# Patient Record
Sex: Female | Born: 1945 | Race: White | Hispanic: No | Smoking: Current every day smoker
Health system: Southern US, Community
[De-identification: ages and names within clinical notes are randomized; demographics above are authoritative.]

## PROBLEM LIST (undated history)

## (undated) DIAGNOSIS — G2581 Restless legs syndrome: Secondary | ICD-10-CM

## (undated) DIAGNOSIS — I2510 Atherosclerotic heart disease of native coronary artery without angina pectoris: Secondary | ICD-10-CM

## (undated) DIAGNOSIS — D689 Coagulation defect, unspecified: Secondary | ICD-10-CM

## (undated) DIAGNOSIS — I10 Essential (primary) hypertension: Secondary | ICD-10-CM

## (undated) DIAGNOSIS — Z800 Family history of malignant neoplasm of digestive organs: Secondary | ICD-10-CM

## (undated) DIAGNOSIS — R011 Cardiac murmur, unspecified: Secondary | ICD-10-CM

## (undated) DIAGNOSIS — A419 Sepsis, unspecified organism: Secondary | ICD-10-CM

## (undated) DIAGNOSIS — J449 Chronic obstructive pulmonary disease, unspecified: Secondary | ICD-10-CM

## (undated) DIAGNOSIS — G4733 Obstructive sleep apnea (adult) (pediatric): Secondary | ICD-10-CM

## (undated) DIAGNOSIS — J189 Pneumonia, unspecified organism: Secondary | ICD-10-CM

## (undated) DIAGNOSIS — I209 Angina pectoris, unspecified: Secondary | ICD-10-CM

## (undated) DIAGNOSIS — J9601 Acute respiratory failure with hypoxia: Secondary | ICD-10-CM

## (undated) DIAGNOSIS — G9340 Encephalopathy, unspecified: Secondary | ICD-10-CM

## (undated) DIAGNOSIS — M810 Age-related osteoporosis without current pathological fracture: Secondary | ICD-10-CM

## (undated) DIAGNOSIS — K2100 Gastro-esophageal reflux disease with esophagitis, without bleeding: Secondary | ICD-10-CM

## (undated) DIAGNOSIS — F419 Anxiety disorder, unspecified: Secondary | ICD-10-CM

## (undated) DIAGNOSIS — F32A Depression, unspecified: Secondary | ICD-10-CM

## (undated) DIAGNOSIS — I499 Cardiac arrhythmia, unspecified: Secondary | ICD-10-CM

## (undated) DIAGNOSIS — R0600 Dyspnea, unspecified: Secondary | ICD-10-CM

## (undated) DIAGNOSIS — A4181 Sepsis due to Enterococcus: Secondary | ICD-10-CM

## (undated) DIAGNOSIS — K2980 Duodenitis without bleeding: Secondary | ICD-10-CM

## (undated) DIAGNOSIS — K219 Gastro-esophageal reflux disease without esophagitis: Secondary | ICD-10-CM

## (undated) DIAGNOSIS — F329 Major depressive disorder, single episode, unspecified: Secondary | ICD-10-CM

## (undated) DIAGNOSIS — K5790 Diverticulosis of intestine, part unspecified, without perforation or abscess without bleeding: Secondary | ICD-10-CM

## (undated) DIAGNOSIS — R6520 Severe sepsis without septic shock: Secondary | ICD-10-CM

## (undated) DIAGNOSIS — G9341 Metabolic encephalopathy: Secondary | ICD-10-CM

## (undated) DIAGNOSIS — E785 Hyperlipidemia, unspecified: Secondary | ICD-10-CM

## (undated) DIAGNOSIS — C50919 Malignant neoplasm of unspecified site of unspecified female breast: Secondary | ICD-10-CM

## (undated) DIAGNOSIS — K648 Other hemorrhoids: Secondary | ICD-10-CM

## (undated) DIAGNOSIS — N390 Urinary tract infection, site not specified: Secondary | ICD-10-CM

## (undated) DIAGNOSIS — I219 Acute myocardial infarction, unspecified: Secondary | ICD-10-CM

## (undated) DIAGNOSIS — M1990 Unspecified osteoarthritis, unspecified site: Secondary | ICD-10-CM

## (undated) DIAGNOSIS — M8580 Other specified disorders of bone density and structure, unspecified site: Secondary | ICD-10-CM

## (undated) DIAGNOSIS — R410 Disorientation, unspecified: Secondary | ICD-10-CM

## (undated) DIAGNOSIS — Z87442 Personal history of urinary calculi: Secondary | ICD-10-CM

## (undated) DIAGNOSIS — H269 Unspecified cataract: Secondary | ICD-10-CM

## (undated) DIAGNOSIS — Z789 Other specified health status: Secondary | ICD-10-CM

## (undated) DIAGNOSIS — Z5189 Encounter for other specified aftercare: Secondary | ICD-10-CM

## (undated) DIAGNOSIS — Z923 Personal history of irradiation: Secondary | ICD-10-CM

## (undated) DIAGNOSIS — D126 Benign neoplasm of colon, unspecified: Secondary | ICD-10-CM

## (undated) DIAGNOSIS — E669 Obesity, unspecified: Secondary | ICD-10-CM

## (undated) HISTORY — DX: Hyperlipidemia, unspecified: E78.5

## (undated) HISTORY — DX: Diverticulosis of intestine, part unspecified, without perforation or abscess without bleeding: K57.90

## (undated) HISTORY — PX: CHOLECYSTECTOMY: SHX55

## (undated) HISTORY — PX: SHOULDER ARTHROSCOPY W/ ROTATOR CUFF REPAIR: SHX2400

## (undated) HISTORY — DX: Metabolic encephalopathy: G93.41

## (undated) HISTORY — PX: CARDIAC CATHETERIZATION: SHX172

## (undated) HISTORY — PX: SKIN CANCER EXCISION: SHX779

## (undated) HISTORY — PX: FOOT SURGERY: SHX648

## (undated) HISTORY — DX: Unspecified osteoarthritis, unspecified site: M19.90

## (undated) HISTORY — DX: Other hemorrhoids: K64.8

## (undated) HISTORY — DX: Essential (primary) hypertension: I10

## (undated) HISTORY — DX: Urinary tract infection, site not specified: N39.0

## (undated) HISTORY — DX: Disorientation, unspecified: R41.0

## (undated) HISTORY — DX: Family history of malignant neoplasm of digestive organs: Z80.0

## (undated) HISTORY — DX: Atherosclerotic heart disease of native coronary artery without angina pectoris: I25.10

## (undated) HISTORY — DX: Benign neoplasm of colon, unspecified: D12.6

## (undated) HISTORY — DX: Sepsis, unspecified organism: G93.40

## (undated) HISTORY — DX: Malignant neoplasm of unspecified site of unspecified female breast: C50.919

## (undated) HISTORY — DX: Severe sepsis without septic shock: R65.20

## (undated) HISTORY — PX: CORONARY ANGIOPLASTY WITH STENT PLACEMENT: SHX49

## (undated) HISTORY — DX: Gastro-esophageal reflux disease without esophagitis: K21.9

## (undated) HISTORY — PX: CARPAL TUNNEL RELEASE: SHX101

## (undated) HISTORY — DX: Obesity, unspecified: E66.9

## (undated) HISTORY — PX: FRACTURE SURGERY: SHX138

## (undated) HISTORY — DX: Gastro-esophageal reflux disease with esophagitis, without bleeding: K21.00

## (undated) HISTORY — PX: BONE GRAFT HIP ILIAC CREST: SUR159

## (undated) HISTORY — DX: Duodenitis without bleeding: K29.80

## (undated) HISTORY — DX: Depression, unspecified: F32.A

## (undated) HISTORY — DX: Coagulation defect, unspecified: D68.9

## (undated) HISTORY — DX: Gastro-esophageal reflux disease with esophagitis: K21.0

## (undated) HISTORY — DX: Other specified disorders of bone density and structure, unspecified site: M85.80

## (undated) HISTORY — DX: Unspecified cataract: H26.9

## (undated) HISTORY — DX: Sepsis, unspecified organism: A41.9

## (undated) HISTORY — DX: Age-related osteoporosis without current pathological fracture: M81.0

## (undated) HISTORY — DX: Encounter for other specified aftercare: Z51.89

## (undated) HISTORY — DX: Major depressive disorder, single episode, unspecified: F32.9

---
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
## 1970-11-18 HISTORY — PX: FEMUR FRACTURE SURGERY: SHX633

## 1980-11-18 HISTORY — PX: TUBAL LIGATION: SHX77

## 1995-11-19 DIAGNOSIS — I219 Acute myocardial infarction, unspecified: Secondary | ICD-10-CM

## 1995-11-19 HISTORY — PX: CORONARY ANGIOPLASTY: SHX604

## 1995-11-19 HISTORY — DX: Acute myocardial infarction, unspecified: I21.9

## 1998-07-25 ENCOUNTER — Ambulatory Visit (HOSPITAL_COMMUNITY): Admission: RE | Admit: 1998-07-25 | Discharge: 1998-07-25 | Payer: Self-pay | Admitting: Orthopedic Surgery

## 1998-09-27 ENCOUNTER — Encounter: Admission: RE | Admit: 1998-09-27 | Discharge: 1998-12-18 | Payer: Self-pay | Admitting: Anesthesiology

## 1998-10-02 ENCOUNTER — Ambulatory Visit (HOSPITAL_COMMUNITY): Admission: RE | Admit: 1998-10-02 | Discharge: 1998-10-02 | Payer: Self-pay | Admitting: Obstetrics and Gynecology

## 1998-12-27 ENCOUNTER — Other Ambulatory Visit: Admission: RE | Admit: 1998-12-27 | Discharge: 1998-12-27 | Payer: Self-pay | Admitting: Obstetrics and Gynecology

## 1999-01-25 ENCOUNTER — Emergency Department (HOSPITAL_COMMUNITY): Admission: EM | Admit: 1999-01-25 | Discharge: 1999-01-25 | Payer: Self-pay | Admitting: Emergency Medicine

## 1999-01-25 ENCOUNTER — Encounter: Payer: Self-pay | Admitting: Cardiovascular Disease

## 1999-09-21 ENCOUNTER — Ambulatory Visit (HOSPITAL_COMMUNITY): Admission: RE | Admit: 1999-09-21 | Discharge: 1999-09-22 | Payer: Self-pay | Admitting: Cardiovascular Disease

## 1999-12-17 ENCOUNTER — Encounter: Admission: RE | Admit: 1999-12-17 | Discharge: 1999-12-17 | Payer: Self-pay | Admitting: Orthopedic Surgery

## 1999-12-17 ENCOUNTER — Encounter: Payer: Self-pay | Admitting: Orthopedic Surgery

## 2000-03-07 ENCOUNTER — Other Ambulatory Visit: Admission: RE | Admit: 2000-03-07 | Discharge: 2000-03-07 | Payer: Self-pay | Admitting: Obstetrics and Gynecology

## 2000-04-11 ENCOUNTER — Encounter (INDEPENDENT_AMBULATORY_CARE_PROVIDER_SITE_OTHER): Payer: Self-pay | Admitting: Specialist

## 2000-04-11 ENCOUNTER — Other Ambulatory Visit: Admission: RE | Admit: 2000-04-11 | Discharge: 2000-04-11 | Payer: Self-pay | Admitting: Obstetrics and Gynecology

## 2000-07-11 ENCOUNTER — Encounter (INDEPENDENT_AMBULATORY_CARE_PROVIDER_SITE_OTHER): Payer: Self-pay

## 2000-07-11 ENCOUNTER — Other Ambulatory Visit: Admission: RE | Admit: 2000-07-11 | Discharge: 2000-07-11 | Payer: Self-pay | Admitting: Obstetrics and Gynecology

## 2001-03-13 ENCOUNTER — Encounter: Admission: RE | Admit: 2001-03-13 | Discharge: 2001-03-13 | Payer: Self-pay | Admitting: Rheumatology

## 2001-03-13 ENCOUNTER — Encounter: Payer: Self-pay | Admitting: Rheumatology

## 2001-07-07 ENCOUNTER — Encounter: Payer: Self-pay | Admitting: Cardiovascular Disease

## 2001-07-07 ENCOUNTER — Encounter: Admission: RE | Admit: 2001-07-07 | Discharge: 2001-07-07 | Payer: Self-pay | Admitting: Cardiovascular Disease

## 2001-08-05 ENCOUNTER — Encounter: Admission: RE | Admit: 2001-08-05 | Discharge: 2001-08-05 | Payer: Self-pay | Admitting: Cardiovascular Disease

## 2001-08-14 ENCOUNTER — Encounter: Payer: Self-pay | Admitting: Cardiovascular Disease

## 2001-08-14 ENCOUNTER — Encounter: Admission: RE | Admit: 2001-08-14 | Discharge: 2001-08-14 | Payer: Self-pay | Admitting: Cardiovascular Disease

## 2001-08-21 ENCOUNTER — Encounter: Payer: Self-pay | Admitting: Cardiovascular Disease

## 2001-08-21 ENCOUNTER — Encounter: Admission: RE | Admit: 2001-08-21 | Discharge: 2001-08-21 | Payer: Self-pay | Admitting: Cardiovascular Disease

## 2001-09-11 ENCOUNTER — Encounter: Payer: Self-pay | Admitting: Cardiovascular Disease

## 2001-09-11 ENCOUNTER — Encounter: Admission: RE | Admit: 2001-09-11 | Discharge: 2001-09-11 | Payer: Self-pay | Admitting: Cardiovascular Disease

## 2001-09-22 ENCOUNTER — Encounter: Admission: RE | Admit: 2001-09-22 | Discharge: 2001-09-22 | Payer: Self-pay | Admitting: Neurosurgery

## 2001-09-22 ENCOUNTER — Encounter: Payer: Self-pay | Admitting: Neurosurgery

## 2002-05-03 ENCOUNTER — Encounter: Payer: Self-pay | Admitting: Obstetrics and Gynecology

## 2002-05-03 ENCOUNTER — Encounter: Admission: RE | Admit: 2002-05-03 | Discharge: 2002-05-03 | Payer: Self-pay | Admitting: Obstetrics and Gynecology

## 2002-06-01 ENCOUNTER — Encounter: Payer: Self-pay | Admitting: Internal Medicine

## 2002-06-01 ENCOUNTER — Ambulatory Visit (HOSPITAL_COMMUNITY): Admission: RE | Admit: 2002-06-01 | Discharge: 2002-06-01 | Payer: Self-pay | Admitting: Internal Medicine

## 2002-06-01 DIAGNOSIS — K2100 Gastro-esophageal reflux disease with esophagitis, without bleeding: Secondary | ICD-10-CM | POA: Insufficient documentation

## 2002-06-01 DIAGNOSIS — K222 Esophageal obstruction: Secondary | ICD-10-CM | POA: Insufficient documentation

## 2002-06-01 DIAGNOSIS — K2980 Duodenitis without bleeding: Secondary | ICD-10-CM | POA: Insufficient documentation

## 2002-06-07 ENCOUNTER — Encounter: Payer: Self-pay | Admitting: Internal Medicine

## 2002-07-01 ENCOUNTER — Encounter: Payer: Self-pay | Admitting: Cardiovascular Disease

## 2002-07-01 ENCOUNTER — Observation Stay (HOSPITAL_COMMUNITY): Admission: AD | Admit: 2002-07-01 | Discharge: 2002-07-03 | Payer: Self-pay | Admitting: Cardiovascular Disease

## 2002-07-02 ENCOUNTER — Encounter (INDEPENDENT_AMBULATORY_CARE_PROVIDER_SITE_OTHER): Payer: Self-pay | Admitting: Cardiovascular Disease

## 2002-07-02 ENCOUNTER — Encounter: Payer: Self-pay | Admitting: Cardiovascular Disease

## 2002-09-15 ENCOUNTER — Encounter: Payer: Self-pay | Admitting: Cardiovascular Disease

## 2002-09-15 ENCOUNTER — Encounter: Admission: RE | Admit: 2002-09-15 | Discharge: 2002-09-15 | Payer: Self-pay | Admitting: Cardiovascular Disease

## 2002-11-18 HISTORY — PX: APPENDECTOMY: SHX54

## 2003-02-18 ENCOUNTER — Encounter: Payer: Self-pay | Admitting: Emergency Medicine

## 2003-02-19 ENCOUNTER — Encounter: Payer: Self-pay | Admitting: Emergency Medicine

## 2003-02-19 ENCOUNTER — Encounter (INDEPENDENT_AMBULATORY_CARE_PROVIDER_SITE_OTHER): Payer: Self-pay | Admitting: Specialist

## 2003-02-19 ENCOUNTER — Inpatient Hospital Stay (HOSPITAL_COMMUNITY): Admission: EM | Admit: 2003-02-19 | Discharge: 2003-02-19 | Payer: Self-pay

## 2003-02-19 ENCOUNTER — Emergency Department (HOSPITAL_COMMUNITY): Admission: EM | Admit: 2003-02-19 | Discharge: 2003-02-19 | Payer: Self-pay | Admitting: Emergency Medicine

## 2003-03-22 ENCOUNTER — Inpatient Hospital Stay (HOSPITAL_COMMUNITY): Admission: RE | Admit: 2003-03-22 | Discharge: 2003-03-23 | Payer: Self-pay | Admitting: Neurosurgery

## 2003-03-22 ENCOUNTER — Encounter: Payer: Self-pay | Admitting: Neurosurgery

## 2003-07-29 ENCOUNTER — Ambulatory Visit: Admission: RE | Admit: 2003-07-29 | Discharge: 2003-07-29 | Payer: Self-pay | Admitting: Neurosurgery

## 2003-07-29 ENCOUNTER — Encounter: Payer: Self-pay | Admitting: Neurosurgery

## 2003-08-23 ENCOUNTER — Ambulatory Visit (HOSPITAL_COMMUNITY): Admission: RE | Admit: 2003-08-23 | Discharge: 2003-08-23 | Payer: Self-pay | Admitting: Orthopaedic Surgery

## 2003-08-23 ENCOUNTER — Observation Stay (HOSPITAL_COMMUNITY): Admission: AD | Admit: 2003-08-23 | Discharge: 2003-08-24 | Payer: Self-pay | Admitting: Orthopaedic Surgery

## 2003-08-23 ENCOUNTER — Ambulatory Visit (HOSPITAL_BASED_OUTPATIENT_CLINIC_OR_DEPARTMENT_OTHER): Admission: RE | Admit: 2003-08-23 | Discharge: 2003-08-23 | Payer: Self-pay | Admitting: Orthopaedic Surgery

## 2003-08-30 ENCOUNTER — Encounter: Payer: Self-pay | Admitting: Radiology

## 2003-08-30 ENCOUNTER — Encounter: Admission: RE | Admit: 2003-08-30 | Discharge: 2003-08-30 | Payer: Self-pay | Admitting: Neurosurgery

## 2003-08-30 ENCOUNTER — Encounter: Payer: Self-pay | Admitting: Neurosurgery

## 2003-09-15 ENCOUNTER — Encounter: Admission: RE | Admit: 2003-09-15 | Discharge: 2003-09-15 | Payer: Self-pay | Admitting: Neurosurgery

## 2003-09-27 ENCOUNTER — Encounter: Admission: RE | Admit: 2003-09-27 | Discharge: 2003-09-27 | Payer: Self-pay | Admitting: Obstetrics and Gynecology

## 2003-11-19 HISTORY — PX: LUMBAR LAMINECTOMY: SHX95

## 2003-12-01 ENCOUNTER — Ambulatory Visit (HOSPITAL_COMMUNITY): Admission: RE | Admit: 2003-12-01 | Discharge: 2003-12-01 | Payer: Self-pay | Admitting: Neurosurgery

## 2003-12-19 ENCOUNTER — Other Ambulatory Visit: Admission: RE | Admit: 2003-12-19 | Discharge: 2003-12-19 | Payer: Self-pay | Admitting: Obstetrics and Gynecology

## 2004-01-24 ENCOUNTER — Inpatient Hospital Stay (HOSPITAL_COMMUNITY): Admission: RE | Admit: 2004-01-24 | Discharge: 2004-01-31 | Payer: Self-pay | Admitting: Neurosurgery

## 2004-02-25 ENCOUNTER — Inpatient Hospital Stay (HOSPITAL_COMMUNITY): Admission: EM | Admit: 2004-02-25 | Discharge: 2004-03-02 | Payer: Self-pay | Admitting: Emergency Medicine

## 2004-03-12 ENCOUNTER — Emergency Department (HOSPITAL_COMMUNITY): Admission: EM | Admit: 2004-03-12 | Discharge: 2004-03-12 | Payer: Self-pay | Admitting: Emergency Medicine

## 2004-06-12 ENCOUNTER — Ambulatory Visit (HOSPITAL_BASED_OUTPATIENT_CLINIC_OR_DEPARTMENT_OTHER): Admission: RE | Admit: 2004-06-12 | Discharge: 2004-06-12 | Payer: Self-pay | Admitting: Orthopaedic Surgery

## 2004-06-12 ENCOUNTER — Ambulatory Visit (HOSPITAL_COMMUNITY): Admission: RE | Admit: 2004-06-12 | Discharge: 2004-06-12 | Payer: Self-pay | Admitting: Orthopaedic Surgery

## 2004-10-22 ENCOUNTER — Encounter: Admission: RE | Admit: 2004-10-22 | Discharge: 2004-10-22 | Payer: Self-pay | Admitting: Obstetrics and Gynecology

## 2004-11-18 HISTORY — PX: CERVICAL FUSION: SHX112

## 2004-12-10 ENCOUNTER — Encounter: Admission: RE | Admit: 2004-12-10 | Discharge: 2004-12-10 | Payer: Self-pay | Admitting: Cardiovascular Disease

## 2004-12-24 ENCOUNTER — Other Ambulatory Visit: Admission: RE | Admit: 2004-12-24 | Discharge: 2004-12-24 | Payer: Self-pay | Admitting: Obstetrics and Gynecology

## 2004-12-26 ENCOUNTER — Encounter: Admission: RE | Admit: 2004-12-26 | Discharge: 2004-12-26 | Payer: Self-pay | Admitting: Obstetrics and Gynecology

## 2005-04-18 ENCOUNTER — Ambulatory Visit (HOSPITAL_COMMUNITY): Admission: RE | Admit: 2005-04-18 | Discharge: 2005-04-18 | Payer: Self-pay | Admitting: Cardiovascular Disease

## 2005-04-19 ENCOUNTER — Emergency Department (HOSPITAL_COMMUNITY): Admission: EM | Admit: 2005-04-19 | Discharge: 2005-04-19 | Payer: Self-pay | Admitting: Emergency Medicine

## 2005-08-23 ENCOUNTER — Ambulatory Visit: Payer: Self-pay | Admitting: Internal Medicine

## 2005-09-05 ENCOUNTER — Ambulatory Visit: Payer: Self-pay | Admitting: Internal Medicine

## 2005-09-05 DIAGNOSIS — K5730 Diverticulosis of large intestine without perforation or abscess without bleeding: Secondary | ICD-10-CM | POA: Insufficient documentation

## 2005-12-25 ENCOUNTER — Other Ambulatory Visit: Admission: RE | Admit: 2005-12-25 | Discharge: 2005-12-25 | Payer: Self-pay | Admitting: Obstetrics and Gynecology

## 2006-07-18 ENCOUNTER — Encounter: Admission: RE | Admit: 2006-07-18 | Discharge: 2006-07-18 | Payer: Self-pay | Admitting: Obstetrics and Gynecology

## 2006-11-18 DIAGNOSIS — Z923 Personal history of irradiation: Secondary | ICD-10-CM

## 2006-11-18 DIAGNOSIS — C50919 Malignant neoplasm of unspecified site of unspecified female breast: Secondary | ICD-10-CM

## 2006-11-18 HISTORY — DX: Personal history of irradiation: Z92.3

## 2006-11-18 HISTORY — PX: BREAST BIOPSY: SHX20

## 2006-11-18 HISTORY — DX: Malignant neoplasm of unspecified site of unspecified female breast: C50.919

## 2006-11-18 HISTORY — PX: BREAST LUMPECTOMY: SHX2

## 2007-04-07 ENCOUNTER — Other Ambulatory Visit: Admission: RE | Admit: 2007-04-07 | Discharge: 2007-04-07 | Payer: Self-pay | Admitting: Obstetrics and Gynecology

## 2007-04-24 ENCOUNTER — Encounter (INDEPENDENT_AMBULATORY_CARE_PROVIDER_SITE_OTHER): Payer: Self-pay | Admitting: Dermatology

## 2007-04-30 ENCOUNTER — Encounter: Admission: RE | Admit: 2007-04-30 | Discharge: 2007-04-30 | Payer: Self-pay | Admitting: Obstetrics and Gynecology

## 2007-04-30 ENCOUNTER — Encounter (INDEPENDENT_AMBULATORY_CARE_PROVIDER_SITE_OTHER): Payer: Self-pay | Admitting: Radiology

## 2007-05-03 ENCOUNTER — Encounter: Admission: RE | Admit: 2007-05-03 | Discharge: 2007-05-03 | Payer: Self-pay | Admitting: Obstetrics and Gynecology

## 2007-05-08 ENCOUNTER — Ambulatory Visit (HOSPITAL_COMMUNITY): Admission: RE | Admit: 2007-05-08 | Discharge: 2007-05-08 | Payer: Self-pay | Admitting: Cardiovascular Disease

## 2007-05-11 ENCOUNTER — Encounter: Admission: RE | Admit: 2007-05-11 | Discharge: 2007-05-11 | Payer: Self-pay | Admitting: General Surgery

## 2007-05-21 ENCOUNTER — Ambulatory Visit (HOSPITAL_COMMUNITY): Admission: RE | Admit: 2007-05-21 | Discharge: 2007-05-22 | Payer: Self-pay | Admitting: Surgery

## 2007-05-21 ENCOUNTER — Encounter (INDEPENDENT_AMBULATORY_CARE_PROVIDER_SITE_OTHER): Payer: Self-pay | Admitting: Surgery

## 2007-05-25 ENCOUNTER — Ambulatory Visit: Payer: Self-pay | Admitting: Oncology

## 2007-06-02 ENCOUNTER — Ambulatory Visit: Admission: RE | Admit: 2007-06-02 | Discharge: 2007-08-24 | Payer: Self-pay | Admitting: Radiation Oncology

## 2007-06-03 LAB — LACTATE DEHYDROGENASE: LDH: 138 U/L (ref 94–250)

## 2007-06-03 LAB — CBC WITH DIFFERENTIAL/PLATELET
EOS%: 4.5 % (ref 0.0–7.0)
HCT: 39.6 % (ref 34.8–46.6)
MCH: 31.2 pg (ref 26.0–34.0)
MCHC: 35 g/dL (ref 32.0–36.0)
NEUT%: 67.9 % (ref 39.6–76.8)
RBC: 4.44 10*6/uL (ref 3.70–5.32)
RDW: 12.1 % (ref 11.3–14.5)
lymph#: 2.2 10*3/uL (ref 0.9–3.3)

## 2007-06-03 LAB — COMPREHENSIVE METABOLIC PANEL
Albumin: 3.6 g/dL (ref 3.5–5.2)
Alkaline Phosphatase: 98 U/L (ref 39–117)
BUN: 16 mg/dL (ref 6–23)
CO2: 23 mEq/L (ref 19–32)
Glucose, Bld: 98 mg/dL (ref 70–99)
Potassium: 4.2 mEq/L (ref 3.5–5.3)
Total Bilirubin: 0.4 mg/dL (ref 0.3–1.2)

## 2007-06-03 LAB — CANCER ANTIGEN 27.29: CA 27.29: 12 U/mL (ref 0–39)

## 2007-06-05 ENCOUNTER — Ambulatory Visit (HOSPITAL_COMMUNITY): Admission: RE | Admit: 2007-06-05 | Discharge: 2007-06-05 | Payer: Self-pay | Admitting: Oncology

## 2007-06-10 ENCOUNTER — Ambulatory Visit (HOSPITAL_COMMUNITY): Admission: RE | Admit: 2007-06-10 | Discharge: 2007-06-10 | Payer: Self-pay | Admitting: Oncology

## 2007-08-24 ENCOUNTER — Ambulatory Visit: Admission: RE | Admit: 2007-08-24 | Discharge: 2007-10-27 | Payer: Self-pay | Admitting: Radiation Oncology

## 2007-09-10 ENCOUNTER — Ambulatory Visit: Payer: Self-pay | Admitting: Oncology

## 2007-09-10 LAB — CBC WITH DIFFERENTIAL/PLATELET
BASO%: 1.2 % (ref 0.0–2.0)
HCT: 40.1 % (ref 34.8–46.6)
MCHC: 34.8 g/dL (ref 32.0–36.0)
MONO#: 0.5 10*3/uL (ref 0.1–0.9)
NEUT%: 65.7 % (ref 39.6–76.8)
RDW: 14.4 % (ref 11.3–14.5)
WBC: 9.8 10*3/uL (ref 3.9–10.0)
lymph#: 1.9 10*3/uL (ref 0.9–3.3)

## 2007-09-10 LAB — COMPREHENSIVE METABOLIC PANEL
ALT: 12 U/L (ref 0–35)
AST: 15 U/L (ref 0–37)
CO2: 24 mEq/L (ref 19–32)
Creatinine, Ser: 0.68 mg/dL (ref 0.40–1.20)
Sodium: 140 mEq/L (ref 135–145)
Total Bilirubin: 0.2 mg/dL — ABNORMAL LOW (ref 0.3–1.2)
Total Protein: 5.9 g/dL — ABNORMAL LOW (ref 6.0–8.3)

## 2007-10-13 LAB — LACTATE DEHYDROGENASE: LDH: 143 U/L (ref 94–250)

## 2007-10-13 LAB — COMPREHENSIVE METABOLIC PANEL
Albumin: 3.5 g/dL (ref 3.5–5.2)
BUN: 15 mg/dL (ref 6–23)
CO2: 19 mEq/L (ref 19–32)
Calcium: 8.8 mg/dL (ref 8.4–10.5)
Chloride: 108 mEq/L (ref 96–112)
Creatinine, Ser: 0.7 mg/dL (ref 0.40–1.20)
Potassium: 4.1 mEq/L (ref 3.5–5.3)

## 2007-10-13 LAB — CBC WITH DIFFERENTIAL/PLATELET
Basophils Absolute: 0.1 10*3/uL (ref 0.0–0.1)
Eosinophils Absolute: 0.8 10*3/uL — ABNORMAL HIGH (ref 0.0–0.5)
HCT: 40.2 % (ref 34.8–46.6)
HGB: 14 g/dL (ref 11.6–15.9)
MCH: 31.4 pg (ref 26.0–34.0)
MONO#: 0.4 10*3/uL (ref 0.1–0.9)
NEUT#: 6.7 10*3/uL — ABNORMAL HIGH (ref 1.5–6.5)
NEUT%: 67.1 % (ref 39.6–76.8)
RDW: 14.2 % (ref 11.3–14.5)
WBC: 10 10*3/uL (ref 3.9–10.0)
lymph#: 2 10*3/uL (ref 0.9–3.3)

## 2007-10-18 LAB — VITAMIN D PNL(25-HYDRXY+1,25-DIHY)-BLD: Vit D, 25-Hydroxy: 26 ng/mL — ABNORMAL LOW (ref 30–89)

## 2007-10-28 ENCOUNTER — Ambulatory Visit: Payer: Self-pay | Admitting: Internal Medicine

## 2007-11-17 DIAGNOSIS — K319 Disease of stomach and duodenum, unspecified: Secondary | ICD-10-CM | POA: Insufficient documentation

## 2007-11-17 DIAGNOSIS — E669 Obesity, unspecified: Secondary | ICD-10-CM | POA: Insufficient documentation

## 2007-11-17 DIAGNOSIS — E785 Hyperlipidemia, unspecified: Secondary | ICD-10-CM | POA: Insufficient documentation

## 2007-11-17 DIAGNOSIS — F329 Major depressive disorder, single episode, unspecified: Secondary | ICD-10-CM

## 2007-11-17 DIAGNOSIS — G4730 Sleep apnea, unspecified: Secondary | ICD-10-CM

## 2007-11-17 DIAGNOSIS — D126 Benign neoplasm of colon, unspecified: Secondary | ICD-10-CM | POA: Insufficient documentation

## 2007-11-17 DIAGNOSIS — Z853 Personal history of malignant neoplasm of breast: Secondary | ICD-10-CM | POA: Insufficient documentation

## 2007-11-17 DIAGNOSIS — I2510 Atherosclerotic heart disease of native coronary artery without angina pectoris: Secondary | ICD-10-CM | POA: Insufficient documentation

## 2007-11-17 DIAGNOSIS — K648 Other hemorrhoids: Secondary | ICD-10-CM | POA: Insufficient documentation

## 2007-11-17 DIAGNOSIS — K219 Gastro-esophageal reflux disease without esophagitis: Secondary | ICD-10-CM | POA: Insufficient documentation

## 2007-11-17 DIAGNOSIS — D0590 Unspecified type of carcinoma in situ of unspecified breast: Secondary | ICD-10-CM

## 2007-11-17 DIAGNOSIS — I10 Essential (primary) hypertension: Secondary | ICD-10-CM | POA: Insufficient documentation

## 2007-11-17 DIAGNOSIS — G4733 Obstructive sleep apnea (adult) (pediatric): Secondary | ICD-10-CM | POA: Insufficient documentation

## 2007-11-17 DIAGNOSIS — F32A Depression, unspecified: Secondary | ICD-10-CM | POA: Insufficient documentation

## 2007-11-17 HISTORY — DX: Personal history of malignant neoplasm of breast: Z85.3

## 2008-01-05 ENCOUNTER — Ambulatory Visit: Payer: Self-pay | Admitting: Internal Medicine

## 2008-01-10 ENCOUNTER — Emergency Department (HOSPITAL_COMMUNITY): Admission: EM | Admit: 2008-01-10 | Discharge: 2008-01-10 | Payer: Self-pay | Admitting: Emergency Medicine

## 2008-01-10 ENCOUNTER — Encounter (INDEPENDENT_AMBULATORY_CARE_PROVIDER_SITE_OTHER): Payer: Self-pay | Admitting: *Deleted

## 2008-01-11 ENCOUNTER — Ambulatory Visit: Payer: Self-pay | Admitting: Oncology

## 2008-01-13 LAB — CBC WITH DIFFERENTIAL/PLATELET
Basophils Absolute: 0.1 10*3/uL (ref 0.0–0.1)
Eosinophils Absolute: 0.4 10*3/uL (ref 0.0–0.5)
HGB: 13.6 g/dL (ref 11.6–15.9)
MCV: 89.4 fL (ref 81.0–101.0)
MONO#: 0.6 10*3/uL (ref 0.1–0.9)
MONO%: 4 % (ref 0.0–13.0)
NEUT#: 11 10*3/uL — ABNORMAL HIGH (ref 1.5–6.5)
RDW: 13.9 % (ref 11.3–14.5)
WBC: 14 10*3/uL — ABNORMAL HIGH (ref 3.9–10.0)

## 2008-01-14 LAB — VITAMIN D 25 HYDROXY (VIT D DEFICIENCY, FRACTURES): Vit D, 25-Hydroxy: 35 ng/mL (ref 30–89)

## 2008-01-14 LAB — COMPREHENSIVE METABOLIC PANEL
Albumin: 3.8 g/dL (ref 3.5–5.2)
BUN: 14 mg/dL (ref 6–23)
CO2: 23 mEq/L (ref 19–32)
Calcium: 9.1 mg/dL (ref 8.4–10.5)
Chloride: 106 mEq/L (ref 96–112)
Glucose, Bld: 104 mg/dL — ABNORMAL HIGH (ref 70–99)
Potassium: 4.3 mEq/L (ref 3.5–5.3)
Total Protein: 6.2 g/dL (ref 6.0–8.3)

## 2008-01-14 LAB — CANCER ANTIGEN 27.29: CA 27.29: 12 U/mL (ref 0–39)

## 2008-01-14 LAB — LACTATE DEHYDROGENASE: LDH: 140 U/L (ref 94–250)

## 2008-01-19 ENCOUNTER — Ambulatory Visit: Payer: Self-pay | Admitting: Internal Medicine

## 2008-01-19 LAB — CONVERTED CEMR LAB
Albumin: 3.1 g/dL — ABNORMAL LOW (ref 3.5–5.2)
Alkaline Phosphatase: 99 units/L (ref 39–117)

## 2008-01-25 ENCOUNTER — Ambulatory Visit: Payer: Self-pay | Admitting: Internal Medicine

## 2008-02-22 ENCOUNTER — Encounter (INDEPENDENT_AMBULATORY_CARE_PROVIDER_SITE_OTHER): Payer: Self-pay | Admitting: Surgery

## 2008-02-22 ENCOUNTER — Ambulatory Visit (HOSPITAL_COMMUNITY): Admission: RE | Admit: 2008-02-22 | Discharge: 2008-02-22 | Payer: Self-pay | Admitting: Surgery

## 2008-02-22 ENCOUNTER — Encounter (INDEPENDENT_AMBULATORY_CARE_PROVIDER_SITE_OTHER): Payer: Self-pay | Admitting: *Deleted

## 2008-04-12 ENCOUNTER — Other Ambulatory Visit: Admission: RE | Admit: 2008-04-12 | Discharge: 2008-04-12 | Payer: Self-pay | Admitting: Obstetrics and Gynecology

## 2008-04-14 ENCOUNTER — Emergency Department (HOSPITAL_COMMUNITY): Admission: EM | Admit: 2008-04-14 | Discharge: 2008-04-14 | Payer: Self-pay | Admitting: Emergency Medicine

## 2008-05-02 ENCOUNTER — Encounter: Admission: RE | Admit: 2008-05-02 | Discharge: 2008-05-02 | Payer: Self-pay | Admitting: Oncology

## 2008-05-23 ENCOUNTER — Telehealth: Payer: Self-pay | Admitting: Internal Medicine

## 2008-05-24 ENCOUNTER — Telehealth (INDEPENDENT_AMBULATORY_CARE_PROVIDER_SITE_OTHER): Payer: Self-pay | Admitting: *Deleted

## 2008-06-12 ENCOUNTER — Ambulatory Visit: Payer: Self-pay | Admitting: Oncology

## 2008-06-17 LAB — CBC WITH DIFFERENTIAL/PLATELET
Basophils Absolute: 0 10*3/uL (ref 0.0–0.1)
Eosinophils Absolute: 0.2 10*3/uL (ref 0.0–0.5)
HGB: 13 g/dL (ref 11.6–15.9)
NEUT#: 7.1 10*3/uL — ABNORMAL HIGH (ref 1.5–6.5)
RDW: 14.1 % (ref 11.3–14.5)
lymph#: 1.6 10*3/uL (ref 0.9–3.3)

## 2008-06-20 LAB — COMPREHENSIVE METABOLIC PANEL
AST: 354 U/L — ABNORMAL HIGH (ref 0–37)
Albumin: 3.7 g/dL (ref 3.5–5.2)
BUN: 12 mg/dL (ref 6–23)
Calcium: 8.6 mg/dL (ref 8.4–10.5)
Chloride: 106 mEq/L (ref 96–112)
Glucose, Bld: 119 mg/dL — ABNORMAL HIGH (ref 70–99)
Potassium: 4.3 mEq/L (ref 3.5–5.3)

## 2008-06-20 LAB — VITAMIN D 25 HYDROXY (VIT D DEFICIENCY, FRACTURES): Vit D, 25-Hydroxy: 34 ng/mL (ref 30–89)

## 2008-06-24 ENCOUNTER — Encounter: Payer: Self-pay | Admitting: Internal Medicine

## 2008-06-24 ENCOUNTER — Encounter (INDEPENDENT_AMBULATORY_CARE_PROVIDER_SITE_OTHER): Payer: Self-pay | Admitting: *Deleted

## 2008-06-24 ENCOUNTER — Ambulatory Visit (HOSPITAL_COMMUNITY): Admission: RE | Admit: 2008-06-24 | Discharge: 2008-06-24 | Payer: Self-pay | Admitting: Oncology

## 2008-07-15 ENCOUNTER — Encounter: Payer: Self-pay | Admitting: Internal Medicine

## 2008-07-19 ENCOUNTER — Ambulatory Visit: Payer: Self-pay | Admitting: Internal Medicine

## 2008-07-19 DIAGNOSIS — R740 Nonspecific elevation of levels of transaminase and lactic acid dehydrogenase [LDH]: Secondary | ICD-10-CM

## 2008-07-19 DIAGNOSIS — Z860100 Personal history of colon polyps, unspecified: Secondary | ICD-10-CM

## 2008-07-19 DIAGNOSIS — R7401 Elevation of levels of liver transaminase levels: Secondary | ICD-10-CM | POA: Insufficient documentation

## 2008-07-19 DIAGNOSIS — K259 Gastric ulcer, unspecified as acute or chronic, without hemorrhage or perforation: Secondary | ICD-10-CM | POA: Insufficient documentation

## 2008-07-19 HISTORY — DX: Personal history of colon polyps, unspecified: Z86.0100

## 2008-07-19 HISTORY — DX: Personal history of colonic polyps: Z86.010

## 2008-07-19 LAB — CONVERTED CEMR LAB
A-1 Antitrypsin, Ser: 188 mg/dL (ref 83–200)
ALT: 12 units/L (ref 0–35)
Albumin: 3.1 g/dL — ABNORMAL LOW (ref 3.5–5.2)
Alkaline Phosphatase: 101 units/L (ref 39–117)
Anti Nuclear Antibody(ANA): NEGATIVE
BUN: 17 mg/dL (ref 6–23)
CO2: 29 meq/L (ref 19–32)
Calcium: 8.7 mg/dL (ref 8.4–10.5)
Eosinophils Relative: 5.9 % — ABNORMAL HIGH (ref 0.0–5.0)
Ferritin: 9.9 ng/mL — ABNORMAL LOW (ref 10.0–291.0)
GFR calc Af Amer: 108 mL/min
Glucose, Bld: 95 mg/dL (ref 70–99)
HCT: 39.2 % (ref 36.0–46.0)
Hemoglobin: 13.4 g/dL (ref 12.0–15.0)
INR: 1 (ref 0.8–1.0)
Monocytes Absolute: 0.6 10*3/uL (ref 0.1–1.0)
Monocytes Relative: 5.3 % (ref 3.0–12.0)
Neutro Abs: 8.3 10*3/uL — ABNORMAL HIGH (ref 1.4–7.7)
RBC: 4.46 M/uL (ref 3.87–5.11)
TSH: 1 microintl units/mL (ref 0.35–5.50)
Total Protein: 6.2 g/dL (ref 6.0–8.3)
WBC: 12.2 10*3/uL — ABNORMAL HIGH (ref 4.5–10.5)

## 2008-07-22 ENCOUNTER — Ambulatory Visit: Payer: Self-pay | Admitting: Internal Medicine

## 2008-09-05 ENCOUNTER — Ambulatory Visit: Payer: Self-pay | Admitting: Oncology

## 2008-09-07 ENCOUNTER — Encounter: Payer: Self-pay | Admitting: Internal Medicine

## 2008-09-07 LAB — CBC WITH DIFFERENTIAL/PLATELET
Eosinophils Absolute: 0.4 10*3/uL (ref 0.0–0.5)
MCV: 85.1 fL (ref 81.0–101.0)
MONO%: 5.1 % (ref 0.0–13.0)
NEUT#: 8.2 10*3/uL — ABNORMAL HIGH (ref 1.5–6.5)
RBC: 4.12 10*6/uL (ref 3.70–5.32)
RDW: 15.2 % — ABNORMAL HIGH (ref 11.3–14.5)

## 2008-09-08 LAB — CMP AND LIVER
Albumin: 3.8 g/dL (ref 3.5–5.2)
BUN: 16 mg/dL (ref 6–23)
Bilirubin, Direct: <0.1 mg/dL (ref 0.0–0.3)
CO2: 21 mEq/L (ref 19–32)
Calcium: 9.2 mg/dL (ref 8.4–10.5)
Chloride: 109 mEq/L (ref 96–112)
Creatinine, Ser: 0.65 mg/dL (ref 0.40–1.20)
Glucose, Bld: 120 mg/dL — ABNORMAL HIGH (ref 70–99)

## 2008-09-08 LAB — VITAMIN D 25 HYDROXY (VIT D DEFICIENCY, FRACTURES): Vit D, 25-Hydroxy: 43 ng/mL (ref 30–89)

## 2008-09-08 LAB — GAMMA GT: GGT: 168 U/L — ABNORMAL HIGH (ref 7–51)

## 2008-09-08 LAB — CANCER ANTIGEN 27.29: CA 27.29: 11 U/mL (ref 0–39)

## 2008-09-08 LAB — LACTATE DEHYDROGENASE: LDH: 116 U/L (ref 94–250)

## 2008-10-21 ENCOUNTER — Ambulatory Visit: Payer: Self-pay | Admitting: Oncology

## 2008-10-21 LAB — HEPATIC FUNCTION PANEL
Albumin: 3.7 g/dL (ref 3.5–5.2)
Alkaline Phosphatase: 106 U/L (ref 39–117)
Total Protein: 6.1 g/dL (ref 6.0–8.3)

## 2008-11-17 ENCOUNTER — Ambulatory Visit: Payer: Self-pay | Admitting: Internal Medicine

## 2008-11-17 DIAGNOSIS — K589 Irritable bowel syndrome without diarrhea: Secondary | ICD-10-CM | POA: Insufficient documentation

## 2009-01-03 ENCOUNTER — Encounter (INDEPENDENT_AMBULATORY_CARE_PROVIDER_SITE_OTHER): Payer: Self-pay | Admitting: *Deleted

## 2009-01-23 ENCOUNTER — Ambulatory Visit: Payer: Self-pay | Admitting: Internal Medicine

## 2009-02-10 ENCOUNTER — Ambulatory Visit: Payer: Self-pay | Admitting: Oncology

## 2009-02-14 LAB — CBC WITH DIFFERENTIAL/PLATELET
BASO%: 0.4 % (ref 0.0–2.0)
Eosinophils Absolute: 0.3 10*3/uL (ref 0.0–0.5)
HCT: 33.2 % — ABNORMAL LOW (ref 34.8–46.6)
LYMPH%: 32.8 % (ref 14.0–49.7)
MCHC: 32.4 g/dL (ref 31.5–36.0)
MCV: 80.9 fL (ref 79.5–101.0)
MONO%: 6.1 % (ref 0.0–14.0)
NEUT%: 57.3 % (ref 38.4–76.8)
Platelets: 403 10*3/uL — ABNORMAL HIGH (ref 145–400)
RBC: 4.1 10*6/uL (ref 3.70–5.45)

## 2009-02-15 LAB — CANCER ANTIGEN 27.29: CA 27.29: 16 U/mL (ref 0–39)

## 2009-02-15 LAB — COMPREHENSIVE METABOLIC PANEL
ALT: <8 U/L (ref 0–35)
BUN: 14 mg/dL (ref 6–23)
CO2: 21 mEq/L (ref 19–32)
Calcium: 8.7 mg/dL (ref 8.4–10.5)
Chloride: 111 mEq/L (ref 96–112)
Creatinine, Ser: 0.74 mg/dL (ref 0.40–1.20)
Total Bilirubin: 0.2 mg/dL — ABNORMAL LOW (ref 0.3–1.2)

## 2009-02-15 LAB — VITAMIN D 25 HYDROXY (VIT D DEFICIENCY, FRACTURES): Vit D, 25-Hydroxy: 46 ng/mL (ref 30–89)

## 2009-02-15 LAB — LACTATE DEHYDROGENASE: LDH: 118 U/L (ref 94–250)

## 2009-03-09 ENCOUNTER — Encounter: Payer: Self-pay | Admitting: Internal Medicine

## 2009-03-14 LAB — CBC & DIFF AND RETIC
Basophils Absolute: 0 10*3/uL (ref 0.0–0.1)
Eosinophils Absolute: 0.8 10*3/uL — ABNORMAL HIGH (ref 0.0–0.5)
HGB: 10.9 g/dL — ABNORMAL LOW (ref 11.6–15.9)
IRF: 0.43 — ABNORMAL HIGH (ref 0.130–0.330)
MONO#: 0.5 10*3/uL (ref 0.1–0.9)
NEUT#: 6.2 10*3/uL (ref 1.5–6.5)
RDW: 17.1 % — ABNORMAL HIGH (ref 11.2–14.5)
RETIC #: 75.5 10*3/uL (ref 19.7–115.1)
Retic %: 1.8 % (ref 0.4–2.3)
WBC: 9.7 10*3/uL (ref 3.9–10.3)
lymph#: 2.2 10*3/uL (ref 0.9–3.3)

## 2009-03-14 LAB — IRON AND TIBC
Iron: 15 ug/dL — ABNORMAL LOW (ref 42–145)
UIBC: 307 ug/dL

## 2009-03-14 LAB — MORPHOLOGY: PLT EST: ADEQUATE

## 2009-03-14 LAB — CHCC SMEAR

## 2009-05-03 ENCOUNTER — Encounter: Admission: RE | Admit: 2009-05-03 | Discharge: 2009-05-03 | Payer: Self-pay | Admitting: Oncology

## 2009-05-04 ENCOUNTER — Ambulatory Visit (HOSPITAL_COMMUNITY): Admission: RE | Admit: 2009-05-04 | Discharge: 2009-05-04 | Payer: Self-pay | Admitting: Cardiovascular Disease

## 2009-07-19 ENCOUNTER — Telehealth: Payer: Self-pay | Admitting: Internal Medicine

## 2009-07-19 ENCOUNTER — Ambulatory Visit: Payer: Self-pay | Admitting: Gastroenterology

## 2009-07-19 DIAGNOSIS — F411 Generalized anxiety disorder: Secondary | ICD-10-CM

## 2009-07-19 DIAGNOSIS — M1990 Unspecified osteoarthritis, unspecified site: Secondary | ICD-10-CM | POA: Insufficient documentation

## 2009-07-19 DIAGNOSIS — R1011 Right upper quadrant pain: Secondary | ICD-10-CM | POA: Insufficient documentation

## 2009-07-19 DIAGNOSIS — F419 Anxiety disorder, unspecified: Secondary | ICD-10-CM | POA: Insufficient documentation

## 2009-07-19 DIAGNOSIS — R110 Nausea: Secondary | ICD-10-CM

## 2009-07-19 HISTORY — DX: Nausea: R11.0

## 2009-07-20 LAB — CONVERTED CEMR LAB
ALT: 11 units/L (ref 0–35)
AST: 17 units/L (ref 0–37)
Albumin: 3.3 g/dL — ABNORMAL LOW (ref 3.5–5.2)
Alkaline Phosphatase: 75 units/L (ref 39–117)
Basophils Absolute: 0.1 10*3/uL (ref 0.0–0.1)
Eosinophils Absolute: 0.4 10*3/uL (ref 0.0–0.7)
Glucose, Bld: 83 mg/dL (ref 70–99)
HCT: 36.7 % (ref 36.0–46.0)
Lipase: 18 units/L (ref 11.0–59.0)
Lymphs Abs: 2.5 10*3/uL (ref 0.7–4.0)
MCHC: 33.5 g/dL (ref 30.0–36.0)
MCV: 90.1 fL (ref 78.0–100.0)
Monocytes Absolute: 0.6 10*3/uL (ref 0.1–1.0)
Neutrophils Relative %: 58.3 % (ref 43.0–77.0)
Platelets: 260 10*3/uL (ref 150.0–400.0)
Potassium: 3.9 meq/L (ref 3.5–5.1)
RDW: 14.3 % (ref 11.5–14.6)
Sodium: 143 meq/L (ref 135–145)
Total Bilirubin: 0.3 mg/dL (ref 0.3–1.2)
Total Protein: 5.7 g/dL — ABNORMAL LOW (ref 6.0–8.3)
WBC: 8.5 10*3/uL (ref 4.5–10.5)

## 2009-07-26 ENCOUNTER — Ambulatory Visit (HOSPITAL_COMMUNITY): Admission: RE | Admit: 2009-07-26 | Discharge: 2009-07-26 | Payer: Self-pay | Admitting: Gastroenterology

## 2009-10-16 ENCOUNTER — Ambulatory Visit: Payer: Self-pay | Admitting: Oncology

## 2009-11-16 ENCOUNTER — Ambulatory Visit: Payer: Self-pay | Admitting: Oncology

## 2009-11-21 LAB — CBC WITH DIFFERENTIAL/PLATELET
Basophils Absolute: 0.1 10*3/uL (ref 0.0–0.1)
Eosinophils Absolute: 0.4 10*3/uL (ref 0.0–0.5)
HCT: 41 % (ref 34.8–46.6)
HGB: 13.3 g/dL (ref 11.6–15.9)
MONO#: 0.5 10*3/uL (ref 0.1–0.9)
NEUT%: 58.3 % (ref 38.4–76.8)
WBC: 7.6 10*3/uL (ref 3.9–10.3)
lymph#: 2.2 10*3/uL (ref 0.9–3.3)

## 2009-11-22 LAB — COMPREHENSIVE METABOLIC PANEL
AST: 14 U/L (ref 0–37)
Albumin: 3.8 g/dL (ref 3.5–5.2)
BUN: 16 mg/dL (ref 6–23)
Calcium: 8.9 mg/dL (ref 8.4–10.5)
Chloride: 107 mEq/L (ref 96–112)
Glucose, Bld: 97 mg/dL (ref 70–99)
Potassium: 4.3 mEq/L (ref 3.5–5.3)

## 2009-11-22 LAB — CANCER ANTIGEN 27.29: CA 27.29: 11 U/mL (ref 0–39)

## 2009-11-22 LAB — VITAMIN D 25 HYDROXY (VIT D DEFICIENCY, FRACTURES): Vit D, 25-Hydroxy: 45 ng/mL (ref 30–89)

## 2009-11-28 ENCOUNTER — Ambulatory Visit: Payer: Self-pay | Admitting: Oncology

## 2009-11-28 ENCOUNTER — Encounter: Payer: Self-pay | Admitting: Internal Medicine

## 2009-12-05 ENCOUNTER — Encounter: Admission: RE | Admit: 2009-12-05 | Discharge: 2009-12-05 | Payer: Self-pay | Admitting: Oncology

## 2009-12-15 ENCOUNTER — Emergency Department (HOSPITAL_COMMUNITY): Admission: EM | Admit: 2009-12-15 | Discharge: 2009-12-15 | Payer: Self-pay | Admitting: Emergency Medicine

## 2010-04-14 ENCOUNTER — Emergency Department (HOSPITAL_COMMUNITY): Admission: EM | Admit: 2010-04-14 | Discharge: 2010-04-14 | Payer: Self-pay | Admitting: Family Medicine

## 2010-04-17 ENCOUNTER — Other Ambulatory Visit: Admission: RE | Admit: 2010-04-17 | Discharge: 2010-04-17 | Payer: Self-pay | Admitting: Obstetrics and Gynecology

## 2010-05-09 ENCOUNTER — Encounter: Admission: RE | Admit: 2010-05-09 | Discharge: 2010-05-09 | Payer: Self-pay | Admitting: Cardiovascular Disease

## 2010-05-17 ENCOUNTER — Ambulatory Visit: Payer: Self-pay | Admitting: Oncology

## 2010-05-22 LAB — CBC WITH DIFFERENTIAL/PLATELET
Eosinophils Absolute: 0.2 10*3/uL (ref 0.0–0.5)
MCV: 91.6 fL (ref 79.5–101.0)
MONO#: 0.5 10*3/uL (ref 0.1–0.9)
MONO%: 6.2 % (ref 0.0–14.0)
NEUT#: 3.9 10*3/uL (ref 1.5–6.5)
RBC: 4.41 10*6/uL (ref 3.70–5.45)
RDW: 14.2 % (ref 11.2–14.5)
WBC: 7.3 10*3/uL (ref 3.9–10.3)
lymph#: 2.5 10*3/uL (ref 0.9–3.3)

## 2010-05-22 LAB — COMPREHENSIVE METABOLIC PANEL
AST: 21 U/L (ref 0–37)
Albumin: 4.1 g/dL (ref 3.5–5.2)
Alkaline Phosphatase: 63 U/L (ref 39–117)
Glucose, Bld: 101 mg/dL — ABNORMAL HIGH (ref 70–99)
Potassium: 4.4 mEq/L (ref 3.5–5.3)
Sodium: 140 mEq/L (ref 135–145)
Total Protein: 6.1 g/dL (ref 6.0–8.3)

## 2010-05-22 LAB — CANCER ANTIGEN 27.29: CA 27.29: 12 U/mL (ref 0–39)

## 2010-05-29 ENCOUNTER — Encounter: Payer: Self-pay | Admitting: Internal Medicine

## 2010-06-14 ENCOUNTER — Telehealth: Payer: Self-pay | Admitting: Internal Medicine

## 2010-07-03 ENCOUNTER — Ambulatory Visit: Payer: Self-pay | Admitting: Oncology

## 2010-09-08 ENCOUNTER — Encounter: Admission: RE | Admit: 2010-09-08 | Discharge: 2010-09-08 | Payer: Self-pay | Admitting: Orthopaedic Surgery

## 2010-12-09 ENCOUNTER — Encounter: Payer: Self-pay | Admitting: Obstetrics and Gynecology

## 2010-12-20 NOTE — Letter (Signed)
[facility]   Imported By: Sherian Rein 12/21/2009 07:23:14  _____________________________________________________________________  External Attachment:    Type:   Image     Comment:   External Document

## 2010-12-20 NOTE — Letter (Signed)
[facility]   Imported By: Lennie Odor 07/04/2010 14:27:26  _____________________________________________________________________  External Attachment:    Type:   Image     Comment:   External Document

## 2010-12-20 NOTE — Progress Notes (Signed)
Summary: med concern   Phone Note Call from Patient Call back at [phone]   Caller: Patient Call For: Dr. Marina Goodell Reason for Call: Talk to Nurse Summary of Call: concerned about reactions to "Phosamax" Initial call taken by: Vallarie Mare,  June 14, 2010 4:35 PM  Follow-up for Phone Call        Pt.was put on Fossamax by Dr.Rubin and when pt. read info it says it can cause esophageal stricture and with a hx of stricture she is concerned.She says Dr.Rubin's PA said stop it but Dr.Rubin told her to consult Dr.Rage Beever for advice on this matter. Follow-up by: Teryl Lucy RN,  June 14, 2010 4:43 PM  Additional Follow-up for Phone Call Additional follow up Details #1::        With know esophageal stricture, Fossamax not the best choice. The prescribing Doctor could consider one on the IV formulations (I'm not sure of specifics...out of my area of expetise) Additional Follow-up by: Hilarie Fredrickson MD,  June 15, 2010 8:54 AM    Additional Follow-up for Phone Call Additional follow up Details #2::     Pt. ntfd. of Dr.Maika Mcelveen's rec. re: Lacinda Axon so she will call Dr.Rubin back. Follow-up by: Teryl Lucy RN,  June 15, 2010 10:51 AM

## 2011-01-02 ENCOUNTER — Other Ambulatory Visit: Payer: Self-pay | Admitting: Oncology

## 2011-01-02 ENCOUNTER — Encounter: Payer: Medicare HMO | Admitting: Oncology

## 2011-01-02 LAB — BASIC METABOLIC PANEL
BUN: 11 mg/dL (ref 6–23)
Calcium: 8.8 mg/dL (ref 8.4–10.5)
Creatinine, Ser: 0.67 mg/dL (ref 0.40–1.20)

## 2011-01-03 ENCOUNTER — Encounter (HOSPITAL_BASED_OUTPATIENT_CLINIC_OR_DEPARTMENT_OTHER): Payer: Medicare HMO | Admitting: Oncology

## 2011-01-03 DIAGNOSIS — M899 Disorder of bone, unspecified: Secondary | ICD-10-CM

## 2011-01-11 ENCOUNTER — Other Ambulatory Visit: Payer: Self-pay | Admitting: Dermatology

## 2011-02-13 ENCOUNTER — Telehealth: Payer: Self-pay | Admitting: Internal Medicine

## 2011-02-13 ENCOUNTER — Other Ambulatory Visit: Payer: Self-pay | Admitting: Internal Medicine

## 2011-02-13 NOTE — Telephone Encounter (Signed)
Called patient.  Rx. Already had been sent on 02/05/11 to [facility]

## 2011-02-14 ENCOUNTER — Other Ambulatory Visit: Payer: Self-pay | Admitting: Internal Medicine

## 2011-02-15 MED ORDER — OMEPRAZOLE 40 MG PO CPDR: 40.0000 mg | DELAYED_RELEASE_CAPSULE | Freq: Every day | ORAL | Status: DC

## 2011-02-15 NOTE — Telephone Encounter (Signed)
Rx. resent to pharmacy in Epic.

## 2011-02-15 NOTE — Telephone Encounter (Signed)
Rx. Sent to pharmacy and pt. Notified.

## 2011-04-02 NOTE — Assessment & Plan Note (Signed)
[facility]                         GASTROENTEROLOGY OFFICE NOTE   Martha Mora                         MRN:          [id number]  DATE:10/28/2007                            DOB:          Martha Mora    HISTORY:  This is a pleasant, 65 year old, white female with a history  of coronary artery disease, hypertension, hyperlipidemia,  osteoarthritis, obesity, depression, adenomatous colon polyps,  gastroesophageal reflux disease complicated by peptic stricture  requiring esophageal dilation, and recently diagnosed breast cancer for  which she underwent lumpectomy with axillary node dissection and  subsequent radiation therapy. She was last evaluated in the office  July 07, 2002. At that time, she was on Prevacid for reflux symptoms  and doing well. Unfortunately, do to loss of insurance, she came off of  her proton pump inhibitor. She has been off of the medication for some  time. She has had problems with indigestion and heartburn. As well  pretty significant epigastric pain. She has also recurrent solid food  dysphagia on an intermittent basis. Her last surveillance colonoscopy  was performed September 05, 2005. Though the examination was negative  except for diverticulosis, her prep was fair with a compromised exam.  Followup in 3 years recommended. The patient denies weight loss, melena,  hematochezia, or lower abdominal pain. She is allergic to PENICILLIN.   CURRENT MEDICATIONS:  1. Diclofenac 75 mg b.i.d.  2. Propoxyphene p.r.n.  3. Citalopram 40 mg daily.  4. Pravastatin 40 mg daily.  5. Amlodipine 5 mg daily.  6. Multivitamin.  7. Advil p.r.n.  8. Calcium with D.  9. Aspirin.  10.Arimidex 1 mg daily.   FAMILY HISTORY:  Grandparent colon cancer.   PAST MEDICAL HISTORY:  As above. In addition, sleep apnea.   PAST SURGICAL HISTORY:  1. Tubal ligation.  2. Lumpectomy with axillary node dissection.  3. Appendectomy.   SOCIAL HISTORY:   The patient is married with 2 children. She lives with  her husband. She is retired. She smokes. She does not use alcohol.   REVIEW OF SYSTEMS:  Per diagnostic evaluation form.   PHYSICAL EXAMINATION:  Well-appearing female in no acute distress.  Blood pressure is 152/60, heart rate 62, weight 206.8 pounds. She is 5  feet 2.5 inches in height.  HEENT:  Sclera anicteric. Conjunctiva are pink. Oral mucosa is intact.  No adenopathy.  LUNGS:  Clear.  HEART:  Regular.  ABDOMEN:  Obese and soft without tenderness, mass or hernia. Good bowel  sounds heard.  EXTREMITIES:  Without edema.   IMPRESSION:  1. Gastroesophageal reflux disease. The patient is now experiencing      recurrent dysphagia likely due to recurrent esophageal stricture.  2. Problems with epigastric pain. Most likely due to untreated reflux.      However, she is on multiple nonsteroidal agents and aspirin. She      could be suffering from nonsteroidal antiinflammatory drug      intolerance or possibly an ulcer.  3. History of adenomatous colon polyps. Due for surveillance next      year.   RECOMMENDATIONS:  1.  Initiate proton pump inhibitor therapy in the form of omeprazole 20      mg daily.  2. Schedule upper endoscopy to evaluate pain and esophageal dilation      for relief of dysphagia. The nature of the procedure as well as the      risks, benefits, and alternatives were reviewed in detail. She      understood and agreed to proceed.  3. Keep Plans for surveillance colonoscopy next year.  4. Ongoing general medical care with Dr. Algie Coffer.     Martha Mora. Marina Goodell, MD  Electronically Signed    JNP/MedQ  DD: 10/28/2007  DT: 10/29/2007  Job #: [id number]   cc:   [name], M.D.  Pierce Crane, MD

## 2011-04-02 NOTE — Assessment & Plan Note (Signed)
[facility]                         GASTROENTEROLOGY OFFICE NOTE   Martha Mora, Martha Mora                         MRN:          [id number]  DATE:01/25/2008                            DOB:          June 28, 1946    HISTORY:  Martha Mora presents today for followup.  She is accompanied by her  husband.  She is a 65 year old with multiple medical problems as  previously outlined.  She was seen in the office October 28, 2007 for  reflux disease, dysphagia and epigastric pain.  She subsequently went to  the hospital with severe acute epigastric pain, and was found to have  markedly elevated liver function tests as well as cholelithiasis.  Her  symptoms abated.  She does have surgery appointment with Dr. Jamey Ripa next  week.  She also underwent upper endoscopy January 19, 2008.  She was found  to have a distal esophageal stricture secondary to reflux, as well as a  hiatal hernia.  Also, a small antral ulcer due to nonsteroidal anti-  inflammatory drugs, which she takes regularly for back pain.  She was  prescribed Prilosec.  Helicobacter pylori testing was negative.  Her  stricture was dilated with an 18 mm balloon.  She presents now for  followup.  We also checked repeat liver function tests, which were  entirely normal.  Since her procedure the patient reports resolution of  dysphagia.  No reflux symptoms.  No epigastric pain.  She continues to  take nonsteroidal anti-inflammatory drugs.  She is only taking Prilosec  20 mg daily versus b.i.d. as recommended.   She is allergic to PENICILLIN.   CURRENT MEDICATIONS:  Diclofenac,  Propoxyphene APAP, citalopram,  pravastatin, amlodipine, multivitamin, Advil, aspirin, Arimidex,  omeprazole, and Aleve.   PHYSICAL EXAMINATION:  The patient is alert and oriented and in no acute  distress.  Blood pressure is 132/80, heart rate is 64.  Weight is 205  pounds.  ABDOMEN:  Soft without tenderness, mass or hernia.  Good bowel sounds  heard.   IMPRESSION:  1. Gastroesophageal reflux disease complicated by peptic stricture.      Currently asymptomatic post dilation on Prilosec.  2. Nonsteroidal anti-inflammatory drug induced gastric ulcer.  3. Symptomatic cholelithiasis with transient choledocholithiasis.   RECOMMENDATIONS:  1. Increase Prilosec to 20 mg b.i.d.  2. Reflux precautions with attention to weight loss.  3. Warned retarding the use of multiple nonsteroidal anti-Inflammatory      agents and aspirin.  4. Keep surgical appointment as planned for laproscopic      cholecystectomy with IOC.  5. Surveillance colonoscopy the latter part of this year, as planned,      for a personal history of      colon polyps and family history of colon cancer.  6. Resume general medical care with Dr. Algie Coffer.     Wilhemina Bonito. Marina Goodell, MD  Electronically Signed    JNP/MedQ  DD: 01/25/2008  DT: 01/25/2008  Job #: [id number]   cc:   [name], M.D.  Pierce Crane, MD  Currie Paris, M.D.

## 2011-04-02 NOTE — Op Note (Signed)
NAME:  Martha Mora, NAZARIO NO.:  [id number]   MEDICAL RECORD NO.:  [id number]          PATIENT TYPE:  AMB   LOCATION:  DAY                          FACILITY:  [facility]   PHYSICIAN:  Currie Paris, M.D.DATE OF BIRTH:  1946/04/07   DATE OF PROCEDURE:  02/22/2008  DATE OF DISCHARGE:                               OPERATIVE REPORT   CCS#:  [id number].   PREOPERATIVE DIAGNOSES:  Gallstones.   POSTOPERATIVE DIAGNOSES:  Gallstones.   OPERATION:  Laparoscopic cholecystectomy with operative cholangiogram.   SURGEON:  Dr. Jamey Ripa.   ASSISTANT:  Dr. Lurene Shadow   ANESTHESIA:  General.   CLINICAL HISTORY:  This is a 65 year old lady whose been having  intermittent problems with GI symptoms.  She was known to have both  gallstones and an esophageal stricture.  The stricture had been managed  by Dr. Marina Goodell.  She continued to have some biliary type symptoms and we  elected to proceed to cholecystectomy.   DESCRIPTION OF PROCEDURE:  The patient was seen in the holding area and  she had no further questions.  We confirmed that removal of the  gallbladder was the planned procedure.   The patient was taken to the operating room and after satisfactory  general anesthesia had been obtained, the abdomen was prepped and  draped.  The time-out was performed.   0.25% plain Marcaine was used for each incision.  An umbilical incision  was made, the fascia entered and the peritoneal cavity entered under  direct vision.  A few omental adhesions to the umbilicus were broken up  and the [facility] introduced and the abdomen insufflated to 15.   No obvious abnormalities were seen other than a number of adhesions to  the gallbladder.   Three additional trocars were placed in the usual positions using a  10/11 in the epigastrium and two 5 mm laterally all under direct vision.   The gallbladder was retracted over the liver.  The omental adhesions  were taken down with cautery.  When I got down to  the neck of the  gallbladder, I was able to retract it out laterally and opened the  peritoneum and developed a nice window between the cystic duct and  cystic artery and gallbladder opening and exposing the triangle of  Calot.   I put a clip on the cystic artery near its origin and one on the cystic  duct near the gallbladder.  The cystic duct was opened and operative  angiography done using a Cook catheter placed percutaneously.  This  showed a fairly long cystic duct with possibly an intramural component,  filling of the hepatic radicals and duct and duodenum with no obvious  filling defects.   The catheter was removed and three clips placed on the stay side of  cystic duct that was divided.  Three additional clips were placed on the  cystic artery and it was divided leaving three clips on the stay side.  Another branch was clipped and divided and the gallbladder removed from  below to above.  There was some bile spillage. It was  a very thin,  somewhat intrahepatic gallbladder.  Once it was disconnected, I put it  in a bag.  We brought it out the umbilical site. We reinsufflated and  spent several minutes making sure the bed of the gallbladder was dry.  I  left a little Surgicel where there had been some oozing but everything  was dry at the end of the case.   Lateral ports removed under direct vision.  The umbilical site was  closed with the pursestring placed at the beginning of the case. The  abdomen was deflated through the epigastric port.  The skin was closed  with 4-0 Monocryl subcuticular plus Dermabond.   The patient tolerated the procedure well.  There were no operative  complications.  All counts were correct.      Currie Paris, M.D.  Electronically Signed     CJS/MEDQ  D:  02/22/2008  T:  02/22/2008  Job:  [id number]   cc:   [name], M.D.  Fax: [phone]   Artist Pais, M.D.  Fax: [phone]   Wilhemina Bonito. Marina Goodell, MD  [address]   [place]  [place] [address]

## 2011-04-02 NOTE — Op Note (Signed)
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   OPERATION:  Right partial mastectomy with axillary dissection.   SURGEON:  Currie Paris, M.D.   ASSISTANT:  Ollen Gross. Vernell Morgans, M.D.   ANESTHESIA:  General.   CLINICAL HISTORY:  Ms. Savell is a 61-year lady who apparently has had an  accessory nipple right at the inframammary fold.  This developed some  abnormality of the skin and the biopsy shows invasive carcinoma, breast  in origin, with lesion of about 1.5 cm by ultrasound and about 2.5 by  MRI.  She also had an axillary nodes that had been biopsied and was  positive.  After a lengthy discussion with the patient, she was  scheduled for a right partial mastectomy with axillary dissection.  Because of the location, she knew there would be some cosmetic issues  that might need further work because of difficulty excising this and  keeping a normal looking breast.   DESCRIPTION OF PROCEDURE:  The patient was seen in the holding area and  she had no further questions.  I identified and marked the right breast  as the operative side.   The patient was taken to the operating room. After satisfactory general  endotracheal anesthesia had been obtained, the right breast and axillary  area were prepped and draped as a sterile field.  The time out occurred.  As noted, the mass and skin abnormality were located in the inframammary  fold about the 5 o'clock position.  I looked at both a diamond shaped  somewhat radial incision going across the inframammary fold as well as  an elliptical one going transversely and taking out the inframammary  fold.  I thought the latter would pull the breast down too much and  produce more deformity.   I, therefore, made an elliptical incision going well around the mass.  I  divided the subcu tissues down to the chest wall and then completely  excised this doing an, in essence, partial mastectomy to include skin,  subcu, breast, and fascia.  In order to close this, I raised skin flaps  of the superior or breast portion of the incision as well as some skin  subcutaneous flaps below the breast and lower flaps.  I then extended  the incision along the inframammary fold from the corners of the diamond  shaped initial incision line going both medially and laterally.  This  then allowed me to close the skin from the superior corner of the  incision down towards the new inframammary fold.  I was able to, by  mobilizing more skin, then close  the new inframammary fold in the usual  direction and there was a little bit of closure of the inferior corner  of the incision.  I used a combination of staples as well as sutures.  There was going to be one corner where the four edges came together that  was likely to have some vascular ischemic changes, but I felt we had  good closure and everything appeared to be healthy.  Prior to closing, I  placed a 19 Blake drain and secured it with 3-0 nylon.   The axillary incision was made and the subcu tissues divided.  The  axillary contents were divided down to the chest wall with clips used on  some vessels.  The axilla was opened off of the clavipectoral fascia and  stripped from superior to inferior and medial to lateral.  There was a  very superficial nerve going out transversely which I avoided.  I do not  know if this was a very high intercostal brachial nerve or some aberrant  portion of the brachial plexus, but this  was cautiously avoided.  There  was what appeared to be at least one positive node just at the axillary  vein.  This was carefully dissected off.  Another large but soft node  was found at the superior end of the latissimus dorsi, so that was  somewhat lateral.  However, I  removed all the intervening tissue.  I  identified and preserved the long thoracic and thoracodorsal nerves and  we checked those by pinching at the end of case to make sure they  worked.  I primarily used the cautery and clips for hemostasis.  Once I  had the axillary contents removed and we trimmed out a little bit of  fatty tissue that was left behind to make sure we had good clearance, I  carefully palpated and made sure we felt no other adenopathy.  Everything else appeared to be fine.   The incision was irrigated and appeared dry.  I placed a 19 Blake drain  and then closed the incision with staples.  The patient tolerated the  procedure well.  There no operative complications.  All counts were  correct.      Currie Paris, M.D.  Electronically Signed     CJS/MEDQ  D:  05/21/2007  T:  05/22/2007  Job:  [id number]   cc:   [name], M.D.  Fax: [phone]   Artist Pais, M.D.  Fax: [phone]

## 2011-04-05 NOTE — Op Note (Signed)
NAME:  Martha Mora, Martha Mora NO.:  [id number]   MEDICAL RECORD NO.:  [id number]                   PATIENT TYPE:  INP   LOCATION:  5006                                 FACILITY:  [facility]   PHYSICIAN:  Lubertha Basque. Jerl Santos, M.D.             DATE OF BIRTH:  12-Jul-1946   DATE OF PROCEDURE:  08/23/2003  DATE OF DISCHARGE:                                 OPERATIVE REPORT   PREOPERATIVE DIAGNOSES:  1. Left shoulder impingement.  2. Left shoulder partial rotator cuff tear.   POSTOPERATIVE DIAGNOSIS:  1. Left shoulder impingement.  2. Left shoulder partial rotator cuff tear.   PROCEDURE:  1. Left shoulder arthroscopic acromioplasty.  2. Left shoulder arthroscopic debridement.   ANESTHESIA:  General.   ATTENDING SURGEON:  Lubertha Basque. Jerl Santos, M.D.   ASSISTANT:  Lindwood Qua, P.A.   INDICATIONS FOR PROCEDURE:  The patient is a 65 year old woman with a long  history of left shoulder pain. She has achieved transient relief with  injections many times in the past. She has an unfavorable shape to her  acromion with pain at rest and pain with activity. She was offered  arthroscopy. Informed operative consent was obtained after a discussion of  the possible  complications of reaction to the anesthesia and infection.   DESCRIPTION OF PROCEDURE:  The patient was taken to the operating suite  where general anesthetic was applied without difficulty. She was positioned  in the beachchair position and prepped and draped in the normal sterile  fashion. After injection of preoperative IV antibiotics and arthroscopy of  the left shoulder was undertaken through 2 portals.   The glenohumeral joint showed no degenerative change and the biceps and  rotator cuff appeared  benign from below. In the subacromial space she had a  great deal of bursitis, addressed with a thorough debridement. She did have  a partial thickeners rotator cuff tear which she also debrided  and was  directly under the tip of the acromion.   An acromioplasty was done with the bur in the lateral position followed  by  transfer of the bur to the posterior position. This transformed her acromial  morphology from a type 3 to a type 1 flat shape.   The shoulder was thoroughly irrigated at the end of the case followed  by  placement  of Marcaine with epinephrine and morphine. Simple sutures of  nylon were used to loosely reapproximate  the portals followed  by Adaptic  and a dry  gauze dressing  with decreased appetite.   Estimated blood loss was and interoperative fluids may be obtained from  anesthesia records. The patient was extubated in the operating room and  taken to the recovery room in stable condition. Plans were for her to be  admitted to the hospital for overnight observation due to poorly controlled  sleep apnea. We will discharge  her home in  the morning if all is well.                                               Lubertha Basque Jerl Santos, M.D.    PGD/MEDQ  D:  08/23/2003  T:  08/23/2003  Job:  [id number]

## 2011-04-05 NOTE — Op Note (Signed)
NAME:  Martha Mora, Martha Mora                            ACCOUNT NO.:  [id number]   MEDICAL RECORD NO.:  [id number]                   PATIENT TYPE:  INP   LOCATION:  3012                                 FACILITY:  [facility]   PHYSICIAN:  Hilda Lias, M.D.                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   POSTOPERATIVE DIAGNOSIS:  Degenerative disc disease with lumbar stenosis, L3-  L4, L4-L5, and L5-S1, foraminal stenosis L5-S1, spondylolisthesis.   PROCEDURE:  Bilateral L3, L4, L5 laminectomy, foraminotomy, posterolateral  fusion from L3 to S1 with allograft and autograft, Cell Saver.   SURGEON:  Hilda Lias, M.D.   ASSISTANT:  Danae Orleans. Venetia Maxon, M.D.   CLINICAL HISTORY:  Martha Mora is a lady who underwent anterior cervical  discectomy several months ago.  This lady, in the past, has had hip surgery.  Lately, she has been complaining of back pain radiating down both legs, left  worse than the right one.  X-rays show severe degenerative disc disease at  the level of 2-3, 3-4, and 4-5, with stenosis, hypertrophy of the facet, and  spondylolisthesis between L5 and S1 which did not move between flexion or  extension.  The patient has failed with conservative treatment.  Surgery was  advised.  The idea was to go ahead and because she has some weakness in  dorsiflexion in both feet, to proceed with laminectomy and interbody fusion  at the level of 4-5 but she knew that we might be involved in 3-4 and the  surgical procedure would change according to the findings during surgery.  The risks were explained during the history and physical.   PROCEDURE:  The patient was taken to the OR and a midline incision from L2  to L5 was made.  The muscles and fascia were  retracted laterally until we  were able to find the transverse process of L3, L4, and L5.  Indeed, the  patient has quite a bit of hypertrophy of the facet with scoliosis with  convexity to the left side.  As we planned, we went ahead and removed the  spinous process and lamina of L3, L4, and L5.  A thick, yellow ligament was  also removed.  Using the Kerrison punch as well as the drill, we drilled the  medial facet bilaterally.  The yellow ligament also was removed.  Using the  2, 3, and 4 Kerrison punch, we did a decompression of the L3, L4, L5, and S1  nerve roots.  We pulled the sacrum and, indeed, there was no room whatsoever  between L5-S1.  The area of more interest was 4-5 and, indeed, after we did  foraminotomy, there was  plenty of space.  There was some disc in 3-4, 4-5,  and 5-1, with a step off at the level of 5-1 which was stable.  Because of  that, after I consulted with Dr. Venetia Maxon, we decided not to do any type of  interbody position.  I think that we, during the surgery, were able to  accomplish decompression of the dural sac as well as the foramen.  Then, we  removed the periosteum of 3, 4, 5, and the ala of the sacrum.  Using a mix  of bone graft, autograft and allograft, we filled up the space with this  mixture.  X-rays showed, indeed,  the tip of the probe was at the level of L3 and the level of L5-S1.  We  probed L3 and it was wide open.  The area was irrigated.  BioGlue was left  in the area where the dura matter was really thin.  Then, the muscle was  closed with Vicryl and the skin with Steri-Strips.  The patient did well.                                               Hilda Lias, M.D.    EB/MEDQ  D:  01/24/2004  T:  01/24/2004  Job:  [id number]

## 2011-04-05 NOTE — Discharge Summary (Signed)
NAME:  Martha Mora, Martha Mora NO.:  [id number]   MEDICAL RECORD NO.:  [id number]                   PATIENT TYPE:  INP   LOCATION:  3041                                 FACILITY:  [facility]   PHYSICIAN:  Hilda Lias, M.D.                DATE OF BIRTH:  [birth year]   DATE OF ADMISSION:  02/25/2004  DATE OF DISCHARGE:  03/02/2004                                 DISCHARGE SUMMARY   ADMISSION DIAGNOSIS:  Rule out epidural infection after lumbar fusion.   FINAL DIAGNOSIS:  Lumbar infection.   CLINICAL HISTORY:  The patient was admitted to the hospital on February 25, 2004  by Dr. Delma Officer because of headache and fever.  The patient had a spinal  tap by Dr. Lovell Sheehan which showed increased white cells with decreased  glucose.  Because of that, the patient was admitted to 3000 to rule out the  possibility of infection in the lumbar area and/or meningitis.   LABORATORY DATA:  At the moment on discharge, all the cultures are negative.  The patient had increase of protein and low glucose in the CSF.  White cells  were within normal limits.   The patient was seen by infectious disease and she was started on  antibiotics.  Nevertheless, all the cultures were essentially negative and  at the end she was discharged on p.o. doxycycline twice a day for four  weeks.   CONDITION ON DISCHARGE:  Improving.   MEDICATIONS:  1. Doxycycline.  2. Percocet.  3. Flexeril.   DIET:  Regular.   ACTIVITY:  She is not to drive.  She is not to do any lifting.   FOLLOW UP:  She was to see me in my office in four weeks.                                                Hilda Lias, M.D.    EB/MEDQ  D:  03/30/2004  T:  03/31/2004  Job:  [id number]

## 2011-04-05 NOTE — Op Note (Signed)
NAME:  Martha Mora, Martha Mora NO.:  [id number]   MEDICAL RECORD NO.:  [id number]                   PATIENT TYPE:  INP   LOCATION:  3041                                 FACILITY:  [facility]   PHYSICIAN:  Hilda Lias, M.D.                DATE OF BIRTH:  1945-12-13   DATE OF PROCEDURE:  02/29/2004  DATE OF DISCHARGE:                                 OPERATIVE REPORT   PREOPERATIVE DIAGNOSIS:  Rule out infection in lumbar area.  Fluid  collection in the epidural space.   POSTOPERATIVE DIAGNOSIS:  Rule out infection in lumbar area.  Fluid  collection in the epidural space.   OPERATION PERFORMED:  Drainage with a needle, #18 gauge lumbar needle.  Specimen to the laboratory for culture.   SURGEON:  Hilda Lias, M.D.   ANESTHESIA:  Local.   INDICATIONS FOR PROCEDURE:  [name] was admitted because of fever.  The  patient has a test on the fluid which nothing grew out.  I have been talking  to infectious disease, Rockey Situ. Roxan Hockey, M.D.,  who has been helping in  the care of [name].  At the present time the patient has no fever, no  stiffness of the neck and so far the first culture showed no evidence of any  infection.  MRI done yesterday showed some fluid in the epidural space.  We  were talking about drainage with a needle in the OR.  Dr. Roxan Hockey agreed  with removal of fluid, sent to laboratory and if there is evidence of  infection, to go ahead and do open drainage.   DESCRIPTION OF PROCEDURE:  The patient was taken to PACU and the wound was  cleaned with Betadine.  Infiltration of the skin with Xylocaine was made.  Then an 18 gauge needle was inserted.  About approximately 25 mL of  yellowish type of fluid was aspirated.  Specimen was sent to laboratory for  protein, cell count, Gram culture aerobic and anaerobic.  After the  procedure, a Band-Aid was placed on the area.  The patient will go back  again to her room.                     Hilda Lias, M.D.    EB/MEDQ  D:  02/29/2004  T:  03/01/2004  Job:  [id number]

## 2011-04-05 NOTE — Cardiovascular Report (Signed)
NAME:  Martha Mora, Martha Mora NO.:  [id number]   MEDICAL RECORD NO.:  [id number]          PATIENT TYPE:  OIB   LOCATION:  2899                         FACILITY:  [facility]   PHYSICIAN:  [name], M.D.  DATE OF BIRTH:  June 28, 1946   DATE OF PROCEDURE:  04/18/2005  DATE OF DISCHARGE:                              CARDIAC CATHETERIZATION   PROCEDURE:  Left heart catheterization, selective coronary angiography, left  ventricular function study.   INDICATIONS:  This 65 year old white female with coronary artery disease and  a history of smoking, hypertension, and hyperlipidemia had chest pain with  abnormal EKG.   APPROACH:  Right femoral artery using 5 French sheath and catheters.  For  right coronary artery visualization, left Amplatz catheter was required due  to anterior takeoff.   COMPLICATIONS:  None.   HEMODYNAMIC DATA:  The left ventricular pressure was 157/15, and aortic  pressure 150/68.   CORONARY ANATOMY:  The left main coronary artery was unremarkable.   Left anterior descending coronary artery.  The left anterior descending  coronary artery had a proximal 40%, followed by 30-40% lesion in the  postseptal perforator region.  The midvessel stent was patent, and there was  mild diffuse narrowing of the distal vessel.   Diagonal 1 vessel had ostial 20-30% narrowing, followed by a small  aneurysmal dilatation and mild diffuse distal disease.  Diagonal 2 was  unremarkable.  Diagonal 3, 4, and 5 were very small vessels.   Left circumflex coronary artery.  The left circumflex coronary artery had  luminal irregularities.  The obtuse marginal branch was unremarkable.   Right coronary artery.  The right coronary artery had anterior takeoff, was  dominant, and had proximal 20-30% lesions x 3.  Midvessel had 30-40% lesion,  and then luminal irregularities.  It had a large posterolateral branch with  a mild lesion in the proximal half of the vessel.  Also had a  large marginal  branch, and a small posterior descending coronary artery was apparently  unremarkable.   Left ventriculogram.  The left ventriculogram showed mild anterior and  lateral wall hypokinesia, with ejection fraction of 55%.   IMPRESSION:  1.  Mild to moderate three-vessel coronary artery disease.  2.  Patent left anterior descending coronary artery stent site.  3.  Mild left ventricular systolic dysfunction.   RECOMMENDATIONS:  This patient will be treated medically, with emphasis on  smoking cessation, including counseling, and restarting Lipitor or Pravachol  and antihypertensive medications.       ASK/MEDQ  D:  04/18/2005  T:  04/18/2005  Job:  [id number]

## 2011-04-05 NOTE — Discharge Summary (Signed)
NAME:  Martha Mora, Martha Mora NO.:  [id number]   MEDICAL RECORD NO.:  [id number]                   PATIENT TYPE:  INP   LOCATION:  3012                                 FACILITY:  [facility]   PHYSICIAN:  Hilda Lias, M.D.                DATE OF BIRTH:  1946/03/27   DATE OF ADMISSION:  01/24/2004  DATE OF DISCHARGE:  01/31/2004                                 DISCHARGE SUMMARY   ADMISSION DIAGNOSIS:  Degenerative disc disease L3-4, L4-L5, L5-S1 with L5-  S1 spondylolisthesis.   DISCHARGE DIAGNOSIS:  Degenerative disc disease L3-4, L4-L5, L5-S1 with L5-  S1 spondylolisthesis.   CLINICAL HISTORY:  The patient was admitted because of back pain with  radiation down to both legs.  Several months ago, I did an anterior cervical  discectomy.  The patient has been complaining of pain to both legs.  The  patient has a severe case of degenerative disc disease at multiple levels  and she wants to proceed with surgery.   LABORATORY DATA:  Within normal limits.   HOSPITAL COURSE:  The patient was taken to surgery and a bilateral 3, 4, 5  laminectomy and foraminotomy was done.  Although  we were trying to go ahead  and set pedicle screw at the level of 4-5, we realized that she was really  stable.  We decided not to.  Nevertheless, using allograft and autograft we  did a posterolateral fusion from L3 to S1.  The patient did really well.  She was having some incisional pain.  About four days she started draining  some arterial fluid associated with headache.  Clinically, the patient had a  CSF leak.  She was positioned supine with the head down.  Nevertheless, she  was able to get out of the bed on her own despite our advise not to.   After surgery as I mentioned above, she did well ____________ sort of flat  in bed. She today is going great.  The wound is absolutely dry.  She has no  headache.  She has no fever and she wants to go home.  She is being  discharged today to  be followed by me next Thursday, February 09, 2004.   MEDICATIONS:  1. Keflex.  2. Neurontin.  3. Demerol.  4. Diazepam.   DIET:  Regular.   ACTIVITY:  The patient was encouraged not to smoke and she was encouraged  not to do any heavy lifting.   FOLLOW UP:  She will be seen by me on February 09, 2004.  If she develops any  fever, headache, or fluid from the wound, she is to call me at my office.  Hilda Lias, M.D.   EB/MEDQ  D:  01/31/2004  T:  02/02/2004  Job:  [id number]

## 2011-04-05 NOTE — H&P (Signed)
NAME:  KLEE, KOLEK NO.:  [id number]   MEDICAL RECORD NO.:  [id number]                   PATIENT TYPE:  INP   LOCATION:  3012                                 FACILITY:  [facility]   PHYSICIAN:  Hilda Lias, M.D.                DATE OF BIRTH:  Jan 29, 1946   DATE OF ADMISSION:  01/24/2004  DATE OF DISCHARGE:                                HISTORY & PHYSICAL   HISTORY:  The patient is a lady who in the past underwent a decompressive  laminectomy in the neck and later on a lumbar procedure.  I have been seeing  this lady for many years in my office, and she has been complaining of back  pain with radiation down to both legs, the left one worse than the right  one.  The patient had conservative treatment.  She had been seen by an  orthopedic surgeon for the right shoulder, and also for the carpal tunnel  syndrome.  Never-the-less, she is not any better.  She has failed  conservative treatment, including epidural injection.  The pain has been  down to both legs, although the left one is worse than the right one.  The  patient had x-rays, including a myelogram, and because of the findings, she  wants to proceed with surgery.   PAST MEDICAL/SURGICAL HISTORY:  1. A C-section.  2. Foot surgery.  3. Left leg surgery.  4. Prior cervical diskectomy and lumbar decompression.   FAMILY HISTORY:  Unremarkable.   SOCIAL HISTORY:  The patient does not drink.  She smokes.   PHYSICAL EXAMINATION:  HEENT:  Normal.  NECK:  There is a scar on the neck from previous surgery.  She is able to  flex with some difficulty with lateralization.  CARDIOVASCULAR:  Normal.  LUNGS:  There are some mild rhonchi bilaterally.  HEART:  Sounds normal.  ABDOMEN:  Normal.  NEUROLOGIC:  Mental status normal.  Cranial nerves normal.  Strength is 5/5  with some weakness of dorsiflexion of the feet.  The straight leg raising is  positive bilaterally at about 45 degrees.  The lumbar spine  x-rays show severe kyphoscoliosis with convexity of the L3-  4 and L4-5 with degenerative changes that go all the way down from L2-3, L4-  5, with a step-off of 5.0 mm between L5 and S1, which does not move with  flexion and extension.   CLINICAL IMPRESSION:  1. Degenerative disk disease with multiple levels compromised, being the     worst at the level of L4-5.  2. She has some degenerative spondylolisthesis between L5-S1, which does not     move with flexion or extension.   RECOMMENDATIONS:  The patient is being admitted for surgery.  The procedure  will be a bilateral L2, L3, L-4 and L5 laminectomy and will proceed with a  total diskectomy at the level of L4-5, and  with an interbody fusion and  pedicle screws.  A decision involving L3-4 and/or L5-S1 will be made during  surgery.  The patient  and her husband were in my office, and the surgical  situation was fully explained to them.  They know the risks of the surgery,  such as infection, failure of the bone graft because of the history of  smoking, failure of the screws, damage  to the nerves and spinal cord, damage to the vessels of the abdomen, injury  to the nerve root, and no improvement whatsoever of the pain, further need  for surgery, and especially worsening of the pain.  The patient declined  another opinion.                                                Hilda Lias, M.D.    EB/MEDQ  D:  01/24/2004  T:  01/24/2004  Job:  [id number]

## 2011-04-05 NOTE — H&P (Signed)
NAME:  Martha Mora, Martha Mora NO.:  [id number]   MEDICAL RECORD NO.:  [id number]                   PATIENT TYPE:  INP   LOCATION:  3041                                 FACILITY:  [facility]   PHYSICIAN:  Cristi Loron, M.D.            DATE OF BIRTH:  05-10-1946   DATE OF ADMISSION:  02/25/2004  DATE OF DISCHARGE:                                HISTORY & PHYSICAL   CHIEF COMPLAINT:  Headache, fever, chills, nausea, vomiting, and has not  been able to eat or drink very much, and feels dehydrated.   HISTORY OF PRESENT ILLNESS:  The patient is a 65 year old white female, a  patient of Dr. Hilda Lias. Dr. Jeral Fruit performed a multilevel lumbar  laminectomy for treatment of stenosis and scoliosis on the patient  approximately one month ago, i.e., on January 24, 2004. The patient's  postoperative course was complicated by a lot of discharge from her wound  onto the bed. The patient said at one point she had soaked her bed. The  patient was treated with further closure of her wound with staples. She was  started on empiric antibiotics and according to patient she spent about  eight days in the hospital and was discharged home on p.o. Keflex. She took  the Keflex, did not have any problems initially, and then about four days  ago developed greatly increasing back pain with headaches which worsened  tremendously with sitting or standing. She has not had any discharge from  her wound. She has felt very hot on multiple occasions, but did not have a  thermometer. She never did take her temperature. She has had chills and  rigors. She also admits to some neck discomfort. She has had no mental  status changes, etc. I spoke to the patient and her husband, and asked him  to bring her to the emergency department .   Presently, the patient complains of chills and rigors, she says I cannot  get warm. She complains of a postural headache and she does admit to some  meningismus  type discomfort.   PAST MEDICAL HISTORY:  Positive for hypertension. She had a myocardial  infarction in 1997 and Dr. Algie Coffer is her cardiologist. She underwent  angioplasty and stenting. She  has hypercholesterolemia. Remote history of  orthopedic injuries.   PAST SURGICAL HISTORY:  Repair of broken femur, ankle, and foot, and  Cesarean section.   MEDICATIONS:  Prior to admission are Voltaren, Pravachol, Ambien, Plavix,  Celexa, and aspirin. She does not recall the dosages.   DRUG ALLERGIES:  PENICILLIN caused a mild rash on her abdomen years ago. She  has not had any recently. She was able to take the Keflex without any  problems.   FAMILY MEDICAL HISTORY:  Noncontributory.   SOCIAL HISTORY:  The patient is married. She has two children. She is  employed. She lives in [place]. She denies ethanol  or drug use. She  smokes one and a half pack a day of cigarettes.  I advised her to quit.   REVIEW OF SYSTEMS:  Negative except as above. She denies chest pain,  shortness of breath, etc.   PHYSICAL EXAMINATION:  GENERAL: A pleasant 65 year old white female who  complains of headache and chills.  HEENT: Normocephalic and atraumatic. Pupils equal, round, and reactive to  light. Extraocular muscles intact. Oropharynx benign.  NECK: Supple. There are no masses, deformities, tracheal deviation, or  jugular venous distention. 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  THORAX:  Symmetric.  LUNGS: Clear to auscultation.  HEART: Regular rate and rhythm.  ABDOMEN: Soft and nontender.  EXTREMITIES: Obvious deformities.  BACK: The patient's lumbar wound is well healed except for a small area of  eschar. There is absolutely no discharge from her wound. I do not see any  evidence of superficial infection, purulence, etc.  NEUROLOGIC: The patient is alert and oriented times three. Cranial nerves II-  XII grossly intact. Her motor strength is grossly normal in all four  extremities. Sensory exam is  normal to light touch.   Admission labs reveal a white blood cell count is 11.5, INR normal.   I also reviewed the lumbar CT performed without contrast at a [facility] today.  It demonstrates the patient has a posterior epidural large  fluid collection about her laminectomy site. She has degenerative disk  disease and scoliosis. She has had laminectomies.   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 I have  discussed this decision with the patient and her husband. I have told I  think she has a continued spinal fluid leak and likely is affected at this  point giving her chills and fever. I have recommended that she undergo a  lumbar puncture to drain some of this fluid out of her back to send off for  cultures and will get blood cultures. After we have our cultures I recommend  we start her on empiric antibiotics given her __________.  I spoke to Dr.  Darlina Sicilian of infectious disease and we have decided to put her on empiric  vancomycin as well as cefepime.   Depending on the cultures, etc., I think she will likely need to have her  back explored and pseudomeningocele/CSF leak repaired.                                                Cristi Loron, M.D.    JDJ/MEDQ  D:  02/25/2004  T:  02/26/2004  Job:  [id number]

## 2011-04-05 NOTE — Op Note (Signed)
NAME:  Martha Mora, REMMERS NO.:  [id number]   MEDICAL RECORD NO.:  [id number]                   PATIENT TYPE:  EMS   LOCATION:  MAJO                                 FACILITY:  [facility]   PHYSICIAN:  Jimmye Norman III, M.D.               DATE OF BIRTH:  [birth year]   DATE OF PROCEDURE:  02/19/2003  DATE OF DISCHARGE:  02/19/2003                                 OPERATIVE REPORT   PREOPERATIVE DIAGNOSIS:  Acute appendicitis.   POSTOPERATIVE DIAGNOSIS:  Acute gangrenous appendicitis without perforation.   PROCEDURE:  Laparoscopic appendectomy.   SURGEON:  Jimmye Norman, M.D.   ANESTHESIA:  General endotracheal.   ESTIMATED BLOOD LOSS:  Less than 20 mL.   COMPLICATIONS:  None.   CONDITION:  Stable.   FINDINGS:  Acute gangrenous appendix which was oriented more toward the  midline.  No evidence of perforation.   INDICATION FOR PROCEDURE:  The patient is a 65 year old female with  abdominal pain localized to the right paramedian area and almost to the left  lower quadrant, who on CT was found to have acute appendicitis.   DESCRIPTION OF PROCEDURE:  The patient was taken to the operating room and  placed on the table in supine position.  After an adequate endotracheal  anesthetic was administered, she was prepped and draped in the usual sterile  manner, exposing the midline and the right lower quadrant and left lower  quadrant.   A supraumbilical curvilinear incision was made using a #11 blade.  This was  taken down to the midline fascia, which was subsequently pierced with the  Veress needle while tenting up on the anterior abdominal wall.  We confirmed  position of the Veress needle using the saline test.  Once this was done,  carbon dioxide insufflation was instilled into the peritoneal cavity up to a  maximal intra-abdominal pressure of 15 mmHg.  Once this was done, an 11/12  mm cannula and trocar were passed through the supraumbilical fascia into the  peritoneal cavity and confirmed to be in adequate position using the  laparoscope with attached camera and light source.  Subsequently a right  costal margin 5 mm cannula and a suprapubic 11/12 mm cannula were passed  into the peritoneal cavity under direct vision.  With all cannulas in place  and proper adapters, the patient was placed in Trendelenburg with the left  side tilted down.   We could see the appendix coursing from the taenia of the cecum toward the  midline, sort of covered by adhesions and mesentery of the small bowel.  These were bluntly taken down, and we separated and isolated the appendix  and the mesoappendix from the other bowel.  We separated the base of the  mesoappendix from the base of the appendix using a [place] and  then subsequently passed an Endo-GIA with 3.5 mm closure staples across the  base of the cecum.  This transected the appendix from the base of the cecum.  We used a 2.5 mm closure Endo-GIA to come across the vascular pedicle of the  mesoappendix.  This completely separated the appendix, which was  subsequently removed from the suprapubic trocar site using an EndoCatch bag  without contamination.   We subsequently inspected the right lower quadrant for bleeding, and there  was minimal bleeding.  We irrigated with about a liter of saline solution  and subsequently removed all cannulas.   The supraumbilical fascia was closed using a figure-of-eight stitch of 0  Vicryl pass on a UR6 needle.  We infiltrated all skin sites with 0.25%  Marcaine with epinephrine.  We closed the skin using a running subcuticular  suture of 4-0 Vicryl and sterile Steri-Strips were applied to the wound.  A  sterile dressing was applied along with antibiotic ointment.                                               Kathrin Ruddy, M.D.    JW/MEDQ  D:  02/19/2003  T:  02/21/2003  Job:  [id number]

## 2011-04-05 NOTE — H&P (Signed)
NAME:  Martha Mora, Martha Mora NO.:  [id number]   MEDICAL RECORD NO.:  [id number]                   PATIENT TYPE:  INP   LOCATION:  2899                                 FACILITY:  [facility]   PHYSICIAN:  Hilda Lias, M.D.                DATE OF BIRTH:  [birth year]   DATE OF ADMISSION:  03/22/2003  DATE OF DISCHARGE:                                HISTORY & PHYSICAL   HISTORY OF PRESENT ILLNESS:  The patient is a lady who was seen by me  initially about 2 years ago complaining of neck pain radiating to the upper  extremities. She came to the ER with pain going to the shoulders in both  arms, worse on the right side than on the left one. In the past she has had  some injection of the shoulder because of bursitis. She says that the pain  in the neck and the stiffness was getting worse as well as the arm pain. An  MRI was obtained. The patient has considered the treatment,  and she feels  that she is not getting any better and she wants to proceed with surgery.   PAST MEDICAL HISTORY:  1. Cesarean section.  2. Foot surgery.  3. Left leg surgery.   ALLERGIES:  PENICILLIN.   SOCIAL HISTORY:  She smokes. She does not drink.   FAMILY HISTORY:  History of COPD in her family.   PHYSICAL EXAMINATION:  HEENT:  Normal.  NECK: She is able to flex but extension laterally produces some tenderness.  She has quite a bit of tenderness in both trapezius muscles.  LUNGS:  There is some mild rhonchi bilaterally.  HEART:  Heart sounds normal, normal S1, S2 with a normal pulse.  ABDOMEN:  Normal.  NEUROLOGIC:  Cranial nerves normal.  Strength, I found that she had weakness  in both biceps of her extensor with borderline weakness in the deltoids.  Reflexes are symmetrical with the weakness in the biceps. Sensation is  normal, although she complains of some tingling sensation  in  both hands.  She has some mild weakness on dorsiflexion of both feet.   LABORATORY DATA:  Spinal  x-ray shows a severe case of degenerative disk  disease at the level of 4-5, 5-6. The MRI showed that she has foraminal  stenosis with degenerative disk disease mostly involving 4-5 and 5-6.   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    RECOMMENDATIONS:  The patient wanted to proceed with surgery. The procedure  will be a two level cervical diskectomy using allograft plate. She knows  about the risks such as infection, CSF leak, worsening pain, paralysis,  weakness of the arms, especially with the deltoid and failure of the graft  and the plate, damage to the placing of the neck, damage to the esophagus  and trachea  and possibility of stroke. She understood and gave informed  consent.                                               Hilda Lias, M.D.    EB/MEDQ  D:  03/22/2003  T:  03/22/2003  Job:  [id number]

## 2011-04-05 NOTE — Op Note (Signed)
NAME:  Martha Mora, WESSELLS NO.:  [id number]   MEDICAL RECORD NO.:  [id number]                   PATIENT TYPE:  INP   LOCATION:  3172                                 FACILITY:  [facility]   PHYSICIAN:  Hilda Lias, M.D.                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   PROCEDURES:  Anterior C4-5, C5-6 diskectomy, decompression of the spinal  cord and the nerve roots, interbody fusion with allograft, plate from C4 to  C6.  Microscope.   SURGEON:  Hilda Lias, M.D.   CLINICAL HISTORY:  The patient is a lady who was seen in my office on  several occasions since 2002 because of neck pain with radiation to both  upper extremities.  She is getting worse.  MRI showed that she has  spondylosis between 4-5, 5-6.  The patient wanted to proceed with surgery.  The surgical risks were explained and she declined a second opinion.   DESCRIPTION OF PROCEDURE:  The patient was taken to the OR and after  intubation, traction was applied.  The patient had _________ neck.  After  the traction, the neck was prepped with Betadine.  A transverse incision was  made through the skin and subcutaneous tissue, platysma, down to the  cervical spine.  The patient had quite a bit of anterior osteophyte.  They  were removed and a lateral C-spine showed that indeed we were at the level  of 4-5.  From then on we opened the calcified ligament at the level of 4-5  and 5-6.  With the microscope and the drill we started drilling the disk,  which was mostly calcified.  This was also helped with the help of the  curette.  Then the posterior ligament was opened, which was also calcified,  and using the 2 and 3 mm  Kerrison punch, decompressing the spinal cord as  well as bilateral foraminotomy was accomplished.  This procedure was done at  both levels with plenty of room for the C5 and C6 nerve roots.  Then the end  plates were drilled and two pieces of allograft of 7 mm iliac crest were  inserted.  This was followed by a plate from C4 to C6.  It was difficult to  see the lower plate, but indeed the upper plate showed that the screws were  in good position.  Nevertheless, under visualization the lower plate was  right at the level of C6.  Then the area was irrigated.  Investigation of  the area was made.  Hemostasis was done with bipolar.  From then on the area  was dressed with Vicryl and steri-stripped.                                               Hilda Lias, M.D.    EB/MEDQ  D:  03/22/2003  T:  03/22/2003  Job:  [id number]   cc:   [name], M.D.  [address]  [place] [address]
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
## 2011-04-05 NOTE — Op Note (Signed)
NAME:  Martha Mora, Martha Mora NO.:  [id number]   MEDICAL RECORD NO.:  [id number]                   PATIENT TYPE:  INP   LOCATION:  3041                                 FACILITY:  [facility]   PHYSICIAN:  Cristi Loron, M.D.            DATE OF BIRTH:  [birth year]   DATE OF PROCEDURE:  02/25/2004  DATE OF DISCHARGE:                                 OPERATIVE REPORT   BRIEF HISTORY:  The patient is a 65 year old white female who is about a  month status post a multilevel laminectomy performed by Dr. Jeral Fruit.  This  was complicated by a CSF leak.  She initially did well, but had the recent  onset of headaches, fever, etc.  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  I  therefore recommended that she have an aspiration of this epidural  fluid/lumbar puncture to rule out meningitis.  I described the procedure and  the risks which include worsening of her pre-existing headache, a small  chance of nerve injury and the possibility to introduce infection via the  small tap although I think she is infected already.  The patient and her  husband have weighed the risks, benefits and alternatives to the procedure  and decided to proceed with a lumbar puncture.   SURGEON:  Cristi Loron, M.D.   DESCRIPTION OF PROCEDURE:  The patient was placed in the sitting position. I  prepared the patient's lumbar region with Betadine solution and sterile  drapes were applied.  I then anesthetized the L4-5 region with 1% lidocaine  solution.  I then introduced a 22 gauge spinal needle into the epidural  space and there was cloudy appearing spinal fluid that came out freely.  I  collected this and sent it off to the laboratory for the appropriate  studies. I removed the needle and placed a Band-Aid on the puncture site.  The patient tolerated the procedure well.                                               Cristi Loron, M.D.    JDJ/MEDQ  D:  02/25/2004  T:  02/26/2004  Job:  [id number]

## 2011-04-05 NOTE — Op Note (Signed)
NAME:  Martha Mora, Martha Mora NO.:  [id number]   MEDICAL RECORD NO.:  [id number]                   PATIENT TYPE:  AMB   LOCATION:  DSC                                  FACILITY:  [facility]   PHYSICIAN:  Lubertha Basque. Jerl Santos, M.D.             DATE OF BIRTH:  03/21/1946   DATE OF PROCEDURE:  06/12/2004  DATE OF DISCHARGE:                                 OPERATIVE REPORT   PREOPERATIVE DIAGNOSES:  Left carpal tunnel syndrome.   POSTOPERATIVE DIAGNOSES:  Left carpal tunnel syndrome.   OPERATION PERFORMED:  Left carpal tunnel release.   SURGEON:  Lubertha Basque. Jerl Santos, M.D.   ASSISTANT:  Prince Rome, P.A.   ANESTHESIA:  Bier block MAC.   INDICATIONS FOR PROCEDURE:  The patient is a 65 year old woman with a long  history of bilateral hand disability.  She has been diagnosed with carpal  tunnel syndrome on both sides.  This has persisted despite bracing and oral  anti-inflammatories and rest.  With continued symptoms, she was offered a  release on the opposite side which went very well and now she is for the  same procedure on the left.  Informed operative consent was obtained after  discussion of possible complications of reaction to anesthesia, infection,  neurovascular injury.   DESCRIPTION OF PROCEDURE:  The patient was taken to the operating suite  where Bier block and MAC were applied without difficulty.  The patient was  positioned supine and prepped and draped in the normal sterile fashion.  After administration of preop intravenous antibiotics, a small palmar  incision was made ulnar to the thenar flexion crease.  Dissection was  carried down through the palmar fascia to expose the transverse carpal  ligament.  This was released under direct visualization. The dissection was  taken distally towards the transverse arch of vessels and proximally through  the distal fascia of the forearm.  She did have some moderate compression  observed directly.  The  wound was irrigated followed by reapproximation of  the skin with vertical mattresses of nylon.  Adaptic was placed on the wound  followed by dry gauze and a loose Ace wrap.  A volar splint of plaster was  applied with the wrist in slight extension. The tourniquet was deflated and  her fingers became pink and warm immediately.  Intraoperative fluids and  estimated blood loss as well as tourniquet time can be obtain from  anesthesia records.   DISPOSITION:  The patient was taken to the recovery room in stable  condition.  Plans were for the patient to go home the same day and to follow  up in the office in less than a week.  I will contact her by phone tonight.  Lubertha Basque Jerl Santos, M.D.    PGD/MEDQ  D:  06/12/2004  T:  06/12/2004  Job:  [id number]

## 2011-04-22 ENCOUNTER — Other Ambulatory Visit: Payer: Self-pay | Admitting: Obstetrics and Gynecology

## 2011-04-22 DIAGNOSIS — Z853 Personal history of malignant neoplasm of breast: Secondary | ICD-10-CM

## 2011-04-22 DIAGNOSIS — N644 Mastodynia: Secondary | ICD-10-CM

## 2011-04-29 ENCOUNTER — Other Ambulatory Visit: Payer: Self-pay | Admitting: Obstetrics and Gynecology

## 2011-04-29 ENCOUNTER — Ambulatory Visit
Admission: RE | Admit: 2011-04-29 | Discharge: 2011-04-29 | Disposition: A | Discharge status: Home / Self Care | Payer: Medicare HMO | Source: Ambulatory Visit | Attending: Obstetrics and Gynecology | Admitting: Obstetrics and Gynecology

## 2011-04-29 DIAGNOSIS — N644 Mastodynia: Secondary | ICD-10-CM

## 2011-04-29 DIAGNOSIS — Z853 Personal history of malignant neoplasm of breast: Secondary | ICD-10-CM

## 2011-05-10 ENCOUNTER — Other Ambulatory Visit: Payer: Self-pay | Admitting: Oncology

## 2011-05-10 ENCOUNTER — Encounter (HOSPITAL_BASED_OUTPATIENT_CLINIC_OR_DEPARTMENT_OTHER): Payer: Medicare HMO | Admitting: Oncology

## 2011-05-10 DIAGNOSIS — Z170 Estrogen receptor positive status [ER+]: Secondary | ICD-10-CM

## 2011-05-10 DIAGNOSIS — C50519 Malignant neoplasm of lower-outer quadrant of unspecified female breast: Secondary | ICD-10-CM

## 2011-05-10 LAB — CBC WITH DIFFERENTIAL/PLATELET
Basophils Absolute: 0 10*3/uL (ref 0.0–0.1)
Eosinophils Absolute: 0.4 10*3/uL (ref 0.0–0.5)
HGB: 13.9 g/dL (ref 11.6–15.9)
NEUT#: 6 10*3/uL (ref 1.5–6.5)
RDW: 13.8 % (ref 11.2–14.5)
lymph#: 2.7 10*3/uL (ref 0.9–3.3)

## 2011-05-11 LAB — COMPREHENSIVE METABOLIC PANEL
ALT: 12 U/L (ref 0–35)
AST: 18 U/L (ref 0–37)
Alkaline Phosphatase: 53 U/L (ref 39–117)
BUN: 15 mg/dL (ref 6–23)
Chloride: 108 mEq/L (ref 96–112)
Creatinine, Ser: 0.84 mg/dL (ref 0.50–1.10)
Total Bilirubin: 0.2 mg/dL — ABNORMAL LOW (ref 0.3–1.2)

## 2011-05-11 LAB — VITAMIN D 25 HYDROXY (VIT D DEFICIENCY, FRACTURES): Vit D, 25-Hydroxy: 62 ng/mL (ref 30–89)

## 2011-06-15 ENCOUNTER — Other Ambulatory Visit: Payer: Self-pay | Admitting: Internal Medicine

## 2011-06-17 NOTE — Telephone Encounter (Signed)
Refilled for #30.  Pt. Has not been seen since 07/2009 and saw Mike Gip, PA at that time.  Advised she will need to see Dr. Marina Goodell before anymore refills are given.

## 2011-06-20 ENCOUNTER — Other Ambulatory Visit: Payer: Self-pay | Admitting: Internal Medicine

## 2011-06-26 ENCOUNTER — Encounter (HOSPITAL_BASED_OUTPATIENT_CLINIC_OR_DEPARTMENT_OTHER): Payer: Medicare HMO | Admitting: Oncology

## 2011-06-26 ENCOUNTER — Other Ambulatory Visit: Payer: Self-pay | Admitting: Oncology

## 2011-06-26 DIAGNOSIS — Z170 Estrogen receptor positive status [ER+]: Secondary | ICD-10-CM

## 2011-06-26 DIAGNOSIS — M949 Disorder of cartilage, unspecified: Secondary | ICD-10-CM

## 2011-06-26 DIAGNOSIS — M899 Disorder of bone, unspecified: Secondary | ICD-10-CM

## 2011-06-26 DIAGNOSIS — C50519 Malignant neoplasm of lower-outer quadrant of unspecified female breast: Secondary | ICD-10-CM

## 2011-06-26 LAB — BASIC METABOLIC PANEL
BUN: 11 mg/dL (ref 6–23)
Calcium: 9.4 mg/dL (ref 8.4–10.5)
Glucose, Bld: 98 mg/dL (ref 70–99)

## 2011-07-04 ENCOUNTER — Other Ambulatory Visit: Payer: Self-pay | Admitting: Oncology

## 2011-07-04 ENCOUNTER — Encounter (HOSPITAL_BASED_OUTPATIENT_CLINIC_OR_DEPARTMENT_OTHER): Payer: Medicare HMO | Admitting: Oncology

## 2011-07-04 DIAGNOSIS — M899 Disorder of bone, unspecified: Secondary | ICD-10-CM

## 2011-07-04 DIAGNOSIS — C50519 Malignant neoplasm of lower-outer quadrant of unspecified female breast: Secondary | ICD-10-CM

## 2011-07-04 LAB — BASIC METABOLIC PANEL
Calcium: 9 mg/dL (ref 8.4–10.5)
Sodium: 139 mEq/L (ref 135–145)

## 2011-07-15 ENCOUNTER — Other Ambulatory Visit: Payer: Self-pay | Admitting: Internal Medicine

## 2011-08-09 LAB — URINALYSIS, ROUTINE W REFLEX MICROSCOPIC
Hgb urine dipstick: NEGATIVE
Ketones, ur: NEGATIVE
Specific Gravity, Urine: 1.019
pH: 7

## 2011-08-09 LAB — URINE MICROSCOPIC-ADD ON

## 2011-08-09 LAB — POCT I-STAT CREATININE: Operator id: 265201

## 2011-08-09 LAB — I-STAT 8, (EC8 V) (CONVERTED LAB)
Bicarbonate: 23.6
Glucose, Bld: 99
TCO2: 25
pCO2, Ven: 34.7 — ABNORMAL LOW
pH, Ven: 7.442 — ABNORMAL HIGH

## 2011-08-09 LAB — HEPATIC FUNCTION PANEL
ALT: 262 — ABNORMAL HIGH
Alkaline Phosphatase: 162 — ABNORMAL HIGH
Bilirubin, Direct: 0.4 — ABNORMAL HIGH
Indirect Bilirubin: 0.4
Total Bilirubin: 0.8
Total Protein: 5.8 — ABNORMAL LOW

## 2011-08-12 ENCOUNTER — Other Ambulatory Visit: Payer: Self-pay | Admitting: Internal Medicine

## 2011-08-13 LAB — CBC
Hemoglobin: 13.8
MCHC: 34.3
RDW: 14.3

## 2011-08-13 LAB — DIFFERENTIAL
Eosinophils Absolute: 0.3
Lymphs Abs: 2.1
Monocytes Relative: 7
Neutro Abs: 7.8 — ABNORMAL HIGH
Neutrophils Relative %: 70

## 2011-08-13 LAB — URINALYSIS, ROUTINE W REFLEX MICROSCOPIC
Glucose, UA: NEGATIVE
Hgb urine dipstick: NEGATIVE
Specific Gravity, Urine: 1.019
pH: 7

## 2011-08-13 LAB — COMPREHENSIVE METABOLIC PANEL
ALT: 12
Calcium: 9.2
Glucose, Bld: 81
Sodium: 140
Total Protein: 6

## 2011-08-13 NOTE — Telephone Encounter (Signed)
Refilled for #30 with one refill.  Patient must schedule appointment

## 2011-08-14 LAB — PROTIME-INR
INR: 0.9
Prothrombin Time: 12.5

## 2011-08-14 LAB — HEMOGLOBIN AND HEMATOCRIT, BLOOD
HCT: 39.2
Hemoglobin: 13.2

## 2011-08-14 LAB — APTT: aPTT: 32

## 2011-09-03 LAB — COMPREHENSIVE METABOLIC PANEL
Alkaline Phosphatase: 96
BUN: 9
Chloride: 106
Creatinine, Ser: 0.69
Glucose, Bld: 89
Potassium: 4.1
Total Bilirubin: 0.5
Total Protein: 5.8 — ABNORMAL LOW

## 2011-09-03 LAB — URINE MICROSCOPIC-ADD ON

## 2011-09-03 LAB — URINALYSIS, ROUTINE W REFLEX MICROSCOPIC
Ketones, ur: NEGATIVE
Nitrite: NEGATIVE
Protein, ur: NEGATIVE
pH: 6

## 2011-09-03 LAB — CBC
HCT: 42.2
Hemoglobin: 14.4
MCV: 88.9
RDW: 14.5 — ABNORMAL HIGH

## 2011-09-03 LAB — DIFFERENTIAL
Basophils Absolute: 0.1
Basophils Relative: 1
Neutro Abs: 6.5
Neutrophils Relative %: 60

## 2011-10-14 ENCOUNTER — Other Ambulatory Visit: Payer: Self-pay | Admitting: Internal Medicine

## 2011-10-18 ENCOUNTER — Other Ambulatory Visit: Payer: Self-pay | Admitting: Internal Medicine

## 2011-10-22 ENCOUNTER — Telehealth: Payer: Self-pay | Admitting: Internal Medicine

## 2011-10-22 MED ORDER — OMEPRAZOLE 40 MG PO CPDR: 40.0000 mg | DELAYED_RELEASE_CAPSULE | Freq: Every day | ORAL | Status: DC

## 2011-10-24 ENCOUNTER — Telehealth: Payer: Self-pay

## 2011-10-24 NOTE — Telephone Encounter (Signed)
Left message with patient letting her know I had filled her rx

## 2011-10-26 ENCOUNTER — Telehealth: Payer: Self-pay | Admitting: Oncology

## 2011-10-26 NOTE — Telephone Encounter (Signed)
per pof 08/08 called pts home no answer, will mail her feb-aug2013 appts to her hom eon 10/26/2011

## 2011-11-04 ENCOUNTER — Ambulatory Visit (INDEPENDENT_AMBULATORY_CARE_PROVIDER_SITE_OTHER): Payer: Medicare HMO | Admitting: Internal Medicine

## 2011-11-04 ENCOUNTER — Encounter: Payer: Self-pay | Admitting: Internal Medicine

## 2011-11-04 VITALS — BP 114/62 | HR 60 | Ht 62.5 in | Wt 209.0 lb

## 2011-11-04 DIAGNOSIS — K222 Esophageal obstruction: Secondary | ICD-10-CM

## 2011-11-04 DIAGNOSIS — Z86010 Personal history of colonic polyps: Secondary | ICD-10-CM

## 2011-11-04 DIAGNOSIS — K219 Gastro-esophageal reflux disease without esophagitis: Secondary | ICD-10-CM

## 2011-11-04 MED ORDER — OMEPRAZOLE 40 MG PO CPDR: 40.0000 mg | DELAYED_RELEASE_CAPSULE | Freq: Every day | ORAL | Status: DC

## 2011-11-04 NOTE — Patient Instructions (Addendum)
You have been given a separate informational sheet regarding your tobacco use, the importance of quitting and local resources to help you quit.  We have sent the following medications to your pharmacy for you to pick up at your convenience:  Omeprazole  Please follow up in 1 -2  Years, sooner if needed

## 2011-11-04 NOTE — Progress Notes (Signed)
HISTORY OF PRESENT ILLNESS:  Martha Mora is a 65 y.o. female with multiple significant medical problems as listed below. She is followed in this office for GERD complicated by peptic stricture which has required esophageal dilation, as well as a history of adenomatous colon polyps. She presents today for followup regarding management of her GERD. As well, she requests medication refill. Her last upper endoscopy was performed in March of 2009. At that time, a ringlike esophageal stricture was dilated with an 18 mm balloon. She was also noted to have a small antral ulcer. Colonoscopy in September 2009 was remarkable only for significant left-sided diverticulosis with sigmoid stenosis. For her GERD, she is maintained on omeprazole 40 mg daily. On medication no problems with indigestion or heartburn. She only has dysphagia to large pills. Significant reflux symptoms when she misses a dose or 2 of medication. No lower GI complaints except for occasional loose stools (which is not new).  REVIEW OF SYSTEMS:  All non-GI ROS negative except for cough, depression, urinary leakage, insomnia  Past Medical History  Diagnosis Date  . Reflux esophagitis   . Duodenitis without hemorrhage   . Internal hemorrhoids   . Diverticulosis   . Sleep apnea   . Breast cancer   . GERD (gastroesophageal reflux disease)   . Adenomatous colon polyp   . Depression   . Obesity   . Hyperlipemia   . Hypertension   . CAD (coronary artery disease)   . Osteoarthritis   . Family history of malignant neoplasm of gastrointestinal tract     Past Surgical History  Procedure Date  . Appendectomy   . Cholecystectomy   . Breast lumpectomy   . Tubal ligation   . Cervical fusion   . Lumbar laminectomy   . Carpal tunnel release     bilateral  . Cesarean section   . Shoulder surgery   . Foot surgery     Social History Martha Mora  reports that she has been smoking Cigarettes.  She has been smoking about 1.5 packs per  day. She does not have any smokeless tobacco history on file. She reports that she does not drink alcohol or use illicit drugs.  family history includes Colon cancer in her paternal grandmother and Heart disease in her maternal grandfather and maternal grandmother.  Allergies  Allergen Reactions  . Penicillins        PHYSICAL EXAMINATION: Vital signs: BP 114/62  Pulse 60  Ht 5' 2.5" (1.588 m)  Wt 209 lb (94.802 kg)  BMI 37.62 kg/m2 General: Obese, Well-developed, well-nourished, no acute distress HEENT: Sclerae are anicteric, conjunctiva pink. Oral mucosa intact Lungs: Clear Heart: Regular Abdomen: soft, obese, nontender, nondistended, no obvious ascites, no peritoneal signs, normal bowel sounds. No organomegaly. Her surgical incisions well-healed Extremities: No edema Psychiatric: alert and oriented x3. Cooperative    ASSESSMENT:  #1. GERD complicated by peptic stricture. Asymptomatic post dilation on PPI #2. History of adenomatous colon polyps. Last colonoscopy September 2009 without neoplasia #3. Multiple medical problems   PLAN:  #1. Reflux precautions with attention to weight loss #2. Refill omeprazole 40 mg daily. #3. Surveillance colonoscopy around September 2014 #4. Routine office followup in 1-2 years. Sooner if clinically necessary #5. Resume general medical care with primary provider

## 2011-12-27 ENCOUNTER — Other Ambulatory Visit: Payer: Self-pay | Admitting: Physician Assistant

## 2011-12-27 ENCOUNTER — Ambulatory Visit (HOSPITAL_BASED_OUTPATIENT_CLINIC_OR_DEPARTMENT_OTHER): Payer: Medicare HMO

## 2011-12-27 ENCOUNTER — Other Ambulatory Visit: Payer: Self-pay

## 2011-12-27 ENCOUNTER — Encounter: Payer: Self-pay | Admitting: Physician Assistant

## 2011-12-27 ENCOUNTER — Other Ambulatory Visit (HOSPITAL_BASED_OUTPATIENT_CLINIC_OR_DEPARTMENT_OTHER): Payer: Medicare HMO | Admitting: Lab

## 2011-12-27 DIAGNOSIS — D0590 Unspecified type of carcinoma in situ of unspecified breast: Secondary | ICD-10-CM

## 2011-12-27 DIAGNOSIS — M899 Disorder of bone, unspecified: Secondary | ICD-10-CM

## 2011-12-27 DIAGNOSIS — M8580 Other specified disorders of bone density and structure, unspecified site: Secondary | ICD-10-CM

## 2011-12-27 DIAGNOSIS — C50919 Malignant neoplasm of unspecified site of unspecified female breast: Secondary | ICD-10-CM

## 2011-12-27 DIAGNOSIS — M949 Disorder of cartilage, unspecified: Secondary | ICD-10-CM

## 2011-12-27 DIAGNOSIS — Z170 Estrogen receptor positive status [ER+]: Secondary | ICD-10-CM

## 2011-12-27 HISTORY — DX: Other specified disorders of bone density and structure, unspecified site: M85.80

## 2011-12-27 LAB — COMPREHENSIVE METABOLIC PANEL
AST: 21 U/L (ref 0–37)
Albumin: 3.6 g/dL (ref 3.5–5.2)
Alkaline Phosphatase: 68 U/L (ref 39–117)
BUN: 13 mg/dL (ref 6–23)
Potassium: 4.2 mEq/L (ref 3.5–5.3)
Sodium: 139 mEq/L (ref 135–145)
Total Bilirubin: 0.3 mg/dL (ref 0.3–1.2)
Total Protein: 5.7 g/dL — ABNORMAL LOW (ref 6.0–8.3)

## 2011-12-27 LAB — CBC WITH DIFFERENTIAL/PLATELET
EOS%: 3.7 % (ref 0.0–7.0)
MCH: 30.5 pg (ref 25.1–34.0)
MCV: 91.1 fL (ref 79.5–101.0)
MONO%: 4.8 % (ref 0.0–14.0)
RBC: 4.51 10*6/uL (ref 3.70–5.45)
RDW: 14.5 % (ref 11.2–14.5)

## 2011-12-27 MED ORDER — SODIUM CHLORIDE 0.9 % IV SOLN
Freq: Once | INTRAVENOUS | Status: AC
  Administered 2011-12-27: 11:00:00 via INTRAVENOUS

## 2011-12-27 MED ORDER — ZOLEDRONIC ACID 4 MG/100ML IV SOLN
4.0000 mg | Freq: Once | INTRAVENOUS | Status: AC
  Administered 2011-12-27: 4 mg via INTRAVENOUS
  Filled 2011-12-27: qty 100

## 2011-12-27 NOTE — Patient Instructions (Signed)
[facility] Discharge Instructions for Patients Receiving Chemotherapy  Today you received the following chemotherapy agents Zometa   BELOW ARE SYMPTOMS THAT SHOULD BE REPORTED IMMEDIATELY:  *FEVER GREATER THAN 100.5 F  *CHILLS WITH OR WITHOUT FEVER  NAUSEA AND VOMITING THAT IS NOT CONTROLLED WITH YOUR NAUSEA MEDICATION  *UNUSUAL SHORTNESS OF BREATH  *UNUSUAL BRUISING OR BLEEDING  TENDERNESS IN MOUTH AND THROAT WITH OR WITHOUT PRESENCE OF ULCERS  *URINARY PROBLEMS  *BOWEL PROBLEMS  UNUSUAL RASH Items with * indicate a potential emergency and should be followed up as soon as possible.   Feel free to call the clinic you have any questions or concerns. The clinic phone number is [phone].   I have been informed and understand all the instructions given to me. I know to contact the clinic, my physician, or go to the Emergency Department if any problems should occur. I do not have any questions at this time, but understand that I may call the clinic during office hours   should I have any questions or need assistance in obtaining follow up care.    __________________________________________  _____________  __________ Signature of Patient or Authorized Representative            Date                   Time    __________________________________________ Nurse's Signature

## 2012-01-09 NOTE — Telephone Encounter (Signed)
completed

## 2012-01-17 ENCOUNTER — Encounter: Payer: Self-pay | Admitting: Internal Medicine

## 2012-02-12 ENCOUNTER — Ambulatory Visit: Payer: Medicare HMO | Admitting: Internal Medicine

## 2012-02-18 NOTE — Telephone Encounter (Signed)
See previous note

## 2012-03-23 ENCOUNTER — Other Ambulatory Visit: Payer: Self-pay | Admitting: Obstetrics and Gynecology

## 2012-03-23 DIAGNOSIS — Z853 Personal history of malignant neoplasm of breast: Secondary | ICD-10-CM

## 2012-04-29 ENCOUNTER — Ambulatory Visit
Admission: RE | Admit: 2012-04-29 | Discharge: 2012-04-29 | Disposition: A | Discharge status: Home / Self Care | Payer: Medicare HMO | Source: Ambulatory Visit | Attending: Obstetrics and Gynecology | Admitting: Obstetrics and Gynecology

## 2012-04-29 DIAGNOSIS — Z853 Personal history of malignant neoplasm of breast: Secondary | ICD-10-CM

## 2012-05-18 HISTORY — PX: FACIAL COSMETIC SURGERY: SHX629

## 2012-05-27 ENCOUNTER — Other Ambulatory Visit (HOSPITAL_COMMUNITY)
Admission: RE | Admit: 2012-05-27 | Discharge: 2012-05-27 | Disposition: A | Discharge status: Home / Self Care | Payer: Medicare HMO | Source: Ambulatory Visit | Attending: Obstetrics and Gynecology | Admitting: Obstetrics and Gynecology

## 2012-05-27 ENCOUNTER — Other Ambulatory Visit: Payer: Self-pay | Admitting: Obstetrics and Gynecology

## 2012-05-27 DIAGNOSIS — Z01419 Encounter for gynecological examination (general) (routine) without abnormal findings: Secondary | ICD-10-CM | POA: Insufficient documentation

## 2012-06-25 ENCOUNTER — Telehealth: Payer: Self-pay | Admitting: Oncology

## 2012-06-25 ENCOUNTER — Other Ambulatory Visit: Payer: Medicare HMO | Admitting: Lab

## 2012-06-25 ENCOUNTER — Ambulatory Visit (HOSPITAL_BASED_OUTPATIENT_CLINIC_OR_DEPARTMENT_OTHER): Payer: Medicare HMO | Admitting: Lab

## 2012-06-25 ENCOUNTER — Ambulatory Visit (HOSPITAL_BASED_OUTPATIENT_CLINIC_OR_DEPARTMENT_OTHER): Payer: Medicare HMO | Admitting: Oncology

## 2012-06-25 VITALS — BP 118/71 | HR 63 | Temp 98.6°F | Resp 20 | Ht 62.5 in | Wt 170.5 lb

## 2012-06-25 DIAGNOSIS — Z170 Estrogen receptor positive status [ER+]: Secondary | ICD-10-CM

## 2012-06-25 DIAGNOSIS — M899 Disorder of bone, unspecified: Secondary | ICD-10-CM

## 2012-06-25 DIAGNOSIS — C50519 Malignant neoplasm of lower-outer quadrant of unspecified female breast: Secondary | ICD-10-CM

## 2012-06-25 DIAGNOSIS — D0590 Unspecified type of carcinoma in situ of unspecified breast: Secondary | ICD-10-CM

## 2012-06-25 DIAGNOSIS — C773 Secondary and unspecified malignant neoplasm of axilla and upper limb lymph nodes: Secondary | ICD-10-CM

## 2012-06-25 DIAGNOSIS — C50919 Malignant neoplasm of unspecified site of unspecified female breast: Secondary | ICD-10-CM

## 2012-06-25 LAB — CBC WITH DIFFERENTIAL/PLATELET
Basophils Absolute: 0.1 10*3/uL (ref 0.0–0.1)
Eosinophils Absolute: 0.3 10*3/uL (ref 0.0–0.5)
HGB: 12.2 g/dL (ref 11.6–15.9)
MCV: 92.1 fL (ref 79.5–101.0)
MONO#: 0.6 10*3/uL (ref 0.1–0.9)
MONO%: 5 % (ref 0.0–14.0)
NEUT#: 8 10*3/uL — ABNORMAL HIGH (ref 1.5–6.5)
RBC: 3.97 10*6/uL (ref 3.70–5.45)
RDW: 15.2 % — ABNORMAL HIGH (ref 11.2–14.5)
WBC: 11.3 10*3/uL — ABNORMAL HIGH (ref 3.9–10.3)
lymph#: 2.2 10*3/uL (ref 0.9–3.3)

## 2012-06-25 NOTE — Progress Notes (Signed)
Hematology and Oncology Follow Up Visit  Martha Mora [id number] [birth year] 66 y.o. 06/25/2012 7:09 PM   DIAGNOSIS:   Encounter Diagnosis  Name Primary?  . 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 daily.  The patient initially treated with Arimidex, but due to intolerance of AI have switched to tamoxifen 20 mg p.o. daily.   Interim History:  Since being seen last the Martha Mora underwent plastic surgery about 3 weeks ago and had a facial lift and similar cosmetic work done on her face. She still has drains in. She otherwise feels pretty well. She is been fairly busy. She is continues taking tamoxifen. She has no active complaints. Her last mammogram was in June 2013. No active disease was noted to  Medications: I have reviewed the patient's current medications.  Allergies:  Allergies  Allergen Reactions  . Penicillins     Past Medical History, Surgical history, Social history, and Family History were reviewed and updated.  Review of Systems: Constitutional:  Negative for fever, chills, night sweats, anorexia, weight loss, pain. Cardiovascular: no chest pain or dyspnea on exertion Respiratory: negative Neurological: negative Dermatological: negative ENT: negative Skin Gastrointestinal: negative Genito-Urinary: negative Hematological and Lymphatic: negative Breast: negative Musculoskeletal: negative Remaining ROS negative.  Physical Exam:  Blood pressure 118/71, pulse 63, temperature 98.6 F (37 C), temperature source Oral, resp. rate 20, height 5' 2.5" (1.588 m), weight 170 lb 8 oz (77.338 kg).  ECOG: 0  HEENT:  Sclerae anicteric, conjunctivae  pink.  Oropharynx clear. She has fair scars on her face. She has drains coming out of the back of her head bilaterally. No mucositis or candidiasis.  Nodes:  No cervical, supraclavicular, or axillary lymphadenopathy palpated.  Breast Exam:  Right breast is benign.  No masses, discharge, skin change, or nipple inversion.  Left breast is benign.  No masses, discharge, skin change, or nipple inversion..  Lungs:  Clear to auscultation bilaterally.  No crackles, rhonchi, or wheezes.  Heart:  Regular rate and rhythm.  Abdomen:  Soft, nontender.  Positive bowel sounds.  No organomegaly or masses palpated.  Musculoskeletal:  No focal spinal tenderness to palpation.  Extremities:  Benign.  No peripheral edema or cyanosis.  Skin:  Benign.  Neuro:  Nonfocal.     Lab Results: Lab Results  Component Value Date   WBC 11.3* 06/25/2012   HGB 12.2 06/25/2012   HCT 36.5 06/25/2012   MCV 92.1 06/25/2012   PLT 339 06/25/2012     Chemistry      Component Value Date/Time   NA 140 06/25/2012 1328   K 4.5 06/25/2012 1328   CL 106 06/25/2012 1328   CO2 25 06/25/2012 1328   BUN 16 06/25/2012 1328   CREATININE 0.69 06/25/2012 1328      Component Value Date/Time   CALCIUM 9.0 06/25/2012 1328   ALKPHOS 58 06/25/2012 1328   AST 19 06/25/2012 1328   ALT 11 06/25/2012 1328   BILITOT 0.3 06/25/2012 1328       Radiological Studies:  No results found.   IMPRESSIONS AND PLAN: A 66 y.o. female with   T2 N1 breast cancer on followup on tamoxifen she does appear to be tolerating. I will see  her in 6 months time with appropriate imaging studies. She appears doing well.  Spent more than half the time coordinating care, as well as discussion of BMI and its implications.      Martha Mora 8/8/20137:09 PM Cell [id number]

## 2012-06-25 NOTE — Telephone Encounter (Signed)
gve the pt her aug 2014 appt calendar along with the June mammo appt

## 2012-06-26 LAB — COMPREHENSIVE METABOLIC PANEL
Albumin: 3.6 g/dL (ref 3.5–5.2)
Alkaline Phosphatase: 58 U/L (ref 39–117)
BUN: 16 mg/dL (ref 6–23)
Calcium: 9 mg/dL (ref 8.4–10.5)
Chloride: 106 mEq/L (ref 96–112)
Glucose, Bld: 89 mg/dL (ref 70–99)
Potassium: 4.5 mEq/L (ref 3.5–5.3)
Sodium: 140 mEq/L (ref 135–145)
Total Protein: 5.8 g/dL — ABNORMAL LOW (ref 6.0–8.3)

## 2012-06-26 LAB — VITAMIN D 25 HYDROXY (VIT D DEFICIENCY, FRACTURES): Vit D, 25-Hydroxy: 48 ng/mL (ref 30–89)

## 2012-07-30 ENCOUNTER — Other Ambulatory Visit: Payer: Self-pay | Admitting: Oncology

## 2012-07-30 DIAGNOSIS — D0590 Unspecified type of carcinoma in situ of unspecified breast: Secondary | ICD-10-CM

## 2012-09-29 ENCOUNTER — Encounter (HOSPITAL_COMMUNITY): Payer: Self-pay

## 2012-09-30 ENCOUNTER — Ambulatory Visit (HOSPITAL_COMMUNITY)
Admission: RE | Admit: 2012-09-30 | Discharge: 2012-10-01 | Disposition: A | Discharge status: Home / Self Care | Payer: Medicare HMO | Source: Ambulatory Visit | Attending: Cardiovascular Disease | Admitting: Cardiovascular Disease

## 2012-09-30 ENCOUNTER — Encounter (HOSPITAL_COMMUNITY): Payer: Self-pay | Admitting: General Practice

## 2012-09-30 ENCOUNTER — Encounter (HOSPITAL_COMMUNITY)
Admission: RE | Disposition: A | Discharge status: Home / Self Care | Payer: Self-pay | Source: Ambulatory Visit | Attending: Cardiovascular Disease

## 2012-09-30 DIAGNOSIS — D0590 Unspecified type of carcinoma in situ of unspecified breast: Secondary | ICD-10-CM

## 2012-09-30 DIAGNOSIS — Z79899 Other long term (current) drug therapy: Secondary | ICD-10-CM | POA: Insufficient documentation

## 2012-09-30 DIAGNOSIS — I10 Essential (primary) hypertension: Secondary | ICD-10-CM | POA: Insufficient documentation

## 2012-09-30 DIAGNOSIS — I059 Rheumatic mitral valve disease, unspecified: Secondary | ICD-10-CM | POA: Insufficient documentation

## 2012-09-30 DIAGNOSIS — C50919 Malignant neoplasm of unspecified site of unspecified female breast: Secondary | ICD-10-CM | POA: Insufficient documentation

## 2012-09-30 DIAGNOSIS — Z955 Presence of coronary angioplasty implant and graft: Secondary | ICD-10-CM

## 2012-09-30 DIAGNOSIS — Z23 Encounter for immunization: Secondary | ICD-10-CM | POA: Insufficient documentation

## 2012-09-30 DIAGNOSIS — F17200 Nicotine dependence, unspecified, uncomplicated: Secondary | ICD-10-CM | POA: Insufficient documentation

## 2012-09-30 DIAGNOSIS — E785 Hyperlipidemia, unspecified: Secondary | ICD-10-CM

## 2012-09-30 DIAGNOSIS — I200 Unstable angina: Secondary | ICD-10-CM | POA: Insufficient documentation

## 2012-09-30 DIAGNOSIS — E669 Obesity, unspecified: Secondary | ICD-10-CM | POA: Insufficient documentation

## 2012-09-30 DIAGNOSIS — I2510 Atherosclerotic heart disease of native coronary artery without angina pectoris: Secondary | ICD-10-CM | POA: Insufficient documentation

## 2012-09-30 DIAGNOSIS — K219 Gastro-esophageal reflux disease without esophagitis: Secondary | ICD-10-CM | POA: Insufficient documentation

## 2012-09-30 DIAGNOSIS — Z7902 Long term (current) use of antithrombotics/antiplatelets: Secondary | ICD-10-CM | POA: Insufficient documentation

## 2012-09-30 HISTORY — DX: Angina pectoris, unspecified: I20.9

## 2012-09-30 HISTORY — DX: Cardiac murmur, unspecified: R01.1

## 2012-09-30 HISTORY — PX: PERCUTANEOUS CORONARY STENT INTERVENTION (PCI-S): SHX5485

## 2012-09-30 HISTORY — DX: Anxiety disorder, unspecified: F41.9

## 2012-09-30 HISTORY — DX: Chronic obstructive pulmonary disease, unspecified: J44.9

## 2012-09-30 HISTORY — PX: LEFT HEART CATHETERIZATION WITH CORONARY ANGIOGRAM: SHX5451

## 2012-09-30 HISTORY — DX: Obstructive sleep apnea (adult) (pediatric): G47.33

## 2012-09-30 HISTORY — DX: Acute myocardial infarction, unspecified: I21.9

## 2012-09-30 LAB — BASIC METABOLIC PANEL
CO2: 25 mEq/L (ref 19–32)
Calcium: 8.7 mg/dL (ref 8.4–10.5)
Creatinine, Ser: 0.61 mg/dL (ref 0.50–1.10)
GFR calc Af Amer: >90 mL/min (ref >90)
Sodium: 142 mEq/L (ref 135–145)

## 2012-09-30 LAB — CBC
Hemoglobin: 14 g/dL (ref 12.0–15.0)
MCH: 31.3 pg (ref 26.0–34.0)
MCHC: 34.1 g/dL (ref 30.0–36.0)
Platelets: 280 10*3/uL (ref 150–400)
RDW: 13.8 % (ref 11.5–15.5)

## 2012-09-30 LAB — PROTIME-INR
INR: 0.95 (ref 0.00–1.49)
Prothrombin Time: 12.6 seconds (ref 11.6–15.2)

## 2012-09-30 LAB — POCT ACTIVATED CLOTTING TIME: Activated Clotting Time: 309 seconds

## 2012-09-30 SURGERY — LEFT HEART CATHETERIZATION WITH CORONARY ANGIOGRAM
Anesthesia: LOCAL

## 2012-09-30 MED ORDER — ASPIRIN 81 MG PO CHEW
CHEWABLE_TABLET | ORAL | Status: AC
  Filled 2012-09-30: qty 4

## 2012-09-30 MED ORDER — MIDAZOLAM HCL 2 MG/2ML IJ SOLN
INTRAMUSCULAR | Status: AC
  Filled 2012-09-30: qty 2

## 2012-09-30 MED ORDER — DIAZEPAM 5 MG PO TABS
ORAL_TABLET | ORAL | Status: AC
  Filled 2012-09-30: qty 1

## 2012-09-30 MED ORDER — FENTANYL CITRATE 0.05 MG/ML IJ SOLN
INTRAMUSCULAR | Status: AC
  Filled 2012-09-30: qty 2

## 2012-09-30 MED ORDER — SODIUM CHLORIDE 0.9 % IJ SOLN: 3.0000 mL | Freq: Two times a day (BID) | INTRAMUSCULAR | Status: DC

## 2012-09-30 MED ORDER — FERROUS SULFATE 325 (65 FE) MG PO TABS
325.0000 mg | ORAL_TABLET | Freq: Every day | ORAL | Status: DC
Start: 2012-10-01 — End: 2012-10-01
  Administered 2012-10-01: 09:00:00 325 mg via ORAL
  Filled 2012-09-30 (×2): qty 1

## 2012-09-30 MED ORDER — BIVALIRUDIN 250 MG IV SOLR
INTRAVENOUS | Status: AC
  Filled 2012-09-30: qty 250

## 2012-09-30 MED ORDER — PNEUMOCOCCAL VAC POLYVALENT 25 MCG/0.5ML IJ INJ
0.5000 mL | INJECTION | INTRAMUSCULAR | Status: AC
  Administered 2012-10-01: 0.5 mL via INTRAMUSCULAR
  Filled 2012-09-30: qty 0.5

## 2012-09-30 MED ORDER — TICAGRELOR 90 MG PO TABS
ORAL_TABLET | ORAL | Status: AC
  Filled 2012-09-30: qty 2

## 2012-09-30 MED ORDER — ONDANSETRON HCL 4 MG/2ML IJ SOLN: 4.0000 mg | Freq: Four times a day (QID) | INTRAMUSCULAR | Status: DC | PRN

## 2012-09-30 MED ORDER — ASPIRIN 81 MG PO CHEW
81.0000 mg | CHEWABLE_TABLET | Freq: Every day | ORAL | Status: DC
  Administered 2012-10-01: 09:00:00 81 mg via ORAL
  Filled 2012-09-30: qty 1

## 2012-09-30 MED ORDER — TEMAZEPAM 15 MG PO CAPS
30.0000 mg | ORAL_CAPSULE | Freq: Every evening | ORAL | Status: DC | PRN
  Administered 2012-09-30: 21:00:00 30 mg via ORAL
  Filled 2012-09-30 (×2): qty 1

## 2012-09-30 MED ORDER — WHITE PETROLATUM GEL
Status: AC
  Filled 2012-09-30: qty 5

## 2012-09-30 MED ORDER — ASPIRIN 81 MG PO CHEW
324.0000 mg | CHEWABLE_TABLET | ORAL | Status: AC
  Administered 2012-09-30: 324 mg via ORAL

## 2012-09-30 MED ORDER — ALPRAZOLAM 0.25 MG PO TABS
0.2500 mg | ORAL_TABLET | Freq: Every evening | ORAL | Status: DC | PRN
  Administered 2012-09-30: 16:00:00 0.25 mg via ORAL
  Filled 2012-09-30: qty 1

## 2012-09-30 MED ORDER — TICAGRELOR 90 MG PO TABS
90.0000 mg | ORAL_TABLET | Freq: Two times a day (BID) | ORAL | Status: DC
  Administered 2012-09-30 – 2012-10-01 (×2): 90 mg via ORAL
  Filled 2012-09-30 (×3): qty 1

## 2012-09-30 MED ORDER — SODIUM CHLORIDE 0.9 % IV SOLN
0.2500 mg/kg/h | INTRAVENOUS | Status: AC
  Administered 2012-09-30: 0.25 mg/kg/h via INTRAVENOUS
  Filled 2012-09-30: qty 250

## 2012-09-30 MED ORDER — NITROGLYCERIN 0.2 MG/ML ON CALL CATH LAB
INTRAVENOUS | Status: AC
  Filled 2012-09-30: qty 1

## 2012-09-30 MED ORDER — ATORVASTATIN CALCIUM 80 MG PO TABS
80.0000 mg | ORAL_TABLET | ORAL | Status: DC
  Filled 2012-09-30: qty 1

## 2012-09-30 MED ORDER — ACETAMINOPHEN 325 MG PO TABS: 650.0000 mg | ORAL_TABLET | ORAL | Status: DC | PRN

## 2012-09-30 MED ORDER — NITROGLYCERIN IN D5W 200-5 MCG/ML-% IV SOLN
5.0000 ug/min | INTRAVENOUS | Status: DC
  Administered 2012-09-30: 5 ug/min via INTRAVENOUS

## 2012-09-30 MED ORDER — OXYCODONE-ACETAMINOPHEN 5-325 MG PO TABS
1.0000 | ORAL_TABLET | ORAL | Status: DC | PRN
  Administered 2012-09-30: 1 via ORAL
  Filled 2012-09-30: qty 1

## 2012-09-30 MED ORDER — NITROGLYCERIN IN D5W 200-5 MCG/ML-% IV SOLN
INTRAVENOUS | Status: AC
  Filled 2012-09-30: qty 250

## 2012-09-30 MED ORDER — PANTOPRAZOLE SODIUM 40 MG PO TBEC
40.0000 mg | DELAYED_RELEASE_TABLET | Freq: Every day | ORAL | Status: DC
  Administered 2012-09-30 – 2012-10-01 (×2): 40 mg via ORAL
  Filled 2012-09-30 (×2): qty 1

## 2012-09-30 MED ORDER — CITALOPRAM HYDROBROMIDE 20 MG PO TABS
20.0000 mg | ORAL_TABLET | Freq: Two times a day (BID) | ORAL | Status: DC
  Administered 2012-09-30 – 2012-10-01 (×2): 20 mg via ORAL
  Filled 2012-09-30 (×3): qty 1

## 2012-09-30 MED ORDER — SODIUM CHLORIDE 0.9 % IV SOLN
INTRAVENOUS | Status: DC
  Administered 2012-09-30: 07:00:00 via INTRAVENOUS

## 2012-09-30 MED ORDER — LIDOCAINE HCL (PF) 1 % IJ SOLN
INTRAMUSCULAR | Status: AC
  Filled 2012-09-30: qty 30

## 2012-09-30 MED ORDER — DIAZEPAM 5 MG PO TABS
5.0000 mg | ORAL_TABLET | ORAL | Status: AC
  Administered 2012-09-30: 5 mg via ORAL

## 2012-09-30 MED ORDER — ATORVASTATIN CALCIUM 80 MG PO TABS: 80.0000 mg | ORAL_TABLET | Freq: Every day | ORAL | Status: DC

## 2012-09-30 MED ORDER — SODIUM CHLORIDE 0.9 % IV SOLN
INTRAVENOUS | Status: AC
  Administered 2012-09-30: 15:00:00 via INTRAVENOUS

## 2012-09-30 MED ORDER — CLOPIDOGREL BISULFATE 75 MG PO TABS
ORAL_TABLET | ORAL | Status: AC
  Filled 2012-09-30: qty 1

## 2012-09-30 MED ORDER — SODIUM CHLORIDE 0.9 % IJ SOLN: 3.0000 mL | INTRAMUSCULAR | Status: DC | PRN

## 2012-09-30 MED ORDER — SODIUM CHLORIDE 0.9 % IV SOLN: 250.0000 mL | INTRAVENOUS | Status: DC | PRN

## 2012-09-30 MED ORDER — CLOPIDOGREL BISULFATE 75 MG PO TABS
75.0000 mg | ORAL_TABLET | ORAL | Status: AC
Start: 2012-10-01 — End: 2012-09-30
  Administered 2012-09-30: 75 mg via ORAL

## 2012-09-30 MED ORDER — SIMVASTATIN 40 MG PO TABS
40.0000 mg | ORAL_TABLET | Freq: Every day | ORAL | Status: DC
  Administered 2012-09-30: 18:00:00 40 mg via ORAL
  Filled 2012-09-30 (×3): qty 1

## 2012-09-30 MED ORDER — HEPARIN (PORCINE) IN NACL 2-0.9 UNIT/ML-% IJ SOLN
INTRAMUSCULAR | Status: AC
  Filled 2012-09-30: qty 1000

## 2012-09-30 NOTE — CV Procedure (Signed)
PTCA stents report dictated on 09/30/2012 dictation number is [id number]

## 2012-09-30 NOTE — Progress Notes (Signed)
Site area: right groin  Site Prior to Removal:  Level 0  Pressure Applied For 20 MINUTES    Minutes Beginning at 1600  Manual:   yes  Patient Status During Pull:  stable  Post Pull Groin Site:  Level 0  Post Pull Instructions Given:  yes  Post Pull Pulses Present:  yes  Dressing Applied:  yes  Comments:   

## 2012-09-30 NOTE — Progress Notes (Signed)
Utilization Review Completed.   Sheriece Jefcoat, RN, BSN Nurse Case Manager  [phone]  

## 2012-09-30 NOTE — CV Procedure (Signed)
PROCEDURE:  Left heart catheterization with selective coronary angiography, left ventriculogram.  CLINICAL HISTORY:  This is a 66 years old female with known CAD and LAD stent had atypical chest pain and abnormal stress test showing anterior ischemia..  The risks, benefits, and details of the procedure were explained to the patient.  The patient verbalized understanding and wanted to proceed.  Informed written consent was obtained.  PROCEDURE TECHNIQUE:  The patient was approached from the right femoral artery using a 5 French short sheath.  Left coronary angiography was done using a Judkins L4 guide catheter.  Right coronary angiography was done using a AL1 guide catheter.  Left ventriculography was done using a pigtail catheter.    CONTRAST:  Total of 90 cc.  COMPLICATIONS:  None.  At the end of the procedure a manual device was used for hemostasis.    HEMODYNAMICS:  Aortic pressure was 150/80; LV pressure was 150/15; LVEDP 18.  There was no gradient between the left ventricle and aorta.    ANGIOGRAM/CORONARY ARTERIOGRAM:   The left main coronary artery is long and without significant disease.  The left anterior descending artery is severely diseased proximally involving diagonal 1 origin with 80-90 % stenosis, followed by 50 % stenosis and 10-20 % in-stent disease. Moderate narrowing of distal half of the vessel. Diagonal vessel has ostial 80 % stenosis then mild ectasia, then diffuse narrowing.  The left circumflex artery and OM branch are without significant disease.  The right coronary artery is dominant and has proximal long 80-90 % stenosis, mild mid vessel disease and 50 % distal narrowing. Postero-lateral and Posterior descending arteries showed mild diffuse disease.  LEFT VENTRICULOGRAM:  Left ventricular angiogram was done in the 30 RAO projection and revealed normal left ventricular wall motion and systolic function with an estimated ejection fraction of 70%.  LVEDP was 18  mmHg.  IMPRESSION OF HEART CATHETERIZATION:   1. Normal left main coronary artery. 2. Severe left anterior descending artery and its branches disease. 3. Minimal left circumflex artery and its branches disease. 4. Severe right coronary artery disease. 5. Normal left ventricular systolic function.  LVEDP 18 mmHg.  Ejection fraction 70 %.  RECOMMENDATION:   Patient underwent LAD stent and diagonal balloon angioplasty by Dr. Sharyn Lull. She will have RCA stent in near future.

## 2012-09-30 NOTE — H&P (Signed)
Martha Mora is an 66 y.o. female.   Chief Complaint: Back pain/Chest pain HPI: 66 years old white female with back pain similar to her MI in 1997. Her TMST was positive for EKG changes of ischemia. She has risk factors of HTN, Tobacco use disorder, known CAD and S/P breast cancer surgery.  Past Medical History  Diagnosis Date  . Reflux esophagitis   . Duodenitis without hemorrhage   . Internal hemorrhoids   . Diverticulosis   . Sleep apnea   . Breast cancer   . GERD (gastroesophageal reflux disease)   . Adenomatous colon polyp   . Depression   . Obesity   . Hyperlipemia   . Hypertension   . CAD (coronary artery disease)   . Osteoarthritis   . Family history of malignant neoplasm of gastrointestinal tract   . Osteopenia 12/27/2011      Past Surgical History  Procedure Date  . Appendectomy   . Cholecystectomy   . Breast lumpectomy   . Tubal ligation   . Cervical fusion   . Lumbar laminectomy   . Carpal tunnel release     bilateral  . Cesarean section   . Shoulder surgery   . Foot surgery     Family History  Problem Relation Age of Onset  . Colon cancer Paternal Grandmother   . Heart disease Maternal Grandfather   . Heart disease Maternal Grandmother    Social History:  reports that she has been smoking Cigarettes.  She has been smoking about 1.5 packs per day. She does not have any smokeless tobacco history on file. She reports that she does not drink alcohol or use illicit drugs.  Allergies:  Allergies  Allergen Reactions  . Penicillins Other (See Comments)    Red bumps all over stomach    No prescriptions prior to admission    No results found for this or any previous visit (from the past 48 hour(s)). No results found.  ROS: + weight loss, + wears glasses, upper dentures, + COPD, + chest pain, No GI bleed, No hepatitis, No kidney stone, No stroke, No seizures, No psych admission.  Physical Exam: P-70, R-14, BP-125/78, Ht-62", Wt-175-lb. T-97,  BMI-35. HEENT:- [place]/AT Brown eyes, Wears reading glasses and upper dentures, Conj-pink, Sclera-white. Neck:- No JVD, no bruit. Lungs:- Clear, bil. Heart:- S1, S2 normal. III/VI systolic murmur. Abdomen: Soft, non-tender.Pelvic area scar of C-section. CNS:- Cranial nerves intact. Bil. Equal grips, Left handed. Skin:- Warm and dry.  Assessment/Plan CAD HTN COPD MR S/P Breast surgery  C. Cath today. Patient understood procedure, risk, benefits.  Kellsey Sansone S 09/30/2012, 6:14 AM

## 2012-10-01 LAB — CBC
MCHC: 32.9 g/dL (ref 30.0–36.0)
Platelets: 199 10*3/uL (ref 150–400)
RDW: 13.8 % (ref 11.5–15.5)
WBC: 9.2 10*3/uL (ref 4.0–10.5)

## 2012-10-01 LAB — BASIC METABOLIC PANEL
BUN: 9 mg/dL (ref 6–23)
GFR calc Af Amer: >90 mL/min (ref >90)
GFR calc non Af Amer: 89 mL/min — ABNORMAL LOW (ref >90)
Potassium: 3.6 mEq/L (ref 3.5–5.1)
Sodium: 137 mEq/L (ref 135–145)

## 2012-10-01 MED ORDER — ASPIRIN 81 MG PO TABS: 325.0000 mg | ORAL_TABLET | Freq: Every day | ORAL | Status: DC

## 2012-10-01 MED ORDER — TICAGRELOR 90 MG PO TABS: 90.0000 mg | ORAL_TABLET | Freq: Two times a day (BID) | ORAL | Status: DC

## 2012-10-01 MED FILL — Dextrose Inj 5%: INTRAVENOUS | Qty: 50 | Status: AC

## 2012-10-01 NOTE — Progress Notes (Signed)
CARDIAC REHAB PHASE I   PRE:  Rate/Rhythm: 69SR  BP:  Supine:   Sitting: 132/52  Standing:    SaO2:   MODE:  Ambulation: 700 ft   POST:  Rate/Rhythem: 83SR  BP:  Supine:   Sitting: 140/47  Standing:    SaO2:  0750-0849 Pt walked 700 ft with steady gait. Tolerated well. Denied chest pain. Education completed except exercise as pt to have staged intervention. Asked pt to ask cardiologist for activity restrictions. Pt gave permission for referral to [facility] Phase 2 knowing that she cannot begin until staged intervention. Encouraged smoking cessation. Gave fake cigarette. Has handouts and knows about [id number]. Pt uses herbal supplements. Encouraged her to discuss with cardiologist.  Duanne Limerick

## 2012-10-01 NOTE — Cardiovascular Report (Signed)
NAMEMarland Kitchen  SHIZA, THELEN NO.:  [id number]  MEDICAL RECORD NO.:  [id number]  LOCATION:  6525                         FACILITY:  [facility]  PHYSICIAN:  Eduardo Osier. Sharyn Lull, M.D. DATE OF BIRTH:  [birth year]  DATE OF PROCEDURE:  09/30/2012 DATE OF DISCHARGE:                           CARDIAC CATHETERIZATION   PROCEDURES: 1. Successful PTCA to proximal and ostial diagonal using 2.5 x 15-mm     long Trek balloon and then 3.0 x 8-mm long Trek balloon followed by     3.0 x 6-mm Flextome cutting balloon. 2. Successful PTCA to proximal LAD using 2.5 x 15-mm long Trek balloon     and then 3.0 x 15-mm long [place] Trek balloon for predilatation. 3. Successful deployment of 2.75 x 12-mm long Xience Xpedition drug-     eluting stent in ostial and proximal diagonal 1 using Minipress     technique. 4. Successful deployment of 3.0 x 23-mm long Xience Xpedition drug-     eluting stent in proximal LAD. 5. Successful postdilatation of this stent using 3.25 x 8-mm long [place]lin     Quantum balloon.  INDICATION FOR THE PROCEDURE:  Ms. Deuser is a 66 year old female with past medical history significant for coronary artery disease, history of myocardial infarction approximately 16 years ago, had PTCA and stenting to mid-LAD, hypertension, hypercholesteremia, tobacco abuse, degenerative joint disease, depression, history of cancer of breast, and GERD, was admitted by Dr. Algie Coffer because of her severe back pain associated with feeling weak.  Subsequently, the patient states she had similar symptoms when she had MI, subsequently had stress test yesterday, which showed anterior wall ischemia.  The patient was admitted as outpatient for elective cath, possible PCI.  The patient subsequently underwent left cardiac cath this morning by Dr. Algie Coffer, which showed critical complex bifurcation proximal LAD and ostial diagonal 1 stenosis.  Mid stent had 20-25% in-stent restenosis.  Left circumflex was patent.  OM 1  was moderate size, which was patent.  RCA has 75-80% proximal stenosis and 15-20% mid stenosis.  PLV branch has 60- 70% distal stenosis.  PLV branch has 60-70% stenosis.  PDA was small.  I was called for to do PCI to LAD/diagonal 1.  A 5-French arterial sheath was exchanged to 7-French arterial sheath.  Next, 7-French XB 3.5 LAD guiding catheter was advanced over the wire under fluoroscopic guidance up to the ascending aorta.  Wire was pulled out.  The catheter was aspirated and connected to the Manifold.  Catheter was further advanced and engaged into the left coronary ostium.  A single view of left coronary artery was obtained.  INTERVENTIONAL PROCEDURE:  Successful PTCA to ostial diagonal 1 was done using multiple balloons, i.e. 2.5 x 15-mm long Trek balloon and then 3.0 x 8-mm long Trek balloon.  Multiple inflations were done without much improvement in the lumen due to elastic recoil and then 3.0 x 6-mm long Flextome cutting balloon was used.  Multiple inflations were done using Flextome 3.0 x 6-mm long cutting balloon going up to 3-4 atmospheric pressure with 40-50% residual stenosis due to elastic recoil.  Next, 2.75 x 12-mm long Xience Xpedition drug-eluting stent was deployed in  proximal and ostial diagonal 1 using Minipress technique.  This stent was re-expanded using same balloon going up to 11 atmospheric pressure using kissing balloon technique.  Next, PTCA to proximal LAD was done initially using 2.5 x 15-mm long Trek balloon and then 3.0 x 15-mm long [place] Trek balloon going up to 12 atmospheric pressure and then, 3.0 x 15- mm long Xience Xpedition drug-eluting stent was deployed in proximal LAD going up to 11 atmospheric pressure.  The stent was postdilated using 3.25 x 8-mm long Quantum apex balloon going up to 18 atmospheric pressure.  Lesion was dilated from 80-85% in ostial diagonal 1 to less than 10% residual and LAD lesion dilated from 80-85% to 0% residual  with excellent TIMI grade 3 distal flow without any evidence of dissection or distal embolization in both the vessels.  The patient received weight based Angiomax, 180 mg of Brilinta prior to the procedure.  The patient tolerated the procedure well.  There were no complications.  The patient was transferred to the recovery room in stable condition.     Eduardo Osier. Sharyn Lull, M.D.     [facility]/MED[facility]  D:  09/30/2012  T:  10/01/2012  Job:  [id number]  cc:   [name], M.D.

## 2012-10-01 NOTE — Discharge Summary (Signed)
Physician Discharge Summary  Patient ID: Martha Mora MRN: [id number] DOB/AGE: November 05, 1946 66 y.o.  Admit date: 09/30/2012 Discharge date: 10/01/2012  Admission Diagnoses: CAD  HTN  COPD  MR  S/P Breast surgery  Discharge Diagnoses:  Active Problems:  *Acute coronary syndrome* Principle diagnosis  CAD, native vessel  Hypertension COPD  Mitral valve regurgitation Tobacco use disorder  S/P Breast surgery  Discharged Condition: good  Hospital Course: 66 years old female with known CAD and stent in LAD underwent coronary angiography. New LAD, diagonal and RCA 80 % stenoses were found. She had 2 vessel angioplasty. Her RCA angioplasty will be done in near future. She had no post procedure complication or groin hematoma. She was discharged home in stable condition with follow up in 1 week.   Consults: None  Significant Diagnostic Studies: labs: Near normal CBC and BMET and cardiac graphics: Cardiac cath showing severe LAD, diagonal and RCA disease.  Treatments: procedures: Left heart catheterization with LAD and diagonal vessel angioplasty.  Discharge Exam: Blood pressure 140/47, pulse 69, temperature 98.6 F (37 C), temperature source Oral, resp. rate 22, height 5' 2.5" (1.588 m), weight 77.2 kg (170 lb 3.1 oz), SpO2 95.00%.  HEENT:- [place]/AT Brown eyes, Wears reading glasses and upper dentures, Conj-pink, Sclera-white.  Neck:- No JVD, no bruit.  Lungs:- Clear, bil.  Heart:- S1, S2 normal. III/VI systolic murmur.  Abdomen: Soft, non-tender.Pelvic area scar of C-section.  CNS:- Cranial nerves intact. Bil. Equal grips, Left handed.  Ext:- Small ecchymosis right groin. Skin:- Warm and dry.  Disposition:   Discharge Orders    Future Appointments: Provider: Department: Dept Phone: Center:   05/03/2013 10:00 AM [facility] Room [facility] [place]  IMAGING [phone] GI-BREAST CE   06/29/2013 11:00 AM [facility] MEDICAL  ONCOLOGY [phone] None   06/29/2013 11:30 AM Pierce Crane, MD [facility] MEDICAL ONCOLOGY [phone] None     Future Orders Please Complete By Expires   Amb Referral to Cardiac Rehabilitation          Medication List     As of 10/01/2012  9:29 AM    STOP taking these medications         diclofenac 50 MG EC tablet   Commonly known as: VOLTAREN      TAKE these medications         ALPRAZolam 0.25 MG tablet   Commonly known as: XANAX   Take 0.25 mg by mouth at bedtime as needed. For sleep      amLODipine 5 MG tablet   Commonly known as: NORVASC   Take 5 mg by mouth daily.      aspirin 81 MG tablet   Take 4 tablets (325 mg total) by mouth daily. Tuesday, Thursday and saturday      B-complex with vitamin C tablet   Take 1 tablet by mouth daily.      Calcium Carbonate-Vitamin D 600-400 MG-UNIT per tablet   Take 1 tablet by mouth 2 (two) times daily.      citalopram 40 MG tablet   Commonly known as: CELEXA   Take 20 mg by mouth 2 (two) times daily.      diphenhydramine-acetaminophen 25-500 MG Tabs   Commonly known as: TYLENOL PM   Take 2 tablets by mouth at bedtime as needed. For sleep      ferrous sulfate 325 (65 FE) MG tablet   Take 325 mg by mouth daily with breakfast.  Fish Oil 1000 MG Caps   Take 1 capsule by mouth 3 (three) times daily.      Glucosamine-Chondroitin-MSM 375-300-250 MG Tabs   Take 1 tablet by mouth 2 (two) times daily.      GREEN TEA PO   Take 8 oz by mouth 2 (two) times daily.      Magnesium Oxide 500 MG Caps   Take 1 capsule by mouth daily.      multivitamin tablet   Take 1 tablet by mouth 3 (three) times daily. Herbal life multivitamin      omeprazole 40 MG capsule   Commonly known as: PRILOSEC   Take 1 capsule (40 mg total) by mouth daily.      OVER THE COUNTER MEDICATION   Take 1 tablet by mouth 3 (three) times daily. "Florafiber" with acidophilus      OVER THE COUNTER MEDICATION   Take 1 tablet by mouth  daily. Ocular defense      OVER THE COUNTER MEDICATION   Take 1 tablet by mouth 3 (three) times daily. Snack defense      OVER THE COUNTER MEDICATION   Take 0.5 tablets by mouth 3 (three) times daily. Total control      OVER THE COUNTER MEDICATION   Take 1 tablet by mouth daily. Triple berry      OVER THE COUNTER MEDICATION   Take 1 tablet by mouth 2 (two) times daily. Cell activator      OVER THE COUNTER MEDICATION   Take 1 tablet by mouth 3 (three) times daily. Cell-u-loss      OVER THE COUNTER MEDICATION   Take 1 tablet by mouth 3 (three) times daily. Aminogen      OVER THE COUNTER MEDICATION   Take 20 oz by mouth 2 (two) times daily. Protein powder shake called "Meal"      pravastatin 40 MG tablet   Commonly known as: PRAVACHOL   Take 40 mg by mouth daily.      tamoxifen 20 MG tablet   Commonly known as: NOLVADEX   TAKE 1 TABLET BY MOUTH EVERY DAY      temazepam 15 MG capsule   Commonly known as: RESTORIL   Take 30 mg by mouth at bedtime as needed. For sleep      Ticagrelor 90 MG Tabs tablet   Commonly known as: BRILINTA   Take 1 tablet (90 mg total) by mouth 2 (two) times daily.      traMADol 50 MG tablet   Commonly known as: ULTRAM   Take 100 mg by mouth at bedtime. Maximum dose= 8 tablets per day      vitamin C 500 MG tablet   Commonly known as: ASCORBIC ACID   Take 500 mg by mouth every evening.           Follow-up Information    Follow up with [facility] S, MD. Schedule an appointment as soon as possible for a visit in 1 week.   Contact information:   [address] [place] [address] [phone]          Signed: [name] 10/01/2012, 9:29 AM

## 2012-10-02 ENCOUNTER — Emergency Department (HOSPITAL_COMMUNITY): Payer: Medicare HMO

## 2012-10-02 ENCOUNTER — Other Ambulatory Visit: Payer: Self-pay

## 2012-10-02 ENCOUNTER — Emergency Department (HOSPITAL_COMMUNITY)
Admission: EM | Admit: 2012-10-02 | Discharge: 2012-10-02 | Disposition: A | Discharge status: Home / Self Care | Payer: Medicare HMO | Attending: Emergency Medicine | Admitting: Emergency Medicine

## 2012-10-02 ENCOUNTER — Encounter (HOSPITAL_COMMUNITY): Payer: Self-pay | Admitting: *Deleted

## 2012-10-02 DIAGNOSIS — R011 Cardiac murmur, unspecified: Secondary | ICD-10-CM | POA: Insufficient documentation

## 2012-10-02 DIAGNOSIS — I2510 Atherosclerotic heart disease of native coronary artery without angina pectoris: Secondary | ICD-10-CM | POA: Insufficient documentation

## 2012-10-02 DIAGNOSIS — Z955 Presence of coronary angioplasty implant and graft: Secondary | ICD-10-CM

## 2012-10-02 DIAGNOSIS — F411 Generalized anxiety disorder: Secondary | ICD-10-CM | POA: Insufficient documentation

## 2012-10-02 DIAGNOSIS — M949 Disorder of cartilage, unspecified: Secondary | ICD-10-CM | POA: Insufficient documentation

## 2012-10-02 DIAGNOSIS — E669 Obesity, unspecified: Secondary | ICD-10-CM | POA: Insufficient documentation

## 2012-10-02 DIAGNOSIS — G4730 Sleep apnea, unspecified: Secondary | ICD-10-CM | POA: Insufficient documentation

## 2012-10-02 DIAGNOSIS — I252 Old myocardial infarction: Secondary | ICD-10-CM | POA: Insufficient documentation

## 2012-10-02 DIAGNOSIS — M899 Disorder of bone, unspecified: Secondary | ICD-10-CM | POA: Insufficient documentation

## 2012-10-02 DIAGNOSIS — Z853 Personal history of malignant neoplasm of breast: Secondary | ICD-10-CM | POA: Insufficient documentation

## 2012-10-02 DIAGNOSIS — I10 Essential (primary) hypertension: Secondary | ICD-10-CM | POA: Insufficient documentation

## 2012-10-02 DIAGNOSIS — R072 Precordial pain: Secondary | ICD-10-CM | POA: Insufficient documentation

## 2012-10-02 DIAGNOSIS — K219 Gastro-esophageal reflux disease without esophagitis: Secondary | ICD-10-CM | POA: Insufficient documentation

## 2012-10-02 DIAGNOSIS — K648 Other hemorrhoids: Secondary | ICD-10-CM | POA: Insufficient documentation

## 2012-10-02 DIAGNOSIS — F3289 Other specified depressive episodes: Secondary | ICD-10-CM | POA: Insufficient documentation

## 2012-10-02 DIAGNOSIS — Z7982 Long term (current) use of aspirin: Secondary | ICD-10-CM | POA: Insufficient documentation

## 2012-10-02 DIAGNOSIS — Z8719 Personal history of other diseases of the digestive system: Secondary | ICD-10-CM | POA: Insufficient documentation

## 2012-10-02 DIAGNOSIS — K5732 Diverticulitis of large intestine without perforation or abscess without bleeding: Secondary | ICD-10-CM | POA: Insufficient documentation

## 2012-10-02 DIAGNOSIS — Z79899 Other long term (current) drug therapy: Secondary | ICD-10-CM | POA: Insufficient documentation

## 2012-10-02 DIAGNOSIS — R0789 Other chest pain: Secondary | ICD-10-CM | POA: Insufficient documentation

## 2012-10-02 DIAGNOSIS — E785 Hyperlipidemia, unspecified: Secondary | ICD-10-CM | POA: Insufficient documentation

## 2012-10-02 DIAGNOSIS — Z9861 Coronary angioplasty status: Secondary | ICD-10-CM | POA: Insufficient documentation

## 2012-10-02 DIAGNOSIS — F329 Major depressive disorder, single episode, unspecified: Secondary | ICD-10-CM | POA: Insufficient documentation

## 2012-10-02 LAB — CBC WITH DIFFERENTIAL/PLATELET
Basophils Relative: 1 % (ref 0–1)
Eosinophils Absolute: 0.3 10*3/uL (ref 0.0–0.7)
Lymphs Abs: 1.9 10*3/uL (ref 0.7–4.0)
MCH: 30.8 pg (ref 26.0–34.0)
Neutro Abs: 5.7 10*3/uL (ref 1.7–7.7)
Neutrophils Relative %: 68 % (ref 43–77)
Platelets: 259 10*3/uL (ref 150–400)
RBC: 3.93 MIL/uL (ref 3.87–5.11)
WBC: 8.4 10*3/uL (ref 4.0–10.5)

## 2012-10-02 LAB — COMPREHENSIVE METABOLIC PANEL
ALT: 6 U/L (ref 0–35)
Albumin: 3 g/dL — ABNORMAL LOW (ref 3.5–5.2)
Alkaline Phosphatase: 50 U/L (ref 39–117)
Glucose, Bld: 96 mg/dL (ref 70–99)
Potassium: 3.8 mEq/L (ref 3.5–5.1)
Sodium: 139 mEq/L (ref 135–145)
Total Protein: 5.8 g/dL — ABNORMAL LOW (ref 6.0–8.3)

## 2012-10-02 LAB — URINALYSIS, ROUTINE W REFLEX MICROSCOPIC
Glucose, UA: NEGATIVE mg/dL
Leukocytes, UA: NEGATIVE
pH: 5.5 (ref 5.0–8.0)

## 2012-10-02 LAB — URINE MICROSCOPIC-ADD ON

## 2012-10-02 MED ORDER — VANCOMYCIN HCL IN DEXTROSE 1-5 GM/200ML-% IV SOLN
1000.0000 mg | Freq: Once | INTRAVENOUS | Status: AC
  Administered 2012-10-02: 1000 mg via INTRAVENOUS
  Filled 2012-10-02: qty 200

## 2012-10-02 MED ORDER — ASPIRIN 81 MG PO CHEW: 324.0000 mg | CHEWABLE_TABLET | Freq: Once | ORAL | Status: AC

## 2012-10-02 MED ORDER — MOXIFLOXACIN HCL 400 MG PO TABS: 400.0000 mg | ORAL_TABLET | Freq: Every day | ORAL | Status: DC

## 2012-10-02 NOTE — ED Provider Notes (Signed)
5:43 PM Patient moved to CDU holding for repeat troponin at 6:15pm, then phone consultation with Dr Algie Coffer to decide disposition.  Sign out received from Dr Manus Gunning.  Pt had cardiac cath 2 days ago with stent placement, presented today with anxious feeling and chest pressure.  Pt has been given IV vancomycin, first troponin negative.  Will call Dr Algie Coffer following second troponin.  Pt reports she is feeling well and has no chest pain or pressure at present.  Pt is A&O, NAD, RRR, no m/r/g, CTAB.   7:50 PM I spoke with Dr Sharyn Lull about the patient.  He recommends discharge home, continuing home medications, add Avelox 400mg  PO daily x 7 days, will see Dr Algie Coffer on Monday, to return to the ER immediately with any chest pain.  I have discussed this plan with patient who agrees and verbalizes understanding.  States she already has an appointment with Dr Algie Coffer on Monday.    Results for orders placed during the hospital encounter of 10/02/12  CBC WITH DIFFERENTIAL      Component Value Range   WBC 8.4  4.0 - 10.5 K/uL   RBC 3.93  3.87 - 5.11 MIL/uL   Hemoglobin 12.1  12.0 - 15.0 g/dL   HCT 16.1 (*) 09.6 - 04.5 %   MCV 90.3  78.0 - 100.0 fL   MCH 30.8  26.0 - 34.0 pg   MCHC 34.1  30.0 - 36.0 g/dL   RDW 40.9  81.1 - 91.4 %   Platelets 259  150 - 400 K/uL   Neutrophils Relative 68  43 - 77 %   Neutro Abs 5.7  1.7 - 7.7 K/uL   Lymphocytes Relative 22  12 - 46 %   Lymphs Abs 1.9  0.7 - 4.0 K/uL   Monocytes Relative 6  3 - 12 %   Monocytes Absolute 0.5  0.1 - 1.0 K/uL   Eosinophils Relative 4  0 - 5 %   Eosinophils Absolute 0.3  0.0 - 0.7 K/uL   Basophils Relative 1  0 - 1 %   Basophils Absolute 0.1  0.0 - 0.1 K/uL  COMPREHENSIVE METABOLIC PANEL      Component Value Range   Sodium 139  135 - 145 mEq/L   Potassium 3.8  3.5 - 5.1 mEq/L   Chloride 107  96 - 112 mEq/L   CO2 23  19 - 32 mEq/L   Glucose, Bld 96  70 - 99 mg/dL   BUN 11  6 - 23 mg/dL   Creatinine, Ser 7.82  0.50 - 1.10 mg/dL   Calcium 9.1  8.4 - 95.6 mg/dL   Total Protein 5.8 (*) 6.0 - 8.3 g/dL   Albumin 3.0 (*) 3.5 - 5.2 g/dL   AST 12  0 - 37 U/L   ALT 6  0 - 35 U/L   Alkaline Phosphatase 50  39 - 117 U/L   Total Bilirubin 0.2 (*) 0.3 - 1.2 mg/dL   GFR calc non Af Amer >90  >90 mL/min   GFR calc Af Amer >90  >90 mL/min  TROPONIN I      Component Value Range   Troponin I <0.30  <0.30 ng/mL  PROTIME-INR      Component Value Range   Prothrombin Time 13.4  11.6 - 15.2 seconds   INR 1.03  0.00 - 1.49  URINALYSIS, ROUTINE W REFLEX MICROSCOPIC      Component Value Range   Color, Urine YELLOW  YELLOW   APPearance  CLEAR  CLEAR   Specific Gravity, Urine 1.011  1.005 - 1.030   pH 5.5  5.0 - 8.0   Glucose, UA NEGATIVE  NEGATIVE mg/dL   Hgb urine dipstick SMALL (*) NEGATIVE   Bilirubin Urine NEGATIVE  NEGATIVE   Ketones, ur NEGATIVE  NEGATIVE mg/dL   Protein, ur NEGATIVE  NEGATIVE mg/dL   Urobilinogen, UA 0.2  0.0 - 1.0 mg/dL   Nitrite NEGATIVE  NEGATIVE   Leukocytes, UA NEGATIVE  NEGATIVE  URINE MICROSCOPIC-ADD ON      Component Value Range   Squamous Epithelial / LPF FEW (*) RARE   WBC, UA 0-2  <3 WBC/hpf   RBC / HPF 0-2  <3 RBC/hpf   Bacteria, UA RARE  RARE  TROPONIN I      Component Value Range   Troponin I <0.30  <0.30 ng/mL   Dg Chest 2 View  10/02/2012  *RADIOLOGY REPORT*  Clinical Data: Hypertension, COPD, chest pain, coronary disease post stenting  CHEST - 2 VIEW  Comparison: 05/20/2007  Findings: Upper-normal size of cardiac silhouette. Mediastinal contours and pulmonary vascularity normal. Lungs clear. No pleural effusion or pneumothorax. Surgical clips right axilla. Prior cervical spine fusion. Biconvex thoracolumbar scoliosis.  IMPRESSION: No acute abnormalities.   Original Report Authenticated By: Ulyses Southward, M.D.       [place], [place] 10/02/12 2310

## 2012-10-02 NOTE — ED Notes (Signed)
Per EMS- pt began having chest pressure this morning. Pt had a stent placed on Wednesday. Pt is to have cath in 2 weeks for 2 more blockages. Denies pressure or pain at this time. Pt states that she drank green tea and took "total control herbal supplement" this morning as well. Pt received 324mg  of asprin. No nitro given. BP 140/88. HR 60's NSR with EMS.
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
## 2012-10-02 NOTE — ED Provider Notes (Signed)
Medical screening examination/treatment/procedure(s) were conducted as a shared visit with non-physician practitioner(s) and myself.  I personally evaluated the patient during the encounter   Martha Octave, MD 10/02/12 [phone]
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
## 2012-10-06 ENCOUNTER — Other Ambulatory Visit: Payer: Self-pay | Admitting: Internal Medicine

## 2012-10-07 NOTE — Telephone Encounter (Signed)
Refilled Omeprazole 

## 2012-11-03 ENCOUNTER — Other Ambulatory Visit: Payer: Self-pay | Admitting: Internal Medicine

## 2012-11-04 ENCOUNTER — Other Ambulatory Visit: Payer: Self-pay | Admitting: Internal Medicine

## 2013-01-05 ENCOUNTER — Encounter (HOSPITAL_COMMUNITY): Payer: Self-pay | Admitting: Pharmacy Technician

## 2013-01-06 ENCOUNTER — Observation Stay (HOSPITAL_COMMUNITY)
Admission: RE | Admit: 2013-01-06 | Discharge: 2013-01-07 | Disposition: A | Discharge status: Home / Self Care | Payer: Medicare HMO | Source: Ambulatory Visit | Attending: Cardiovascular Disease | Admitting: Cardiovascular Disease

## 2013-01-06 ENCOUNTER — Encounter (HOSPITAL_COMMUNITY)
Admission: RE | Disposition: A | Discharge status: Home / Self Care | Payer: Self-pay | Source: Ambulatory Visit | Attending: Cardiovascular Disease

## 2013-01-06 ENCOUNTER — Ambulatory Visit (HOSPITAL_COMMUNITY): Admit: 2013-01-06 | Payer: Self-pay | Admitting: Cardiovascular Disease

## 2013-01-06 DIAGNOSIS — Z955 Presence of coronary angioplasty implant and graft: Secondary | ICD-10-CM

## 2013-01-06 DIAGNOSIS — I2510 Atherosclerotic heart disease of native coronary artery without angina pectoris: Principal | ICD-10-CM | POA: Insufficient documentation

## 2013-01-06 DIAGNOSIS — I059 Rheumatic mitral valve disease, unspecified: Secondary | ICD-10-CM | POA: Insufficient documentation

## 2013-01-06 DIAGNOSIS — J4489 Other specified chronic obstructive pulmonary disease: Secondary | ICD-10-CM | POA: Insufficient documentation

## 2013-01-06 DIAGNOSIS — J449 Chronic obstructive pulmonary disease, unspecified: Secondary | ICD-10-CM | POA: Insufficient documentation

## 2013-01-06 DIAGNOSIS — I10 Essential (primary) hypertension: Secondary | ICD-10-CM | POA: Insufficient documentation

## 2013-01-06 DIAGNOSIS — Z9861 Coronary angioplasty status: Secondary | ICD-10-CM | POA: Insufficient documentation

## 2013-01-06 DIAGNOSIS — F17200 Nicotine dependence, unspecified, uncomplicated: Secondary | ICD-10-CM | POA: Insufficient documentation

## 2013-01-06 DIAGNOSIS — Z853 Personal history of malignant neoplasm of breast: Secondary | ICD-10-CM | POA: Insufficient documentation

## 2013-01-06 DIAGNOSIS — E785 Hyperlipidemia, unspecified: Secondary | ICD-10-CM | POA: Insufficient documentation

## 2013-01-06 HISTORY — PX: LEFT HEART CATHETERIZATION WITH CORONARY ANGIOGRAM: SHX5451

## 2013-01-06 LAB — CBC
MCH: 31 pg (ref 26.0–34.0)
MCV: 91.2 fL (ref 78.0–100.0)
Platelets: 298 10*3/uL (ref 150–400)
RBC: 4.42 MIL/uL (ref 3.87–5.11)
RDW: 14.8 % (ref 11.5–15.5)

## 2013-01-06 LAB — BASIC METABOLIC PANEL
BUN: 18 mg/dL (ref 6–23)
Calcium: 9.3 mg/dL (ref 8.4–10.5)
Creatinine, Ser: 0.64 mg/dL (ref 0.50–1.10)
GFR calc Af Amer: >90 mL/min (ref >90)
GFR calc non Af Amer: 89 mL/min — ABNORMAL LOW (ref >90)
Glucose, Bld: 96 mg/dL (ref 70–99)
Potassium: 4.6 mEq/L (ref 3.5–5.1)

## 2013-01-06 LAB — POCT ACTIVATED CLOTTING TIME: Activated Clotting Time: 377 seconds

## 2013-01-06 LAB — PROTIME-INR: Prothrombin Time: 12.3 seconds (ref 11.6–15.2)

## 2013-01-06 SURGERY — LEFT HEART CATHETERIZATION WITH CORONARY ANGIOGRAM
Anesthesia: Moderate Sedation

## 2013-01-06 SURGERY — LEFT HEART CATHETERIZATION WITH CORONARY ANGIOGRAM
Anesthesia: LOCAL

## 2013-01-06 MED ORDER — CITALOPRAM HYDROBROMIDE 20 MG PO TABS
20.0000 mg | ORAL_TABLET | Freq: Two times a day (BID) | ORAL | Status: DC
  Administered 2013-01-06 – 2013-01-07 (×3): 20 mg via ORAL
  Filled 2013-01-06 (×4): qty 1

## 2013-01-06 MED ORDER — DIPHENHYDRAMINE HCL 50 MG/ML IJ SOLN
25.0000 mg | Freq: Once | INTRAMUSCULAR | Status: AC
  Administered 2013-01-06: 25 mg via INTRAVENOUS

## 2013-01-06 MED ORDER — DIAZEPAM 5 MG PO TABS
ORAL_TABLET | ORAL | Status: AC
  Filled 2013-01-06: qty 1

## 2013-01-06 MED ORDER — MIDAZOLAM HCL 2 MG/2ML IJ SOLN
INTRAMUSCULAR | Status: AC
  Filled 2013-01-06: qty 2

## 2013-01-06 MED ORDER — SODIUM CHLORIDE 0.9 % IJ SOLN: 3.0000 mL | INTRAMUSCULAR | Status: DC | PRN

## 2013-01-06 MED ORDER — DIPHENHYDRAMINE HCL 50 MG/ML IJ SOLN
INTRAMUSCULAR | Status: AC
  Filled 2013-01-06: qty 1

## 2013-01-06 MED ORDER — SODIUM CHLORIDE 0.9 % IJ SOLN: 3.0000 mL | Freq: Two times a day (BID) | INTRAMUSCULAR | Status: DC

## 2013-01-06 MED ORDER — ASPIRIN 81 MG PO CHEW
CHEWABLE_TABLET | ORAL | Status: AC
  Filled 2013-01-06: qty 4

## 2013-01-06 MED ORDER — SODIUM CHLORIDE 0.9 % IV SOLN
INTRAVENOUS | Status: DC
  Administered 2013-01-06: 1000 mL via INTRAVENOUS

## 2013-01-06 MED ORDER — TICAGRELOR 90 MG PO TABS
90.0000 mg | ORAL_TABLET | Freq: Two times a day (BID) | ORAL | Status: DC
  Administered 2013-01-06 – 2013-01-07 (×2): 90 mg via ORAL
  Filled 2013-01-06 (×3): qty 1

## 2013-01-06 MED ORDER — PANTOPRAZOLE SODIUM 40 MG PO TBEC
40.0000 mg | DELAYED_RELEASE_TABLET | Freq: Every day | ORAL | Status: DC
  Administered 2013-01-06: 15:00:00 40 mg via ORAL
  Filled 2013-01-06: qty 1

## 2013-01-06 MED ORDER — DIAZEPAM 5 MG PO TABS
5.0000 mg | ORAL_TABLET | ORAL | Status: AC
  Administered 2013-01-06: 5 mg via ORAL

## 2013-01-06 MED ORDER — TICAGRELOR 90 MG PO TABS
ORAL_TABLET | ORAL | Status: AC
  Filled 2013-01-06: qty 1

## 2013-01-06 MED ORDER — ACETAMINOPHEN 325 MG PO TABS
650.0000 mg | ORAL_TABLET | ORAL | Status: DC | PRN
  Administered 2013-01-07: 650 mg via ORAL
  Filled 2013-01-06: qty 2

## 2013-01-06 MED ORDER — FENTANYL CITRATE 0.05 MG/ML IJ SOLN
INTRAMUSCULAR | Status: AC
  Filled 2013-01-06: qty 2

## 2013-01-06 MED ORDER — ASPIRIN 81 MG PO CHEW
81.0000 mg | CHEWABLE_TABLET | Freq: Every day | ORAL | Status: DC
  Administered 2013-01-07: 10:00:00 81 mg via ORAL
  Filled 2013-01-06: qty 1

## 2013-01-06 MED ORDER — BIVALIRUDIN 250 MG IV SOLR
1.7500 mg/kg/h | INTRAVENOUS | Status: DC
  Administered 2013-01-06: 1.75 mg/kg/h via INTRAVENOUS
  Filled 2013-01-06: qty 250

## 2013-01-06 MED ORDER — SODIUM CHLORIDE 0.9 % IV SOLN
INTRAVENOUS | Status: AC
  Administered 2013-01-06: 11:00:00 via INTRAVENOUS

## 2013-01-06 MED ORDER — SODIUM CHLORIDE 0.9 % IV BOLUS (SEPSIS)
250.0000 mL | Freq: Once | INTRAVENOUS | Status: AC
  Administered 2013-01-06: 12:00:00 via INTRAVENOUS

## 2013-01-06 MED ORDER — SODIUM CHLORIDE 0.9 % IV SOLN: 250.0000 mL | INTRAVENOUS | Status: DC | PRN

## 2013-01-06 MED ORDER — METHYLPREDNISOLONE SODIUM SUCC 125 MG IJ SOLR
INTRAMUSCULAR | Status: AC
  Filled 2013-01-06: qty 2

## 2013-01-06 MED ORDER — FAMOTIDINE IN NACL 20-0.9 MG/50ML-% IV SOLN
20.0000 mg | Freq: Two times a day (BID) | INTRAVENOUS | Status: DC
  Administered 2013-01-06 (×2): 20 mg via INTRAVENOUS
  Filled 2013-01-06 (×3): qty 50

## 2013-01-06 MED ORDER — ONDANSETRON HCL 4 MG/2ML IJ SOLN: 4.0000 mg | Freq: Four times a day (QID) | INTRAMUSCULAR | Status: DC | PRN

## 2013-01-06 MED ORDER — METHYLPREDNISOLONE SODIUM SUCC 125 MG IJ SOLR
125.0000 mg | Freq: Once | INTRAMUSCULAR | Status: AC
  Administered 2013-01-06: 125 mg via INTRAVENOUS

## 2013-01-06 MED ORDER — ATORVASTATIN CALCIUM 40 MG PO TABS
40.0000 mg | ORAL_TABLET | Freq: Every day | ORAL | Status: DC
  Filled 2013-01-06 (×2): qty 1

## 2013-01-06 MED ORDER — ASPIRIN 81 MG PO CHEW
324.0000 mg | CHEWABLE_TABLET | ORAL | Status: AC
  Administered 2013-01-06: 324 mg via ORAL

## 2013-01-06 MED ORDER — NITROGLYCERIN IN D5W 200-5 MCG/ML-% IV SOLN: 5.0000 ug/min | INTRAVENOUS | Status: DC

## 2013-01-06 MED ORDER — NITROGLYCERIN 1 MG/10 ML FOR IR/CATH LAB
INTRA_ARTERIAL | Status: AC
  Filled 2013-01-06: qty 10

## 2013-01-06 MED ORDER — METOPROLOL TARTRATE 12.5 MG HALF TABLET
12.5000 mg | ORAL_TABLET | Freq: Two times a day (BID) | ORAL | Status: DC
  Administered 2013-01-06: 22:00:00 12.5 mg via ORAL
  Filled 2013-01-06 (×4): qty 1

## 2013-01-06 MED ORDER — BIVALIRUDIN 250 MG IV SOLR
INTRAVENOUS | Status: AC
  Filled 2013-01-06: qty 250

## 2013-01-06 MED ORDER — FAMOTIDINE IN NACL 20-0.9 MG/50ML-% IV SOLN
INTRAVENOUS | Status: AC
  Filled 2013-01-06: qty 50

## 2013-01-06 NOTE — Progress Notes (Signed)
Site area: right groin  Site Prior to Removal:  Level 0  Pressure Applied For 20 MINUTES    Minutes Beginning at 1340  Manual:   yes  Patient Status During Pull:  stable  Post Pull Groin Site:  Level 0  Post Pull Instructions Given:  yes  Post Pull Pulses Present:  yes  Dressing Applied:  yes  Comments:   

## 2013-01-06 NOTE — CV Procedure (Signed)
PTCA stenting report dictated on 219 14 dictation number is [id number]

## 2013-01-06 NOTE — Op Note (Signed)
NAME:  Martha Mora, DINA NO.:  [id number]  MEDICAL RECORD NO.:  [id number]  LOCATION:  6527                         FACILITY:  [facility]  PHYSICIAN:  Eduardo Osier. Sharyn Lull, M.D. DATE OF BIRTH:  Apr 24, 1946  DATE OF PROCEDURE:  01/06/2013 DATE OF DISCHARGE:                              OPERATIVE REPORT   PROCEDURE: 1. Successful percutaneous transluminal coronary angioplasty to     proximal and proximal and mid-junction of right coronary artery     using 2.5 x 12-mm long Emerge balloon. 2. Successful percutaneous transluminal coronary angioplasty to     proximal and proximal and mid-junction of the right coronary artery     using 3.25 x 15 mm long [place] Quantum apex balloon. 3. Successful deployment of 3.0 x 33 mm long Xience Xpedition drug-     eluting stent in proximal and proximal and mid-junction of right     coronary artery. 4. Successful postdilatation of this stent using 3.5 x 20 mm long [place]     Quantum apex balloon going up to 15 atmospheric pressure.  INDICATION FOR THE PROCEDURE:  [name] is a 67 year old female with past medical history significant for coronary artery disease, history of MI in the past, had PTCA and stenting to mid LAD in the past approximately 16 years ago, and then had PTCA and stenting to proximal LAD in November 2013.  She had 3.0 x 23 mm long Xience Xpedition drug- eluting stent in proximal LAD and 2.75 x 12-mm long Xience Xpedition drug-eluting stent in ostial diagonal 1 in November 2013, hypertension, history of hypercholesteremia, history of tobacco abuse, degenerative joint disease, depression, COPD, history of CA of breast, GERD was admitted by Dr. Algie Coffer today because of recurrent chest pain, and subsequently had cardiac catheterization earlier today by Dr. Algie Coffer which showed good LV systolic function, left main was patent,  LAD was patent at prior PTCA stented site, ostial diagonal 1 has 20-30% restenosis with good TIMI grade 3  distal flow.  Diagonal 2 was small which was patent.  LAD distally was diffusely diseased as before.  Left circumflex was patent.  The obtuse marginal 1 was large which was patent.  RCA has 85-90% proximal and proximal and mid-junction stenosis with 30-40% mid and distal focal stenosis.  PLV branch has 30-40% mid stenosis which is a large vessel.  PDA is small which was patent.  I was called for PCI to RCA.  INTERVENTIONAL PROCEDURE:  After obtaining the informed consent, a 5- [place] arterial sheath was switched to 6-French sheath.  Next, 6-French AL 2 guiding catheter was advanced over the wire under fluoroscopic guidance up to the ascending aorta.  Wire was pulled out.  The catheter was aspirated and connected to the Manifold.  Catheter was further advanced and engaged into right coronary ostium.  A single view of right coronary artery was obtained.  Findings were same as above.  Successful PTCA to proximal and proximal and mid-junction of RCA was done using initially 2.5 x 12 mm long Emerge balloon going up to 6-8 atmospheric pressure, and then 3.25 x 15-mm long [place] Quantum apex balloon was used for predilatation going up  to 8 atmospheric pressure.  Lesion dilated from 85-90% to less than 30% residual and then 3.0 x 33 mm long Xience Xpedition drug-eluting stent was deployed in proximal and proximal and mid-junction of RCA at 11 atmospheric pressure.  The stent was post dilated using 3.5 x 20 mm long [place] Quantum apex balloon going up to 15 atmospheric pressure.  Lesion dilated from 85-90% to 0% residual with excellent TIMI grade 3 distal flow without evidence of dissection or distal embolization. The patient received weight based Angiomax and 180 mg of Brilinta during the procedure.  The patient tolerated the procedure well.  There were no complications.  The patient was transferred to recovery room in stable condition.     Eduardo Osier. Sharyn Lull, M.D.     [facility]  D:  01/06/2013   T:  01/06/2013  Job:  [id number]
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
## 2013-01-06 NOTE — CV Procedure (Signed)
PROCEDURE:  Left heart catheterization with selective coronary angiography, left ventriculogram.  CLINICAL HISTORY:  This is a 67 years old female with known CAD and LAD/Diag stent has recurrent chest pain.  The risks, benefits, and details of the procedure were explained to the patient.  The patient verbalized understanding and wanted to proceed.  Informed written consent was obtained.  PROCEDURE TECHNIQUE:  The patient was approached from the right femoral artery using a 5 French short sheath.  Left coronary angiography was done using a Judkins L4 guide catheter.  Right coronary angiography was done using a Judkins R4 guide catheter.  Left ventriculography was done using a pigtail catheter.    CONTRAST:  Total of 70 cc.  COMPLICATIONS:  None.  At the end of the procedure a manual device was used for hemostasis.    HEMODYNAMICS:  Aortic pressure was 115/50; LV pressure was 120/3; LVEDP 10.  There was no gradient between the left ventricle and aorta.    ANGIOGRAM/CORONARY ARTERIOGRAM:   The left main coronary artery is unremarkable.  The left anterior descending artery has luminal irregularities with patent proximal stent. Diagonal 1 has mild osteal disease otherwise patent stent and unremarkable.  The left circumflex artery has proximal mild concentric narrowing otherwise unremarkable.  The right coronary artery is dominant and has proximal 90 % stenosis.  LEFT VENTRICULOGRAM:  Left ventricular angiogram was done in the 30 RAO projection and revealed normal left ventricular wall motion and systolic function with an estimated ejection fraction of 70%.  LVEDP was 10 mmHg.  IMPRESSION OF HEART CATHETERIZATION:   1. Normal left main coronary artery. 2. Minimal left anterior descending artery and its branches disease with patent LAD and diagonal stent. 3. Mild left circumflex artery diseasecand its branches. 4. Severe right coronary artery disease. 5. Normal left ventricular systolic  function.  LVEDP 10 mmHg.  Ejection fraction 70%.  RECOMMENDATION:   PTCA RCA/Dr. Sharyn Lull.[name]

## 2013-01-07 ENCOUNTER — Encounter (HOSPITAL_COMMUNITY): Payer: Self-pay | Admitting: *Deleted

## 2013-01-07 LAB — BASIC METABOLIC PANEL
BUN: 14 mg/dL (ref 6–23)
Calcium: 8.4 mg/dL (ref 8.4–10.5)
GFR calc Af Amer: >90 mL/min (ref >90)
GFR calc non Af Amer: >90 mL/min (ref >90)
Potassium: 3.9 mEq/L (ref 3.5–5.1)
Sodium: 141 mEq/L (ref 135–145)

## 2013-01-07 LAB — CBC
MCHC: 33.1 g/dL (ref 30.0–36.0)
RDW: 14.9 % (ref 11.5–15.5)

## 2013-01-07 MED ORDER — FAMOTIDINE 20 MG PO TABS
20.0000 mg | ORAL_TABLET | Freq: Two times a day (BID) | ORAL | Status: DC
  Filled 2013-01-07 (×2): qty 1

## 2013-01-07 MED FILL — Dextrose Inj 5%: INTRAVENOUS | Qty: 50 | Status: AC

## 2013-01-07 NOTE — Discharge Summary (Signed)
Physician Discharge Summary  Patient ID: Martha Mora MRN: [id number] DOB/AGE: Jul 29, 1946 67 y.o.  Admit date: 01/06/2013 Discharge date: 01/07/2013  Admission Diagnoses: CAD  HTN  COPD  MR  S/P Breast surgery  Discharge Diagnoses:  Principle Problem: * Unstable angina * CAD  S/P RCA angioplasty S/P LAD angioplasty Hypertension, essential COPD  Mitral regurgitation S/P Breast cancer S/P Breast surgery   Discharged Condition: good  Hospital Course: 67 years old female with recurrent chest pain has multivessel CAD. She had angioplasty on her LAD/Diag. vessel 3-4 months ago. She has risk factor of hypertension and smoking. Her cardiac cath showed severe proximal RCA stenosis. This was treated with 3.0 x 33 mm long Xience Xpedition drug eluting stent with TIMI grade III flow. She had no post procedure complication and was discharged home in stable condition.  Consults: cardiology  Significant Diagnostic Studies: labs: Near normal CBC and BMET.  Coronary Angiography: 1. Normal left main coronary artery. 2. Minimal left anterior descending artery and its branches disease with patent LAD and diagonal stent. 3. Mild left circumflex artery diseasecand its branches. 4. Severe right coronary artery disease. 5. Normal left ventricular systolic function. LVEDP 10 mmHg. Ejection fraction 70%.   RCA angioplasty: 1. Successful percutaneous transluminal coronary angioplasty to proximal and proximal and mid-junction of right coronary artery using 2.5 x 12-mm long Emerge balloon.  2. Successful percutaneous transluminal coronary angioplasty to proximal and proximal and mid-junction of the right coronary artery using 3.25 x 15 mm long [place] Quantum apex balloon.  3. Successful deployment of 3.0 x 33 mm long Xience Xpedition drug-eluting stent in proximal and proximal and mid-junction of right coronary artery.  4. Successful postdilatation of this stent using 3.5 x 20 mm long [place] Quantum apex  balloon going up to 15 atmospheric pressure.  Treatments: procedures: RCA angioplasty as above.  Discharge Exam: Blood pressure 120/47, pulse 57, temperature 98.1 F (36.7 C), temperature source Oral, resp. rate 20, height 5\' 2"  (1.575 m), weight 76.2 kg (167 lb 15.9 oz), SpO2 95.00%.  HEENT:- [place]/AT Brown eyes, Wears reading glasses and upper dentures, Conj-pink, Sclera-white.  Neck:- No JVD, no bruit.  Lungs:- Clear, bil.  Heart:- S1, S2 normal. III/VI systolic murmur.  Abdomen: Soft, non-tender.Pelvic area scar of C-section.  Ext: No hematoma. CNS:- Cranial nerves intact. Bil. equal grips, Left handed.  Skin:- Warm and dry.  Disposition: 01-Home or Self Care   Future Appointments Provider Department Dept Phone   05/03/2013 10:00 AM [facility] [phone]   Patient should wear two piece clothing and wear no powder or deodorant. Patient should arrive 15 minutes early.   06/29/2013 11:00 AM [facility] MEDICAL ONCOLOGY [phone]   06/29/2013 11:30 AM Pierce Crane, MD [facility] MEDICAL ONCOLOGY [phone]       Medication List    TAKE these medications       amLODipine 5 MG tablet  Commonly known as:  NORVASC  Take 5 mg by mouth at bedtime.     aspirin 81 MG tablet  Take 325 mg by mouth daily.     B-complex with vitamin C tablet  Take 1 tablet by mouth daily.     Calcium Carbonate-Vitamin D 600-400 MG-UNIT per tablet  Take 1 tablet by mouth 2 (two) times daily.     citalopram 40 MG tablet  Commonly known as:  CELEXA  Take 20 mg by mouth 2 (  two) times daily.     diphenhydramine-acetaminophen 25-500 MG Tabs  Commonly known as:  TYLENOL PM  Take 2 tablets by mouth at bedtime as needed. For sleep     ferrous sulfate 325 (65 FE) MG tablet  Take 325 mg by mouth 3 (three) times a week.     Fish Oil 1000 MG Caps  Take 1 capsule by mouth 3 (three) times daily.      Glucosamine-Chondroitin-MSM 375-300-250 MG Tabs  Take 1 tablet by mouth 2 (two) times daily.     GREEN TEA PO  Take 8 oz by mouth 2 (two) times daily.     Magnesium Oxide 500 MG Caps  Take 1 capsule by mouth daily.     multivitamin tablet  Take 1 tablet by mouth 3 (three) times daily. Herbal life multivitamin     omeprazole 40 MG capsule  Commonly known as:  PRILOSEC  TAKE 1 CAPSULE (40 MG TOTAL) BY MOUTH DAILY.     OVER THE COUNTER MEDICATION  Take 1 tablet by mouth 3 (three) times daily. "Florafiber" with acidophilus     OVER THE COUNTER MEDICATION  Take 1 tablet by mouth daily. Ocular defense     pravastatin 40 MG tablet  Commonly known as:  PRAVACHOL  Take 40 mg by mouth daily.     tamoxifen 20 MG tablet  Commonly known as:  NOLVADEX  TAKE 1 TABLET BY MOUTH EVERY DAY     temazepam 15 MG capsule  Commonly known as:  RESTORIL  Take 30 mg by mouth at bedtime as needed. For sleep     Ticagrelor 90 MG Tabs tablet  Commonly known as:  BRILINTA  Take 1 tablet (90 mg total) by mouth 2 (two) times daily.     traMADol 50 MG tablet  Commonly known as:  ULTRAM  Take 100 mg by mouth at bedtime. Maximum dose= 8 tablets per day     vitamin C 500 MG tablet  Commonly known as:  ASCORBIC ACID  Take 500 mg by mouth every evening.     zolendronic acid 4 MG/5ML injection  Commonly known as:  ZOMETA  Inject 4 mg into the vein every 6 (six) months.         SignedOrpah Cobb S 01/07/2013, 9:16 AM

## 2013-01-07 NOTE — Progress Notes (Signed)
Cardiac Rehab [phone] Pt states that she has walked in hall denies any problems. Reviewed discharge education with pt and husband. She voices understanding  Encouraged smoking cessation with pt and gave her tips for quitting and coaching contacts numbers. She voices desire to quit, but has not been successful. She agress to [employer]. CRP in [place], will send referral.

## 2013-02-06 ENCOUNTER — Telehealth: Payer: Self-pay | Admitting: Oncology

## 2013-02-06 NOTE — Telephone Encounter (Signed)
S/W PT IN RE TO PROVIDER CHANGE AND APPT TO 10:30 LAB, 11 JACKIE HUNTER.  LETTER/MAILED.

## 2013-02-08 ENCOUNTER — Encounter: Payer: Self-pay | Admitting: Oncology

## 2013-04-26 ENCOUNTER — Telehealth: Payer: Self-pay | Admitting: Oncology

## 2013-05-03 ENCOUNTER — Ambulatory Visit
Admission: RE | Admit: 2013-05-03 | Discharge: 2013-05-03 | Disposition: A | Discharge status: Home / Self Care | Payer: Medicare HMO | Source: Ambulatory Visit | Attending: Oncology | Admitting: Oncology

## 2013-05-03 DIAGNOSIS — D0590 Unspecified type of carcinoma in situ of unspecified breast: Secondary | ICD-10-CM

## 2013-06-26 ENCOUNTER — Other Ambulatory Visit: Payer: Self-pay | Admitting: Internal Medicine

## 2013-06-29 ENCOUNTER — Telehealth: Payer: Self-pay | Admitting: *Deleted

## 2013-06-29 ENCOUNTER — Ambulatory Visit: Payer: Medicare HMO | Admitting: Oncology

## 2013-06-29 ENCOUNTER — Other Ambulatory Visit: Payer: Medicare HMO | Admitting: Lab

## 2013-06-29 NOTE — Telephone Encounter (Signed)
Called pt to inform her that Dr. Welton Flakes is not in the office on 8/21 and we need to reschedule her for the following week.  She is fine with that and I confirmed 07/12/13 appt w/ Lillia Abed and Dr. Welton Flakes w/ pt.  Mailed pt new calendar.

## 2013-06-30 ENCOUNTER — Other Ambulatory Visit: Payer: Medicare HMO | Admitting: Lab

## 2013-06-30 ENCOUNTER — Ambulatory Visit: Payer: Medicare HMO | Admitting: Oncology

## 2013-07-08 ENCOUNTER — Encounter (HOSPITAL_COMMUNITY): Payer: Self-pay | Admitting: Pharmacy Technician

## 2013-07-08 ENCOUNTER — Other Ambulatory Visit: Payer: Medicare HMO | Admitting: Lab

## 2013-07-08 ENCOUNTER — Encounter (HOSPITAL_COMMUNITY)
Admission: RE | Admit: 2013-07-08 | Discharge: 2013-07-08 | Disposition: A | Discharge status: Home / Self Care | Payer: Medicare HMO | Source: Ambulatory Visit | Attending: Obstetrics and Gynecology | Admitting: Obstetrics and Gynecology

## 2013-07-08 ENCOUNTER — Ambulatory Visit: Payer: Medicare HMO | Admitting: Adult Health

## 2013-07-08 ENCOUNTER — Encounter (HOSPITAL_COMMUNITY): Payer: Self-pay

## 2013-07-08 DIAGNOSIS — Z01818 Encounter for other preprocedural examination: Secondary | ICD-10-CM | POA: Insufficient documentation

## 2013-07-08 DIAGNOSIS — Z01812 Encounter for preprocedural laboratory examination: Secondary | ICD-10-CM | POA: Insufficient documentation

## 2013-07-08 LAB — BASIC METABOLIC PANEL
GFR calc non Af Amer: 89 mL/min — ABNORMAL LOW (ref >90)
Glucose, Bld: 93 mg/dL (ref 70–99)
Potassium: 4.2 mEq/L (ref 3.5–5.1)
Sodium: 137 mEq/L (ref 135–145)

## 2013-07-08 LAB — CBC
Hemoglobin: 13.1 g/dL (ref 12.0–15.0)
MCHC: 33.6 g/dL (ref 30.0–36.0)
Platelets: 270 10*3/uL (ref 150–400)
RBC: 4.35 MIL/uL (ref 3.87–5.11)

## 2013-07-08 NOTE — Patient Instructions (Addendum)
20 Martha Mora  07/08/2013   Your procedure is scheduled on:  07/14/13  Enter through the Main Entrance of [facility] at [facility]   Pick up the phone at the desk and dial [phone].   Call this number if you have problems the morning of surgery: [phone]   Remember:   Do not eat food:After Midnight.  Do not drink clear liquids: 4 Hours before arrival.  Take these medicines the morning of surgery with A SIP OF WATER: Prilosec, Tamoxifen, Celexa   Do not wear jewelry, make-up or nail polish.  Do not wear lotions, powders, or perfumes. You may wear deodorant.  Do not shave 48 hours prior to surgery.  Do not bring valuables to the hospital.  [facility] is not responsible                  for any belongings or valuables brought to the hospital.  Contacts, dentures or bridgework may not be worn into surgery.  Leave suitcase in the car. After surgery it may be brought to your room.  For patients admitted to the hospital, checkout time is 11:00 AM the day of                discharge.   Patients discharged the day of surgery will not be allowed to drive                   home.  Name and phone number of your driver: Husband   Martha Mora  Special Instructions: Shower using CHG 2 nights before surgery and the night before surgery.  If you shower the day of surgery use CHG.  Use special wash - you have one bottle of CHG for all showers.  You should use approximately 1/3 of the bottle for each shower.   Please read over the following fact sheets that you were given: Surgical Site Infection Prevention

## 2013-07-08 NOTE — Pre-Procedure Instructions (Signed)
Cardiac history and recent Echo of 07/07/13 reviewed by Amador Cunas. No orders given.

## 2013-07-12 ENCOUNTER — Ambulatory Visit: Payer: Medicare HMO | Admitting: Adult Health

## 2013-07-13 ENCOUNTER — Telehealth: Payer: Self-pay | Admitting: Oncology

## 2013-07-13 NOTE — Telephone Encounter (Signed)
, °

## 2013-07-14 ENCOUNTER — Ambulatory Visit (HOSPITAL_COMMUNITY): Payer: Medicare HMO | Admitting: Anesthesiology

## 2013-07-14 ENCOUNTER — Encounter (HOSPITAL_COMMUNITY)
Admission: RE | Disposition: A | Discharge status: Home / Self Care | Payer: Self-pay | Source: Ambulatory Visit | Attending: Obstetrics and Gynecology

## 2013-07-14 ENCOUNTER — Encounter (HOSPITAL_COMMUNITY): Payer: Self-pay | Admitting: Anesthesiology

## 2013-07-14 ENCOUNTER — Ambulatory Visit (HOSPITAL_COMMUNITY)
Admission: RE | Admit: 2013-07-14 | Discharge: 2013-07-14 | Disposition: A | Discharge status: Home / Self Care | Payer: Medicare HMO | Source: Ambulatory Visit | Attending: Obstetrics and Gynecology | Admitting: Obstetrics and Gynecology

## 2013-07-14 ENCOUNTER — Other Ambulatory Visit: Payer: Self-pay | Admitting: Obstetrics and Gynecology

## 2013-07-14 DIAGNOSIS — N950 Postmenopausal bleeding: Secondary | ICD-10-CM | POA: Diagnosis present

## 2013-07-14 DIAGNOSIS — R9389 Abnormal findings on diagnostic imaging of other specified body structures: Secondary | ICD-10-CM | POA: Insufficient documentation

## 2013-07-14 DIAGNOSIS — Z853 Personal history of malignant neoplasm of breast: Secondary | ICD-10-CM | POA: Insufficient documentation

## 2013-07-14 HISTORY — PX: HYSTEROSCOPY WITH D & C: SHX1775

## 2013-07-14 SURGERY — DILATATION AND CURETTAGE /HYSTEROSCOPY
Anesthesia: General | Site: Vagina | Wound class: Clean Contaminated

## 2013-07-14 MED ORDER — ONDANSETRON HCL 4 MG/2ML IJ SOLN
INTRAMUSCULAR | Status: AC
  Filled 2013-07-14: qty 2

## 2013-07-14 MED ORDER — EPHEDRINE SULFATE 50 MG/ML IJ SOLN
INTRAMUSCULAR | Status: DC | PRN
  Administered 2013-07-14 (×2): 10 mg via INTRAVENOUS

## 2013-07-14 MED ORDER — FENTANYL CITRATE 0.05 MG/ML IJ SOLN
INTRAMUSCULAR | Status: DC | PRN
  Administered 2013-07-14: 50 ug via INTRAVENOUS
  Administered 2013-07-14: 100 ug via INTRAVENOUS
  Administered 2013-07-14: 50 ug via INTRAVENOUS

## 2013-07-14 MED ORDER — LIDOCAINE HCL (CARDIAC) 20 MG/ML IV SOLN
INTRAVENOUS | Status: DC | PRN
  Administered 2013-07-14: 50 mg via INTRAVENOUS

## 2013-07-14 MED ORDER — DEXAMETHASONE SODIUM PHOSPHATE 4 MG/ML IJ SOLN
INTRAMUSCULAR | Status: DC | PRN
  Administered 2013-07-14: 10 mg via INTRAVENOUS

## 2013-07-14 MED ORDER — PROPOFOL 10 MG/ML IV EMUL
INTRAVENOUS | Status: AC
  Filled 2013-07-14: qty 20

## 2013-07-14 MED ORDER — IBUPROFEN 600 MG PO TABS: 600.0000 mg | ORAL_TABLET | Freq: Four times a day (QID) | ORAL | Status: DC | PRN

## 2013-07-14 MED ORDER — FENTANYL CITRATE 0.05 MG/ML IJ SOLN: 25.0000 ug | INTRAMUSCULAR | Status: DC | PRN

## 2013-07-14 MED ORDER — BUPIVACAINE HCL (PF) 0.25 % IJ SOLN
INTRAMUSCULAR | Status: AC
  Filled 2013-07-14: qty 30

## 2013-07-14 MED ORDER — ONDANSETRON HCL 4 MG/2ML IJ SOLN
INTRAMUSCULAR | Status: DC | PRN
  Administered 2013-07-14: 4 mg via INTRAVENOUS

## 2013-07-14 MED ORDER — MEPERIDINE HCL 25 MG/ML IJ SOLN: 6.2500 mg | INTRAMUSCULAR | Status: DC | PRN

## 2013-07-14 MED ORDER — LACTATED RINGERS IV SOLN
INTRAVENOUS | Status: DC
  Administered 2013-07-14 (×3): via INTRAVENOUS

## 2013-07-14 MED ORDER — METOCLOPRAMIDE HCL 5 MG/ML IJ SOLN: 10.0000 mg | Freq: Once | INTRAMUSCULAR | Status: DC | PRN

## 2013-07-14 MED ORDER — MIDAZOLAM HCL 2 MG/2ML IJ SOLN
INTRAMUSCULAR | Status: AC
  Filled 2013-07-14: qty 2

## 2013-07-14 MED ORDER — GLYCINE 1.5 % IR SOLN
Status: DC | PRN
  Administered 2013-07-14: 3000 mL

## 2013-07-14 MED ORDER — MIDAZOLAM HCL 5 MG/5ML IJ SOLN
INTRAMUSCULAR | Status: DC | PRN
  Administered 2013-07-14: 2 mg via INTRAVENOUS

## 2013-07-14 MED ORDER — FENTANYL CITRATE 0.05 MG/ML IJ SOLN
INTRAMUSCULAR | Status: AC
  Filled 2013-07-14: qty 2

## 2013-07-14 MED ORDER — DEXAMETHASONE SODIUM PHOSPHATE 10 MG/ML IJ SOLN
INTRAMUSCULAR | Status: AC
  Filled 2013-07-14: qty 1

## 2013-07-14 MED ORDER — BUPIVACAINE HCL (PF) 0.25 % IJ SOLN
INTRAMUSCULAR | Status: DC | PRN
  Administered 2013-07-14: 20 mL

## 2013-07-14 MED ORDER — HYDROCODONE-ACETAMINOPHEN 5-325 MG PO TABS: ORAL_TABLET | ORAL | Status: DC

## 2013-07-14 MED ORDER — LIDOCAINE HCL (CARDIAC) 20 MG/ML IV SOLN
INTRAVENOUS | Status: AC
  Filled 2013-07-14: qty 5

## 2013-07-14 MED ORDER — EPHEDRINE 5 MG/ML INJ
INTRAVENOUS | Status: AC
  Filled 2013-07-14: qty 10

## 2013-07-14 MED ORDER — PROPOFOL 10 MG/ML IV BOLUS
INTRAVENOUS | Status: DC | PRN
  Administered 2013-07-14: 150 mg via INTRAVENOUS

## 2013-07-14 SURGICAL SUPPLY — 17 items
CANISTER SUCTION 2500CC (MISCELLANEOUS) ×2 IMPLANT
CATH ROBINSON RED A/P 16FR (CATHETERS) ×2 IMPLANT
CLOTH BEACON ORANGE TIMEOUT ST (SAFETY) ×2 IMPLANT
CONTAINER PREFILL 10% NBF 60ML (FORM) ×2 IMPLANT
DRESSING TELFA 8X3 (GAUZE/BANDAGES/DRESSINGS) ×2 IMPLANT
ELECT REM PT RETURN 9FT ADLT (ELECTROSURGICAL) ×2
ELECTRODE REM PT RTRN 9FT ADLT (ELECTROSURGICAL) ×1 IMPLANT
GLOVE BIOGEL M 6.5 STRL (GLOVE) ×4 IMPLANT
GLOVE BIOGEL PI IND STRL 6.5 (GLOVE) ×1 IMPLANT
GLOVE BIOGEL PI INDICATOR 6.5 (GLOVE) ×1
GOWN PREVENTION PLUS XLARGE (GOWN DISPOSABLE) ×2 IMPLANT
GOWN STRL REIN XL XLG (GOWN DISPOSABLE) ×4 IMPLANT
LOOP ANGLED CUTTING 22FR (CUTTING LOOP) IMPLANT
PACK HYSTEROSCOPY LF (CUSTOM PROCEDURE TRAY) ×2 IMPLANT
PAD OB MATERNITY 4.3X12.25 (PERSONAL CARE ITEMS) ×2 IMPLANT
TOWEL OR 17X24 6PK STRL BLUE (TOWEL DISPOSABLE) ×4 IMPLANT
WATER STERILE IRR 1000ML POUR (IV SOLUTION) ×2 IMPLANT

## 2013-07-14 NOTE — Transfer of Care (Signed)
Immediate Anesthesia Transfer of Care Note  Patient: Martha Mora  Procedure(s) Performed: Procedure(s): DILATATION AND CURETTAGE /HYSTEROSCOPY (N/A)  Patient Location: PACU  Anesthesia Type:General  Level of Consciousness: awake  Airway & Oxygen Therapy: Patient Spontanous Breathing and Patient connected to nasal cannula oxygen  Post-op Assessment: Report given to PACU RN and Post -op Vital signs reviewed and stable  Post vital signs: stable  Complications: No apparent anesthesia complications

## 2013-07-14 NOTE — Op Note (Signed)
07/14/2013  7:38 PM  PATIENT:  Martha Mora  67 y.o. female  PRE-OPERATIVE DIAGNOSIS:  Post Mennopausal Bleeding CPT [phone]  POST-OPERATIVE DIAGNOSIS:  Post Mennopausal Bleeding  PROCEDURE:  Procedure(s): DILATATION AND CURETTAGE /HYSTEROSCOPY (N/A)  SURGEON:  Surgeon(s) and Role:    * Zadiel Leyh J. Richardson Dopp, MD - Primary  PHYSICIAN ASSISTANT:   ASSISTANTS: none   ANESTHESIA:   general  EBL: 10 cc   Total I/O In: 1500 [I.V.:1500] Out: 10 [Blood:10]  BLOOD ADMINISTERED:none  DRAINS: none   LOCAL MEDICATIONS USED:  MARCAINE     SPECIMEN:  Source of Specimen:  uterine currettings   DISPOSITION OF SPECIMEN:  PATHOLOGY  COUNTS:  YES  TOURNIQUET:  * No tourniquets in log *  DICTATION: .Dragon Dictation  PLAN OF CARE: Discharge to home after PACU  PATIENT DISPOSITION:  PACU - hemodynamically stable.   Delay start of Pharmacological VTE agent (>24hrs) due to surgical blood loss or risk of bleeding: not applicable  Findings... Minimal distention of endometrial cavity.. The endometrium did not appear proliferative and was more atrophic appearance however appeared distorted.    Indication: 67 y/o with h/o breast cancer and tamoxifen therapy. With postmenopausal bleeding and thickened endometrium on u/s.   Procedure: Patient was taken to the operating room where she was placed under general anesthesia. She was placed in the dorsal lithotomy position. She was prepped and draped in the usual sterile fashion. A speculum was placed into the vaginal vault. The anterior lip of the cervix was grasped with a single-tooth tenaculum. Quarter percent Marcaine was injected at the 4 and 8:00 positions of the cervix. The cervix was then sounded to 10 cm. The cervix was dilated to approximately 6 mm. Diagnostic hysteroscope was inserted. The findings noted above. The hysteroscope was removed. Sharp curet was introduced and endometrial corrected curettings were obtained. The hysteroscope was then  reinserted.  There was no evidence of perforation. Hysteroscope was then removed. The single-tooth tenaculum was removed from the anterior lip of the cervix.  Excellent hemostasis was noted. The speculum was removed from the patient's vagina. She was awakened from anesthesia taken care at country room awake and in stable condition. Sponge lap and needle counts were correct x2.

## 2013-07-14 NOTE — H&P (Signed)
Date of Initial H&P: 06/2013  History reviewed, patient examined, no change in status, stable for surgery. 

## 2013-07-14 NOTE — Anesthesia Preprocedure Evaluation (Signed)
Anesthesia Evaluation  Patient identified by MRN, date of birth, ID band Patient awake    Reviewed: Allergy & Precautions, H&P , NPO status , Patient's Chart, lab work & pertinent test results  Airway Mallampati: III TM Distance: >3 FB Neck ROM: Full    Dental  (+) Upper Dentures   Pulmonary sleep apnea , COPD breath sounds clear to auscultation  Pulmonary exam normal       Cardiovascular hypertension, Pt. on medications + angina + CAD and + Past MI + Valvular Problems/Murmurs Rhythm:Regular Rate:Normal     Neuro/Psych PSYCHIATRIC DISORDERS Anxiety Depression    GI/Hepatic Neg liver ROS, PUD, GERD-  Medicated and Controlled,  Endo/Other    Renal/GU negative Renal ROS  negative genitourinary   Musculoskeletal  (+) Arthritis -, Osteoarthritis,    Abdominal   Peds  Hematology negative hematology ROS (+)   Anesthesia Other Findings   Reproductive/Obstetrics Post Menopausal Bleeding Thickened Endometrium                           Anesthesia Physical Anesthesia Plan  ASA: III  Anesthesia Plan: General   Post-op Pain Management:    Induction: Intravenous  Airway Management Planned: LMA  Additional Equipment:   Intra-op Plan:   Post-operative Plan: Extubation in OR  Informed Consent: I have reviewed the patients History and Physical, chart, labs and discussed the procedure including the risks, benefits and alternatives for the proposed anesthesia with the patient or authorized representative who has indicated his/her understanding and acceptance.   Dental advisory given  Plan Discussed with: CRNA, Anesthesiologist and Surgeon  Anesthesia Plan Comments:         Anesthesia Quick Evaluation

## 2013-07-14 NOTE — Anesthesia Postprocedure Evaluation (Signed)
  Anesthesia Post-op Note  Patient: Martha Mora  Procedure(s) Performed: Procedure(s) (LRB): DILATATION AND CURETTAGE /HYSTEROSCOPY (N/A)  Patient Location: PACU  Anesthesia Type: General  Level of Consciousness: awake and alert   Airway and Oxygen Therapy: Patient Spontanous Breathing  Post-op Pain: mild  Post-op Assessment: Post-op Vital signs reviewed, Patient's Cardiovascular Status Stable, Respiratory Function Stable, Patent Airway and No signs of Nausea or vomiting  Last Vitals:  Filed Vitals:   07/14/13 1933  BP: 122/53  Pulse: 105  Temp: 37 C  Resp: 22    Post-op Vital Signs: stable   Complications: No apparent anesthesia complications

## 2013-07-15 ENCOUNTER — Encounter (HOSPITAL_COMMUNITY): Payer: Self-pay | Admitting: Obstetrics and Gynecology

## 2013-07-21 ENCOUNTER — Encounter: Payer: Self-pay | Admitting: Internal Medicine

## 2013-07-22 ENCOUNTER — Telehealth: Payer: Self-pay | Admitting: Adult Health

## 2013-07-22 ENCOUNTER — Ambulatory Visit (HOSPITAL_BASED_OUTPATIENT_CLINIC_OR_DEPARTMENT_OTHER): Payer: Medicare HMO | Admitting: Adult Health

## 2013-07-22 VITALS — BP 135/65 | HR 74 | Temp 99.1°F | Resp 20 | Ht 62.0 in | Wt 178.2 lb

## 2013-07-22 DIAGNOSIS — C50911 Malignant neoplasm of unspecified site of right female breast: Secondary | ICD-10-CM

## 2013-07-22 DIAGNOSIS — C50919 Malignant neoplasm of unspecified site of unspecified female breast: Secondary | ICD-10-CM

## 2013-07-22 DIAGNOSIS — N898 Other specified noninflammatory disorders of vagina: Secondary | ICD-10-CM

## 2013-07-22 NOTE — Patient Instructions (Addendum)
Doing well.  No sign of recurrence.  Stop the Tamoxifen.  We will f/u with you in 3 months.  Please call us if you have any questions or concerns.

## 2013-07-24 ENCOUNTER — Encounter: Payer: Self-pay | Admitting: Adult Health

## 2013-07-24 DIAGNOSIS — C50511 Malignant neoplasm of lower-outer quadrant of right female breast: Secondary | ICD-10-CM | POA: Insufficient documentation
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
## 2013-09-02 DIAGNOSIS — N938 Other specified abnormal uterine and vaginal bleeding: Secondary | ICD-10-CM | POA: Insufficient documentation

## 2013-09-20 ENCOUNTER — Other Ambulatory Visit: Payer: Self-pay | Admitting: Emergency Medicine

## 2013-09-20 DIAGNOSIS — C50911 Malignant neoplasm of unspecified site of right female breast: Secondary | ICD-10-CM

## 2013-09-21 ENCOUNTER — Encounter: Payer: Self-pay | Admitting: Adult Health

## 2013-09-21 ENCOUNTER — Telehealth: Payer: Self-pay | Admitting: *Deleted

## 2013-09-21 ENCOUNTER — Other Ambulatory Visit (HOSPITAL_BASED_OUTPATIENT_CLINIC_OR_DEPARTMENT_OTHER): Payer: Commercial Managed Care - HMO | Admitting: Lab

## 2013-09-21 ENCOUNTER — Ambulatory Visit (HOSPITAL_BASED_OUTPATIENT_CLINIC_OR_DEPARTMENT_OTHER): Payer: Medicare HMO | Admitting: Adult Health

## 2013-09-21 VITALS — BP 147/71 | HR 71 | Temp 98.4°F | Resp 18 | Ht 62.0 in | Wt 187.5 lb

## 2013-09-21 DIAGNOSIS — E2839 Other primary ovarian failure: Secondary | ICD-10-CM

## 2013-09-21 DIAGNOSIS — C50919 Malignant neoplasm of unspecified site of unspecified female breast: Secondary | ICD-10-CM

## 2013-09-21 DIAGNOSIS — N898 Other specified noninflammatory disorders of vagina: Secondary | ICD-10-CM

## 2013-09-21 DIAGNOSIS — M899 Disorder of bone, unspecified: Secondary | ICD-10-CM

## 2013-09-21 DIAGNOSIS — C50911 Malignant neoplasm of unspecified site of right female breast: Secondary | ICD-10-CM

## 2013-09-21 DIAGNOSIS — Z170 Estrogen receptor positive status [ER+]: Secondary | ICD-10-CM

## 2013-09-21 DIAGNOSIS — M898X9 Other specified disorders of bone, unspecified site: Secondary | ICD-10-CM

## 2013-09-21 LAB — CBC WITH DIFFERENTIAL/PLATELET
Basophils Absolute: 0.1 10*3/uL (ref 0.0–0.1)
Eosinophils Absolute: 0.3 10*3/uL (ref 0.0–0.5)
HGB: 13.4 g/dL (ref 11.6–15.9)
MCV: 91.9 fL (ref 79.5–101.0)
MONO#: 0.7 10*3/uL (ref 0.1–0.9)
MONO%: 7.1 % (ref 0.0–14.0)
NEUT#: 7.1 10*3/uL — ABNORMAL HIGH (ref 1.5–6.5)
RBC: 4.45 10*6/uL (ref 3.70–5.45)
RDW: 14.2 % (ref 11.2–14.5)
WBC: 10.4 10*3/uL — ABNORMAL HIGH (ref 3.9–10.3)

## 2013-09-21 LAB — COMPREHENSIVE METABOLIC PANEL (CC13)
Albumin: 3 g/dL — ABNORMAL LOW (ref 3.5–5.0)
Alkaline Phosphatase: 68 U/L (ref 40–150)
Anion Gap: 11 mEq/L (ref 3–11)
BUN: 12.5 mg/dL (ref 7.0–26.0)
CO2: 19 mEq/L — ABNORMAL LOW (ref 22–29)
Calcium: 9.2 mg/dL (ref 8.4–10.4)
Chloride: 107 mEq/L (ref 98–109)
Glucose: 146 mg/dl — ABNORMAL HIGH (ref 70–140)
Potassium: 3.6 mEq/L (ref 3.5–5.1)
Sodium: 137 mEq/L (ref 136–145)
Total Protein: 6.1 g/dL — ABNORMAL LOW (ref 6.4–8.3)

## 2013-09-21 NOTE — Telephone Encounter (Signed)
appts made and printed. Pt is aware that cs will call for her whole body bone scan...td
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
## 2013-09-24 ENCOUNTER — Telehealth: Payer: Self-pay | Admitting: *Deleted

## 2013-09-24 NOTE — Telephone Encounter (Signed)
INFORM PRE OP NURSE THAT PT.'S ZOMETA DOSE IS 4MG .

## 2013-10-04 ENCOUNTER — Other Ambulatory Visit: Payer: Medicare HMO

## 2013-10-12 ENCOUNTER — Encounter (HOSPITAL_COMMUNITY)
Admission: RE | Admit: 2013-10-12 | Discharge: 2013-10-12 | Disposition: A | Discharge status: Home / Self Care | Payer: Medicare HMO | Source: Ambulatory Visit | Attending: Adult Health | Admitting: Adult Health

## 2013-10-12 DIAGNOSIS — M4180 Other forms of scoliosis, site unspecified: Secondary | ICD-10-CM | POA: Insufficient documentation

## 2013-10-12 DIAGNOSIS — C50919 Malignant neoplasm of unspecified site of unspecified female breast: Secondary | ICD-10-CM | POA: Insufficient documentation

## 2013-10-12 DIAGNOSIS — M899 Disorder of bone, unspecified: Secondary | ICD-10-CM | POA: Insufficient documentation

## 2013-10-12 DIAGNOSIS — C50911 Malignant neoplasm of unspecified site of right female breast: Secondary | ICD-10-CM

## 2013-10-12 DIAGNOSIS — M898X9 Other specified disorders of bone, unspecified site: Secondary | ICD-10-CM

## 2013-10-12 MED ORDER — TECHNETIUM TC 99M MEDRONATE IV KIT
24.8000 | PACK | Freq: Once | INTRAVENOUS | Status: AC | PRN
  Administered 2013-10-12: 24.8 via INTRAVENOUS

## 2013-10-13 ENCOUNTER — Telehealth: Payer: Self-pay | Admitting: *Deleted

## 2013-10-13 NOTE — Telephone Encounter (Signed)
Per NP, notified pt  Bone scan normal, no cancer. Pt verbalized understanding. No concerns at this time.

## 2013-10-22 ENCOUNTER — Other Ambulatory Visit: Payer: Medicare HMO

## 2013-10-27 ENCOUNTER — Ambulatory Visit
Admission: RE | Admit: 2013-10-27 | Discharge: 2013-10-27 | Disposition: A | Discharge status: Home / Self Care | Payer: Commercial Managed Care - HMO | Source: Ambulatory Visit | Attending: Adult Health | Admitting: Adult Health

## 2013-10-27 DIAGNOSIS — E2839 Other primary ovarian failure: Secondary | ICD-10-CM

## 2013-10-27 DIAGNOSIS — C50911 Malignant neoplasm of unspecified site of right female breast: Secondary | ICD-10-CM

## 2013-11-02 ENCOUNTER — Telehealth: Payer: Self-pay | Admitting: Oncology

## 2013-11-02 ENCOUNTER — Ambulatory Visit (HOSPITAL_BASED_OUTPATIENT_CLINIC_OR_DEPARTMENT_OTHER): Payer: Medicare HMO | Admitting: Adult Health

## 2013-11-02 ENCOUNTER — Encounter: Payer: Self-pay | Admitting: Adult Health

## 2013-11-02 VITALS — BP 142/71 | HR 85 | Temp 98.2°F | Resp 20 | Ht 62.0 in | Wt 186.5 lb

## 2013-11-02 DIAGNOSIS — C50919 Malignant neoplasm of unspecified site of unspecified female breast: Secondary | ICD-10-CM

## 2013-11-02 DIAGNOSIS — C50911 Malignant neoplasm of unspecified site of right female breast: Secondary | ICD-10-CM
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
## 2013-11-02 NOTE — Telephone Encounter (Signed)
gv adn printed appt sched and avs for pt for June 2015  °

## 2013-12-02 ENCOUNTER — Other Ambulatory Visit: Payer: Self-pay | Admitting: *Deleted

## 2013-12-02 DIAGNOSIS — D0590 Unspecified type of carcinoma in situ of unspecified breast: Secondary | ICD-10-CM

## 2013-12-02 MED ORDER — TAMOXIFEN CITRATE 20 MG PO TABS: ORAL_TABLET | ORAL | Status: DC

## 2014-02-07 ENCOUNTER — Other Ambulatory Visit: Payer: Self-pay | Admitting: Internal Medicine

## 2014-02-17 ENCOUNTER — Other Ambulatory Visit: Payer: Self-pay | Admitting: Internal Medicine

## 2014-04-04 ENCOUNTER — Other Ambulatory Visit: Payer: Self-pay | Admitting: Obstetrics and Gynecology

## 2014-04-04 DIAGNOSIS — Z853 Personal history of malignant neoplasm of breast: Secondary | ICD-10-CM

## 2014-05-04 ENCOUNTER — Encounter (INDEPENDENT_AMBULATORY_CARE_PROVIDER_SITE_OTHER): Payer: Self-pay

## 2014-05-04 ENCOUNTER — Ambulatory Visit
Admission: RE | Admit: 2014-05-04 | Discharge: 2014-05-04 | Disposition: A | Discharge status: Home / Self Care | Payer: Commercial Managed Care - HMO | Source: Ambulatory Visit | Attending: Obstetrics and Gynecology | Admitting: Obstetrics and Gynecology

## 2014-05-04 DIAGNOSIS — Z853 Personal history of malignant neoplasm of breast: Secondary | ICD-10-CM

## 2014-05-10 ENCOUNTER — Ambulatory Visit: Payer: Medicare HMO | Admitting: Adult Health

## 2014-05-10 ENCOUNTER — Other Ambulatory Visit: Payer: Medicare HMO

## 2014-05-23 ENCOUNTER — Ambulatory Visit (INDEPENDENT_AMBULATORY_CARE_PROVIDER_SITE_OTHER): Payer: Commercial Managed Care - HMO | Admitting: Family Medicine

## 2014-05-23 ENCOUNTER — Encounter: Payer: Self-pay | Admitting: Family Medicine

## 2014-05-23 VITALS — BP 147/76 | HR 80 | Temp 98.2°F | Ht 62.0 in | Wt 194.0 lb

## 2014-05-23 DIAGNOSIS — I25119 Atherosclerotic heart disease of native coronary artery with unspecified angina pectoris: Secondary | ICD-10-CM

## 2014-05-23 DIAGNOSIS — I209 Angina pectoris, unspecified: Secondary | ICD-10-CM

## 2014-05-23 DIAGNOSIS — I059 Rheumatic mitral valve disease, unspecified: Secondary | ICD-10-CM

## 2014-05-23 DIAGNOSIS — Z87891 Personal history of nicotine dependence: Secondary | ICD-10-CM | POA: Insufficient documentation

## 2014-05-23 DIAGNOSIS — I2510 Atherosclerotic heart disease of native coronary artery without angina pectoris: Secondary | ICD-10-CM

## 2014-05-23 DIAGNOSIS — I340 Nonrheumatic mitral (valve) insufficiency: Secondary | ICD-10-CM

## 2014-05-23 DIAGNOSIS — Z720 Tobacco use: Secondary | ICD-10-CM

## 2014-05-23 DIAGNOSIS — F17200 Nicotine dependence, unspecified, uncomplicated: Secondary | ICD-10-CM

## 2014-05-23 DIAGNOSIS — Z0000 Encounter for general adult medical examination without abnormal findings: Secondary | ICD-10-CM

## 2014-05-23 DIAGNOSIS — Z23 Encounter for immunization: Secondary | ICD-10-CM

## 2014-05-23 MED ORDER — ZOSTER VACCINE LIVE 19400 UNT/0.65ML ~~LOC~~ SOLR: 0.6500 mL | Freq: Once | SUBCUTANEOUS | Status: DC

## 2014-05-23 NOTE — Addendum Note (Signed)
Addended by: Corinna Capra on: 05/23/2014 11:14 AM   Modules accepted: Orders

## 2014-05-23 NOTE — Patient Instructions (Signed)
Martha Mora, it was nice meeting you today.  Please call Dr. Merrilee Jansky office to schedule an appointment with him in the next 2-3 weeks.  At this appointment, please ask him about sending over his most recent records, including your last echocardiogram.  As well, please discuss with him about being on a beta blocker and possibly switching from Pravachol to Crestor.    Thanks, Dr. Awanda Mink

## 2014-05-23 NOTE — Assessment & Plan Note (Signed)
Up to date with screenings.  Will give Pneumovax, Tdap today and Rx for Zostavax.

## 2014-05-23 NOTE — Assessment & Plan Note (Signed)
Pt with severe CAD with multiple stent placement.  Continues on ASA, Losartan, and unsure if on Beta Blocker.  As well with her cardiac history, recommend higher intensity statin (previous myalgias from lipitor) in possible crestor.  As well, will have her ask Dr. [name] about beta blocker usage and she had been on Brilinta BID and he was ok with stopping this.  Will have her ask about sending recent records over including Echo report.

## 2014-05-23 NOTE — Assessment & Plan Note (Signed)
Information given for Dr. Graylin Shiver clinic to help with cessation.  She states she is ready to quit, at about 7-8 on scale of 10 willingness.

## 2014-05-23 NOTE — Progress Notes (Signed)
Martha Mora is a 67 y.o. who presents today for establishing care.  She has a extensive PMHx for multiple conditions.  These and her medications along with her health maintenance were updated today.    Past Medical History  Diagnosis Date  . Reflux esophagitis   . Duodenitis without hemorrhage   . Internal hemorrhoids   . Diverticulosis   . GERD (gastroesophageal reflux disease)   . Adenomatous colon polyp   . Depression   . Obesity   . Hyperlipemia   . Hypertension   . CAD (coronary artery disease)   . Family history of malignant neoplasm of gastrointestinal tract   . Osteopenia 12/27/2011  . Heart murmur   . Anginal pain   . Myocardial infarction 1997  . COPD (chronic obstructive pulmonary disease)   . Chronic bronchitis   . OSA (obstructive sleep apnea)     "should wear machine; can't sleep w/it on" (09/30/12)  . Osteoarthritis     "back & hips mostly" (09/30/12)  . Anxiety   . Breast cancer     History   Social History  . Marital Status: Married    Spouse Name: N/A    Number of Children: 2  . Years of Education: N/A   Occupational History  . retired    Social History Main Topics  . Smoking status: Current Every Day Smoker -- 1.50 packs/day for 51 years    Types: Cigarettes  . Smokeless tobacco: Never Used  . Alcohol Use: No  . Drug Use: No  . Sexual Activity: Yes   Other Topics Concern  . Not on file   Social History Narrative  . No narrative on file    Family History  Problem Relation Age of Onset  . Colon cancer Paternal Grandmother   . Heart disease Maternal Grandfather   . Heart disease Maternal Grandmother   . Alcohol abuse Mother   . Depression Mother   . Hypertension Mother   . Stroke Mother   . Alcohol abuse Father     Current Outpatient Prescriptions on File Prior to Visit  Medication Sig Dispense Refill  . amLODipine (NORVASC) 5 MG tablet Take 5 mg by mouth at bedtime.       [name] aspirin 81 MG tablet Take 81 mg by mouth daily.       .  B Complex-C (B-COMPLEX WITH VITAMIN C) tablet Take 1 tablet by mouth daily.      . Calcium Carbonate-Vitamin D 600-400 MG-UNIT per tablet Take 1 tablet by mouth 2 (two) times daily.       . citalopram (CELEXA) 40 MG tablet Take 20 mg by mouth 2 (two) times daily.       . diclofenac (VOLTAREN) 50 MG EC tablet Take 50 mg by mouth daily.      . ferrous sulfate 325 (65 FE) MG tablet Take 325 mg by mouth 3 (three) times a week.       . Glucosamine-Chondroitin-MSM 375-300-250 MG TABS Take 1 tablet by mouth 2 (two) times daily.       Nyoka Cowden Tea, Camillia sinensis, (GREEN TEA PO) Take 8 oz by mouth 2 (two) times daily.      . Magnesium Oxide 500 MG CAPS Take 1 capsule by mouth daily.      . Multiple Vitamin (MULTIVITAMIN) tablet Take 1 tablet by mouth 3 (three) times daily. Herbal life multivitamin      . Omega-3 Fatty Acids (FISH OIL) 1000 MG CAPS Take 1 capsule  by mouth 3 (three) times daily.      [name] omeprazole (PRILOSEC) 40 MG capsule TAKE 1 CAPSULE (40 MG TOTAL) BY MOUTH DAILY.  30 capsule  6  . OVER THE COUNTER MEDICATION Take 1 tablet by mouth 3 (three) times daily. "Florafiber" with acidophilus      . OVER THE COUNTER MEDICATION Take 1 tablet by mouth daily. Ocular defense      . pravastatin (PRAVACHOL) 40 MG tablet Take 40 mg by mouth daily.        [name] PROAIR HFA 108 (90 BASE) MCG/ACT inhaler       . tamoxifen (NOLVADEX) 20 MG tablet TAKE 1 TABLET BY MOUTH EVERY DAY  30 tablet  11  . temazepam (RESTORIL) 15 MG capsule Take 30 mg by mouth at bedtime as needed. For sleep      . Ticagrelor (BRILINTA) 90 MG TABS tablet Take 1 tablet (90 mg total) by mouth 2 (two) times daily.  60 tablet  11  . traMADol (ULTRAM) 50 MG tablet       . vitamin C (ASCORBIC ACID) 500 MG tablet Take 500 mg by mouth every evening.      . zolendronic acid (ZOMETA) 4 MG/5ML injection Inject 4 mg into the vein every 6 (six) months.       No current facility-administered medications on file prior to visit.    Patient  Information Form: Screening and ROS  AUDIT-C Score: 0 Do you feel safe in relationships? Yes.   PHQ-2:positive (Hx of Depression on Celexa, no SI/HI)   Review of Symptoms  General:  Negative for nexplained weight loss, fever Skin: Negative for new or changing mole, sore that won't heal HEENT: Negative for trouble hearing, trouble seeing, ringing in ears, mouth sores, hoarseness, change in voice, dysphagia. CV:  Negative for chest pain, dyspnea, edema, palpitations Resp: Negative for cough, dyspnea, hemoptysis GI: Negative for nausea, vomiting, diarrhea, constipation, abdominal pain, melena, hematochezia. GU: Negative for dysuria, incontinence, urinary hesitance, hematuria, vaginal or penile discharge, polyuria, sexual difficulty, lumps in testicle or breasts MSK: Negative for muscle cramps or aches, joint pain or swelling Neuro: Negative for headaches, weakness, numbness, dizziness, passing out/fainting Psych: Negative for depression, anxiety, memory problems  Physical Exam Filed Vitals:   05/23/14 1032  BP: 147/76  Pulse: 80  Temp: 98.2 F (36.8 C)    Gen: NAD, Well nourished, Well developed HEENT: PERLA, EOMI, [place]/AT Neck: no JVD Cardio: RRR, +4/5 holosystolic murmur at apex  Lungs: CTA, no wheezes, rhonchi, crackles Abd: NABS, soft nontender nondistended MSK: ROM normal  Neuro: CN 2-12 intact, MS 5/5 B/L UE and LE, +2 patellar and achilles relfex b/l  Psych: AAO x 3     Chemistry      Component Value Date/Time   NA 137 09/21/2013 1106   NA 137 07/08/2013 1216   K 3.6 09/21/2013 1106   K 4.2 07/08/2013 1216   CL 105 07/08/2013 1216   CO2 19* 09/21/2013 1106   CO2 21 07/08/2013 1216   BUN 12.5 09/21/2013 1106   BUN 10 07/08/2013 1216   CREATININE 0.7 09/21/2013 1106   CREATININE 0.64 07/08/2013 1216      Component Value Date/Time   CALCIUM 9.2 09/21/2013 1106   CALCIUM 9.3 07/08/2013 1216   ALKPHOS 68 09/21/2013 1106   ALKPHOS 50 10/02/2012 1515   AST 14 09/21/2013 1106    AST 12 10/02/2012 1515   ALT 6 09/21/2013 1106   ALT 6 10/02/2012 1515  BILITOT 0.34 09/21/2013 1106   BILITOT 0.2* 10/02/2012 1515      Lab Results  Component Value Date   WBC 10.4* 09/21/2013   HGB 13.4 09/21/2013   HCT 40.9 09/21/2013   MCV 91.9 09/21/2013   PLT 319 09/21/2013   Lab Results  Component Value Date   TSH 1.00 07/19/2008   No results found for this basename: HGBA1C    >50% of the 45 minute visit was spent counseling, managing, and discussing pt's health conditions.

## 2014-06-09 ENCOUNTER — Encounter: Payer: Self-pay | Admitting: Family Medicine

## 2014-06-09 ENCOUNTER — Telehealth: Payer: Self-pay | Admitting: Family Medicine

## 2014-06-09 NOTE — Progress Notes (Signed)
Patient brought in her [employer] card and is needing her referral done now.

## 2014-06-09 NOTE — Telephone Encounter (Signed)
Pt called because she had to wait for her insurance card to a correct name on it. Well this has been done and her husband brought the card up here so that we could scan it in the system. jw

## 2014-06-10 NOTE — Telephone Encounter (Signed)
Spoke with pt, she has had card changed and brought new one in to be scanned in. Name has not been changed in system, advised it will most likely change the beginning of the month. Have placed referral on website with Dr. Nori Riis as referring provider. Will check back 1st of month (pt request dr [name]). Laban Emperor Fleeger, CMA

## 2014-06-29 ENCOUNTER — Ambulatory Visit: Payer: Commercial Managed Care - HMO | Admitting: Family Medicine

## 2014-07-26 ENCOUNTER — Telehealth: Payer: Self-pay | Admitting: Family Medicine

## 2014-07-26 DIAGNOSIS — M25539 Pain in unspecified wrist: Principal | ICD-10-CM

## 2014-07-26 DIAGNOSIS — G8929 Other chronic pain: Secondary | ICD-10-CM

## 2014-07-26 NOTE — Telephone Encounter (Signed)
Pt called and would like a referral to see Dr. Rhona Raider. Blima Rich

## 2014-07-28 NOTE — Telephone Encounter (Signed)
Please advise.Thank you.Graysin Luczynski S  

## 2014-07-28 NOTE — Telephone Encounter (Signed)
LVM in regards to her referral and what she would like this for.  Instructed for her to call back so we can find out what orthopaedic issues she is having.  As well, stated her insurance may require a visit in the clinic for this issue prior to referral being placed, depending on the issue.   Thanks, Tamela Oddi. Awanda Mink, DO of [facility] 07/28/2014, 11:40 AM

## 2014-07-29 NOTE — Telephone Encounter (Addendum)
Spoke with patient, she has been seeing Dr Latanya Maudlin for right wrist pain in which he has done wrist injections in the past. Her insurance now requires a referral and prior authorization for her to continue seeing him. Myrtha Tonkovich, Kevin Fenton   Referral in.   Thanks, Tamela Oddi. Awanda Mink, DO of [facility] 07/30/2014, 5:46 PM

## 2014-07-30 NOTE — Addendum Note (Signed)
Addended by: Nolon Rod on: 07/30/2014 05:46 PM   Modules accepted: Orders

## 2014-08-05 ENCOUNTER — Encounter: Payer: Self-pay | Admitting: Internal Medicine

## 2014-09-14 ENCOUNTER — Other Ambulatory Visit: Payer: Self-pay | Admitting: Family Medicine

## 2014-09-14 ENCOUNTER — Telehealth: Payer: Self-pay | Admitting: Family Medicine

## 2014-09-14 DIAGNOSIS — R400 Somnolence: Secondary | ICD-10-CM

## 2014-09-14 MED ORDER — TRAZODONE HCL 100 MG PO TABS: 100.0000 mg | ORAL_TABLET | Freq: Every evening | ORAL | Status: DC | PRN

## 2014-09-14 NOTE — Telephone Encounter (Signed)
PT states that her cardiologist would like her to get a sleep study done. He is unable to make the referral to sleep study due to her [place] Mel[place], the PCP must place referral. Also, he states he would also like Dr. Awanda Mink prescribed her sleep ( was on Temazepam but is willing to change). Pls advise.

## 2014-10-27 ENCOUNTER — Encounter (HOSPITAL_COMMUNITY): Payer: Self-pay | Admitting: Cardiovascular Disease

## 2014-10-31 ENCOUNTER — Telehealth: Payer: Self-pay | Admitting: Family Medicine

## 2014-10-31 NOTE — Telephone Encounter (Signed)
Needs referral to Dr. Melrose Nakayama, ortho doctor, for rt wrist, has been seen by him several times for same issue.

## 2014-10-31 NOTE — Telephone Encounter (Signed)
Referral in to see Dr. Latanya Maudlin placed in September of 2015.    Thanks [employer]. Awanda Mink, DO of [facility] 10/31/2014, 11:17 AM

## 2014-11-04 NOTE — Telephone Encounter (Signed)
Pt checking status of referral

## 2014-11-09 ENCOUNTER — Other Ambulatory Visit: Payer: Self-pay | Admitting: Family Medicine

## 2014-11-14 ENCOUNTER — Telehealth: Payer: Self-pay | Admitting: Family Medicine

## 2014-11-14 DIAGNOSIS — M25519 Pain in unspecified shoulder: Secondary | ICD-10-CM

## 2014-11-14 DIAGNOSIS — M545 Low back pain: Secondary | ICD-10-CM

## 2014-11-14 NOTE — Telephone Encounter (Signed)
Pt called and needs additional referrals for her Back and shoulder pain. jw

## 2014-11-21 NOTE — Telephone Encounter (Signed)
Referral in and completed.  Thanks [employer]. Awanda Mink, DO of [facility] 11/21/2014, 9:14 AM

## 2014-12-04 ENCOUNTER — Ambulatory Visit (HOSPITAL_BASED_OUTPATIENT_CLINIC_OR_DEPARTMENT_OTHER): Payer: Commercial Managed Care - HMO | Attending: Family Medicine | Admitting: Radiology

## 2014-12-04 DIAGNOSIS — R0683 Snoring: Secondary | ICD-10-CM | POA: Diagnosis not present

## 2014-12-04 DIAGNOSIS — G4710 Hypersomnia, unspecified: Secondary | ICD-10-CM | POA: Insufficient documentation

## 2014-12-04 DIAGNOSIS — G4733 Obstructive sleep apnea (adult) (pediatric): Secondary | ICD-10-CM | POA: Insufficient documentation

## 2014-12-04 DIAGNOSIS — R400 Somnolence: Secondary | ICD-10-CM

## 2014-12-10 DIAGNOSIS — G4710 Hypersomnia, unspecified: Secondary | ICD-10-CM

## 2014-12-10 DIAGNOSIS — R0683 Snoring: Secondary | ICD-10-CM

## 2014-12-10 DIAGNOSIS — G4733 Obstructive sleep apnea (adult) (pediatric): Secondary | ICD-10-CM
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
## 2014-12-19 ENCOUNTER — Telehealth: Payer: Self-pay | Admitting: Family Medicine

## 2014-12-19 NOTE — Telephone Encounter (Signed)
Would like results from sleep study and whats next?

## 2014-12-20 NOTE — Telephone Encounter (Signed)
Please let pt know that she has mild OSA.  I have signed a Rx for her to receive [facility]    Thanks, Gaspar Bidding

## 2014-12-21 ENCOUNTER — Telehealth: Payer: Self-pay | Admitting: Hematology

## 2014-12-21 NOTE — Telephone Encounter (Signed)
Patient confirmed appointment for Februay. Mailed calendar

## 2014-12-27 ENCOUNTER — Telehealth: Payer: Self-pay | Admitting: Family Medicine

## 2014-12-27 DIAGNOSIS — G4733 Obstructive sleep apnea (adult) (pediatric): Secondary | ICD-10-CM

## 2014-12-27 NOTE — Telephone Encounter (Signed)
rx requested for CPAP machine

## 2014-12-27 NOTE — Telephone Encounter (Signed)
Completed order, please print and fax to [place] @ [phone].  Thanks, Tamela Oddi. Awanda Mink, DO of [facility] 12/27/2014, 3:58 PM

## 2014-12-27 NOTE — Telephone Encounter (Signed)
Pt requested authorization to be faxed to  [facility]

## 2014-12-27 NOTE — Telephone Encounter (Signed)
Printed and faxed out

## 2015-01-04 ENCOUNTER — Telehealth: Payer: Self-pay | Admitting: Family Medicine

## 2015-01-04 DIAGNOSIS — R21 Rash and other nonspecific skin eruption: Secondary | ICD-10-CM

## 2015-01-04 NOTE — Telephone Encounter (Signed)
LVM for patient to call back to find out what exactly she needs this referral for

## 2015-01-04 NOTE — Telephone Encounter (Signed)
Spoke with patient and she is wanting the referral to dermatology for "bumps" that have appeared over there body, plus she also stated that she gets a "whole body check". Does the patient need to come in first to be evaluated?

## 2015-01-04 NOTE — Telephone Encounter (Signed)
Needs referral to dermatologist [facility] [employer] Please call when the referall appt has been made

## 2015-01-04 NOTE — Telephone Encounter (Signed)
Referral placed.  Tamela Oddi Awanda Mink, DO of [facility] 01/04/2015, 3:27 PM

## 2015-01-08 ENCOUNTER — Other Ambulatory Visit: Payer: Self-pay | Admitting: Family Medicine

## 2015-01-11 ENCOUNTER — Telehealth: Payer: Self-pay | Admitting: *Deleted

## 2015-01-11 DIAGNOSIS — I2510 Atherosclerotic heart disease of native coronary artery without angina pectoris: Secondary | ICD-10-CM

## 2015-01-11 NOTE — Telephone Encounter (Signed)
Magda Paganini from [place] called needing a referral placed.  Pt has an appt tomorrow 01/12/2015.  Please give her a call at [phone]. Derl Barrow, RN

## 2015-01-12 ENCOUNTER — Telehealth: Payer: Self-pay | Admitting: Family Medicine

## 2015-01-12 DIAGNOSIS — M1990 Unspecified osteoarthritis, unspecified site: Secondary | ICD-10-CM

## 2015-01-12 NOTE — Telephone Encounter (Signed)
Faxed referral to [facility]

## 2015-01-12 NOTE — Telephone Encounter (Signed)
Needs referral to [facility] [phone] phone [place]

## 2015-01-12 NOTE — Telephone Encounter (Signed)
Leslie from [place] would like [place] to fax the referral to them.  Can we please do that and the number is [phone].  Tamela Oddi Awanda Mink, DO of [facility] 01/12/2015, 8:44 AM

## 2015-01-12 NOTE — Telephone Encounter (Signed)
Referral placed.  Tamela Oddi Awanda Mink, DO of [facility] 01/12/2015, 8:42 AM

## 2015-01-13 NOTE — Telephone Encounter (Signed)
Order placed.  Tamela Oddi Awanda Mink, DO of [facility] 01/13/2015, 1:43 PM

## 2015-01-16 ENCOUNTER — Other Ambulatory Visit: Payer: Self-pay | Admitting: *Deleted

## 2015-01-16 DIAGNOSIS — C50911 Malignant neoplasm of unspecified site of right female breast: Secondary | ICD-10-CM

## 2015-01-17 ENCOUNTER — Other Ambulatory Visit (HOSPITAL_BASED_OUTPATIENT_CLINIC_OR_DEPARTMENT_OTHER): Payer: Commercial Managed Care - HMO

## 2015-01-17 ENCOUNTER — Encounter: Payer: Self-pay | Admitting: Hematology

## 2015-01-17 ENCOUNTER — Telehealth: Payer: Self-pay | Admitting: Hematology

## 2015-01-17 ENCOUNTER — Ambulatory Visit (HOSPITAL_BASED_OUTPATIENT_CLINIC_OR_DEPARTMENT_OTHER): Payer: Commercial Managed Care - HMO | Admitting: Hematology

## 2015-01-17 VITALS — BP 166/64 | HR 75 | Temp 98.5°F | Resp 18 | Ht 62.0 in | Wt 212.0 lb

## 2015-01-17 DIAGNOSIS — C50911 Malignant neoplasm of unspecified site of right female breast: Secondary | ICD-10-CM

## 2015-01-17 DIAGNOSIS — C773 Secondary and unspecified malignant neoplasm of axilla and upper limb lymph nodes: Secondary | ICD-10-CM

## 2015-01-17 DIAGNOSIS — C50811 Malignant neoplasm of overlapping sites of right female breast: Secondary | ICD-10-CM

## 2015-01-17 DIAGNOSIS — I10 Essential (primary) hypertension: Secondary | ICD-10-CM

## 2015-01-17 DIAGNOSIS — M8580 Other specified disorders of bone density and structure, unspecified site: Secondary | ICD-10-CM

## 2015-01-17 LAB — COMPREHENSIVE METABOLIC PANEL (CC13)
ALBUMIN: 3.1 g/dL — AB (ref 3.5–5.0)
ALT: 12 U/L (ref 0–55)
ANION GAP: 11 meq/L (ref 3–11)
AST: 19 U/L (ref 5–34)
Alkaline Phosphatase: 58 U/L (ref 40–150)
BILIRUBIN TOTAL: 0.23 mg/dL (ref 0.20–1.20)
BUN: 14.3 mg/dL (ref 7.0–26.0)
CO2: 23 mEq/L (ref 22–29)
Calcium: 9.1 mg/dL (ref 8.4–10.4)
Chloride: 108 mEq/L (ref 98–109)
Creatinine: 0.8 mg/dL (ref 0.6–1.1)
EGFR: 74 mL/min/{1.73_m2} — AB (ref >90)
Glucose: 94 mg/dl (ref 70–140)
Potassium: 4.4 mEq/L (ref 3.5–5.1)
SODIUM: 142 meq/L (ref 136–145)
Total Protein: 5.8 g/dL — ABNORMAL LOW (ref 6.4–8.3)

## 2015-01-17 LAB — CBC WITH DIFFERENTIAL/PLATELET
BASO%: 0.7 % (ref 0.0–2.0)
BASOS ABS: 0.1 10*3/uL (ref 0.0–0.1)
EOS%: 3.7 % (ref 0.0–7.0)
Eosinophils Absolute: 0.4 10*3/uL (ref 0.0–0.5)
HCT: 40.5 % (ref 34.8–46.6)
HEMOGLOBIN: 13.4 g/dL (ref 11.6–15.9)
LYMPH%: 27.2 % (ref 14.0–49.7)
MCH: 30.5 pg (ref 25.1–34.0)
MCHC: 33.1 g/dL (ref 31.5–36.0)
MCV: 92.3 fL (ref 79.5–101.0)
MONO#: 0.7 10*3/uL (ref 0.1–0.9)
MONO%: 7.1 % (ref 0.0–14.0)
NEUT#: 5.9 10*3/uL (ref 1.5–6.5)
NEUT%: 61.3 % (ref 38.4–76.8)
PLATELETS: 186 10*3/uL (ref 145–400)
RBC: 4.39 10*6/uL (ref 3.70–5.45)
RDW: 14.3 % (ref 11.2–14.5)
WBC: 9.6 10*3/uL (ref 3.9–10.3)
lymph#: 2.6 10*3/uL (ref 0.9–3.3)

## 2015-01-17 NOTE — Telephone Encounter (Signed)
Pt confirmed labs/ov/inj per 03/01 POF, gave pt AVS... KJ
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
## 2015-01-23 ENCOUNTER — Other Ambulatory Visit: Payer: Self-pay | Admitting: Orthopaedic Surgery

## 2015-01-23 ENCOUNTER — Other Ambulatory Visit: Payer: Self-pay | Admitting: Internal Medicine

## 2015-01-23 DIAGNOSIS — M545 Low back pain: Secondary | ICD-10-CM

## 2015-01-24 ENCOUNTER — Telehealth: Payer: Self-pay | Admitting: Family Medicine

## 2015-01-24 NOTE — Telephone Encounter (Signed)
Covering Dr [employer] box. Patient will need to come in for evaluation prior to receiving this medication as this medication is not on her list and this problem is not on her PMH.

## 2015-01-24 NOTE — Telephone Encounter (Signed)
Pt called because she has RLS. She would like to have a prescription of Requip called in or does she need to be seen first. She also has a bladder leaking issues when she sneezes, laughs, coughs. Please call and let her know if she needs to come in. jw

## 2015-01-31 ENCOUNTER — Ambulatory Visit (HOSPITAL_BASED_OUTPATIENT_CLINIC_OR_DEPARTMENT_OTHER): Payer: Commercial Managed Care - HMO

## 2015-01-31 ENCOUNTER — Ambulatory Visit
Admission: RE | Admit: 2015-01-31 | Discharge: 2015-01-31 | Disposition: A | Discharge status: Home / Self Care | Payer: Commercial Managed Care - HMO | Source: Ambulatory Visit | Attending: Orthopaedic Surgery | Admitting: Orthopaedic Surgery

## 2015-01-31 DIAGNOSIS — M545 Low back pain: Secondary | ICD-10-CM

## 2015-01-31 DIAGNOSIS — C50811 Malignant neoplasm of overlapping sites of right female breast: Secondary | ICD-10-CM

## 2015-01-31 DIAGNOSIS — M8580 Other specified disorders of bone density and structure, unspecified site: Secondary | ICD-10-CM

## 2015-01-31 MED ORDER — DENOSUMAB 60 MG/ML ~~LOC~~ SOLN
60.0000 mg | Freq: Once | SUBCUTANEOUS | Status: AC
  Administered 2015-01-31: 60 mg via SUBCUTANEOUS
  Filled 2015-01-31: qty 1

## 2015-01-31 NOTE — Patient Instructions (Signed)
Denosumab injection What is this medicine? DENOSUMAB (den oh sue mab) slows bone breakdown. Prolia is used to treat osteoporosis in women after menopause and in men. Xgeva is used to prevent bone fractures and other bone problems caused by cancer bone metastases. Xgeva is also used to treat giant cell tumor of the bone. This medicine may be used for other purposes; ask your health care provider or pharmacist if you have questions. COMMON BRAND NAME(S): Prolia, XGEVA What should I tell my health care provider before I take this medicine? They need to know if you have any of these conditions: -dental disease -eczema -infection or history of infections -kidney disease or on dialysis -low blood calcium or vitamin D -malabsorption syndrome -scheduled to have surgery or tooth extraction -taking medicine that contains denosumab -thyroid or parathyroid disease -an unusual reaction to denosumab, other medicines, foods, dyes, or preservatives -pregnant or trying to get pregnant -breast-feeding How should I use this medicine? This medicine is for injection under the skin. It is given by a health care professional in a hospital or clinic setting. If you are getting Prolia, a special MedGuide will be given to you by the pharmacist with each prescription and refill. Be sure to read this information carefully each time. For Prolia, talk to your pediatrician regarding the use of this medicine in children. Special care may be needed. For Xgeva, talk to your pediatrician regarding the use of this medicine in children. While this drug may be prescribed for children as young as 13 years for selected conditions, precautions do apply. Overdosage: If you think you've taken too much of this medicine contact a poison control center or emergency room at once. Overdosage: If you think you have taken too much of this medicine contact a poison control center or emergency room at once. NOTE: This medicine is only for  you. Do not share this medicine with others. What if I miss a dose? It is important not to miss your dose. Call your doctor or health care professional if you are unable to keep an appointment. What may interact with this medicine? Do not take this medicine with any of the following medications: -other medicines containing denosumab This medicine may also interact with the following medications: -medicines that suppress the immune system -medicines that treat cancer -steroid medicines like prednisone or cortisone This list may not describe all possible interactions. Give your health care provider a list of all the medicines, herbs, non-prescription drugs, or dietary supplements you use. Also tell them if you smoke, drink alcohol, or use illegal drugs. Some items may interact with your medicine. What should I watch for while using this medicine? Visit your doctor or health care professional for regular checks on your progress. Your doctor or health care professional may order blood tests and other tests to see how you are doing. Call your doctor or health care professional if you get a cold or other infection while receiving this medicine. Do not treat yourself. This medicine may decrease your body's ability to fight infection. You should make sure you get enough calcium and vitamin D while you are taking this medicine, unless your doctor tells you not to. Discuss the foods you eat and the vitamins you take with your health care professional. See your dentist regularly. Brush and floss your teeth as directed. Before you have any dental work done, tell your dentist you are receiving this medicine. Do not become pregnant while taking this medicine or for 5 months after stopping   it. Women should inform their doctor if they wish to become pregnant or think they might be pregnant. There is a potential for serious side effects to an unborn child. Talk to your health care professional or pharmacist for more  information. What side effects may I notice from receiving this medicine? Side effects that you should report to your doctor or health care professional as soon as possible: -allergic reactions like skin rash, itching or hives, swelling of the face, lips, or tongue -breathing problems -chest pain -fast, irregular heartbeat -feeling faint or lightheaded, falls -fever, chills, or any other sign of infection -muscle spasms, tightening, or twitches -numbness or tingling -skin blisters or bumps, or is dry, peels, or red -slow healing or unexplained pain in the mouth or jaw -unusual bleeding or bruising Side effects that usually do not require medical attention (Report these to your doctor or health care professional if they continue or are bothersome.): -muscle pain -stomach upset, gas This list may not describe all possible side effects. Call your doctor for medical advice about side effects. You may report side effects to FDA at 1-800-FDA-1088. Where should I keep my medicine? This medicine is only given in a clinic, doctor's office, or other health care setting and will not be stored at home. NOTE: This sheet is a summary. It may not cover all possible information. If you have questions about this medicine, talk to your doctor, pharmacist, or health care provider.  2015, [employer]vier/Gold Standard. (2012-05-04 12:37:47)  

## 2015-02-02 ENCOUNTER — Ambulatory Visit (INDEPENDENT_AMBULATORY_CARE_PROVIDER_SITE_OTHER): Payer: Commercial Managed Care - HMO | Admitting: Family Medicine

## 2015-02-02 ENCOUNTER — Encounter: Payer: Self-pay | Admitting: Family Medicine

## 2015-02-02 VITALS — BP 143/64 | HR 68 | Temp 98.1°F | Ht 62.0 in | Wt 203.6 lb

## 2015-02-02 DIAGNOSIS — Z720 Tobacco use: Secondary | ICD-10-CM

## 2015-02-02 DIAGNOSIS — G2581 Restless legs syndrome: Secondary | ICD-10-CM

## 2015-02-02 DIAGNOSIS — I2510 Atherosclerotic heart disease of native coronary artery without angina pectoris: Secondary | ICD-10-CM

## 2015-02-02 DIAGNOSIS — D649 Anemia, unspecified: Secondary | ICD-10-CM

## 2015-02-02 DIAGNOSIS — Z0000 Encounter for general adult medical examination without abnormal findings: Secondary | ICD-10-CM

## 2015-02-02 DIAGNOSIS — N393 Stress incontinence (female) (male): Secondary | ICD-10-CM | POA: Insufficient documentation

## 2015-02-02 LAB — LIPID PANEL
Cholesterol: 169 mg/dL (ref 0–200)
HDL: 33 mg/dL — ABNORMAL LOW (ref >=46)
LDL Cholesterol: 90 mg/dL (ref 0–99)
TRIGLYCERIDES: 228 mg/dL — AB (ref <150)
Total CHOL/HDL Ratio: 5.1 Ratio
VLDL: 46 mg/dL — ABNORMAL HIGH (ref 0–40)

## 2015-02-02 LAB — FERRITIN: Ferritin: 35 ng/mL (ref 10–291)

## 2015-02-02 NOTE — Assessment & Plan Note (Signed)
Information given about cessation with Rph, Cheron Schaumann.   - Start nicotine gum - Consider Wellbutrin XL in future

## 2015-02-02 NOTE — Progress Notes (Signed)
Subjective:    Martha Mora is a 69 y.o. female who presents for Medicare Annual/Subsequent preventive examination.  Preventive Screening-Counseling & Management  Tobacco History  Smoking status  . Current Every Day Smoker -- 1.50 packs/day for 51 years  . Types: Cigarettes  Smokeless tobacco  . Never Used     Problems Prior to Visit 1. Urinary incontinence  2. Sleep insomnia   Current Problems (verified) Patient Active Problem List   Diagnosis Date Noted  . Stress incontinence 02/02/2015  . Preventative health care 05/23/2014  . Tobacco abuse 05/23/2014  . Mitral valve regurgitation 05/23/2014  . Breast cancer, right breast 07/24/2013  . Postmenopausal bleeding 07/14/2013  . Osteopenia 12/27/2011  . ANXIETY 07/19/2009  . OSTEOARTHRITIS 07/19/2009  . NAUSEA 07/19/2009  . IRRITABLE BOWEL SYNDROME 11/17/2008  . ULCER-GASTRIC 07/19/2008  . PERSONAL HX COLONIC POLYPS 07/19/2008  . COLONIC POLYPS, ADENOMATOUS 11/17/2007  . CARCINOMA IN SITU OF BREAST 11/17/2007  . HYPERLIPIDEMIA 11/17/2007  . OBESITY, UNSPECIFIED 11/17/2007  . DEPRESSION 11/17/2007  . HYPERTENSION 11/17/2007  . Cor Athrscl-Uns Vessel 11/17/2007  . HEMORRHOIDS, INTERNAL 11/17/2007  . GERD 11/17/2007  . PEPTIC STRICTURE 11/17/2007  . SLEEP APNEA 11/17/2007  . Diverticulosis of Colon (without Mention of Hemorrhage) 09/05/2005  . REFLUX ESOPHAGITIS 06/01/2002  . ESOPHAGEAL STRICTURE 06/01/2002  . DUODENITIS, WITHOUT HEMORRHAGE 06/01/2002    Medications Prior to Visit Current Outpatient Prescriptions on File Prior to Visit  Medication Sig Dispense Refill  . amLODipine (NORVASC) 5 MG tablet Take 5 mg by mouth at bedtime.     [name] aspirin 81 MG tablet Take 81 mg by mouth daily.     . B Complex-C (B-COMPLEX WITH VITAMIN C) tablet Take 1 tablet by mouth daily.    . Calcium Carbonate-Vitamin D 600-400 MG-UNIT per tablet Take 1 tablet by mouth 2 (two) times daily.     . carvedilol (COREG) 3.125 MG tablet  Take 3.125 mg by mouth 2 (two) times daily.  0  . citalopram (CELEXA) 40 MG tablet Take 20 mg by mouth 2 (two) times daily.     . diclofenac (VOLTAREN) 50 MG EC tablet Take 50 mg by mouth daily.    . ferrous sulfate 325 (65 FE) MG tablet Take 325 mg by mouth daily with breakfast.     . losartan (COZAAR) 50 MG tablet Take 50 mg by mouth daily.    . Magnesium Oxide 500 MG CAPS Take 1 capsule by mouth daily.    . meclizine (ANTIVERT) 25 MG tablet   3  . Multiple Vitamin (MULTIVITAMIN) tablet Take 1 tablet by mouth 3 (three) times daily. Herbal life multivitamin    . Omega-3 Fatty Acids (FISH OIL) 1000 MG CAPS Take 1 capsule by mouth 3 (three) times daily.    [name] omeprazole (PRILOSEC) 40 MG capsule TAKE 1 CAPSULE (40 MG TOTAL) BY MOUTH DAILY. 30 capsule 6  . OVER THE COUNTER MEDICATION Take 1 tablet by mouth daily. Ocular defense    . pravastatin (PRAVACHOL) 40 MG tablet Take 40 mg by mouth daily.      . tamoxifen (NOLVADEX) 20 MG tablet TAKE 1 TABLET BY MOUTH EVERY DAY 30 tablet 11  . traZODone (DESYREL) 100 MG tablet TAKE 1 TABLET BY MOUTH AT BEDTIME AS NEEDED FOR SLEEP 30 tablet 3  . zolendronic acid (ZOMETA) 4 MG/5ML injection Inject 4 mg into the vein every 6 (six) months.    . zoster vaccine live, PF, (ZOSTAVAX) 69678 UNT/0.65ML injection Inject 19,400 Units into  the skin once. 1 each 0   No current facility-administered medications on file prior to visit.    Current Medications (verified) Current Outpatient Prescriptions  Medication Sig Dispense Refill  . amLODipine (NORVASC) 5 MG tablet Take 5 mg by mouth at bedtime.     [name] aspirin 81 MG tablet Take 81 mg by mouth daily.     . B Complex-C (B-COMPLEX WITH VITAMIN C) tablet Take 1 tablet by mouth daily.    . Calcium Carbonate-Vitamin D 600-400 MG-UNIT per tablet Take 1 tablet by mouth 2 (two) times daily.     . carvedilol (COREG) 3.125 MG tablet Take 3.125 mg by mouth 2 (two) times daily.  0  . citalopram (CELEXA) 40 MG tablet Take 20 mg  by mouth 2 (two) times daily.     . diclofenac (VOLTAREN) 50 MG EC tablet Take 50 mg by mouth daily.    . ferrous sulfate 325 (65 FE) MG tablet Take 325 mg by mouth daily with breakfast.     . losartan (COZAAR) 50 MG tablet Take 50 mg by mouth daily.    . Magnesium Oxide 500 MG CAPS Take 1 capsule by mouth daily.    . meclizine (ANTIVERT) 25 MG tablet   3  . Multiple Vitamin (MULTIVITAMIN) tablet Take 1 tablet by mouth 3 (three) times daily. Herbal life multivitamin    . Omega-3 Fatty Acids (FISH OIL) 1000 MG CAPS Take 1 capsule by mouth 3 (three) times daily.    [name] omeprazole (PRILOSEC) 40 MG capsule TAKE 1 CAPSULE (40 MG TOTAL) BY MOUTH DAILY. 30 capsule 6  . OVER THE COUNTER MEDICATION Take 1 tablet by mouth daily. Ocular defense    . pravastatin (PRAVACHOL) 40 MG tablet Take 40 mg by mouth daily.      . tamoxifen (NOLVADEX) 20 MG tablet TAKE 1 TABLET BY MOUTH EVERY DAY 30 tablet 11  . traZODone (DESYREL) 100 MG tablet TAKE 1 TABLET BY MOUTH AT BEDTIME AS NEEDED FOR SLEEP 30 tablet 3  . zolendronic acid (ZOMETA) 4 MG/5ML injection Inject 4 mg into the vein every 6 (six) months.    . zoster vaccine live, PF, (ZOSTAVAX) 19379 UNT/0.65ML injection Inject 19,400 Units into the skin once. 1 each 0   No current facility-administered medications for this visit.     Allergies (verified) Scallops and Penicillins   PAST HISTORY  Family History Family History  Problem Relation Age of Onset  . Colon cancer Paternal Grandmother   . Heart disease Maternal Grandfather   . Heart disease Maternal Grandmother   . Alcohol abuse Mother   . Depression Mother   . Hypertension Mother   . Stroke Mother   . Alcohol abuse Father     Social History History  Substance Use Topics  . Smoking status: Current Every Day Smoker -- 1.50 packs/day for 51 years    Types: Cigarettes  . Smokeless tobacco: Never Used  . Alcohol Use: No     Are there smokers in your home (other than you)? Yes  Risk  Factors Current exercise habits: The patient does not participate in regular exercise at present.  Dietary issues discussed: None   Depression Screen (Note: if answer to either of the following is "Yes", a more complete depression screening is indicated)   Over the past two weeks, have you felt down, depressed or hopeless? Yes  Over the past two weeks, have you felt little interest or pleasure in doing things? No  Have you lost  interest or pleasure in daily life? No  Do you often feel hopeless? No  Do you cry easily over simple problems? No  Activities of Daily Living In your present state of health, do you have any difficulty performing the following activities?:  Driving? No  Managing money?  No Feeding yourself? No  Getting from bed to chair? No Climbing a flight of stairs? No Preparing food and eating?: No Bathing or showering? No Getting dressed: No Getting to the toilet? No Using the toilet:No Moving around from place to place: No In the past year have you fallen or had a near fall?:No  Cognitive Testing  Alert? Yes  Normal Appearance?Yes  Oriented to person? Yes  Place? Yes   Time? Yes  Recall of three objects?  Yes  Can perform simple calculations? Yes  Displays appropriate judgment?Yes  Can read the correct time from a watch face?Yes   Advanced Directives have been discussed with the patient? No  List the Names of Other Physician/Practitioners you currently use: 1.    Indicate any recent Medical Services you may have received from other than [facility] providers in the past year (date may be approximate).  Immunization History  Administered Date(s) Administered  . Influenza-Unspecified 09/09/2012, 08/02/2014  . Pneumococcal Conjugate-13 05/23/2014  . Pneumococcal Polysaccharide-23 10/01/2012  . Tdap 05/23/2014    Screening Tests Health Maintenance  Topic Date Due  . ZOSTAVAX  02/08/2016 (Originally 12/17/2003)  . INFLUENZA VACCINE  06/19/2015  . MAMMOGRAM   05/04/2016  . COLONOSCOPY  07/22/2018  . TETANUS/TDAP  05/23/2024  . DEXA SCAN  Completed  . PNA vac Low Risk Adult  Completed    All answers were reviewed with the patient and necessary referrals were made:  Kennith Maes, DO   02/02/2015   History reviewed: allergies, current medications, past family history, past medical history, past social history, past surgical history and problem list  Review of Systems Pertinent items are noted in HPI.    Objective:    Body mass index is 37.23 kg/(m^2). BP 143/64 mmHg  Pulse 68  Temp(Src) 98.1 F (36.7 C) (Oral)  Ht 5\' 2"  (1.575 m)  Wt 203 lb 9.6 oz (92.352 kg)  BMI 37.23 kg/m2  BP 143/64 mmHg  Pulse 68  Temp(Src) 98.1 F (36.7 C) (Oral)  Ht 5\' 2"  (1.575 m)  Wt 203 lb 9.6 oz (92.352 kg)  BMI 37.23 kg/m2 Heart: normal apical impulse and regular rate and rhythm     Assessment:      Wellness Exam, Female Medicare     Plan:     During the course of the visit the patient was educated and counseled about appropriate screening and preventive services including:    Influenza vaccine  Colorectal cancer screening  Smoking cessation counseling  Patient Instructions (the written plan) was given to the patient.  Medicare Attestation I have personally reviewed: The patient's medical and social history Their use of alcohol, tobacco or illicit drugs Their current medications and supplements The patient's functional ability including ADLs,fall risks, home safety risks, cognitive, and hearing and visual impairment Diet and physical activities Evidence for depression or mood disorders  The patient's weight, height, BMI, and visual acuity have been recorded in the chart.  I have made referrals, counseling, and provided education to the patient based on review of the above and I have provided the patient with a written personalized care plan for preventive services.     Kennith Maes, DO   02/02/2015

## 2015-02-02 NOTE — Assessment & Plan Note (Signed)
Start Kegel exercises - Referral back to gynecology as this has been prolonged issue after TAH

## 2015-02-03 ENCOUNTER — Telehealth: Payer: Self-pay | Admitting: Family Medicine

## 2015-02-03 ENCOUNTER — Encounter: Payer: Self-pay | Admitting: Family Medicine

## 2015-02-03 NOTE — Telephone Encounter (Signed)
Requesting results from last visit

## 2015-02-03 NOTE — Telephone Encounter (Signed)
Will forward to MD to give results of lab results. Chenae Brager,CMA

## 2015-02-05 NOTE — Telephone Encounter (Signed)
Letter formulated for distribution.  Please let her know this will come in the mail, but otherwise her cholesterol was good and her ferritin (iron levels) were low.    Thanks, Tamela Oddi. Awanda Mink, DO of [facility] 02/05/2015, 8:18 AM

## 2015-02-06 NOTE — Telephone Encounter (Signed)
Would recommend starting Iron Sulfate 325 mg TID for now and f/u in one month as discussed previously.  THanks [employer]. Awanda Mink, DO of [facility] 02/06/2015, 3:09 PM

## 2015-02-06 NOTE — Telephone Encounter (Signed)
Informed patient of below, but she states that she still is having the restless leg syndrome which she states that blood work was done for. She would like to know what she should do know.

## 2015-02-07 NOTE — Telephone Encounter (Signed)
Patient called back and has been informed of below.

## 2015-02-28 ENCOUNTER — Telehealth: Payer: Self-pay | Admitting: Family Medicine

## 2015-02-28 NOTE — Telephone Encounter (Signed)
Needs referral sent [facility]  appt is 03-02-15

## 2015-03-01 NOTE — Telephone Encounter (Signed)
Will send to MD to place referral.  Will need to be preapproved for [employer] patient once MD puts in referral. Martha Mora, [name]

## 2015-03-01 NOTE — Telephone Encounter (Signed)
[employer] Auth # [id number] Approved 03/02/15 to 08/29/15. Will fax confirmation to [facility]. [phone]

## 2015-03-14 ENCOUNTER — Telehealth: Payer: Self-pay | Admitting: Family Medicine

## 2015-03-14 NOTE — Telephone Encounter (Signed)
Did take iron for restless legs. It worked for [employer] but no longer does. Would like a Rx for this Please advise

## 2015-03-14 NOTE — Telephone Encounter (Signed)
Would like MRI results from March 2016

## 2015-03-14 NOTE — Telephone Encounter (Signed)
Spoke with patient and informed her that Dr. Rhona Raider is the one who ordered the MRI and to contact his office

## 2015-03-17 NOTE — Telephone Encounter (Signed)
Would like a status on whether or not she can receive an Rx for the restless leg syndrome. As per the previous message, what she was taking before no longer helps. Thanks [employer], ASA

## 2015-03-20 NOTE — Telephone Encounter (Signed)
Forwarding to PCP.

## 2015-03-24 ENCOUNTER — Telehealth: Payer: Self-pay | Admitting: Family Medicine

## 2015-03-24 MED ORDER — ROPINIROLE HCL 0.25 MG PO TABS: 0.2500 mg | ORAL_TABLET | Freq: Every day | ORAL | Status: DC

## 2015-03-24 NOTE — Telephone Encounter (Signed)
Pt called and really would like Requip for her RLS. She can not sleep because of this. Please send in . jw

## 2015-03-24 NOTE — Telephone Encounter (Signed)
Called in, please let her know.  Thanks [employer]. Awanda Mink, DO of [facility] 03/24/2015, 10:46 AM

## 2015-03-24 NOTE — Telephone Encounter (Signed)
Referral placed in [employer].  Since this is retro (pt seen yesterday) it was suspended. Keighan Amezcua, [name]

## 2015-03-24 NOTE — Telephone Encounter (Signed)
Pt called and needs a referral to see Dr. [name]. jw

## 2015-03-27 ENCOUNTER — Telehealth: Payer: Self-pay | Admitting: Family Medicine

## 2015-03-27 NOTE — Telephone Encounter (Signed)
Pt is calling April back to let her know that she did pick up the medication. jw

## 2015-03-27 NOTE — Telephone Encounter (Signed)
Noted. Martha Mora  

## 2015-03-27 NOTE — Telephone Encounter (Signed)
LVM for pt to return call to be sure she was aware of below. Katharina Caper, April D

## 2015-03-28 ENCOUNTER — Other Ambulatory Visit: Payer: Self-pay | Admitting: *Deleted

## 2015-03-28 DIAGNOSIS — C50911 Malignant neoplasm of unspecified site of right female breast: Secondary | ICD-10-CM

## 2015-03-28 MED ORDER — TAMOXIFEN CITRATE 20 MG PO TABS: ORAL_TABLET | ORAL | Status: DC

## 2015-03-28 NOTE — Telephone Encounter (Signed)
Patient called requesting refill from Dr. Burr Medico and [facility] Mora is the pharmacy of choice for mail order.

## 2015-04-03 ENCOUNTER — Telehealth: Payer: Self-pay | Admitting: Family Medicine

## 2015-04-03 ENCOUNTER — Other Ambulatory Visit: Payer: Self-pay

## 2015-04-03 DIAGNOSIS — Z853 Personal history of malignant neoplasm of breast: Secondary | ICD-10-CM

## 2015-04-03 DIAGNOSIS — Z1231 Encounter for screening mammogram for malignant neoplasm of breast: Secondary | ICD-10-CM

## 2015-04-03 NOTE — Telephone Encounter (Signed)
Pt called and would [facility]. jw

## 2015-04-03 NOTE — Telephone Encounter (Signed)
I am covering for Dr. Awanda Mink who is away from the office.  Order entered for screening mammogram (this is what was recommended by breast radiologist during her mammo last year). Please inform patient.  Leeanne Rio, MD

## 2015-04-04 NOTE — Telephone Encounter (Signed)
Pt is aware and will contact breast center to schedule. Martha Mora,CMA

## 2015-05-04 ENCOUNTER — Other Ambulatory Visit: Payer: Self-pay | Admitting: Family Medicine

## 2015-05-05 ENCOUNTER — Telehealth: Payer: Self-pay | Admitting: Family Medicine

## 2015-05-05 MED ORDER — TRAZODONE HCL 100 MG PO TABS: 100.0000 mg | ORAL_TABLET | Freq: Every evening | ORAL | Status: DC | PRN

## 2015-05-05 NOTE — Telephone Encounter (Signed)
If she is going to need something stronger than requip, she'll have to been in clinic for further Sx assessment and management.   Thanks [employer]. Awanda Mink, DO of [facility] 05/05/2015, 2:22 PM

## 2015-05-05 NOTE — Telephone Encounter (Signed)
Pt says requip is not helping her, she needs something stronger. Would like something to be called into [facility]. Also needs a refill on her trazadone, says if we can call in a 90 day supply to [facility] she can get this for free.

## 2015-05-15 NOTE — Telephone Encounter (Signed)
Contacted pt and informed her of below and she stated she would call and set up and appointment for this. Katharina Caper, Seline Enzor D, [name]

## 2015-05-18 ENCOUNTER — Other Ambulatory Visit: Payer: Self-pay | Admitting: Family Medicine

## 2015-05-24 ENCOUNTER — Encounter: Payer: Self-pay | Admitting: Family Medicine

## 2015-05-24 ENCOUNTER — Ambulatory Visit (INDEPENDENT_AMBULATORY_CARE_PROVIDER_SITE_OTHER): Payer: Commercial Managed Care - HMO | Admitting: Family Medicine

## 2015-05-24 VITALS — BP 129/55 | HR 66 | Temp 98.8°F | Ht 62.0 in | Wt 197.0 lb

## 2015-05-24 DIAGNOSIS — I10 Essential (primary) hypertension: Secondary | ICD-10-CM | POA: Diagnosis not present

## 2015-05-24 DIAGNOSIS — F329 Major depressive disorder, single episode, unspecified: Secondary | ICD-10-CM | POA: Diagnosis not present

## 2015-05-24 DIAGNOSIS — G2581 Restless legs syndrome: Secondary | ICD-10-CM

## 2015-05-24 DIAGNOSIS — F32A Depression, unspecified: Secondary | ICD-10-CM

## 2015-05-24 MED ORDER — ROPINIROLE HCL 1 MG PO TABS: 1.0000 mg | ORAL_TABLET | Freq: Every day | ORAL | Status: DC

## 2015-05-24 NOTE — Patient Instructions (Signed)
Thank you for coming to see me today. It was a pleasure. Today we talked about:   Hypertension: No changes to medications today since your Cardiologist is managing. Your blood pressure is great. We had discussed possibly discontinuing the amlodipine and increasing Coreg (so you can take fewer pills), but this may not be feasible with your heart rate.  Restless leg: I would like you to take Requip 0.75mg  2 hours before bedtime and then increase to 1mg  2 hours before bedtime next week  Depression: I'm glad your mood is great. No changes to your medication   Please make an appointment to see me in 3-6 months for follow-up.  If you have any questions or concerns, please do not hesitate to call the office at [phone].  Sincerely,  [name], MD

## 2015-05-24 NOTE — Progress Notes (Signed)
    Subjective    Martha Mora is a 69 y.o. female that presents for a follow-up visit for chronic issues.   1. Restless leg syndrome: Chronic. She has a feeling of "pins and needles" and movements of legs. This happens with any sleeping. She has been using hot water which helped with soothing the symptoms  2. Hypertension: She is adherent with Coreg, Amlodipine and Losartan. She states she has infrequent chest pain that improves with deep breathes. She doe snot have exertional chest pain. No associated dyspnea or diaphoresis.   3. Depression: Chronic. Adherent with Celexa 20mg  BID. No complaints. Feels very good. No SI.  History  Substance Use Topics  . Smoking status: Current Every Day Smoker -- 1.50 packs/day for 51 years    Types: Cigarettes  . Smokeless tobacco: Never Used  . Alcohol Use: No    Allergies  Allergen Reactions  . Scallops [Shellfish Allergy] Nausea And Vomiting  . Penicillins Other (See Comments)    Red bumps all over stomach    Meds ordered this encounter  Medications  . rOPINIRole (REQUIP) 1 MG tablet    Sig: Take 1 tablet (1 mg total) by mouth at bedtime.    Dispense:  30 tablet    Refill:  0    ROS  Per HPI   Objective   BP 129/55 mmHg  Pulse 66  Temp(Src) 98.8 F (37.1 C) (Oral)  Ht 5\' 2"  (1.575 m)  Wt 197 lb (89.359 kg)  BMI 36.02 kg/m2  General: Well appearing, no distress Neuro: Alert, oriented Psych: Full affect  Assessment and Plan   Please refer to problem based charting of assessment and plan

## 2015-05-24 NOTE — Assessment & Plan Note (Signed)
PHQ-9: 1 (Secondary to Restless Leg Syndrome with trouble falling asleep). Maintained well on Celexa 20mg  BID  Continue Celexa 20mg  BID (does not need a refill today)

## 2015-05-24 NOTE — Assessment & Plan Note (Signed)
Currently taking Requip 0.5mg  qhs. Improvement in symptoms  0.75mg  x1 week then up to 1mg  qHS

## 2015-05-24 NOTE — Assessment & Plan Note (Signed)
Controlled today. Atypical chest pain, however with history, will need close monitoring. Patient followed by Cardiology. States she does not have nitroglycerin. She does have follow-up tomorrow with her cardiologist, however. Might be able to come off of amlodipine (minimize fall risk) if can increase coreg, however, with marginal HR, may not be tolerable  Management per cardiologist

## 2015-06-14 ENCOUNTER — Ambulatory Visit
Admission: RE | Admit: 2015-06-14 | Discharge: 2015-06-14 | Disposition: A | Discharge status: Home / Self Care | Payer: Commercial Managed Care - HMO | Source: Ambulatory Visit

## 2015-06-14 DIAGNOSIS — Z1231 Encounter for screening mammogram for malignant neoplasm of breast: Secondary | ICD-10-CM

## 2015-06-15 ENCOUNTER — Ambulatory Visit: Payer: Commercial Managed Care - HMO

## 2015-06-16 ENCOUNTER — Telehealth: Payer: Self-pay | Admitting: Family Medicine

## 2015-06-16 DIAGNOSIS — M1990 Unspecified osteoarthritis, unspecified site: Secondary | ICD-10-CM

## 2015-06-16 NOTE — Telephone Encounter (Signed)
Requesting a referral to ortho doctor/ Dr. Normajean Glasgow at [facility] for her back

## 2015-06-20 ENCOUNTER — Other Ambulatory Visit: Payer: Self-pay | Admitting: *Deleted

## 2015-06-20 MED ORDER — ROPINIROLE HCL 1 MG PO TABS: 1.0000 mg | ORAL_TABLET | Freq: Every day | ORAL | Status: DC

## 2015-06-23 ENCOUNTER — Telehealth: Payer: Self-pay | Admitting: Family Medicine

## 2015-06-23 NOTE — Telephone Encounter (Signed)
Pt calling because medication for restless leg syndrome was increased, however, she states that she still must take a shower to get her legs to calm down and she still cannot sleep. States that she has not slept at all. Please advise on next steps. Thank you, Fonda Kinder, ASA

## 2015-07-13 NOTE — Telephone Encounter (Signed)
Voice mail. Will await a call back.

## 2015-07-19 ENCOUNTER — Other Ambulatory Visit: Payer: Self-pay | Admitting: *Deleted

## 2015-07-19 DIAGNOSIS — C50911 Malignant neoplasm of unspecified site of right female breast: Secondary | ICD-10-CM

## 2015-07-20 ENCOUNTER — Encounter: Payer: Self-pay | Admitting: Hematology

## 2015-07-20 ENCOUNTER — Other Ambulatory Visit (HOSPITAL_BASED_OUTPATIENT_CLINIC_OR_DEPARTMENT_OTHER): Payer: Commercial Managed Care - HMO

## 2015-07-20 ENCOUNTER — Ambulatory Visit (HOSPITAL_BASED_OUTPATIENT_CLINIC_OR_DEPARTMENT_OTHER): Payer: Commercial Managed Care - HMO | Admitting: Hematology

## 2015-07-20 ENCOUNTER — Telehealth: Payer: Self-pay | Admitting: Hematology

## 2015-07-20 VITALS — BP 128/59 | HR 71 | Temp 99.0°F | Resp 18 | Ht 62.0 in | Wt 200.4 lb

## 2015-07-20 DIAGNOSIS — C50811 Malignant neoplasm of overlapping sites of right female breast: Secondary | ICD-10-CM

## 2015-07-20 DIAGNOSIS — C50911 Malignant neoplasm of unspecified site of right female breast: Secondary | ICD-10-CM

## 2015-07-20 DIAGNOSIS — C773 Secondary and unspecified malignant neoplasm of axilla and upper limb lymph nodes: Secondary | ICD-10-CM

## 2015-07-20 DIAGNOSIS — Z23 Encounter for immunization: Secondary | ICD-10-CM

## 2015-07-20 LAB — CBC WITH DIFFERENTIAL/PLATELET
BASO%: 0.5 % (ref 0.0–2.0)
BASOS ABS: 0.1 10*3/uL (ref 0.0–0.1)
EOS%: 4.4 % (ref 0.0–7.0)
Eosinophils Absolute: 0.5 10*3/uL (ref 0.0–0.5)
HCT: 40 % (ref 34.8–46.6)
HGB: 13.2 g/dL (ref 11.6–15.9)
LYMPH%: 23.4 % (ref 14.0–49.7)
MCH: 31.2 pg (ref 25.1–34.0)
MCHC: 33 g/dL (ref 31.5–36.0)
MCV: 94.6 fL (ref 79.5–101.0)
MONO#: 0.8 10*3/uL (ref 0.1–0.9)
MONO%: 6.5 % (ref 0.0–14.0)
NEUT#: 8 10*3/uL — ABNORMAL HIGH (ref 1.5–6.5)
NEUT%: 65.2 % (ref 38.4–76.8)
PLATELETS: 256 10*3/uL (ref 145–400)
RBC: 4.23 10*6/uL (ref 3.70–5.45)
RDW: 14.5 % (ref 11.2–14.5)
WBC: 12.2 10*3/uL — ABNORMAL HIGH (ref 3.9–10.3)
lymph#: 2.9 10*3/uL (ref 0.9–3.3)

## 2015-07-20 LAB — COMPREHENSIVE METABOLIC PANEL (CC13)
ALT: 13 U/L (ref 0–55)
ANION GAP: 5 meq/L (ref 3–11)
AST: 17 U/L (ref 5–34)
Albumin: 3.1 g/dL — ABNORMAL LOW (ref 3.5–5.0)
Alkaline Phosphatase: 61 U/L (ref 40–150)
BUN: 19.5 mg/dL (ref 7.0–26.0)
CALCIUM: 8.7 mg/dL (ref 8.4–10.4)
CHLORIDE: 111 meq/L — AB (ref 98–109)
CO2: 26 meq/L (ref 22–29)
Creatinine: 0.8 mg/dL (ref 0.6–1.1)
EGFR: 79 mL/min/{1.73_m2} — AB (ref >90)
Glucose: 92 mg/dl (ref 70–140)
POTASSIUM: 4.6 meq/L (ref 3.5–5.1)
Sodium: 142 mEq/L (ref 136–145)
Total Bilirubin: 0.28 mg/dL (ref 0.20–1.20)
Total Protein: 5.7 g/dL — ABNORMAL LOW (ref 6.4–8.3)

## 2015-07-20 MED ORDER — INFLUENZA VAC SPLIT QUAD 0.5 ML IM SUSY
0.5000 mL | PREFILLED_SYRINGE | Freq: Once | INTRAMUSCULAR | Status: AC
  Administered 2015-07-20: 0.5 mL via INTRAMUSCULAR
  Filled 2015-07-20: qty 0.5

## 2015-07-20 MED ORDER — NICOTINE 14 MG/24HR TD PT24: 14.0000 mg | MEDICATED_PATCH | Freq: Every day | TRANSDERMAL | Status: DC
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
## 2015-07-20 NOTE — Telephone Encounter (Signed)
Gave and printed appt sched and avs for March

## 2015-07-26 ENCOUNTER — Other Ambulatory Visit: Payer: Self-pay | Admitting: *Deleted

## 2015-07-26 MED ORDER — ROPINIROLE HCL 1 MG PO TABS: 1.0000 mg | ORAL_TABLET | Freq: Every day | ORAL | Status: DC

## 2015-07-27 ENCOUNTER — Ambulatory Visit: Payer: Commercial Managed Care - HMO

## 2015-08-03 ENCOUNTER — Other Ambulatory Visit: Payer: Self-pay | Admitting: *Deleted

## 2015-08-03 ENCOUNTER — Ambulatory Visit (HOSPITAL_BASED_OUTPATIENT_CLINIC_OR_DEPARTMENT_OTHER): Payer: Commercial Managed Care - HMO

## 2015-08-03 ENCOUNTER — Other Ambulatory Visit: Payer: Self-pay | Admitting: Hematology

## 2015-08-03 VITALS — BP 124/50 | HR 56 | Temp 99.2°F

## 2015-08-03 DIAGNOSIS — M8580 Other specified disorders of bone density and structure, unspecified site: Secondary | ICD-10-CM

## 2015-08-03 MED ORDER — EPINEPHRINE HCL 0.1 MG/ML IJ SOLN
0.2500 mg | Freq: Once | INTRAMUSCULAR | Status: DC | PRN
Start: 2015-08-03 — End: 2015-08-03

## 2015-08-03 MED ORDER — EPINEPHRINE HCL 0.1 MG/ML IJ SOLN: 0.2500 mg | Freq: Once | INTRAMUSCULAR | Status: DC | PRN

## 2015-08-03 MED ORDER — ALBUTEROL SULFATE (2.5 MG/3ML) 0.083% IN NEBU
2.5000 mg | INHALATION_SOLUTION | Freq: Once | RESPIRATORY_TRACT | Status: AC | PRN
  Filled 2015-08-03: qty 3

## 2015-08-03 MED ORDER — DENOSUMAB 60 MG/ML ~~LOC~~ SOLN
60.0000 mg | Freq: Once | SUBCUTANEOUS | Status: AC
  Administered 2015-08-03: 60 mg via SUBCUTANEOUS
  Filled 2015-08-03: qty 1

## 2015-08-03 MED ORDER — SODIUM CHLORIDE 0.9 % IV SOLN: Freq: Once | INTRAVENOUS | Status: DC | PRN

## 2015-08-03 MED ORDER — METHYLPREDNISOLONE SODIUM SUCC 125 MG IJ SOLR: 125.0000 mg | Freq: Once | INTRAMUSCULAR | Status: DC | PRN

## 2015-08-03 MED ORDER — DIPHENHYDRAMINE HCL 50 MG/ML IJ SOLN: 25.0000 mg | Freq: Once | INTRAMUSCULAR | Status: DC | PRN

## 2015-08-03 MED ORDER — DIPHENHYDRAMINE HCL 50 MG/ML IJ SOLN: 50.0000 mg | Freq: Once | INTRAMUSCULAR | Status: DC | PRN

## 2015-08-03 MED ORDER — SODIUM CHLORIDE 0.9 % IV SOLN: Freq: Once | INTRAVENOUS | Status: DC

## 2015-08-03 NOTE — Addendum Note (Signed)
Addended by: Neysa Hotter on: 08/03/2015 02:16 PM   Modules accepted: Orders

## 2015-08-23 ENCOUNTER — Other Ambulatory Visit: Payer: Self-pay | Admitting: *Deleted

## 2015-08-23 MED ORDER — ROPINIROLE HCL 1 MG PO TABS: 1.0000 mg | ORAL_TABLET | Freq: Every day | ORAL | Status: DC

## 2015-08-23 NOTE — Telephone Encounter (Signed)
Patient needs office visit please advise.  Refilled x1

## 2015-09-01 ENCOUNTER — Other Ambulatory Visit: Payer: Self-pay | Admitting: Hematology

## 2015-09-20 ENCOUNTER — Other Ambulatory Visit: Payer: Self-pay | Admitting: *Deleted

## 2015-09-20 MED ORDER — ROPINIROLE HCL 1 MG PO TABS: 1.0000 mg | ORAL_TABLET | Freq: Every day | ORAL | Status: DC

## 2015-09-22 ENCOUNTER — Telehealth: Payer: Self-pay | Admitting: Family Medicine

## 2015-09-22 NOTE — Telephone Encounter (Signed)
Pt called and has been taking Requip for about 3-4 months 1 pill at bedtime. She said that it was helping at the beginning and now it is back to the way it was before starting the medication. She would like to know if she can start taking 2 pills at bedtime. Please call and let her know and also if this is possible call in a new prescription. jwp

## 2015-09-25 ENCOUNTER — Other Ambulatory Visit: Payer: Self-pay | Admitting: *Deleted

## 2015-09-27 MED ORDER — ROPINIROLE HCL 2 MG PO TABS: 2.0000 mg | ORAL_TABLET | Freq: Every day | ORAL | Status: DC

## 2015-09-27 NOTE — Telephone Encounter (Signed)
Okay to take 1.5 tablets (1.5mg ) for about one week and if no improvement, titrate to 2 tablets (2mg ) after one week.

## 2015-09-27 NOTE — Telephone Encounter (Signed)
LMOVM for callback. . Fleeger, Jessica Dawn  

## 2015-09-27 NOTE — Telephone Encounter (Signed)
Has gone to 2 tablets per night and is working fine but will need a new RX call in for that.  [facility] [address]

## 2015-10-02 ENCOUNTER — Telehealth: Payer: Self-pay | Admitting: Family Medicine

## 2015-10-02 DIAGNOSIS — I25119 Atherosclerotic heart disease of native coronary artery with unspecified angina pectoris: Secondary | ICD-10-CM

## 2015-10-02 NOTE — Telephone Encounter (Signed)
Pt called and would like a referral to her heart doctor Dr [name]. jw

## 2015-10-04 NOTE — Telephone Encounter (Signed)
Contacted pt to inform her of below and she stated that it has been taken care of and he has sent the Rx for double. Martha Mora, Martha Mora D, [name]

## 2015-10-05 NOTE — Telephone Encounter (Signed)
Called and spoke to [facility] at Dr. Merrilee Jansky office. Pt's [facility] referral is still valid and their office will call our office directly when a new referral is needed.

## 2015-10-23 ENCOUNTER — Ambulatory Visit (INDEPENDENT_AMBULATORY_CARE_PROVIDER_SITE_OTHER): Payer: Commercial Managed Care - HMO | Admitting: Family Medicine

## 2015-10-23 VITALS — BP 137/80 | HR 67 | Temp 98.4°F | Ht 62.0 in | Wt 201.8 lb

## 2015-10-23 DIAGNOSIS — F329 Major depressive disorder, single episode, unspecified: Secondary | ICD-10-CM | POA: Diagnosis not present

## 2015-10-23 DIAGNOSIS — N3946 Mixed incontinence: Secondary | ICD-10-CM

## 2015-10-23 DIAGNOSIS — F32A Depression, unspecified: Secondary | ICD-10-CM

## 2015-10-23 DIAGNOSIS — I10 Essential (primary) hypertension: Secondary | ICD-10-CM

## 2015-10-23 DIAGNOSIS — R5383 Other fatigue: Secondary | ICD-10-CM

## 2015-10-23 LAB — COMPLETE METABOLIC PANEL WITH GFR
ALT: 11 U/L (ref 6–29)
AST: 21 U/L (ref 10–35)
Albumin: 3.7 g/dL (ref 3.6–5.1)
Alkaline Phosphatase: 43 U/L (ref 33–130)
BUN: 14 mg/dL (ref 7–25)
CHLORIDE: 107 mmol/L (ref 98–110)
CO2: 25 mmol/L (ref 20–31)
CREATININE: 0.74 mg/dL (ref 0.60–0.93)
Calcium: 8.8 mg/dL (ref 8.6–10.4)
GFR, Est Non African American: 82 mL/min (ref >=60)
Glucose, Bld: 82 mg/dL (ref 65–99)
Potassium: 4.5 mmol/L (ref 3.5–5.3)
Sodium: 141 mmol/L (ref 135–146)
Total Bilirubin: 0.3 mg/dL (ref 0.2–1.2)
Total Protein: 5.7 g/dL — ABNORMAL LOW (ref 6.1–8.1)

## 2015-10-23 LAB — CBC WITH DIFFERENTIAL/PLATELET
Basophils Absolute: 0.1 10*3/uL (ref 0.0–0.1)
Basophils Relative: 1 % (ref 0–1)
EOS ABS: 0.5 10*3/uL (ref 0.0–0.7)
Eosinophils Relative: 5 % (ref 0–5)
HCT: 41.4 % (ref 36.0–46.0)
Hemoglobin: 13.9 g/dL (ref 12.0–15.0)
LYMPHS PCT: 30 % (ref 12–46)
Lymphs Abs: 2.9 10*3/uL (ref 0.7–4.0)
MCH: 30.7 pg (ref 26.0–34.0)
MCHC: 33.6 g/dL (ref 30.0–36.0)
MCV: 91.4 fL (ref 78.0–100.0)
MPV: 12.2 fL (ref 8.6–12.4)
Monocytes Absolute: 0.8 10*3/uL (ref 0.1–1.0)
Monocytes Relative: 8 % (ref 3–12)
Neutro Abs: 5.4 10*3/uL (ref 1.7–7.7)
Neutrophils Relative %: 56 % (ref 43–77)
Platelets: 216 10*3/uL (ref 150–400)
RBC: 4.53 MIL/uL (ref 3.87–5.11)
RDW: 13.9 % (ref 11.5–15.5)
WBC: 9.6 10*3/uL (ref 4.0–10.5)

## 2015-10-23 LAB — TSH: TSH: 2.383 u[IU]/mL (ref 0.350–4.500)

## 2015-10-23 MED ORDER — CITALOPRAM HYDROBROMIDE 40 MG PO TABS
20.0000 mg | ORAL_TABLET | Freq: Two times a day (BID) | ORAL | Status: DC
Start: 2015-10-23 — End: 2019-11-28

## 2015-10-23 MED ORDER — TOLTERODINE TARTRATE ER 4 MG PO CP24: 4.0000 mg | ORAL_CAPSULE | Freq: Every day | ORAL | Status: DC

## 2015-10-23 NOTE — Patient Instructions (Signed)
Thank you for coming to see me today. It was a pleasure. Today we talked about:   Incontinence: I think this is a combination of weak pelvic muscles and your bladder being a little overactive. We discussed trying Detrol to see if this will help your symptoms. I gave you a 30 days supply. I am including some information on this drug, which includes side effects.  Fatigue: I will get some lab work today  Depression: I am refilling your Citalopram today  Blood pressure: your blood pressure was good the second time we checked. No changes from me.  Please make an appointment to see me in 3 months for follow-up .  If you have any questions or concerns, please do not hesitate to call the office at [phone].  Sincerely,  [name], MD

## 2015-10-23 NOTE — Progress Notes (Signed)
    Subjective    Martha Mora is a 69 y.o. female that presents for a follow-up visit for chronic issues.   1. Fatigue: She states that sometimes during the day, she gets drowsy. She does not fall asleep. She uses a CPAP machine nightly. She reports getting about 8 hours of sleep per night broken up by a few episodes of awaking where she might watch about 45 minutes of TV. No change in activity levels.  2. Incontinence: She has leaking of urine that occurs when she is laughing of coughing. She states she has to keep her legs crossed to keep her urine. She has started to time her restroom stops. No hematuria.  3. Depression: She has been taking Celexa 40mg  and is requesting an increase to 60mg  daily. She has been stretching her Celexa 40mg  every 3 days. No suicidal ideation   4. Hypertension: she is adherent with amlodipine 5mg , carvedilol 3.125mg , losartan 100mg  daily.   Social History  Substance Use Topics  . Smoking status: Current Every Day Smoker -- 1.50 packs/day for 51 years    Types: Cigarettes  . Smokeless tobacco: Never Used  . Alcohol Use: No    Allergies  Allergen Reactions  . Scallops [Shellfish Allergy] Nausea And Vomiting  . Penicillins Other (See Comments)    Red bumps all over stomach    No orders of the defined types were placed in this encounter.    ROS  Per HPI   Objective   BP 150/64 mmHg  Pulse 67  Temp(Src) 98.4 F (36.9 C) (Oral)  Ht 5\' 2"  (1.575 m)  Wt 201 lb 12.8 oz (91.536 kg)  BMI 36.90 kg/m2  General: Well appearing HEENT: no thyroid enlargement  Assessment and Plan    Other fatigue Likely related to sleep hygiene. Will obtain labs, but honestly, I think sleep hygiene is key here. Unfortunately her back pain makes it difficult for her to stay asleep. Would prefer not to use hypnotics as patient is on CPAP; she voiced that she did not like Ambien, anyway.  Mixed incontinence Patient declines referral for pelvic rehab. Would rather try  medication management. Since she has symptoms suggesting urge, will treat with Detrol 4mg  daily and titrate down if good response.  Essential hypertension Controlled. No changes.  Depression Stable per patient. Refill citalopram 40mg  daily

## 2015-10-25 DIAGNOSIS — N3946 Mixed incontinence: Secondary | ICD-10-CM | POA: Insufficient documentation

## 2015-10-25 DIAGNOSIS — R5383 Other fatigue: Secondary | ICD-10-CM | POA: Insufficient documentation

## 2015-10-25 HISTORY — DX: Mixed incontinence: N39.46

## 2015-10-25 NOTE — Assessment & Plan Note (Signed)
Likely related to sleep hygiene. Will obtain labs, but honestly, I think sleep hygiene is key here. Unfortunately her back pain makes it difficult for her to stay asleep. Would prefer not to use hypnotics as patient is on CPAP; she voiced that she did not like Ambien, anyway.

## 2015-10-25 NOTE — Assessment & Plan Note (Signed)
Patient declines referral for pelvic rehab. Would rather try medication management. Since she has symptoms suggesting urge, will treat with Detrol 4mg  daily and titrate down if good response.

## 2015-10-25 NOTE — Assessment & Plan Note (Signed)
Controlled. No changes. 

## 2015-10-25 NOTE — Assessment & Plan Note (Signed)
Stable per patient. Refill citalopram 40mg  daily

## 2015-10-31 ENCOUNTER — Telehealth: Payer: Self-pay | Admitting: Family Medicine

## 2015-10-31 ENCOUNTER — Encounter: Payer: Self-pay | Admitting: Family Medicine

## 2015-10-31 NOTE — Telephone Encounter (Signed)
Please inform patient that labs look great. No concerns other than low protein which has been present and is stable. She should just increase her protein intake. I thought I sent a results letter but I seem to have forgotten. I will send a letter in the mail as well. Thanks!

## 2015-10-31 NOTE — Telephone Encounter (Signed)
LM for patient to call back.  Will try again tomorrow. Richa Shor,CMA

## 2015-10-31 NOTE — Telephone Encounter (Signed)
Will forward to Dr. Lonny Prude to review labs. Jazmin Hartsell,CMA

## 2015-10-31 NOTE — Telephone Encounter (Signed)
Pt called and would like to know what her lab results were. jw

## 2015-11-01 ENCOUNTER — Telehealth: Payer: Self-pay | Admitting: Family Medicine

## 2015-11-01 NOTE — Telephone Encounter (Signed)
Pt reports that she was given rOPINIRole (REQUIP) 2 MG tablet, about 2 or 3 months for RLS. She states that now she has the same feeling in her legs as well as all over her body. She forgot to mention this at her last visit. Please advise at earliest convenience. Thank you, Fonda Kinder, ASA

## 2015-11-03 ENCOUNTER — Telehealth: Payer: Self-pay | Admitting: Family Medicine

## 2015-11-03 ENCOUNTER — Encounter (HOSPITAL_COMMUNITY): Payer: Self-pay | Admitting: Emergency Medicine

## 2015-11-03 DIAGNOSIS — J449 Chronic obstructive pulmonary disease, unspecified: Secondary | ICD-10-CM | POA: Diagnosis not present

## 2015-11-03 DIAGNOSIS — I10 Essential (primary) hypertension: Secondary | ICD-10-CM | POA: Insufficient documentation

## 2015-11-03 DIAGNOSIS — Z791 Long term (current) use of non-steroidal anti-inflammatories (NSAID): Secondary | ICD-10-CM | POA: Insufficient documentation

## 2015-11-03 DIAGNOSIS — F17210 Nicotine dependence, cigarettes, uncomplicated: Secondary | ICD-10-CM | POA: Diagnosis not present

## 2015-11-03 DIAGNOSIS — R1084 Generalized abdominal pain: Secondary | ICD-10-CM | POA: Diagnosis not present

## 2015-11-03 DIAGNOSIS — Z853 Personal history of malignant neoplasm of breast: Secondary | ICD-10-CM | POA: Insufficient documentation

## 2015-11-03 DIAGNOSIS — E669 Obesity, unspecified: Secondary | ICD-10-CM | POA: Insufficient documentation

## 2015-11-03 DIAGNOSIS — F329 Major depressive disorder, single episode, unspecified: Secondary | ICD-10-CM | POA: Diagnosis not present

## 2015-11-03 DIAGNOSIS — K219 Gastro-esophageal reflux disease without esophagitis: Secondary | ICD-10-CM | POA: Diagnosis not present

## 2015-11-03 DIAGNOSIS — Z79899 Other long term (current) drug therapy: Secondary | ICD-10-CM | POA: Insufficient documentation

## 2015-11-03 DIAGNOSIS — E785 Hyperlipidemia, unspecified: Secondary | ICD-10-CM | POA: Diagnosis not present

## 2015-11-03 DIAGNOSIS — I2510 Atherosclerotic heart disease of native coronary artery without angina pectoris: Secondary | ICD-10-CM | POA: Insufficient documentation

## 2015-11-03 DIAGNOSIS — R011 Cardiac murmur, unspecified: Secondary | ICD-10-CM | POA: Diagnosis not present

## 2015-11-03 DIAGNOSIS — I252 Old myocardial infarction: Secondary | ICD-10-CM | POA: Insufficient documentation

## 2015-11-03 DIAGNOSIS — Z9861 Coronary angioplasty status: Secondary | ICD-10-CM | POA: Diagnosis not present

## 2015-11-03 DIAGNOSIS — Z880 Allergy status to penicillin: Secondary | ICD-10-CM | POA: Insufficient documentation

## 2015-11-03 DIAGNOSIS — M1990 Unspecified osteoarthritis, unspecified site: Secondary | ICD-10-CM | POA: Diagnosis not present

## 2015-11-03 DIAGNOSIS — Z7982 Long term (current) use of aspirin: Secondary | ICD-10-CM | POA: Diagnosis not present

## 2015-11-03 DIAGNOSIS — F419 Anxiety disorder, unspecified: Secondary | ICD-10-CM | POA: Insufficient documentation

## 2015-11-03 DIAGNOSIS — Z9889 Other specified postprocedural states: Secondary | ICD-10-CM | POA: Insufficient documentation

## 2015-11-03 DIAGNOSIS — Z86010 Personal history of colonic polyps: Secondary | ICD-10-CM | POA: Insufficient documentation

## 2015-11-03 DIAGNOSIS — Z9089 Acquired absence of other organs: Secondary | ICD-10-CM | POA: Diagnosis not present

## 2015-11-03 LAB — URINALYSIS, ROUTINE W REFLEX MICROSCOPIC
Bilirubin Urine: NEGATIVE
GLUCOSE, UA: NEGATIVE mg/dL
KETONES UR: NEGATIVE mg/dL
Nitrite: NEGATIVE
PH: 7.5 (ref 5.0–8.0)
Protein, ur: NEGATIVE mg/dL
Specific Gravity, Urine: 1.022 (ref 1.005–1.030)

## 2015-11-03 LAB — COMPREHENSIVE METABOLIC PANEL
ALBUMIN: 3.5 g/dL (ref 3.5–5.0)
ALT: 15 U/L (ref 14–54)
AST: 21 U/L (ref 15–41)
Alkaline Phosphatase: 56 U/L (ref 38–126)
Anion gap: 6 (ref 5–15)
BUN: 16 mg/dL (ref 6–20)
CHLORIDE: 110 mmol/L (ref 101–111)
CO2: 24 mmol/L (ref 22–32)
Calcium: 8.5 mg/dL — ABNORMAL LOW (ref 8.9–10.3)
Creatinine, Ser: 0.79 mg/dL (ref 0.44–1.00)
GFR calc Af Amer: >60 mL/min (ref >60)
GLUCOSE: 135 mg/dL — AB (ref 65–99)
POTASSIUM: 4.1 mmol/L (ref 3.5–5.1)
Sodium: 140 mmol/L (ref 135–145)
Total Bilirubin: 0.2 mg/dL — ABNORMAL LOW (ref 0.3–1.2)
Total Protein: 5.9 g/dL — ABNORMAL LOW (ref 6.5–8.1)

## 2015-11-03 LAB — CBC
HEMATOCRIT: 41.6 % (ref 36.0–46.0)
Hemoglobin: 13.5 g/dL (ref 12.0–15.0)
MCH: 30.3 pg (ref 26.0–34.0)
MCHC: 32.5 g/dL (ref 30.0–36.0)
MCV: 93.5 fL (ref 78.0–100.0)
Platelets: 230 10*3/uL (ref 150–400)
RBC: 4.45 MIL/uL (ref 3.87–5.11)
RDW: 13.7 % (ref 11.5–15.5)
WBC: 14.1 10*3/uL — AB (ref 4.0–10.5)

## 2015-11-03 LAB — URINE MICROSCOPIC-ADD ON

## 2015-11-03 LAB — LIPASE, BLOOD: LIPASE: 32 U/L (ref 11–51)

## 2015-11-03 NOTE — Telephone Encounter (Signed)
Pt called because her RLS is still bothering her and she know her dosage was just increased. She said she might have to increase this one more time. She also takes Trazodone for sleep and she is wondering if the two medications are not working together. She sometimes take a Tramadol too. Please call her to discuss. jw

## 2015-11-03 NOTE — ED Notes (Signed)
Pt. arrived with EMS from home reports generalized abdominal pain with nausea onset this evening after taking her medication for restless leg syndrome , denies diarrhea , no fever or chills.

## 2015-11-04 ENCOUNTER — Emergency Department (HOSPITAL_COMMUNITY)
Admission: EM | Admit: 2015-11-04 | Discharge: 2015-11-04 | Disposition: A | Discharge status: Home / Self Care | Payer: Commercial Managed Care - HMO | Attending: Emergency Medicine | Admitting: Emergency Medicine

## 2015-11-04 DIAGNOSIS — R1084 Generalized abdominal pain: Secondary | ICD-10-CM

## 2015-11-04 HISTORY — DX: Restless legs syndrome: G25.81

## 2015-11-04 MED ORDER — RANITIDINE HCL 150 MG/10ML PO SYRP
300.0000 mg | ORAL_SOLUTION | Freq: Once | ORAL | Status: AC
  Administered 2015-11-04: 300 mg via ORAL
  Filled 2015-11-04: qty 20

## 2015-11-04 MED ORDER — SUCRALFATE 1 GM/10ML PO SUSP
1.0000 g | Freq: Once | ORAL | Status: AC
  Administered 2015-11-04: 1 g via ORAL
  Filled 2015-11-04: qty 10

## 2015-11-04 NOTE — Discharge Instructions (Signed)
Abdominal Pain, Adult Ms. Hitchcock, do not take more than your normal medications daily, this likely caused your abdominal pain.  See your primary doctor within 3 days for close follow up.  If symptoms worsen, come back to the ED immediately. Thank you. Many things can cause belly (abdominal) pain. Most times, the belly pain is not dangerous. Many cases of belly pain can be watched and treated at home. HOME CARE   Do not take medicines that help you go poop (laxatives) unless told to by your doctor.  Only take medicine as told by your doctor.  Eat or drink as told by your doctor. Your doctor will tell you if you should be on a special diet. GET HELP IF:  You do not know what is causing your belly pain.  You have belly pain while you are sick to your stomach (nauseous) or have runny poop (diarrhea).  You have pain while you pee or poop.  Your belly pain wakes you up at night.  You have belly pain that gets worse or better when you eat.  You have belly pain that gets worse when you eat fatty foods.  You have a fever. GET HELP RIGHT AWAY IF:   The pain does not go away within 2 hours.  You keep throwing up (vomiting).  The pain changes and is only in the right or left part of the belly.  You have bloody or tarry looking poop. MAKE SURE YOU:   Understand these instructions.  Will watch your condition.  Will get help right away if you are not doing well or get worse.   This information is not intended to replace advice given to you by your health care provider. Make sure you discuss any questions you have with your health care provider.   Document Released: 04/22/2008 Document Revised: 11/25/2014 Document Reviewed: 07/14/2013 Elsevier Interactive Patient Education [employer].
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
## 2015-11-18 ENCOUNTER — Other Ambulatory Visit: Payer: Self-pay | Admitting: Hematology

## 2015-11-18 DIAGNOSIS — C50911 Malignant neoplasm of unspecified site of right female breast: Secondary | ICD-10-CM

## 2015-11-22 ENCOUNTER — Other Ambulatory Visit: Payer: Self-pay | Admitting: *Deleted

## 2015-11-22 DIAGNOSIS — N3946 Mixed incontinence: Secondary | ICD-10-CM

## 2015-11-22 MED ORDER — TOLTERODINE TARTRATE ER 4 MG PO CP24: 4.0000 mg | ORAL_CAPSULE | Freq: Every day | ORAL | Status: DC

## 2015-11-28 ENCOUNTER — Telehealth: Payer: Self-pay | Admitting: Family Medicine

## 2015-11-28 DIAGNOSIS — N3946 Mixed incontinence: Secondary | ICD-10-CM

## 2015-11-28 NOTE — Telephone Encounter (Signed)
Will forward to Dr. Nettey. Jazmin Hartsell,CMA  

## 2015-11-28 NOTE — Telephone Encounter (Signed)
When pt tried to refill her tolterodine, it was over $103. She was wondering if there was a cheaper option

## 2015-11-30 MED ORDER — SOLIFENACIN SUCCINATE 5 MG PO TABS
5.0000 mg | ORAL_TABLET | Freq: Every day | ORAL | Status: DC
Start: 2015-11-30 — End: 2016-02-01

## 2015-11-30 NOTE — Telephone Encounter (Signed)
Pt called again about her medication. °

## 2015-11-30 NOTE — Telephone Encounter (Signed)
Discussed with patient that I will switch to Vesicare 5mg  daily and discontinue Detrol LA. Patient reports she was doing very well with Detrol LA, however, which is unfortunate. Considered Myrbetric, however, concerned about patient's hypertension. Will need to watch for cognitive impairements while on this regimen.  Meds ordered this encounter  Medications  . solifenacin (VESICARE) 5 MG tablet    Sig: Take 1 tablet (5 mg total) by mouth daily.    Dispense:  30 tablet    Refill:  11   [name], MD PGY-3, [facility] 11/30/2015, 6:02 PM

## 2015-12-12 ENCOUNTER — Encounter: Payer: Self-pay | Admitting: Family Medicine

## 2015-12-12 LAB — HEMOGLOBIN A1C: Hemoglobin A1C: 5.3 %

## 2015-12-12 NOTE — Progress Notes (Signed)
Received results from recent health fair patient attended. Significant for an abdominal aorta diameter of 1.52cm, ABI of 1.15 right and 1.16 left.

## 2016-01-02 DIAGNOSIS — G4733 Obstructive sleep apnea (adult) (pediatric): Secondary | ICD-10-CM | POA: Diagnosis not present

## 2016-01-09 ENCOUNTER — Other Ambulatory Visit: Payer: Self-pay | Admitting: Family Medicine

## 2016-01-09 ENCOUNTER — Other Ambulatory Visit: Payer: Self-pay | Admitting: *Deleted

## 2016-01-09 NOTE — Telephone Encounter (Signed)
This is your patient, see med refill 

## 2016-01-11 ENCOUNTER — Other Ambulatory Visit: Payer: Self-pay | Admitting: *Deleted

## 2016-01-11 MED ORDER — TRAZODONE HCL 100 MG PO TABS: 100.0000 mg | ORAL_TABLET | Freq: Every evening | ORAL | Status: DC | PRN

## 2016-01-17 ENCOUNTER — Telehealth: Payer: Self-pay | Admitting: Hematology

## 2016-01-17 NOTE — Telephone Encounter (Signed)
Left message in regards to resch. appt time and date. Left call back number

## 2016-01-18 ENCOUNTER — Other Ambulatory Visit: Payer: Commercial Managed Care - HMO

## 2016-01-18 ENCOUNTER — Ambulatory Visit: Payer: Commercial Managed Care - HMO | Admitting: Hematology

## 2016-01-18 ENCOUNTER — Ambulatory Visit: Payer: Commercial Managed Care - HMO

## 2016-01-30 DIAGNOSIS — G4733 Obstructive sleep apnea (adult) (pediatric): Secondary | ICD-10-CM | POA: Diagnosis not present

## 2016-01-31 ENCOUNTER — Other Ambulatory Visit: Payer: Self-pay | Admitting: *Deleted

## 2016-01-31 DIAGNOSIS — C50921 Malignant neoplasm of unspecified site of right male breast: Secondary | ICD-10-CM

## 2016-02-01 ENCOUNTER — Ambulatory Visit (HOSPITAL_BASED_OUTPATIENT_CLINIC_OR_DEPARTMENT_OTHER): Payer: Medicare Other | Admitting: Hematology

## 2016-02-01 ENCOUNTER — Other Ambulatory Visit (HOSPITAL_BASED_OUTPATIENT_CLINIC_OR_DEPARTMENT_OTHER): Payer: Medicare Other

## 2016-02-01 ENCOUNTER — Telehealth: Payer: Self-pay | Admitting: Hematology

## 2016-02-01 ENCOUNTER — Ambulatory Visit (HOSPITAL_BASED_OUTPATIENT_CLINIC_OR_DEPARTMENT_OTHER): Payer: Medicare Other

## 2016-02-01 ENCOUNTER — Encounter: Payer: Self-pay | Admitting: Hematology

## 2016-02-01 ENCOUNTER — Other Ambulatory Visit: Payer: Self-pay | Admitting: Hematology

## 2016-02-01 VITALS — BP 148/48 | HR 73 | Temp 97.8°F | Resp 18 | Ht 62.0 in | Wt 200.8 lb

## 2016-02-01 DIAGNOSIS — C50911 Malignant neoplasm of unspecified site of right female breast: Secondary | ICD-10-CM | POA: Diagnosis not present

## 2016-02-01 DIAGNOSIS — I10 Essential (primary) hypertension: Secondary | ICD-10-CM | POA: Diagnosis not present

## 2016-02-01 DIAGNOSIS — M545 Low back pain: Secondary | ICD-10-CM

## 2016-02-01 DIAGNOSIS — C50921 Malignant neoplasm of unspecified site of right male breast: Secondary | ICD-10-CM

## 2016-02-01 DIAGNOSIS — M1990 Unspecified osteoarthritis, unspecified site: Secondary | ICD-10-CM

## 2016-02-01 DIAGNOSIS — M8580 Other specified disorders of bone density and structure, unspecified site: Secondary | ICD-10-CM

## 2016-02-01 DIAGNOSIS — Z1231 Encounter for screening mammogram for malignant neoplasm of breast: Secondary | ICD-10-CM

## 2016-02-01 LAB — COMPREHENSIVE METABOLIC PANEL
ALBUMIN: 3 g/dL — AB (ref 3.5–5.0)
ALK PHOS: 53 U/L (ref 40–150)
ALT: 12 U/L (ref 0–55)
AST: 22 U/L (ref 5–34)
Anion Gap: 7 mEq/L (ref 3–11)
BUN: 15.8 mg/dL (ref 7.0–26.0)
CALCIUM: 8.7 mg/dL (ref 8.4–10.4)
CO2: 24 mEq/L (ref 22–29)
Chloride: 110 mEq/L — ABNORMAL HIGH (ref 98–109)
Creatinine: 0.8 mg/dL (ref 0.6–1.1)
EGFR: 71 mL/min/{1.73_m2} — AB (ref >90)
GLUCOSE: 143 mg/dL — AB (ref 70–140)
POTASSIUM: 4.1 meq/L (ref 3.5–5.1)
SODIUM: 141 meq/L (ref 136–145)
Total Bilirubin: <0.3 mg/dL (ref 0.20–1.20)
Total Protein: 5.7 g/dL — ABNORMAL LOW (ref 6.4–8.3)

## 2016-02-01 LAB — CBC WITH DIFFERENTIAL/PLATELET
BASO%: 0.9 % (ref 0.0–2.0)
BASOS ABS: 0.1 10*3/uL (ref 0.0–0.1)
EOS%: 3 % (ref 0.0–7.0)
Eosinophils Absolute: 0.3 10*3/uL (ref 0.0–0.5)
HCT: 42.1 % (ref 34.8–46.6)
HEMOGLOBIN: 13.6 g/dL (ref 11.6–15.9)
LYMPH%: 19.7 % (ref 14.0–49.7)
MCH: 29.7 pg (ref 25.1–34.0)
MCHC: 32.4 g/dL (ref 31.5–36.0)
MCV: 91.7 fL (ref 79.5–101.0)
MONO#: 0.7 10*3/uL (ref 0.1–0.9)
MONO%: 6.1 % (ref 0.0–14.0)
NEUT#: 7.9 10*3/uL — ABNORMAL HIGH (ref 1.5–6.5)
NEUT%: 70.3 % (ref 38.4–76.8)
Platelets: 225 10*3/uL (ref 145–400)
RBC: 4.59 10*6/uL (ref 3.70–5.45)
RDW: 14 % (ref 11.2–14.5)
WBC: 11.2 10*3/uL — ABNORMAL HIGH (ref 3.9–10.3)
lymph#: 2.2 10*3/uL (ref 0.9–3.3)

## 2016-02-01 MED ORDER — DENOSUMAB 60 MG/ML ~~LOC~~ SOLN
60.0000 mg | Freq: Once | SUBCUTANEOUS | Status: AC
  Administered 2016-02-01: 60 mg via SUBCUTANEOUS
  Filled 2016-02-01: qty 1

## 2016-02-01 MED ORDER — TAMOXIFEN CITRATE 20 MG PO TABS: 20.0000 mg | ORAL_TABLET | Freq: Every day | ORAL | Status: DC
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
## 2016-02-01 NOTE — Telephone Encounter (Signed)
per pof to sch pt appt-sch mamma & DEXA-gave pt copy of avs

## 2016-02-01 NOTE — Patient Instructions (Signed)
Denosumab injection  What is this medicine?  DENOSUMAB (den oh sue mab) slows bone breakdown. Prolia is used to treat osteoporosis in women after menopause and in men. Xgeva is used to prevent bone fractures and other bone problems caused by cancer bone metastases. Xgeva is also used to treat giant cell tumor of the bone.  This medicine may be used for other purposes; ask your health care provider or pharmacist if you have questions.  What should I tell my health care provider before I take this medicine?  They need to know if you have any of these conditions:  -dental disease  -eczema  -infection or history of infections  -kidney disease or on dialysis  -low blood calcium or vitamin D  -malabsorption syndrome  -scheduled to have surgery or tooth extraction  -taking medicine that contains denosumab  -thyroid or parathyroid disease  -an unusual reaction to denosumab, other medicines, foods, dyes, or preservatives  -pregnant or trying to get pregnant  -breast-feeding  How should I use this medicine?  This medicine is for injection under the skin. It is given by a health care professional in a hospital or clinic setting.  If you are getting Prolia, a special MedGuide will be given to you by the pharmacist with each prescription and refill. Be sure to read this information carefully each time.  For Prolia, talk to your pediatrician regarding the use of this medicine in children. Special care may be needed. For Xgeva, talk to your pediatrician regarding the use of this medicine in children. While this drug may be prescribed for children as young as 13 years for selected conditions, precautions do apply.  Overdosage: If you think you have taken too much of this medicine contact a poison control center or emergency room at once.  NOTE: This medicine is only for you. Do not share this medicine with others.  What if I miss a dose?  It is important not to miss your dose. Call your doctor or health care professional if you are  unable to keep an appointment.  What may interact with this medicine?  Do not take this medicine with any of the following medications:  -other medicines containing denosumab  This medicine may also interact with the following medications:  -medicines that suppress the immune system  -medicines that treat cancer  -steroid medicines like prednisone or cortisone  This list may not describe all possible interactions. Give your health care provider a list of all the medicines, herbs, non-prescription drugs, or dietary supplements you use. Also tell them if you smoke, drink alcohol, or use illegal drugs. Some items may interact with your medicine.  What should I watch for while using this medicine?  Visit your doctor or health care professional for regular checks on your progress. Your doctor or health care professional may order blood tests and other tests to see how you are doing.  Call your doctor or health care professional if you get a cold or other infection while receiving this medicine. Do not treat yourself. This medicine may decrease your body's ability to fight infection.  You should make sure you get enough calcium and vitamin D while you are taking this medicine, unless your doctor tells you not to. Discuss the foods you eat and the vitamins you take with your health care professional.  See your dentist regularly. Brush and floss your teeth as directed. Before you have any dental work done, tell your dentist you are receiving this medicine.  Do   not become pregnant while taking this medicine or for 5 months after stopping it. Women should inform their doctor if they wish to become pregnant or think they might be pregnant. There is a potential for serious side effects to an unborn child. Talk to your health care professional or pharmacist for more information.  What side effects may I notice from receiving this medicine?  Side effects that you should report to your doctor or health care professional as soon as  possible:  -allergic reactions like skin rash, itching or hives, swelling of the face, lips, or tongue  -breathing problems  -chest pain  -fast, irregular heartbeat  -feeling faint or lightheaded, falls  -fever, chills, or any other sign of infection  -muscle spasms, tightening, or twitches  -numbness or tingling  -skin blisters or bumps, or is dry, peels, or red  -slow healing or unexplained pain in the mouth or jaw  -unusual bleeding or bruising  Side effects that usually do not require medical attention (Report these to your doctor or health care professional if they continue or are bothersome.):  -muscle pain  -stomach upset, gas  This list may not describe all possible side effects. Call your doctor for medical advice about side effects. You may report side effects to FDA at 1-800-FDA-1088.  Where should I keep my medicine?  This medicine is only given in a clinic, doctor's office, or other health care setting and will not be stored at home.  NOTE: This sheet is a summary. It may not cover all possible information. If you have questions about this medicine, talk to your doctor, pharmacist, or health care provider.      2016, Elsevier/Gold Standard. (2012-05-04 12:37:47)

## 2016-02-22 ENCOUNTER — Inpatient Hospital Stay: Admission: RE | Admit: 2016-02-22 | Payer: Medicare Other | Source: Ambulatory Visit

## 2016-03-01 DIAGNOSIS — G4733 Obstructive sleep apnea (adult) (pediatric): Secondary | ICD-10-CM | POA: Diagnosis not present

## 2016-03-13 DIAGNOSIS — J449 Chronic obstructive pulmonary disease, unspecified: Secondary | ICD-10-CM | POA: Diagnosis not present

## 2016-03-13 DIAGNOSIS — I10 Essential (primary) hypertension: Secondary | ICD-10-CM | POA: Diagnosis not present

## 2016-03-13 DIAGNOSIS — I2510 Atherosclerotic heart disease of native coronary artery without angina pectoris: Secondary | ICD-10-CM | POA: Diagnosis not present

## 2016-03-31 DIAGNOSIS — G4733 Obstructive sleep apnea (adult) (pediatric): Secondary | ICD-10-CM | POA: Diagnosis not present

## 2016-05-01 DIAGNOSIS — G4733 Obstructive sleep apnea (adult) (pediatric): Secondary | ICD-10-CM | POA: Diagnosis not present

## 2016-05-02 ENCOUNTER — Other Ambulatory Visit: Payer: Self-pay | Admitting: *Deleted

## 2016-05-02 MED ORDER — TRAZODONE HCL 100 MG PO TABS: 100.0000 mg | ORAL_TABLET | Freq: Every evening | ORAL | Status: DC | PRN

## 2016-05-02 NOTE — Telephone Encounter (Signed)
30 day supply given. Patient needs an appointment for further refills.  Algis Greenhouse. Jerline Pain, [place] Resident PGY-2 05/02/2016 11:58 AM

## 2016-05-15 ENCOUNTER — Ambulatory Visit (INDEPENDENT_AMBULATORY_CARE_PROVIDER_SITE_OTHER): Payer: Medicare Other | Admitting: Family Medicine

## 2016-05-15 ENCOUNTER — Telehealth: Payer: Self-pay | Admitting: Family Medicine

## 2016-05-15 VITALS — BP 139/61 | HR 61 | Temp 99.5°F | Wt 204.2 lb

## 2016-05-15 DIAGNOSIS — R0982 Postnasal drip: Secondary | ICD-10-CM | POA: Diagnosis not present

## 2016-05-15 DIAGNOSIS — I10 Essential (primary) hypertension: Secondary | ICD-10-CM

## 2016-05-15 DIAGNOSIS — R0600 Dyspnea, unspecified: Secondary | ICD-10-CM | POA: Diagnosis not present

## 2016-05-15 DIAGNOSIS — N3946 Mixed incontinence: Secondary | ICD-10-CM | POA: Diagnosis not present

## 2016-05-15 MED ORDER — FLUTICASONE PROPIONATE 50 MCG/ACT NA SUSP: 2.0000 | Freq: Every day | NASAL | Status: DC

## 2016-05-15 MED ORDER — TOLTERODINE TARTRATE ER 2 MG PO CP24
2.0000 mg | ORAL_CAPSULE | Freq: Every day | ORAL | Status: DC
Start: 2016-05-15 — End: 2016-05-16

## 2016-05-15 NOTE — Telephone Encounter (Signed)
Pt called because her Detrol LA is 95.00 and she can not afford this. Can we call in something cheaper that she can afford. jw

## 2016-05-15 NOTE — Progress Notes (Signed)
    Subjective    Martha Mora is a 70 y.o. female that presents for a follow-up visit for:   1. Dyspnea: Symptoms started about one year ago. Symptoms have been progressively worsening. She does not have symptoms of PND and sleeps on two pillows for her back pain. No decreased exercise tolerance. Patient has a history of EF of 55-65% from an echo in 2003. She has a history of MI with multiple stent placement. No chest pain. She is followed by cardiology and states she has had a recent echo within the year.  2. Hypertension: Patient adherent with amlodipine 5mg  qd and coreg 3.125mg  bid. No chest pain or dyspnea. No side effects from medications.  3. Urinary incontinence: This is a chronic issue. Patient has urgency in addition to stress symptoms. She states that Detrol worked well for her last year before she was unable to afford it. She reports Vesicare did not work well. She would like to go back to [employer].  Social History  Substance Use Topics  . Smoking status: Current Every Day Smoker -- 1.50 packs/day for 51 years    Types: Cigarettes  . Smokeless tobacco: Never Used  . Alcohol Use: No    Allergies  Allergen Reactions  . Scallops [Shellfish Allergy] Nausea And Vomiting  . Penicillins Other (See Comments)    Red bumps all over stomach    No orders of the defined types were placed in this encounter.    ROS  Per HPI   Objective   BP 139/61 mmHg  Pulse 61  Temp(Src) 99.5 F (37.5 C) (Oral)  Wt 204 lb 3.2 oz (92.625 kg)  SpO2 97%  Vital signs reviewed  General: Well appearing, no distress Heent:   Head:  Normocephalic  Eyes: Pupils equal and reactive to light/accomodation. Extraocular movements intact bilaterally.  Ears: Tympanic membranes normal bilaterally.  Nose/Throat: Nares patent bilaterally with edematous turbinates. Oropharnx clear and moist.  Neck: No cervical adenopathy bilaterally Respiratory: Clear to auscultation bilaterally. Unlabored work of  breathing. No wheezing or rales. Cardiovascular: Regular rate and rhythm. Normal S1 and S2. No heart murmurs present. No extra heart sounds  Assessment and Plan    Mixed incontinence Patient would like to restart Detrol. Will prescribe Detrol LA 2mg  daily. Discussed precautions while on this medication, including confusion and falls.  Essential hypertension Controlled. No changes to regimen.  Dyspnea Patient appears well. Lungs are clear. Do not suspect fluid overload or pneumonia. Possibly related to being deconditioned. Will obtain chest x-ray as patient is concerned about possible return of her breast cancer. - DG Chest 2 View; Future  Post-nasal drip - fluticasone (FLONASE) 50 MCG/ACT nasal spray; Place 2 sprays into both nostrils daily.  Dispense: 16 g; Refill: 6

## 2016-05-15 NOTE — Patient Instructions (Signed)
Thank you for coming to see me today. It was a pleasure. Today we talked about:   Dyspnea: I cannot find anything on exam. I will order an x-ray for you to get today.  Hypertension: I'm rechecking her blood pressure before you leave. I will not make any changes to blood pressure medication today.  Incontinence: I'm restarting her Detrol. We discussed the side effects of this medication, including confusion and increased falls. If any of these symptoms occur, please discontinue his medication.  Postnasal drip: I am sending in a prescription for Flonase. This should help with your coughing and voice if it is caused by drainage from the nose.  Please make an appointment to see your new doctor in 3 months or sooner if symptoms do not improve.  If you have any questions or concerns, please do not hesitate to call the office at [phone].  Sincerely,  [name], MD

## 2016-05-16 ENCOUNTER — Encounter: Payer: Self-pay | Admitting: Family Medicine

## 2016-05-16 NOTE — Assessment & Plan Note (Signed)
Patient would like to restart Detrol. Will prescribe Detrol LA 2mg  daily. Discussed precautions while on this medication, including confusion and falls.

## 2016-05-16 NOTE — Assessment & Plan Note (Signed)
Controlled. No changes to regimen.

## 2016-05-17 ENCOUNTER — Other Ambulatory Visit: Payer: Self-pay | Admitting: Family Medicine

## 2016-05-17 ENCOUNTER — Ambulatory Visit
Admission: RE | Admit: 2016-05-17 | Discharge: 2016-05-17 | Disposition: A | Discharge status: Home / Self Care | Payer: Medicare Other | Source: Ambulatory Visit | Attending: Family Medicine | Admitting: Family Medicine

## 2016-05-17 DIAGNOSIS — R0789 Other chest pain: Secondary | ICD-10-CM | POA: Diagnosis not present

## 2016-05-17 DIAGNOSIS — J449 Chronic obstructive pulmonary disease, unspecified: Secondary | ICD-10-CM

## 2016-05-17 DIAGNOSIS — R05 Cough: Secondary | ICD-10-CM | POA: Diagnosis not present

## 2016-05-17 MED ORDER — MIRABEGRON ER 25 MG PO TB24: 25.0000 mg | ORAL_TABLET | Freq: Every day | ORAL | Status: DC

## 2016-05-17 NOTE — Telephone Encounter (Signed)
Will call in Myrbetriq. This is the medication that her and I discussed that can affect her blood pressure. Please have her schedule a follow-up visit in one month for reevaluation while on this medication. Thanks.

## 2016-05-22 ENCOUNTER — Encounter: Payer: Self-pay | Admitting: Family Medicine

## 2016-05-22 ENCOUNTER — Telehealth: Payer: Self-pay | Admitting: *Deleted

## 2016-05-22 DIAGNOSIS — H3562 Retinal hemorrhage, left eye: Secondary | ICD-10-CM | POA: Diagnosis not present

## 2016-05-22 DIAGNOSIS — H35032 Hypertensive retinopathy, left eye: Secondary | ICD-10-CM | POA: Diagnosis not present

## 2016-05-22 DIAGNOSIS — H353130 Nonexudative age-related macular degeneration, bilateral, stage unspecified: Secondary | ICD-10-CM | POA: Diagnosis not present

## 2016-05-22 DIAGNOSIS — H2513 Age-related nuclear cataract, bilateral: Secondary | ICD-10-CM | POA: Diagnosis not present

## 2016-05-22 DIAGNOSIS — H35031 Hypertensive retinopathy, right eye: Secondary | ICD-10-CM | POA: Diagnosis not present

## 2016-05-22 NOTE — Telephone Encounter (Signed)
-----   Message from [name], MD sent at 05/17/2016  2:15 PM EDT ----- Please inform patient that her x-rays did not show a pneumonia. Nothing has changes since her previous x-ray. Thanks!

## 2016-05-22 NOTE — Telephone Encounter (Signed)
Tried to contact pt to inform her of below and line was busy. Will try back later. Martha Mora, Martha Mora D, [name]

## 2016-05-22 NOTE — Telephone Encounter (Signed)
Pt called back and was informed on the message below and understood. jw

## 2016-05-23 ENCOUNTER — Encounter (HOSPITAL_COMMUNITY): Payer: Self-pay | Admitting: *Deleted

## 2016-05-23 ENCOUNTER — Inpatient Hospital Stay (HOSPITAL_COMMUNITY)
Admission: AD | Admit: 2016-05-23 | Discharge: 2016-05-25 | DRG: 247 | Disposition: A | Discharge status: Home / Self Care | Payer: Medicare Other | Source: Ambulatory Visit | Attending: Cardiovascular Disease | Admitting: Cardiovascular Disease

## 2016-05-23 DIAGNOSIS — Z79899 Other long term (current) drug therapy: Secondary | ICD-10-CM | POA: Diagnosis not present

## 2016-05-23 DIAGNOSIS — Z9861 Coronary angioplasty status: Secondary | ICD-10-CM | POA: Diagnosis not present

## 2016-05-23 DIAGNOSIS — Z955 Presence of coronary angioplasty implant and graft: Secondary | ICD-10-CM | POA: Diagnosis not present

## 2016-05-23 DIAGNOSIS — Z853 Personal history of malignant neoplasm of breast: Secondary | ICD-10-CM | POA: Diagnosis not present

## 2016-05-23 DIAGNOSIS — J449 Chronic obstructive pulmonary disease, unspecified: Secondary | ICD-10-CM | POA: Diagnosis present

## 2016-05-23 DIAGNOSIS — K219 Gastro-esophageal reflux disease without esophagitis: Secondary | ICD-10-CM | POA: Diagnosis present

## 2016-05-23 DIAGNOSIS — F17210 Nicotine dependence, cigarettes, uncomplicated: Secondary | ICD-10-CM | POA: Diagnosis present

## 2016-05-23 DIAGNOSIS — R079 Chest pain, unspecified: Secondary | ICD-10-CM | POA: Diagnosis present

## 2016-05-23 DIAGNOSIS — G4733 Obstructive sleep apnea (adult) (pediatric): Secondary | ICD-10-CM | POA: Diagnosis present

## 2016-05-23 DIAGNOSIS — G2581 Restless legs syndrome: Secondary | ICD-10-CM | POA: Diagnosis not present

## 2016-05-23 DIAGNOSIS — Z6837 Body mass index (BMI) 37.0-37.9, adult: Secondary | ICD-10-CM

## 2016-05-23 DIAGNOSIS — Z981 Arthrodesis status: Secondary | ICD-10-CM

## 2016-05-23 DIAGNOSIS — I25110 Atherosclerotic heart disease of native coronary artery with unstable angina pectoris: Secondary | ICD-10-CM | POA: Diagnosis not present

## 2016-05-23 DIAGNOSIS — E785 Hyperlipidemia, unspecified: Secondary | ICD-10-CM | POA: Diagnosis not present

## 2016-05-23 DIAGNOSIS — Z85828 Personal history of other malignant neoplasm of skin: Secondary | ICD-10-CM | POA: Diagnosis not present

## 2016-05-23 DIAGNOSIS — F329 Major depressive disorder, single episode, unspecified: Secondary | ICD-10-CM | POA: Diagnosis present

## 2016-05-23 DIAGNOSIS — R0602 Shortness of breath: Secondary | ICD-10-CM | POA: Diagnosis not present

## 2016-05-23 DIAGNOSIS — I10 Essential (primary) hypertension: Secondary | ICD-10-CM | POA: Diagnosis present

## 2016-05-23 DIAGNOSIS — Z7982 Long term (current) use of aspirin: Secondary | ICD-10-CM

## 2016-05-23 DIAGNOSIS — I252 Old myocardial infarction: Secondary | ICD-10-CM | POA: Diagnosis not present

## 2016-05-23 DIAGNOSIS — I249 Acute ischemic heart disease, unspecified: Secondary | ICD-10-CM | POA: Diagnosis present

## 2016-05-23 DIAGNOSIS — E669 Obesity, unspecified: Secondary | ICD-10-CM | POA: Diagnosis present

## 2016-05-23 DIAGNOSIS — F419 Anxiety disorder, unspecified: Secondary | ICD-10-CM | POA: Diagnosis present

## 2016-05-23 LAB — CBC WITH DIFFERENTIAL/PLATELET
BASOS PCT: 1 %
Basophils Absolute: 0.1 10*3/uL (ref 0.0–0.1)
EOS PCT: 5 %
Eosinophils Absolute: 0.7 10*3/uL (ref 0.0–0.7)
HCT: 41.2 % (ref 36.0–46.0)
HEMOGLOBIN: 13.5 g/dL (ref 12.0–15.0)
LYMPHS PCT: 30 %
Lymphs Abs: 3.9 10*3/uL (ref 0.7–4.0)
MCH: 30.9 pg (ref 26.0–34.0)
MCHC: 32.8 g/dL (ref 30.0–36.0)
MCV: 94.3 fL (ref 78.0–100.0)
MONO ABS: 0.7 10*3/uL (ref 0.1–1.0)
Monocytes Relative: 5 %
NEUTROS ABS: 7.7 10*3/uL (ref 1.7–7.7)
NEUTROS PCT: 59 %
Platelets: 233 10*3/uL (ref 150–400)
RBC: 4.37 MIL/uL (ref 3.87–5.11)
RDW: 14.3 % (ref 11.5–15.5)
WBC: 13.1 10*3/uL — ABNORMAL HIGH (ref 4.0–10.5)

## 2016-05-23 LAB — COMPREHENSIVE METABOLIC PANEL
ALBUMIN: 3 g/dL — AB (ref 3.5–5.0)
ALK PHOS: 48 U/L (ref 38–126)
ALT: 13 U/L — AB (ref 14–54)
AST: 21 U/L (ref 15–41)
Anion gap: 6 (ref 5–15)
BUN: 11 mg/dL (ref 6–20)
CHLORIDE: 108 mmol/L (ref 101–111)
CO2: 26 mmol/L (ref 22–32)
CREATININE: 0.81 mg/dL (ref 0.44–1.00)
Calcium: 8.8 mg/dL — ABNORMAL LOW (ref 8.9–10.3)
GFR calc non Af Amer: >60 mL/min (ref >60)
GLUCOSE: 93 mg/dL (ref 65–99)
Potassium: 3.9 mmol/L (ref 3.5–5.1)
SODIUM: 140 mmol/L (ref 135–145)
Total Bilirubin: 0.3 mg/dL (ref 0.3–1.2)
Total Protein: 5.6 g/dL — ABNORMAL LOW (ref 6.5–8.1)

## 2016-05-23 LAB — D-DIMER, QUANTITATIVE: D-Dimer, Quant: 0.51 ug/mL-FEU — ABNORMAL HIGH (ref 0.00–0.50)

## 2016-05-23 LAB — TROPONIN I

## 2016-05-23 MED ORDER — LOSARTAN POTASSIUM 50 MG PO TABS
100.0000 mg | ORAL_TABLET | Freq: Every day | ORAL | Status: DC
  Administered 2016-05-24 – 2016-05-25 (×2): 100 mg via ORAL
  Filled 2016-05-23 (×2): qty 2

## 2016-05-23 MED ORDER — TRAMADOL HCL 50 MG PO TABS
50.0000 mg | ORAL_TABLET | Freq: Two times a day (BID) | ORAL | Status: DC
  Administered 2016-05-23 – 2016-05-24 (×3): 50 mg via ORAL
  Filled 2016-05-23 (×3): qty 1

## 2016-05-23 MED ORDER — SODIUM CHLORIDE 0.9% FLUSH: 3.0000 mL | INTRAVENOUS | Status: DC | PRN

## 2016-05-23 MED ORDER — CARVEDILOL 3.125 MG PO TABS
3.1250 mg | ORAL_TABLET | Freq: Two times a day (BID) | ORAL | Status: DC
  Administered 2016-05-23 – 2016-05-25 (×4): 3.125 mg via ORAL
  Filled 2016-05-23 (×4): qty 1

## 2016-05-23 MED ORDER — SODIUM CHLORIDE 0.9% FLUSH
3.0000 mL | Freq: Two times a day (BID) | INTRAVENOUS | Status: DC
  Administered 2016-05-24: 3 mL via INTRAVENOUS

## 2016-05-23 MED ORDER — PANTOPRAZOLE SODIUM 40 MG PO TBEC
40.0000 mg | DELAYED_RELEASE_TABLET | Freq: Every day | ORAL | Status: DC
  Administered 2016-05-24 – 2016-05-25 (×2): 40 mg via ORAL
  Filled 2016-05-23 (×2): qty 1

## 2016-05-23 MED ORDER — HEPARIN BOLUS VIA INFUSION
4000.0000 [IU] | Freq: Once | INTRAVENOUS | Status: AC
  Administered 2016-05-23: 4000 [IU] via INTRAVENOUS
  Filled 2016-05-23: qty 4000

## 2016-05-23 MED ORDER — ONDANSETRON HCL 4 MG/2ML IJ SOLN
4.0000 mg | Freq: Four times a day (QID) | INTRAMUSCULAR | Status: DC | PRN
Start: 2016-05-23 — End: 2016-05-25

## 2016-05-23 MED ORDER — NITROGLYCERIN 0.4 MG SL SUBL: 0.4000 mg | SUBLINGUAL_TABLET | SUBLINGUAL | Status: DC | PRN

## 2016-05-23 MED ORDER — CALCIUM CARBONATE-VITAMIN D 600-400 MG-UNIT PO TABS: 1.0000 | ORAL_TABLET | Freq: Two times a day (BID) | ORAL | Status: DC

## 2016-05-23 MED ORDER — HEPARIN (PORCINE) IN NACL 100-0.45 UNIT/ML-% IJ SOLN
1250.0000 [IU]/h | INTRAMUSCULAR | Status: DC
  Administered 2016-05-23: 1100 [IU]/h via INTRAVENOUS
  Filled 2016-05-23: qty 250

## 2016-05-23 MED ORDER — ACETAMINOPHEN 325 MG PO TABS: 650.0000 mg | ORAL_TABLET | ORAL | Status: DC | PRN

## 2016-05-23 MED ORDER — CITALOPRAM HYDROBROMIDE 20 MG PO TABS
20.0000 mg | ORAL_TABLET | Freq: Two times a day (BID) | ORAL | Status: DC
  Administered 2016-05-23 – 2016-05-25 (×4): 20 mg via ORAL
  Filled 2016-05-23 (×4): qty 1

## 2016-05-23 MED ORDER — FLUTICASONE PROPIONATE 50 MCG/ACT NA SUSP
2.0000 | Freq: Every day | NASAL | Status: DC
  Filled 2016-05-23: qty 16

## 2016-05-23 MED ORDER — SODIUM CHLORIDE 0.9 % IV SOLN: 250.0000 mL | INTRAVENOUS | Status: DC | PRN

## 2016-05-23 MED ORDER — ASPIRIN 300 MG RE SUPP: 300.0000 mg | RECTAL | Status: AC

## 2016-05-23 MED ORDER — ASPIRIN 81 MG PO CHEW
324.0000 mg | CHEWABLE_TABLET | ORAL | Status: AC
  Administered 2016-05-24: 324 mg via ORAL
  Filled 2016-05-23: qty 4

## 2016-05-23 MED ORDER — ASPIRIN EC 81 MG PO TBEC
81.0000 mg | DELAYED_RELEASE_TABLET | Freq: Every day | ORAL | Status: DC
  Administered 2016-05-25: 81 mg via ORAL
  Filled 2016-05-23 (×2): qty 1

## 2016-05-23 MED ORDER — MIRABEGRON ER 25 MG PO TB24
25.0000 mg | ORAL_TABLET | Freq: Every day | ORAL | Status: DC
  Administered 2016-05-24 – 2016-05-25 (×2): 25 mg via ORAL
  Filled 2016-05-23 (×2): qty 1

## 2016-05-23 MED ORDER — TRAZODONE HCL 50 MG PO TABS
100.0000 mg | ORAL_TABLET | Freq: Every day | ORAL | Status: DC
  Administered 2016-05-23 – 2016-05-24 (×2): 100 mg via ORAL
  Filled 2016-05-23: qty 1
  Filled 2016-05-23: qty 2

## 2016-05-23 MED ORDER — PRAVASTATIN SODIUM 40 MG PO TABS
40.0000 mg | ORAL_TABLET | Freq: Every day | ORAL | Status: DC
  Administered 2016-05-24: 19:00:00 40 mg via ORAL
  Filled 2016-05-23: qty 1

## 2016-05-23 NOTE — Progress Notes (Signed)
Patient request trazadone for sleep, which is a home med on her profile, but not ordered here. Dr. [name] paged to request an order for trazadone. Ginette Otto
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
## 2016-05-23 NOTE — Progress Notes (Signed)
ANTICOAGULATION CONSULT NOTE - Initial Consult  Pharmacy Consult for Heparin Indication: chest pain/ACS  Allergies  Allergen Reactions  . Scallops [Shellfish Allergy] Nausea And Vomiting  . Penicillins Other (See Comments)    Red bumps all over stomach    Patient Measurements: Height: 5' 2.5" (158.8 cm) Weight: 202 lb 12.8 oz (91.989 kg) IBW/kg (Calculated) : 51.25 Heparin Dosing Weight:  74 kg  Vital Signs: Temp: 98.3 F (36.8 C) (07/06 1714) Temp Source: Oral (07/06 1714) BP: 148/49 mmHg (07/06 1714) Pulse Rate: 61 (07/06 1714)  Labs: No results for input(s): HGB, HCT, PLT, APTT, LABPROT, INR, HEPARINUNFRC, HEPRLOWMOCWT, CREATININE, CKTOTAL, CKMB, TROPONINI in the last 72 hours.  CrCl cannot be calculated (Patient has no serum creatinine result on file.).   Medical History: Past Medical History  Diagnosis Date  . Reflux esophagitis   . Duodenitis without hemorrhage   . Internal hemorrhoids   . Diverticulosis   . GERD (gastroesophageal reflux disease)   . Adenomatous colon polyp   . Depression   . Obesity   . Hyperlipemia   . Hypertension   . CAD (coronary artery disease)   . Family history of malignant neoplasm of gastrointestinal tract   . Osteopenia 12/27/2011  . Heart murmur   . Anginal pain ([place])   . Myocardial infarction ([place]) 1997  . COPD (chronic obstructive pulmonary disease) ([place])   . Chronic bronchitis ([place])   . OSA (obstructive sleep apnea)     "should wear machine; can't sleep w/it on" (09/30/12)  . Osteoarthritis     "back & hips mostly" (09/30/12)  . Anxiety   . Breast cancer ([place])   . Restless leg syndrome     Assessment: 70 year old female to begin heparin for chest pain  Goal of Therapy:  Heparin level 0.3-0.7 units/ml Monitor platelets by anticoagulation protocol: Yes   Plan:  Heparin 4000 units iv bolus x 1 Heparin 1100 units / hr Heparin level 6 hours after heparin starts Daily heparin level, CBC  Thank you Anette Guarneri,  PharmD [phone]  Tad Moore 05/23/2016,6:14 PM

## 2016-05-24 ENCOUNTER — Encounter (HOSPITAL_COMMUNITY): Payer: Self-pay | Admitting: *Deleted

## 2016-05-24 ENCOUNTER — Encounter (HOSPITAL_COMMUNITY)
Admission: AD | Disposition: A | Discharge status: Home / Self Care | Payer: Self-pay | Source: Ambulatory Visit | Attending: Cardiovascular Disease

## 2016-05-24 DIAGNOSIS — Z85828 Personal history of other malignant neoplasm of skin: Secondary | ICD-10-CM | POA: Diagnosis not present

## 2016-05-24 DIAGNOSIS — K219 Gastro-esophageal reflux disease without esophagitis: Secondary | ICD-10-CM | POA: Diagnosis present

## 2016-05-24 DIAGNOSIS — E785 Hyperlipidemia, unspecified: Secondary | ICD-10-CM | POA: Diagnosis not present

## 2016-05-24 DIAGNOSIS — Z79899 Other long term (current) drug therapy: Secondary | ICD-10-CM | POA: Diagnosis not present

## 2016-05-24 DIAGNOSIS — F17210 Nicotine dependence, cigarettes, uncomplicated: Secondary | ICD-10-CM | POA: Diagnosis present

## 2016-05-24 DIAGNOSIS — R0602 Shortness of breath: Secondary | ICD-10-CM | POA: Diagnosis not present

## 2016-05-24 DIAGNOSIS — G2581 Restless legs syndrome: Secondary | ICD-10-CM | POA: Diagnosis present

## 2016-05-24 DIAGNOSIS — Z6837 Body mass index (BMI) 37.0-37.9, adult: Secondary | ICD-10-CM | POA: Diagnosis not present

## 2016-05-24 DIAGNOSIS — I25110 Atherosclerotic heart disease of native coronary artery with unstable angina pectoris: Principal | ICD-10-CM

## 2016-05-24 DIAGNOSIS — F419 Anxiety disorder, unspecified: Secondary | ICD-10-CM | POA: Diagnosis present

## 2016-05-24 DIAGNOSIS — Z955 Presence of coronary angioplasty implant and graft: Secondary | ICD-10-CM | POA: Diagnosis not present

## 2016-05-24 DIAGNOSIS — Z981 Arthrodesis status: Secondary | ICD-10-CM | POA: Diagnosis not present

## 2016-05-24 DIAGNOSIS — G4733 Obstructive sleep apnea (adult) (pediatric): Secondary | ICD-10-CM | POA: Diagnosis present

## 2016-05-24 DIAGNOSIS — J449 Chronic obstructive pulmonary disease, unspecified: Secondary | ICD-10-CM | POA: Diagnosis not present

## 2016-05-24 DIAGNOSIS — I252 Old myocardial infarction: Secondary | ICD-10-CM | POA: Diagnosis not present

## 2016-05-24 DIAGNOSIS — F329 Major depressive disorder, single episode, unspecified: Secondary | ICD-10-CM | POA: Diagnosis present

## 2016-05-24 DIAGNOSIS — I10 Essential (primary) hypertension: Secondary | ICD-10-CM | POA: Diagnosis not present

## 2016-05-24 DIAGNOSIS — E669 Obesity, unspecified: Secondary | ICD-10-CM | POA: Diagnosis present

## 2016-05-24 DIAGNOSIS — Z7982 Long term (current) use of aspirin: Secondary | ICD-10-CM | POA: Diagnosis not present

## 2016-05-24 DIAGNOSIS — R079 Chest pain, unspecified: Secondary | ICD-10-CM | POA: Diagnosis not present

## 2016-05-24 DIAGNOSIS — Z9861 Coronary angioplasty status: Secondary | ICD-10-CM | POA: Diagnosis not present

## 2016-05-24 DIAGNOSIS — I2510 Atherosclerotic heart disease of native coronary artery without angina pectoris: Secondary | ICD-10-CM | POA: Diagnosis not present

## 2016-05-24 DIAGNOSIS — Z853 Personal history of malignant neoplasm of breast: Secondary | ICD-10-CM | POA: Diagnosis not present

## 2016-05-24 HISTORY — PX: CARDIAC CATHETERIZATION: SHX172

## 2016-05-24 LAB — TROPONIN I
Troponin I: <0.03 ng/mL (ref <0.03)
Troponin I: <0.03 ng/mL (ref <0.03)

## 2016-05-24 LAB — BASIC METABOLIC PANEL
ANION GAP: 5 (ref 5–15)
BUN: 9 mg/dL (ref 6–20)
CALCIUM: 8.5 mg/dL — AB (ref 8.9–10.3)
CO2: 28 mmol/L (ref 22–32)
Chloride: 109 mmol/L (ref 101–111)
Creatinine, Ser: 0.77 mg/dL (ref 0.44–1.00)
Glucose, Bld: 98 mg/dL (ref 65–99)
POTASSIUM: 4.6 mmol/L (ref 3.5–5.1)
SODIUM: 142 mmol/L (ref 135–145)

## 2016-05-24 LAB — CBC
HCT: 40.6 % (ref 36.0–46.0)
HEMOGLOBIN: 13.1 g/dL (ref 12.0–15.0)
MCH: 30.6 pg (ref 26.0–34.0)
MCHC: 32.3 g/dL (ref 30.0–36.0)
MCV: 94.9 fL (ref 78.0–100.0)
Platelets: 210 10*3/uL (ref 150–400)
RBC: 4.28 MIL/uL (ref 3.87–5.11)
RDW: 14.5 % (ref 11.5–15.5)
WBC: 11.8 10*3/uL — ABNORMAL HIGH (ref 4.0–10.5)

## 2016-05-24 LAB — LIPID PANEL
CHOL/HDL RATIO: 4.5 ratio
CHOLESTEROL: 171 mg/dL (ref 0–200)
HDL: 38 mg/dL — AB (ref >40)
LDL Cholesterol: 100 mg/dL — ABNORMAL HIGH (ref 0–99)
Triglycerides: 164 mg/dL — ABNORMAL HIGH (ref <150)
VLDL: 33 mg/dL (ref 0–40)

## 2016-05-24 LAB — HEPARIN LEVEL (UNFRACTIONATED): Heparin Unfractionated: 0.24 IU/mL — ABNORMAL LOW (ref 0.30–0.70)

## 2016-05-24 LAB — PROTIME-INR
INR: 1.07 (ref 0.00–1.49)
PROTHROMBIN TIME: 14.1 s (ref 11.6–15.2)

## 2016-05-24 LAB — POCT ACTIVATED CLOTTING TIME: Activated Clotting Time: 395 seconds

## 2016-05-24 SURGERY — LEFT HEART CATH AND CORONARY ANGIOGRAPHY
Anesthesia: LOCAL

## 2016-05-24 MED ORDER — SODIUM CHLORIDE 0.9 % IV SOLN: 250.0000 mL | INTRAVENOUS | Status: DC | PRN

## 2016-05-24 MED ORDER — MIDAZOLAM HCL 2 MG/2ML IJ SOLN
INTRAMUSCULAR | Status: AC
  Filled 2016-05-24: qty 2

## 2016-05-24 MED ORDER — FENTANYL CITRATE (PF) 100 MCG/2ML IJ SOLN
INTRAMUSCULAR | Status: DC | PRN
  Administered 2016-05-24 (×5): 25 ug via INTRAVENOUS

## 2016-05-24 MED ORDER — IOPAMIDOL (ISOVUE-370) INJECTION 76%
INTRAVENOUS | Status: DC | PRN
  Administered 2016-05-24: 55 mL via INTRA_ARTERIAL

## 2016-05-24 MED ORDER — TICAGRELOR 90 MG PO TABS
ORAL_TABLET | ORAL | Status: AC
  Filled 2016-05-24: qty 2

## 2016-05-24 MED ORDER — IOPAMIDOL (ISOVUE-370) INJECTION 76%
INTRAVENOUS | Status: AC
  Filled 2016-05-24: qty 50

## 2016-05-24 MED ORDER — TICAGRELOR 90 MG PO TABS
ORAL_TABLET | ORAL | Status: DC | PRN
  Administered 2016-05-24: 180 mg via ORAL

## 2016-05-24 MED ORDER — SODIUM CHLORIDE 0.9% FLUSH: 3.0000 mL | INTRAVENOUS | Status: DC | PRN

## 2016-05-24 MED ORDER — ONDANSETRON HCL 4 MG/2ML IJ SOLN
INTRAMUSCULAR | Status: DC | PRN
  Administered 2016-05-24: 4 mg via INTRAVENOUS

## 2016-05-24 MED ORDER — HEPARIN (PORCINE) IN NACL 2-0.9 UNIT/ML-% IJ SOLN
INTRAMUSCULAR | Status: AC
  Filled 2016-05-24: qty 1000

## 2016-05-24 MED ORDER — NICOTINE 21 MG/24HR TD PT24
21.0000 mg | MEDICATED_PATCH | Freq: Every day | TRANSDERMAL | Status: DC
  Administered 2016-05-24 – 2016-05-25 (×2): 21 mg via TRANSDERMAL
  Filled 2016-05-24 (×2): qty 1

## 2016-05-24 MED ORDER — MIDAZOLAM HCL 2 MG/2ML IJ SOLN
INTRAMUSCULAR | Status: DC | PRN
  Administered 2016-05-24 (×3): 1 mg via INTRAVENOUS

## 2016-05-24 MED ORDER — NITROGLYCERIN 1 MG/10 ML FOR IR/CATH LAB
INTRA_ARTERIAL | Status: AC
  Filled 2016-05-24: qty 10

## 2016-05-24 MED ORDER — ONDANSETRON HCL 4 MG/2ML IJ SOLN
INTRAMUSCULAR | Status: AC
  Filled 2016-05-24: qty 2

## 2016-05-24 MED ORDER — BIVALIRUDIN 250 MG IV SOLR
250.0000 mg | INTRAVENOUS | Status: DC | PRN
  Administered 2016-05-24: 1.75 mg/kg/h via INTRAVENOUS

## 2016-05-24 MED ORDER — BIVALIRUDIN 250 MG IV SOLR
INTRAVENOUS | Status: AC
  Filled 2016-05-24: qty 250

## 2016-05-24 MED ORDER — SODIUM CHLORIDE 0.9 % WEIGHT BASED INFUSION: 1.0000 mL/kg/h | INTRAVENOUS | Status: AC

## 2016-05-24 MED ORDER — NITROGLYCERIN 1 MG/10 ML FOR IR/CATH LAB
INTRA_ARTERIAL | Status: DC | PRN
  Administered 2016-05-24: 200 ug via INTRACORONARY

## 2016-05-24 MED ORDER — LIDOCAINE HCL (PF) 1 % IJ SOLN
INTRAMUSCULAR | Status: DC | PRN
Start: 2016-05-24 — End: 2016-05-24
  Administered 2016-05-24: 20 mL

## 2016-05-24 MED ORDER — TICAGRELOR 90 MG PO TABS
90.0000 mg | ORAL_TABLET | Freq: Two times a day (BID) | ORAL | Status: DC
  Administered 2016-05-24 – 2016-05-25 (×2): 90 mg via ORAL
  Filled 2016-05-24 (×2): qty 1

## 2016-05-24 MED ORDER — HEPARIN (PORCINE) IN NACL 2-0.9 UNIT/ML-% IJ SOLN
INTRAMUSCULAR | Status: DC | PRN
  Administered 2016-05-24: 1000 mL

## 2016-05-24 MED ORDER — FENTANYL CITRATE (PF) 100 MCG/2ML IJ SOLN
INTRAMUSCULAR | Status: AC
  Filled 2016-05-24: qty 2

## 2016-05-24 MED ORDER — SODIUM CHLORIDE 0.9% FLUSH: 3.0000 mL | Freq: Two times a day (BID) | INTRAVENOUS | Status: DC

## 2016-05-24 MED ORDER — FENTANYL CITRATE (PF) 100 MCG/2ML IJ SOLN
INTRAMUSCULAR | Status: DC | PRN
  Administered 2016-05-24: 50 ug via INTRAVENOUS

## 2016-05-24 MED ORDER — ANGIOPLASTY BOOK
Freq: Once | Status: AC
  Administered 2016-05-24: 20:00:00
  Filled 2016-05-24: qty 1

## 2016-05-24 MED ORDER — IOPAMIDOL (ISOVUE-370) INJECTION 76%
INTRAVENOUS | Status: DC | PRN
  Administered 2016-05-24: 175 mL via INTRA_ARTERIAL

## 2016-05-24 MED ORDER — BIVALIRUDIN BOLUS VIA INFUSION - CUPID
INTRAVENOUS | Status: DC | PRN
  Administered 2016-05-24: 69.075 mg via INTRAVENOUS

## 2016-05-24 MED ORDER — LIDOCAINE HCL (PF) 1 % IJ SOLN
INTRAMUSCULAR | Status: AC
  Filled 2016-05-24: qty 30

## 2016-05-24 MED ORDER — OXYCODONE-ACETAMINOPHEN 5-325 MG PO TABS: 1.0000 | ORAL_TABLET | ORAL | Status: DC | PRN

## 2016-05-24 MED ORDER — SODIUM CHLORIDE 0.9 % IV SOLN
INTRAVENOUS | Status: DC
  Administered 2016-05-24: 09:00:00 via INTRAVENOUS

## 2016-05-24 SURGICAL SUPPLY — 24 items
BALLN TREK RX 2.5X12 (BALLOONS) ×2
BALLN ~~LOC~~ TREK RX 3.25X8 (BALLOONS) ×2 IMPLANT
BALLN ~~LOC~~ TREK RX 3.75X12 (BALLOONS) ×2
BALLOON TREK RX 2.5X12 (BALLOONS) ×1 IMPLANT
BALLOON ~~LOC~~ TREK RX 3.75X12 (BALLOONS) ×1 IMPLANT
CATH INFINITI 5 FR 3DRC (CATHETERS) ×2 IMPLANT
CATH INFINITI 5 FR AR1 MOD (CATHETERS) ×2 IMPLANT
CATH INFINITI 5 FR AR2 MOD (CATHETERS) ×2 IMPLANT
CATH INFINITI 5FR AL1 (CATHETERS) ×2 IMPLANT
CATH INFINITI 5FR MULTPACK ANG (CATHETERS) ×2 IMPLANT
CATH SITESEER 5F NTR (CATHETERS) ×2 IMPLANT
CATH VISTA GUIDE 6FR AL1 (CATHETERS) ×2 IMPLANT
KIT ENCORE 26 ADVANTAGE (KITS) ×2 IMPLANT
KIT HEART LEFT (KITS) ×2 IMPLANT
PACK CARDIAC CATHETERIZATION (CUSTOM PROCEDURE TRAY) ×2 IMPLANT
SHEATH PINNACLE 5F 10CM (SHEATH) ×2 IMPLANT
SHEATH PINNACLE 6F 10CM (SHEATH) ×2 IMPLANT
STENT SYNERGY DES 3.5X16 (Permanent Stent) ×2 IMPLANT
STENT SYNERGY DES 3X12 (Permanent Stent) ×2 IMPLANT
SYR MEDRAD MARK V 150ML (SYRINGE) ×2 IMPLANT
TRANSDUCER W/STOPCOCK (MISCELLANEOUS) ×2 IMPLANT
TUBING CIL FLEX 10 FLL-RA (TUBING) ×2 IMPLANT
WIRE ASAHI PROWATER 180CM (WIRE) ×2 IMPLANT
WIRE EMERALD 3MM-J .035X150CM (WIRE) ×2 IMPLANT

## 2016-05-24 NOTE — Interval H&P Note (Signed)
History and Physical Interval Note:  05/24/2016 11:13 AM  Martha Mora  has presented today for surgery, with the diagnosis of Chest Pain  The various methods of treatment have been discussed with the patient and family. After consideration of risks, benefits and other options for treatment, the patient has consented to  Procedure(s): Left Heart Cath and Coronary Angiography (N/A) as a surgical intervention .  The patient's history has been reviewed, patient examined, no change in status, stable for surgery.  I have reviewed the patient's chart and labs.  Questions were answered to the patient's satisfaction.     Cowen Pesqueira S

## 2016-05-24 NOTE — Care Management Note (Signed)
Case Management Note  Patient Details  Name: Martha Mora MRN: [id number] Date of Birth: Apr 01, 1946  Subjective/Objective:           CM following for progression and d/c planning.         Action/Plan: 05/24/16 Following for possible medication support. Noted pt has insurance, will provide assistance cards when medication needs identified. Noted pt is independent at home with family support.   Expected Discharge Date:                  Expected Discharge Plan:  Home/Self Care  In-House Referral:  NA  Discharge planning Services  CM Consult  Post Acute Care Choice:  NA Choice offered to:  NA  DME Arranged:  N/A DME Agency:     HH Arranged:  NA HH Agency:     Status of Service:  In process, will continue to follow  If discussed at Long Length of Stay Meetings, dates discussed:    Additional Comments:  Adron Bene, RN 05/24/2016, 4:09 PM

## 2016-05-24 NOTE — Progress Notes (Signed)
ANTICOAGULATION CONSULT NOTE - Follow Up Consult  Pharmacy Consult for Heparin  Indication: chest pain/ACS  Allergies  Allergen Reactions  . Scallops [Shellfish Allergy] Nausea And Vomiting  . Penicillins Other (See Comments)    Red bumps all over stomach Has patient had a PCN reaction causing immediate rash, facial/tongue/throat swelling, SOB or lightheadedness with hypotension:YES Has patient had a PCN reaction causing severe rash involving mucus membranes or skin necrosis:NO Has patient had a PCN reaction that required hospitalization NO Has patient had a PCN reaction occurring within the last 10 years: NO If all of the above answers are "NO", then may proceed with Cephalosporin use.   Patient Measurements: Height: 5' 2.5" (158.8 cm) Weight: 202 lb 12.8 oz (91.989 kg) IBW/kg (Calculated) : 51.25  Vital Signs: Temp: 98.5 F (36.9 C) (07/06 2124) Temp Source: Oral (07/06 2124) BP: 151/57 mmHg (07/06 2124) Pulse Rate: 65 (07/06 2124)  Labs:  Recent Labs  05/23/16 1816 05/23/16 2059 05/24/16 0259  HGB 13.5  --  13.1  HCT 41.2  --  40.6  PLT 233  --  210  LABPROT  --   --  14.1  INR  --   --  1.07  HEPARINUNFRC  --   --  0.24*  CREATININE 0.81  --  0.77  TROPONINI  --  <0.03 <0.03    Estimated Creatinine Clearance: 67.8 mL/min (by C-G formula based on Cr of 0.77).   Assessment: Sub-therapeutic heparin level, no issues per RN.   Goal of Therapy:  Heparin level 0.3-0.7 units/ml Monitor platelets by anticoagulation protocol: Yes   Plan:  -Inc heparin to 1250 units/hr -1200 HL   Narda Bonds 05/24/2016,3:47 AM

## 2016-05-24 NOTE — Progress Notes (Signed)
Report called to Clair Gulling, RN on [facility] for room 5  angiomax d/c'd at [facility]

## 2016-05-25 LAB — BASIC METABOLIC PANEL
ANION GAP: 5 (ref 5–15)
BUN: 9 mg/dL (ref 6–20)
CALCIUM: 8.2 mg/dL — AB (ref 8.9–10.3)
CHLORIDE: 109 mmol/L (ref 101–111)
CO2: 27 mmol/L (ref 22–32)
Creatinine, Ser: 0.86 mg/dL (ref 0.44–1.00)
GFR calc non Af Amer: >60 mL/min (ref >60)
GLUCOSE: 121 mg/dL — AB (ref 65–99)
POTASSIUM: 4.2 mmol/L (ref 3.5–5.1)
Sodium: 141 mmol/L (ref 135–145)

## 2016-05-25 LAB — CBC
HEMATOCRIT: 41.2 % (ref 36.0–46.0)
HEMOGLOBIN: 13 g/dL (ref 12.0–15.0)
MCH: 30 pg (ref 26.0–34.0)
MCHC: 31.6 g/dL (ref 30.0–36.0)
MCV: 95.2 fL (ref 78.0–100.0)
Platelets: 232 10*3/uL (ref 150–400)
RBC: 4.33 MIL/uL (ref 3.87–5.11)
RDW: 14.4 % (ref 11.5–15.5)
WBC: 12.1 10*3/uL — ABNORMAL HIGH (ref 4.0–10.5)

## 2016-05-25 MED ORDER — NITROGLYCERIN 0.4 MG SL SUBL: 0.4000 mg | SUBLINGUAL_TABLET | SUBLINGUAL | Status: DC | PRN

## 2016-05-25 MED ORDER — TICAGRELOR 90 MG PO TABS: 90.0000 mg | ORAL_TABLET | Freq: Two times a day (BID) | ORAL | Status: DC

## 2016-05-25 MED ORDER — NICOTINE 21 MG/24HR TD PT24: 21.0000 mg | MEDICATED_PATCH | Freq: Every day | TRANSDERMAL | Status: DC

## 2016-05-25 MED ORDER — ASPIRIN 81 MG PO TBEC
81.0000 mg | DELAYED_RELEASE_TABLET | Freq: Every day | ORAL | Status: DC
Start: 2016-05-25 — End: 2018-08-11

## 2016-05-25 NOTE — Discharge Summary (Signed)
Discharge summary dictated on 05/25/2016 dictation number is [phone]

## 2016-05-25 NOTE — Progress Notes (Signed)
Patient refused to wear the CPAP at this time, She said she feels fine and will call if she needs it.

## 2016-05-25 NOTE — Discharge Instructions (Signed)
Acute Coronary Syndrome °Acute coronary syndrome (ACS) is a serious problem in which there is suddenly not enough blood and oxygen supplied to the heart. ACS may mean that one or more of the blood vessels in your heart (coronary arteries) may be blocked. ACS can result in chest pain or a heart attack (myocardial infarction or MI). °CAUSES °This condition is caused by atherosclerosis, which is the buildup of fat and cholesterol (plaque) on the inside of the arteries. Over time, the plaque may narrow or block the artery, and this will lessen blood flow to the heart. Plaque can also become weak and break off within a coronary artery to form a clot and cause a sudden blockage. °RISK FACTORS °The risks factors of this condition include: °· High cholesterol levels. °· High blood pressure (hypertension). °· Smoking. °· Diabetes. °· Age. °· Family history of chest pain, heart disease, or stroke. °· Lack of exercise. °SYMPTOMS °The most common signs of this condition include: °· Chest pain, which can be: °¨ A crushing or squeezing in the chest. °¨ A tightness, pressure, fullness, or heaviness in the chest. °¨ Present for more than a few minutes, or it can stop and recur. °· Pain in the arms, neck, jaw, or back. °· Unexplained heartburn or indigestion. °· Shortness of breath. °· Nausea. °· Sudden cold sweats. °· Feeling light-headed or dizzy. °Sometimes, this condition has no symptoms. °DIAGNOSIS °ACS may be diagnosed through the following tests: °· Electrocardiogram (ECG). °· Blood tests. °· Coronary angiogram. This is a procedure to look at the coronary arteries to see if there is any blockage. °TREATMENT °Treatment for ACS may include: °· Healthy behavioral changes to reduce or control risk factors. °· Medicine. °· Coronary stenting. A stent helps to keep an artery open. °· Coronary angioplasty. This procedure widens a narrowed or blocked artery. °· Coronary artery bypass surgery. This will allow your blood to pass the  blockage (bypass) to reach your heart. °HOME CARE INSTRUCTIONS °Eating and Drinking °· Follow a heart-healthy diet. A dietitian can you help to educate you about healthy food options and changes. °· Use healthy cooking methods such as roasting, grilling, broiling, baking, poaching, steaming, or stir-frying. Talk to a dietitian to learn more about healthy cooking methods. °Medicines °· Take medicines only as directed by your health care provider. °· Do not take the following medicines unless your health care provider approves: °¨ Nonsteroidal anti-inflammatory drugs (NSAIDs), such as ibuprofen, naproxen, or celecoxib. °¨ Vitamin supplements that contain vitamin A, vitamin E, or both. °¨ Hormone replacement therapy that contains estrogen with or without progestin. °· Stop illegal drug use. °Activities °· Follow an exercise program that is approved by your health care provider. °· Plan rest periods when you are fatigued. °Lifestyle °· Do not use any tobacco products, including cigarettes, chewing tobacco, or electronic cigarettes. If you need help quitting, ask your health care provider. °· If you drink alcohol, and your health care provider approves, limit your alcohol intake to no more than 1 drink per day. One drink equals 12 ounces of beer, 5 ounces of wine, or 1½ ounces of hard liquor. °· Learn to manage stress. °· Maintain a healthy weight. Lose weight as approved by your health care provider. °General Instructions °· Manage other health conditions, such as hypertension and diabetes, as directed by your health care provider. °· Keep all follow-up visits as directed by your health care provider. This is important. °· Your health care provider may ask you to monitor your blood   pressure. A blood pressure reading consists of a higher number over a lower number, such as 110 over 72, written as 110/72. Ideally, your blood pressure should be: °¨ Below 140/90 if you have no other medical conditions. °¨ Below 130/80 if  you have diabetes or kidney disease. °SEEK IMMEDIATE MEDICAL CARE IF: °· You have pain in your chest, neck, arm, jaw, stomach, or back that lasts more than a few minutes, is recurring, or is not relieved by taking medicine under your tongue (sublingual nitroglycerin). °· You have profuse sweating without cause. °· You have unexplained: °¨ Heartburn or indigestion. °¨ Shortness of breath or difficulty breathing. °¨ Nausea or vomiting. °¨ Fatigue. °¨ Feelings of nervousness or anxiety. °¨ Weakness. °¨ Diarrhea. °· You have sudden light-headedness or dizziness. °· You faint. °These symptoms may represent a serious problem that is an emergency. Do not wait to see if the symptoms will go away. Get medical help right away. Call your local emergency services (911 in the [place] Do not drive yourself to the clinic or hospital. °  °This information is not intended to replace advice given to you by your health care provider. Make sure you discuss any questions you have with your health care provider. °  °Document Released: 11/04/2005 Document Revised: 11/25/2014 Document Reviewed: 03/08/2014 °Elsevier Interactive Patient Education ©2016 Else[employer] °Coronary Angiogram With Stent °Coronary angiography with stent placement is a procedure to widen or open a narrow blood vessel of the heart (coronary artery). When a coronary artery becomes partially blocked, it decreases blood flow to that area. This may lead to chest pain or a heart attack (myocardial infarction). Arteries may become blocked by cholesterol buildup (plaque) in the lining or wall.  °A stent is a small piece of metal that looks like a mesh or a spring. Stent placement may be done right after a coronary angiography in which a blocked artery is found or as a treatment for a heart attack.  °LET YOUR HEALTH CARE PROVIDER KNOW ABOUT: °· Any allergies you have.   °· All medicines you are taking, including vitamins, herbs, eye drops, creams, and over-the-counter  medicines.   °· Previous problems you or members of your family have had with the use of anesthetics.   °· Any blood disorders you have.   °· Previous surgeries you have had.   °· Medical conditions you have. °RISKS AND COMPLICATIONS °Generally, coronary angiography with stent is a safe procedure. However, problems can occur and include: °· Damage to the heart or its blood vessels.   °· A return of blockage.   °· Bleeding, infection, or bruising at the insertion site.   °· A collection of blood under the skin (hematoma) at the insertion site. °· Blood clot in another part of the body.   °· Kidney injury.   °· Allergic reaction to the dye or contrast used.   °· Bleeding into the abdomen (retroperitoneal bleeding). °BEFORE THE PROCEDURE °· Do not eat or drink anything after midnight on the night before the procedure or as directed by your health care provider.  °· Ask your health care provider about changing or stopping your regular medicines. This is especially important if you are taking diabetes medicines or blood thinners. °· Your health care provider will make sure you understand the procedure as well as the risks and potential problems associated with the procedure.   °PROCEDURE °· You may be given a medicine to help you relax before and during the procedure (sedative). This medicine will be given through an IV tube that is put into one   of your veins.   The area where the catheter will be inserted will be shaved and cleaned. This is usually done in the groin but may be done in the fold of your arm (near your elbow) or in the wrist.   A medicine will be given to numb the area where the catheter will be inserted (local anesthetic).   The catheter will be inserted into an artery using a guide wire. A type of X-ray (fluoroscopy) will be used to help guide the catheter to the opening of the blocked artery.   A dye will then be injected into the catheter, and X-rays will be taken. The dye will help to  show where any narrowing or blockages are located in the heart arteries.   A tiny wire will be guided to the blocked spot, and a balloon will be inflated to make the artery wider. The stent will be expanded and will crush the plaque into the wall of the vessel. The stent will hold the area open like a scaffolding and improve the blood flow.   Sometimes the artery may be made wider using a laser or other tools to remove plaque.   When the blood flow is better, the catheter will be removed. The lining of the artery will grow over the stent, which stays where it was placed.  AFTER THE PROCEDURE  If the procedure is done through the leg, you will be kept in bed lying flat for about 6 hours. You will be instructed to not bend or cross your legs.   The insertion site will be checked frequently.   The pulse in your feet or wrist will be checked frequently.   Additional blood tests, X-rays, and electrocardiography may be done.   This information is not intended to replace advice given to you by your health care provider. Make sure you discuss any questions you have with your health care provider.   Document Released: 05/11/2003 Document Revised: 11/25/2014 Document Reviewed: 03/29/2013 Elsevier Interactive Patient Education 2016 Elsevier Inc.  STOP TAKING THE FOLLOWING MEDICATIONS:  AMLODIPINE (NORVASC) B COMPLEX WITH VIT C ISOSORBIDE DICOLFENAC (VOLTAREN) VITAMIN D3 (CHOLECALCIFEROL) VITAMIN E 400 UNITS (CONTINUE 200 UNITS DAILY)

## 2016-05-25 NOTE — Progress Notes (Signed)
CARDIAC REHAB PHASE I   PRE:  Rate/Rhythm: 75 SR  BP:  Supine: 141/46  Sitting:   Standing:   SaO2: 91 RA   MODE:  Ambulation: 250 ft   POST:  Rate/Rhythm: 85 SR  BP:  Supine:   Sitting: 157/52  Standing:    SaO2: 91 RA 0800-0915  Assisted X 1 to ambulate. Gait steady, slow pace and DOE. Pt took 3 standing rest stops during the 250 foot walk. She states that she has had this SOB for last 3 months and has not been able to do much of anything. This dyspnea has gotten pt very deconditioned. Completed discharge education with pt. She voices understanding. Referral sent to [facility]. CRP. Pt states that she is going to stop smoking. I gave her smoking cessation information. I have strongly encouraged cessation. Pt states that it is going to be the hardest thing I have ever done, provided emotional support.  Rodney Langton RN 05/25/2016 9:14 AM

## 2016-05-25 NOTE — Progress Notes (Signed)
CM met with pt in room and gave pt free 30 day trial card for Brilinta.  Pt verbalized understanding this card will pay for today's prescription and give time for insurance to authorize for refills.  No other CM needs were communicated.

## 2016-05-25 NOTE — Discharge Summary (Signed)
NAMEMarland Mora  LES, PASTRANA NO.:  [id number]  MEDICAL RECORD NO.:  [id number]  LOCATION:  6C05C                        FACILITY:  [facility]  PHYSICIAN:  Allegra Lai. [name], M.D. DATE OF BIRTH:  10-08-1946  DATE OF ADMISSION:  05/23/2016 DATE OF DISCHARGE:  05/25/2016                              DISCHARGE SUMMARY   ADMITTING DIAGNOSES: 1. Chest pain, rule out myocardial infarction, coronary artery     disease, history of myocardial infarction in the past, status post     PTCA stenting to RCA, LAD, and diagonal branch in the past. 2. Shortness of breath. 3. Chronic obstructive pulmonary disease. 4. Chronic bronchitis. 5. Hypertension. 6. Hyperlipidemia. 7. Obesity.  DISCHARGE DIAGNOSES: 1. Status post chest pain, myocardial infarction ruled out, status     post left cardiac cath/PTCA stenting to mid and distal RCA. 2. Coronary artery disease, history of myocardial infarction in the     remote past, status post PCI to RCA, LAD, and diagonal branch in     the past. 3. Chronic obstructive pulmonary disease. 4. Hypertension. 5. Hyperlipidemia. 6. Tobacco abuse. 7. Line morbid obesity.  DISCHARGE HOME MEDICATIONS: 1. Aspirin 81 mg one tablet daily. 2. Nicotine patch 21 mg per 24 hour daily. 3. Nitrostat 0.4 mg sublingual, use as directed. 4. Brilinta 90 mg one tablet twice daily. 5. Calcium carbonate with vitamin D one tablet daily. 6. Carvedilol 3.125 mg twice daily. 7. Citalopram 40 mg daily. 8. Omega-3 fish oil one capsule daily. 9. Flonase as needed as before. 10.Losartan 100 mg daily as before. 11.Myrbetriq 25 mg daily as before. 12.Multivitamin one tablet daily as before. 13.Prilosec 40 mg one capsule daily as before. 14.Pravastatin 40 mg one tablet daily. 15.Nolvadex 20 mg daily. 16.Trazodone 100 mg daily at night. 17.Vitamin D 200 units capsule daily. The patient has been advised to stop aspirin 325 mg and replace with 81 mg daily as above.  The patient  also has been advised to stop diclofenac, isosorbide dinitrate, tramadol.  DIET:  Low salt, low cholesterol.  ACTIVITY:  Increase activity slowly as tolerated.  Post cardiac cath/PTCA stent instructions have been given.  The patient will be scheduled for phase 2 cardiac rehab as outpatient.  CONDITION AT DISCHARGE:  Stable.  FOLLOWUP:  Follow up with Dr. [name] in one week.  BRIEF HISTORY:  [name] is a 70 year old female with past medical history significant for coronary artery disease, history of remote MI in the past, status post PTCA stenting to RCA, LAD, and diagonal branch, hypertension, hyperlipidemia who was admitted because of exertional dyspnea and chest discomfort.  The patient denies any fever or chills. The patient denies palpitation, lightheadedness, or syncope.  PHYSICAL EXAMINATION:  GENERAL:  On examination, she was alert, awake, oriented x3, in no acute distress. VITAL SIGNS:  Blood pressure 148/49, pulse 61.  She was afebrile. HEENT:  Conjunctivae were pink. NECK:  Supple.  No JVD.  No bruit. LUNGS:  Clear to auscultation bilaterally. CARDIOVASCULAR:  S1, S2 normal.  There was 3/6 systolic murmur. ABDOMEN:  Soft.  Bowel sounds were present.  Nontender. EXTREMITIES:  There was no clubbing, cyanosis, or edema.  LABORATORY DATA:  Sodium 140, potassium 3.9, BUN 11, creatinine 0.81, glucose was 93.  Hemoglobin was 13.5, hematocrit 41.2, white count of 13.1 with no shift to the left.  IMAGING STUDIES:  Chest x-ray showed no acute cardiopulmonary disease. EKG showed sinus bradycardia with no acute ischemic changes.  BRIEF HOSPITAL COURSE:  The patient was admitted to telemetry unit.  MI was ruled out by serial enzymes and EKG.  The patient subsequently underwent left cardiac catheterization and PTCA stenting to mid and distal RCA as per procedure report.  The patient tolerated the procedure well.  There were no complications.  Postprocedure, the patient  did not have any episodes of chest pain during the hospital stay.  Her groin is stable with no evidence of hematoma or bruit.  Phase 1 cardiac rehab was called.  The patient has ambulated in the hallway without any problems.  The patient will be scheduled for phase 2 cardiac rehab as outpatient and will be followed up by Dr. [name] in one week as outpatient.  The patient has been extensively counseled regarding diet compliance with medication and lifestyle changes.     Allegra Lai. [name], M.D.     MNH/MEDQ  D:  05/25/2016  T:  05/25/2016  Job:  [id number]

## 2016-05-31 DIAGNOSIS — G4733 Obstructive sleep apnea (adult) (pediatric): Secondary | ICD-10-CM | POA: Diagnosis not present

## 2016-06-10 ENCOUNTER — Other Ambulatory Visit: Payer: Self-pay | Admitting: *Deleted

## 2016-06-11 MED ORDER — TRAZODONE HCL 100 MG PO TABS: 100.0000 mg | ORAL_TABLET | Freq: Every evening | ORAL | 0 refills | Status: DC | PRN

## 2016-06-11 NOTE — Telephone Encounter (Signed)
2nd request.  Martin, Tamika L, RN  

## 2016-06-12 DIAGNOSIS — I10 Essential (primary) hypertension: Secondary | ICD-10-CM | POA: Diagnosis not present

## 2016-06-12 DIAGNOSIS — Z9861 Coronary angioplasty status: Secondary | ICD-10-CM | POA: Diagnosis not present

## 2016-06-12 DIAGNOSIS — I2510 Atherosclerotic heart disease of native coronary artery without angina pectoris: Secondary | ICD-10-CM | POA: Diagnosis not present

## 2016-06-14 ENCOUNTER — Ambulatory Visit
Admission: RE | Admit: 2016-06-14 | Discharge: 2016-06-14 | Disposition: A | Discharge status: Home / Self Care | Payer: Medicare Other | Source: Ambulatory Visit | Attending: Hematology | Admitting: Hematology

## 2016-06-14 DIAGNOSIS — Z1231 Encounter for screening mammogram for malignant neoplasm of breast: Secondary | ICD-10-CM | POA: Diagnosis not present

## 2016-06-20 ENCOUNTER — Telehealth (HOSPITAL_COMMUNITY): Payer: Self-pay | Admitting: *Deleted

## 2016-06-20 NOTE — Telephone Encounter (Signed)
Received signed order from Dr. Gurney Maxin.  Pt called and message left for her to please contact cardiac rehab. Cherre Huger, BSN

## 2016-06-24 ENCOUNTER — Encounter (HOSPITAL_COMMUNITY): Payer: Self-pay | Admitting: Emergency Medicine

## 2016-06-24 ENCOUNTER — Emergency Department (HOSPITAL_COMMUNITY)
Admission: EM | Admit: 2016-06-24 | Discharge: 2016-06-24 | Disposition: A | Discharge status: Home / Self Care | Payer: Medicare Other | Source: Home / Self Care | Attending: Emergency Medicine | Admitting: Emergency Medicine

## 2016-06-24 ENCOUNTER — Encounter (HOSPITAL_COMMUNITY): Payer: Self-pay | Admitting: *Deleted

## 2016-06-24 ENCOUNTER — Emergency Department (HOSPITAL_COMMUNITY)
Admission: EM | Admit: 2016-06-24 | Discharge: 2016-06-24 | Disposition: A | Discharge status: Home / Self Care | Payer: Medicare Other | Attending: Emergency Medicine | Admitting: Emergency Medicine

## 2016-06-24 DIAGNOSIS — I10 Essential (primary) hypertension: Secondary | ICD-10-CM

## 2016-06-24 DIAGNOSIS — I2510 Atherosclerotic heart disease of native coronary artery without angina pectoris: Secondary | ICD-10-CM | POA: Insufficient documentation

## 2016-06-24 DIAGNOSIS — F17210 Nicotine dependence, cigarettes, uncomplicated: Secondary | ICD-10-CM

## 2016-06-24 DIAGNOSIS — Z79899 Other long term (current) drug therapy: Secondary | ICD-10-CM

## 2016-06-24 DIAGNOSIS — Z853 Personal history of malignant neoplasm of breast: Secondary | ICD-10-CM | POA: Insufficient documentation

## 2016-06-24 DIAGNOSIS — Z955 Presence of coronary angioplasty implant and graft: Secondary | ICD-10-CM | POA: Insufficient documentation

## 2016-06-24 DIAGNOSIS — J449 Chronic obstructive pulmonary disease, unspecified: Secondary | ICD-10-CM | POA: Insufficient documentation

## 2016-06-24 DIAGNOSIS — I252 Old myocardial infarction: Secondary | ICD-10-CM | POA: Insufficient documentation

## 2016-06-24 DIAGNOSIS — G2581 Restless legs syndrome: Secondary | ICD-10-CM

## 2016-06-24 DIAGNOSIS — Z7901 Long term (current) use of anticoagulants: Secondary | ICD-10-CM

## 2016-06-24 DIAGNOSIS — Z85828 Personal history of other malignant neoplasm of skin: Secondary | ICD-10-CM | POA: Insufficient documentation

## 2016-06-24 DIAGNOSIS — Z7982 Long term (current) use of aspirin: Secondary | ICD-10-CM | POA: Insufficient documentation

## 2016-06-24 LAB — I-STAT CHEM 8, ED
BUN: 11 mg/dL (ref 6–20)
CALCIUM ION: 1.15 mmol/L (ref 1.12–1.23)
Chloride: 105 mmol/L (ref 101–111)
Creatinine, Ser: 0.7 mg/dL (ref 0.44–1.00)
GLUCOSE: 115 mg/dL — AB (ref 65–99)
HCT: 39 % (ref 36.0–46.0)
Hemoglobin: 13.3 g/dL (ref 12.0–15.0)
Potassium: 3.8 mmol/L (ref 3.5–5.1)
SODIUM: 141 mmol/L (ref 135–145)
TCO2: 24 mmol/L (ref 0–100)

## 2016-06-24 MED ORDER — LORAZEPAM 1 MG PO TABS
1.0000 mg | ORAL_TABLET | Freq: Once | ORAL | Status: AC
  Administered 2016-06-24: 1 mg via ORAL
  Filled 2016-06-24: qty 1
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
## 2016-06-24 NOTE — ED Triage Notes (Signed)
Pt was seen and discharged from ED today for increasing sensations of bugs crawling under skin. Pt returns now stating it has gotten worse to the point she cannot sit still or sleep.
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
## 2016-06-24 NOTE — Discharge Instructions (Signed)
1. Medications: usual home medications 2. Treatment: rest, drink plenty of fluids,  3. Follow Up: Please followup with your primary doctor in 3-5 days for discussion of your diagnoses and further evaluation after today's visit; if you do not have a primary care doctor use the resource guide provided to find one; Please return to the ER for worsening symptoms or other concerns

## 2016-06-24 NOTE — ED Provider Notes (Signed)
Medical screening examination/treatment/procedure(s) were conducted as a shared visit with non-physician practitioner(s) and myself.  I personally evaluated the patient during the encounter.   EKG Interpretation None      Pt is a 70 y.o. female with history of COPD, CAD who presents emergency permit with one year of restless leg syndrome. Has tried different medications in the past with minimal relief. No numbness or focal weakness. States she "can't sit still". Worse at night. No fevers, vomiting, diarrhea, cough. Neurologically intact on exam. Will give her dose of Ativan to help with her symptoms currently but discussed with patient this will need outpatient management. No sign of any life-threatening, emergent complaint this time. We'll discharge home. She has a PCP can follow-up tomorrow. She is comfortable with this plan. Discussed return precautions.   [place], DO 06/24/16 [phone]

## 2016-06-24 NOTE — Discharge Instructions (Signed)
Please follow with your primary care doctor in the next 2 days for a check-up. They must obtain records for further management.  ° °Do not hesitate to return to the Emergency Department for any new, worsening or concerning symptoms.  ° °

## 2016-06-24 NOTE — ED Triage Notes (Signed)
Patient presents stating she started on Brilinta about 1 month ago and since has been having trouble with what she feels like is restless leg syndrome all over her body

## 2016-06-26 DIAGNOSIS — Z9861 Coronary angioplasty status: Secondary | ICD-10-CM | POA: Diagnosis not present

## 2016-06-26 DIAGNOSIS — I2510 Atherosclerotic heart disease of native coronary artery without angina pectoris: Secondary | ICD-10-CM | POA: Diagnosis not present

## 2016-06-26 DIAGNOSIS — I10 Essential (primary) hypertension: Secondary | ICD-10-CM | POA: Diagnosis not present

## 2016-07-01 DIAGNOSIS — G4733 Obstructive sleep apnea (adult) (pediatric): Secondary | ICD-10-CM | POA: Diagnosis not present

## 2016-07-11 ENCOUNTER — Institutional Professional Consult (permissible substitution): Payer: Medicare Other | Admitting: Internal Medicine

## 2016-07-11 ENCOUNTER — Other Ambulatory Visit: Payer: Self-pay | Admitting: *Deleted

## 2016-07-11 MED ORDER — TRAZODONE HCL 100 MG PO TABS: 100.0000 mg | ORAL_TABLET | Freq: Every evening | ORAL | 0 refills | Status: DC | PRN

## 2016-07-15 ENCOUNTER — Ambulatory Visit: Payer: Medicare Other | Admitting: Family Medicine

## 2016-07-18 ENCOUNTER — Ambulatory Visit: Payer: Medicare Other | Admitting: *Deleted

## 2016-07-20 ENCOUNTER — Encounter (HOSPITAL_COMMUNITY): Payer: Self-pay

## 2016-07-20 ENCOUNTER — Emergency Department (HOSPITAL_COMMUNITY)
Admission: EM | Admit: 2016-07-20 | Discharge: 2016-07-20 | Disposition: A | Discharge status: Home / Self Care | Payer: Medicare Other | Attending: Emergency Medicine | Admitting: Emergency Medicine

## 2016-07-20 DIAGNOSIS — F17210 Nicotine dependence, cigarettes, uncomplicated: Secondary | ICD-10-CM | POA: Diagnosis not present

## 2016-07-20 DIAGNOSIS — Z955 Presence of coronary angioplasty implant and graft: Secondary | ICD-10-CM | POA: Diagnosis not present

## 2016-07-20 DIAGNOSIS — Z853 Personal history of malignant neoplasm of breast: Secondary | ICD-10-CM | POA: Diagnosis not present

## 2016-07-20 DIAGNOSIS — M545 Low back pain: Secondary | ICD-10-CM | POA: Diagnosis not present

## 2016-07-20 DIAGNOSIS — J449 Chronic obstructive pulmonary disease, unspecified: Secondary | ICD-10-CM | POA: Insufficient documentation

## 2016-07-20 DIAGNOSIS — N390 Urinary tract infection, site not specified: Secondary | ICD-10-CM | POA: Insufficient documentation

## 2016-07-20 DIAGNOSIS — I10 Essential (primary) hypertension: Secondary | ICD-10-CM | POA: Insufficient documentation

## 2016-07-20 DIAGNOSIS — R319 Hematuria, unspecified: Secondary | ICD-10-CM | POA: Diagnosis present

## 2016-07-20 DIAGNOSIS — I2510 Atherosclerotic heart disease of native coronary artery without angina pectoris: Secondary | ICD-10-CM | POA: Diagnosis not present

## 2016-07-20 DIAGNOSIS — I252 Old myocardial infarction: Secondary | ICD-10-CM | POA: Diagnosis not present

## 2016-07-20 DIAGNOSIS — G8929 Other chronic pain: Secondary | ICD-10-CM | POA: Diagnosis not present

## 2016-07-20 LAB — URINE MICROSCOPIC-ADD ON

## 2016-07-20 LAB — URINALYSIS, ROUTINE W REFLEX MICROSCOPIC
Bilirubin Urine: NEGATIVE
Glucose, UA: NEGATIVE mg/dL
KETONES UR: NEGATIVE mg/dL
NITRITE: POSITIVE — AB
PH: 7 (ref 5.0–8.0)
Protein, ur: 100 mg/dL — AB
SPECIFIC GRAVITY, URINE: 1.008 (ref 1.005–1.030)

## 2016-07-20 MED ORDER — SULFAMETHOXAZOLE-TRIMETHOPRIM 800-160 MG PO TABS: 1.0000 | ORAL_TABLET | Freq: Two times a day (BID) | ORAL | 0 refills | Status: AC

## 2016-07-20 MED ORDER — TRAMADOL HCL 50 MG PO TABS
50.0000 mg | ORAL_TABLET | Freq: Once | ORAL | Status: AC
  Administered 2016-07-20: 50 mg via ORAL
  Filled 2016-07-20: qty 1

## 2016-07-20 MED ORDER — SULFAMETHOXAZOLE-TRIMETHOPRIM 800-160 MG PO TABS
1.0000 | ORAL_TABLET | Freq: Once | ORAL | Status: AC
  Administered 2016-07-20: 1 via ORAL
  Filled 2016-07-20: qty 1

## 2016-07-20 MED ORDER — TRAMADOL HCL 50 MG PO TABS: 50.0000 mg | ORAL_TABLET | Freq: Four times a day (QID) | ORAL | 0 refills | Status: DC | PRN

## 2016-07-20 NOTE — ED Triage Notes (Signed)
Pt states that she woke up today with c/o dysuria and then started having hematuria. Denies flank pain. No fevers.

## 2016-07-20 NOTE — Discharge Instructions (Signed)
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
## 2016-08-01 DIAGNOSIS — G4733 Obstructive sleep apnea (adult) (pediatric): Secondary | ICD-10-CM | POA: Diagnosis not present

## 2016-08-05 ENCOUNTER — Ambulatory Visit (HOSPITAL_BASED_OUTPATIENT_CLINIC_OR_DEPARTMENT_OTHER): Payer: Medicare Other | Admitting: Hematology

## 2016-08-05 ENCOUNTER — Encounter: Payer: Self-pay | Admitting: Hematology

## 2016-08-05 ENCOUNTER — Telehealth: Payer: Self-pay | Admitting: Hematology

## 2016-08-05 ENCOUNTER — Other Ambulatory Visit (HOSPITAL_BASED_OUTPATIENT_CLINIC_OR_DEPARTMENT_OTHER): Payer: Medicare Other

## 2016-08-05 ENCOUNTER — Ambulatory Visit (HOSPITAL_BASED_OUTPATIENT_CLINIC_OR_DEPARTMENT_OTHER): Payer: Medicare Other

## 2016-08-05 VITALS — BP 140/64 | HR 74 | Temp 98.9°F | Resp 17 | Ht 62.0 in | Wt 197.7 lb

## 2016-08-05 DIAGNOSIS — I10 Essential (primary) hypertension: Secondary | ICD-10-CM | POA: Diagnosis not present

## 2016-08-05 DIAGNOSIS — M8580 Other specified disorders of bone density and structure, unspecified site: Secondary | ICD-10-CM

## 2016-08-05 DIAGNOSIS — C50511 Malignant neoplasm of lower-outer quadrant of right female breast: Secondary | ICD-10-CM

## 2016-08-05 DIAGNOSIS — C50911 Malignant neoplasm of unspecified site of right female breast: Secondary | ICD-10-CM | POA: Diagnosis not present

## 2016-08-05 DIAGNOSIS — Z23 Encounter for immunization: Secondary | ICD-10-CM

## 2016-08-05 DIAGNOSIS — M25551 Pain in right hip: Secondary | ICD-10-CM

## 2016-08-05 DIAGNOSIS — C50921 Malignant neoplasm of unspecified site of right male breast: Secondary | ICD-10-CM

## 2016-08-05 LAB — COMPREHENSIVE METABOLIC PANEL
ALBUMIN: 3 g/dL — AB (ref 3.5–5.0)
ALK PHOS: 63 U/L (ref 40–150)
ALT: 11 U/L (ref 0–55)
ANION GAP: 9 meq/L (ref 3–11)
AST: 16 U/L (ref 5–34)
BILIRUBIN TOTAL: 0.3 mg/dL (ref 0.20–1.20)
BUN: 12 mg/dL (ref 7.0–26.0)
CO2: 25 mEq/L (ref 22–29)
Calcium: 8.8 mg/dL (ref 8.4–10.4)
Chloride: 109 mEq/L (ref 98–109)
Creatinine: 0.8 mg/dL (ref 0.6–1.1)
EGFR: 79 mL/min/{1.73_m2} — AB (ref >90)
Glucose: 107 mg/dl (ref 70–140)
POTASSIUM: 4.1 meq/L (ref 3.5–5.1)
SODIUM: 142 meq/L (ref 136–145)
TOTAL PROTEIN: 6.1 g/dL — AB (ref 6.4–8.3)

## 2016-08-05 LAB — CBC WITH DIFFERENTIAL/PLATELET
BASO%: 1.5 % (ref 0.0–2.0)
BASOS ABS: 0.2 10*3/uL — AB (ref 0.0–0.1)
EOS ABS: 0.4 10*3/uL (ref 0.0–0.5)
EOS%: 3.7 % (ref 0.0–7.0)
HCT: 40.8 % (ref 34.8–46.6)
HEMOGLOBIN: 13.4 g/dL (ref 11.6–15.9)
LYMPH%: 20.3 % (ref 14.0–49.7)
MCH: 30.7 pg (ref 25.1–34.0)
MCHC: 32.9 g/dL (ref 31.5–36.0)
MCV: 93.1 fL (ref 79.5–101.0)
MONO#: 0.9 10*3/uL (ref 0.1–0.9)
MONO%: 7.1 % (ref 0.0–14.0)
NEUT%: 67.4 % (ref 38.4–76.8)
NEUTROS ABS: 8 10*3/uL — AB (ref 1.5–6.5)
PLATELETS: 302 10*3/uL (ref 145–400)
RBC: 4.38 10*6/uL (ref 3.70–5.45)
RDW: 14.2 % (ref 11.2–14.5)
WBC: 11.9 10*3/uL — AB (ref 3.9–10.3)
lymph#: 2.4 10*3/uL (ref 0.9–3.3)

## 2016-08-05 MED ORDER — INFLUENZA VAC SPLIT QUAD 0.5 ML IM SUSY
0.5000 mL | PREFILLED_SYRINGE | Freq: Once | INTRAMUSCULAR | Status: AC
  Administered 2016-08-05: 0.5 mL via INTRAMUSCULAR
  Filled 2016-08-05: qty 0.5

## 2016-08-05 MED ORDER — DENOSUMAB 60 MG/ML ~~LOC~~ SOLN
60.0000 mg | Freq: Once | SUBCUTANEOUS | Status: AC
  Administered 2016-08-05: 60 mg via SUBCUTANEOUS
  Filled 2016-08-05: qty 1

## 2016-08-05 NOTE — Patient Instructions (Addendum)
Denosumab injection What is this medicine? DENOSUMAB (den oh sue mab) slows bone breakdown. Prolia is used to treat osteoporosis in women after menopause and in men. Xgeva is used to prevent bone fractures and other bone problems caused by cancer bone metastases. Xgeva is also used to treat giant cell tumor of the bone. This medicine may be used for other purposes; ask your health care provider or pharmacist if you have questions. What should I tell my health care provider before I take this medicine? They need to know if you have any of these conditions: -dental disease -eczema -infection or history of infections -kidney disease or on dialysis -low blood calcium or vitamin D -malabsorption syndrome -scheduled to have surgery or tooth extraction -taking medicine that contains denosumab -thyroid or parathyroid disease -an unusual reaction to denosumab, other medicines, foods, dyes, or preservatives -pregnant or trying to get pregnant -breast-feeding How should I use this medicine? This medicine is for injection under the skin. It is given by a health care professional in a hospital or clinic setting. If you are getting Prolia, a special MedGuide will be given to you by the pharmacist with each prescription and refill. Be sure to read this information carefully each time. For Prolia, talk to your pediatrician regarding the use of this medicine in children. Special care may be needed. For Xgeva, talk to your pediatrician regarding the use of this medicine in children. While this drug may be prescribed for children as young as 13 years for selected conditions, precautions do apply. Overdosage: If you think you have taken too much of this medicine contact a poison control center or emergency room at once. NOTE: This medicine is only for you. Do not share this medicine with others. What if I miss a dose? It is important not to miss your dose. Call your doctor or health care professional if you are  unable to keep an appointment. What may interact with this medicine? Do not take this medicine with any of the following medications: -other medicines containing denosumab This medicine may also interact with the following medications: -medicines that suppress the immune system -medicines that treat cancer -steroid medicines like prednisone or cortisone This list may not describe all possible interactions. Give your health care provider a list of all the medicines, herbs, non-prescription drugs, or dietary supplements you use. Also tell them if you smoke, drink alcohol, or use illegal drugs. Some items may interact with your medicine. What should I watch for while using this medicine? Visit your doctor or health care professional for regular checks on your progress. Your doctor or health care professional may order blood tests and other tests to see how you are doing. Call your doctor or health care professional if you get a cold or other infection while receiving this medicine. Do not treat yourself. This medicine may decrease your body's ability to fight infection. You should make sure you get enough calcium and vitamin D while you are taking this medicine, unless your doctor tells you not to. Discuss the foods you eat and the vitamins you take with your health care professional. See your dentist regularly. Brush and floss your teeth as directed. Before you have any dental work done, tell your dentist you are receiving this medicine. Do not become pregnant while taking this medicine or for 5 months after stopping it. Women should inform their doctor if they wish to become pregnant or think they might be pregnant. There is a potential for serious side effects   to an unborn child. Talk to your health care professional or pharmacist for more information. What side effects may I notice from receiving this medicine? Side effects that you should report to your doctor or health care professional as soon as  possible: -allergic reactions like skin rash, itching or hives, swelling of the face, lips, or tongue -breathing problems -chest pain -fast, irregular heartbeat -feeling faint or lightheaded, falls -fever, chills, or any other sign of infection -muscle spasms, tightening, or twitches -numbness or tingling -skin blisters or bumps, or is dry, peels, or red -slow healing or unexplained pain in the mouth or jaw -unusual bleeding or bruising Side effects that usually do not require medical attention (Report these to your doctor or health care professional if they continue or are bothersome.): -muscle pain -stomach upset, gas This list may not describe all possible side effects. Call your doctor for medical advice about side effects. You may report side effects to FDA at 1-800-FDA-1088. Where should I keep my medicine? This medicine is only given in a clinic, doctor's office, or other health care setting and will not be stored at home. NOTE: This sheet is a summary. It may not cover all possible information. If you have questions about this medicine, talk to your doctor, pharmacist, or health care provider.    2016, Elsevier/Gold Standard. (2012-05-04 12:37:47) Influenza Virus Vaccine (Flucelvax) What is this medicine? INFLUENZA VIRUS VACCINE (in floo EN zuh VAHY ruhs vak SEEN) helps to reduce the risk of getting influenza also known as the flu. The vaccine only helps protect you against some strains of the flu. This medicine may be used for other purposes; ask your health care provider or pharmacist if you have questions. What should I tell my health care provider before I take this medicine? They need to know if you have any of these conditions: -bleeding disorder like hemophilia -fever or infection -Guillain-Barre syndrome or other neurological problems -immune system problems -infection with the human immunodeficiency virus (HIV) or AIDS -low blood platelet counts -multiple  sclerosis -an unusual or allergic reaction to influenza virus vaccine, other medicines, foods, dyes or preservatives -pregnant or trying to get pregnant -breast-feeding How should I use this medicine? This vaccine is for injection into a muscle. It is given by a health care professional. A copy of Vaccine Information Statements will be given before each vaccination. Read this sheet carefully each time. The sheet may change frequently. Talk to your pediatrician regarding the use of this medicine in children. Special care may be needed. Overdosage: If you think you've taken too much of this medicine contact a poison control center or emergency room at once. Overdosage: If you think you have taken too much of this medicine contact a poison control center or emergency room at once. NOTE: This medicine is only for you. Do not share this medicine with others. What if I miss a dose? This does not apply. What may interact with this medicine? -chemotherapy or radiation therapy -medicines that lower your immune system like etanercept, anakinra, infliximab, and adalimumab -medicines that treat or prevent blood clots like warfarin -phenytoin -steroid medicines like prednisone or cortisone -theophylline -vaccines This list may not describe all possible interactions. Give your health care provider a list of all the medicines, herbs, non-prescription drugs, or dietary supplements you use. Also tell them if you smoke, drink alcohol, or use illegal drugs. Some items may interact with your medicine. What should I watch for while using this medicine? Report any side effects   that do not go away within 3 days to your doctor or health care professional. Call your health care provider if any unusual symptoms occur within 6 weeks of receiving this vaccine. You may still catch the flu, but the illness is not usually as bad. You cannot get the flu from the vaccine. The vaccine will not protect against colds or other  illnesses that may cause fever. The vaccine is needed every year. What side effects may I notice from receiving this medicine? Side effects that you should report to your doctor or health care professional as soon as possible: -allergic reactions like skin rash, itching or hives, swelling of the face, lips, or tongue Side effects that usually do not require medical attention (Report these to your doctor or health care professional if they continue or are bothersome.): -fever -headache -muscle aches and pains -pain, tenderness, redness, or swelling at the injection site -tiredness This list may not describe all possible side effects. Call your doctor for medical advice about side effects. You may report side effects to FDA at 1-800-FDA-1088. Where should I keep my medicine? The vaccine will be given by a health care professional in a clinic, pharmacy, doctor's office, or other health care setting. You will not be given vaccine doses to store at home. NOTE: This sheet is a summary. It may not cover all possible information. If you have questions about this medicine, talk to your doctor, pharmacist, or health care provider.    2016, Elsevier/Gold Standard. (2011-10-16 14:06:47)  
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
## 2016-08-05 NOTE — Telephone Encounter (Signed)
Avs report and appointment schedule given to patient, per 08/05/16 los. °

## 2016-08-07 ENCOUNTER — Other Ambulatory Visit: Payer: Self-pay | Admitting: *Deleted

## 2016-08-07 ENCOUNTER — Ambulatory Visit: Payer: Medicare Other | Admitting: Neurology

## 2016-08-08 ENCOUNTER — Telehealth: Payer: Self-pay | Admitting: Family Medicine

## 2016-08-08 NOTE — Telephone Encounter (Signed)
Pharmacy sent refill request for trazodone. Pt has not been seen for this in our clinic for several months, would need an appointment prior to refilling this. Please schedule the patient for an appointment if she would like to discuss her sleep and this medicine, Thanks!

## 2016-08-14 DIAGNOSIS — I10 Essential (primary) hypertension: Secondary | ICD-10-CM | POA: Diagnosis not present

## 2016-08-14 DIAGNOSIS — I2510 Atherosclerotic heart disease of native coronary artery without angina pectoris: Secondary | ICD-10-CM | POA: Diagnosis not present

## 2016-08-14 DIAGNOSIS — Z9861 Coronary angioplasty status: Secondary | ICD-10-CM | POA: Diagnosis not present

## 2016-08-15 ENCOUNTER — Other Ambulatory Visit: Payer: Self-pay | Admitting: *Deleted

## 2016-08-15 ENCOUNTER — Ambulatory Visit: Payer: Medicare Other | Admitting: *Deleted

## 2016-08-15 ENCOUNTER — Encounter: Payer: Self-pay | Admitting: *Deleted

## 2016-08-15 ENCOUNTER — Ambulatory Visit (INDEPENDENT_AMBULATORY_CARE_PROVIDER_SITE_OTHER): Payer: Medicare Other | Admitting: *Deleted

## 2016-08-15 VITALS — BP 109/52 | HR 69 | Temp 98.4°F | Ht 62.0 in | Wt 198.8 lb

## 2016-08-15 DIAGNOSIS — Z0000 Encounter for general adult medical examination without abnormal findings: Secondary | ICD-10-CM

## 2016-08-15 MED ORDER — MIRABEGRON ER 25 MG PO TB24: 25.0000 mg | ORAL_TABLET | Freq: Every day | ORAL | 2 refills | Status: DC

## 2016-08-15 NOTE — Patient Instructions (Signed)
Fat and Cholesterol Restricted Diet Getting too much fat and cholesterol in your diet may cause health problems. Following this diet helps keep your fat and cholesterol at normal levels. This can keep you from getting sick. WHAT TYPES OF FAT SHOULD I CHOOSE?  Choose monosaturated and polyunsaturated fats. These are found in foods such as olive oil, canola oil, flaxseeds, walnuts, almonds, and seeds.  Eat more omega-3 fats. Good choices include salmon, mackerel, sardines, tuna, flaxseed oil, and ground flaxseeds.  Limit saturated fats. These are in animal products such as meats, butter, and cream. They can also be in plant products such as palm oil, palm kernel oil, and coconut oil.   Avoid foods with partially hydrogenated oils in them. These contain trans fats. Examples of foods that have trans fats are stick margarine, some tub margarines, cookies, crackers, and other baked goods. WHAT GENERAL GUIDELINES DO I NEED TO FOLLOW?   Check food labels. Look for the words "trans fat" and "saturated fat."  When preparing a meal:  Fill half of your plate with vegetables and green salads.  Fill one fourth of your plate with whole grains. Look for the word "whole" as the first word in the ingredient list.  Fill one fourth of your plate with lean protein foods.  Limit fruit to two servings a day. Choose fruit instead of juice.  Eat more foods with soluble fiber. Examples of foods with this type of fiber are apples, broccoli, carrots, beans, peas, and barley. Try to get 20-30 g (grams) of fiber per day.  Eat more home-cooked foods. Eat less at restaurants and buffets.  Limit or avoid alcohol.  Limit foods high in starch and sugar.  Limit fried foods.  Cook foods without frying them. Baking, boiling, grilling, and broiling are all great options.  Lose weight if you are overweight. Losing even a small amount of weight can help your overall health. It can also help prevent diseases such as  diabetes and heart disease. WHAT FOODS CAN I EAT? Grains Whole grains, such as whole wheat or whole grain breads, crackers, cereals, and pasta. Unsweetened oatmeal, bulgur, barley, quinoa, or brown rice. Corn or whole wheat flour tortillas. Vegetables Fresh or frozen vegetables (raw, steamed, roasted, or grilled). Green salads. Fruits All fresh, canned (in natural juice), or frozen fruits. Meat and Other Protein Products Ground beef (85% or leaner), grass-fed beef, or beef trimmed of fat. Skinless chicken or [place]. Ground chicken or [place]. Pork trimmed of fat. All fish and seafood. Eggs. Dried beans, peas, or lentils. Unsalted nuts or seeds. Unsalted canned or dry beans. Dairy Low-fat dairy products, such as skim or 1% milk, 2% or reduced-fat cheeses, low-fat ricotta or cottage cheese, or plain low-fat yogurt. Fats and Oils Tub margarines without trans fats. Light or reduced-fat mayonnaise and salad dressings. Avocado. Olive, canola, sesame, or safflower oils. Natural peanut or almond butter (choose ones without added sugar and oil). The items listed above may not be a complete list of recommended foods or beverages. Contact your dietitian for more options. WHAT FOODS ARE NOT RECOMMENDED? Grains White bread. White pasta. White rice. Cornbread. Bagels, pastries, and croissants. Crackers that contain trans fat. Vegetables White potatoes. Corn. Creamed or fried vegetables. Vegetables in a cheese sauce. Fruits Dried fruits. Canned fruit in light or heavy syrup. Fruit juice. Meat and Other Protein Products Fatty cuts of meat. Ribs, chicken wings, bacon, sausage, bologna, salami, chitterlings, fatback, hot dogs, bratwurst, and packaged luncheon meats. Liver and organ meats.  Dairy Whole or 2% milk, cream, half-and-half, and cream cheese. Whole milk cheeses. Whole-fat or sweetened yogurt. Full-fat cheeses. Nondairy creamers and whipped toppings. Processed cheese, cheese spreads, or cheese  curds. Sweets and Desserts Corn syrup, sugars, honey, and molasses. Candy. Jam and jelly. Syrup. Sweetened cereals. Cookies, pies, cakes, donuts, muffins, and ice cream. Fats and Oils Butter, stick margarine, lard, shortening, ghee, or bacon fat. Coconut, palm kernel, or palm oils. Beverages Alcohol. Sweetened drinks (such as sodas, lemonade, and fruit drinks or punches). The items listed above may not be a complete list of foods and beverages to avoid. Contact your dietitian for more information.   This information is not intended to replace advice given to you by your health care provider. Make sure you discuss any questions you have with your health care provider.   Document Released: 05/05/2012 Document Revised: 11/25/2014 Document Reviewed: 02/03/2014 Elsevier Interactive Patient Education 2016 [place] in the Home  Falls can cause injuries. They can happen to people of all ages. There are many things you can do to make your home safe and to help prevent falls.  WHAT CAN I DO ON THE OUTSIDE OF MY HOME?  Regularly fix the edges of walkways and driveways and fix any cracks.  Remove anything that might make you trip as you walk through a door, such as a raised step or threshold.  Trim any bushes or trees on the path to your home.  Use bright outdoor lighting.  Clear any walking paths of anything that might make someone trip, such as rocks or tools.  Regularly check to see if handrails are loose or broken. Make sure that both sides of any steps have handrails.  Any raised decks and porches should have guardrails on the edges.  Have any leaves, snow, or ice cleared regularly.  Use sand or salt on walking paths during winter.  Clean up any spills in your garage right away. This includes oil or grease spills. WHAT CAN I DO IN THE BATHROOM?   Use night lights.  Install grab bars by the toilet and in the tub and shower. Do not use towel bars as grab  bars.  Use non-skid mats or decals in the tub or shower.  If you need to sit down in the shower, use a plastic, non-slip stool.  Keep the floor dry. Clean up any water that spills on the floor as soon as it happens.  Remove soap buildup in the tub or shower regularly.  Attach bath mats securely with double-sided non-slip rug tape.  Do not have throw rugs and other things on the floor that can make you trip. WHAT CAN I DO IN THE BEDROOM?  Use night lights.  Make sure that you have a light by your bed that is easy to reach.  Do not use any sheets or blankets that are too big for your bed. They should not hang down onto the floor.  Have a firm chair that has side arms. You can use this for support while you get dressed.  Do not have throw rugs and other things on the floor that can make you trip. WHAT CAN I DO IN THE KITCHEN?  Clean up any spills right away.  Avoid walking on wet floors.  Keep items that you use a lot in easy-to-reach places.  If you need to reach something above you, use a strong step stool that has a grab bar.  Keep electrical cords out of  the way.  Do not use floor polish or wax that makes floors slippery. If you must use wax, use non-skid floor wax.  Do not have throw rugs and other things on the floor that can make you trip. WHAT CAN I DO WITH MY STAIRS?  Do not leave any items on the stairs.  Make sure that there are handrails on both sides of the stairs and use them. Fix handrails that are broken or loose. Make sure that handrails are as long as the stairways.  Check any carpeting to make sure that it is firmly attached to the stairs. Fix any carpet that is loose or worn.  Avoid having throw rugs at the top or bottom of the stairs. If you do have throw rugs, attach them to the floor with carpet tape.  Make sure that you have a light switch at the top of the stairs and the bottom of the stairs. If you do not have them, ask someone to add them for  you. WHAT ELSE CAN I DO TO HELP PREVENT FALLS?  Wear shoes that:  Do not have high heels.  Have rubber bottoms.  Are comfortable and fit you well.  Are closed at the toe. Do not wear sandals.  If you use a stepladder:  Make sure that it is fully opened. Do not climb a closed stepladder.  Make sure that both sides of the stepladder are locked into place.  Ask someone to hold it for you, if possible.  Clearly mark and make sure that you can see:  Any grab bars or handrails.  First and last steps.  Where the edge of each step is.  Use tools that help you move around (mobility aids) if they are needed. These include:  Canes.  Walkers.  Scooters.  Crutches.  Turn on the lights when you go into a dark area. Replace any light bulbs as soon as they burn out.  Set up your furniture so you have a clear path. Avoid moving your furniture around.  If any of your floors are uneven, fix them.  If there are any pets around you, be aware of where they are.  Review your medicines with your doctor. Some medicines can make you feel dizzy. This can increase your chance of falling. Ask your doctor what other things that you can do to help prevent falls.   This information is not intended to replace advice given to you by your health care provider. Make sure you discuss any questions you have with your health care provider.   Document Released: 08/31/2009 Document Revised: 03/21/2015 Document Reviewed: 12/09/2014 Elsevier Interactive Patient Education 2016 [place] Maintenance, Female Adopting a healthy lifestyle and getting preventive care can go a long way to promote health and wellness. Talk with your health care provider about what schedule of regular examinations is right for you. This is a good chance for you to check in with your provider about disease prevention and staying healthy. In between checkups, there are plenty of things you can do on your own. Experts  have done a lot of research about which lifestyle changes and preventive measures are most likely to keep you healthy. Ask your health care provider for more information. WEIGHT AND DIET  Eat a healthy diet  Be sure to include plenty of vegetables, fruits, low-fat dairy products, and lean protein.  Do not eat a lot of foods high in solid fats, added sugars, or salt.  Get regular exercise.  This is one of the most important things you can do for your health.  Most adults should exercise for at least 150 minutes each week. The exercise should increase your heart rate and make you sweat (moderate-intensity exercise).  Most adults should also do strengthening exercises at least twice a week. This is in addition to the moderate-intensity exercise.  Maintain a healthy weight  Body mass index (BMI) is a measurement that can be used to identify possible weight problems. It estimates body fat based on height and weight. Your health care provider can help determine your BMI and help you achieve or maintain a healthy weight.  For females 62 years of age and older:   A BMI below 18.5 is considered underweight.  A BMI of 18.5 to 24.9 is normal.  A BMI of 25 to 29.9 is considered overweight.  A BMI of 30 and above is considered obese.  Watch levels of cholesterol and blood lipids  You should start having your blood tested for lipids and cholesterol at 72 years of age, then have this test every 5 years.  You may need to have your cholesterol levels checked more often if:  Your lipid or cholesterol levels are high.  You are older than 72 years of age.  You are at high risk for heart disease.  CANCER SCREENING   Lung Cancer  Lung cancer screening is recommended for adults 46-3 years old who are at high risk for lung cancer because of a history of smoking.  A yearly low-dose CT scan of the lungs is recommended for people who:  Currently smoke.  Have quit within the past 15  years.  Have at least a 30-pack-year history of smoking. A pack year is smoking an average of one pack of cigarettes a day for 1 year.  Yearly screening should continue until it has been 15 years since you quit.  Yearly screening should stop if you develop a health problem that would prevent you from having lung cancer treatment.  Breast Cancer  Practice breast self-awareness. This means understanding how your breasts normally appear and feel.  It also means doing regular breast self-exams. Let your health care provider know about any changes, no matter how small.  If you are in your 20s or 30s, you should have a clinical breast exam (CBE) by a health care provider every 1-3 years as part of a regular health exam.  If you are 19 or older, have a CBE every year. Also consider having a breast X-ray (mammogram) every year.  If you have a family history of breast cancer, talk to your health care provider about genetic screening.  If you are at high risk for breast cancer, talk to your health care provider about having an MRI and a mammogram every year.  Breast cancer gene (BRCA) assessment is recommended for women who have family members with BRCA-related cancers. BRCA-related cancers include:  Breast.  Ovarian.  Tubal.  Peritoneal cancers.  Results of the assessment will determine the need for genetic counseling and BRCA1 and BRCA2 testing. Cervical Cancer Your health care provider may recommend that you be screened regularly for cancer of the pelvic organs (ovaries, uterus, and vagina). This screening involves a pelvic examination, including checking for microscopic changes to the surface of your cervix (Pap test). You may be encouraged to have this screening done every 3 years, beginning at age 57.  For women ages 47-65, health care providers may recommend pelvic exams and Pap testing every  3 years, or they may recommend the Pap and pelvic exam, combined with testing for human  papilloma virus (HPV), every 5 years. Some types of HPV increase your risk of cervical cancer. Testing for HPV may also be done on women of any age with unclear Pap test results.  Other health care providers may not recommend any screening for nonpregnant women who are considered low risk for pelvic cancer and who do not have symptoms. Ask your health care provider if a screening pelvic exam is right for you.  If you have had past treatment for cervical cancer or a condition that could lead to cancer, you need Pap tests and screening for cancer for at least 20 years after your treatment. If Pap tests have been discontinued, your risk factors (such as having a new sexual partner) need to be reassessed to determine if screening should resume. Some women have medical problems that increase the chance of getting cervical cancer. In these cases, your health care provider may recommend more frequent screening and Pap tests. Colorectal Cancer  This type of cancer can be detected and often prevented.  Routine colorectal cancer screening usually begins at 72 years of age and continues through 72 years of age.  Your health care provider may recommend screening at an earlier age if you have risk factors for colon cancer.  Your health care provider may also recommend using home test kits to check for hidden blood in the stool.  A small camera at the end of a tube can be used to examine your colon directly (sigmoidoscopy or colonoscopy). This is done to check for the earliest forms of colorectal cancer.  Routine screening usually begins at age 23.  Direct examination of the colon should be repeated every 5-10 years through 72 years of age. However, you may need to be screened more often if early forms of precancerous polyps or small growths are found. Skin Cancer  Check your skin from head to toe regularly.  Tell your health care provider about any new moles or changes in moles, especially if there is a  change in a mole's shape or color.  Also tell your health care provider if you have a mole that is larger than the size of a pencil eraser.  Always use sunscreen. Apply sunscreen liberally and repeatedly throughout the day.  Protect yourself by wearing long sleeves, pants, a wide-brimmed hat, and sunglasses whenever you are outside. HEART DISEASE, DIABETES, AND HIGH BLOOD PRESSURE   High blood pressure causes heart disease and increases the risk of stroke. High blood pressure is more likely to develop in:  People who have blood pressure in the high end of the normal range (130-139/85-89 mm Hg).  People who are overweight or obese.  People who are African American.  If you are 71-65 years of age, have your blood pressure checked every 3-5 years. If you are 69 years of age or older, have your blood pressure checked every year. You should have your blood pressure measured twice--once when you are at a hospital or clinic, and once when you are not at a hospital or clinic. Record the average of the two measurements. To check your blood pressure when you are not at a hospital or clinic, you can use:  An automated blood pressure machine at a pharmacy.  A home blood pressure monitor.  If you are between 68 years and 84 years old, ask your health care provider if you should take aspirin to prevent strokes.  Have regular diabetes screenings. This involves taking a blood sample to check your fasting blood sugar level.  If you are at a normal weight and have a low risk for diabetes, have this test once every three years after 72 years of age.  If you are overweight and have a high risk for diabetes, consider being tested at a younger age or more often. PREVENTING INFECTION  Hepatitis B  If you have a higher risk for hepatitis B, you should be screened for this virus. You are considered at high risk for hepatitis B if:  You were born in a country where hepatitis B is common. Ask your health  care provider which countries are considered high risk.  Your parents were born in a high-risk country, and you have not been immunized against hepatitis B (hepatitis B vaccine).  You have HIV or AIDS.  You use needles to inject street drugs.  You live with someone who has hepatitis B.  You have had sex with someone who has hepatitis B.  You get hemodialysis treatment.  You take certain medicines for conditions, including cancer, organ transplantation, and autoimmune conditions. Hepatitis C  Blood testing is recommended for:  Everyone born from [birth year] through 1967.  Anyone with known risk factors for hepatitis C. Sexually transmitted infections (STIs)  You should be screened for sexually transmitted infections (STIs) including gonorrhea and chlamydia if:  You are sexually active and are younger than 72 years of age.  You are older than 72 years of age and your health care provider tells you that you are at risk for this type of infection.  Your sexual activity has changed since you were last screened and you are at an increased risk for chlamydia or gonorrhea. Ask your health care provider if you are at risk.  If you do not have HIV, but are at risk, it may be recommended that you take a prescription medicine daily to prevent HIV infection. This is called pre-exposure prophylaxis (PrEP). You are considered at risk if:  You are sexually active and do not regularly use condoms or know the HIV status of your partner(s).  You take drugs by injection.  You are sexually active with a partner who has HIV. Talk with your health care provider about whether you are at high risk of being infected with HIV. If you choose to begin PrEP, you should first be tested for HIV. You should then be tested every 3 months for as long as you are taking PrEP.  PREGNANCY   If you are premenopausal and you may become pregnant, ask your health care provider about preconception counseling.  If you may  become pregnant, take 400 to 800 micrograms (mcg) of folic acid every day.  If you want to prevent pregnancy, talk to your health care provider about birth control (contraception). OSTEOPOROSIS AND MENOPAUSE   Osteoporosis is a disease in which the bones lose minerals and strength with aging. This can result in serious bone fractures. Your risk for osteoporosis can be identified using a bone density scan.  If you are 31 years of age or older, or if you are at risk for osteoporosis and fractures, ask your health care provider if you should be screened.  Ask your health care provider whether you should take a calcium or vitamin D supplement to lower your risk for osteoporosis.  Menopause may have certain physical symptoms and risks.  Hormone replacement therapy may reduce some of these symptoms and risks. Talk to  your health care provider about whether hormone replacement therapy is right for you.  HOME CARE INSTRUCTIONS   Schedule regular health, dental, and eye exams.  Stay current with your immunizations.   Do not use any tobacco products including cigarettes, chewing tobacco, or electronic cigarettes.  If you are pregnant, do not drink alcohol.  If you are breastfeeding, limit how much and how often you drink alcohol.  Limit alcohol intake to no more than 1 drink per day for nonpregnant women. One drink equals 12 ounces of beer, 5 ounces of wine, or 1 ounces of hard liquor.  Do not use street drugs.  Do not share needles.  Ask your health care provider for help if you need support or information about quitting drugs.  Tell your health care provider if you often feel depressed.  Tell your health care provider if you have ever been abused or do not feel safe at home.   This information is not intended to replace advice given to you by your health care provider. Make sure you discuss any questions you have with your health care provider.   Document Released: 05/20/2011  Document Revised: 11/25/2014 Document Reviewed: 10/06/2013 Elsevier Interactive Patient Education 2016 [place] Can Quit Smoking If you are ready to quit smoking or are thinking about it, congratulations! You have chosen to help yourself be healthier and live longer! There are lots of different ways to quit smoking. Nicotine gum, nicotine patches, a nicotine inhaler, or nicotine nasal spray can help with physical craving. Hypnosis, support groups, and medicines help break the habit of smoking. TIPS TO GET OFF AND STAY OFF CIGARETTES  Learn to predict your moods. Do not let a bad situation be your excuse to have a cigarette. Some situations in your life might tempt you to have a cigarette.  Ask friends and co-workers not to smoke around you.  Make your home smoke-free.  Never have "just one" cigarette. It leads to wanting another and another. Remind yourself of your decision to quit.  On a card, make a list of your reasons for not smoking. Read it at least the same number of times a day as you have a cigarette. Tell yourself everyday, "I do not want to smoke. I choose not to smoke."  Ask someone at home or work to help you with your plan to quit smoking.  Have something planned after you eat or have a cup of coffee. Take a walk or get other exercise to perk you up. This will help to keep you from overeating.  Try a relaxation exercise to calm you down and decrease your stress. Remember, you may be tense and nervous the first two weeks after you quit. This will pass.  Find new activities to keep your hands busy. Play with a pen, coin, or rubber band. Doodle or draw things on paper.  Brush your teeth right after eating. This will help cut down the craving for the taste of tobacco after meals. You can try mouthwash too.  Try gum, breath mints, or diet candy to keep something in your mouth. IF YOU SMOKE AND WANT TO QUIT:  Do not stock up on cigarettes. Never buy a carton. Wait  until one pack is finished before you buy another.  Never carry cigarettes with you at work or at home.  Keep cigarettes as far away from you as possible. Leave them with someone else.  Never carry matches or a lighter with you.  Ask  yourself, "Do I need this cigarette or is this just a reflex?"  Bet with someone that you can quit. Put cigarette money in a piggy bank every morning. If you smoke, you give up the money. If you do not smoke, by the end of the week, you keep the money.  Keep trying. It takes 21 days to change a habit!  Talk to your doctor about using medicines to help you quit. These include nicotine replacement gum, lozenges, or skin patches.   This information is not intended to replace advice given to you by your health care provider. Make sure you discuss any questions you have with your health care provider.   Document Released: 08/31/2009 Document Revised: 01/27/2012 Document Reviewed: 08/31/2009 Elsevier Interactive Patient Education [employer].
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
## 2016-08-15 NOTE — Telephone Encounter (Signed)
lmovm for pt to return call.  When she does please schedule an appointment.  Fleeger, [name], CMA

## 2016-08-16 ENCOUNTER — Telehealth: Payer: Self-pay | Admitting: Family Medicine

## 2016-08-16 ENCOUNTER — Other Ambulatory Visit: Payer: Self-pay | Admitting: Family Medicine

## 2016-08-16 DIAGNOSIS — N390 Urinary tract infection, site not specified: Secondary | ICD-10-CM

## 2016-08-16 MED ORDER — CEPHALEXIN 500 MG PO CAPS: 500.0000 mg | ORAL_CAPSULE | Freq: Four times a day (QID) | ORAL | 0 refills | Status: DC

## 2016-08-16 NOTE — Telephone Encounter (Signed)
Pt called to check the status of her request for medication for a UTI,. jw

## 2016-08-16 NOTE — Progress Notes (Signed)
Patient presented to ED earlier this month for UTI.  Called to ask if I could fill a prescription.  Spoke with her about symptoms and she stated that she has dysuria without any back pain or fevers or chills. Wrote for keflex 500mg  4x daily for 5 days.  Asked her to come in if she develops fever, chills or worsening pain.

## 2016-08-16 NOTE — Telephone Encounter (Signed)
Pt states her UTI is coming back and wants to see if Dr. Rosalyn Gess and call her in the same antibiotics that the pt used last time. Please call pt and let her know if this is possible. Thanks! ep

## 2016-08-16 NOTE — Telephone Encounter (Signed)
Spoke with patient regarding symptoms and history.  She was diagnosed with UTI earlier this month by [facility].  There was no culture done.  She was on 3 days of bactrim with some relief.  She denied any fevers, chills, hematuria or CVA tenderness, but does have dysuria.  I wrote for 5 days of keflex 500mg  QID.  I told her to come into the office if she develops fever or any of the above symptoms.

## 2016-08-18 ENCOUNTER — Encounter: Payer: Self-pay | Admitting: Family Medicine

## 2016-08-19 ENCOUNTER — Telehealth: Payer: Self-pay | Admitting: Family Medicine

## 2016-08-19 NOTE — Telephone Encounter (Signed)
Pt was given a medication for restless leg syndrome and it has made it worse. Pt would like to try something different. Pt is also only sleeping an hour a night and would like to get something to help her sleep. Please advise. Thanks! ep

## 2016-08-19 NOTE — Progress Notes (Signed)
I have reviewed this visit and discussed with Lauren Ducatte, RN, BSN, and agree with her documentation.   

## 2016-08-26 DIAGNOSIS — H2513 Age-related nuclear cataract, bilateral: Secondary | ICD-10-CM | POA: Diagnosis not present

## 2016-08-26 DIAGNOSIS — H353132 Nonexudative age-related macular degeneration, bilateral, intermediate dry stage: Secondary | ICD-10-CM | POA: Diagnosis not present

## 2016-08-26 DIAGNOSIS — H3562 Retinal hemorrhage, left eye: Secondary | ICD-10-CM | POA: Diagnosis not present

## 2016-08-31 DIAGNOSIS — G4733 Obstructive sleep apnea (adult) (pediatric): Secondary | ICD-10-CM | POA: Diagnosis not present

## 2016-09-03 ENCOUNTER — Telehealth: Payer: Self-pay | Admitting: Family Medicine

## 2016-09-03 NOTE — Telephone Encounter (Signed)
Pt is getting another UTI and would like to have an antibiotic called. [facility] [address]. Please advise. Thanks! ep

## 2016-09-03 NOTE — Telephone Encounter (Signed)
Will forward to PCP.  Martin, Tamika L, RN  

## 2016-09-04 NOTE — Telephone Encounter (Signed)
Attempted to call patient.  Will be seeing her in clinic tomorrow.  Will discuss this issue at that point.

## 2016-09-05 ENCOUNTER — Encounter: Payer: Self-pay | Admitting: Family Medicine

## 2016-09-05 ENCOUNTER — Ambulatory Visit (INDEPENDENT_AMBULATORY_CARE_PROVIDER_SITE_OTHER): Payer: Medicare Other | Admitting: Family Medicine

## 2016-09-05 DIAGNOSIS — G2581 Restless legs syndrome: Secondary | ICD-10-CM | POA: Diagnosis not present

## 2016-09-05 DIAGNOSIS — G4700 Insomnia, unspecified: Secondary | ICD-10-CM | POA: Diagnosis not present

## 2016-09-05 MED ORDER — TRAZODONE HCL 50 MG PO TABS: 50.0000 mg | ORAL_TABLET | Freq: Every evening | ORAL | 3 refills | Status: DC | PRN

## 2016-09-05 MED ORDER — GABAPENTIN 300 MG PO CAPS: 300.0000 mg | ORAL_CAPSULE | Freq: Once | ORAL | 0 refills | Status: DC

## 2016-09-05 NOTE — Patient Instructions (Addendum)
Today we talked about restless legs and trouble sleeping.  I have prescribed you gabapentin that you can take once per day before bed if needed.  If you do decide to take this medicine you cannot take trazodone because these two medicines can interact and decrease your blood pressure.   General tips for better sleep hygiene:  1. The bedroom is for sleeping-- no TVS 2. No screens 1hr before bedtime 3. No alcohol before bed  Please follow up in three weeks to discuss how your medicine is working.  Martha Mora L. Rosalyn Gess, [place] Medicine Resident PGY-1 09/05/2016 5:01 PM

## 2016-09-08 DIAGNOSIS — G4700 Insomnia, unspecified: Secondary | ICD-10-CM | POA: Insufficient documentation

## 2016-09-08 NOTE — Assessment & Plan Note (Addendum)
Patient is elderly and takes trazodone and tramadol and I was concerned to prescribe any of the mainstay RLS medications with concurrent use of these. Educated patient on using these medications along with gabapentin and stated that if she is to use gabapentin she cannot use these in the same night. - gabapentin 300mg  before bed PRN - follow up in 3 weeks

## 2016-09-08 NOTE — Progress Notes (Signed)
    Subjective:  Martha Mora is a 70 y.o. female who presents to the [facility] today with a chief complaint of difficulty sleeping and restless legs  HPI:  Restless legs: Patient has restless legs every day and this has been an ongoing problem. Dr. Teryl Lucy has previously prescribed her requip 0.5mg  that helped briefly and then this was increased to 0.75mg  and finally to 1mg  qhs. Stated that medicine made her feel "weird" and "loopy" and had since stopped.  This is causing her significant distress and she has trouble sleeping every night.  Denies any trauma, numbness or tingling.  She is ready to try another medication.    Insomnia:  Endorses trouble sleeping nearly every night.  Has been taking extra tramadol to sleep lately.  Is prescribed trazodone, but states that is doesn't work.  RLS is definitely contributing, but this has been an issue for her long before this.  Asked about valium and or ambien.  Patient denies any alcohol use, but does watch TV every night prior to bed.   PMH: tobacco use, anxiety, depression, HTN ROS: see HPI  Objective:  Physical Exam: BP (!) 118/52   Pulse 68   Temp 98.7 F (37.1 C) (Oral)   Ht 5\' 2"  (1.575 m)   Wt 200 lb 12.8 oz (91.1 kg)   SpO2 95%   BMI 36.73 kg/m   Gen: 70 yo F NAD, resting comfortably CV: RRR with no murmurs appreciated Pulm: NWOB, CTAB with no crackles, wheezes, or rhonchi GI: Normal bowel sounds present. Soft, Nontender, Nondistended. MSK: no edema, cyanosis, or clubbing noted Skin: warm, dry Neuro: grossly normal, moves all extremities Psych: Normal affect and thought content  No results found for this or any previous visit (from the past 72 hour(s)).   Assessment/Plan:  Restless leg syndrome Patient is elderly and takes trazodone and tramadol and I was concerned to prescribe any of the mainstay RLS medications with concurrent use of these. Educated patient on using these medications along with gabapentin and stated that if she is  to use gabapentin she cannot use these in the same night. - gabapentin 300mg  before bed PRN - follow up in 3 weeks   Insomnia Discussed proper sleep hygiene and recommended no TVS in bedroom, no alcohol prior to bed and no screen time at least 1 hour prior to bed.  Prescribed 300mg  gabapentin to help with RLS.  - follow up in 3 weeks

## 2016-09-08 NOTE — Assessment & Plan Note (Signed)
Discussed proper sleep hygiene and recommended no TVS in bedroom, no alcohol prior to bed and no screen time at least 1 hour prior to bed.  Prescribed 300mg  gabapentin to help with RLS.  - follow up in 3 weeks

## 2016-09-10 ENCOUNTER — Telehealth: Payer: Self-pay | Admitting: Family Medicine

## 2016-09-10 NOTE — Telephone Encounter (Signed)
Bladder infection is back with a vengeance with some tinges of blood.  She needs an antibiotic for seven days.  [facility] [address]

## 2016-09-11 ENCOUNTER — Other Ambulatory Visit: Payer: Self-pay | Admitting: Family Medicine

## 2016-09-11 DIAGNOSIS — N390 Urinary tract infection, site not specified: Secondary | ICD-10-CM

## 2016-09-11 MED ORDER — CEPHALEXIN 500 MG PO CAPS: 500.0000 mg | ORAL_CAPSULE | Freq: Two times a day (BID) | ORAL | 0 refills | Status: AC

## 2016-09-11 NOTE — Progress Notes (Signed)
Sent in prescription for keflex 500mg  BID x7 days.   Thanks, Dr. Rosalyn Gess

## 2016-09-11 NOTE — Telephone Encounter (Signed)
Called in abx for patient.  Keflex 500mg  BID x7 days.   Dr. Rosalyn Gess-

## 2016-10-01 DIAGNOSIS — G4733 Obstructive sleep apnea (adult) (pediatric): Secondary | ICD-10-CM | POA: Diagnosis not present

## 2016-10-29 ENCOUNTER — Telehealth: Payer: Self-pay | Admitting: Family Medicine

## 2016-10-29 DIAGNOSIS — L720 Epidermal cyst: Secondary | ICD-10-CM | POA: Diagnosis not present

## 2016-10-29 DIAGNOSIS — L820 Inflamed seborrheic keratosis: Secondary | ICD-10-CM | POA: Diagnosis not present

## 2016-10-29 NOTE — Telephone Encounter (Signed)
Will forward to PCP.  Lajoy Vanamburg L, RN  

## 2016-10-29 NOTE — Telephone Encounter (Signed)
Pt would like something other than Myrbetriq, something less expensive. Mybertriq was $45. Please advise. Thanks! ep

## 2016-10-30 NOTE — Telephone Encounter (Signed)
2nd request. Please advise. Thanks! ep °

## 2016-10-31 ENCOUNTER — Encounter: Payer: Self-pay | Admitting: Pulmonary Disease

## 2016-10-31 ENCOUNTER — Ambulatory Visit (INDEPENDENT_AMBULATORY_CARE_PROVIDER_SITE_OTHER): Payer: Medicare Other | Admitting: Pulmonary Disease

## 2016-10-31 ENCOUNTER — Other Ambulatory Visit: Payer: Self-pay | Admitting: Family Medicine

## 2016-10-31 VITALS — BP 144/70 | HR 70 | Ht 62.0 in | Wt 207.0 lb

## 2016-10-31 DIAGNOSIS — G4733 Obstructive sleep apnea (adult) (pediatric): Secondary | ICD-10-CM | POA: Diagnosis not present

## 2016-10-31 DIAGNOSIS — F17200 Nicotine dependence, unspecified, uncomplicated: Secondary | ICD-10-CM

## 2016-10-31 DIAGNOSIS — R0602 Shortness of breath: Secondary | ICD-10-CM | POA: Diagnosis not present

## 2016-10-31 DIAGNOSIS — F17210 Nicotine dependence, cigarettes, uncomplicated: Secondary | ICD-10-CM

## 2016-10-31 DIAGNOSIS — Z9989 Dependence on other enabling machines and devices: Secondary | ICD-10-CM

## 2016-10-31 MED ORDER — OXYBUTYNIN CHLORIDE ER 10 MG PO TB24: 10.0000 mg | ORAL_TABLET | Freq: Every day | ORAL | 0 refills | Status: DC
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
## 2016-10-31 NOTE — Telephone Encounter (Signed)
Sent in prescription for Oxybutynin 10mg  ER.   Thanks, Linna Hoff

## 2016-10-31 NOTE — Patient Instructions (Signed)
   Call me if you have any new breathing problems before your next appointment.  I will see you back in 6 weeks to review your test results or sooner if needed.  I'm referring you to our lung cancer screening coordinator to discuss testing.  TESTS ORDERED: 1. FULL PFTs before follow-up 2. 6MWT on room air before follow-up

## 2016-11-14 ENCOUNTER — Telehealth: Payer: Self-pay | Admitting: Family Medicine

## 2016-11-14 NOTE — Telephone Encounter (Signed)
Pt is calling because the medication that she was given for an overactive bladder is not strong enough. She is wondering should she increase the dosage or the doctor want to call in something else. Please let patient know what to do. jw

## 2016-11-17 NOTE — Telephone Encounter (Signed)
Attempted to call patient and there was no answer.  Patient can try 15mg  daily.  I will attempt to call again.

## 2016-11-21 ENCOUNTER — Other Ambulatory Visit: Payer: Self-pay | Admitting: Family Medicine

## 2016-11-21 NOTE — Telephone Encounter (Signed)
Pt was advised. ep °

## 2016-11-21 NOTE — Telephone Encounter (Signed)
Pt needs a refill on over active bladder medication sent to [facility] on [address]. ep

## 2016-11-28 ENCOUNTER — Other Ambulatory Visit: Payer: Self-pay | Admitting: *Deleted

## 2016-11-28 MED ORDER — TRAZODONE HCL 50 MG PO TABS: 50.0000 mg | ORAL_TABLET | Freq: Every evening | ORAL | 3 refills | Status: DC | PRN

## 2016-11-28 NOTE — Telephone Encounter (Signed)
Refill request for 90 day supply.  Emmet Messer L, RN  

## 2016-12-11 DIAGNOSIS — I10 Essential (primary) hypertension: Secondary | ICD-10-CM | POA: Diagnosis not present

## 2016-12-11 DIAGNOSIS — I2510 Atherosclerotic heart disease of native coronary artery without angina pectoris: Secondary | ICD-10-CM | POA: Diagnosis not present

## 2016-12-11 DIAGNOSIS — J449 Chronic obstructive pulmonary disease, unspecified: Secondary | ICD-10-CM | POA: Diagnosis not present

## 2016-12-13 ENCOUNTER — Telehealth: Payer: Self-pay | Admitting: Pulmonary Disease

## 2016-12-13 DIAGNOSIS — Z87891 Personal history of nicotine dependence: Secondary | ICD-10-CM

## 2016-12-16 NOTE — Telephone Encounter (Signed)
Will forward to lung nodule pool.  

## 2016-12-18 DIAGNOSIS — E559 Vitamin D deficiency, unspecified: Secondary | ICD-10-CM | POA: Diagnosis not present

## 2016-12-18 DIAGNOSIS — D649 Anemia, unspecified: Secondary | ICD-10-CM | POA: Diagnosis not present

## 2016-12-18 DIAGNOSIS — E876 Hypokalemia: Secondary | ICD-10-CM | POA: Diagnosis not present

## 2016-12-18 DIAGNOSIS — E785 Hyperlipidemia, unspecified: Secondary | ICD-10-CM | POA: Diagnosis not present

## 2016-12-23 ENCOUNTER — Encounter: Payer: Self-pay | Admitting: Internal Medicine

## 2016-12-25 ENCOUNTER — Ambulatory Visit (INDEPENDENT_AMBULATORY_CARE_PROVIDER_SITE_OTHER): Payer: Medicare Other | Admitting: Pulmonary Disease

## 2016-12-25 ENCOUNTER — Encounter: Payer: Self-pay | Admitting: Pulmonary Disease

## 2016-12-25 VITALS — BP 120/62 | HR 71 | Ht 62.0 in | Wt 217.2 lb

## 2016-12-25 DIAGNOSIS — G4733 Obstructive sleep apnea (adult) (pediatric): Secondary | ICD-10-CM | POA: Diagnosis not present

## 2016-12-25 DIAGNOSIS — R0602 Shortness of breath: Secondary | ICD-10-CM

## 2016-12-25 DIAGNOSIS — Z9989 Dependence on other enabling machines and devices: Secondary | ICD-10-CM | POA: Diagnosis not present

## 2016-12-25 LAB — PULMONARY FUNCTION TEST
DL/VA % PRED: 91 %
DL/VA: 4.07 ml/min/mmHg/L
DLCO cor % pred: 73 %
DLCO cor: 15.32 ml/min/mmHg
DLCO unc % pred: 74 %
DLCO unc: 15.59 ml/min/mmHg
FEF 25-75 PRE: 1.96 L/s
FEF 25-75 Post: 1.73 L/sec
FEF2575-%CHANGE-POST: -11 %
FEF2575-%PRED-POST: 107 %
FEF2575-%Pred-Pre: 121 %
FEV1-%CHANGE-POST: -2 %
FEV1-%PRED-PRE: 101 %
FEV1-%Pred-Post: 99 %
FEV1-PRE: 1.97 L
FEV1-Post: 1.92 L
FEV1FVC-%Change-Post: 0 %
FEV1FVC-%PRED-PRE: 108 %
FEV6-%Change-Post: -3 %
FEV6-%PRED-POST: 95 %
FEV6-%Pred-Pre: 98 %
FEV6-POST: 2.35 L
FEV6-Pre: 2.43 L
FEV6FVC-%PRED-PRE: 105 %
FEV6FVC-%Pred-Post: 105 %
FVC-%Change-Post: -3 %
FVC-%PRED-PRE: 93 %
FVC-%Pred-Post: 90 %
FVC-POST: 2.35 L
FVC-PRE: 2.43 L
POST FEV1/FVC RATIO: 82 %
PRE FEV6/FVC RATIO: 100 %
Post FEV6/FVC ratio: 100 %
Pre FEV1/FVC ratio: 81 %
RV % PRED: 109 %
RV: 2.32 L
TLC % pred: 103 %
TLC: 4.85 L

## 2016-12-25 NOTE — Progress Notes (Signed)
Test reviewed.  
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
## 2016-12-25 NOTE — Telephone Encounter (Signed)
Spoke with pt and scheduled SDMV for 01/01/17 at 10:30 CT ordered Nothing further needed

## 2016-12-25 NOTE — Patient Instructions (Signed)
   Please try to keep track of your calories and limit yourself to a 1200 calorie healthy diet.  Try to gradually increasing your walking & exercise.  Call me if you have any new breathing problems before your next appointment.

## 2016-12-27 DIAGNOSIS — L570 Actinic keratosis: Secondary | ICD-10-CM | POA: Diagnosis not present

## 2016-12-27 DIAGNOSIS — L820 Inflamed seborrheic keratosis: Secondary | ICD-10-CM | POA: Diagnosis not present

## 2016-12-27 DIAGNOSIS — L821 Other seborrheic keratosis: Secondary | ICD-10-CM | POA: Diagnosis not present

## 2017-01-01 ENCOUNTER — Encounter: Payer: Self-pay | Admitting: Acute Care

## 2017-01-01 ENCOUNTER — Ambulatory Visit (INDEPENDENT_AMBULATORY_CARE_PROVIDER_SITE_OTHER): Payer: Medicare Other | Admitting: Acute Care

## 2017-01-01 ENCOUNTER — Ambulatory Visit (INDEPENDENT_AMBULATORY_CARE_PROVIDER_SITE_OTHER)
Admission: RE | Admit: 2017-01-01 | Discharge: 2017-01-01 | Disposition: A | Discharge status: Home / Self Care | Payer: Medicare Other | Source: Ambulatory Visit | Attending: Acute Care | Admitting: Acute Care

## 2017-01-01 DIAGNOSIS — Z87891 Personal history of nicotine dependence: Secondary | ICD-10-CM | POA: Diagnosis not present

## 2017-01-01 NOTE — Progress Notes (Signed)
Shared Decision Making Visit Lung Cancer Screening Program [phone])   Eligibility:  Age 71 y.o.  Pack Years Smoking History Calculation 70-pack-year smoking history (# packs/per year x # years smoked)  Recent History of coughing up blood  No  Unexplained weight loss? no ( >Than 15 pounds within the last 6 months )  Prior History Lung / other cancer no (Diagnosis within the last 5 years already requiring surveillance chest CT Scans).  Smoking Status Former Smoker  Former Smokers: Years since quit: < 1 year  Quit Date: 12/14/2016  Visit Components:  Discussion included one or more decision making aids. yes  Discussion included risk/benefits of screening. yes  Discussion included potential follow up diagnostic testing for abnormal scans. yes  Discussion included meaning and risk of over diagnosis. yes  Discussion included meaning and risk of False Positives. yes  Discussion included meaning of total radiation exposure. yes  Counseling Included:  Importance of adherence to annual lung cancer LDCT screening. yes  Impact of comorbidities on ability to participate in the program. yes  Ability and willingness to under diagnostic treatment. yes  Smoking Cessation Counseling:  Current Smokers:   Discussed importance of maintaining smoking cessation. yes  Information about tobacco cessation classes and interventions provided to patient. yes  Patient provided with "ticket" for LDCT Scan. yes  Symptomatic Patient. no  Counseling: NA  Diagnosis Code: Tobacco Use Z72.0  Asymptomatic Patient yes  Counseling (Intermediate counseling: > three minutes counseling) [id number]  Former Smokers:   Discussed the importance of maintaining cigarette abstinence. yes  Diagnosis Code: Personal History of Nicotine Dependence. [id number]  Information about tobacco cessation classes and interventions provided to patient. Yes  Patient provided with "ticket" for LDCT Scan. yes  Written  Order for Lung Cancer Screening with LDCT placed in Epic. Yes (CT Chest Lung Cancer Screening Low Dose W/O CM) [id number] Z12.2-Screening of respiratory organs Z87.891-Personal history of nicotine dependence  I spent 15 minutes of face to face time with Martha Mora discussing the risks and benefits of lung cancer screening. We viewed a power point together that explained in detail the above noted topics. We took the time to pause the power point at intervals to allow for questions to be asked and answered to ensure understanding. We discussed that she had taken the single most powerful action possible to decrease her risk of developing lung cancer when she quit smoking. I counseled her to remain smoke free, and to contact me if she ever had the desire to smoke again so that I can provide resources and tools to help support the effort to remain smoke free. We discussed the time and location of the scan, and that either [facility] or I will call with the results within  24-48 hours of receiving them. She has my card and contact information in the event she needs to speak with me, in addition to a copy of the power point we reviewed as a resource. Martha Mora verbalized understanding of all of the above and had no further questions upon leaving the office.   I spent 45 minutes counseling patient to remain smoke free and to abstain from use of tobacco products.  We discussed that there is a high incidence of coronary artery disease noted on these scans. We discussed that this is a non-gated exam therefore degree or severity cannot be determined. The patient has a personal history of coronary artery disease and has had stent placement 4-5. She is followed by cardiologist.  Magdalen Spatz, NP 01/01/2017

## 2017-01-03 ENCOUNTER — Telehealth: Payer: Self-pay | Admitting: Acute Care

## 2017-01-03 DIAGNOSIS — Z87891 Personal history of nicotine dependence: Secondary | ICD-10-CM

## 2017-01-03 NOTE — Telephone Encounter (Signed)
Pt is requesting CT results from 01-01-17.  Will route to lung nodule pool to f/u on.

## 2017-01-03 NOTE — Telephone Encounter (Signed)
These results have been called to the patient who verbalized understanding. I explained her scan was read as a Lung RADS 2: nodules that are benign in appearance and behavior with a very low likelihood of becoming a clinically active cancer due to size or lack of growth. Recommendation per radiology is for a repeat LDCT in 12 months. We will order and schedule scan for February 2019. We also discussed the fact that there was notation of age advanced coronary artery disease. Patient is already aware of coronary artery disease history. He is on statin therapy, and has had 3-4 stents placed in the past. She verbalized understanding of results and had no further questions at completion of the call.

## 2017-01-03 NOTE — Telephone Encounter (Signed)
Martha Mora, please advise on CT results from 01/01/17.

## 2017-01-15 DIAGNOSIS — M47816 Spondylosis without myelopathy or radiculopathy, lumbar region: Secondary | ICD-10-CM | POA: Diagnosis not present

## 2017-01-31 ENCOUNTER — Other Ambulatory Visit: Payer: Self-pay | Admitting: *Deleted

## 2017-02-03 ENCOUNTER — Ambulatory Visit: Payer: Medicare Other | Admitting: Hematology

## 2017-02-03 ENCOUNTER — Ambulatory Visit: Payer: Medicare Other

## 2017-02-03 ENCOUNTER — Other Ambulatory Visit: Payer: Medicare Other

## 2017-02-03 ENCOUNTER — Telehealth: Payer: Self-pay | Admitting: Hematology

## 2017-02-03 NOTE — Telephone Encounter (Signed)
Patient call and said that her husband had some health issues and she forgot about her appointments and needs to reschedule them

## 2017-02-04 ENCOUNTER — Telehealth: Payer: Self-pay | Admitting: Hematology

## 2017-02-04 NOTE — Telephone Encounter (Signed)
Called patient to inform her of next scheduled appointments. Rescheduled 3.19.18  appointments to 3.21.18. LVM Patient called to reschedule appointments per her husband had some health issues and she forgot about her appointments and needed to rescheduled them.

## 2017-02-05 ENCOUNTER — Encounter: Payer: Self-pay | Admitting: Hematology

## 2017-02-05 ENCOUNTER — Ambulatory Visit (HOSPITAL_BASED_OUTPATIENT_CLINIC_OR_DEPARTMENT_OTHER): Payer: Medicare Other | Admitting: Hematology

## 2017-02-05 ENCOUNTER — Telehealth: Payer: Self-pay | Admitting: Hematology

## 2017-02-05 ENCOUNTER — Ambulatory Visit (HOSPITAL_BASED_OUTPATIENT_CLINIC_OR_DEPARTMENT_OTHER): Payer: Medicare Other

## 2017-02-05 ENCOUNTER — Other Ambulatory Visit (HOSPITAL_BASED_OUTPATIENT_CLINIC_OR_DEPARTMENT_OTHER): Payer: Medicare Other

## 2017-02-05 VITALS — BP 134/50 | HR 57 | Temp 99.0°F | Resp 18 | Ht 62.0 in | Wt 213.7 lb

## 2017-02-05 DIAGNOSIS — M8580 Other specified disorders of bone density and structure, unspecified site: Secondary | ICD-10-CM

## 2017-02-05 DIAGNOSIS — Z170 Estrogen receptor positive status [ER+]: Principal | ICD-10-CM

## 2017-02-05 DIAGNOSIS — C50911 Malignant neoplasm of unspecified site of right female breast: Secondary | ICD-10-CM

## 2017-02-05 DIAGNOSIS — I10 Essential (primary) hypertension: Secondary | ICD-10-CM | POA: Diagnosis not present

## 2017-02-05 DIAGNOSIS — C50511 Malignant neoplasm of lower-outer quadrant of right female breast: Secondary | ICD-10-CM

## 2017-02-05 LAB — COMPREHENSIVE METABOLIC PANEL
ALT: 21 U/L (ref 0–55)
AST: 40 U/L — AB (ref 5–34)
Albumin: 3.3 g/dL — ABNORMAL LOW (ref 3.5–5.0)
Alkaline Phosphatase: 59 U/L (ref 40–150)
Anion Gap: 10 mEq/L (ref 3–11)
BUN: 14.4 mg/dL (ref 7.0–26.0)
CALCIUM: 9.4 mg/dL (ref 8.4–10.4)
CHLORIDE: 108 meq/L (ref 98–109)
CO2: 23 meq/L (ref 22–29)
Creatinine: 0.8 mg/dL (ref 0.6–1.1)
EGFR: 70 mL/min/{1.73_m2} — ABNORMAL LOW (ref >90)
Glucose: 120 mg/dl (ref 70–140)
POTASSIUM: 4.1 meq/L (ref 3.5–5.1)
Sodium: 140 mEq/L (ref 136–145)
Total Bilirubin: 0.46 mg/dL (ref 0.20–1.20)
Total Protein: 5.9 g/dL — ABNORMAL LOW (ref 6.4–8.3)

## 2017-02-05 LAB — CBC WITH DIFFERENTIAL/PLATELET
BASO%: 1.1 % (ref 0.0–2.0)
BASOS ABS: 0.1 10*3/uL (ref 0.0–0.1)
EOS%: 6.1 % (ref 0.0–7.0)
Eosinophils Absolute: 0.6 10*3/uL — ABNORMAL HIGH (ref 0.0–0.5)
HEMATOCRIT: 38.7 % (ref 34.8–46.6)
HGB: 12.8 g/dL (ref 11.6–15.9)
LYMPH%: 21 % (ref 14.0–49.7)
MCH: 29.9 pg (ref 25.1–34.0)
MCHC: 33 g/dL (ref 31.5–36.0)
MCV: 90.4 fL (ref 79.5–101.0)
MONO#: 0.7 10*3/uL (ref 0.1–0.9)
MONO%: 7 % (ref 0.0–14.0)
NEUT#: 6.4 10*3/uL (ref 1.5–6.5)
NEUT%: 64.8 % (ref 38.4–76.8)
Platelets: 250 10*3/uL (ref 145–400)
RBC: 4.29 10*6/uL (ref 3.70–5.45)
RDW: 14.3 % (ref 11.2–14.5)
WBC: 9.9 10*3/uL (ref 3.9–10.3)
lymph#: 2.1 10*3/uL (ref 0.9–3.3)

## 2017-02-05 MED ORDER — DENOSUMAB 60 MG/ML ~~LOC~~ SOLN
60.0000 mg | Freq: Once | SUBCUTANEOUS | Status: AC
  Administered 2017-02-05: 60 mg via SUBCUTANEOUS
  Filled 2017-02-05: qty 1
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
## 2017-02-05 NOTE — Telephone Encounter (Signed)
Per patient, she will call [facility] to schedule her Mammogram, per 02/05/17 los.

## 2017-02-05 NOTE — Telephone Encounter (Signed)
Appointments scheduled per 02/05/17 los. Patient was given a copy of the AVS report and appointment schedule, per 02/05/17 los. °

## 2017-02-05 NOTE — Patient Instructions (Signed)
Denosumab injection What is this medicine? DENOSUMAB (den oh sue mab) slows bone breakdown. Prolia is used to treat osteoporosis in women after menopause and in men. Xgeva is used to prevent bone fractures and other bone problems caused by cancer bone metastases. Xgeva is also used to treat giant cell tumor of the bone. This medicine may be used for other purposes; ask your health care provider or pharmacist if you have questions. What should I tell my health care provider before I take this medicine? They need to know if you have any of these conditions: -dental disease -eczema -infection or history of infections -kidney disease or on dialysis -low blood calcium or vitamin D -malabsorption syndrome -scheduled to have surgery or tooth extraction -taking medicine that contains denosumab -thyroid or parathyroid disease -an unusual reaction to denosumab, other medicines, foods, dyes, or preservatives -pregnant or trying to get pregnant -breast-feeding How should I use this medicine? This medicine is for injection under the skin. It is given by a health care professional in a hospital or clinic setting. If you are getting Prolia, a special MedGuide will be given to you by the pharmacist with each prescription and refill. Be sure to read this information carefully each time. For Prolia, talk to your pediatrician regarding the use of this medicine in children. Special care may be needed. For Xgeva, talk to your pediatrician regarding the use of this medicine in children. While this drug may be prescribed for children as young as 13 years for selected conditions, precautions do apply. Overdosage: If you think you have taken too much of this medicine contact a poison control center or emergency room at once. NOTE: This medicine is only for you. Do not share this medicine with others. What if I miss a dose? It is important not to miss your dose. Call your doctor or health care professional if you are  unable to keep an appointment. What may interact with this medicine? Do not take this medicine with any of the following medications: -other medicines containing denosumab This medicine may also interact with the following medications: -medicines that suppress the immune system -medicines that treat cancer -steroid medicines like prednisone or cortisone This list may not describe all possible interactions. Give your health care provider a list of all the medicines, herbs, non-prescription drugs, or dietary supplements you use. Also tell them if you smoke, drink alcohol, or use illegal drugs. Some items may interact with your medicine. What should I watch for while using this medicine? Visit your doctor or health care professional for regular checks on your progress. Your doctor or health care professional may order blood tests and other tests to see how you are doing. Call your doctor or health care professional if you get a cold or other infection while receiving this medicine. Do not treat yourself. This medicine may decrease your body's ability to fight infection. You should make sure you get enough calcium and vitamin D while you are taking this medicine, unless your doctor tells you not to. Discuss the foods you eat and the vitamins you take with your health care professional. See your dentist regularly. Brush and floss your teeth as directed. Before you have any dental work done, tell your dentist you are receiving this medicine. Do not become pregnant while taking this medicine or for 5 months after stopping it. Women should inform their doctor if they wish to become pregnant or think they might be pregnant. There is a potential for serious side effects   to an unborn child. Talk to your health care professional or pharmacist for more information. What side effects may I notice from receiving this medicine? Side effects that you should report to your doctor or health care professional as soon as  possible: -allergic reactions like skin rash, itching or hives, swelling of the face, lips, or tongue -breathing problems -chest pain -fast, irregular heartbeat -feeling faint or lightheaded, falls -fever, chills, or any other sign of infection -muscle spasms, tightening, or twitches -numbness or tingling -skin blisters or bumps, or is dry, peels, or red -slow healing or unexplained pain in the mouth or jaw -unusual bleeding or bruising Side effects that usually do not require medical attention (Report these to your doctor or health care professional if they continue or are bothersome.): -muscle pain -stomach upset, gas This list may not describe all possible side effects. Call your doctor for medical advice about side effects. You may report side effects to FDA at 1-800-FDA-1088. Where should I keep my medicine? This medicine is only given in a clinic, doctor's office, or other health care setting and will not be stored at home. NOTE: This sheet is a summary. It may not cover all possible information. If you have questions about this medicine, talk to your doctor, pharmacist, or health care provider.    2016, Elsevier/Gold Standard. (2012-05-04 12:37:47) Influenza Virus Vaccine (Flucelvax) What is this medicine? INFLUENZA VIRUS VACCINE (in floo EN zuh VAHY ruhs vak SEEN) helps to reduce the risk of getting influenza also known as the flu. The vaccine only helps protect you against some strains of the flu. This medicine may be used for other purposes; ask your health care provider or pharmacist if you have questions. What should I tell my health care provider before I take this medicine? They need to know if you have any of these conditions: -bleeding disorder like hemophilia -fever or infection -Guillain-Barre syndrome or other neurological problems -immune system problems -infection with the human immunodeficiency virus (HIV) or AIDS -low blood platelet counts -multiple  sclerosis -an unusual or allergic reaction to influenza virus vaccine, other medicines, foods, dyes or preservatives -pregnant or trying to get pregnant -breast-feeding How should I use this medicine? This vaccine is for injection into a muscle. It is given by a health care professional. A copy of Vaccine Information Statements will be given before each vaccination. Read this sheet carefully each time. The sheet may change frequently. Talk to your pediatrician regarding the use of this medicine in children. Special care may be needed. Overdosage: If you think you've taken too much of this medicine contact a poison control center or emergency room at once. Overdosage: If you think you have taken too much of this medicine contact a poison control center or emergency room at once. NOTE: This medicine is only for you. Do not share this medicine with others. What if I miss a dose? This does not apply. What may interact with this medicine? -chemotherapy or radiation therapy -medicines that lower your immune system like etanercept, anakinra, infliximab, and adalimumab -medicines that treat or prevent blood clots like warfarin -phenytoin -steroid medicines like prednisone or cortisone -theophylline -vaccines This list may not describe all possible interactions. Give your health care provider a list of all the medicines, herbs, non-prescription drugs, or dietary supplements you use. Also tell them if you smoke, drink alcohol, or use illegal drugs. Some items may interact with your medicine. What should I watch for while using this medicine? Report any side effects   that do not go away within 3 days to your doctor or health care professional. Call your health care provider if any unusual symptoms occur within 6 weeks of receiving this vaccine. You may still catch the flu, but the illness is not usually as bad. You cannot get the flu from the vaccine. The vaccine will not protect against colds or other  illnesses that may cause fever. The vaccine is needed every year. What side effects may I notice from receiving this medicine? Side effects that you should report to your doctor or health care professional as soon as possible: -allergic reactions like skin rash, itching or hives, swelling of the face, lips, or tongue Side effects that usually do not require medical attention (Report these to your doctor or health care professional if they continue or are bothersome.): -fever -headache -muscle aches and pains -pain, tenderness, redness, or swelling at the injection site -tiredness This list may not describe all possible side effects. Call your doctor for medical advice about side effects. You may report side effects to FDA at 1-800-FDA-1088. Where should I keep my medicine? The vaccine will be given by a health care professional in a clinic, pharmacy, doctor's office, or other health care setting. You will not be given vaccine doses to store at home. NOTE: This sheet is a summary. It may not cover all possible information. If you have questions about this medicine, talk to your doctor, pharmacist, or health care provider.    2016, Elsevier/Gold Standard. (2011-10-16 14:06:47)  

## 2017-02-06 DIAGNOSIS — M47816 Spondylosis without myelopathy or radiculopathy, lumbar region: Secondary | ICD-10-CM | POA: Diagnosis not present

## 2017-02-18 ENCOUNTER — Other Ambulatory Visit: Payer: Self-pay | Admitting: Family Medicine

## 2017-02-19 ENCOUNTER — Other Ambulatory Visit: Payer: Self-pay | Admitting: *Deleted

## 2017-02-19 MED ORDER — TRAZODONE HCL 50 MG PO TABS: 50.0000 mg | ORAL_TABLET | Freq: Every evening | ORAL | 3 refills | Status: DC | PRN

## 2017-03-04 ENCOUNTER — Ambulatory Visit: Payer: Medicare Other | Admitting: Family Medicine

## 2017-03-06 ENCOUNTER — Other Ambulatory Visit: Payer: Self-pay | Admitting: Hematology

## 2017-03-06 DIAGNOSIS — C50911 Malignant neoplasm of unspecified site of right female breast: Secondary | ICD-10-CM

## 2017-03-07 ENCOUNTER — Other Ambulatory Visit: Payer: Self-pay | Admitting: Family Medicine

## 2017-03-12 DIAGNOSIS — I10 Essential (primary) hypertension: Secondary | ICD-10-CM | POA: Diagnosis not present

## 2017-03-12 DIAGNOSIS — J449 Chronic obstructive pulmonary disease, unspecified: Secondary | ICD-10-CM | POA: Diagnosis not present

## 2017-03-12 DIAGNOSIS — I2510 Atherosclerotic heart disease of native coronary artery without angina pectoris: Secondary | ICD-10-CM | POA: Diagnosis not present

## 2017-04-04 DIAGNOSIS — J029 Acute pharyngitis, unspecified: Secondary | ICD-10-CM | POA: Diagnosis not present

## 2017-04-07 ENCOUNTER — Ambulatory Visit: Payer: Medicare Other | Admitting: Internal Medicine

## 2017-04-08 DIAGNOSIS — J018 Other acute sinusitis: Secondary | ICD-10-CM | POA: Diagnosis not present

## 2017-04-09 DIAGNOSIS — M47816 Spondylosis without myelopathy or radiculopathy, lumbar region: Secondary | ICD-10-CM | POA: Diagnosis not present

## 2017-04-10 ENCOUNTER — Telehealth: Payer: Self-pay | Admitting: Family Medicine

## 2017-04-10 NOTE — Telephone Encounter (Signed)
Pt would like something cheaper that oxybutynin.  Her last RX cost $42.  It works but she needs to have something cheaper.  If an alternate is called in, please call it to [facility] [employer] [facility].  Please advise

## 2017-04-11 NOTE — Telephone Encounter (Signed)
[facility] and apparently tolteradine is >100 dollars per month.  Discussed with patient and she will continue with oxybutynin as this is currently the cheapest option.

## 2017-04-15 ENCOUNTER — Encounter: Payer: Self-pay | Admitting: Family Medicine

## 2017-04-15 ENCOUNTER — Ambulatory Visit (INDEPENDENT_AMBULATORY_CARE_PROVIDER_SITE_OTHER): Payer: Medicare Other | Admitting: Family Medicine

## 2017-04-15 VITALS — BP 150/86 | HR 68 | Temp 99.4°F | Ht 62.0 in | Wt 206.0 lb

## 2017-04-15 DIAGNOSIS — E611 Iron deficiency: Secondary | ICD-10-CM

## 2017-04-15 DIAGNOSIS — G2581 Restless legs syndrome: Secondary | ICD-10-CM | POA: Diagnosis not present

## 2017-04-15 DIAGNOSIS — R5383 Other fatigue: Secondary | ICD-10-CM

## 2017-04-15 LAB — POCT URINALYSIS DIP (MANUAL ENTRY)
Blood, UA: NEGATIVE
GLUCOSE UA: NEGATIVE mg/dL
NITRITE UA: POSITIVE — AB
Spec Grav, UA: >=1.03 — AB (ref 1.010–1.025)
UROBILINOGEN UA: 0.2 U/dL
pH, UA: 5.5 (ref 5.0–8.0)

## 2017-04-15 NOTE — Patient Instructions (Signed)
Cletis, you were seen today to discuss your RLS and I am checking your iron to see if you would be a good candidate for iron supplementation.  Additionally, I have stopped your oxybutynin due to the side effects that you described.   I will get in touch with you regarding your results and will start you on iron if appropriate.  Please follow up in three months.  Very nice seeing you today.  Cathan Gearin L. Rosalyn Gess, [place] Resident PGY-1 04/15/2017 2:44 PM

## 2017-04-15 NOTE — Progress Notes (Signed)
    Subjective:  Martha Mora is a 71 y.o. female who presents to the [facility] today with a chief complaint of restless legs   HPI:  Martha Mora is a 71yo F presenting with ongoing complaint of restless legs and also concerned about some nausea, flushing and fast heart rate after taking a dose of oxybutynin last night.  denied any chest pain, SOB, vomiting or diarrhea.     PMH: restless leg syndrome, GERD, stress incontinence Tobacco use: current smoker  Medication: reviewed and updated ROS: see HPI   Objective:  Physical Exam: BP (!) 150/86   Pulse 68   Temp 99.4 F (37.4 C) (Oral)   Ht 5\' 2"  (1.575 m)   Wt 206 lb (93.4 kg)   SpO2 98%   BMI 37.68 kg/m   Gen: 71 yo F in NAD, resting comfortably CV: RRR with no murmurs appreciated Pulm: NWOB, CTAB with no crackles, wheezes, or rhonchi GI: Normal bowel sounds present. Soft, Nontender, Nondistended. MSK: no edema, cyanosis, or clubbing noted Skin: warm, dry Neuro: grossly normal, moves all extremities Psych: Normal affect and thought content  Results for orders placed or performed in visit on 04/15/17 (from the past 72 hour(s))  Iron and TIBC     Status: None   Collection Time: 04/15/17  3:12 PM  Result Value Ref Range   Total Iron Binding Capacity 251 250 - 450 ug/dL   UIBC 189 118 - 369 ug/dL   Iron 62 27 - 139 ug/dL   Iron Saturation 25 15 - 55 %  Ferritin     Status: None   Collection Time: 04/15/17  3:12 PM  Result Value Ref Range   Ferritin 104 15 - 150 ng/mL  CBC     Status: Abnormal   Collection Time: 04/15/17  3:12 PM  Result Value Ref Range   WBC 13.4 (H) 3.4 - 10.8 x10E3/uL   RBC 4.35 3.77 - 5.28 x10E6/uL   Hemoglobin 13.1 11.1 - 15.9 g/dL   Hematocrit 39.6 34.0 - 46.6 %   MCV 91 79 - 97 fL   MCH 30.1 26.6 - 33.0 pg   MCHC 33.1 31.5 - 35.7 g/dL   RDW 14.7 12.3 - 15.4 %   Platelets 303 150 - 379 x10E3/uL     Assessment/Plan:  Restless leg syndrome Continued symptoms despite trying requip and gabapentin.  Iron levels WNL and normal hgb.   - will try lyrica 75mg  daily, 1 to 3 hours before bedtime; then increase to 150 mg daily on day 6. On or after day 11, dosage may be further increased to 300 mg daily.  - patient to follow up in three weeks and encouraged to call the offic e with concerns or questions  Medication side effect After reviewing common side effects of oxybutynin, symptoms of nausea, flushing and fast heart rate appear to be consistent with oxybutynin.  -discontinue medication and documented under allergies

## 2017-04-16 ENCOUNTER — Other Ambulatory Visit: Payer: Self-pay | Admitting: Family Medicine

## 2017-04-16 LAB — CBC
HEMOGLOBIN: 13.1 g/dL (ref 11.1–15.9)
Hematocrit: 39.6 % (ref 34.0–46.6)
MCH: 30.1 pg (ref 26.6–33.0)
MCHC: 33.1 g/dL (ref 31.5–35.7)
MCV: 91 fL (ref 79–97)
PLATELETS: 303 10*3/uL (ref 150–379)
RBC: 4.35 x10E6/uL (ref 3.77–5.28)
RDW: 14.7 % (ref 12.3–15.4)
WBC: 13.4 10*3/uL — AB (ref 3.4–10.8)

## 2017-04-16 LAB — IRON AND TIBC
IRON SATURATION: 25 % (ref 15–55)
IRON: 62 ug/dL (ref 27–139)
Total Iron Binding Capacity: 251 ug/dL (ref 250–450)
UIBC: 189 ug/dL (ref 118–369)

## 2017-04-16 LAB — FERRITIN: FERRITIN: 104 ng/mL (ref 15–150)

## 2017-04-16 NOTE — Progress Notes (Signed)
UA showed +nitrites and CBC mild leukocytosis and patient described some discomfort.  Called in keflex 500mg  QID x7 days. Also iron studies were normal and normal HGB.  Discussed finding with patient and prescribed lyrica for RLS.  Starting with 75mg  daily for first 5 days and will increase to 150mg  daily days 6-11 and can be increased to 300mg  daily if needed and medication is tolerated. Plan to have patient follow up in one month.

## 2017-04-17 DIAGNOSIS — I351 Nonrheumatic aortic (valve) insufficiency: Secondary | ICD-10-CM | POA: Diagnosis not present

## 2017-04-17 DIAGNOSIS — I2510 Atherosclerotic heart disease of native coronary artery without angina pectoris: Secondary | ICD-10-CM | POA: Diagnosis not present

## 2017-04-17 DIAGNOSIS — I340 Nonrheumatic mitral (valve) insufficiency: Secondary | ICD-10-CM | POA: Diagnosis not present

## 2017-04-17 DIAGNOSIS — I425 Other restrictive cardiomyopathy: Secondary | ICD-10-CM | POA: Diagnosis not present

## 2017-04-18 NOTE — Assessment & Plan Note (Signed)
Continued symptoms despite trying requip and gabapentin. Iron levels WNL and normal hgb.   - will try lyrica 75mg  daily, 1 to 3 hours before bedtime; then increase to 150 mg daily on day 6. On or after day 11, dosage may be further increased to 300 mg daily.  - patient to follow up in three weeks and encouraged to call the offic e with concerns or questions

## 2017-05-05 ENCOUNTER — Other Ambulatory Visit: Payer: Self-pay | Admitting: Family Medicine

## 2017-05-06 ENCOUNTER — Other Ambulatory Visit: Payer: Self-pay | Admitting: Hematology

## 2017-05-06 DIAGNOSIS — Z1231 Encounter for screening mammogram for malignant neoplasm of breast: Secondary | ICD-10-CM

## 2017-05-14 ENCOUNTER — Telehealth: Payer: Self-pay | Admitting: *Deleted

## 2017-05-14 ENCOUNTER — Other Ambulatory Visit: Payer: Self-pay | Admitting: Family Medicine

## 2017-05-14 NOTE — Telephone Encounter (Signed)
Received a fax refill request for Lyrica 75 mg.  Medication is not listed on current medication list.  Fax placed in provider box for review.  Derl Barrow, RN

## 2017-05-15 NOTE — Telephone Encounter (Signed)
Second request. L. Montgomery Favor, RN, BSN   

## 2017-05-16 ENCOUNTER — Other Ambulatory Visit: Payer: Self-pay | Admitting: Family Medicine

## 2017-05-16 MED ORDER — PREGABALIN 75 MG PO CAPS: 75.0000 mg | ORAL_CAPSULE | Freq: Every day | ORAL | 0 refills | Status: DC

## 2017-05-16 NOTE — Progress Notes (Signed)
lyric

## 2017-05-16 NOTE — Telephone Encounter (Signed)
Called Martha Mora to let her know that the reason she has not been able to fill her lyrica is because she needs a prior authorization. She did not answer and I was unable to leave a message because her inbox is full.  This will take some time unfortunately to be processed by insurance.   Dr. Rosalyn Gess

## 2017-05-16 NOTE — Telephone Encounter (Signed)
Pt is calling for a refill on her Lyrica. jw

## 2017-05-19 NOTE — Telephone Encounter (Signed)
PA for Lyrica faxed to [facility] for review.  Derl Barrow, RN

## 2017-05-19 NOTE — Telephone Encounter (Signed)
Has the prior authorization been completed? If so, it could take 24-72 hours for response. At times it could take up to 5 business days.  If PA has not been completed, the form will be placed in provider box for completion.  Derl Barrow, RN

## 2017-05-19 NOTE — Telephone Encounter (Signed)
Done! Thanks Tamika

## 2017-05-19 NOTE — Telephone Encounter (Signed)
Pt would like to know how long PA is going to take on her Lyrica. [facility] [address]. ep

## 2017-05-20 ENCOUNTER — Other Ambulatory Visit: Payer: Self-pay | Admitting: Family Medicine

## 2017-05-20 ENCOUNTER — Telehealth: Payer: Self-pay | Admitting: Family Medicine

## 2017-05-20 DIAGNOSIS — J018 Other acute sinusitis: Secondary | ICD-10-CM | POA: Diagnosis not present

## 2017-05-20 DIAGNOSIS — H1033 Unspecified acute conjunctivitis, bilateral: Secondary | ICD-10-CM | POA: Diagnosis not present

## 2017-05-20 MED ORDER — PREGABALIN 75 MG PO CAPS: 75.0000 mg | ORAL_CAPSULE | Freq: Every day | ORAL | 0 refills | Status: DC

## 2017-05-20 NOTE — Telephone Encounter (Signed)
Please call patient to let her know I placed a physical prescription for lyrica up front for her. I am not able to call this in. Thank you!  Ryder Man L. Rosalyn Gess, [place] Resident PGY-2 05/20/2017 1:46 PM

## 2017-05-20 NOTE — Telephone Encounter (Signed)
PA for Lyrica 75 mg approved via [facility] until 11/17/17.  Reference number: [id number].  Derl Barrow, RN

## 2017-05-20 NOTE — Telephone Encounter (Signed)
Left copy of prescription up front for patient.  Please call to let her know.   Thanks,  Cherly Anderson. Rosalyn Gess, [place] Resident PGY-2 05/20/2017 6:13 PM

## 2017-05-20 NOTE — Telephone Encounter (Signed)
Pt informed. She is on her way to pick up the Rx. Ottis Stain, CMA

## 2017-05-20 NOTE — Progress Notes (Signed)
Refilled lyrica.  

## 2017-05-20 NOTE — Telephone Encounter (Signed)
Pt called about her lyrica.  She is completely out.   It shows lyrica  Prior approval was sent to [facility] and [facility] shows No print. Pt would like to have [facility] since she is completely out.

## 2017-05-22 DIAGNOSIS — M47816 Spondylosis without myelopathy or radiculopathy, lumbar region: Secondary | ICD-10-CM | POA: Diagnosis not present

## 2017-05-22 NOTE — Telephone Encounter (Signed)
I informed pt of the Rx. Dr. Rosalyn Gess would you please update the med list. Please advise.  Ottis Stain, CMA

## 2017-05-22 NOTE — Telephone Encounter (Signed)
Fixed this for Ms. Munford.  I called in lyrica 75mg  TID with 3 refills for her.  Please call her to let her know.   Thanks so much,  Ousman Dise L. Rosalyn Gess, [place] Resident PGY-2 05/22/2017 12:31 PM

## 2017-05-22 NOTE — Telephone Encounter (Signed)
Pt called because her Lyrica was picked up but the problem is that this is written for 1 pill a day at bedtime, She has been taking 3 pills at bedtime since the original prescription said start out with one and then work up to 4 if needed. So she is taking three and this is working. The prescription we just sent over is 1 pill at bedtime and the qty is at 3. She  Will not have enough medication to get her through a whole month since she needs 90 qty. Can we send in a new prescription and let her know so that she go pick this up. jw

## 2017-05-22 NOTE — Telephone Encounter (Signed)
Will do. Thanks.

## 2017-05-22 NOTE — Telephone Encounter (Signed)
LVM for pt that Rx is at the desk for her to pick up. Ottis Stain, CMA

## 2017-05-23 ENCOUNTER — Other Ambulatory Visit: Payer: Self-pay | Admitting: Family Medicine

## 2017-05-23 MED ORDER — PREGABALIN 75 MG PO CAPS: 75.0000 mg | ORAL_CAPSULE | Freq: Three times a day (TID) | ORAL | 0 refills | Status: DC

## 2017-05-23 NOTE — Telephone Encounter (Signed)
She is unable to refill the Rx for the 3 pills per day because she is using th Rx for one pill a day.  The pharmacy will not fill it for her because it is a controlled substance.  Wonders if Dr Rosalyn Gess can call the pharmacy and straighten this out.  [facility]

## 2017-05-23 NOTE — Progress Notes (Signed)
Opened in error

## 2017-05-23 NOTE — Telephone Encounter (Signed)
Called [facility] and [facility]  [facility] and she can pick up the prescription.  She has enough to cover until then.  Thanks, Cherly Anderson. Rosalyn Gess, [place] Resident PGY-2 05/23/2017 12:29 PM

## 2017-05-29 DIAGNOSIS — H353122 Nonexudative age-related macular degeneration, left eye, intermediate dry stage: Secondary | ICD-10-CM | POA: Diagnosis not present

## 2017-05-29 DIAGNOSIS — H35722 Serous detachment of retinal pigment epithelium, left eye: Secondary | ICD-10-CM | POA: Diagnosis not present

## 2017-05-29 DIAGNOSIS — H3562 Retinal hemorrhage, left eye: Secondary | ICD-10-CM | POA: Diagnosis not present

## 2017-05-29 DIAGNOSIS — H353112 Nonexudative age-related macular degeneration, right eye, intermediate dry stage: Secondary | ICD-10-CM | POA: Diagnosis not present

## 2017-05-29 DIAGNOSIS — H353132 Nonexudative age-related macular degeneration, bilateral, intermediate dry stage: Secondary | ICD-10-CM | POA: Diagnosis not present

## 2017-06-05 ENCOUNTER — Encounter (INDEPENDENT_AMBULATORY_CARE_PROVIDER_SITE_OTHER): Payer: Self-pay | Admitting: Ophthalmology

## 2017-06-16 ENCOUNTER — Other Ambulatory Visit: Payer: Self-pay | Admitting: Hematology

## 2017-06-16 ENCOUNTER — Ambulatory Visit
Admission: RE | Admit: 2017-06-16 | Discharge: 2017-06-16 | Disposition: A | Discharge status: Home / Self Care | Payer: Medicare Other | Source: Ambulatory Visit | Attending: Hematology | Admitting: Hematology

## 2017-06-16 DIAGNOSIS — Z1231 Encounter for screening mammogram for malignant neoplasm of breast: Secondary | ICD-10-CM | POA: Diagnosis not present

## 2017-06-16 DIAGNOSIS — M8580 Other specified disorders of bone density and structure, unspecified site: Secondary | ICD-10-CM

## 2017-06-16 DIAGNOSIS — Z780 Asymptomatic menopausal state: Secondary | ICD-10-CM | POA: Diagnosis not present

## 2017-06-16 DIAGNOSIS — M85851 Other specified disorders of bone density and structure, right thigh: Secondary | ICD-10-CM | POA: Diagnosis not present

## 2017-06-16 HISTORY — DX: Personal history of irradiation: Z92.3

## 2017-06-19 DIAGNOSIS — M47816 Spondylosis without myelopathy or radiculopathy, lumbar region: Secondary | ICD-10-CM | POA: Diagnosis not present

## 2017-06-30 ENCOUNTER — Other Ambulatory Visit: Payer: Self-pay | Admitting: *Deleted

## 2017-06-30 ENCOUNTER — Telehealth: Payer: Self-pay

## 2017-06-30 NOTE — Telephone Encounter (Signed)
-----   Message from Eloise Levels, MD sent at 06/30/2017  9:14 AM EDT ----- Regarding: lyrica Contact: [phone] Please call patient and let her know that [facility].   Thanks, Daniel L. Rosalyn Gess, [place] Resident PGY-2 06/30/2017 9:15 AM

## 2017-06-30 NOTE — Telephone Encounter (Signed)
Has 2 days left of lyrica. States she received a letter from her insurance company stating her PCP does not have authority to refill lyrica and requesting someone who can. [facility] [address]. Hubbard Hartshorn, RN, BSN

## 2017-06-30 NOTE — Telephone Encounter (Signed)
Pt informed. [facility] is [facility] to make sure [facility] is ok. Will call us if there is a problem. Ottis Stain, CMA

## 2017-07-03 ENCOUNTER — Telehealth: Payer: Self-pay | Admitting: Family Medicine

## 2017-07-03 ENCOUNTER — Encounter (INDEPENDENT_AMBULATORY_CARE_PROVIDER_SITE_OTHER): Payer: Self-pay | Admitting: Ophthalmology

## 2017-07-03 NOTE — Telephone Encounter (Signed)
Pt states she does not want a colonoscopy, but wants the cologuard, the one you do at home. ep

## 2017-07-07 DIAGNOSIS — M47816 Spondylosis without myelopathy or radiculopathy, lumbar region: Secondary | ICD-10-CM | POA: Diagnosis not present
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
## 2017-07-10 ENCOUNTER — Ambulatory Visit (INDEPENDENT_AMBULATORY_CARE_PROVIDER_SITE_OTHER): Payer: Medicare Other | Admitting: Pulmonary Disease

## 2017-07-10 ENCOUNTER — Encounter: Payer: Self-pay | Admitting: Pulmonary Disease

## 2017-07-10 VITALS — BP 144/78 | HR 61 | Ht 62.0 in | Wt 214.0 lb

## 2017-07-10 DIAGNOSIS — F17200 Nicotine dependence, unspecified, uncomplicated: Secondary | ICD-10-CM

## 2017-07-10 DIAGNOSIS — Z23 Encounter for immunization: Secondary | ICD-10-CM | POA: Diagnosis not present

## 2017-07-10 DIAGNOSIS — R0609 Other forms of dyspnea: Secondary | ICD-10-CM | POA: Diagnosis not present

## 2017-07-10 NOTE — Patient Instructions (Addendum)
   Try using over the counter Loratadine (Claritin), Cetirizine (Zyrtec), or Fexofenadine (Allegra) for your allergies.  If these don't work you can use Flonase. Just remember to angle it outward when you spray to help coat your sinuses better.  Call me if you have any new breathing problems or questions before your next appointment because I would be happy to see you back sooner if needed.

## 2017-07-28 ENCOUNTER — Encounter (INDEPENDENT_AMBULATORY_CARE_PROVIDER_SITE_OTHER): Payer: Medicare Other | Admitting: Ophthalmology

## 2017-07-28 DIAGNOSIS — H33302 Unspecified retinal break, left eye: Secondary | ICD-10-CM

## 2017-07-28 DIAGNOSIS — H35033 Hypertensive retinopathy, bilateral: Secondary | ICD-10-CM | POA: Diagnosis not present

## 2017-07-28 DIAGNOSIS — H43813 Vitreous degeneration, bilateral: Secondary | ICD-10-CM | POA: Diagnosis not present

## 2017-07-28 DIAGNOSIS — H35372 Puckering of macula, left eye: Secondary | ICD-10-CM

## 2017-07-28 DIAGNOSIS — H2513 Age-related nuclear cataract, bilateral: Secondary | ICD-10-CM

## 2017-07-28 DIAGNOSIS — I10 Essential (primary) hypertension: Secondary | ICD-10-CM

## 2017-07-28 DIAGNOSIS — H353231 Exudative age-related macular degeneration, bilateral, with active choroidal neovascularization: Secondary | ICD-10-CM | POA: Diagnosis not present

## 2017-08-04 ENCOUNTER — Telehealth: Payer: Self-pay | Admitting: Family Medicine

## 2017-08-04 ENCOUNTER — Encounter (INDEPENDENT_AMBULATORY_CARE_PROVIDER_SITE_OTHER): Payer: Medicare Other | Admitting: Ophthalmology

## 2017-08-04 DIAGNOSIS — H33302 Unspecified retinal break, left eye: Secondary | ICD-10-CM | POA: Diagnosis not present

## 2017-08-04 NOTE — Telephone Encounter (Signed)
Needs Rx for lyrica [facility] in[facility]  [facility] [address] [facility]   Pharmacy says it needs to be siged by [employer] enrolled physican
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
## 2017-08-05 DIAGNOSIS — M47816 Spondylosis without myelopathy or radiculopathy, lumbar region: Secondary | ICD-10-CM | POA: Diagnosis not present

## 2017-08-06 NOTE — Telephone Encounter (Signed)
Pt called again about lyrica Rx.

## 2017-08-07 NOTE — Telephone Encounter (Signed)
I called the pharmacy and they said that the prescription will go through but is coming up at around $200 dollars.  She may have to reach her out of pocket minimum before she is able to get the medication at the prior amount.   I am not sure what else I can do to help her at this point for her RLS.  I am happy to seer in the office and we can discuss other options if she cannot afford this medication.  Please call patient to let her know that I looked into this for her.   Thanks, Cherly Anderson. Rosalyn Gess, [place] [facility] Resident PGY-2 08/07/2017 2:08 PM

## 2017-08-07 NOTE — Telephone Encounter (Signed)
Contacted pt and informed her of below. She stated that she would have to weigh her options.Martha Mora, Ambrielle Kington D, [name]

## 2017-08-08 ENCOUNTER — Telehealth: Payer: Self-pay | Admitting: Hematology

## 2017-08-08 ENCOUNTER — Other Ambulatory Visit (HOSPITAL_BASED_OUTPATIENT_CLINIC_OR_DEPARTMENT_OTHER): Payer: Medicare Other

## 2017-08-08 ENCOUNTER — Ambulatory Visit (HOSPITAL_BASED_OUTPATIENT_CLINIC_OR_DEPARTMENT_OTHER): Payer: Medicare Other

## 2017-08-08 ENCOUNTER — Ambulatory Visit (HOSPITAL_BASED_OUTPATIENT_CLINIC_OR_DEPARTMENT_OTHER): Payer: Medicare Other | Admitting: Hematology

## 2017-08-08 VITALS — BP 137/71 | HR 57 | Temp 99.1°F | Resp 18 | Ht 62.0 in | Wt 214.3 lb

## 2017-08-08 DIAGNOSIS — M8580 Other specified disorders of bone density and structure, unspecified site: Secondary | ICD-10-CM

## 2017-08-08 DIAGNOSIS — D72829 Elevated white blood cell count, unspecified: Secondary | ICD-10-CM

## 2017-08-08 DIAGNOSIS — Z170 Estrogen receptor positive status [ER+]: Principal | ICD-10-CM

## 2017-08-08 DIAGNOSIS — C50511 Malignant neoplasm of lower-outer quadrant of right female breast: Secondary | ICD-10-CM

## 2017-08-08 DIAGNOSIS — I10 Essential (primary) hypertension: Secondary | ICD-10-CM | POA: Diagnosis not present

## 2017-08-08 DIAGNOSIS — D720 Genetic anomalies of leukocytes: Secondary | ICD-10-CM

## 2017-08-08 LAB — COMPREHENSIVE METABOLIC PANEL
ALBUMIN: 3.2 g/dL — AB (ref 3.5–5.0)
ALK PHOS: 75 U/L (ref 40–150)
ALT: 20 U/L (ref 0–55)
ANION GAP: 7 meq/L (ref 3–11)
AST: 55 U/L — AB (ref 5–34)
BILIRUBIN TOTAL: 0.43 mg/dL (ref 0.20–1.20)
BUN: 21.7 mg/dL (ref 7.0–26.0)
CALCIUM: 9 mg/dL (ref 8.4–10.4)
CO2: 25 mEq/L (ref 22–29)
CREATININE: 0.8 mg/dL (ref 0.6–1.1)
Chloride: 108 mEq/L (ref 98–109)
EGFR: 73 mL/min/{1.73_m2} — ABNORMAL LOW (ref >90)
Glucose: 95 mg/dl (ref 70–140)
Potassium: 4 mEq/L (ref 3.5–5.1)
Sodium: 141 mEq/L (ref 136–145)
TOTAL PROTEIN: 5.9 g/dL — AB (ref 6.4–8.3)

## 2017-08-08 LAB — CBC WITH DIFFERENTIAL/PLATELET
BASO%: 0.4 % (ref 0.0–2.0)
Basophils Absolute: 0.1 10*3/uL (ref 0.0–0.1)
EOS%: 0.6 % (ref 0.0–7.0)
Eosinophils Absolute: 0.1 10*3/uL (ref 0.0–0.5)
HEMATOCRIT: 39.8 % (ref 34.8–46.6)
HEMOGLOBIN: 12.8 g/dL (ref 11.6–15.9)
LYMPH#: 3.2 10*3/uL (ref 0.9–3.3)
LYMPH%: 18.4 % (ref 14.0–49.7)
MCH: 30.2 pg (ref 25.1–34.0)
MCHC: 32.2 g/dL (ref 31.5–36.0)
MCV: 93.9 fL (ref 79.5–101.0)
MONO#: 1.2 10*3/uL — AB (ref 0.1–0.9)
MONO%: 7 % (ref 0.0–14.0)
NEUT#: 12.8 10*3/uL — ABNORMAL HIGH (ref 1.5–6.5)
NEUT%: 73.6 % (ref 38.4–76.8)
PLATELETS: 275 10*3/uL (ref 145–400)
RBC: 4.24 10*6/uL (ref 3.70–5.45)
RDW: 14.8 % — AB (ref 11.2–14.5)
WBC: 17.4 10*3/uL — ABNORMAL HIGH (ref 3.9–10.3)

## 2017-08-08 MED ORDER — DENOSUMAB 60 MG/ML ~~LOC~~ SOLN
60.0000 mg | Freq: Once | SUBCUTANEOUS | Status: AC
  Administered 2017-08-08: 60 mg via SUBCUTANEOUS
  Filled 2017-08-08: qty 1

## 2017-08-08 NOTE — Patient Instructions (Signed)

## 2017-08-08 NOTE — Telephone Encounter (Signed)
Scheduled appt per 9/21 los - Gave patient AVS and calender per los.  

## 2017-08-09 ENCOUNTER — Encounter: Payer: Self-pay | Admitting: Hematology

## 2017-08-13 DIAGNOSIS — M545 Low back pain: Secondary | ICD-10-CM | POA: Diagnosis not present

## 2017-08-13 DIAGNOSIS — I10 Essential (primary) hypertension: Secondary | ICD-10-CM | POA: Diagnosis not present

## 2017-08-13 DIAGNOSIS — R0602 Shortness of breath: Secondary | ICD-10-CM | POA: Diagnosis not present

## 2017-08-13 DIAGNOSIS — I425 Other restrictive cardiomyopathy: Secondary | ICD-10-CM | POA: Diagnosis not present

## 2017-08-13 DIAGNOSIS — I2510 Atherosclerotic heart disease of native coronary artery without angina pectoris: Secondary | ICD-10-CM | POA: Diagnosis not present

## 2017-08-13 DIAGNOSIS — I340 Nonrheumatic mitral (valve) insufficiency: Secondary | ICD-10-CM | POA: Diagnosis not present

## 2017-08-20 DIAGNOSIS — I10 Essential (primary) hypertension: Secondary | ICD-10-CM | POA: Diagnosis not present

## 2017-08-20 DIAGNOSIS — I2510 Atherosclerotic heart disease of native coronary artery without angina pectoris: Secondary | ICD-10-CM | POA: Diagnosis not present

## 2017-08-20 DIAGNOSIS — R0602 Shortness of breath: Secondary | ICD-10-CM | POA: Diagnosis not present

## 2017-08-25 ENCOUNTER — Encounter (INDEPENDENT_AMBULATORY_CARE_PROVIDER_SITE_OTHER): Payer: Medicare Other | Admitting: Ophthalmology

## 2017-08-29 ENCOUNTER — Encounter (INDEPENDENT_AMBULATORY_CARE_PROVIDER_SITE_OTHER): Payer: Medicare Other | Admitting: Ophthalmology

## 2017-08-29 ENCOUNTER — Telehealth: Payer: Self-pay | Admitting: Family Medicine

## 2017-08-29 NOTE — Telephone Encounter (Signed)
Patient left message on nurse line this AM stating she has had low grade fever (100.4) X 3 days. Also with chest congestion and cough. Has been taking tylenol and mucinex. Hoping to have antibiotic called in. Returned patient's call. Discussed this may be viral and to continue taking tylenol and mucinex as needed, drink plenty of fluids and rest. Since this is Friday afternoon may want to head to Urgent Care or ED if fever spikes, develops discolored drainage, SHOB or chest pain. Patient states understanding and appreciative of call L. Silvano Rusk, RN, BSN '

## 2017-08-29 NOTE — Telephone Encounter (Signed)
Pt called and would like to have something called in for her congestion and cough. Please send in right away. jw

## 2017-08-30 DIAGNOSIS — J029 Acute pharyngitis, unspecified: Secondary | ICD-10-CM | POA: Diagnosis not present

## 2017-08-30 DIAGNOSIS — J441 Chronic obstructive pulmonary disease with (acute) exacerbation: Secondary | ICD-10-CM | POA: Diagnosis not present

## 2017-08-30 DIAGNOSIS — J209 Acute bronchitis, unspecified: Secondary | ICD-10-CM | POA: Diagnosis not present

## 2017-09-05 ENCOUNTER — Telehealth: Payer: Self-pay | Admitting: Family Medicine

## 2017-09-05 NOTE — Telephone Encounter (Signed)
Pt would like something called in for her weak bladder. She said that the doctor has done this before. jw

## 2017-09-08 ENCOUNTER — Encounter (INDEPENDENT_AMBULATORY_CARE_PROVIDER_SITE_OTHER): Payer: Medicare Other | Admitting: Ophthalmology

## 2017-09-08 ENCOUNTER — Other Ambulatory Visit: Payer: Self-pay | Admitting: Family Medicine

## 2017-09-08 DIAGNOSIS — H353231 Exudative age-related macular degeneration, bilateral, with active choroidal neovascularization: Secondary | ICD-10-CM | POA: Diagnosis not present

## 2017-09-08 DIAGNOSIS — I10 Essential (primary) hypertension: Secondary | ICD-10-CM

## 2017-09-08 DIAGNOSIS — M47816 Spondylosis without myelopathy or radiculopathy, lumbar region: Secondary | ICD-10-CM | POA: Diagnosis not present

## 2017-09-08 DIAGNOSIS — H43813 Vitreous degeneration, bilateral: Secondary | ICD-10-CM

## 2017-09-08 DIAGNOSIS — H35033 Hypertensive retinopathy, bilateral: Secondary | ICD-10-CM | POA: Diagnosis not present

## 2017-09-08 DIAGNOSIS — H33302 Unspecified retinal break, left eye: Secondary | ICD-10-CM | POA: Diagnosis not present

## 2017-09-08 MED ORDER — TOLTERODINE TARTRATE 2 MG PO TABS: 2.0000 mg | ORAL_TABLET | Freq: Two times a day (BID) | ORAL | 0 refills | Status: DC

## 2017-09-08 NOTE — Telephone Encounter (Signed)
Called patient and prescribed detrol 2mg  BID.   Sherrica Niehaus L. Rosalyn Gess, [place] Medicine Resident PGY-2 09/08/2017 2:09 PM

## 2017-09-15 ENCOUNTER — Telehealth: Payer: Self-pay

## 2017-09-15 NOTE — Telephone Encounter (Signed)
Patient called and said that her insurance company will not cover the med prescribed for her bladder leakage. Can you please call the pharmacy with something else. I advised her to call to see what her insurance would cover but she said that we could do it.Martha Mora

## 2017-09-18 ENCOUNTER — Other Ambulatory Visit: Payer: Self-pay | Admitting: Family Medicine

## 2017-09-18 NOTE — Telephone Encounter (Signed)
Unfortunately, patient has had adverse reaction to oxybutynin and has previously stated that mybetriq was too expensive and the current medication that I prescribed is too expensive.  I really don't have many other options for her at this point. I would recommend that she call her insurance and find out what is covered.   Please call patient to relay this information.   Gene Glazebrook L. Rosalyn Gess, [place] Medicine Resident PGY-2 09/18/2017 2:23 PM

## 2017-09-18 NOTE — Telephone Encounter (Signed)
Spoke to pt. She will call around and get Rx cost. I told her to go to [facility] and see if there is a discount on that. Pt said she would look. Ottis Stain, CMA

## 2017-10-06 ENCOUNTER — Encounter (INDEPENDENT_AMBULATORY_CARE_PROVIDER_SITE_OTHER): Payer: Medicare Other | Admitting: Ophthalmology

## 2017-10-06 DIAGNOSIS — H353231 Exudative age-related macular degeneration, bilateral, with active choroidal neovascularization: Secondary | ICD-10-CM | POA: Diagnosis not present

## 2017-10-06 DIAGNOSIS — H43813 Vitreous degeneration, bilateral: Secondary | ICD-10-CM | POA: Diagnosis not present

## 2017-10-06 DIAGNOSIS — H33302 Unspecified retinal break, left eye: Secondary | ICD-10-CM | POA: Diagnosis not present

## 2017-10-06 DIAGNOSIS — I10 Essential (primary) hypertension: Secondary | ICD-10-CM | POA: Diagnosis not present

## 2017-10-06 DIAGNOSIS — H2513 Age-related nuclear cataract, bilateral: Secondary | ICD-10-CM | POA: Diagnosis not present

## 2017-10-06 DIAGNOSIS — H35033 Hypertensive retinopathy, bilateral: Secondary | ICD-10-CM | POA: Diagnosis not present

## 2017-10-14 ENCOUNTER — Other Ambulatory Visit: Payer: Self-pay | Admitting: Family Medicine

## 2017-10-16 ENCOUNTER — Other Ambulatory Visit: Payer: Self-pay | Admitting: Family Medicine

## 2017-10-17 DIAGNOSIS — M47816 Spondylosis without myelopathy or radiculopathy, lumbar region: Secondary | ICD-10-CM | POA: Diagnosis not present

## 2017-11-04 ENCOUNTER — Encounter (INDEPENDENT_AMBULATORY_CARE_PROVIDER_SITE_OTHER): Payer: Medicare Other | Admitting: Ophthalmology

## 2017-11-04 DIAGNOSIS — H43813 Vitreous degeneration, bilateral: Secondary | ICD-10-CM

## 2017-11-04 DIAGNOSIS — H35033 Hypertensive retinopathy, bilateral: Secondary | ICD-10-CM

## 2017-11-04 DIAGNOSIS — H2513 Age-related nuclear cataract, bilateral: Secondary | ICD-10-CM

## 2017-11-04 DIAGNOSIS — H353231 Exudative age-related macular degeneration, bilateral, with active choroidal neovascularization: Secondary | ICD-10-CM

## 2017-11-04 DIAGNOSIS — H33302 Unspecified retinal break, left eye: Secondary | ICD-10-CM

## 2017-11-04 DIAGNOSIS — I10 Essential (primary) hypertension: Secondary | ICD-10-CM

## 2017-11-18 HISTORY — PX: CATARACT EXTRACTION: SUR2

## 2017-11-20 ENCOUNTER — Other Ambulatory Visit: Payer: Self-pay | Admitting: Family Medicine

## 2017-11-25 ENCOUNTER — Other Ambulatory Visit: Payer: Self-pay | Admitting: Family Medicine

## 2017-11-25 ENCOUNTER — Telehealth: Payer: Self-pay | Admitting: Family Medicine

## 2017-11-25 NOTE — Telephone Encounter (Signed)
[facility] called requesting a replacement med for Detrol. This medication is not covered by the patients insurance. Pharmacy has faxed request for this change a few times and they are all in the dr's box. Please advise

## 2017-11-28 ENCOUNTER — Telehealth: Payer: Self-pay

## 2017-11-28 ENCOUNTER — Other Ambulatory Visit: Payer: Self-pay

## 2017-11-28 MED ORDER — TOLTERODINE TARTRATE 2 MG PO TABS: 2.0000 mg | ORAL_TABLET | Freq: Two times a day (BID) | ORAL | 0 refills | Status: DC

## 2017-12-01 NOTE — Telephone Encounter (Signed)
error 

## 2017-12-02 ENCOUNTER — Other Ambulatory Visit: Payer: Self-pay | Admitting: Hematology

## 2017-12-03 ENCOUNTER — Telehealth: Payer: Self-pay | Admitting: *Deleted

## 2017-12-03 NOTE — Telephone Encounter (Signed)
Received fax stating that pt insurance does not cover Tolterodine Tartrate 2 mg tab. Routing to PCP to see about sending and alternative medication that will be covered. Katharina Caper, Harvest Deist D, [name]

## 2017-12-04 DIAGNOSIS — H35033 Hypertensive retinopathy, bilateral: Secondary | ICD-10-CM | POA: Diagnosis not present

## 2017-12-04 DIAGNOSIS — H2512 Age-related nuclear cataract, left eye: Secondary | ICD-10-CM | POA: Diagnosis not present

## 2017-12-04 DIAGNOSIS — H25013 Cortical age-related cataract, bilateral: Secondary | ICD-10-CM | POA: Diagnosis not present

## 2017-12-04 DIAGNOSIS — H353231 Exudative age-related macular degeneration, bilateral, with active choroidal neovascularization: Secondary | ICD-10-CM | POA: Diagnosis not present

## 2017-12-04 DIAGNOSIS — H43813 Vitreous degeneration, bilateral: Secondary | ICD-10-CM | POA: Diagnosis not present

## 2017-12-04 DIAGNOSIS — H2513 Age-related nuclear cataract, bilateral: Secondary | ICD-10-CM | POA: Diagnosis not present

## 2017-12-05 ENCOUNTER — Telehealth: Payer: Self-pay | Admitting: *Deleted

## 2017-12-05 ENCOUNTER — Other Ambulatory Visit: Payer: Self-pay | Admitting: Family Medicine

## 2017-12-05 MED ORDER — SOLIFENACIN SUCCINATE 10 MG PO TABS: 10.0000 mg | ORAL_TABLET | Freq: Every day | ORAL | 3 refills | Status: DC

## 2017-12-05 NOTE — Telephone Encounter (Signed)
Pt has been without medication for 2 weeks.  Please call [facility] the replacement [facility] to [facility] [address].

## 2017-12-05 NOTE — Telephone Encounter (Signed)
Received message on nurse line from [facility] Pharmacist requesting change from Detrol (not covered) to vesicare 10 mg. This will be $45 co-pay and patient is willing to pay that. [facility] call [facility] at [phone]. Hubbard Hartshorn, RN, BSN

## 2017-12-05 NOTE — Telephone Encounter (Signed)
Dr. Rosalyn Gess contacted pt and informed her of the refill being sent. Katharina Caper, April D, [name]

## 2017-12-05 NOTE — Telephone Encounter (Signed)
Called pharmacy to let them know vesicare 10mg  is fine.  Thanks, Cherly Anderson. Rosalyn Gess, [place] Medicine Resident PGY-2 12/05/2017 2:54 PM

## 2017-12-09 ENCOUNTER — Encounter (INDEPENDENT_AMBULATORY_CARE_PROVIDER_SITE_OTHER): Payer: Medicare Other | Admitting: Ophthalmology

## 2017-12-10 DIAGNOSIS — I2510 Atherosclerotic heart disease of native coronary artery without angina pectoris: Secondary | ICD-10-CM | POA: Diagnosis not present

## 2017-12-10 DIAGNOSIS — R0602 Shortness of breath: Secondary | ICD-10-CM | POA: Diagnosis not present

## 2017-12-10 DIAGNOSIS — J449 Chronic obstructive pulmonary disease, unspecified: Secondary | ICD-10-CM | POA: Diagnosis not present

## 2017-12-10 DIAGNOSIS — I10 Essential (primary) hypertension: Secondary | ICD-10-CM | POA: Diagnosis not present

## 2017-12-11 DIAGNOSIS — R0602 Shortness of breath: Secondary | ICD-10-CM | POA: Diagnosis not present

## 2017-12-11 DIAGNOSIS — I10 Essential (primary) hypertension: Secondary | ICD-10-CM | POA: Diagnosis not present

## 2017-12-11 DIAGNOSIS — I2510 Atherosclerotic heart disease of native coronary artery without angina pectoris: Secondary | ICD-10-CM | POA: Diagnosis not present

## 2017-12-11 DIAGNOSIS — J449 Chronic obstructive pulmonary disease, unspecified: Secondary | ICD-10-CM | POA: Diagnosis not present

## 2017-12-12 ENCOUNTER — Ambulatory Visit
Admission: RE | Admit: 2017-12-12 | Discharge: 2017-12-12 | Disposition: A | Discharge status: Home / Self Care | Payer: Medicare Other | Source: Ambulatory Visit | Attending: Cardiovascular Disease | Admitting: Cardiovascular Disease

## 2017-12-12 ENCOUNTER — Other Ambulatory Visit: Payer: Self-pay | Admitting: Cardiovascular Disease

## 2017-12-12 DIAGNOSIS — R0602 Shortness of breath: Secondary | ICD-10-CM | POA: Diagnosis not present

## 2017-12-12 DIAGNOSIS — I2510 Atherosclerotic heart disease of native coronary artery without angina pectoris: Secondary | ICD-10-CM | POA: Diagnosis not present

## 2017-12-12 DIAGNOSIS — J449 Chronic obstructive pulmonary disease, unspecified: Secondary | ICD-10-CM | POA: Diagnosis not present

## 2017-12-12 DIAGNOSIS — I10 Essential (primary) hypertension: Secondary | ICD-10-CM | POA: Diagnosis not present

## 2017-12-16 ENCOUNTER — Encounter (INDEPENDENT_AMBULATORY_CARE_PROVIDER_SITE_OTHER): Payer: Medicare Other | Admitting: Ophthalmology

## 2017-12-16 DIAGNOSIS — H2513 Age-related nuclear cataract, bilateral: Secondary | ICD-10-CM

## 2017-12-16 DIAGNOSIS — H353231 Exudative age-related macular degeneration, bilateral, with active choroidal neovascularization: Secondary | ICD-10-CM | POA: Diagnosis not present

## 2017-12-16 DIAGNOSIS — I10 Essential (primary) hypertension: Secondary | ICD-10-CM | POA: Diagnosis not present

## 2017-12-16 DIAGNOSIS — H43813 Vitreous degeneration, bilateral: Secondary | ICD-10-CM

## 2017-12-16 DIAGNOSIS — H35033 Hypertensive retinopathy, bilateral: Secondary | ICD-10-CM

## 2017-12-16 DIAGNOSIS — H33302 Unspecified retinal break, left eye: Secondary | ICD-10-CM

## 2017-12-23 DIAGNOSIS — H25812 Combined forms of age-related cataract, left eye: Secondary | ICD-10-CM | POA: Diagnosis not present

## 2017-12-23 DIAGNOSIS — H2512 Age-related nuclear cataract, left eye: Secondary | ICD-10-CM | POA: Diagnosis not present

## 2017-12-29 ENCOUNTER — Other Ambulatory Visit: Payer: Self-pay | Admitting: *Deleted

## 2017-12-29 MED ORDER — PREGABALIN 75 MG PO CAPS: 75.0000 mg | ORAL_CAPSULE | Freq: Three times a day (TID) | ORAL | 0 refills | Status: DC

## 2018-01-01 ENCOUNTER — Other Ambulatory Visit: Payer: Self-pay

## 2018-01-01 MED ORDER — PREGABALIN 75 MG PO CAPS: 75.0000 mg | ORAL_CAPSULE | Freq: Three times a day (TID) | ORAL | 0 refills | Status: DC

## 2018-01-02 ENCOUNTER — Ambulatory Visit (INDEPENDENT_AMBULATORY_CARE_PROVIDER_SITE_OTHER)
Admission: RE | Admit: 2018-01-02 | Discharge: 2018-01-02 | Disposition: A | Discharge status: Home / Self Care | Payer: Medicare Other | Source: Ambulatory Visit | Attending: Acute Care | Admitting: Acute Care

## 2018-01-02 DIAGNOSIS — Z87891 Personal history of nicotine dependence: Secondary | ICD-10-CM

## 2018-01-05 ENCOUNTER — Other Ambulatory Visit: Payer: Self-pay | Admitting: Family Medicine

## 2018-01-05 MED ORDER — PREGABALIN 75 MG PO CAPS: 75.0000 mg | ORAL_CAPSULE | Freq: Three times a day (TID) | ORAL | 0 refills | Status: DC

## 2018-01-05 NOTE — Telephone Encounter (Signed)
Pt called because the pharmacy is saying that they didn't receive her Lyrica from [place] on 01/01/18. Can we re-send this. jw

## 2018-01-06 ENCOUNTER — Other Ambulatory Visit: Payer: Self-pay | Admitting: Family Medicine

## 2018-01-06 ENCOUNTER — Telehealth: Payer: Self-pay | Admitting: Acute Care

## 2018-01-06 NOTE — Telephone Encounter (Signed)
Pt is calling back about the Lyrica. It does show that the doctor approved the prescription. The pharmacy has never received this, it looks like in epic that he did print and not send. Can were-send this. jw

## 2018-01-06 NOTE — Telephone Encounter (Signed)
  Dr. Rosalyn Gess, Pt. Low dose CT scan for lung cancer screening was read as a Lung RADS 2: nodules that are benign in appearance and behavior with a very low likelihood of becoming a clinically active cancer due to size or lack of growth. Recommendation per radiology is for a repeat LDCT in 12 months. We will schedule annual screening for 12/2018. There was notation of CAD on the scan. I see the patient has been cathed by Dr. [name], and is on statin therapy. There was also notation of hepatic steatosis and other structural changes in the liver suggestive of underlying cirrhosis.Please follow up as you feel is clinically indicated as you know this patient's medical history well. I always want to make sure the patient's  PCP's are aware of any incidental findings.  Please don't hesitate to contact me if you have any questions or concerns. Thanks so much.

## 2018-01-07 NOTE — Telephone Encounter (Signed)
This patient is calling again about her Lyrica not being filled correctly yet. She has called the past 3 days and still nothing had been done about it. She has been without the medication and she needs it asap. Can we get this filled for her asap

## 2018-01-09 ENCOUNTER — Other Ambulatory Visit: Payer: Self-pay | Admitting: Acute Care

## 2018-01-09 DIAGNOSIS — Z87891 Personal history of nicotine dependence: Secondary | ICD-10-CM

## 2018-01-09 DIAGNOSIS — Z122 Encounter for screening for malignant neoplasm of respiratory organs: Secondary | ICD-10-CM

## 2018-01-09 NOTE — Telephone Encounter (Signed)
I called this verbally to [facility] at [place] just now. Danley Danker, RN [facility] RN)

## 2018-01-09 NOTE — Telephone Encounter (Signed)
Pt is calling again about her Lyrica. Her pharmacy hasnt received the Rx yet.  Please advise

## 2018-01-12 DIAGNOSIS — H2511 Age-related nuclear cataract, right eye: Secondary | ICD-10-CM | POA: Diagnosis not present

## 2018-01-12 DIAGNOSIS — H25011 Cortical age-related cataract, right eye: Secondary | ICD-10-CM | POA: Diagnosis not present

## 2018-01-16 ENCOUNTER — Encounter (INDEPENDENT_AMBULATORY_CARE_PROVIDER_SITE_OTHER): Payer: Medicare Other | Admitting: Ophthalmology

## 2018-01-19 ENCOUNTER — Encounter (INDEPENDENT_AMBULATORY_CARE_PROVIDER_SITE_OTHER): Payer: Medicare Other | Admitting: Ophthalmology

## 2018-01-19 DIAGNOSIS — H35033 Hypertensive retinopathy, bilateral: Secondary | ICD-10-CM | POA: Diagnosis not present

## 2018-01-19 DIAGNOSIS — H353231 Exudative age-related macular degeneration, bilateral, with active choroidal neovascularization: Secondary | ICD-10-CM | POA: Diagnosis not present

## 2018-01-19 DIAGNOSIS — H2511 Age-related nuclear cataract, right eye: Secondary | ICD-10-CM | POA: Diagnosis not present

## 2018-01-19 DIAGNOSIS — H33302 Unspecified retinal break, left eye: Secondary | ICD-10-CM

## 2018-01-19 DIAGNOSIS — H43813 Vitreous degeneration, bilateral: Secondary | ICD-10-CM

## 2018-01-19 DIAGNOSIS — I10 Essential (primary) hypertension: Secondary | ICD-10-CM | POA: Diagnosis not present

## 2018-01-20 DIAGNOSIS — H2511 Age-related nuclear cataract, right eye: Secondary | ICD-10-CM | POA: Diagnosis not present

## 2018-01-20 DIAGNOSIS — H25811 Combined forms of age-related cataract, right eye: Secondary | ICD-10-CM | POA: Diagnosis not present

## 2018-02-02 ENCOUNTER — Other Ambulatory Visit: Payer: Self-pay | Admitting: Hematology

## 2018-02-02 ENCOUNTER — Other Ambulatory Visit: Payer: Self-pay | Admitting: *Deleted

## 2018-02-02 MED ORDER — TRAZODONE HCL 50 MG PO TABS: 50.0000 mg | ORAL_TABLET | Freq: Every evening | ORAL | 0 refills | Status: DC | PRN

## 2018-02-05 ENCOUNTER — Other Ambulatory Visit: Payer: Medicare Other

## 2018-02-05 ENCOUNTER — Ambulatory Visit: Payer: Medicare Other

## 2018-02-06 ENCOUNTER — Inpatient Hospital Stay: Payer: Medicare Other

## 2018-02-06 ENCOUNTER — Inpatient Hospital Stay: Payer: Medicare Other | Attending: Hematology

## 2018-02-06 DIAGNOSIS — C50511 Malignant neoplasm of lower-outer quadrant of right female breast: Secondary | ICD-10-CM

## 2018-02-06 DIAGNOSIS — Z170 Estrogen receptor positive status [ER+]: Secondary | ICD-10-CM

## 2018-02-06 DIAGNOSIS — M8580 Other specified disorders of bone density and structure, unspecified site: Secondary | ICD-10-CM | POA: Insufficient documentation

## 2018-02-06 LAB — CBC WITH DIFFERENTIAL/PLATELET
Basophils Absolute: 0.1 10*3/uL (ref 0.0–0.1)
Basophils Relative: 1 %
EOS PCT: 4 %
Eosinophils Absolute: 0.5 10*3/uL (ref 0.0–0.5)
HEMATOCRIT: 36.4 % (ref 34.8–46.6)
Hemoglobin: 11.8 g/dL (ref 11.6–15.9)
Lymphocytes Relative: 17 %
Lymphs Abs: 2 10*3/uL (ref 0.9–3.3)
MCH: 28.7 pg (ref 25.1–34.0)
MCHC: 32.4 g/dL (ref 31.5–36.0)
MCV: 88.5 fL (ref 79.5–101.0)
MONO ABS: 0.7 10*3/uL (ref 0.1–0.9)
MONOS PCT: 6 %
NEUTROS ABS: 8.5 10*3/uL — AB (ref 1.5–6.5)
Neutrophils Relative %: 72 %
Platelets: 239 10*3/uL (ref 145–400)
RBC: 4.12 MIL/uL (ref 3.70–5.45)
RDW: 14.4 % (ref 11.2–14.5)
WBC: 11.9 10*3/uL — ABNORMAL HIGH (ref 3.9–10.3)

## 2018-02-06 LAB — COMPREHENSIVE METABOLIC PANEL
ALT: 21 U/L (ref 0–55)
AST: 33 U/L (ref 5–34)
Albumin: 3.1 g/dL — ABNORMAL LOW (ref 3.5–5.0)
Alkaline Phosphatase: 80 U/L (ref 40–150)
Anion gap: 9 (ref 3–11)
BILIRUBIN TOTAL: 0.4 mg/dL (ref 0.2–1.2)
BUN: 11 mg/dL (ref 7–26)
CO2: 24 mmol/L (ref 22–29)
Calcium: 9.1 mg/dL (ref 8.4–10.4)
Chloride: 110 mmol/L — ABNORMAL HIGH (ref 98–109)
Creatinine, Ser: 0.86 mg/dL (ref 0.60–1.10)
Glucose, Bld: 104 mg/dL (ref 70–140)
POTASSIUM: 4 mmol/L (ref 3.5–5.1)
Sodium: 143 mmol/L (ref 136–145)
Total Protein: 5.9 g/dL — ABNORMAL LOW (ref 6.4–8.3)

## 2018-02-06 MED ORDER — DENOSUMAB 60 MG/ML ~~LOC~~ SOLN
60.0000 mg | Freq: Once | SUBCUTANEOUS | Status: AC
  Administered 2018-02-06: 60 mg via SUBCUTANEOUS

## 2018-02-06 NOTE — Patient Instructions (Signed)
Denosumab injection What is this medicine? DENOSUMAB (den oh sue mab) slows bone breakdown. Prolia is used to treat osteoporosis in women after menopause and in men. Xgeva is used to prevent bone fractures and other bone problems caused by cancer bone metastases. Xgeva is also used to treat giant cell tumor of the bone. This medicine may be used for other purposes; ask your health care provider or pharmacist if you have questions. What should I tell my health care provider before I take this medicine? They need to know if you have any of these conditions: -dental disease -eczema -infection or history of infections -kidney disease or on dialysis -low blood calcium or vitamin D -malabsorption syndrome -scheduled to have surgery or tooth extraction -taking medicine that contains denosumab -thyroid or parathyroid disease -an unusual reaction to denosumab, other medicines, foods, dyes, or preservatives -pregnant or trying to get pregnant -breast-feeding How should I use this medicine? This medicine is for injection under the skin. It is given by a health care professional in a hospital or clinic setting. If you are getting Prolia, a special MedGuide will be given to you by the pharmacist with each prescription and refill. Be sure to read this information carefully each time. For Prolia, talk to your pediatrician regarding the use of this medicine in children. Special care may be needed. For Xgeva, talk to your pediatrician regarding the use of this medicine in children. While this drug may be prescribed for children as young as 13 years for selected conditions, precautions do apply. Overdosage: If you think you have taken too much of this medicine contact a poison control center or emergency room at once. NOTE: This medicine is only for you. Do not share this medicine with others. What if I miss a dose? It is important not to miss your dose. Call your doctor or health care professional if you are  unable to keep an appointment. What may interact with this medicine? Do not take this medicine with any of the following medications: -other medicines containing denosumab This medicine may also interact with the following medications: -medicines that suppress the immune system -medicines that treat cancer -steroid medicines like prednisone or cortisone This list may not describe all possible interactions. Give your health care provider a list of all the medicines, herbs, non-prescription drugs, or dietary supplements you use. Also tell them if you smoke, drink alcohol, or use illegal drugs. Some items may interact with your medicine. What should I watch for while using this medicine? Visit your doctor or health care professional for regular checks on your progress. Your doctor or health care professional may order blood tests and other tests to see how you are doing. Call your doctor or health care professional if you get a cold or other infection while receiving this medicine. Do not treat yourself. This medicine may decrease your body's ability to fight infection. You should make sure you get enough calcium and vitamin D while you are taking this medicine, unless your doctor tells you not to. Discuss the foods you eat and the vitamins you take with your health care professional. See your dentist regularly. Brush and floss your teeth as directed. Before you have any dental work done, tell your dentist you are receiving this medicine. Do not become pregnant while taking this medicine or for 5 months after stopping it. Women should inform their doctor if they wish to become pregnant or think they might be pregnant. There is a potential for serious side effects   to an unborn child. Talk to your health care professional or pharmacist for more information. What side effects may I notice from receiving this medicine? Side effects that you should report to your doctor or health care professional as soon as  possible: -allergic reactions like skin rash, itching or hives, swelling of the face, lips, or tongue -breathing problems -chest pain -fast, irregular heartbeat -feeling faint or lightheaded, falls -fever, chills, or any other sign of infection -muscle spasms, tightening, or twitches -numbness or tingling -skin blisters or bumps, or is dry, peels, or red -slow healing or unexplained pain in the mouth or jaw -unusual bleeding or bruising Side effects that usually do not require medical attention (Report these to your doctor or health care professional if they continue or are bothersome.): -muscle pain -stomach upset, gas This list may not describe all possible side effects. Call your doctor for medical advice about side effects. You may report side effects to FDA at 1-800-FDA-1088. Where should I keep my medicine? This medicine is only given in a clinic, doctor's office, or other health care setting and will not be stored at home. NOTE: This sheet is a summary. It may not cover all possible information. If you have questions about this medicine, talk to your doctor, pharmacist, or health care provider.    2016, Elsevier/Gold Standard. (2012-05-04 12:37:47) Influenza Virus Vaccine (Flucelvax) What is this medicine? INFLUENZA VIRUS VACCINE (in floo EN zuh VAHY ruhs vak SEEN) helps to reduce the risk of getting influenza also known as the flu. The vaccine only helps protect you against some strains of the flu. This medicine may be used for other purposes; ask your health care provider or pharmacist if you have questions. What should I tell my health care provider before I take this medicine? They need to know if you have any of these conditions: -bleeding disorder like hemophilia -fever or infection -Guillain-Barre syndrome or other neurological problems -immune system problems -infection with the human immunodeficiency virus (HIV) or AIDS -low blood platelet counts -multiple  sclerosis -an unusual or allergic reaction to influenza virus vaccine, other medicines, foods, dyes or preservatives -pregnant or trying to get pregnant -breast-feeding How should I use this medicine? This vaccine is for injection into a muscle. It is given by a health care professional. A copy of Vaccine Information Statements will be given before each vaccination. Read this sheet carefully each time. The sheet may change frequently. Talk to your pediatrician regarding the use of this medicine in children. Special care may be needed. Overdosage: If you think you've taken too much of this medicine contact a poison control center or emergency room at once. Overdosage: If you think you have taken too much of this medicine contact a poison control center or emergency room at once. NOTE: This medicine is only for you. Do not share this medicine with others. What if I miss a dose? This does not apply. What may interact with this medicine? -chemotherapy or radiation therapy -medicines that lower your immune system like etanercept, anakinra, infliximab, and adalimumab -medicines that treat or prevent blood clots like warfarin -phenytoin -steroid medicines like prednisone or cortisone -theophylline -vaccines This list may not describe all possible interactions. Give your health care provider a list of all the medicines, herbs, non-prescription drugs, or dietary supplements you use. Also tell them if you smoke, drink alcohol, or use illegal drugs. Some items may interact with your medicine. What should I watch for while using this medicine? Report any side effects   that do not go away within 3 days to your doctor or health care professional. Call your health care provider if any unusual symptoms occur within 6 weeks of receiving this vaccine. You may still catch the flu, but the illness is not usually as bad. You cannot get the flu from the vaccine. The vaccine will not protect against colds or other  illnesses that may cause fever. The vaccine is needed every year. What side effects may I notice from receiving this medicine? Side effects that you should report to your doctor or health care professional as soon as possible: -allergic reactions like skin rash, itching or hives, swelling of the face, lips, or tongue Side effects that usually do not require medical attention (Report these to your doctor or health care professional if they continue or are bothersome.): -fever -headache -muscle aches and pains -pain, tenderness, redness, or swelling at the injection site -tiredness This list may not describe all possible side effects. Call your doctor for medical advice about side effects. You may report side effects to FDA at 1-800-FDA-1088. Where should I keep my medicine? The vaccine will be given by a health care professional in a clinic, pharmacy, doctor's office, or other health care setting. You will not be given vaccine doses to store at home. NOTE: This sheet is a summary. It may not cover all possible information. If you have questions about this medicine, talk to your doctor, pharmacist, or health care provider.    2016, Elsevier/Gold Standard. (2011-10-16 14:06:47)  

## 2018-02-09 ENCOUNTER — Other Ambulatory Visit: Payer: Self-pay | Admitting: Family Medicine

## 2018-02-10 ENCOUNTER — Telehealth: Payer: Self-pay | Admitting: *Deleted

## 2018-02-10 NOTE — Telephone Encounter (Signed)
TCT patient regarding lab results from last week (02/06/18). Spoke with patient and informed her that her labs were fine and that Dr. Burr Medico has no concerns at this time.  Brenetta has voiced understanding. She has no questions or concerns at this time.

## 2018-02-11 ENCOUNTER — Other Ambulatory Visit: Payer: Self-pay | Admitting: Family Medicine

## 2018-02-11 ENCOUNTER — Other Ambulatory Visit: Payer: Self-pay | Admitting: *Deleted

## 2018-02-11 ENCOUNTER — Telehealth: Payer: Self-pay | Admitting: Family Medicine

## 2018-02-11 MED ORDER — ALBUTEROL SULFATE HFA 108 (90 BASE) MCG/ACT IN AERS: INHALATION_SPRAY | RESPIRATORY_TRACT | 2 refills | Status: DC

## 2018-02-11 MED ORDER — FLUTICASONE PROPIONATE 50 MCG/ACT NA SUSP: 2.0000 | Freq: Every day | NASAL | 6 refills | Status: DC

## 2018-02-11 NOTE — Telephone Encounter (Signed)
Pt went to the pharmacy to pick up a Rx they had ready for her. The medication she picked was Flonase, but that's not what she was wanting. Pt said she requested to get a refill on her albuterol. Please call patient to discuss this and get it straightened out.

## 2018-02-11 NOTE — Telephone Encounter (Signed)
Spoke with patient and updated medication list. Refill sent. Informed patient to make appointment.

## 2018-02-11 NOTE — Telephone Encounter (Signed)
Will forward to MD as there is not a current albuterol medication on her list. Johnney Ou

## 2018-02-12 ENCOUNTER — Ambulatory Visit (INDEPENDENT_AMBULATORY_CARE_PROVIDER_SITE_OTHER): Payer: Medicare Other | Admitting: Family Medicine

## 2018-02-12 ENCOUNTER — Encounter: Payer: Self-pay | Admitting: Family Medicine

## 2018-02-12 VITALS — BP 140/80 | HR 87 | Temp 98.7°F | Wt 218.4 lb

## 2018-02-12 DIAGNOSIS — J441 Chronic obstructive pulmonary disease with (acute) exacerbation: Secondary | ICD-10-CM

## 2018-02-12 MED ORDER — SULFAMETHOXAZOLE-TRIMETHOPRIM 800-160 MG PO TABS: 1.0000 | ORAL_TABLET | Freq: Two times a day (BID) | ORAL | 0 refills | Status: AC

## 2018-02-12 MED ORDER — GUAIFENESIN ER 600 MG PO TB12: 600.0000 mg | ORAL_TABLET | Freq: Two times a day (BID) | ORAL | 0 refills | Status: DC

## 2018-02-12 MED ORDER — PREDNISONE 50 MG PO TABS: 50.0000 mg | ORAL_TABLET | Freq: Every day | ORAL | 0 refills | Status: AC

## 2018-02-12 MED ORDER — ALBUTEROL SULFATE HFA 108 (90 BASE) MCG/ACT IN AERS: INHALATION_SPRAY | RESPIRATORY_TRACT | 2 refills | Status: DC

## 2018-02-12 NOTE — Patient Instructions (Addendum)
Thank you for coming in today, it was so nice to see you! Today we talked about:    I think you are likely having a COPD exacerbation: We are going to put you on a 5 day prednisone and antibiotic course.  Use your albuterol inhaler once every 4 hours for the next 24 hours and then as needed  Reasons to go to the ED: Is your shortness of breath does not resolve with albuterol, you cannot speak more than 1 word at a time without becoming short of breath, you develop a fever  Please follow up in 1-2 weeks for shortness of breath. You can schedule this appointment at the front desk before you leave or call the clinic.   If you have any questions or concerns, please do not hesitate to call the office at [phone]. You can also message me directly via MyChart.   Sincerely,  Smitty Cords, MD

## 2018-02-12 NOTE — Progress Notes (Signed)
Subjective:    Patient ID: Martha Mora , female   DOB: 25-Jun-1946 , 72 y.o..   MRN: [id number]  HPI  [name] is a 72 year old female with past medical history of hypertension, CAD, mitral valve regurgitation, OSA, GERD, IBS, breast cancer, anxiety here for  Chief Complaint  Patient presents with  . Shortness of Breath    1. Shortness of breath: Patient notes that she has felt short of breath for about 6-8 months.  She notes that she got a cold a week ago and it made her symptoms worse.  Her granddaughter gave her this cold, her symptoms are sore throat and nasal congestion.  Has not had any fevers.  She did have pneumonia 6 months ago but states that all of her symptoms from that have resolved.  She has been using albuterol every 4 hours but notes that it wears off after 30 minutes.  She has not noticed any increased edema, orthopnea and she is compliant with her CPAP machine.  Review of Systems: Per HPI.    Past Medical History: Patient Active Problem List   Diagnosis Date Noted  . Insomnia 09/08/2016  . Chest pain on exertion 05/23/2016  . Mixed incontinence 10/25/2015  . Other fatigue 10/25/2015  . Restless leg syndrome 05/24/2015  . Stress incontinence 02/02/2015  . Preventative health care 05/23/2014  . Mitral valve regurgitation 05/23/2014  . Dysfunctional uterine bleeding 09/02/2013  . Breast cancer of lower-outer quadrant of right female breast ([place]) 07/24/2013  . Postmenopausal bleeding 07/14/2013  . Osteopenia 12/27/2011  . ANXIETY 07/19/2009  . Osteoarthritis 07/19/2009  . NAUSEA 07/19/2009  . IRRITABLE BOWEL SYNDROME 11/17/2008  . PERSONAL HX COLONIC POLYPS 07/19/2008  . COLONIC POLYPS, ADENOMATOUS 11/17/2007  . CARCINOMA IN SITU OF BREAST 11/17/2007  . HYPERLIPIDEMIA 11/17/2007  . Obesity, unspecified 11/17/2007  . Depression 11/17/2007  . Essential hypertension 11/17/2007  . Coronary atherosclerosis 11/17/2007  . HEMORRHOIDS, INTERNAL 11/17/2007  .  GERD 11/17/2007  . PEPTIC STRICTURE 11/17/2007  . SLEEP APNEA 11/17/2007  . Diverticulosis of colon (without mention of hemorrhage) 09/05/2005  . REFLUX ESOPHAGITIS 06/01/2002  . ESOPHAGEAL STRICTURE 06/01/2002    Medications: reviewed and updated Current Outpatient Medications  Medication Sig Dispense Refill  . albuterol (VENTOLIN HFA) 108 (90 Base) MCG/ACT inhaler 1-2 inhalations every 4-6 hours as needed for wheezing. Dispense spacer as needed. 6.7 g 2  . amLODipine (NORVASC) 5 MG tablet Take 5 mg by mouth daily.  2  . aspirin EC 81 MG EC tablet Take 1 tablet (81 mg total) by mouth daily. 30 tablet 3  . azithromycin (ZITHROMAX) 250 MG tablet TAKE 2 TABLETS BY MOUTH TODAY, THEN TAKE 1 TABLET DAILY FOR 4 DAYS  0  . brimonidine (ALPHAGAN) 0.2 % ophthalmic solution 1 DROP INTO LEFT EYE TWICE A DAY START 48 HOURS PRIOR TO SURGERY  1  . Calcium Carbonate-Vitamin D 600-400 MG-UNIT per tablet Take 1 tablet by mouth 2 (two) times daily.     . carvedilol (COREG) 3.125 MG tablet Take 3.125 mg by mouth 2 (two) times daily.  0  . citalopram (CELEXA) 40 MG tablet Take 0.5 tablets (20 mg total) by mouth 2 (two) times daily. 90 tablet 3  . clindamycin (CLEOCIN) 300 MG capsule TAKE 1 CAPSULE BY MOUTH THREE TIMES A DAY UNTIL FINISHED  0  . COMBIVENT RESPIMAT 20-100 MCG/ACT AERS respimat     . cyclopentolate (CYCLODRYL,CYCLOGYL) 1 % ophthalmic solution 1 DROP IN LEFT EYE THREE TIMES  A DAY START 24 HOURS PRIOR TO SURGERY  0  . diclofenac (VOLTAREN) 50 MG EC tablet     . guaiFENesin (MUCINEX) 600 MG 12 hr tablet Take 1 tablet (600 mg total) by mouth 2 (two) times daily. 7 tablet 0  . ibuprofen (ADVIL,MOTRIN) 800 MG tablet TAKE 1 TABLET BY MOUTH FOUR TIMES A DAY AS NEEDED FOR PAIN  0  . ketorolac (ACULAR) 0.5 % ophthalmic solution 1 DROP IN LEFT EYE FOUR TIMES A DAY START 72 HOURS PRIOR TO SURGERY  1  . losartan (COZAAR) 100 MG tablet Take 100 mg by mouth every evening.     [name] LYRICA 75 MG capsule TAKE 1  CAPSULE 3 TIMES A DAY 90 capsule 0  . Multiple Vitamin (MULTIVITAMIN) tablet Take 1 tablet by mouth daily. Herbal life multivitamin    . nitroGLYCERIN (NITROSTAT) 0.4 MG SL tablet Place 1 tablet (0.4 mg total) under the tongue every 5 (five) minutes x 3 doses as needed for chest pain. 25 tablet 12  . ofloxacin (OCUFLOX) 0.3 % ophthalmic solution 1 DROP IN BOTH EYES FOUR TIMES A DAY START 72 HOURS PRIOR TO SURGERY  1  . Omega-3 Fatty Acids (FISH OIL) 1000 MG CAPS Take 1 capsule by mouth daily.     [name] omeprazole (PRILOSEC) 40 MG capsule TAKE 1 CAPSULE (40 MG TOTAL) BY MOUTH DAILY. 30 capsule 6  . pravastatin (PRAVACHOL) 40 MG tablet Take 40 mg by mouth daily.      . prednisoLONE acetate (PRED FORTE) 1 % ophthalmic suspension 1 DROP INTO RIGHT EYE FOUR TIMES A DAY START AFTER SURGERY  1  . predniSONE (DELTASONE) 50 MG tablet Take 1 tablet (50 mg total) by mouth daily with breakfast for 5 days. 5 tablet 0  . solifenacin (VESICARE) 10 MG tablet Take 1 tablet (10 mg total) by mouth daily. 30 tablet 3  . sulfamethoxazole-trimethoprim (BACTRIM DS,SEPTRA DS) 800-160 MG tablet Take 1 tablet by mouth 2 (two) times daily for 5 days. 10 tablet 0  . tamoxifen (NOLVADEX) 20 MG tablet TAKE 1 TABLET BY MOUTH  DAILY 90 tablet 2  . temazepam (RESTORIL) 15 MG capsule Take by mouth.    . ticagrelor (BRILINTA) 90 MG TABS tablet Take by mouth.    . tolterodine (DETROL) 2 MG tablet TAKE 1 TABLET BY MOUTH TWICE A DAY 60 tablet 0  . tolterodine (DETROL) 2 MG tablet TAKE 1 TABLET BY MOUTH TWICE A DAY 60 tablet 0  . tolterodine (DETROL) 2 MG tablet Take 1 tablet (2 mg total) by mouth 2 (two) times daily. 60 tablet 0  . traMADol (ULTRAM) 50 MG tablet Take 1 tablet (50 mg total) by mouth every 6 (six) hours as needed. 6 tablet 0  . traZODone (DESYREL) 50 MG tablet Take 1 tablet (50 mg total) by mouth at bedtime as needed for sleep. 30 tablet 0  . vitamin E 200 UNIT capsule Take 200 Units by mouth daily.    [name] zolendronic acid  (ZOMETA) 4 MG/5ML injection Inject 4 mg into the vein every 6 (six) months.     No current facility-administered medications for this visit.     Social Hx:  reports that she quit smoking about 14 months ago. Her smoking use included cigarettes. She started smoking about 57 years ago. She has a 81.00 pack-year smoking history. She has never used smokeless tobacco.   Objective:   BP 140/80 (BP Location: Left Arm, Patient Position: Sitting, Cuff Size: Normal)   Pulse  87   Temp 98.7 F (37.1 C) (Oral)   Wt 218 lb 6.4 oz (99.1 kg)   SpO2 96%   BMI 39.95 kg/m  Physical Exam  Gen: NAD, alert, cooperative with exam, well-appearing HEENT: NCAT, PERRL, clear conjunctiva, oropharynx clear, supple neck Cardiac: Regular rate and rhythm, normal S1/S2, capillary refill brisk, trace edema bilateral ankles Respiratory: Faint occasional expiratory wheeze heard bilaterally, non-labored breathing Psych: good insight, normal mood and affect  Assessment & Plan:   .1. COPD exacerbation ([place]): History and exam most consistent with COPD exacerbation.  She has no hypoxia or labored breathing on exam and afebrile.  Has worsening shortness of breath, cough with productive sputum.  No signs of CHF  -Prednisone 50 mg daily for the next 5 days -Bactrim for 5-day course (did not to azithromycin as she is also on citalopram; would like to avoid QT prolongation) - Albuterol every 4 hours for the next 24 hours and then as needed -Mucinex -Continue to wear CPAP machine every night -ED precautions discussed - Follow-up in 1-2 weeks  Meds ordered this encounter  Medications  . predniSONE (DELTASONE) 50 MG tablet    Sig: Take 1 tablet (50 mg total) by mouth daily with breakfast for 5 days.    Dispense:  5 tablet    Refill:  0  . sulfamethoxazole-trimethoprim (BACTRIM DS,SEPTRA DS) 800-160 MG tablet    Sig: Take 1 tablet by mouth 2 (two) times daily for 5 days.    Dispense:  10 tablet    Refill:  0  .  albuterol (VENTOLIN HFA) 108 (90 Base) MCG/ACT inhaler    Sig: 1-2 inhalations every 4-6 hours as needed for wheezing. Dispense spacer as needed.    Dispense:  6.7 g    Refill:  2  . guaiFENesin (MUCINEX) 600 MG 12 hr tablet    Sig: Take 1 tablet (600 mg total) by mouth 2 (two) times daily.    Dispense:  7 tablet    Refill:  0    Smitty Cords, MD [place], PGY-3

## 2018-02-23 ENCOUNTER — Encounter (INDEPENDENT_AMBULATORY_CARE_PROVIDER_SITE_OTHER): Payer: Medicare Other | Admitting: Ophthalmology

## 2018-02-25 ENCOUNTER — Ambulatory Visit: Payer: Medicare Other | Admitting: Family Medicine

## 2018-02-26 ENCOUNTER — Encounter (INDEPENDENT_AMBULATORY_CARE_PROVIDER_SITE_OTHER): Payer: Medicare Other | Admitting: Ophthalmology

## 2018-02-27 ENCOUNTER — Telehealth: Payer: Self-pay

## 2018-02-27 NOTE — Telephone Encounter (Signed)
Pt called nurse line stating she received a letter saying [name] was not authorized to rx her lyrica and to for [place] [facility]. [facility] and [facility] informed my that on their end [name] is authorized to rx and that pt picked up Rx written by her on 02/10/18. Called patient and she states her insurance company is who is saying they will not cover rx if written by unauthorized MD. I told pt she also needs an OV before she could have any refills on Lyrica per last refill. Pt knows to schedule OV for follow up and advised to bring letter from insurance with her. Wallace Cullens, RN

## 2018-03-03 ENCOUNTER — Encounter (INDEPENDENT_AMBULATORY_CARE_PROVIDER_SITE_OTHER): Payer: Medicare Other | Admitting: Ophthalmology

## 2018-03-03 DIAGNOSIS — H35033 Hypertensive retinopathy, bilateral: Secondary | ICD-10-CM | POA: Diagnosis not present

## 2018-03-03 DIAGNOSIS — H33302 Unspecified retinal break, left eye: Secondary | ICD-10-CM | POA: Diagnosis not present

## 2018-03-03 DIAGNOSIS — H353231 Exudative age-related macular degeneration, bilateral, with active choroidal neovascularization: Secondary | ICD-10-CM | POA: Diagnosis not present

## 2018-03-03 DIAGNOSIS — H43813 Vitreous degeneration, bilateral: Secondary | ICD-10-CM | POA: Diagnosis not present

## 2018-03-03 DIAGNOSIS — I10 Essential (primary) hypertension: Secondary | ICD-10-CM

## 2018-03-13 ENCOUNTER — Other Ambulatory Visit: Payer: Self-pay | Admitting: *Deleted

## 2018-03-16 NOTE — Telephone Encounter (Signed)
2nd request for lyrica.  Demetrio Leighty,CMA

## 2018-03-17 MED ORDER — PREGABALIN 75 MG PO CAPS: 75.0000 mg | ORAL_CAPSULE | Freq: Every day | ORAL | 0 refills | Status: DC

## 2018-03-18 DIAGNOSIS — I10 Essential (primary) hypertension: Secondary | ICD-10-CM | POA: Diagnosis not present

## 2018-03-18 DIAGNOSIS — J449 Chronic obstructive pulmonary disease, unspecified: Secondary | ICD-10-CM | POA: Diagnosis not present

## 2018-03-18 DIAGNOSIS — R0602 Shortness of breath: Secondary | ICD-10-CM | POA: Diagnosis not present

## 2018-03-18 DIAGNOSIS — I2510 Atherosclerotic heart disease of native coronary artery without angina pectoris: Secondary | ICD-10-CM | POA: Diagnosis not present

## 2018-04-02 ENCOUNTER — Other Ambulatory Visit: Payer: Self-pay | Admitting: Family Medicine

## 2018-04-14 ENCOUNTER — Other Ambulatory Visit: Payer: Self-pay

## 2018-04-14 ENCOUNTER — Encounter: Payer: Self-pay | Admitting: Family Medicine

## 2018-04-14 ENCOUNTER — Ambulatory Visit: Payer: Medicare Other | Admitting: Family Medicine

## 2018-04-14 VITALS — BP 138/70 | HR 81 | Temp 98.9°F | Ht 62.0 in | Wt 218.0 lb

## 2018-04-14 DIAGNOSIS — N393 Stress incontinence (female) (male): Secondary | ICD-10-CM | POA: Diagnosis not present

## 2018-04-14 DIAGNOSIS — N3946 Mixed incontinence: Secondary | ICD-10-CM | POA: Diagnosis not present

## 2018-04-14 DIAGNOSIS — R05 Cough: Secondary | ICD-10-CM

## 2018-04-14 DIAGNOSIS — M791 Myalgia, unspecified site: Secondary | ICD-10-CM | POA: Diagnosis not present

## 2018-04-14 DIAGNOSIS — M47812 Spondylosis without myelopathy or radiculopathy, cervical region: Secondary | ICD-10-CM | POA: Diagnosis not present

## 2018-04-14 DIAGNOSIS — R059 Cough, unspecified: Secondary | ICD-10-CM | POA: Insufficient documentation

## 2018-04-14 LAB — POCT URINALYSIS DIP (MANUAL ENTRY)
Bilirubin, UA: NEGATIVE
GLUCOSE UA: NEGATIVE mg/dL
Ketones, POC UA: NEGATIVE mg/dL
NITRITE UA: NEGATIVE
SPEC GRAV UA: 1.02 (ref 1.010–1.025)
Urobilinogen, UA: 0.2 E.U./dL
pH, UA: 6.5 (ref 5.0–8.0)

## 2018-04-14 LAB — POCT UA - MICROSCOPIC ONLY

## 2018-04-14 MED ORDER — SOLIFENACIN SUCCINATE 10 MG PO TABS: 10.0000 mg | ORAL_TABLET | Freq: Every day | ORAL | 3 refills | Status: DC

## 2018-04-14 MED ORDER — LORATADINE 10 MG PO TABS: 10.0000 mg | ORAL_TABLET | Freq: Every day | ORAL | Status: DC

## 2018-04-14 NOTE — Assessment & Plan Note (Addendum)
Likely due to residual lung irritation from COPD exacerbation with allergies contributing. Afebrile with clear lung exam with slight prolonged expiratory phase. Recommended continued use of PRN albuterol and mucinex. Prescribed claritin. Last seen by Pulmonology in 2018 with PFTs at that time and attributed shortness of breath to physical deconditioning. No need for controller inhaler at this time. Return precautions given.

## 2018-04-14 NOTE — Patient Instructions (Signed)
It was great to see you!  For your cough,  - I am prescribing an allergy medication to help with your allergies. Take this once daily. - Continue to use albuterol as needed but if you find you are using it more often or develop difficulty breathing more than usual, you should be seen by a doctor.  For your incontinence - I am prescribing vesicare to take daily - I am referring you to Physical Therapy to help with pelvic floor exercises - I am also getting a urine specimen to make sure you don't have any underlying infection. We will call you with those results.  Take care and seek immediate care sooner if you develop any concerns.   Dr. [facility]

## 2018-04-14 NOTE — Progress Notes (Signed)
Subjective:   Patient ID: Martha Mora    DOB: June 08, 1946, 72 y.o. female   MRN: [id number]  Martha Mora is a 72 y.o. female with a history of HTN, MVR, sleep apnea, GERD, IBS, OA, h/o breast cancer, insomnia, dysfunctional uterine bleeding, RLS here for   Bladder incontinence - previously with oxybutynin but had reaction (tachycardia, flushing per chart review) - was also on mybetriq but was too expensive. - states she is having accidents every night in sleep. Has h/o incontinence with laughing, sneezing, and coughing. Recently worse in the last couple months. Not on medication for about a year. - Denies burining with urination. Endorses chronic frequency and urgency, voids ~6x per day.  - never been to pelvic PT  Cough - getting over cold about a month ago and recent worsening of allergies (itchy, watery eyes as well) - taking mucinex dm BID as needed helps. Using albuterol once per day, helps some, lasts for 2 hours. - COPD exacerbation 02/12/2018, (txed with prednisone burst, bactrim, albuterol, mucinex, CPAP at night) - someitmes is coughing so severely gets scared and can't catch breath, heart starts racing - feels she has gotten better since exacerbation, still has productive cough with sputum, unsure what color as she swallows it. - endorses runny nose, itchy and watery eyes, sometimes wheezing, chronic acid reflux with no recent worsening - denies fever, worsening of chronic shortness of breath, chest pain, leg swelling, hemoptysis, weight loss.  Review of Systems:  Per HPI.   [place]: reviewed. Smoking status reviewed. Medications reviewed.  Objective:   BP 138/70 (BP Location: Right Arm, Patient Position: Sitting, Cuff Size: Large)   Pulse 81   Temp 98.9 F (37.2 C) (Oral)   Ht 5\' 2"  (1.575 m)   Wt 218 lb (98.9 kg)   SpO2 95%   BMI 39.87 kg/m  Vitals and nursing note reviewed.  General: morbidly obese female in no acute distress with non-toxic appearance HEENT:  normocephalic, atraumatic, moist mucous membranes. Sweating on exam. Neck: supple, non-tender without lymphadenopathy CV: regular rate and rhythm with systolic murmur Lungs: clear to auscultation bilaterally with normal work of breathing and slightly prolonged expiratory phase. Abdomen: soft, non-tender, obese abdomen, no masses or organomegaly palpable, normoactive bowel sounds Skin: warm, dry, no rashes or lesions Extremities: warm and well perfused, normal tone MSK: ROM grossly intact, strength intact, gait normal Neuro: Alert and oriented, speech normal  Assessment & Plan:   Mixed incontinence On chart review, started having problems after TAH. Previously on myrbetriq and oxybutnin. On chart review has been prescribed vesicare in the past year but patient states she has not been on medication for this for about a year. Will provide refill. Will also refer for pelvic PT. UA obtained due to recent worsening. Did have few leuks and bacteria, however no dysuria present and given history of worsening over past few months, do not believe to be infectious in etiology. Likely combination of no controller medication and increased coughing due to recent COPD exacerbation and allergy flare.   Cough Likely due to residual lung irritation from COPD exacerbation with allergies contributing. Afebrile with clear lung exam with slight prolonged expiratory phase. Recommended continued use of PRN albuterol and mucinex. Prescribed claritin. Last seen by Pulmonology in 2018 with PFTs at that time and attributed shortness of breath to physical deconditioning. No need for controller inhaler at this time. Return precautions given.  Orders Placed This Encounter  Procedures  . Ambulatory referral to Physical Therapy  Referral Priority:   Routine    Referral Type:   Physical Medicine    Referral Reason:   Specialty Services Required    Requested Specialty:   Physical Therapy    Number of Visits Requested:   1    . POCT urinalysis dipstick  . POCT UA - Microscopic Only   Meds ordered this encounter  Medications  . solifenacin (VESICARE) 10 MG tablet    Sig: Take 1 tablet (10 mg total) by mouth daily.    Dispense:  90 tablet    Refill:  3  . loratadine (CLARITIN) tablet 10 mg    [name], DO PGY-1, [facility] 04/14/2018 5:38 PM

## 2018-04-14 NOTE — Assessment & Plan Note (Deleted)
Seems to be more of a mixed picture. On chart review, started having problems after TAH. Previously on myrbetriq and oxybutnin. On chart review has been prescribed vesicare in the past year but patient states she has not been on medication for this for about a year. Will provide refill. Will also refer for pelvic PT. UA obtained due to recent worsening. Did have few leuks and bacteria, however no dysuria present and given history of worsening over past few months, do not believe to be infectious in etiology. Likely combination of no controller medication and increased coughing due to recent COPD exacerbation and allergy flare.

## 2018-04-14 NOTE — Assessment & Plan Note (Signed)
On chart review, started having problems after TAH. Previously on myrbetriq and oxybutnin. On chart review has been prescribed vesicare in the past year but patient states she has not been on medication for this for about a year. Will provide refill. Will also refer for pelvic PT. UA obtained due to recent worsening. Did have few leuks and bacteria, however no dysuria present and given history of worsening over past few months, do not believe to be infectious in etiology. Likely combination of no controller medication and increased coughing due to recent COPD exacerbation and allergy flare.

## 2018-04-15 ENCOUNTER — Telehealth: Payer: Self-pay | Admitting: Family Medicine

## 2018-04-15 NOTE — Telephone Encounter (Signed)
Will forward to [name].  Kotaro Buer,CMA

## 2018-04-15 NOTE — Telephone Encounter (Signed)
Pt called about the medication that was prescribed yesterday for her weak bladder and the pharmacy told her it would cost $300 with her insurance. Pharmacy told her there is another medication that would cost less and its called oxy-something, but its not oxycodone. Please contact pt to get this figured out.

## 2018-04-16 ENCOUNTER — Encounter (INDEPENDENT_AMBULATORY_CARE_PROVIDER_SITE_OTHER): Payer: Medicare Other | Admitting: Ophthalmology

## 2018-04-16 DIAGNOSIS — H35033 Hypertensive retinopathy, bilateral: Secondary | ICD-10-CM

## 2018-04-16 DIAGNOSIS — I10 Essential (primary) hypertension: Secondary | ICD-10-CM | POA: Diagnosis not present

## 2018-04-16 DIAGNOSIS — H43813 Vitreous degeneration, bilateral: Secondary | ICD-10-CM | POA: Diagnosis not present

## 2018-04-16 DIAGNOSIS — H353231 Exudative age-related macular degeneration, bilateral, with active choroidal neovascularization: Secondary | ICD-10-CM

## 2018-04-16 DIAGNOSIS — H33302 Unspecified retinal break, left eye: Secondary | ICD-10-CM

## 2018-04-21 ENCOUNTER — Other Ambulatory Visit: Payer: Self-pay | Admitting: Family Medicine

## 2018-04-21 MED ORDER — OXYBUTYNIN CHLORIDE ER 5 MG PO TB24: 5.0000 mg | ORAL_TABLET | Freq: Every day | ORAL | 0 refills | Status: DC

## 2018-04-21 NOTE — Telephone Encounter (Signed)
Spoke with patient. She was to try oxybutynin despite previous reaction. States when she was on it before she had increased frequency of UTIs. Given she wants to retry, advised her to come to the clinic for urine specimen if she develops symptoms. Patient in agreement.  [name], DO PGY-1, [facility] 04/21/2018 10:57 AM

## 2018-04-23 ENCOUNTER — Other Ambulatory Visit (INDEPENDENT_AMBULATORY_CARE_PROVIDER_SITE_OTHER): Payer: Medicare Other

## 2018-04-23 ENCOUNTER — Telehealth: Payer: Self-pay | Admitting: Family Medicine

## 2018-04-23 DIAGNOSIS — R300 Dysuria: Secondary | ICD-10-CM

## 2018-04-23 LAB — POCT URINALYSIS DIP (MANUAL ENTRY)
BILIRUBIN UA: NEGATIVE mg/dL
Bilirubin, UA: NEGATIVE
Glucose, UA: NEGATIVE mg/dL
NITRITE UA: NEGATIVE
PH UA: 5.5 (ref 5.0–8.0)
Spec Grav, UA: 1.025 (ref 1.010–1.025)
Urobilinogen, UA: 0.2 E.U./dL

## 2018-04-23 LAB — POCT UA - MICROSCOPIC ONLY

## 2018-04-23 MED ORDER — CEPHALEXIN 500 MG PO CAPS: 500.0000 mg | ORAL_CAPSULE | Freq: Two times a day (BID) | ORAL | 0 refills | Status: AC

## 2018-04-23 NOTE — Telephone Encounter (Signed)
Spoke with patient regarding increased pain and urinary frequency since last visit. She returned to the lab for repeat urine sample. U/A results similar to previous. Given increased pain and UTI symptoms, will treat with antibiotics. Will call in Keflex x7 days. Advised patient to return if symptoms no better in about a week.  [name], DO PGY-1, [facility] 04/23/2018 4:37 PM

## 2018-04-26 ENCOUNTER — Other Ambulatory Visit: Payer: Self-pay | Admitting: Family Medicine

## 2018-04-28 NOTE — Telephone Encounter (Signed)
Pt has only 2 days left of keflex left. She is not getting any better from uti. She wants to talk to dr Ky Barban to see what to do next.

## 2018-05-11 ENCOUNTER — Other Ambulatory Visit: Payer: Self-pay | Admitting: *Deleted

## 2018-05-11 MED ORDER — OPTICHAMBER DIAMOND DEVI: 1 refills | Status: DC

## 2018-05-11 NOTE — Telephone Encounter (Signed)
Patient is requesting a spacer be sent to her pharmacy to aid when she uses her inhaler.  Patient informed them that she needed more time to inhale.  Jazmin Hartsell,CMA

## 2018-05-13 ENCOUNTER — Other Ambulatory Visit: Payer: Self-pay | Admitting: Family Medicine

## 2018-05-22 ENCOUNTER — Other Ambulatory Visit: Payer: Self-pay

## 2018-05-29 ENCOUNTER — Telehealth: Payer: Self-pay | Admitting: Family Medicine

## 2018-05-29 ENCOUNTER — Other Ambulatory Visit: Payer: Self-pay | Admitting: Cardiovascular Disease

## 2018-05-29 ENCOUNTER — Encounter (INDEPENDENT_AMBULATORY_CARE_PROVIDER_SITE_OTHER): Payer: Medicare Other | Admitting: Ophthalmology

## 2018-05-29 DIAGNOSIS — Z1231 Encounter for screening mammogram for malignant neoplasm of breast: Secondary | ICD-10-CM

## 2018-05-29 NOTE — Telephone Encounter (Signed)
Will forward to MD to advise. Aston Lawhorn,CMA  

## 2018-05-29 NOTE — Telephone Encounter (Signed)
Pt called requesting a refill on her Lyrica. Pt said she was changed to taking 1 pill a day instead of 2 and she has not been able to sleep so she decided herself to start back taking 2 pills a day and she's been sleeping good again. Pt wants a refill on it but since it's so expensive, she wants to know if the refill can be written like Rosalyn Gess use to do for her and be written to take 3 a day but she will only take 2 a day so she will have 30 leftover at the end of each month to carry over into the next month. Pt said her Rx was cheaper when done this way. Please call pt to discuss this. [facility] [address]

## 2018-06-01 ENCOUNTER — Other Ambulatory Visit: Payer: Self-pay | Admitting: Family Medicine

## 2018-06-01 MED ORDER — PREGABALIN 75 MG PO CAPS: 150.0000 mg | ORAL_CAPSULE | Freq: Every day | ORAL | 0 refills | Status: DC

## 2018-06-01 NOTE — Telephone Encounter (Signed)
Refill sent. Thanks

## 2018-06-08 ENCOUNTER — Encounter (INDEPENDENT_AMBULATORY_CARE_PROVIDER_SITE_OTHER): Payer: Medicare Other | Admitting: Ophthalmology

## 2018-06-08 DIAGNOSIS — H43813 Vitreous degeneration, bilateral: Secondary | ICD-10-CM | POA: Diagnosis not present

## 2018-06-08 DIAGNOSIS — H35033 Hypertensive retinopathy, bilateral: Secondary | ICD-10-CM

## 2018-06-08 DIAGNOSIS — H34211 Partial retinal artery occlusion, right eye: Secondary | ICD-10-CM

## 2018-06-08 DIAGNOSIS — H353231 Exudative age-related macular degeneration, bilateral, with active choroidal neovascularization: Secondary | ICD-10-CM | POA: Diagnosis not present

## 2018-06-08 DIAGNOSIS — I10 Essential (primary) hypertension: Secondary | ICD-10-CM

## 2018-06-18 DIAGNOSIS — I422 Other hypertrophic cardiomyopathy: Secondary | ICD-10-CM | POA: Diagnosis not present

## 2018-06-18 DIAGNOSIS — I425 Other restrictive cardiomyopathy: Secondary | ICD-10-CM | POA: Diagnosis not present

## 2018-06-18 DIAGNOSIS — I2510 Atherosclerotic heart disease of native coronary artery without angina pectoris: Secondary | ICD-10-CM | POA: Diagnosis not present

## 2018-06-18 DIAGNOSIS — R0602 Shortness of breath: Secondary | ICD-10-CM | POA: Diagnosis not present

## 2018-06-23 ENCOUNTER — Ambulatory Visit
Admission: RE | Admit: 2018-06-23 | Discharge: 2018-06-23 | Disposition: A | Discharge status: Home / Self Care | Payer: Medicare Other | Source: Ambulatory Visit | Attending: Cardiovascular Disease | Admitting: Cardiovascular Disease

## 2018-06-23 DIAGNOSIS — Z1231 Encounter for screening mammogram for malignant neoplasm of breast: Secondary | ICD-10-CM | POA: Diagnosis not present

## 2018-07-10 ENCOUNTER — Other Ambulatory Visit: Payer: Self-pay

## 2018-07-10 MED ORDER — PREGABALIN 75 MG PO CAPS: 150.0000 mg | ORAL_CAPSULE | Freq: Every day | ORAL | 0 refills | Status: DC

## 2018-07-10 NOTE — Telephone Encounter (Signed)
[facility]. Was sent under sign in from [facility]. Please re-send but log in from [facility].  Patient call back is [phone]  Danley Danker, RN [facility] RN)

## 2018-07-29 ENCOUNTER — Encounter (INDEPENDENT_AMBULATORY_CARE_PROVIDER_SITE_OTHER): Payer: Medicare Other | Admitting: Ophthalmology

## 2018-07-31 ENCOUNTER — Telehealth: Payer: Self-pay | Admitting: Hematology

## 2018-07-31 NOTE — Telephone Encounter (Signed)
YF PAL 9/18 - moved appointments to 9/24 w/LB. Spoke with patient.

## 2018-08-07 ENCOUNTER — Other Ambulatory Visit: Payer: Medicare Other

## 2018-08-07 ENCOUNTER — Ambulatory Visit: Payer: Medicare Other | Admitting: Hematology

## 2018-08-07 ENCOUNTER — Ambulatory Visit: Payer: Medicare Other

## 2018-08-09 ENCOUNTER — Telehealth: Payer: Self-pay | Admitting: Family Medicine

## 2018-08-10 NOTE — Telephone Encounter (Signed)
Pharmacy comment: Alternative Requested:PT PAYING $ 38 FOR OXYBU. PT REQUESTING LOWER OUT-OF-POCKET COST. SEE ALTERNATIVES AND ASSOCIATED COST: DARIF $5.

## 2018-08-11 ENCOUNTER — Encounter: Payer: Self-pay | Admitting: Nurse Practitioner

## 2018-08-11 ENCOUNTER — Other Ambulatory Visit: Payer: Self-pay | Admitting: Family Medicine

## 2018-08-11 ENCOUNTER — Inpatient Hospital Stay: Payer: Medicare Other | Attending: Hematology

## 2018-08-11 ENCOUNTER — Inpatient Hospital Stay: Payer: Medicare Other

## 2018-08-11 ENCOUNTER — Telehealth: Payer: Self-pay

## 2018-08-11 ENCOUNTER — Inpatient Hospital Stay (HOSPITAL_BASED_OUTPATIENT_CLINIC_OR_DEPARTMENT_OTHER): Payer: Medicare Other | Admitting: Nurse Practitioner

## 2018-08-11 VITALS — BP 146/68 | HR 67 | Temp 98.6°F | Resp 18 | Ht 62.0 in | Wt 218.8 lb

## 2018-08-11 DIAGNOSIS — D72829 Elevated white blood cell count, unspecified: Secondary | ICD-10-CM | POA: Insufficient documentation

## 2018-08-11 DIAGNOSIS — M8580 Other specified disorders of bone density and structure, unspecified site: Secondary | ICD-10-CM

## 2018-08-11 DIAGNOSIS — G8929 Other chronic pain: Secondary | ICD-10-CM | POA: Insufficient documentation

## 2018-08-11 DIAGNOSIS — Z170 Estrogen receptor positive status [ER+]: Secondary | ICD-10-CM

## 2018-08-11 DIAGNOSIS — I10 Essential (primary) hypertension: Secondary | ICD-10-CM | POA: Diagnosis not present

## 2018-08-11 DIAGNOSIS — Z853 Personal history of malignant neoplasm of breast: Secondary | ICD-10-CM | POA: Insufficient documentation

## 2018-08-11 DIAGNOSIS — M545 Low back pain: Secondary | ICD-10-CM

## 2018-08-11 DIAGNOSIS — C50511 Malignant neoplasm of lower-outer quadrant of right female breast: Secondary | ICD-10-CM

## 2018-08-11 LAB — CBC WITH DIFFERENTIAL/PLATELET
BASOS PCT: 1 %
Basophils Absolute: 0.1 10*3/uL (ref 0.0–0.1)
EOS ABS: 0.7 10*3/uL — AB (ref 0.0–0.5)
Eosinophils Relative: 6 %
HCT: 36.1 % (ref 34.8–46.6)
Hemoglobin: 11.7 g/dL (ref 11.6–15.9)
LYMPHS ABS: 2.4 10*3/uL (ref 0.9–3.3)
Lymphocytes Relative: 19 %
MCH: 27.3 pg (ref 25.1–34.0)
MCHC: 32.3 g/dL (ref 31.5–36.0)
MCV: 84.5 fL (ref 79.5–101.0)
MONO ABS: 0.9 10*3/uL (ref 0.1–0.9)
MONOS PCT: 7 %
Neutro Abs: 8.4 10*3/uL — ABNORMAL HIGH (ref 1.5–6.5)
Neutrophils Relative %: 67 %
Platelets: 276 10*3/uL (ref 145–400)
RBC: 4.28 MIL/uL (ref 3.70–5.45)
RDW: 15.7 % — AB (ref 11.2–14.5)
WBC: 12.5 10*3/uL — ABNORMAL HIGH (ref 3.9–10.3)

## 2018-08-11 LAB — COMPREHENSIVE METABOLIC PANEL
ALBUMIN: 3.2 g/dL — AB (ref 3.5–5.0)
ALK PHOS: 118 U/L (ref 38–126)
ALT: 20 U/L (ref 0–44)
ANION GAP: 7 (ref 5–15)
AST: 36 U/L (ref 15–41)
BILIRUBIN TOTAL: 0.4 mg/dL (ref 0.3–1.2)
BUN: 15 mg/dL (ref 8–23)
CALCIUM: 9.3 mg/dL (ref 8.9–10.3)
CO2: 27 mmol/L (ref 22–32)
Chloride: 107 mmol/L (ref 98–111)
Creatinine, Ser: 0.8 mg/dL (ref 0.44–1.00)
GFR calc Af Amer: >60 mL/min (ref >60)
GLUCOSE: 102 mg/dL — AB (ref 70–99)
POTASSIUM: 4.5 mmol/L (ref 3.5–5.1)
Sodium: 141 mmol/L (ref 135–145)
TOTAL PROTEIN: 6.3 g/dL — AB (ref 6.5–8.1)

## 2018-08-11 MED ORDER — DARIFENACIN HYDROBROMIDE ER 7.5 MG PO TB24: 7.5000 mg | ORAL_TABLET | Freq: Every day | ORAL | 1 refills | Status: DC

## 2018-08-11 MED ORDER — DENOSUMAB 60 MG/ML ~~LOC~~ SOSY: 60.0000 mg | PREFILLED_SYRINGE | Freq: Once | SUBCUTANEOUS | 0 refills | Status: AC

## 2018-08-11 MED ORDER — DENOSUMAB 60 MG/ML ~~LOC~~ SOSY
60.0000 mg | PREFILLED_SYRINGE | Freq: Once | SUBCUTANEOUS | Status: AC
  Administered 2018-08-11: 60 mg via SUBCUTANEOUS

## 2018-08-11 NOTE — Telephone Encounter (Signed)
Sent with directions to take daily.
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
## 2018-08-11 NOTE — Telephone Encounter (Signed)
Pharmacy needs specific directions. Martha Mora, Martha Mora, CMA

## 2018-08-11 NOTE — Telephone Encounter (Signed)
Printed avs and calender of upcoming appointment. Per 9/24 los 

## 2018-08-13 ENCOUNTER — Encounter (INDEPENDENT_AMBULATORY_CARE_PROVIDER_SITE_OTHER): Payer: Medicare Other | Admitting: Ophthalmology

## 2018-08-13 DIAGNOSIS — I10 Essential (primary) hypertension: Secondary | ICD-10-CM

## 2018-08-13 DIAGNOSIS — H353231 Exudative age-related macular degeneration, bilateral, with active choroidal neovascularization: Secondary | ICD-10-CM | POA: Diagnosis not present

## 2018-08-13 DIAGNOSIS — H33302 Unspecified retinal break, left eye: Secondary | ICD-10-CM

## 2018-08-13 DIAGNOSIS — H43813 Vitreous degeneration, bilateral: Secondary | ICD-10-CM | POA: Diagnosis not present

## 2018-08-13 DIAGNOSIS — H35033 Hypertensive retinopathy, bilateral: Secondary | ICD-10-CM | POA: Diagnosis not present

## 2018-08-13 NOTE — Progress Notes (Deleted)
  Subjective:   Patient ID: Martha Mora    DOB: 1946/01/31, 72 y.o. female   MRN: [id number]  Martha Mora is a 72 y.o. female with a history of *** here for ***  *** - ***  Review of Systems:  Per HPI.   [place], medications and smoking status reviewed.  Objective:   There were no vitals taken for this visit. Vitals and nursing note reviewed.  General: well nourished, well developed, in no acute distress with non-toxic appearance HEENT: normocephalic, atraumatic, moist mucous membranes Neck: supple, non-tender without lymphadenopathy CV: regular rate and rhythm without murmurs, rubs, or gallops, no lower extremity edema Lungs: clear to auscultation bilaterally with normal work of breathing Abdomen: soft, non-tender, non-distended, no masses or organomegaly palpable, normoactive bowel sounds Skin: warm, dry, no rashes or lesions Extremities: warm and well perfused, normal tone MSK: ROM grossly intact, strength intact, gait normal Neuro: Alert and oriented, speech normal  Assessment & Plan:   No problem-specific Assessment & Plan notes found for this encounter.  No orders of the defined types were placed in this encounter.  No orders of the defined types were placed in this encounter.   [name], DO PGY-2, [facility] 08/13/2018 11:39 AM

## 2018-08-14 ENCOUNTER — Ambulatory Visit: Payer: Medicare Other | Admitting: Family Medicine

## 2018-08-18 ENCOUNTER — Telehealth: Payer: Self-pay | Admitting: Family Medicine

## 2018-08-18 NOTE — Telephone Encounter (Signed)
Will forward to MD. Jazmin Hartsell,CMA  

## 2018-08-18 NOTE — Telephone Encounter (Signed)
Pt wanted to let [name] know that she is having terrible issues with her restless leg syndrome. She is not able to sleep well and now her whole body is feeling the way her legs were originally. She says this has started since she adjusted the dose of her lyrica. She would like for [name] to call her at [phone].

## 2018-08-18 NOTE — Telephone Encounter (Signed)
Returned patient's call. Patient states she is having a hard time getting to sleep and maintaining sleep. She is taking trazodone and lyrica for RLS. She states that "her whole body feels like she has RLS." She denies pain, states it feels more like an intense irritation that she can't get rid of to get back to sleep. Was instructed by her cardiologist not to take more than 2 lyrica at a time. Initially increasing her lyrica dose to 2 pills helped with her sleep. Also states CPAP is not working correctly currently. Is expecting a call back from supplier tomorrow. States she has never changed the filter. Instructed that if her symptoms do not improve after fixing CPAP and changing filter, to return to clinic to be evaluated.  [name], DO PGY-2, [facility] 08/18/2018 5:10 PM

## 2018-08-21 ENCOUNTER — Telehealth: Payer: Self-pay | Admitting: Family Medicine

## 2018-08-21 ENCOUNTER — Other Ambulatory Visit: Payer: Self-pay | Admitting: Pharmacist

## 2018-08-21 NOTE — Patient Outreach (Signed)
[facility]) Care Management  08/21/2018  ALEXA BLISH 03-24-1946 [id number]  Received call back from patient. She states that she is "having an anxiety attack" and asked what she should take. I recommended she contact her PCP's office for further instruction and advice.   Catie Darnelle Maffucci, PharmD PGY2 Ambulatory Care Pharmacy Resident, [facility] Phone: [phone]

## 2018-08-21 NOTE — Telephone Encounter (Signed)
Patient has an appointment on Monday for this reason and just spoke with PCP regarding this same concern.  Will address on Monday when she comes in.  [facility]

## 2018-08-21 NOTE — Telephone Encounter (Signed)
Pt is calling and would like to speak to her doctor about her legs and sleep. jw

## 2018-08-21 NOTE — Patient Outreach (Addendum)
[facility]) Care Management  08/21/2018   Martha Mora Mar 08, 1946 [id number]  Subjective:   72 year old female referred to [facility] for medication review due to High Risk review of [employer] beneficiaries. PMHx includes, but not limited to, CAD, HTN, sleep apnea, restless leg, OSA, depression/anxiety, h/o breast cancer.   Patient was contacted today to review medications due to multiple [facility] codes and elevated RAF score. HIPAA verifiers identified.   Sleep/Restless Leg Syndrome/Pain - She notes that for the past ~ 5 weeks, she has had significant worsening of her restless leg syndrome and her ability to sleep. She notes that she uses CPAP, but as soon as she falls asleep, she seems to be jolted awake. She also describes her RLS as a full-body sensation. She says that she changed from brand Lyrica to generic pregabalin about 2 months ago, and is unsure if this is related  - She notes that she has two prescriptions for diclofenac, and has been taking 75 mg QAM and 50 mg QPM with acetaminophen 2 g daily and pregabalin 150 mg QHS  CAD: - She notes that her cardiologist [name]) recently started her on rosuvastatin 20 mg daily and increased aspirin to 325 mg daily d/t finding a "plaque in her eye" that has since resolved  Shortness of Breath: - She notes that she has had significantly increased SOB recently, and has been using albuterol HFA more frequently.   Objective:   Current Medications:  Current Outpatient Medications  Medication Sig Dispense Refill  . acetaminophen (TYLENOL) 500 MG tablet Take 500 mg by mouth every 6 (six) hours as needed.     [name] albuterol (VENTOLIN HFA) 108 (90 Base) MCG/ACT inhaler 1-2 inhalations every 4-6 hours as needed for wheezing. Dispense spacer as needed. 6.7 g 2  . amLODipine (NORVASC) 5 MG tablet Take 5 mg by mouth daily.  2  . aspirin 325 MG tablet Take 325 mg by mouth daily.    . Calcium  Carbonate-Vitamin D 600-400 MG-UNIT per tablet Take 1 tablet by mouth 2 (two) times daily.     . carvedilol (COREG) 3.125 MG tablet Take 3.125 mg by mouth 2 (two) times daily.  0  . citalopram (CELEXA) 40 MG tablet Take 0.5 tablets (20 mg total) by mouth 2 (two) times daily. (Patient taking differently: Take 40 mg by mouth daily. ) 90 tablet 3  . diclofenac (VOLTAREN) 50 MG EC tablet Take 50 mg by mouth 2 (two) times daily.   0  . losartan (COZAAR) 100 MG tablet Take 100 mg by mouth every evening.     . Multiple Vitamin (MULTIVITAMIN) tablet Take 1 tablet by mouth daily. Herbal life multivitamin    . Multiple Vitamins-Minerals (HEALTHY EYES/LUTEIN PO) Take 1 tablet by mouth daily.    . Omega-3 Fatty Acids (FISH OIL) 1000 MG CAPS Take 1 capsule by mouth daily.     [name] omeprazole (PRILOSEC) 40 MG capsule TAKE 1 CAPSULE (40 MG TOTAL) BY MOUTH DAILY. 30 capsule 6  . oxybutynin (DITROPAN) 5 MG tablet Take 5 mg by mouth daily.    . pregabalin (LYRICA) 75 MG capsule Take 2 capsules (150 mg total) by mouth at bedtime. May take up to 3 at bedtime. 270 capsule 0  . rosuvastatin (CRESTOR) 20 MG tablet Take 20 mg by mouth daily.    . traZODone (DESYREL) 50 MG tablet TAKE 1 TABLET (50 MG TOTAL) BY MOUTH AT BEDTIME AS NEEDED FOR SLEEP. 90 tablet  1  . vitamin E 200 UNIT capsule Take 200 Units by mouth daily.    [name] guaiFENesin (MUCINEX) 600 MG 12 hr tablet Take 1 tablet (600 mg total) by mouth 2 (two) times daily. (Patient not taking: Reported on 08/11/2018) 7 tablet 0  . nitroGLYCERIN (NITROSTAT) 0.4 MG SL tablet Place 1 tablet (0.4 mg total) under the tongue every 5 (five) minutes x 3 doses as needed for chest pain. (Patient not taking: Reported on 08/11/2018) 25 tablet 12   No current facility-administered medications for this visit.    BMP Latest Ref Rng & Units 08/11/2018 02/06/2018 08/08/2017  Glucose 70 - 99 mg/dL 102(H) 104 95  BUN 8 - 23 mg/dL 15 11 21.7  Creatinine 0.44 - 1.00 mg/dL 0.80 0.86 0.8  Sodium  135 - 145 mmol/L 141 143 141  Potassium 3.5 - 5.1 mmol/L 4.5 4.0 4.0  Chloride 98 - 111 mmol/L 107 110(H) -  CO2 22 - 32 mmol/L '27 24 25  ' Calcium 8.9 - 10.3 mg/dL 9.3 9.1 9.0   eGFR >60 mL/min/1.71m,  CrCl >100 mL/min  Assessment:   Drugs sorted by system:  Neurologic/Psychologic: citalopram, trazodone  Cardiovascular: amlodipine, aspirin, carvedilol, losartan, omega-3 fatty acids, rosuvastatin  Pulmonary/Allergy: albuterol HFA  Gastrointestinal: omeprazole  Endocrine: Denosumab Q6 months  Pain: acetaminophen, diclofenac PO, pregabalin  Genitourinary: oxybutynin  Vitamins/Minerals/Supplements: calcium carbonate/vitamin D, multivitamins, vitamin E  Drug-Drug Interactions: - Patient concurrently taking diclofenac 115 mg daily and aspirin 325 mg daily, which increases her bleed risk, particularly for GI bleeds. However, she is also on daily PPI; continue to monitor for bleeds, consider alternative analgesic options to reduce the need for diclofenac therapy; unfortunately, topical lidocaine patches and diclofenac gel appear to both be Tier 4 on the patient's formulary. OTC topical options could be trialed.  Geriatric Considerations: - Patient is >65 years and taking citalopram doses >20 mg daily, which increases her risk for QT prolongation. Last EKG was normal (05/2016), at that time, it appears she was on this same dose of citalopram; however, consider a dose reduction or changing to an alternative SSRI that does not carry the same elevated risk of QT prolongation - particularly if trazodone doses are increased in the future  Plan:  - Recommended patient f/u with PCP regarding sleep difficulties, RLS/pain concerns, and breathing concerns. She has an appointment next week at the clinic; I recommended that she bring all current medications to this visit. Will route note to PCP and attending MD that will be seeing her [facility] PharmD PGY2 Ambulatory Care Pharmacy  Resident, [place]Phone: [phone]

## 2018-08-24 ENCOUNTER — Ambulatory Visit: Payer: Medicare Other | Admitting: Family Medicine

## 2018-09-07 ENCOUNTER — Encounter: Payer: Self-pay | Admitting: Internal Medicine

## 2018-09-16 ENCOUNTER — Other Ambulatory Visit: Payer: Self-pay | Admitting: *Deleted

## 2018-09-23 ENCOUNTER — Other Ambulatory Visit: Payer: Self-pay | Admitting: *Deleted

## 2018-09-23 MED ORDER — OXYBUTYNIN CHLORIDE 5 MG PO TABS: 5.0000 mg | ORAL_TABLET | Freq: Every day | ORAL | 3 refills | Status: DC

## 2018-09-23 NOTE — Telephone Encounter (Signed)
Pt states that she is out of medication. [place], [name], CMA

## 2018-09-28 ENCOUNTER — Ambulatory Visit: Payer: Medicare Other

## 2018-10-02 ENCOUNTER — Other Ambulatory Visit: Payer: Self-pay

## 2018-10-05 ENCOUNTER — Telehealth: Payer: Self-pay | Admitting: Family Medicine

## 2018-10-05 NOTE — Telephone Encounter (Signed)
Patient informed that she needed to be seen before it was refilled again.  She already has an appointment tomorrow with PCP and will discuss at this time.  Jvon Meroney,CMA

## 2018-10-05 NOTE — Telephone Encounter (Signed)
Pt called to check on the status of her pregabalin being refilled. It looks like it has been refused more than once. Pt would like to know what she needs to do to have this filled. Please call pt at [phone].

## 2018-10-05 NOTE — Progress Notes (Signed)
Subjective:   Patient ID: Martha Mora    DOB: 1946/10/26, 72 y.o. female   MRN: [id number]  Martha Mora is a 72 y.o. female with a history of HTN, sleep apnea, GERD, IBS, OA, HLD, h/o breast cancer, obesity, anxiety, depression, RLS, insomnia, urinary incontinence here for   Restless legs Patient reports that she is still having issues with controlling her restless legs.  She has tried many different medications in the past for this and is currently on Lyrica.  She takes up to 2 pills of 75 mg at night, however reports this does not fully control her symptoms.  She states that 3 pills of Lyrica fully controls her symptoms, however was told by cardiology not to take more than 2 pills at night.  She also takes trazodone at night.  Since the last time she was seen she has had her CPAP fixed and reports it is now working although she is still having difficulties with initiating and staying asleep.  She has a normal bedtime routine that she follows every night but does note that she watches TV and is on her phone frequently before bed.  She goes to bed around 10 PM and wakes up around 2 AM, has to take a shower to calm down her restless legs and is able to go back to sleep around 3 AM, wakes up around 8 AM.  Drinks caffeinated coffee about twice daily with about 1 meal per day.  Does not exercise.  Healthcare Maintenance - Vaccines: UTD - Colonoscopy: due - Mammogram: UTD - Pap Smear: N/A - DEXA Scan: UTD - Lipid Panel: due  Urine odor Endorses terrible odor to urine, wonders if she has a urinary tract infection.  Denies any pain with urination.  Denies blood in urine, new back pain, fevers.  Review of Systems:  Per HPI.  [place], medications and smoking status reviewed.  Objective:   BP 140/80   Pulse 69   Temp 98.6 F (37 C) (Oral)   Wt 212 lb 12.8 oz (96.5 kg)   SpO2 91%   BMI 38.92 kg/m  Vitals and nursing note reviewed.  General: Obese female, in no acute distress with non-toxic  appearance CV: regular rate and rhythm without murmurs, rubs, or gallops, no lower extremity edema Lungs: clear to auscultation bilaterally with normal work of breathing Skin: warm, dry, no rashes or lesions Extremities: warm and well perfused, normal tone MSK: ROM grossly intact, 5/5 strength to LE bilaterally, gait normal O Neuro: Alert and oriented, speech normal  Assessment & Plan:   Obesity, unspecified Likely contributing to hypertension, restless leg syndrome, hyperlipidemia.  Discussed lifestyle modifications through diet and exercise in attempt to lose weight.  Patient declines nutrition referral.  Handout provided.  Patient to follow-up in 1 month.  Restless leg syndrome Patient not having great control despite 150 mg of Lyrica daily at bedtime.  Will trial low-dose ropinirole with directions to self titrate over the course of 1 week.  Also advised continued conservative measures including proper sleep hygiene with no screen time 1 hour before bed, diet and exercise changes, and frequent stretching before bed.  Patient will follow-up in 1 month.  UTI Urinalysis indicative of UTI, will prescribe Keflex and send urine for culture.  Healthcare maintenance Patient to make appointment with GI for colonoscopy.  Fasting lipid panel obtained today.  Orders Placed This Encounter  Procedures  . Urine Culture  . Lipid Panel  . TSH  . POCT urinalysis dipstick  .  POCT UA - Microscopic Only   Meds ordered this encounter  Medications  . rOPINIRole (REQUIP) 0.25 MG tablet    Sig: Take 1 tablet (0.25 mg total) by mouth at bedtime. After 2 days, may increase to 2 tablets. After 7 days may increase to 4 tabs at bedtime.    Dispense:  96 tablet    Refill:  0  . cephALEXin (KEFLEX) 500 MG capsule    Sig: Take 1 capsule (500 mg total) by mouth 2 (two) times daily.    Dispense:  14 capsule    Refill:  0    [name], DO PGY-2, [facility] 10/06/2018 3:02 PM

## 2018-10-06 ENCOUNTER — Encounter: Payer: Self-pay | Admitting: Family Medicine

## 2018-10-06 ENCOUNTER — Ambulatory Visit: Payer: Medicare Other | Admitting: Family Medicine

## 2018-10-06 VITALS — BP 140/80 | HR 69 | Temp 98.6°F | Wt 212.8 lb

## 2018-10-06 DIAGNOSIS — R8290 Unspecified abnormal findings in urine: Secondary | ICD-10-CM

## 2018-10-06 DIAGNOSIS — E7849 Other hyperlipidemia: Secondary | ICD-10-CM | POA: Diagnosis not present

## 2018-10-06 DIAGNOSIS — G2581 Restless legs syndrome: Secondary | ICD-10-CM | POA: Diagnosis not present

## 2018-10-06 DIAGNOSIS — Z6838 Body mass index (BMI) 38.0-38.9, adult: Secondary | ICD-10-CM

## 2018-10-06 DIAGNOSIS — I10 Essential (primary) hypertension: Secondary | ICD-10-CM

## 2018-10-06 DIAGNOSIS — N3001 Acute cystitis with hematuria: Secondary | ICD-10-CM

## 2018-10-06 DIAGNOSIS — E6609 Other obesity due to excess calories: Secondary | ICD-10-CM

## 2018-10-06 HISTORY — DX: Unspecified abnormal findings in urine: R82.90

## 2018-10-06 LAB — POCT URINALYSIS DIP (MANUAL ENTRY)
Bilirubin, UA: NEGATIVE
GLUCOSE UA: NEGATIVE mg/dL
Ketones, POC UA: NEGATIVE mg/dL
NITRITE UA: POSITIVE — AB
Spec Grav, UA: >=1.03 — AB (ref 1.010–1.025)
UROBILINOGEN UA: 0.2 U/dL
pH, UA: 5.5 (ref 5.0–8.0)

## 2018-10-06 LAB — POCT UA - MICROSCOPIC ONLY

## 2018-10-06 MED ORDER — CEPHALEXIN 500 MG PO CAPS: 500.0000 mg | ORAL_CAPSULE | Freq: Two times a day (BID) | ORAL | 0 refills | Status: DC

## 2018-10-06 MED ORDER — ROPINIROLE HCL 0.25 MG PO TABS: 0.2500 mg | ORAL_TABLET | Freq: Every day | ORAL | 0 refills | Status: DC

## 2018-10-06 NOTE — Assessment & Plan Note (Addendum)
Likely contributing to hypertension, restless leg syndrome, hyperlipidemia.  Discussed lifestyle modifications through diet and exercise in attempt to lose weight.  Patient declines nutrition referral.  Handout provided.  Patient to follow-up in 1 month.

## 2018-10-06 NOTE — Assessment & Plan Note (Signed)
Patient not having great control despite 150 mg of Lyrica daily at bedtime.  Will trial low-dose ropinirole with directions to self titrate over the course of 1 week.  Also advised continued conservative measures including proper sleep hygiene with no screen time 1 hour before bed, diet and exercise changes, and frequent stretching before bed.  Patient will follow-up in 1 month.

## 2018-10-06 NOTE — Patient Instructions (Signed)
It was great to see you!  Our plans for today:  - Eat 3 meals per day. See below for tips. - Work on increasing your exercise, try walking more. - I will call your pharmacy about your Lyrica.  - Avoid screens before bed, adapt a sleep schedule. - We are checking some labs today, we will call you or send you a letter if they are abnormal.   Take care and seek immediate care sooner if you develop any concerns.   Dr. [facility]  Here is an example of what a healthy plate looks like:   ? Make half your plate fruits and vegetables.     ? Focus on whole fruits.     ? Vary your veggies.  ? Make half your grains whole grains. -     ? Look for the word "whole" at the beginning of the ingredients list    ? Some whole-grain ingredients include whole oats, whole-wheat flour,        whole-grain corn, whole-grain brown rice, and whole rye.  ? Move to low-fat and fat-free milk or yogurt.  ? Vary your protein routine. - Meat, fish, poultry (chicken, [place]), eggs, beans (kidney, pinto), dairy.  ? Drink and eat less sodium, saturated fat, and added sugars.  Look for opportunities to move your body throughout your day:  Never lie down when you can sit; never sit when you can stand; never stand when you can pace.  Moving your body throughout the day is just as important as the 30 or 60 minutes of exercise at the gym!  Get social Get active with your friends instead of going out to eat. Go for a hike, walk around the mall, or play an exercise-themed video game.   Move more at work Fit more activity into the workday. Stand during phone calls, use a printer farther from your desk, and get up to stretch each hour.    Do something new Develop a new skill to kick-start your motivation. Sign up for a class to learn how to [employer], surf, do tai chi, or play a sport.    Keep cool in the pool Don't like to sweat? Hit the local community pool for a swim, water polo, or water  aerobics class to stay cool while exercising.    Stay on track Use a fitness tracker (FITBIT, Fitness Pal mobile app) to track your activity and provide motivation to reach your goals.

## 2018-10-07 ENCOUNTER — Other Ambulatory Visit: Payer: Self-pay | Admitting: Family Medicine

## 2018-10-07 LAB — LIPID PANEL
CHOL/HDL RATIO: 3.7 ratio (ref 0.0–4.4)
Cholesterol, Total: 153 mg/dL (ref 100–199)
HDL: 41 mg/dL (ref >39)
LDL Calculated: 80 mg/dL (ref 0–99)
TRIGLYCERIDES: 161 mg/dL — AB (ref 0–149)
VLDL Cholesterol Cal: 32 mg/dL (ref 5–40)

## 2018-10-07 LAB — TSH: TSH: 1.45 u[IU]/mL (ref 0.450–4.500)

## 2018-10-07 MED ORDER — ZOSTER VAC RECOMB ADJUVANTED 50 MCG/0.5ML IM SUSR: 0.5000 mL | Freq: Once | INTRAMUSCULAR | 0 refills | Status: AC

## 2018-10-08 ENCOUNTER — Telehealth: Payer: Self-pay | Admitting: Family Medicine

## 2018-10-08 ENCOUNTER — Other Ambulatory Visit: Payer: Self-pay | Admitting: Family Medicine

## 2018-10-08 ENCOUNTER — Encounter (INDEPENDENT_AMBULATORY_CARE_PROVIDER_SITE_OTHER): Payer: Medicare Other | Admitting: Ophthalmology

## 2018-10-08 MED ORDER — NITROFURANTOIN MONOHYD MACRO 100 MG PO CAPS: 100.0000 mg | ORAL_CAPSULE | Freq: Two times a day (BID) | ORAL | 0 refills | Status: AC

## 2018-10-08 NOTE — Telephone Encounter (Signed)
Pt called and wanted to let [name] know that the Keflex is upsetting her stomach even if she eats before taking the medication. Pt would like to know if she can send a different antibiotic to her pharmacy. Please call pt and let her know if this can be done.

## 2018-10-08 NOTE — Telephone Encounter (Signed)
Changed to macrobid. Sent to [facility] on [address]. She should stop taking keflex.

## 2018-10-08 NOTE — Telephone Encounter (Signed)
Will forward to [name].  Jazmin Hartsell,CMA

## 2018-10-09 NOTE — Telephone Encounter (Signed)
Patient informed and will pick up medication today. Itzayana Pardy,CMA

## 2018-10-12 ENCOUNTER — Encounter: Payer: Self-pay | Admitting: Internal Medicine

## 2018-10-19 ENCOUNTER — Encounter (INDEPENDENT_AMBULATORY_CARE_PROVIDER_SITE_OTHER): Payer: Medicare Other | Admitting: Ophthalmology

## 2018-10-19 DIAGNOSIS — H33302 Unspecified retinal break, left eye: Secondary | ICD-10-CM

## 2018-10-19 DIAGNOSIS — H35033 Hypertensive retinopathy, bilateral: Secondary | ICD-10-CM

## 2018-10-19 DIAGNOSIS — I10 Essential (primary) hypertension: Secondary | ICD-10-CM | POA: Diagnosis not present

## 2018-10-19 DIAGNOSIS — H353231 Exudative age-related macular degeneration, bilateral, with active choroidal neovascularization: Secondary | ICD-10-CM | POA: Diagnosis not present

## 2018-10-19 DIAGNOSIS — H43813 Vitreous degeneration, bilateral: Secondary | ICD-10-CM | POA: Diagnosis not present

## 2018-10-24 ENCOUNTER — Other Ambulatory Visit: Payer: Self-pay | Admitting: Family Medicine

## 2018-10-28 ENCOUNTER — Other Ambulatory Visit: Payer: Self-pay | Admitting: Family Medicine

## 2018-10-28 NOTE — Telephone Encounter (Signed)
Refill provided, please have patient follow up for restless legs.

## 2018-10-29 ENCOUNTER — Telehealth: Payer: Self-pay

## 2018-10-29 NOTE — Telephone Encounter (Signed)
Patient left message that she was recently treated for uti but symptoms have returned. Wants to know if another abx can be called in.  Call back is [phone]  Danley Danker, RN [facility])

## 2018-10-29 NOTE — Telephone Encounter (Signed)
LVM asking pt to call the office to followup with [place].

## 2018-10-29 NOTE — Telephone Encounter (Signed)
She should either come by for another appointment or just stop by for a lab visit to obtain another U/A and culture to determine the best abx regimen. If she develops fevers or back pain, she should either come in to be seen or go to the ED.

## 2018-11-02 NOTE — Telephone Encounter (Signed)
LM for patient ok DPR asking her to call the office and make a lab appt to get another urine sample.  Tyonna Talerico,CMA

## 2018-11-09 ENCOUNTER — Encounter: Payer: Self-pay | Admitting: Internal Medicine

## 2018-11-09 ENCOUNTER — Other Ambulatory Visit: Payer: Self-pay

## 2018-11-09 ENCOUNTER — Ambulatory Visit (AMBULATORY_SURGERY_CENTER): Payer: Self-pay

## 2018-11-09 VITALS — Ht 61.5 in | Wt 216.0 lb

## 2018-11-09 DIAGNOSIS — Z86010 Personal history of colonic polyps: Secondary | ICD-10-CM

## 2018-11-09 MED ORDER — NA SULFATE-K SULFATE-MG SULF 17.5-3.13-1.6 GM/177ML PO SOLN: 1.0000 | Freq: Once | ORAL | 0 refills | Status: AC
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
## 2018-11-18 DIAGNOSIS — J449 Chronic obstructive pulmonary disease, unspecified: Secondary | ICD-10-CM

## 2018-11-18 HISTORY — DX: Chronic obstructive pulmonary disease, unspecified: J44.9

## 2018-11-23 ENCOUNTER — Encounter: Payer: Medicare Other | Admitting: Internal Medicine

## 2018-11-25 ENCOUNTER — Other Ambulatory Visit: Payer: Self-pay | Admitting: Family Medicine

## 2018-11-26 ENCOUNTER — Telehealth: Payer: Self-pay | Admitting: Internal Medicine

## 2018-11-26 NOTE — Telephone Encounter (Signed)
Hi Henrene Pastor, this pt just cancelled her colonoscopy scheduled on 11/30/18 with you because she is sick. She has rescheduled to 12/08/18. Thank you.

## 2018-11-30 ENCOUNTER — Telehealth: Payer: Self-pay

## 2018-11-30 ENCOUNTER — Encounter: Payer: Medicare Other | Admitting: Internal Medicine

## 2018-11-30 NOTE — Telephone Encounter (Signed)
Pt LVM on nurse line stating the requip isnt helping her. Pt states she feels she is having a "reaction" to the medication. In VM pt stated she feels like her "whole body feels to be in restless leg." Pt would like for something else to be called in. Please advise.

## 2018-12-02 ENCOUNTER — Other Ambulatory Visit: Payer: Self-pay | Admitting: Family Medicine

## 2018-12-02 DIAGNOSIS — G2581 Restless legs syndrome: Secondary | ICD-10-CM

## 2018-12-02 NOTE — Telephone Encounter (Signed)
Spoke to patient. Based off of symptoms, some concern for augmentation of RLS given earlier onset of symptoms, breakthrough symptoms at night, progression of symptoms to other parts of her body, and long term use of ropinirole. Will trial splitting up dosing of ropinirole, currently takes 2mg  before bed (had increased symptoms with taking 3mg ). Instructed to take 1 mg around 7pm (symptoms begin about 8:30pm) and 1 mg before she goes to bed. Also instructed patient to come for a lab visit to check iron levels as she has been low in the past and this can worsen symptoms. Patient does have CPAP but hasn't been able to wear lately due to symptoms, so could also be contributing. If symptoms are not improved within a few weeks to a month with dose splitting and if labs are normal, will have patient make appointment to discuss further options. Order placed for labs.  [name], DO PGY-2, [facility] 12/02/2018 11:18 AM

## 2018-12-05 ENCOUNTER — Ambulatory Visit (HOSPITAL_COMMUNITY)
Admission: EM | Admit: 2018-12-05 | Discharge: 2018-12-05 | Disposition: A | Discharge status: Home / Self Care | Payer: Medicare Other | Attending: Family Medicine | Admitting: Family Medicine

## 2018-12-05 ENCOUNTER — Other Ambulatory Visit: Payer: Self-pay

## 2018-12-05 ENCOUNTER — Encounter (HOSPITAL_COMMUNITY): Payer: Self-pay

## 2018-12-05 DIAGNOSIS — J441 Chronic obstructive pulmonary disease with (acute) exacerbation: Secondary | ICD-10-CM | POA: Insufficient documentation

## 2018-12-05 MED ORDER — PREDNISONE 20 MG PO TABS: 20.0000 mg | ORAL_TABLET | Freq: Two times a day (BID) | ORAL | 0 refills | Status: AC

## 2018-12-05 MED ORDER — AZITHROMYCIN 250 MG PO TABS: 250.0000 mg | ORAL_TABLET | Freq: Every day | ORAL | 0 refills | Status: DC

## 2018-12-05 NOTE — ED Triage Notes (Signed)
Pt cc she thinks she has bronchitis. X 2 weeks

## 2018-12-05 NOTE — Discharge Instructions (Signed)
Declines breathing treatment today Continue with inhaler at home as needed for shortness of breath, and/or wheezing Get plenty of rest and push fluids Prednisone prescribed.  Take as directed and to completion Azithromycin prescribed.  Take as directed and to completion Follow up with PCP for recheck and/or if symptoms persists Return or go to ER if you have any new or worsening symptoms such as fever, chills, fatigue, shortness of breath, wheezing, chest pain, nausea, changes in bowel or bladder habits, etc..[name]
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
## 2018-12-08 ENCOUNTER — Encounter: Payer: Medicare Other | Admitting: Internal Medicine

## 2018-12-08 DIAGNOSIS — M47816 Spondylosis without myelopathy or radiculopathy, lumbar region: Secondary | ICD-10-CM | POA: Diagnosis not present

## 2018-12-10 ENCOUNTER — Other Ambulatory Visit: Payer: Medicare Other

## 2018-12-10 ENCOUNTER — Telehealth: Payer: Self-pay | Admitting: Family Medicine

## 2018-12-10 DIAGNOSIS — G2581 Restless legs syndrome: Secondary | ICD-10-CM

## 2018-12-10 DIAGNOSIS — I2510 Atherosclerotic heart disease of native coronary artery without angina pectoris: Secondary | ICD-10-CM | POA: Diagnosis not present

## 2018-12-10 DIAGNOSIS — Z23 Encounter for immunization: Secondary | ICD-10-CM

## 2018-12-10 DIAGNOSIS — I10 Essential (primary) hypertension: Secondary | ICD-10-CM | POA: Diagnosis not present

## 2018-12-10 DIAGNOSIS — R0602 Shortness of breath: Secondary | ICD-10-CM | POA: Diagnosis not present

## 2018-12-10 DIAGNOSIS — R072 Precordial pain: Secondary | ICD-10-CM | POA: Diagnosis not present

## 2018-12-10 MED ORDER — ZOSTER VAC RECOMB ADJUVANTED 50 MCG/0.5ML IM SUSR: 0.5000 mL | Freq: Once | INTRAMUSCULAR | 0 refills | Status: AC

## 2018-12-10 NOTE — Telephone Encounter (Signed)
Spoke with patient and let her know that I could send her shingrix script to the pharmacy.  Patient said this would be fine and pharmacy verified.  Jazmin Hartsell,CMA

## 2018-12-10 NOTE — Telephone Encounter (Signed)
Patient came to pick up an older prescription regarding shingles .  She does not know what it was called.  It is written in the log book up front from November but it is not in the file.  Can you call patient to discuss and maybe issue another prescription?  Thanks  (Best # [phone])

## 2018-12-11 LAB — CBC
Hematocrit: 39.8 % (ref 34.0–46.6)
Hemoglobin: 12.7 g/dL (ref 11.1–15.9)
MCH: 26.5 pg — ABNORMAL LOW (ref 26.6–33.0)
MCHC: 31.9 g/dL (ref 31.5–35.7)
MCV: 83 fL (ref 79–97)
Platelets: 297 10*3/uL (ref 150–450)
RBC: 4.8 x10E6/uL (ref 3.77–5.28)
RDW: 14.9 % (ref 11.7–15.4)
WBC: 17.8 10*3/uL — ABNORMAL HIGH (ref 3.4–10.8)

## 2018-12-11 LAB — IRON,TIBC AND FERRITIN PANEL
Ferritin: 16 ng/mL (ref 15–150)
Iron Saturation: 10 % — ABNORMAL LOW (ref 15–55)
Iron: 38 ug/dL (ref 27–139)
Total Iron Binding Capacity: 374 ug/dL (ref 250–450)
UIBC: 336 ug/dL (ref 118–369)

## 2018-12-14 ENCOUNTER — Encounter (INDEPENDENT_AMBULATORY_CARE_PROVIDER_SITE_OTHER): Payer: Medicare Other | Admitting: Ophthalmology

## 2018-12-14 DIAGNOSIS — H35033 Hypertensive retinopathy, bilateral: Secondary | ICD-10-CM

## 2018-12-14 DIAGNOSIS — H33302 Unspecified retinal break, left eye: Secondary | ICD-10-CM | POA: Diagnosis not present

## 2018-12-14 DIAGNOSIS — I10 Essential (primary) hypertension: Secondary | ICD-10-CM

## 2018-12-14 DIAGNOSIS — H353231 Exudative age-related macular degeneration, bilateral, with active choroidal neovascularization: Secondary | ICD-10-CM

## 2018-12-14 DIAGNOSIS — H43813 Vitreous degeneration, bilateral: Secondary | ICD-10-CM

## 2018-12-15 ENCOUNTER — Telehealth: Payer: Self-pay

## 2018-12-15 NOTE — Telephone Encounter (Signed)
LVM for patient to return my call 

## 2018-12-15 NOTE — Telephone Encounter (Signed)
-----   Message from [name], DO sent at 12/11/2018 12:42 PM EST ----- Please let patient know her iron levels and hemoglobin are normal. She should continue the dose splitting of her ropinirole like we talked about previously. If her symptoms are still present after a few weeks to a month, she should come back for an appointment.

## 2018-12-23 NOTE — Telephone Encounter (Signed)
That's fine. I'll see her in follow up as scheduled.

## 2018-12-23 NOTE — Telephone Encounter (Signed)
Patient called back and was given message. Stated what was discussed was not working and she started taking 3 Ropinirole and 1.5 Trazodone and she is sleeping well with no leg problems. Routed to PCP.  Danley Danker, RN [facility] RN)

## 2018-12-28 ENCOUNTER — Encounter: Payer: Self-pay | Admitting: Internal Medicine

## 2018-12-28 ENCOUNTER — Ambulatory Visit (AMBULATORY_SURGERY_CENTER): Payer: Medicare Other | Admitting: Internal Medicine

## 2018-12-28 VITALS — BP 107/59 | HR 63 | Temp 98.0°F | Resp 16 | Ht 62.0 in | Wt 212.0 lb

## 2018-12-28 DIAGNOSIS — Z1211 Encounter for screening for malignant neoplasm of colon: Secondary | ICD-10-CM | POA: Diagnosis not present

## 2018-12-28 DIAGNOSIS — K56699 Other intestinal obstruction unspecified as to partial versus complete obstruction: Secondary | ICD-10-CM | POA: Diagnosis not present

## 2018-12-28 DIAGNOSIS — K5730 Diverticulosis of large intestine without perforation or abscess without bleeding: Secondary | ICD-10-CM | POA: Diagnosis not present

## 2018-12-28 DIAGNOSIS — Z86010 Personal history of colonic polyps: Secondary | ICD-10-CM | POA: Diagnosis not present

## 2018-12-28 MED ORDER — SODIUM CHLORIDE 0.9 % IV SOLN: 500.0000 mL | Freq: Once | INTRAVENOUS | Status: DC

## 2018-12-28 NOTE — Patient Instructions (Signed)
Discharge instructions given. Office will schedule Virtual colonoscopy due to incomplete colonoscopy. (Due to sigmoid stenosis/obstruction.) Resume previous medications. YOU HAD AN ENDOSCOPIC PROCEDURE TODAY AT [facility]:   Refer to the procedure report that was given to you for any specific questions about what was found during the examination.  If the procedure report does not answer your questions, please call your gastroenterologist to clarify.  If you requested that your care partner not be given the details of your procedure findings, then the procedure report has been included in a sealed envelope for you to review at your convenience later.  YOU SHOULD EXPECT: Some feelings of bloating in the abdomen. Passage of more gas than usual.  Walking can help get rid of the air that was put into your GI tract during the procedure and reduce the bloating. If you had a lower endoscopy (such as a colonoscopy or flexible sigmoidoscopy) you may notice spotting of blood in your stool or on the toilet paper. If you underwent a bowel prep for your procedure, you may not have a normal bowel movement for a few days.  Please Note:  You might notice some irritation and congestion in your nose or some drainage.  This is from the oxygen used during your procedure.  There is no need for concern and it should clear up in a day or so.  SYMPTOMS TO REPORT IMMEDIATELY:   Following lower endoscopy (colonoscopy or flexible sigmoidoscopy):  Excessive amounts of blood in the stool  Significant tenderness or worsening of abdominal pains  Swelling of the abdomen that is new, acute  Fever of 100F or higher   For urgent or emergent issues, a gastroenterologist can be reached at any hour by calling [phone].   DIET:  We do recommend a small meal at first, but then you may proceed to your regular diet.  Drink plenty of fluids but you should avoid alcoholic beverages for 24 hours.  ACTIVITY:  You  should plan to take it easy for the rest of today and you should NOT DRIVE or use heavy machinery until tomorrow (because of the sedation medicines used during the test).    FOLLOW UP: Our staff will call the number listed on your records the next business day following your procedure to check on you and address any questions or concerns that you may have regarding the information given to you following your procedure. If we do not reach you, we will leave a message.  However, if you are feeling well and you are not experiencing any problems, there is no need to return our call.  We will assume that you have returned to your regular daily activities without incident.  If any biopsies were taken you will be contacted by phone or by letter within the next 1-3 weeks.  Please call us at [phone] if you have not heard about the biopsies in 3 weeks.    SIGNATURES/CONFIDENTIALITY: You and/or your care partner have signed paperwork which will be entered into your electronic medical record.  These signatures attest to the fact that that the information above on your After Visit Summary has been reviewed and is understood.  Full responsibility of the confidentiality of this discharge information lies with you and/or your care-partner.

## 2018-12-28 NOTE — Op Note (Signed)
[facility] Patient Name: Martha Mora Procedure Date: 12/28/2018 10:00 AM MRN: [id number] Endoscopist: Docia Chuck. Henrene Pastor , MD Age: 73 Referring MD:  Date of Birth: March 10, 1946 Gender: Female Account #: [id number] Procedure:                Colonoscopy Indications:              High risk colon cancer surveillance: Personal                            history of non-advanced adenomas. Previous                            examinations 2003, 2006, 2009 Medicines:                Monitored Anesthesia Care Procedure:                Pre-Anesthesia Assessment:                           - Prior to the procedure, a History and Physical                            was performed, and patient medications and                            allergies were reviewed. The patient's tolerance of                            previous anesthesia was also reviewed. The risks                            and benefits of the procedure and the sedation                            options and risks were discussed with the patient.                            All questions were answered, and informed consent                            was obtained. Prior Anticoagulants: The patient has                            taken no previous anticoagulant or antiplatelet                            agents. ASA Grade Assessment: II - A patient with                            mild systemic disease. After reviewing the risks                            and benefits, the patient was deemed in  satisfactory condition to undergo the procedure.                           After obtaining informed consent, the colonoscope                            was passed under direct vision. Throughout the                            procedure, the patient's blood pressure, pulse, and                            oxygen saturations were monitored continuously. The                            Colonoscope was introduced through the anus  with                            the intention of advancing to the cecum. The scope                            was advanced to the sigmoid colon before the                            procedure was aborted. Medications were given. The                            rectum was photographed. The quality of the bowel                            preparation was excellent. The colonoscopy was                            performed without difficulty. The patient tolerated                            the procedure well. The bowel preparation used was                            SUPREP. Scope In: 10:14:50 AM Scope Out: 10:26:51 AM Total Procedure Duration: 0 hours 12 minutes 1 second  Findings:                 Multiple small and large-mouthed diverticula were                            found in the sigmoid colon. There was marked                            stenosis that would not permit passage of the                            standard adult scope or the pediatric colonoscope.  The exam was otherwise without abnormality on                            direct and retroflexion views. Complications:            No immediate complications. Estimated blood loss:                            None. Estimated Blood Loss:     Estimated blood loss: none. Impression:               - Diverticulosis in the sigmoid colon with severe                            stenosis. Incomplete exam as described.                           - The examination was otherwise normal on direct                            and retroflexion views.                           - No specimens collected. Recommendation:           - Repeat colonoscopy is not recommended for                            surveillance.                           - Patient has a contact number available for                            emergencies. The signs and symptoms of potential                            delayed complications were discussed with the                             patient. Return to normal activities tomorrow.                            Written discharge instructions were provided to the                            patient.                           - Resume previous diet.                           - Continue present medications.                           - Schedule virtual colonoscopy "incomplete  colonoscopy due to sigmoid stenosis/obstruction.                            Likely diverticular stenosis. Evaluate" Docia Chuck. Henrene Pastor, MD 12/28/2018 10:33:35 AM This report has been signed electronically.

## 2018-12-28 NOTE — Progress Notes (Signed)
To PACU, VSS. Report to Rn.tb 

## 2018-12-29 ENCOUNTER — Telehealth: Payer: Self-pay

## 2018-12-29 NOTE — Telephone Encounter (Signed)
Left message on answering machine. 

## 2018-12-29 NOTE — Telephone Encounter (Signed)
  Follow up Call-  Call back number 12/28/2018  Post procedure Call Back phone  # [phone]  Permission to leave phone message Yes  Some recent data might be hidden     Patient questions:  Do you have a fever, pain , or abdominal swelling? No. Pain Score  0 *  Have you tolerated food without any problems? Yes.    Have you been able to return to your normal activities? Yes.    Do you have any questions about your discharge instructions: Diet   No. Medications  No. Follow up visit  No.  Do you have questions or concerns about your Care? No.  Actions: * If pain score is 4 or above: No action needed, pain <4.

## 2018-12-31 ENCOUNTER — Telehealth: Payer: Self-pay

## 2018-12-31 NOTE — Telephone Encounter (Signed)
Left message for patient to call back to office to be scheduled for a VCE as recommended by Dr. Henrene Pastor;

## 2019-01-01 ENCOUNTER — Other Ambulatory Visit: Payer: Self-pay

## 2019-01-01 DIAGNOSIS — K56699 Other intestinal obstruction unspecified as to partial versus complete obstruction: Secondary | ICD-10-CM

## 2019-01-01 NOTE — Telephone Encounter (Addendum)
Called and spoke with patient-patient agreed with plan of care and scheduled video capsule endoscopy for 01/12/2019 arrival at 7:45am for an 8:00am appt; patient given verbal instructions and also mailed instructions for clarity; Patient was advised to call back if questions/concerns arise;Patient verbalized understanding of information/instructions;

## 2019-01-05 ENCOUNTER — Ambulatory Visit (INDEPENDENT_AMBULATORY_CARE_PROVIDER_SITE_OTHER)
Admission: RE | Admit: 2019-01-05 | Discharge: 2019-01-05 | Disposition: A | Discharge status: Home / Self Care | Payer: Medicare Other | Source: Ambulatory Visit | Attending: Acute Care | Admitting: Acute Care

## 2019-01-05 ENCOUNTER — Other Ambulatory Visit: Payer: Self-pay

## 2019-01-05 DIAGNOSIS — K56699 Other intestinal obstruction unspecified as to partial versus complete obstruction: Secondary | ICD-10-CM

## 2019-01-05 DIAGNOSIS — Z87891 Personal history of nicotine dependence: Secondary | ICD-10-CM

## 2019-01-05 DIAGNOSIS — Z122 Encounter for screening for malignant neoplasm of respiratory organs: Secondary | ICD-10-CM

## 2019-01-05 NOTE — Progress Notes (Signed)
Capsule endo appt cancelled; Virtual colonoscopy order placed in Epic; Patient information faxed to [facility] -they report they will inform the patient of appt date/time, prep instructions, and information regarding the virtual colonoscopy;   Called and spoke with patient-Patient informed of ordering error for capsule and new order for virtual colonoscopy; Patient informed that [facility] would be in contact with her and provide information/instructions; Patient was advised to call back if questions/concerns arise;Patient verbalized understanding of information/instructions;

## 2019-01-07 ENCOUNTER — Other Ambulatory Visit: Payer: Self-pay | Admitting: Acute Care

## 2019-01-07 ENCOUNTER — Inpatient Hospital Stay: Admission: RE | Admit: 2019-01-07 | Payer: Medicare Other | Source: Ambulatory Visit

## 2019-01-07 DIAGNOSIS — Z87891 Personal history of nicotine dependence: Secondary | ICD-10-CM

## 2019-01-07 DIAGNOSIS — Z122 Encounter for screening for malignant neoplasm of respiratory organs: Secondary | ICD-10-CM

## 2019-01-09 ENCOUNTER — Other Ambulatory Visit: Payer: Self-pay | Admitting: Family Medicine

## 2019-01-11 ENCOUNTER — Other Ambulatory Visit: Payer: Self-pay | Admitting: Family Medicine

## 2019-01-11 ENCOUNTER — Telehealth: Payer: Self-pay

## 2019-01-11 MED ORDER — ROPINIROLE HCL 3 MG PO TABS: 3.0000 mg | ORAL_TABLET | Freq: Every day | ORAL | 2 refills | Status: DC

## 2019-01-11 NOTE — Telephone Encounter (Signed)
Pt called nurse line stating she needs a new rx sent in to reflect the frequency she takes requip. The 90day rx was written, take one at bedtime. The patient states she takes 3 at bedtime. Please advise.

## 2019-01-11 NOTE — Telephone Encounter (Signed)
Rx sent 

## 2019-01-21 ENCOUNTER — Ambulatory Visit
Admission: RE | Admit: 2019-01-21 | Discharge: 2019-01-21 | Disposition: A | Discharge status: Home / Self Care | Payer: Medicare Other | Source: Ambulatory Visit | Attending: Internal Medicine | Admitting: Internal Medicine

## 2019-01-21 DIAGNOSIS — K56699 Other intestinal obstruction unspecified as to partial versus complete obstruction: Secondary | ICD-10-CM

## 2019-01-21 DIAGNOSIS — N200 Calculus of kidney: Secondary | ICD-10-CM | POA: Diagnosis not present

## 2019-02-04 ENCOUNTER — Telehealth: Payer: Self-pay | Admitting: Hematology

## 2019-02-04 NOTE — Telephone Encounter (Signed)
Patient called to reschedule and MD said it was ok for June

## 2019-02-09 ENCOUNTER — Other Ambulatory Visit: Payer: Medicare Other

## 2019-02-09 ENCOUNTER — Encounter (INDEPENDENT_AMBULATORY_CARE_PROVIDER_SITE_OTHER): Payer: Medicare Other | Admitting: Ophthalmology

## 2019-02-09 ENCOUNTER — Ambulatory Visit: Payer: Medicare Other

## 2019-02-11 ENCOUNTER — Encounter (INDEPENDENT_AMBULATORY_CARE_PROVIDER_SITE_OTHER): Payer: Medicare Other | Admitting: Ophthalmology

## 2019-02-17 DIAGNOSIS — J189 Pneumonia, unspecified organism: Secondary | ICD-10-CM

## 2019-02-17 DIAGNOSIS — Z87442 Personal history of urinary calculi: Secondary | ICD-10-CM

## 2019-02-17 HISTORY — DX: Personal history of urinary calculi: Z87.442

## 2019-02-17 HISTORY — DX: Pneumonia, unspecified organism: J18.9

## 2019-02-21 ENCOUNTER — Inpatient Hospital Stay (HOSPITAL_COMMUNITY)
Admission: EM | Admit: 2019-02-21 | Discharge: 2019-03-10 | DRG: 853 | Disposition: A | Discharge status: Home Health | Payer: Medicare Other | Attending: Family Medicine | Admitting: Family Medicine

## 2019-02-21 ENCOUNTER — Encounter (HOSPITAL_COMMUNITY): Payer: Self-pay | Admitting: Emergency Medicine

## 2019-02-21 ENCOUNTER — Emergency Department (HOSPITAL_COMMUNITY): Payer: Medicare Other

## 2019-02-21 DIAGNOSIS — F419 Anxiety disorder, unspecified: Secondary | ICD-10-CM | POA: Diagnosis present

## 2019-02-21 DIAGNOSIS — I2510 Atherosclerotic heart disease of native coronary artery without angina pectoris: Secondary | ICD-10-CM | POA: Diagnosis not present

## 2019-02-21 DIAGNOSIS — Z981 Arthrodesis status: Secondary | ICD-10-CM

## 2019-02-21 DIAGNOSIS — A4151 Sepsis due to Escherichia coli [E. coli]: Secondary | ICD-10-CM | POA: Diagnosis not present

## 2019-02-21 DIAGNOSIS — E870 Hyperosmolality and hypernatremia: Secondary | ICD-10-CM | POA: Diagnosis not present

## 2019-02-21 DIAGNOSIS — J189 Pneumonia, unspecified organism: Secondary | ICD-10-CM

## 2019-02-21 DIAGNOSIS — Z79899 Other long term (current) drug therapy: Secondary | ICD-10-CM

## 2019-02-21 DIAGNOSIS — N390 Urinary tract infection, site not specified: Secondary | ICD-10-CM | POA: Diagnosis not present

## 2019-02-21 DIAGNOSIS — Z419 Encounter for procedure for purposes other than remedying health state, unspecified: Secondary | ICD-10-CM

## 2019-02-21 DIAGNOSIS — A419 Sepsis, unspecified organism: Secondary | ICD-10-CM

## 2019-02-21 DIAGNOSIS — Z9049 Acquired absence of other specified parts of digestive tract: Secondary | ICD-10-CM

## 2019-02-21 DIAGNOSIS — N179 Acute kidney failure, unspecified: Secondary | ICD-10-CM | POA: Diagnosis present

## 2019-02-21 DIAGNOSIS — K219 Gastro-esophageal reflux disease without esophagitis: Secondary | ICD-10-CM | POA: Diagnosis not present

## 2019-02-21 DIAGNOSIS — N136 Pyonephrosis: Secondary | ICD-10-CM | POA: Diagnosis not present

## 2019-02-21 DIAGNOSIS — Z8249 Family history of ischemic heart disease and other diseases of the circulatory system: Secondary | ICD-10-CM

## 2019-02-21 DIAGNOSIS — I480 Paroxysmal atrial fibrillation: Secondary | ICD-10-CM | POA: Diagnosis not present

## 2019-02-21 DIAGNOSIS — Z9851 Tubal ligation status: Secondary | ICD-10-CM

## 2019-02-21 DIAGNOSIS — G9349 Other encephalopathy: Secondary | ICD-10-CM | POA: Diagnosis not present

## 2019-02-21 DIAGNOSIS — R6521 Severe sepsis with septic shock: Secondary | ICD-10-CM | POA: Diagnosis not present

## 2019-02-21 DIAGNOSIS — E876 Hypokalemia: Secondary | ICD-10-CM | POA: Diagnosis present

## 2019-02-21 DIAGNOSIS — R6520 Severe sepsis without septic shock: Secondary | ICD-10-CM | POA: Diagnosis not present

## 2019-02-21 DIAGNOSIS — I110 Hypertensive heart disease with heart failure: Secondary | ICD-10-CM | POA: Diagnosis present

## 2019-02-21 DIAGNOSIS — M160 Bilateral primary osteoarthritis of hip: Secondary | ICD-10-CM | POA: Diagnosis present

## 2019-02-21 DIAGNOSIS — Z9841 Cataract extraction status, right eye: Secondary | ICD-10-CM

## 2019-02-21 DIAGNOSIS — K5900 Constipation, unspecified: Secondary | ICD-10-CM | POA: Diagnosis not present

## 2019-02-21 DIAGNOSIS — I10 Essential (primary) hypertension: Secondary | ICD-10-CM | POA: Diagnosis not present

## 2019-02-21 DIAGNOSIS — Z20828 Contact with and (suspected) exposure to other viral communicable diseases: Secondary | ICD-10-CM | POA: Diagnosis present

## 2019-02-21 DIAGNOSIS — E785 Hyperlipidemia, unspecified: Secondary | ICD-10-CM | POA: Diagnosis present

## 2019-02-21 DIAGNOSIS — E860 Dehydration: Secondary | ICD-10-CM | POA: Diagnosis not present

## 2019-02-21 DIAGNOSIS — I5031 Acute diastolic (congestive) heart failure: Secondary | ICD-10-CM | POA: Diagnosis present

## 2019-02-21 DIAGNOSIS — M4690 Unspecified inflammatory spondylopathy, site unspecified: Secondary | ICD-10-CM | POA: Diagnosis not present

## 2019-02-21 DIAGNOSIS — G9340 Encephalopathy, unspecified: Secondary | ICD-10-CM

## 2019-02-21 DIAGNOSIS — Z9842 Cataract extraction status, left eye: Secondary | ICD-10-CM

## 2019-02-21 DIAGNOSIS — J440 Chronic obstructive pulmonary disease with acute lower respiratory infection: Secondary | ICD-10-CM | POA: Diagnosis present

## 2019-02-21 DIAGNOSIS — Z6841 Body Mass Index (BMI) 40.0 and over, adult: Secondary | ICD-10-CM

## 2019-02-21 DIAGNOSIS — R0602 Shortness of breath: Secondary | ICD-10-CM | POA: Diagnosis not present

## 2019-02-21 DIAGNOSIS — I361 Nonrheumatic tricuspid (valve) insufficiency: Secondary | ICD-10-CM | POA: Diagnosis not present

## 2019-02-21 DIAGNOSIS — Z4659 Encounter for fitting and adjustment of other gastrointestinal appliance and device: Secondary | ICD-10-CM

## 2019-02-21 DIAGNOSIS — Z91013 Allergy to seafood: Secondary | ICD-10-CM

## 2019-02-21 DIAGNOSIS — Z853 Personal history of malignant neoplasm of breast: Secondary | ICD-10-CM

## 2019-02-21 DIAGNOSIS — R32 Unspecified urinary incontinence: Secondary | ICD-10-CM | POA: Diagnosis present

## 2019-02-21 DIAGNOSIS — Z452 Encounter for adjustment and management of vascular access device: Secondary | ICD-10-CM

## 2019-02-21 DIAGNOSIS — Z800 Family history of malignant neoplasm of digestive organs: Secondary | ICD-10-CM

## 2019-02-21 DIAGNOSIS — J9601 Acute respiratory failure with hypoxia: Secondary | ICD-10-CM

## 2019-02-21 DIAGNOSIS — M810 Age-related osteoporosis without current pathological fracture: Secondary | ICD-10-CM | POA: Diagnosis present

## 2019-02-21 DIAGNOSIS — G4700 Insomnia, unspecified: Secondary | ICD-10-CM | POA: Diagnosis present

## 2019-02-21 DIAGNOSIS — Z923 Personal history of irradiation: Secondary | ICD-10-CM

## 2019-02-21 DIAGNOSIS — G931 Anoxic brain damage, not elsewhere classified: Secondary | ICD-10-CM | POA: Diagnosis present

## 2019-02-21 DIAGNOSIS — J9621 Acute and chronic respiratory failure with hypoxia: Secondary | ICD-10-CM | POA: Diagnosis present

## 2019-02-21 DIAGNOSIS — F39 Unspecified mood [affective] disorder: Secondary | ICD-10-CM | POA: Diagnosis present

## 2019-02-21 DIAGNOSIS — I340 Nonrheumatic mitral (valve) insufficiency: Secondary | ICD-10-CM | POA: Diagnosis not present

## 2019-02-21 DIAGNOSIS — Z825 Family history of asthma and other chronic lower respiratory diseases: Secondary | ICD-10-CM

## 2019-02-21 DIAGNOSIS — G4733 Obstructive sleep apnea (adult) (pediatric): Secondary | ICD-10-CM | POA: Diagnosis not present

## 2019-02-21 DIAGNOSIS — Z978 Presence of other specified devices: Secondary | ICD-10-CM

## 2019-02-21 DIAGNOSIS — Z955 Presence of coronary angioplasty implant and graft: Secondary | ICD-10-CM

## 2019-02-21 DIAGNOSIS — R0902 Hypoxemia: Secondary | ICD-10-CM | POA: Diagnosis not present

## 2019-02-21 DIAGNOSIS — I499 Cardiac arrhythmia, unspecified: Secondary | ICD-10-CM

## 2019-02-21 DIAGNOSIS — R05 Cough: Secondary | ICD-10-CM | POA: Diagnosis not present

## 2019-02-21 DIAGNOSIS — G2581 Restless legs syndrome: Secondary | ICD-10-CM | POA: Diagnosis present

## 2019-02-21 DIAGNOSIS — F329 Major depressive disorder, single episode, unspecified: Secondary | ICD-10-CM | POA: Diagnosis present

## 2019-02-21 DIAGNOSIS — Z0189 Encounter for other specified special examinations: Secondary | ICD-10-CM

## 2019-02-21 DIAGNOSIS — I252 Old myocardial infarction: Secondary | ICD-10-CM

## 2019-02-21 DIAGNOSIS — D6489 Other specified anemias: Secondary | ICD-10-CM | POA: Diagnosis present

## 2019-02-21 DIAGNOSIS — Z880 Allergy status to penicillin: Secondary | ICD-10-CM

## 2019-02-21 DIAGNOSIS — J9600 Acute respiratory failure, unspecified whether with hypoxia or hypercapnia: Secondary | ICD-10-CM

## 2019-02-21 DIAGNOSIS — Z9119 Patient's noncompliance with other medical treatment and regimen: Secondary | ICD-10-CM

## 2019-02-21 DIAGNOSIS — Z85828 Personal history of other malignant neoplasm of skin: Secondary | ICD-10-CM

## 2019-02-21 DIAGNOSIS — Z7982 Long term (current) use of aspirin: Secondary | ICD-10-CM

## 2019-02-21 DIAGNOSIS — F17290 Nicotine dependence, other tobacco product, uncomplicated: Secondary | ICD-10-CM | POA: Diagnosis present

## 2019-02-21 DIAGNOSIS — Z881 Allergy status to other antibiotic agents status: Secondary | ICD-10-CM

## 2019-02-21 HISTORY — DX: Cardiac arrhythmia, unspecified: I49.9

## 2019-02-21 HISTORY — DX: Pneumonia, unspecified organism: J18.9

## 2019-02-21 LAB — BASIC METABOLIC PANEL
Anion gap: 12 (ref 5–15)
BUN: 21 mg/dL (ref 8–23)
CO2: 20 mmol/L — ABNORMAL LOW (ref 22–32)
Calcium: 9.3 mg/dL (ref 8.9–10.3)
Chloride: 107 mmol/L (ref 98–111)
Creatinine, Ser: 1.36 mg/dL — ABNORMAL HIGH (ref 0.44–1.00)
GFR calc Af Amer: 44 mL/min — ABNORMAL LOW (ref >60)
GFR calc non Af Amer: 38 mL/min — ABNORMAL LOW (ref >60)
Glucose, Bld: 93 mg/dL (ref 70–99)
Potassium: 4 mmol/L (ref 3.5–5.1)
Sodium: 139 mmol/L (ref 135–145)

## 2019-02-21 LAB — CBC WITH DIFFERENTIAL/PLATELET
Abs Immature Granulocytes: 0.02 10*3/uL (ref 0.00–0.07)
Basophils Absolute: 0 10*3/uL (ref 0.0–0.1)
Basophils Relative: 0 %
Eosinophils Absolute: 0.1 10*3/uL (ref 0.0–0.5)
Eosinophils Relative: 1 %
HCT: 43.2 % (ref 36.0–46.0)
Hemoglobin: 12.9 g/dL (ref 12.0–15.0)
Immature Granulocytes: 0 %
Lymphocytes Relative: 7 %
Lymphs Abs: 0.5 10*3/uL — ABNORMAL LOW (ref 0.7–4.0)
MCH: 25.9 pg — ABNORMAL LOW (ref 26.0–34.0)
MCHC: 29.9 g/dL — ABNORMAL LOW (ref 30.0–36.0)
MCV: 86.7 fL (ref 80.0–100.0)
Monocytes Absolute: 0 10*3/uL — ABNORMAL LOW (ref 0.1–1.0)
Monocytes Relative: 1 %
Neutro Abs: 6.2 10*3/uL (ref 1.7–7.7)
Neutrophils Relative %: 91 %
Platelets: 227 10*3/uL (ref 150–400)
RBC: 4.98 MIL/uL (ref 3.87–5.11)
RDW: 16.6 % — ABNORMAL HIGH (ref 11.5–15.5)
Smear Review: ADEQUATE
WBC: 6.8 10*3/uL (ref 4.0–10.5)
nRBC: 0 % (ref 0.0–0.2)

## 2019-02-21 LAB — URINALYSIS, ROUTINE W REFLEX MICROSCOPIC
Bilirubin Urine: NEGATIVE
Glucose, UA: NEGATIVE mg/dL
Ketones, ur: NEGATIVE mg/dL
Nitrite: POSITIVE — AB
Protein, ur: 30 mg/dL — AB
Specific Gravity, Urine: 1.025 (ref 1.005–1.030)
WBC, UA: >50 WBC/hpf — ABNORMAL HIGH (ref 0–5)
pH: 5 (ref 5.0–8.0)

## 2019-02-21 LAB — INFLUENZA PANEL BY PCR (TYPE A & B)
Influenza A By PCR: NEGATIVE
Influenza B By PCR: NEGATIVE

## 2019-02-21 LAB — LACTIC ACID, PLASMA
Lactic Acid, Venous: 3.9 mmol/L (ref 0.5–1.9)
Lactic Acid, Venous: 3.6 mmol/L (ref 0.5–1.9)

## 2019-02-21 MED ORDER — CARVEDILOL 3.125 MG PO TABS
3.1250 mg | ORAL_TABLET | Freq: Two times a day (BID) | ORAL | Status: DC
  Filled 2019-02-21: qty 1

## 2019-02-21 MED ORDER — LORAZEPAM 2 MG/ML IJ SOLN: 0.5000 mg | Freq: Once | INTRAMUSCULAR | Status: DC

## 2019-02-21 MED ORDER — ALBUTEROL SULFATE HFA 108 (90 BASE) MCG/ACT IN AERS
7.0000 | INHALATION_SPRAY | Freq: Once | RESPIRATORY_TRACT | Status: AC
  Administered 2019-02-21: 7 via RESPIRATORY_TRACT
  Filled 2019-02-21: qty 6.7

## 2019-02-21 MED ORDER — SODIUM CHLORIDE 0.9 % IV BOLUS
1000.0000 mL | Freq: Once | INTRAVENOUS | Status: AC
  Administered 2019-02-21: 1000 mL via INTRAVENOUS

## 2019-02-21 MED ORDER — ROSUVASTATIN CALCIUM 20 MG PO TABS
20.0000 mg | ORAL_TABLET | Freq: Every day | ORAL | Status: DC
  Administered 2019-02-22 – 2019-02-25 (×4): 20 mg via ORAL
  Filled 2019-02-21 (×4): qty 1

## 2019-02-21 MED ORDER — ALBUTEROL SULFATE HFA 108 (90 BASE) MCG/ACT IN AERS
1.0000 | INHALATION_SPRAY | RESPIRATORY_TRACT | Status: DC | PRN
  Filled 2019-02-21: qty 6.7

## 2019-02-21 MED ORDER — ROPINIROLE HCL 1 MG PO TABS
3.0000 mg | ORAL_TABLET | Freq: Every day | ORAL | Status: DC
  Administered 2019-02-22 – 2019-03-09 (×16): 3 mg via ORAL
  Filled 2019-02-21: qty 6
  Filled 2019-02-21: qty 3
  Filled 2019-02-21 (×2): qty 6
  Filled 2019-02-21 (×8): qty 3
  Filled 2019-02-21: qty 6
  Filled 2019-02-21 (×6): qty 3

## 2019-02-21 MED ORDER — ASPIRIN 325 MG PO TABS
325.0000 mg | ORAL_TABLET | Freq: Every day | ORAL | Status: DC
  Administered 2019-02-22 – 2019-02-25 (×4): 325 mg via ORAL
  Filled 2019-02-21 (×4): qty 1

## 2019-02-21 MED ORDER — SODIUM CHLORIDE 0.9 % IV SOLN
1.0000 g | INTRAVENOUS | Status: DC
  Administered 2019-02-21: 21:00:00 1 g via INTRAVENOUS
  Filled 2019-02-21: qty 10

## 2019-02-21 MED ORDER — SODIUM CHLORIDE 0.9 % IV SOLN
INTRAVENOUS | Status: DC
  Administered 2019-02-21: 19:00:00 via INTRAVENOUS

## 2019-02-21 MED ORDER — AMLODIPINE BESYLATE 5 MG PO TABS: 5.0000 mg | ORAL_TABLET | Freq: Every day | ORAL | Status: DC

## 2019-02-21 MED ORDER — LORAZEPAM 2 MG/ML IJ SOLN
0.5000 mg | Freq: Once | INTRAMUSCULAR | Status: AC
  Administered 2019-02-21: 0.5 mg via INTRAVENOUS
  Filled 2019-02-21: qty 1

## 2019-02-21 MED ORDER — CITALOPRAM HYDROBROMIDE 20 MG PO TABS: 40.0000 mg | ORAL_TABLET | Freq: Every day | ORAL | Status: DC

## 2019-02-21 MED ORDER — HEPARIN SODIUM (PORCINE) 5000 UNIT/ML IJ SOLN
5000.0000 [IU] | Freq: Three times a day (TID) | INTRAMUSCULAR | Status: DC
  Administered 2019-02-22 (×3): 5000 [IU] via SUBCUTANEOUS
  Filled 2019-02-21 (×4): qty 1

## 2019-02-21 MED ORDER — ACETAMINOPHEN 325 MG PO TABS
650.0000 mg | ORAL_TABLET | Freq: Four times a day (QID) | ORAL | Status: DC | PRN
  Administered 2019-02-22 – 2019-03-10 (×20): 650 mg via ORAL
  Filled 2019-02-21 (×22): qty 2

## 2019-02-21 MED ORDER — ACETAMINOPHEN 500 MG PO TABS
1000.0000 mg | ORAL_TABLET | Freq: Once | ORAL | Status: AC
  Administered 2019-02-21: 1000 mg via ORAL
  Filled 2019-02-21: qty 2

## 2019-02-21 MED ORDER — SODIUM CHLORIDE 0.9 % IV BOLUS: 1000.0000 mL | Freq: Once | INTRAVENOUS | Status: DC

## 2019-02-21 MED ORDER — AZITHROMYCIN 500 MG PO TABS: 500.0000 mg | ORAL_TABLET | ORAL | Status: DC

## 2019-02-21 MED ORDER — LEVOFLOXACIN IN D5W 750 MG/150ML IV SOLN
750.0000 mg | Freq: Once | INTRAVENOUS | Status: AC
  Administered 2019-02-21: 15:00:00 750 mg via INTRAVENOUS
  Filled 2019-02-21: qty 150

## 2019-02-21 MED ORDER — PANTOPRAZOLE SODIUM 40 MG PO TBEC: 80.0000 mg | DELAYED_RELEASE_TABLET | Freq: Every day | ORAL | Status: DC
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
## 2019-02-21 NOTE — ED Notes (Signed)
Patient given bag meal with drink. 
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
## 2019-02-21 NOTE — ED Triage Notes (Signed)
Pt c/o sob an tremors x 1 hour, pt aaox4 able to speak  In complete sentences , pt does smoke
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
## 2019-02-21 NOTE — ED Notes (Signed)
Paged Dr. Chrissie Noa re: BP.  MD will look at chart and call back.

## 2019-02-21 NOTE — ED Notes (Signed)
Report attempted 

## 2019-02-22 ENCOUNTER — Inpatient Hospital Stay (HOSPITAL_COMMUNITY): Payer: Medicare Other

## 2019-02-22 ENCOUNTER — Inpatient Hospital Stay: Payer: Self-pay

## 2019-02-22 DIAGNOSIS — N189 Chronic kidney disease, unspecified: Secondary | ICD-10-CM | POA: Diagnosis not present

## 2019-02-22 DIAGNOSIS — I480 Paroxysmal atrial fibrillation: Secondary | ICD-10-CM | POA: Diagnosis not present

## 2019-02-22 DIAGNOSIS — E785 Hyperlipidemia, unspecified: Secondary | ICD-10-CM | POA: Diagnosis present

## 2019-02-22 DIAGNOSIS — N136 Pyonephrosis: Secondary | ICD-10-CM | POA: Diagnosis present

## 2019-02-22 DIAGNOSIS — Z452 Encounter for adjustment and management of vascular access device: Secondary | ICD-10-CM | POA: Diagnosis not present

## 2019-02-22 DIAGNOSIS — I10 Essential (primary) hypertension: Secondary | ICD-10-CM | POA: Diagnosis not present

## 2019-02-22 DIAGNOSIS — J189 Pneumonia, unspecified organism: Secondary | ICD-10-CM | POA: Diagnosis not present

## 2019-02-22 DIAGNOSIS — D6489 Other specified anemias: Secondary | ICD-10-CM | POA: Diagnosis present

## 2019-02-22 DIAGNOSIS — I2510 Atherosclerotic heart disease of native coronary artery without angina pectoris: Secondary | ICD-10-CM | POA: Diagnosis not present

## 2019-02-22 DIAGNOSIS — J181 Lobar pneumonia, unspecified organism: Secondary | ICD-10-CM

## 2019-02-22 DIAGNOSIS — R918 Other nonspecific abnormal finding of lung field: Secondary | ICD-10-CM | POA: Diagnosis not present

## 2019-02-22 DIAGNOSIS — Z978 Presence of other specified devices: Secondary | ICD-10-CM | POA: Diagnosis not present

## 2019-02-22 DIAGNOSIS — N1330 Unspecified hydronephrosis: Secondary | ICD-10-CM | POA: Diagnosis not present

## 2019-02-22 DIAGNOSIS — N179 Acute kidney failure, unspecified: Secondary | ICD-10-CM | POA: Diagnosis not present

## 2019-02-22 DIAGNOSIS — Z931 Gastrostomy status: Secondary | ICD-10-CM | POA: Diagnosis not present

## 2019-02-22 DIAGNOSIS — R0602 Shortness of breath: Secondary | ICD-10-CM | POA: Diagnosis not present

## 2019-02-22 DIAGNOSIS — N3000 Acute cystitis without hematuria: Secondary | ICD-10-CM | POA: Diagnosis not present

## 2019-02-22 DIAGNOSIS — I340 Nonrheumatic mitral (valve) insufficiency: Secondary | ICD-10-CM | POA: Diagnosis not present

## 2019-02-22 DIAGNOSIS — Z6841 Body Mass Index (BMI) 40.0 and over, adult: Secondary | ICD-10-CM | POA: Diagnosis not present

## 2019-02-22 DIAGNOSIS — R0902 Hypoxemia: Secondary | ICD-10-CM | POA: Diagnosis not present

## 2019-02-22 DIAGNOSIS — J9690 Respiratory failure, unspecified, unspecified whether with hypoxia or hypercapnia: Secondary | ICD-10-CM | POA: Diagnosis not present

## 2019-02-22 DIAGNOSIS — F419 Anxiety disorder, unspecified: Secondary | ICD-10-CM | POA: Diagnosis present

## 2019-02-22 DIAGNOSIS — R6521 Severe sepsis with septic shock: Secondary | ICD-10-CM | POA: Diagnosis not present

## 2019-02-22 DIAGNOSIS — N390 Urinary tract infection, site not specified: Secondary | ICD-10-CM | POA: Diagnosis not present

## 2019-02-22 DIAGNOSIS — R6520 Severe sepsis without septic shock: Secondary | ICD-10-CM | POA: Diagnosis not present

## 2019-02-22 DIAGNOSIS — M4690 Unspecified inflammatory spondylopathy, site unspecified: Secondary | ICD-10-CM | POA: Diagnosis present

## 2019-02-22 DIAGNOSIS — Z20828 Contact with and (suspected) exposure to other viral communicable diseases: Secondary | ICD-10-CM | POA: Diagnosis present

## 2019-02-22 DIAGNOSIS — A419 Sepsis, unspecified organism: Secondary | ICD-10-CM | POA: Diagnosis not present

## 2019-02-22 DIAGNOSIS — G9340 Encephalopathy, unspecified: Secondary | ICD-10-CM | POA: Diagnosis not present

## 2019-02-22 DIAGNOSIS — J449 Chronic obstructive pulmonary disease, unspecified: Secondary | ICD-10-CM | POA: Diagnosis not present

## 2019-02-22 DIAGNOSIS — G931 Anoxic brain damage, not elsewhere classified: Secondary | ICD-10-CM | POA: Diagnosis present

## 2019-02-22 DIAGNOSIS — J9602 Acute respiratory failure with hypercapnia: Secondary | ICD-10-CM | POA: Diagnosis not present

## 2019-02-22 DIAGNOSIS — N201 Calculus of ureter: Secondary | ICD-10-CM | POA: Diagnosis not present

## 2019-02-22 DIAGNOSIS — E870 Hyperosmolality and hypernatremia: Secondary | ICD-10-CM | POA: Diagnosis not present

## 2019-02-22 DIAGNOSIS — E860 Dehydration: Secondary | ICD-10-CM | POA: Diagnosis present

## 2019-02-22 DIAGNOSIS — J9601 Acute respiratory failure with hypoxia: Secondary | ICD-10-CM

## 2019-02-22 DIAGNOSIS — F17290 Nicotine dependence, other tobacco product, uncomplicated: Secondary | ICD-10-CM | POA: Diagnosis present

## 2019-02-22 DIAGNOSIS — G4733 Obstructive sleep apnea (adult) (pediatric): Secondary | ICD-10-CM | POA: Diagnosis present

## 2019-02-22 DIAGNOSIS — D72829 Elevated white blood cell count, unspecified: Secondary | ICD-10-CM | POA: Diagnosis not present

## 2019-02-22 DIAGNOSIS — K219 Gastro-esophageal reflux disease without esophagitis: Secondary | ICD-10-CM | POA: Diagnosis present

## 2019-02-22 DIAGNOSIS — I5031 Acute diastolic (congestive) heart failure: Secondary | ICD-10-CM | POA: Diagnosis not present

## 2019-02-22 DIAGNOSIS — A4151 Sepsis due to Escherichia coli [E. coli]: Secondary | ICD-10-CM | POA: Diagnosis not present

## 2019-02-22 DIAGNOSIS — J9621 Acute and chronic respiratory failure with hypoxia: Secondary | ICD-10-CM | POA: Diagnosis not present

## 2019-02-22 DIAGNOSIS — I361 Nonrheumatic tricuspid (valve) insufficiency: Secondary | ICD-10-CM | POA: Diagnosis not present

## 2019-02-22 DIAGNOSIS — J440 Chronic obstructive pulmonary disease with acute lower respiratory infection: Secondary | ICD-10-CM | POA: Diagnosis present

## 2019-02-22 DIAGNOSIS — I110 Hypertensive heart disease with heart failure: Secondary | ICD-10-CM | POA: Diagnosis present

## 2019-02-22 DIAGNOSIS — Z955 Presence of coronary angioplasty implant and graft: Secondary | ICD-10-CM | POA: Diagnosis not present

## 2019-02-22 HISTORY — DX: Acute respiratory failure with hypoxia: J96.01

## 2019-02-22 LAB — BLOOD CULTURE ID PANEL (REFLEXED)

## 2019-02-22 LAB — BLOOD GAS, ARTERIAL
Acid-base deficit: 6.5 mmol/L — ABNORMAL HIGH (ref 0.0–2.0)
Acid-base deficit: 7.8 mmol/L — ABNORMAL HIGH (ref 0.0–2.0)
Acid-base deficit: 8.8 mmol/L — ABNORMAL HIGH (ref 0.0–2.0)
Bicarbonate: 18 mmol/L — ABNORMAL LOW (ref 20.0–28.0)
Bicarbonate: 18.6 mmol/L — ABNORMAL LOW (ref 20.0–28.0)
Bicarbonate: 16 mmol/L — ABNORMAL LOW (ref 20.0–28.0)
Drawn by: 364961
Drawn by: 31101
Drawn by: 31101
FIO2: 50
FIO2: 50
MECHVT: 400 mL
MECHVT: 400 mL
O2 Content: 11 L/min
O2 Saturation: 97.1 %
O2 Saturation: 95.4 %
O2 Saturation: 98.6 %
PEEP: 10 cmH2O
PEEP: 10 cmH2O
Patient temperature: 98.6
Patient temperature: 98.6
Patient temperature: 98.6
RATE: 20 resp/min
RATE: 26 resp/min
pCO2 arterial: 33.5 mmHg (ref 32.0–48.0)
pCO2 arterial: 47 mmHg (ref 32.0–48.0)
pCO2 arterial: 31.5 mmHg — ABNORMAL LOW (ref 32.0–48.0)
pH, Arterial: 7.35 (ref 7.350–7.450)
pH, Arterial: 7.221 — ABNORMAL LOW (ref 7.350–7.450)
pH, Arterial: 7.327 — ABNORMAL LOW (ref 7.350–7.450)
pO2, Arterial: 110 mmHg — ABNORMAL HIGH (ref 83.0–108.0)
pO2, Arterial: 87.8 mmHg (ref 83.0–108.0)
pO2, Arterial: 153 mmHg — ABNORMAL HIGH (ref 83.0–108.0)

## 2019-02-22 LAB — POCT I-STAT 7, (LYTES, BLD GAS, ICA,H+H)
Acid-base deficit: 9 mmol/L — ABNORMAL HIGH (ref 0.0–2.0)
Acid-base deficit: 8 mmol/L — ABNORMAL HIGH (ref 0.0–2.0)
Bicarbonate: 23.4 mmol/L (ref 20.0–28.0)
Bicarbonate: 19.2 mmol/L — ABNORMAL LOW (ref 20.0–28.0)
Calcium, Ion: 1.25 mmol/L (ref 1.15–1.40)
Calcium, Ion: 1.15 mmol/L (ref 1.15–1.40)
HCT: 35 % — ABNORMAL LOW (ref 36.0–46.0)
HCT: 32 % — ABNORMAL LOW (ref 36.0–46.0)
Hemoglobin: 11.9 g/dL — ABNORMAL LOW (ref 12.0–15.0)
Hemoglobin: 10.9 g/dL — ABNORMAL LOW (ref 12.0–15.0)
O2 Saturation: 99 %
O2 Saturation: 96 %
Patient temperature: 98.8
Patient temperature: 98.6
Potassium: 4.5 mmol/L (ref 3.5–5.1)
Potassium: 4.6 mmol/L (ref 3.5–5.1)
Sodium: 139 mmol/L (ref 135–145)
Sodium: 138 mmol/L (ref 135–145)
TCO2: 26 mmol/L (ref 22–32)
TCO2: 20 mmol/L — ABNORMAL LOW (ref 22–32)
pCO2 arterial: 96 mmHg (ref 32.0–48.0)
pCO2 arterial: 43.4 mmHg (ref 32.0–48.0)
pH, Arterial: 6.996 — CL (ref 7.350–7.450)
pH, Arterial: 7.254 — ABNORMAL LOW (ref 7.350–7.450)
pO2, Arterial: 203 mmHg — ABNORMAL HIGH (ref 83.0–108.0)
pO2, Arterial: 99 mmHg (ref 83.0–108.0)

## 2019-02-22 LAB — GLUCOSE, CAPILLARY
Glucose-Capillary: 59 mg/dL — ABNORMAL LOW (ref 70–99)
Glucose-Capillary: 89 mg/dL (ref 70–99)
Glucose-Capillary: 108 mg/dL — ABNORMAL HIGH (ref 70–99)

## 2019-02-22 LAB — FERRITIN: Ferritin: 45 ng/mL (ref 11–307)

## 2019-02-22 LAB — CBC WITH DIFFERENTIAL/PLATELET
Abs Immature Granulocytes: 2.9 10*3/uL — ABNORMAL HIGH (ref 0.00–0.07)
Basophils Absolute: 0 10*3/uL (ref 0.0–0.1)
Basophils Relative: 0 %
Eosinophils Absolute: 0 10*3/uL (ref 0.0–0.5)
Eosinophils Relative: 0 %
HCT: 33.9 % — ABNORMAL LOW (ref 36.0–46.0)
Hemoglobin: 10.5 g/dL — ABNORMAL LOW (ref 12.0–15.0)
Lymphocytes Relative: 4 %
Lymphs Abs: 1.9 10*3/uL (ref 0.7–4.0)
MCH: 27.6 pg (ref 26.0–34.0)
MCHC: 31 g/dL (ref 30.0–36.0)
MCV: 89 fL (ref 80.0–100.0)
Metamyelocytes Relative: 6 %
Monocytes Absolute: 0 10*3/uL — ABNORMAL LOW (ref 0.1–1.0)
Monocytes Relative: 0 %
Neutro Abs: 43.8 10*3/uL — ABNORMAL HIGH (ref 1.7–7.7)
Neutrophils Relative %: 90 %
Platelets: UNDETERMINED 10*3/uL (ref 150–400)
RBC: 3.81 MIL/uL — ABNORMAL LOW (ref 3.87–5.11)
RDW: 17.3 % — ABNORMAL HIGH (ref 11.5–15.5)
WBC: 48.7 10*3/uL — ABNORMAL HIGH (ref 4.0–10.5)
nRBC: 0 % (ref 0.0–0.2)
nRBC: 0 /100 WBC

## 2019-02-22 LAB — MAGNESIUM
Magnesium: 1.4 mg/dL — ABNORMAL LOW (ref 1.7–2.4)
Magnesium: 1.3 mg/dL — ABNORMAL LOW (ref 1.7–2.4)

## 2019-02-22 LAB — COMPREHENSIVE METABOLIC PANEL
ALT: 37 U/L (ref 0–44)
AST: 90 U/L — ABNORMAL HIGH (ref 15–41)
Albumin: 2.4 g/dL — ABNORMAL LOW (ref 3.5–5.0)
Alkaline Phosphatase: 91 U/L (ref 38–126)
Anion gap: 8 (ref 5–15)
BUN: 28 mg/dL — ABNORMAL HIGH (ref 8–23)
CO2: 21 mmol/L — ABNORMAL LOW (ref 22–32)
Calcium: 7.4 mg/dL — ABNORMAL LOW (ref 8.9–10.3)
Chloride: 109 mmol/L (ref 98–111)
Creatinine, Ser: 1.71 mg/dL — ABNORMAL HIGH (ref 0.44–1.00)
GFR calc Af Amer: 33 mL/min — ABNORMAL LOW (ref >60)
GFR calc non Af Amer: 29 mL/min — ABNORMAL LOW (ref >60)
Glucose, Bld: 106 mg/dL — ABNORMAL HIGH (ref 70–99)
Potassium: 4.8 mmol/L (ref 3.5–5.1)
Sodium: 138 mmol/L (ref 135–145)
Total Bilirubin: 0.9 mg/dL (ref 0.3–1.2)
Total Protein: 5 g/dL — ABNORMAL LOW (ref 6.5–8.1)

## 2019-02-22 LAB — C-REACTIVE PROTEIN: CRP: 15.8 mg/dL — ABNORMAL HIGH (ref <1.0)

## 2019-02-22 LAB — HIV ANTIBODY (ROUTINE TESTING W REFLEX): HIV Screen 4th Generation wRfx: NONREACTIVE

## 2019-02-22 LAB — PHOSPHORUS
Phosphorus: 4.7 mg/dL — ABNORMAL HIGH (ref 2.5–4.6)
Phosphorus: 4.2 mg/dL (ref 2.5–4.6)

## 2019-02-22 LAB — PROTIME-INR
INR: 1.4 — ABNORMAL HIGH (ref 0.8–1.2)
Prothrombin Time: 17.3 seconds — ABNORMAL HIGH (ref 11.4–15.2)

## 2019-02-22 LAB — D-DIMER, QUANTITATIVE: D-Dimer, Quant: 4.88 ug/mL-FEU — ABNORMAL HIGH (ref 0.00–0.50)

## 2019-02-22 LAB — STREP PNEUMONIAE URINARY ANTIGEN: Strep Pneumo Urinary Antigen: NEGATIVE

## 2019-02-22 MED ORDER — FENTANYL BOLUS VIA INFUSION
50.0000 ug | INTRAVENOUS | Status: DC | PRN
  Administered 2019-02-22 – 2019-02-27 (×16): 50 ug via INTRAVENOUS
  Filled 2019-02-22: qty 50

## 2019-02-22 MED ORDER — SODIUM CHLORIDE 0.9 % IV SOLN: 250.0000 mL | INTRAVENOUS | Status: DC

## 2019-02-22 MED ORDER — VASOPRESSIN 20 UNIT/ML IV SOLN
0.0300 [IU]/min | INTRAVENOUS | Status: DC
  Administered 2019-02-22 – 2019-02-23 (×2): 0.03 [IU]/min via INTRAVENOUS
  Filled 2019-02-22 (×2): qty 2

## 2019-02-22 MED ORDER — PHENYLEPHRINE HCL-NACL 10-0.9 MG/250ML-% IV SOLN
INTRAVENOUS | Status: AC
  Filled 2019-02-22: qty 250

## 2019-02-22 MED ORDER — PHENYLEPHRINE HCL-NACL 10-0.9 MG/250ML-% IV SOLN
0.0000 ug/min | INTRAVENOUS | Status: DC
  Administered 2019-02-22 (×2): 300 ug/min via INTRAVENOUS
  Administered 2019-02-22: 04:00:00 100 ug/min via INTRAVENOUS
  Administered 2019-02-22: 23:00:00 50 ug/min via INTRAVENOUS
  Administered 2019-02-22: 07:00:00 300 ug/min via INTRAVENOUS
  Administered 2019-02-22: 20 ug/min via INTRAVENOUS
  Filled 2019-02-22 (×5): qty 250
  Filled 2019-02-22: qty 750

## 2019-02-22 MED ORDER — MIDAZOLAM HCL 2 MG/2ML IJ SOLN
4.0000 mg | Freq: Once | INTRAMUSCULAR | Status: AC
  Administered 2019-02-22: 4 mg via INTRAVENOUS

## 2019-02-22 MED ORDER — FENTANYL CITRATE (PF) 100 MCG/2ML IJ SOLN
100.0000 ug | Freq: Once | INTRAMUSCULAR | Status: AC
  Administered 2019-02-22: 100 ug via INTRAVENOUS

## 2019-02-22 MED ORDER — SODIUM CHLORIDE 0.9 % IV SOLN: 500.0000 mg | INTRAVENOUS | Status: DC

## 2019-02-22 MED ORDER — ORAL CARE MOUTH RINSE
15.0000 mL | OROMUCOSAL | Status: DC
  Administered 2019-02-22 – 2019-03-03 (×86): 15 mL via OROMUCOSAL

## 2019-02-22 MED ORDER — HYDROXYCHLOROQUINE SULFATE 200 MG PO TABS
400.0000 mg | ORAL_TABLET | Freq: Two times a day (BID) | ORAL | Status: AC
  Filled 2019-02-22 (×2): qty 2

## 2019-02-22 MED ORDER — SODIUM CHLORIDE 0.9 % IV SOLN
2.0000 g | INTRAVENOUS | Status: AC
  Administered 2019-02-22 – 2019-02-27 (×6): 2 g via INTRAVENOUS
  Filled 2019-02-22 (×6): qty 20

## 2019-02-22 MED ORDER — MIDAZOLAM HCL 2 MG/2ML IJ SOLN
1.0000 mg | INTRAMUSCULAR | Status: DC | PRN
  Administered 2019-02-24 (×2): 1 mg via INTRAVENOUS
  Filled 2019-02-22 (×2): qty 2

## 2019-02-22 MED ORDER — FENTANYL 2500MCG IN NS 250ML (10MCG/ML) PREMIX INFUSION
0.0000 ug/h | INTRAVENOUS | Status: DC
  Administered 2019-02-22: 03:00:00 25 ug/h via INTRAVENOUS
  Administered 2019-02-22: 17:00:00 200 ug/h via INTRAVENOUS
  Administered 2019-02-23: 300 ug/h via INTRAVENOUS
  Administered 2019-02-23: 400 ug/h via INTRAVENOUS
  Administered 2019-02-24: 350 ug/h via INTRAVENOUS
  Administered 2019-02-25: 300 ug/h via INTRAVENOUS
  Administered 2019-02-25: 100 ug/h via INTRAVENOUS
  Administered 2019-02-26: 150 ug/h via INTRAVENOUS
  Administered 2019-02-26 – 2019-02-28 (×4): 200 ug/h via INTRAVENOUS
  Filled 2019-02-22 (×12): qty 250

## 2019-02-22 MED ORDER — CHLORHEXIDINE GLUCONATE 0.12% ORAL RINSE (MEDLINE KIT)
15.0000 mL | Freq: Two times a day (BID) | OROMUCOSAL | Status: DC
  Administered 2019-02-22 – 2019-03-03 (×18): 15 mL via OROMUCOSAL

## 2019-02-22 MED ORDER — ETOMIDATE 2 MG/ML IV SOLN
30.0000 mg | Freq: Once | INTRAVENOUS | Status: AC
  Administered 2019-02-22: 30 mg via INTRAVENOUS

## 2019-02-22 MED ORDER — VECURONIUM BROMIDE 10 MG IV SOLR
10.0000 mg | Freq: Once | INTRAVENOUS | Status: AC
  Administered 2019-02-22: 10 mg via INTRAVENOUS
  Filled 2019-02-22: qty 10

## 2019-02-22 MED ORDER — MIDAZOLAM HCL 2 MG/2ML IJ SOLN
1.0000 mg | INTRAMUSCULAR | Status: DC | PRN
  Administered 2019-02-22 – 2019-02-24 (×4): 1 mg via INTRAVENOUS
  Filled 2019-02-22 (×3): qty 2

## 2019-02-22 MED ORDER — SODIUM CHLORIDE 0.9 % IV BOLUS
1000.0000 mL | Freq: Once | INTRAVENOUS | Status: AC
  Administered 2019-02-22: 20:00:00 1000 mL via INTRAVENOUS

## 2019-02-22 MED ORDER — MAGNESIUM SULFATE 4 GM/100ML IV SOLN
4.0000 g | Freq: Once | INTRAVENOUS | Status: AC
  Administered 2019-02-22: 17:00:00 4 g via INTRAVENOUS
  Filled 2019-02-22: qty 100

## 2019-02-22 MED ORDER — PHENYLEPHRINE HCL-NACL 10-0.9 MG/250ML-% IV SOLN
INTRAVENOUS | Status: AC
  Administered 2019-02-22: 50 ug/min via INTRAVENOUS
  Filled 2019-02-22: qty 250

## 2019-02-22 MED ORDER — LACTATED RINGERS IV BOLUS
1000.0000 mL | Freq: Once | INTRAVENOUS | Status: AC
  Administered 2019-02-22: 1000 mL via INTRAVENOUS

## 2019-02-22 MED ORDER — DEXTROSE IN LACTATED RINGERS 5 % IV SOLN
INTRAVENOUS | Status: DC
  Administered 2019-02-22 – 2019-02-23 (×2): via INTRAVENOUS

## 2019-02-22 MED ORDER — HYDROXYCHLOROQUINE SULFATE 200 MG PO TABS
200.0000 mg | ORAL_TABLET | Freq: Two times a day (BID) | ORAL | Status: DC
  Administered 2019-02-22: 200 mg via ORAL
  Filled 2019-02-22 (×2): qty 1

## 2019-02-22 MED ORDER — SODIUM CHLORIDE 0.9% FLUSH: 10.0000 mL | INTRAVENOUS | Status: DC | PRN

## 2019-02-22 MED ORDER — MIDAZOLAM 50MG/50ML (1MG/ML) PREMIX INFUSION
0.0000 mg/h | INTRAVENOUS | Status: DC
  Administered 2019-02-22 – 2019-02-23 (×3): 10 mg/h via INTRAVENOUS
  Filled 2019-02-22 (×4): qty 50

## 2019-02-22 MED ORDER — PANTOPRAZOLE SODIUM 40 MG IV SOLR: 40.0000 mg | Freq: Once | INTRAVENOUS | Status: DC

## 2019-02-22 MED ORDER — PRO-STAT SUGAR FREE PO LIQD
30.0000 mL | Freq: Every day | ORAL | Status: DC
  Administered 2019-02-22 – 2019-03-01 (×8): 30 mL
  Filled 2019-02-22 (×8): qty 30

## 2019-02-22 MED ORDER — VITAL HIGH PROTEIN PO LIQD
1000.0000 mL | ORAL | Status: AC
  Administered 2019-02-22 – 2019-03-01 (×7): 1000 mL
  Filled 2019-02-22: qty 1000

## 2019-02-22 MED ORDER — DEXTROSE 50 % IV SOLN
INTRAVENOUS | Status: AC
  Administered 2019-02-22: 50 mL
  Filled 2019-02-22: qty 50

## 2019-02-22 MED ORDER — NOREPINEPHRINE 4 MG/250ML-% IV SOLN
INTRAVENOUS | Status: AC
  Filled 2019-02-22: qty 250

## 2019-02-22 MED ORDER — MIDAZOLAM BOLUS VIA INFUSION
1.0000 mg | INTRAVENOUS | Status: DC | PRN
  Filled 2019-02-22: qty 2

## 2019-02-22 MED ORDER — NOREPINEPHRINE 4 MG/250ML-% IV SOLN
0.0000 ug/min | INTRAVENOUS | Status: DC
  Administered 2019-02-22: 8 ug/min via INTRAVENOUS
  Administered 2019-02-22: 5 ug/min via INTRAVENOUS
  Administered 2019-02-22: 20:00:00 27 ug/min via INTRAVENOUS
  Filled 2019-02-22 (×2): qty 250

## 2019-02-22 MED ORDER — NOREPINEPHRINE 16 MG/250ML-% IV SOLN
0.0000 ug/min | INTRAVENOUS | Status: DC
  Administered 2019-02-22: 30 ug/min via INTRAVENOUS
  Administered 2019-02-23: 16:00:00 7 ug/min via INTRAVENOUS
  Filled 2019-02-22 (×2): qty 250

## 2019-02-22 MED ORDER — CHLORHEXIDINE GLUCONATE CLOTH 2 % EX PADS
6.0000 | MEDICATED_PAD | Freq: Every day | CUTANEOUS | Status: DC
  Administered 2019-02-22 – 2019-03-09 (×16): 6 via TOPICAL

## 2019-02-22 MED ORDER — LACTATED RINGERS IV BOLUS
2000.0000 mL | Freq: Once | INTRAVENOUS | Status: AC
  Administered 2019-02-22: 1000 mL via INTRAVENOUS

## 2019-02-22 MED ORDER — MIDAZOLAM 50MG/50ML (1MG/ML) PREMIX INFUSION
0.5000 mg/h | INTRAVENOUS | Status: DC
  Administered 2019-02-22: 0.5 mg/h via INTRAVENOUS
  Filled 2019-02-22: qty 50

## 2019-02-22 MED ORDER — SODIUM CHLORIDE 0.9% FLUSH
10.0000 mL | Freq: Two times a day (BID) | INTRAVENOUS | Status: DC
  Administered 2019-02-22 – 2019-02-23 (×2): 20 mL
  Administered 2019-02-24 – 2019-03-04 (×13): 10 mL
  Administered 2019-03-05: 22:00:00 30 mL
  Administered 2019-03-05: 10 mL
  Administered 2019-03-06: 40 mL
  Administered 2019-03-06: 30 mL
  Administered 2019-03-07 – 2019-03-08 (×2): 10 mL
  Administered 2019-03-08: 5 mL
  Administered 2019-03-09: 10 mL

## 2019-02-22 MED ORDER — ROCURONIUM BROMIDE 50 MG/5ML IV SOLN
100.0000 mg | Freq: Once | INTRAVENOUS | Status: AC
  Administered 2019-02-22: 100 mg via INTRAVENOUS
  Filled 2019-02-22: qty 10

## 2019-02-22 MED ORDER — ADULT MULTIVITAMIN W/MINERALS CH
1.0000 | ORAL_TABLET | Freq: Every day | ORAL | Status: DC
  Administered 2019-02-22 – 2019-03-04 (×11): 1
  Filled 2019-02-22 (×11): qty 1

## 2019-02-22 MED ORDER — FENTANYL BOLUS VIA INFUSION
25.0000 ug | INTRAVENOUS | Status: DC | PRN
  Filled 2019-02-22: qty 25

## 2019-02-22 MED ORDER — PHENYLEPHRINE HCL-NACL 10-0.9 MG/250ML-% IV SOLN: 25.0000 ug/min | INTRAVENOUS | Status: DC

## 2019-02-22 MED ORDER — SODIUM CHLORIDE 0.9 % IV BOLUS
1000.0000 mL | Freq: Once | INTRAVENOUS | Status: AC
  Administered 2019-02-22: 1000 mL via INTRAVENOUS

## 2019-02-22 MED ORDER — LACTATED RINGERS IV BOLUS
1000.0000 mL | Freq: Once | INTRAVENOUS | Status: DC
  Administered 2019-02-22: 1000 mL via INTRAVENOUS

## 2019-02-22 MED ORDER — FENTANYL CITRATE (PF) 100 MCG/2ML IJ SOLN
50.0000 ug | Freq: Once | INTRAMUSCULAR | Status: AC
  Administered 2019-02-22: 50 ug via INTRAVENOUS

## 2019-02-22 NOTE — Progress Notes (Signed)
PHARMACY - PHYSICIAN COMMUNICATION CRITICAL VALUE ALERT - BLOOD CULTURE IDENTIFICATION (BCID)  Martha Mora is an 73 y.o. female who presented to [facility] on 02/21/2019 with a chief complaint of SOB  Assessment:  4/4 BC with E coli, also r/o [name]  Name of physician (or Provider) Contacted: B [name]  Current antibiotics: Rocephin and azithromycin  Changes to prescribed antibiotics recommended:  Increase rocephin to 2gm IV q24 hours  Results for orders placed or performed during the hospital encounter of 02/21/19  Blood Culture ID Panel (Reflexed) (Collected: 02/21/2019  2:59 PM)  Result Value Ref Range   Enterococcus species NOT DETECTED NOT DETECTED   Listeria monocytogenes NOT DETECTED NOT DETECTED   Staphylococcus species NOT DETECTED NOT DETECTED   Staphylococcus aureus (BCID) NOT DETECTED NOT DETECTED   Streptococcus species NOT DETECTED NOT DETECTED   Streptococcus agalactiae NOT DETECTED NOT DETECTED   Streptococcus pneumoniae NOT DETECTED NOT DETECTED   Streptococcus pyogenes NOT DETECTED NOT DETECTED   Acinetobacter baumannii NOT DETECTED NOT DETECTED   Enterobacteriaceae species DETECTED (A) NOT DETECTED   Enterobacter cloacae complex NOT DETECTED NOT DETECTED   Escherichia coli DETECTED (A) NOT DETECTED   Klebsiella oxytoca NOT DETECTED NOT DETECTED   Klebsiella pneumoniae NOT DETECTED NOT DETECTED   Proteus species NOT DETECTED NOT DETECTED   Serratia marcescens NOT DETECTED NOT DETECTED   Carbapenem resistance NOT DETECTED NOT DETECTED   Haemophilus influenzae NOT DETECTED NOT DETECTED   Neisseria meningitidis NOT DETECTED NOT DETECTED   Pseudomonas aeruginosa NOT DETECTED NOT DETECTED   Candida albicans NOT DETECTED NOT DETECTED   Candida glabrata NOT DETECTED NOT DETECTED   Candida krusei NOT DETECTED NOT DETECTED   Candida parapsilosis NOT DETECTED NOT DETECTED   Candida tropicalis NOT DETECTED NOT DETECTED    Beverlee Nims 02/22/2019  3:39 AM

## 2019-02-22 NOTE — Progress Notes (Signed)
PCCM Interval Progress Note  Called and asked to see pt for vent dysnchrony.  Pt on PRVC 400 / 20 / 88 / 10.   Last ABG from 0900:  7.25 / 43 / 99.  She is completely sedated on 10mg /hr midazolam and 418mcg/hr fentanyl; however, has intermittent vent dysynchrony despite this.  Will give 1 time dose 10mg  vecuronium. ABG when able (RT currently trying to find face shield for PAPR). ELINK to f/u on ABG and assess response after vecuronium.   Martha Mora, [facility] Pager: [phone].  If no answer, [phone] [phone] 02/22/2019, 8:06 PM

## 2019-02-22 NOTE — Procedures (Signed)
Intubation Procedure Note MYRANDA PAVONE [id number] April 22, 1946  Procedure: Intubation Indications: Respiratory insufficiency , r/o COVID , requiring 11 L high flow and tripoding  Procedure Details Consent: Unable to obtain consent because of emergent medical necessity. Time Out: Verified patient identification, verified procedure, site/side was marked, verified correct patient position, special equipment/implants available, medications/allergies/relevent history reviewed, required imaging and test results available.  Performed  Drugs:  100 mcg Fentanyl,4 mg Versed, 30 mg Etomidate, 100 mg Rocuronium. Glid with # 4 blade. Grade 1 view. 7.5 tube visualized passing through vocal cords. Following intubation:, condensation seen in endotracheal tube, equal breath sounds bilaterally.  Evaluation Hemodynamic Status: Transient hypotension treated with pressors and fluid; O2 sats: stable throughout Patient's Current Condition: stable Complications: No apparent complications Patient did tolerate procedure well. Chest X-ray deferred to minimize exposure to medical staff   [place] [facility]  02/22/2019, 2:59 AM

## 2019-02-22 NOTE — Progress Notes (Addendum)
FPTS Interim Progress Note  S: Paged by nurse due to patient experiencing acute worsening of SOB.  Patient had been on 2L per .  Required increase to 10L HFNC.  Patient seen and examined at bedside.  Patient very anxious and moving between sitting on the edge of her bed and standing.  O2 needed to be increased to 11L HFNC.  Patient stated "This all just started happening in the last few hours."    O: BP 119/79 (BP Location: Right Arm)   Pulse 91   Temp 98.7 F (37.1 C) (Oral)   Resp 15   SpO2 95%   Patient very anxious appearing and tachypneic Constantly moving in room, tripoding on bed  A/P: Spoke with CCM doctor, recommended ABG and sending provider to assess patient. Patient increased to 11L HFNC in room, continues to have intermittent desaturations. She did receive Ativan in ED due to SOB which she attributed to anxiety, but at this time will hold off on benzo for ABG, as do not want to decrease patient's respiratory drive.  She is currently a COVID rule out patient and her results are currently pending.  BP has been soft, but now hypertensive with 150s/110s in the room. ICU MD present, recommend patient transfer to ICU. Transfer order placed.  Crocker, DO 02/22/2019, 1:31 AM PGY-1, [facility] pager [phone]

## 2019-02-22 NOTE — Progress Notes (Signed)
Bps soft and map. MD Ramaswamy aware. Ordered PICC line for pressors, sedation and fluid bolus. Cannot give current order for fluid bolus d/t having 1 PIV. Has versed and fentanyl in running IV. Will continue to monitor.

## 2019-02-22 NOTE — Progress Notes (Signed)
Received 3 L bolus of LR

## 2019-02-22 NOTE — Progress Notes (Addendum)
eLink Physician-Brief Progress Note Patient Name: KYMBERLIE BRAZEAU DOB: [birth year] MRN: [id number]   Date of Service  02/22/2019  HPI/Events of Note  73/F with hx of COPD, presenting with dyspnea and foul smelling urine, also being ruled out for COVID.  The patient was on 2L O2 but had increasing requirements overnight.  Pt was transferred to the ICU and subsequently intubated.  Notified by pharmacy that the patient has E. Coli bacteremia.  eICU Interventions  Increase dose of Ceftriaxone.  2g q24hrs. Continue Azithromycin.  Plaquenil started.  Follow EKGs daily. Continue vent support.  Heparin for DVT prophylaxis.  PPI for GI prophylaxis.       Intervention Category Evaluation Type: New Patient Evaluation  Elsie Lincoln 02/22/2019, 3:57 AM   5:52 AM CXR ordered for ETT placement.  Notified of ABG results - 6.99/96/203/23.4  Plan> Increase tidal volume to 400, RR 26.   Check ABG in 1 hour.  Follow up CXR.

## 2019-02-22 NOTE — Progress Notes (Signed)
Pt arrived while RN in another patients room. Pt in bed when this RN entered room. Pt alert and oriented, respirations slightly labored with exertion. O2 @ 2L via nasal cannula. Around 0100 RN paged to room because pt feels short of breath. Pt in obvious respiratory distress, jumping out of bed, very anxious, respiratory rate originally 20-30 but quickly up to 30-40. Oxygen levels noted to be dropping to 88-90% with O2 @ 3L/min via N/C. Oxygen gradually increased as high as 11L via HFNC with continued occasional drops in oxygen levles to 83-88%  Staff called Rapid, Respiratory and MD. ABG drawn by respiratory. Rapid and MD at bedside. Order obtained to transfer pt to 3M11. Pt immediately transferred and report given to ICU and they immediately resumed care. Patients personal belonging bag, Cell phone, cell phone charger cord and eyeglasses were all moved to ICU room-3M11.

## 2019-02-22 NOTE — Progress Notes (Signed)
Family practice teaching service appreciates the excellent care that Martha Mora is receiving.  We are following along with her clinical course and are ready to receive her when deemed appropriate.

## 2019-02-22 NOTE — Progress Notes (Signed)
PCCM    Pt on Levophed @ 30 Will start on Vasopressin shock dose 0.03 u/min MAP goal >24mmHg Discussed with RN  Pt much more synchronous post vecuronium  .Marland KitchenSigned Dr Seward Carol Pulmonary Critical Care Locums

## 2019-02-22 NOTE — Consult Note (Signed)
Name: Martha Mora MRN: [id number] DOB: [birth year] CC: SOB   LOS: 0  [place] / Critical Care Note   bRIEF 73 yo female with hx of COPD, HTN, CAD s/p PCI x1, MVR presenting today with worsening shortness of breath, fevers, chills and cough as well as foul smelling urine. She has been mostly at home and her only contact has been her son. No recent traveling or big group meetings. She continues to smoke E-cigs and has a 80 PPD hx of smoking.  She was admitted to the medical floor on 2 L [place] , CCM called as patient became tachypneic and was requiring escalating amount of oxygen on 11 L NRB. Tx to ICU and intubated.   Lines / Drains: PIV   Cultures: PENDING   Antibiotics: rocephin/azithro   Tests / Events: Intubated at 230 am 4/6   SUBJECTIVE/OVERNIGHT/INTERVAL HX    02/22/2019 - hypotensive despite fluid bolus. On vent. RR 26. Growing GNR iin blood. Has PIV only. Noted tripoding yesterday before admission. Baseline creat 0.8mg % onn levophed 95mcg desp. Currently vent 60% , fio2 peep 10  Covid pending. Had lym[phopenia at admit  Biomarkers - d-dimer 4.88, CRp 15.8, ferrting 45,   QTc 5105msec   Vital Signs: Temp:  [98.7 F (37.1 C)-100.7 F (38.2 C)] 98.8 F (37.1 C) (04/06 0400) Pulse Rate:  [59-113] 63 (04/06 1105) Resp:  [15-27] 26 (04/06 1105) BP: (58-195)/(26-93) 68/43 (04/06 1105) SpO2:  [88 %-100 %] 98 % (04/06 1105) FiO2 (%):  [50 %-100 %] 50 % (04/06 0818) I/O last 3 completed shifts: In: 6334.6 [I.V.:1953.3; IV Piggyback:4381.3] Out: 150 [Urine:150]   Inspection exam from outside or via camera and shared findings with other care providers who have examined in last 30 mintues General Appearance:  Looks criticall ill OBESE - + Head:  Normocephalic, without obvious abnormality, atraumatic Eyes:  Not examined     Ears:  Normal external ear canals, both ears Nose:  G tube  Throat:  ETT TUBE - yes , OG tube - yes Neck:  Supple,  No  enlargement/tenderness/nodules Lungs: Clear to auscultation bilaterally, Ventilator   Synchrony - yues, 60% fio3, PEEP 10, RR 26 Heart:  S1 and S2 normal, no murmur, CVP - no.  Pressors - levophed 59mcg via PIV Abdomen:  Not examined Genitalia / Rectal:  Not done Extremities:  Extremities- intct Skin:  ntact in exposed areas . Sacral area - not xamined Neurologic:  Sedation - fent gtt, versed gtt -> RASS - -3*    Ventilator settings: Vent Mode: PRVC FiO2 (%):  [50 %-100 %] 50 % Set Rate:  [16 bmp-26 bmp] 26 bmp Vt Set:  [260 mL-400 mL] 400 mL PEEP:  [10 XQJ19-41 cmH20] 10 cmH20 Plateau Pressure:  [18 cmH20-21 cmH20] 20 cmH20  LABS    PULMONARY Recent Labs  Lab 02/22/19 0155 02/22/19 0337  PHART 7.350 6.996*  PCO2ART 33.5 96.0*  PO2ART 110* 203.0*  HCO3 18.0* 23.4  TCO2  --  26  O2SAT 97.1 99.0    CBC Recent Labs  Lab 02/21/19 1423 02/22/19 0337 02/22/19 0745  HGB 12.9 11.9* 10.5*  HCT 43.2 35.0* 33.9*  WBC 6.8  --  48.7*  PLT 227  --  PLATELET CLUMPS NOTED ON SMEAR, UNABLE TO ESTIMATE    COAGULATION Recent Labs  Lab 02/22/19 0745  INR 1.4*    CARDIAC  No results for input(s): TROPONINI in the last 168 hours. No results for input(s): PROBNP in the last 168 hours.  CHEMISTRY Recent Labs  Lab 02/21/19 1423 02/22/19 0337 02/22/19 0745  NA 139 139 138  K 4.0 4.5 4.8  CL 107  --  109  CO2 20*  --  21*  GLUCOSE 93  --  106*  BUN 21  --  28*  CREATININE 1.36*  --  1.71*  CALCIUM 9.3  --  7.4*  MG  --   --  1.4*  PHOS  --   --  4.7*   CrCl cannot be calculated (Unknown ideal weight.).   LIVER Recent Labs  Lab 02/22/19 0745  AST 90*  ALT 37  ALKPHOS 91  BILITOT 0.9  PROT 5.0*  ALBUMIN 2.4*  INR 1.4*     INFECTIOUS Recent Labs  Lab 02/21/19 1423 02/21/19 1453  LATICACIDVEN 3.9* 3.6*     ENDOCRINE CBG (last 3)  No results for input(s): GLUCAP in the last 72 hours.       IMAGING x48h  - image(s) personally visualized   -   highlighted in bold Dg Chest Port 1 View  Result Date: 02/22/2019 CLINICAL DATA:  Respiratory failure EXAM: PORTABLE CHEST 1 VIEW COMPARISON:  02/21/2019 FINDINGS: Endotracheal tube placed. Tip is 1.5 cm from the carina. Left basilar consolidation is stable. Right lung is under aerated and clear. No pneumothorax or pleural effusion. IMPRESSION: Stable left basilar consolidation. Endotracheal tube placed. Electronically Signed   By: Marybelle Killings M.D.   On: 02/22/2019 07:56   Dg Chest Portable 1 View  Result Date: 02/21/2019 CLINICAL DATA:  Cough, tremors for 1 hour EXAM: PORTABLE CHEST 1 VIEW COMPARISON:  CT chest 01/05/2019, chest x-ray 12/12/2017 FINDINGS: There is hazy left lower lobe airspace disease concerning for pneumonia. There is no pleural effusion or pneumothorax. The heart and mediastinal contours are unremarkable. The osseous structures are unremarkable. IMPRESSION: Hazy left lower lobe airspace disease concerning for pneumonia. Electronically Signed   By: Kathreen Devoid   On: 02/21/2019 14:30   Dg Abd Portable 1v  Result Date: 02/22/2019 CLINICAL DATA:  Status post OG tube placement. EXAM: PORTABLE ABDOMEN - 1 VIEW COMPARISON:  None. FINDINGS: OG tube is in place and looped in the distal stomach, likely at the pylorus, with the tip projecting slightly retrograde. IMPRESSION: As above. Electronically Signed   By: Inge Rise M.D.   On: 02/22/2019 10:33     Assessment and Plan: Active Problems:   Pneumonia   CAP (community acquired pneumonia)   Acute respiratory failure with hypoxemia ([place]) ASSESSMENT / PLAN:  PULMONARY A:  Baseline copd nos  02/22/2019 -> acute resp failure due to bacterial sepsis but also covid rule out . 60% fio2 and peep 10  P:   PRVC l drop RR 26 to 14 to remove any potential auto peep BD (mdi v neb)  CARDIAC A: At risk nstemi QTC long  P: Check troponin dependng on course consider echo Hold plaquenil  VASCULAR A:   Septic shock  P:   Levophed for map > 65 via PIV PICC line depending on course  RENAL A:  AKI (baseline creat 0.8mg %)  P:  Fluid bolus 3L and reassess   ELECTROLYTES A:  hypomag P: correct   GASTROINTESTINAL A:   NPO  P:   Start TF  HEMATOLOGIC A:  Anemia critical illness   P:  - PRBC for hgb </= 6.9gm%    - exceptions are   -  if ACS susepcted/confirmed then transfuse for hgb </= 8.0gm%,  or    -  active bleeding with hemodynamic instability, then transfuse regardless of hemoglobin value   At at all times try to transfuse 1 unit prbc as possible with exception of active hemorrhage    INFECTIOUS A:   BActerial sepsis Rule out covid   P:      ENDOCRINE A:   At risk hyperglucemia P:   ssi  NEUROLOGIC A:   agitated P:   fent ggtt Versed gtt restraints  GLOBAL 24m13 sick in icu     Best practices / Disposition: -->Code Status: FULL -->DVT Px:HSQ -->GI Px:PPI -->Diet: nutrition c/s for TF - family: none at bedside   ATTESTATION & SIGNATURE   The patient Martha Mora is critically ill with multiple organ systems failure and requires high complexity decision making for assessment and support, frequent evaluation and titration of therapies, application of advanced monitoring technologies and extensive interpretation of multiple databases.   Critical Care Time devoted to patient care services described in this note is  30  Minutes. This time reflects time of care of this signee Dr Brand Males. This critical care time does not reflect procedure time, or teaching time or supervisory time of PA/NP/Med student/Med Resident etc but could involve care discussion time     Dr. Brand Males, M.D., [facility].C.P Pulmonary and Critical Care Medicine Staff Physician [facility] Pager: [phone], If no answer or between  15:00h - 7:00h: call [phone]  02/22/2019 11:39 AM

## 2019-02-22 NOTE — Progress Notes (Signed)
[place] Progress Note Patient Name: Martha Mora DOB: 09-14-1946 MRN: [id number]   Date of Service  02/22/2019  HPI/Events of Note  Multiple issues: 1. Patient dys-synchronous with ventilator and 2. Oliguria - Patient has PICC line. No CVP measured yet. LVEF 55-65% last [place].   eICU Interventions  Will order: 1. ABG STAT. 2. Monitor CVP now and Q 4 hours. 3. Bolus with 0.9 NaCl 1 liter IV over 1 hour now.      Intervention Category Major Interventions: Respiratory failure - evaluation and management Intermediate Interventions: Oliguria - evaluation and management  [name] 02/22/2019, 7:38 PM

## 2019-02-22 NOTE — Progress Notes (Signed)
[place] Progress Note Patient Name: Martha Mora DOB: 04/13/1946 MRN: [id number]   Date of Service  02/22/2019  HPI/Events of Note  ABG on 50%/PRVC 26/TV 400/P 10 = 7.32/31.5/153.0  eICU Interventions  Continue present ventilator management.      Intervention Category Major Interventions: Acid-Base disturbance - evaluation and management;Respiratory failure - evaluation and management  Lysle Dingwall 02/22/2019, 11:55 PM

## 2019-02-22 NOTE — Progress Notes (Signed)
Called to patient room to obtain ABG.  Pt on 11 L nasal cannula and having worsening shortness of breath.  Obtained ABG and handed off to rapid response nurse.  Assisted to transport patient to the unit.  Continuing worsening of shortness of breath. MD at bedside to intubate patient. Pt intubated and placed on vent.  7.5 ETT secured at 23 at lip. RT will continue to monitor.
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
## 2019-02-22 NOTE — Progress Notes (Signed)
Initial Nutrition Assessment   RD working remotely.   DOCUMENTATION CODES:   Morbid obesity  INTERVENTION:   Tube Feeding:  Vital High Protein at 40 ml/hr Pro-Stat 30 mL daily Provides 1060 kcals, 100 g of protein and 806 mL of free water  Add MVI with Minerals  Plan to re-assess nutritional needs once new weight available  NUTRITION DIAGNOSIS:   Inadequate oral intake related to acute illness as evidenced by NPO status.  GOAL:   Patient will meet greater than or equal to 90% of their needs   MONITOR:   Vent status, TF tolerance, Labs, Weight trends  REASON FOR ASSESSMENT:   Consult, Ventilator Enteral/tube feeding initiation and management  ASSESSMENT:   73 yo female admitted on 4/5 with acute respiratory failure due to bacterial sepsis but also COVID-19 rule out. Intubated on 4/6 and transferred to ICU. PMH includes COPD, HTN, CAD s/p PCI   COVID-19 results pending Patient is currently intubated on ventilator support, on levophed and phenylephrine, sedated with fentanyl and versed MV: 8.1 L/min Temp (24hrs), Avg:99 F (37.2 C), Min:98.5 F (36.9 C), Max:100.3 F (37.9 C)  OG tube with tip in stomach  Unable to obtain diet and weight history at this time. No weight in chart from this admission; spoke with RN who is going to attempt and get a new weight (RN thinks bed might not have been zeroed prior to pt transfer). If RN unable to get current wt; plan to utilize weight from 12/28/18 encounter. RN verbalizes that pt does appear to be 4'11" tall.    Labs: Creatinine 1.71, BUN 21, phosphorus 4.7 (H), magnesium 1.4 (L) Meds: D5-LR at 75 ml/hr  Diet Order:   Diet Order    None      EDUCATION NEEDS:   Not appropriate for education at this time  Skin:  Skin Assessment: Reviewed RN Assessment  Last BM:  no documented BM  Height:   Ht Readings from Last 1 Encounters:  02/22/19 4\' 11"  (1.499 m)    Weight:   Wt Readings from Last 1 Encounters:   12/28/18 96.2 kg    Ideal Body Weight:  43 kg  BMI:  Body mass index is 42.82 kg/m.  Estimated Nutritional Needs:   Kcal:  1060-1350 kcals   Protein:  86-108 g  Fluid:  >/= 1.5 L   Kerman Passey MS, RD, LDN, CNSC [phone] Pager  [phone] Weekend/On-Call Pager

## 2019-02-22 NOTE — Progress Notes (Signed)
Peripherally Inserted Central Catheter/Midline Placement  The IV Nurse has discussed with the patient and/or persons authorized to consent for the patient, the purpose of this procedure and the potential benefits and risks involved with this procedure.  The benefits include less needle sticks, lab draws from the catheter, and the patient may be discharged home with the catheter. Risks include, but not limited to, infection, bleeding, blood clot (thrombus formation), and puncture of an artery; nerve damage and irregular heartbeat and possibility to perform a PICC exchange if needed/ordered by physician.  Alternatives to this procedure were also discussed.  Bard Power PICC patient education guide, fact sheet on infection prevention and patient information card has been provided to patient /or left at bedside.    PICC/Midline Placement Documentation  PICC Triple Lumen 02/22/19 PICC Right Brachial 38 cm 0 cm (Active)  Indication for Insertion or Continuance of Line Limited venous access - need for IV therapy >5 days (PICC only) 02/22/2019  4:23 PM  Exposed Catheter (cm) 0 cm 02/22/2019  4:23 PM  Site Assessment Clean;Dry;Intact 02/22/2019  4:23 PM  Lumen #1 Status Flushed;Blood return noted 02/22/2019  4:23 PM  Lumen #2 Status Flushed;Blood return noted 02/22/2019  4:23 PM  Lumen #3 Status Flushed;Blood return noted 02/22/2019  4:23 PM  Dressing Type Transparent;Occlusive 02/22/2019  4:23 PM  Dressing Status Clean;Dry;Intact 02/22/2019  4:23 PM  Dressing Intervention New dressing 02/22/2019  4:23 PM  Dressing Change Due 02/24/19 02/22/2019  4:23 PM   Telephone consent signed by daugter    Christella Noa Albarece 02/22/2019, 4:25 PM

## 2019-02-22 NOTE — Progress Notes (Signed)
[facility] Progress Note Patient Name: NEVEYAH GARZON DOB: 1946-02-14 MRN: [id number]   Date of Service  02/22/2019  HPI/Events of Note  ABG on 50%/PRVC 20/TV 400/P 10 = 7.221/47.0/87.8.  eICU Interventions  Will order: 1. Increase PRVC rate to 26. 2. Repeat ABG at 11 PM.      Intervention Category Major Interventions: Acid-Base disturbance - evaluation and management;Respiratory failure - evaluation and management  Lysle Dingwall 02/22/2019, 9:38 PM

## 2019-02-22 NOTE — Progress Notes (Signed)
Called and spoke to [facility] MD about several issues. 1. Levophed on 25 mcg and still titrating up. 2. Vent dysnchrony; maxed on sedation  Order for 1L NS bolus and CVP ordered. CVP 16.  Order for stat ABG; RT notified. RT currently trying to find shield for PAPR. While waiting on RT x1 order for Vecuronium.  ABG results called in [place] at 2115  .

## 2019-02-22 NOTE — Significant Event (Signed)
Rapid Response Event Note  Overview:Called d/t pt resp distress. Pt came to unit on RA, SpO2-88%, RR- 30s. Time Called: 0055 Arrival Time: 0057 Event Type: Respiratory  Initial Focused Assessment: Pt alert and oriented, very agitated, unable to sit still, tripoding. SpO2-95% on 10 HFNC. RR-30-40s.  Lungs diminished t/o.   Interventions: ABG-7.35/33.5/110/18 Dr. Claudie Leach to bedside Plan of Care (if not transferred): Pt transferred to 2M13  Event Summary: Name of Physician Notified: Dr. Chrissie Noa at [facility]  Name of Consulting Physician Notified: James Ivanoff at [address] [place]

## 2019-02-22 NOTE — Progress Notes (Signed)
OG tube inserted, KUB done. Read by MD Chase Caller. Verbal ok to give meds per tube.

## 2019-02-23 ENCOUNTER — Inpatient Hospital Stay (HOSPITAL_COMMUNITY): Payer: Medicare Other

## 2019-02-23 LAB — HEPATIC FUNCTION PANEL
ALT: 31 U/L (ref 0–44)
AST: 65 U/L — ABNORMAL HIGH (ref 15–41)
Albumin: 2.2 g/dL — ABNORMAL LOW (ref 3.5–5.0)
Alkaline Phosphatase: 96 U/L (ref 38–126)
Bilirubin, Direct: 0.2 mg/dL (ref 0.0–0.2)
Indirect Bilirubin: 0.2 mg/dL — ABNORMAL LOW (ref 0.3–0.9)
Total Bilirubin: 0.4 mg/dL (ref 0.3–1.2)
Total Protein: 5.1 g/dL — ABNORMAL LOW (ref 6.5–8.1)

## 2019-02-23 LAB — CBC WITH DIFFERENTIAL/PLATELET
Abs Immature Granulocytes: 1.17 10*3/uL — ABNORMAL HIGH (ref 0.00–0.07)
Basophils Absolute: 0.1 10*3/uL (ref 0.0–0.1)
Basophils Relative: 0 %
Eosinophils Absolute: 0.1 10*3/uL (ref 0.0–0.5)
Eosinophils Relative: 0 %
HCT: 34.5 % — ABNORMAL LOW (ref 36.0–46.0)
Hemoglobin: 10 g/dL — ABNORMAL LOW (ref 12.0–15.0)
Immature Granulocytes: 4 %
Lymphocytes Relative: 6 %
Lymphs Abs: 1.8 10*3/uL (ref 0.7–4.0)
MCH: 25.8 pg — ABNORMAL LOW (ref 26.0–34.0)
MCHC: 29 g/dL — ABNORMAL LOW (ref 30.0–36.0)
MCV: 88.9 fL (ref 80.0–100.0)
Monocytes Absolute: 1.3 10*3/uL — ABNORMAL HIGH (ref 0.1–1.0)
Monocytes Relative: 4 %
Neutro Abs: 26.2 10*3/uL — ABNORMAL HIGH (ref 1.7–7.7)
Neutrophils Relative %: 86 %
Platelets: 115 10*3/uL — ABNORMAL LOW (ref 150–400)
RBC: 3.88 MIL/uL (ref 3.87–5.11)
RDW: 18.2 % — ABNORMAL HIGH (ref 11.5–15.5)
WBC: 30.6 10*3/uL — ABNORMAL HIGH (ref 4.0–10.5)
nRBC: 0 % (ref 0.0–0.2)

## 2019-02-23 LAB — BASIC METABOLIC PANEL
Anion gap: 6 (ref 5–15)
BUN: 37 mg/dL — ABNORMAL HIGH (ref 8–23)
CO2: 18 mmol/L — ABNORMAL LOW (ref 22–32)
Calcium: 7.4 mg/dL — ABNORMAL LOW (ref 8.9–10.3)
Chloride: 112 mmol/L — ABNORMAL HIGH (ref 98–111)
Creatinine, Ser: 1.83 mg/dL — ABNORMAL HIGH (ref 0.44–1.00)
GFR calc Af Amer: 31 mL/min — ABNORMAL LOW (ref >60)
GFR calc non Af Amer: 27 mL/min — ABNORMAL LOW (ref >60)
Glucose, Bld: 192 mg/dL — ABNORMAL HIGH (ref 70–99)
Potassium: 4.2 mmol/L (ref 3.5–5.1)
Sodium: 136 mmol/L (ref 135–145)

## 2019-02-23 LAB — URINE CULTURE: Culture: >=100000 — AB

## 2019-02-23 LAB — TROPONIN I
Troponin I: 4.44 ng/mL (ref <0.03)
Troponin I: 2.65 ng/mL (ref <0.03)
Troponin I: 1.5 ng/mL (ref <0.03)
Troponin I: 1.65 ng/mL (ref <0.03)

## 2019-02-23 LAB — PROTIME-INR
INR: 1.4 — ABNORMAL HIGH (ref 0.8–1.2)
Prothrombin Time: 16.8 seconds — ABNORMAL HIGH (ref 11.4–15.2)

## 2019-02-23 LAB — GLUCOSE, CAPILLARY
Glucose-Capillary: 140 mg/dL — ABNORMAL HIGH (ref 70–99)
Glucose-Capillary: 151 mg/dL — ABNORMAL HIGH (ref 70–99)
Glucose-Capillary: 159 mg/dL — ABNORMAL HIGH (ref 70–99)
Glucose-Capillary: 164 mg/dL — ABNORMAL HIGH (ref 70–99)
Glucose-Capillary: 167 mg/dL — ABNORMAL HIGH (ref 70–99)
Glucose-Capillary: 169 mg/dL — ABNORMAL HIGH (ref 70–99)
Glucose-Capillary: 169 mg/dL — ABNORMAL HIGH (ref 70–99)

## 2019-02-23 LAB — PHOSPHORUS
Phosphorus: 3.5 mg/dL (ref 2.5–4.6)
Phosphorus: 2.9 mg/dL (ref 2.5–4.6)

## 2019-02-23 LAB — LACTIC ACID, PLASMA: Lactic Acid, Venous: 1.2 mmol/L (ref 0.5–1.9)

## 2019-02-23 LAB — HEPARIN LEVEL (UNFRACTIONATED): Heparin Unfractionated: <0.1 IU/mL — ABNORMAL LOW (ref 0.30–0.70)

## 2019-02-23 LAB — LEGIONELLA PNEUMOPHILA SEROGP 1 UR AG: L. pneumophila Serogp 1 Ur Ag: NEGATIVE

## 2019-02-23 LAB — PROCALCITONIN: Procalcitonin: 19.26 ng/mL

## 2019-02-23 LAB — MAGNESIUM
Magnesium: 2.3 mg/dL (ref 1.7–2.4)
Magnesium: 2.5 mg/dL — ABNORMAL HIGH (ref 1.7–2.4)

## 2019-02-23 MED ORDER — HEPARIN (PORCINE) 25000 UT/250ML-% IV SOLN
1900.0000 [IU]/h | INTRAVENOUS | Status: DC
  Administered 2019-02-23: 850 [IU]/h via INTRAVENOUS
  Administered 2019-02-24: 1500 [IU]/h via INTRAVENOUS
  Administered 2019-02-25: 04:00:00 1900 [IU]/h via INTRAVENOUS
  Filled 2019-02-23 (×4): qty 250

## 2019-02-23 MED ORDER — HEPARIN BOLUS VIA INFUSION
3000.0000 [IU] | Freq: Once | INTRAVENOUS | Status: AC
  Administered 2019-02-23: 05:00:00 3000 [IU] via INTRAVENOUS
  Filled 2019-02-23: qty 3000

## 2019-02-23 NOTE — Progress Notes (Signed)
CRITICAL VALUE ALERT  Critical Value:  Troponin 4.44  Date & Time Notied:  02/23/2019 @ 4742  Provider Notified: Warren Lacy MD  Orders Received/Actions taken: MD made aware

## 2019-02-23 NOTE — Progress Notes (Signed)
[facility] Progress Note Patient Name: Martha Mora DOB: 01-02-1946 MRN: [id number]   Date of Service  02/23/2019  HPI/Events of Note  Troponin = 4.44. 12 Lead EKG ordered for 5 AM. Already on ASA. Can't B-Block d/t vasopressor requirement.  eICU Interventions  Will order: 1. Please do AM 12 Lead EKG now.  2. Continue to cycle Troponin.      Intervention Category Intermediate Interventions: Diagnostic test evaluation  Taelynn Mcelhannon [place] 02/23/2019, 4:19 AM

## 2019-02-23 NOTE — Progress Notes (Signed)
Tele video call provided for daughter [name]

## 2019-02-23 NOTE — Progress Notes (Signed)
Name: Martha Mora MRN: [id number] DOB: [birth year] CC: SOB   LOS: 1  [facility] / Critical Care Note   bRIEF 73 yo female with hx of COPD, HTN, CAD s/p PCI x1, MVR presenting today with worsening shortness of breath, fevers, chills and cough as well as foul smelling urine. She has been mostly at home and her only contact has been her son. No recent traveling or big group meetings. She continues to smoke E-cigs and has a 80 PPD hx of smoking.  She was admitted to the medical floor on 2 L [place] , CCM called as patient became tachypneic and was requiring escalating amount of oxygen on 11 L NRB. Tx to ICU and intubated.   Lines / Drains:  RUE PICC 4/6  ETT 4/6 >>  Cultures:  BC 4/5 e coli  Urine 4/5 >> e coli   Antibiotics: rocephin/azithro    Tests / Events: Intubated at 230 am 4/6 CT abdoen 3/5 >>> 11 mm stone left kidey, rt punctate stone, no hnosis   SUBJECTIVE/OVERNIGHT/INTERVAL HX   Remains critically ill on  Levophed and vasopressin Orally intubated, sedated on Versed/fentanyl  Low urine output    Vital Signs: Temp:  [98.3 F (36.8 C)-101.5 F (38.6 C)] 98.3 F (36.8 C) (04/07 0930) Pulse Rate:  [62-93] 65 (04/07 1030) Resp:  [11-28] 26 (04/07 1030) BP: (63-206)/(34-87) 108/54 (04/07 1030) SpO2:  [94 %-100 %] 95 % (04/07 1030) FiO2 (%):  [50 %] 50 % (04/07 0800) Weight:  [112.1 kg] 112.1 kg (04/07 0441) I/O last 3 completed shifts: In: 10975.2 [I.V.:5179.8; NG/GT:705; IV Piggyback:5090.4] Out: 107 [Urine:993]    Obese woman,  acutely ill, no distress Sedated on Versed and fentanyl, RA SS -3 Mild pallor, no icterus Decreased breath sounds bilateral, no rhonchi Soft nontender abdomen S1-S2 regular, sinus on monitor 1+ edema No rash    Ventilator settings: Vent Mode: PRVC FiO2 (%):  [50 %] 50 % Set Rate:  [14 bmp-26 bmp] 26 bmp Vt Set:  [400 mL] 400 mL PEEP:  [10 cmH20] 10 cmH20 Plateau Pressure:  [9 cmH20-21 cmH20] 21 cmH20    Assessment  and Plan: Active Problems:   Pneumonia   CAP (community acquired pneumonia)   Acute respiratory failure with hypoxemia ([place]) ASSESSMENT / PLAN:  PULMONARY A:  Baseline copd nos -FEV1 101 % 12/2016 Acute resp failure due to bacterial sepsis but also covid rule out . 60% fio2 and peep 10  P:   PRVC -minimal auto PEEP on current respiratory rate 24 BD (mdi v neb)  CARDIAC A: nstemi , known CAD s/p stent RCA 05/2016 QTC long Septic shock P: dependng on course consider echo Dc plaquenil, low risk COVID Levophed + vaso for map > 65  ASA 325 + heparin IV  RENAL A:  AKI (baseline creat 0.8mg %) Hypomag - repleted P:  Renal US to r/o obstructive uropathy, she did not have hydronephrosis on CT scan 3/5 Dc IVFs, she is 11 L +    GASTROINTESTINAL A:   NPO  P:   ct TF  HEMATOLOGIC A:  Anemia critical illness  P:  - PRBC for hgb <7.9    INFECTIOUS A:   E coli UTI Rule out covid   P:   Ct ceftriaxone   ENDOCRINE A:   At risk hyperglucemia P:   ssi  NEUROLOGIC A:   agitated P:   fent ggtt Versed gtt Goal RASS -1 restraints  GLOBAL-low risk CO VID, she has alternative explanation  for septic shock    Best practices / Disposition: -->Code Status: FULL -->DVT Px:HSQ -->GI Px:PPI -->Diet:  TF - family: none at bedside  The patient is critically ill with multiple organ systems failure and requires high complexity decision making for assessment and support, frequent evaluation and titration of therapies, application of advanced monitoring technologies and extensive interpretation of multiple databases. Critical Care Time devoted to patient care services described in this note independent of APP/resident  time is 35 minutes.   Kara Mead MD. [place] Flood. [facility] Pager [phone] If no response call [phone]     02/23/2019 10:35 AM

## 2019-02-23 NOTE — Progress Notes (Signed)
[facility] Progress Note Patient Name: Martha Mora DOB: 01-11-1946 MRN: [id number]   Date of Service  02/23/2019  HPI/Events of Note  EKG reveals NSR and no acute changes. However, concern for ACS vs NSTEMI. Hgb = 10.0.   eICU Interventions  Will order: 1. Heparin per pharmacy consult.      Intervention Category Intermediate Interventions: Diagnostic test evaluation  Sommer,Steven Cornelia Copa 02/23/2019, 4:56 AM
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
## 2019-02-23 NOTE — Progress Notes (Signed)
Family practice teaching service appreciates the excellent care that Martha Mora is receiving.  We are following along with her clinical course and are ready to receive her when deemed appropriate.

## 2019-02-23 NOTE — Progress Notes (Signed)
On assessment patients Fentanyl drip running above set titratable limit per order. Pt having periods of vent dysynchrony. Spoke to Dr Gilford Raid about increasing max dose on Fentanyl or turning Fentanyl down to ordered dose. Order to increase Fentanyl order to 440mcg.

## 2019-02-24 ENCOUNTER — Inpatient Hospital Stay (HOSPITAL_COMMUNITY): Payer: Medicare Other

## 2019-02-24 DIAGNOSIS — R6521 Severe sepsis with septic shock: Secondary | ICD-10-CM

## 2019-02-24 DIAGNOSIS — A419 Sepsis, unspecified organism: Secondary | ICD-10-CM

## 2019-02-24 LAB — BASIC METABOLIC PANEL
Anion gap: 7 (ref 5–15)
BUN: 41 mg/dL — ABNORMAL HIGH (ref 8–23)
CO2: 19 mmol/L — ABNORMAL LOW (ref 22–32)
Calcium: 7.8 mg/dL — ABNORMAL LOW (ref 8.9–10.3)
Chloride: 110 mmol/L (ref 98–111)
Creatinine, Ser: 1.38 mg/dL — ABNORMAL HIGH (ref 0.44–1.00)
GFR calc Af Amer: 43 mL/min — ABNORMAL LOW (ref >60)
GFR calc non Af Amer: 37 mL/min — ABNORMAL LOW (ref >60)
Glucose, Bld: 162 mg/dL — ABNORMAL HIGH (ref 70–99)
Potassium: 4.3 mmol/L (ref 3.5–5.1)
Sodium: 136 mmol/L (ref 135–145)

## 2019-02-24 LAB — CBC WITH DIFFERENTIAL/PLATELET
Abs Immature Granulocytes: 0.69 10*3/uL — ABNORMAL HIGH (ref 0.00–0.07)
Basophils Absolute: 0.1 10*3/uL (ref 0.0–0.1)
Basophils Relative: 0 %
Eosinophils Absolute: 0.3 10*3/uL (ref 0.0–0.5)
Eosinophils Relative: 1 %
HCT: 31.8 % — ABNORMAL LOW (ref 36.0–46.0)
Hemoglobin: 9.2 g/dL — ABNORMAL LOW (ref 12.0–15.0)
Immature Granulocytes: 3 %
Lymphocytes Relative: 6 %
Lymphs Abs: 1.4 10*3/uL (ref 0.7–4.0)
MCH: 25.8 pg — ABNORMAL LOW (ref 26.0–34.0)
MCHC: 28.9 g/dL — ABNORMAL LOW (ref 30.0–36.0)
MCV: 89.3 fL (ref 80.0–100.0)
Monocytes Absolute: 1.1 10*3/uL — ABNORMAL HIGH (ref 0.1–1.0)
Monocytes Relative: 5 %
Neutro Abs: 20 10*3/uL — ABNORMAL HIGH (ref 1.7–7.7)
Neutrophils Relative %: 85 %
Platelets: 121 10*3/uL — ABNORMAL LOW (ref 150–400)
RBC: 3.56 MIL/uL — ABNORMAL LOW (ref 3.87–5.11)
RDW: 18.6 % — ABNORMAL HIGH (ref 11.5–15.5)
WBC: 23.6 10*3/uL — ABNORMAL HIGH (ref 4.0–10.5)
nRBC: 0 % (ref 0.0–0.2)

## 2019-02-24 LAB — HEPARIN LEVEL (UNFRACTIONATED)
Heparin Unfractionated: <0.1 IU/mL — ABNORMAL LOW (ref 0.30–0.70)
Heparin Unfractionated: 0.19 IU/mL — ABNORMAL LOW (ref 0.30–0.70)
Heparin Unfractionated: 0.37 IU/mL (ref 0.30–0.70)

## 2019-02-24 LAB — CULTURE, BLOOD (ROUTINE X 2): Special Requests: ADEQUATE

## 2019-02-24 LAB — MAGNESIUM: Magnesium: 2.5 mg/dL — ABNORMAL HIGH (ref 1.7–2.4)

## 2019-02-24 LAB — GLUCOSE, CAPILLARY
Glucose-Capillary: 108 mg/dL — ABNORMAL HIGH (ref 70–99)
Glucose-Capillary: 108 mg/dL — ABNORMAL HIGH (ref 70–99)
Glucose-Capillary: 110 mg/dL — ABNORMAL HIGH (ref 70–99)
Glucose-Capillary: 94 mg/dL (ref 70–99)
Glucose-Capillary: 96 mg/dL (ref 70–99)

## 2019-02-24 LAB — NOVEL CORONAVIRUS, NAA (HOSP ORDER, SEND-OUT TO REF LAB; TAT 18-24 HRS): SARS-CoV-2, NAA: NOT DETECTED

## 2019-02-24 LAB — PHOSPHORUS: Phosphorus: 2 mg/dL — ABNORMAL LOW (ref 2.5–4.6)

## 2019-02-24 LAB — PROCALCITONIN: Procalcitonin: 13.27 ng/mL

## 2019-02-24 MED ORDER — MIDAZOLAM HCL 2 MG/2ML IJ SOLN
1.0000 mg | INTRAMUSCULAR | Status: DC | PRN
  Administered 2019-02-26 – 2019-02-27 (×9): 2 mg via INTRAVENOUS
  Administered 2019-02-27: 10:00:00 4 mg via INTRAVENOUS
  Filled 2019-02-24 (×10): qty 2

## 2019-02-24 MED ORDER — POTASSIUM PHOSPHATES 15 MMOLE/5ML IV SOLN
30.0000 mmol | Freq: Once | INTRAVENOUS | Status: AC
  Administered 2019-02-24: 30 mmol via INTRAVENOUS
  Filled 2019-02-24: qty 10

## 2019-02-24 MED ORDER — FUROSEMIDE 10 MG/ML IJ SOLN
40.0000 mg | Freq: Once | INTRAMUSCULAR | Status: AC
  Administered 2019-02-24: 09:00:00 40 mg via INTRAVENOUS
  Filled 2019-02-24: qty 4

## 2019-02-24 MED ORDER — HEPARIN BOLUS VIA INFUSION
3000.0000 [IU] | Freq: Once | INTRAVENOUS | Status: AC
  Administered 2019-02-24: 3000 [IU] via INTRAVENOUS
  Filled 2019-02-24: qty 3000

## 2019-02-24 MED ORDER — PANTOPRAZOLE SODIUM 40 MG PO PACK
40.0000 mg | PACK | Freq: Every day | ORAL | Status: DC
  Administered 2019-02-24 – 2019-03-03 (×8): 40 mg
  Filled 2019-02-24 (×8): qty 20

## 2019-02-24 MED ORDER — MIDAZOLAM HCL 2 MG/2ML IJ SOLN
INTRAMUSCULAR | Status: AC
  Filled 2019-02-24: qty 2

## 2019-02-24 NOTE — Progress Notes (Signed)
Patient asynchronous with vent. Maxed out on Fentanyl gtt. Given versed pushes. RT assessed and saline lavaged patient. Inline and oral suction. Multiple interventions.  MD Elsworth Soho aware. Came up to assess patient. Verbalized that it was okay if she was intermittently asynchronous. Stated the plan for tomorrow is to extubate and place on Bipap.   Patient on SIMV/Pressure support. Tolerating well. Will continue to monitor.
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
## 2019-02-24 NOTE — Progress Notes (Signed)
Unable to turn off Fentanyl gtt, patient gets agitated and restless. Still on Vent support. Episodes of bradycardia, with lowest HR 57. BP stable.   MD Elsworth Soho updated. Stated to continue fentanyl drip with versed pushes PRN. Possible extubation tomorrow 4/9. Titrating fentanyl gtt down. Current HR 61. Will continue to monitor.

## 2019-02-24 NOTE — Progress Notes (Signed)
ANTICOAGULATION CONSULT NOTE - Follow Up Consult  Pharmacy Consult for heparin Indication: chest pain/ACS  Labs: Recent Labs    02/21/19 1423  02/22/19 0745 02/22/19 0852  02/23/19 0200 02/23/19 0941 02/23/19 1345 02/23/19 2000 02/23/19 2323  HGB 12.9   < > 10.5* 10.9*  --  10.0*  --   --   --   --   HCT 43.2   < > 33.9* 32.0*  --  34.5*  --   --   --   --   PLT 227  --  PLATELET CLUMPS NOTED ON SMEAR, UNABLE TO ESTIMATE  --   --  115*  --   --   --   --   LABPROT  --   --  17.3*  --   --  16.8*  --   --   --   --   INR  --   --  1.4*  --   --  1.4*  --   --   --   --   HEPARINUNFRC  --   --   --   --   --   --   --  <0.10*  --  <0.10*  CREATININE 1.36*  --  1.71*  --   --  1.83*  --   --   --   --   TROPONINI  --   --   --   --    < > 4.44* 2.65* 1.50* 1.65*  --    < > = values in this interval not displayed.    Assessment: 73yo female subtherapeutic on heparin after rate change; no gtt issues or signs of bleeding per RN.  Goal of Therapy:  Heparin level 0.3-0.7 units/ml   Plan:  Will rebolus with heparin 3000 units and increase heparin gtt by 4 units/kgABW/hr to 1500 units/hr and check level in 8 hours.    Wynona Neat, PharmD, BCPS  02/24/2019,12:39 AM

## 2019-02-24 NOTE — Progress Notes (Signed)
FPTS Social Progress Note  Following along socially, appreciate care by CCM and will resume care when patient stable for transfer out to floor.  Bufford Lope, DO 02/24/2019, 7:51 AM PGY-3, [facility] pager [phone]

## 2019-02-24 NOTE — Progress Notes (Signed)
Pt becoming dyssynchronous with vent.   Pt with RASS of -3. PRN dose of Fentanyl given and increased drip. Called and spoke with RT and ELINK to see if any adjustments could be made to vent before given more sedation.  Per recommendations of ELINK prn dose of Versed given. No vent changes made at this time.  Will continue to monitor.

## 2019-02-24 NOTE — Progress Notes (Signed)
Name: Martha Mora MRN: [id number] DOB: Oct 01, 1946 CC: SOB   LOS: 2  [facility] / Critical Care Note   4/5  with worsening shortness of breath, fevers, chills and cough as well as foul smelling urine. She has been mostly at home and her only contact has been her son. No recent traveling or big group meetings. She continues to smoke E-cigs and has a 80 PPD hx of smoking.  She was admitted to the medical floor on 2 L [place] , CCM called as patient became tachypneic and was requiring escalating amount of oxygen on 11 L [place]. Tx to ICU and intubated.   Lines / Drains:  RUE PICC 4/6  ETT 4/6 >>  Cultures:  BC 4/5 e coli  Urine 4/5 >> e coli   Antibiotics:  rocephin 4/6 >>  Tests / Events:  CT abdomen 3/5 >>> 11 mm stone left kidey, rt punctate stone, no hnosis US renal   SUBJECTIVE/OVERNIGHT/INTERVAL HX   Critically ill, orally intubated, on lower dose Levophed Urine output improving Sedated on fentanyl gtt    Vital Signs: Temp:  [98.4 F (36.9 C)-100.3 F (37.9 C)] 98.9 F (37.2 C) (04/08 0900) Pulse Rate:  [59-77] 68 (04/08 0900) Resp:  [12-28] 16 (04/08 0900) BP: (73-135)/(46-69) 127/69 (04/08 0900) SpO2:  [93 %-100 %] 94 % (04/08 0900) FiO2 (%):  [40 %-50 %] 40 % (04/08 0746) Weight:  [114.1 kg] 114.1 kg (04/08 0500) I/O last 3 completed shifts: In: 5484.9 [I.V.:3659.9; NG/GT:1625; IV Piggyback:200] Out: 2094 [Urine:1516]    Obese woman,  acutely ill, no distress, orally intubated Sedated on fentanyl gtt, RA SS -2 Mild pallor, no icterus Decreased breath sounds bilateral, no rhonchi Soft nontender abdomen S1-S2 regular, sinus on monitor 1+ edema No rash    Ventilator settings: Vent Mode: PRVC FiO2 (%):  [40 %-50 %] 40 % Set Rate:  [26 bmp] 26 bmp Vt Set:  [400 mL] 400 mL PEEP:  [8 cmH20] 8 cmH20 Plateau Pressure:  [21 cmH20-24 cmH20] 24 cmH20    Assessment and Plan: Active Problems:   Pneumonia   CAP (community acquired pneumonia)   Acute  respiratory failure with hypoxemia ([place]) ASSESSMENT / PLAN:  PULMONARY A:  Baseline copd nos -FEV1 101 % 12/2016 Acute resp failure due to bacterial sepsis but also covid rule out   P:   Drop PEEP to 5 Spontaneous breathing trials with goal extubation BD (mdi v neb)  CARDIAC A: nstemi , known CAD s/p stent RCA 05/2016 QTC long Septic shock P: Await echo Dc plaquenil, low risk COVID Levophed can be tapered off ASA 325 + heparin IV x 48h  RENAL A:  AKI (baseline creat 0.8mg %) Hypomag - repleted P:  Renal US to r/o obstructive uropathy, she did not have hydronephrosis on CT scan 3/5 Lasix 40 x 1    GASTROINTESTINAL A:   NPO  P:   ct TF  HEMATOLOGIC A:  Anemia critical illness  P:  - PRBC for hgb <7.9    INFECTIOUS A:   E coli UTI Rule out covid   P:   Ct ceftriaxone Await COVID test   ENDOCRINE A:   At risk hyperglucemia P:   ssi  NEUROLOGIC A:   agitated P:   fent ggtt Versed int Goal RASS -1 restraints    Best practices / Disposition: -->Code Status: FULL -->DVT Px:HSQ -->GI Px:PPI -->Diet:  TF - family: none at bedside  SUmmary -improving septic shock, full to diurese and extubate  soon  The patient is critically ill with multiple organ systems failure and requires high complexity decision making for assessment and support, frequent evaluation and titration of therapies, application of advanced monitoring technologies and extensive interpretation of multiple databases. Critical Care Time devoted to patient care services described in this note independent of APP/resident  time is 33 minutes.    Kara Mead MD. [facility].  Pulmonary & Critical care Pager [phone] If no response call [phone]     02/24/2019 10:33 AM
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
## 2019-02-25 LAB — CBC WITH DIFFERENTIAL/PLATELET
Abs Immature Granulocytes: 0.21 10*3/uL — ABNORMAL HIGH (ref 0.00–0.07)
Basophils Absolute: 0.1 10*3/uL (ref 0.0–0.1)
Basophils Relative: 0 %
Eosinophils Absolute: 0.4 10*3/uL (ref 0.0–0.5)
Eosinophils Relative: 3 %
HCT: 29.7 % — ABNORMAL LOW (ref 36.0–46.0)
Hemoglobin: 8.8 g/dL — ABNORMAL LOW (ref 12.0–15.0)
Immature Granulocytes: 1 %
Lymphocytes Relative: 13 %
Lymphs Abs: 2.1 10*3/uL (ref 0.7–4.0)
MCH: 26.6 pg (ref 26.0–34.0)
MCHC: 29.6 g/dL — ABNORMAL LOW (ref 30.0–36.0)
MCV: 89.7 fL (ref 80.0–100.0)
Monocytes Absolute: 1.4 10*3/uL — ABNORMAL HIGH (ref 0.1–1.0)
Monocytes Relative: 9 %
Neutro Abs: 12.6 10*3/uL — ABNORMAL HIGH (ref 1.7–7.7)
Neutrophils Relative %: 74 %
Platelets: 193 10*3/uL (ref 150–400)
RBC: 3.31 MIL/uL — ABNORMAL LOW (ref 3.87–5.11)
RDW: 18.9 % — ABNORMAL HIGH (ref 11.5–15.5)
WBC: 16.9 10*3/uL — ABNORMAL HIGH (ref 4.0–10.5)
nRBC: 0 % (ref 0.0–0.2)

## 2019-02-25 LAB — BASIC METABOLIC PANEL
Anion gap: 9 (ref 5–15)
BUN: 52 mg/dL — ABNORMAL HIGH (ref 8–23)
CO2: 22 mmol/L (ref 22–32)
Calcium: 8.2 mg/dL — ABNORMAL LOW (ref 8.9–10.3)
Chloride: 107 mmol/L (ref 98–111)
Creatinine, Ser: 1.58 mg/dL — ABNORMAL HIGH (ref 0.44–1.00)
GFR calc Af Amer: 37 mL/min — ABNORMAL LOW (ref >60)
GFR calc non Af Amer: 32 mL/min — ABNORMAL LOW (ref >60)
Glucose, Bld: 95 mg/dL (ref 70–99)
Potassium: 4.7 mmol/L (ref 3.5–5.1)
Sodium: 138 mmol/L (ref 135–145)

## 2019-02-25 LAB — GLUCOSE, CAPILLARY
Glucose-Capillary: 100 mg/dL — ABNORMAL HIGH (ref 70–99)
Glucose-Capillary: 90 mg/dL (ref 70–99)
Glucose-Capillary: 95 mg/dL (ref 70–99)
Glucose-Capillary: 122 mg/dL — ABNORMAL HIGH (ref 70–99)
Glucose-Capillary: 139 mg/dL — ABNORMAL HIGH (ref 70–99)

## 2019-02-25 LAB — MAGNESIUM: Magnesium: 2.4 mg/dL (ref 1.7–2.4)

## 2019-02-25 LAB — PHOSPHORUS: Phosphorus: 3.7 mg/dL (ref 2.5–4.6)

## 2019-02-25 LAB — HEPARIN LEVEL (UNFRACTIONATED): Heparin Unfractionated: 0.38 IU/mL (ref 0.30–0.70)

## 2019-02-25 MED ORDER — DEXMEDETOMIDINE HCL IN NACL 400 MCG/100ML IV SOLN
0.4000 ug/kg/h | INTRAVENOUS | Status: DC
  Administered 2019-02-25 (×2): 1.2 ug/kg/h via INTRAVENOUS
  Administered 2019-02-25: 0.7 ug/kg/h via INTRAVENOUS
  Administered 2019-02-25: 0.8 ug/kg/h via INTRAVENOUS
  Administered 2019-02-25 – 2019-02-26 (×2): 1.2 ug/kg/h via INTRAVENOUS
  Administered 2019-02-26: 1 ug/kg/h via INTRAVENOUS
  Administered 2019-02-26 – 2019-02-27 (×7): 1.2 ug/kg/h via INTRAVENOUS
  Administered 2019-02-27: 0.6 ug/kg/h via INTRAVENOUS
  Administered 2019-02-27: 1 ug/kg/h via INTRAVENOUS
  Administered 2019-02-27: 14:00:00 1.2 ug/kg/h via INTRAVENOUS
  Administered 2019-02-27: 1.201 ug/kg/h via INTRAVENOUS
  Administered 2019-02-28 (×5): 1.2 ug/kg/h via INTRAVENOUS
  Filled 2019-02-25 (×24): qty 100

## 2019-02-25 MED ORDER — HEPARIN SODIUM (PORCINE) 5000 UNIT/ML IJ SOLN
5000.0000 [IU] | Freq: Three times a day (TID) | INTRAMUSCULAR | Status: DC
  Administered 2019-02-25 – 2019-03-03 (×20): 5000 [IU] via SUBCUTANEOUS
  Filled 2019-02-25 (×20): qty 1

## 2019-02-25 MED ORDER — FUROSEMIDE 10 MG/ML IJ SOLN
40.0000 mg | Freq: Two times a day (BID) | INTRAMUSCULAR | Status: AC
  Administered 2019-02-25 (×2): 40 mg via INTRAVENOUS
  Filled 2019-02-25 (×2): qty 4

## 2019-02-25 NOTE — Progress Notes (Signed)
Name: Martha Mora MRN: [id number] DOB: 11-07-1946 CC: SOB   LOS: 3  [facility] Critical Care Note   73 yo adm 4/5  with worsening shortness of breath, fevers, chills and cough as well as foul smelling urine. She continues to smoke E-cigs and has a 80 PPD hx of smoking.   She was admitted to the medical floor on 2 L [place] , CCM called as patient became tachypneic and was requiring escalating amount of oxygen on 11 L NRB. Tx to ICU and intubated. Found to have E. coli septic shock CO VID ruled out   Lines / Drains:  RUE PICC 4/6  ETT 4/6 >>  Cultures:  BC 4/5 e coli  Urine 4/5 >> e coli   Antibiotics:  rocephin 4/6 >>  Tests / Events:  CT abdomen 3/5 >>> 11 mm stone left kidey, rt punctate stone, no hnosis US renal   4/8 vent asynchrony  SUBJECTIVE/OVERNIGHT/INTERVAL HX   Urine output increased with Lasix, back down on second shift Afebrile Remains critically ill, intubated on low-dose Levophed Severe vent asynchrony on 4/8 requiring increased sedation    Vital Signs: Temp:  [97.9 F (36.6 C)-99.9 F (37.7 C)] 99.9 F (37.7 C) (04/09 0409) Pulse Rate:  [54-99] 58 (04/09 0800) Resp:  [8-27] 13 (04/09 0800) BP: (80-151)/(43-80) 107/56 (04/09 0800) SpO2:  [89 %-100 %] 93 % (04/09 0800) FiO2 (%):  [30 %-40 %] 30 % (04/09 0724) Weight:  [115.6 kg] 115.6 kg (04/09 0500) I/O last 3 completed shifts: In: 3713.3 [I.V.:1671.7; NG/GT:1520; IV Piggyback:521.6] Out: 5643 [Urine:1705]    Obese woman,  acutely ill, no distress, orally intubated Sedated on fentanyl gtt, RA SS -2 Mild pallor, no icterus, no JVD Decreased breath sounds bilateral, no rhonchi Soft nontender abdomen, mild abdominal breathing S1-S2 regular, sinus on monitor 1+ edema No rash    Ventilator settings: Vent Mode: SIMV;PSV;Volume support FiO2 (%):  [30 %-40 %] 30 % Set Rate:  [15 bmp-26 bmp] 15 bmp Vt Set:  [400 mL] 400 mL PEEP:  [5 cmH20] 5 cmH20 Pressure Support:  [15 cmH20] 15  cmH20 Plateau Pressure:  [21 cmH20-22 cmH20] 21 cmH20    Assessment and Plan: Active Problems:   Pneumonia   CAP (community acquired pneumonia)   Acute respiratory failure with hypoxemia ([place]) ASSESSMENT / PLAN:  PULMONARY A:  Baseline copd nos -FEV1 101 % 12/2016 Acute resp failure due to bacterial sepsis  Severe vent asynchrony P:   Spontaneous breathing trials with goal extubation Rested on SIMV/pressure support BD (mdi v neb)  CARDIAC A: nstemi , known CAD s/p stent RCA 05/2016 QTC long Septic shock P: Await echo Levophed can be tapered off ASA 325  dc heparin IV -completed 48 hours Cardiology consult once acute issues resolved  RENAL A:  AKI (baseline creat 0.8mg %) Hypomag - repleted P:  Renal US to r/o obstructive uropathy, she did not have hydronephrosis on CT scan 3/5 Lasix 40 q 12-for negative balance to enable extubation even though creatinine increased slightly on the 4/9 after Lasix given 4/8    GASTROINTESTINAL A:   NPO  P:   ct TFs  HEMATOLOGIC A:  Anemia critical illness  P:  - PRBC for hgb <7.9    INFECTIOUS A:   E coli UTI covid RULED OUT  P:   Ct ceftriaxone    ENDOCRINE A:   At risk hyperglucemia P:   ssi  NEUROLOGIC A:   Agitated /vent asynchrony P:   fent  gtt Versed int Will trial precedex, limited by bradycardia Goal RASS 0 restraints    Best practices / Disposition: -->Code Status: FULL -->DVT Px:HSQ -->GI Px:PPI -->Diet:  TF - family: daughter updated 4/8  SUmmary -septic shock has improved, need more negative balance but getting closer to extubation, severe vent asynchrony impeding  The patient is critically ill with multiple organ systems failure and requires high complexity decision making for assessment and support, frequent evaluation and titration of therapies, application of advanced monitoring technologies and extensive interpretation of multiple databases. Critical Care Time devoted to  patient care services described in this note independent of APP/resident  time is 35 minutes.    Kara Mead MD. [facility]. [facility] Pager [phone] If no response call [phone]     02/25/2019 8:19 AM

## 2019-02-25 NOTE — Progress Notes (Addendum)
FPTS Social Progress Note  Following along socially, appreciate care by CCM and will resume care when patient stable for transfer out to floor.   Marjie Skiff, MD [place], PGY-3 Service pager [phone]

## 2019-02-25 NOTE — Progress Notes (Signed)
RT Note:  Patient sats kept dropping into upper 80s and was staying at 90%.  Raised FIO2 to 35% from 30, patient sat is now 93%.  Will continue to monitor.

## 2019-02-25 NOTE — Progress Notes (Signed)
Facilitated video phone call visit with pt and her daughter via Elink camera.

## 2019-02-26 ENCOUNTER — Inpatient Hospital Stay (HOSPITAL_COMMUNITY): Payer: Medicare Other

## 2019-02-26 DIAGNOSIS — I361 Nonrheumatic tricuspid (valve) insufficiency: Secondary | ICD-10-CM

## 2019-02-26 DIAGNOSIS — I340 Nonrheumatic mitral (valve) insufficiency: Secondary | ICD-10-CM

## 2019-02-26 LAB — BASIC METABOLIC PANEL
Anion gap: 9 (ref 5–15)
BUN: 51 mg/dL — ABNORMAL HIGH (ref 8–23)
CO2: 25 mmol/L (ref 22–32)
Calcium: 9 mg/dL (ref 8.9–10.3)
Chloride: 107 mmol/L (ref 98–111)
Creatinine, Ser: 1.38 mg/dL — ABNORMAL HIGH (ref 0.44–1.00)
GFR calc Af Amer: 43 mL/min — ABNORMAL LOW (ref >60)
GFR calc non Af Amer: 37 mL/min — ABNORMAL LOW (ref >60)
Glucose, Bld: 154 mg/dL — ABNORMAL HIGH (ref 70–99)
Potassium: 4.4 mmol/L (ref 3.5–5.1)
Sodium: 141 mmol/L (ref 135–145)

## 2019-02-26 LAB — CBC WITH DIFFERENTIAL/PLATELET
Abs Immature Granulocytes: 0.33 10*3/uL — ABNORMAL HIGH (ref 0.00–0.07)
Basophils Absolute: 0.1 10*3/uL (ref 0.0–0.1)
Basophils Relative: 0 %
Eosinophils Absolute: 0.1 10*3/uL (ref 0.0–0.5)
Eosinophils Relative: 1 %
HCT: 30.4 % — ABNORMAL LOW (ref 36.0–46.0)
Hemoglobin: 9.4 g/dL — ABNORMAL LOW (ref 12.0–15.0)
Immature Granulocytes: 2 %
Lymphocytes Relative: 10 %
Lymphs Abs: 1.7 10*3/uL (ref 0.7–4.0)
MCH: 26.4 pg (ref 26.0–34.0)
MCHC: 30.9 g/dL (ref 30.0–36.0)
MCV: 85.4 fL (ref 80.0–100.0)
Monocytes Absolute: 1.5 10*3/uL — ABNORMAL HIGH (ref 0.1–1.0)
Monocytes Relative: 8 %
Neutro Abs: 13.7 10*3/uL — ABNORMAL HIGH (ref 1.7–7.7)
Neutrophils Relative %: 79 %
Platelets: 147 10*3/uL — ABNORMAL LOW (ref 150–400)
RBC: 3.56 MIL/uL — ABNORMAL LOW (ref 3.87–5.11)
RDW: 18 % — ABNORMAL HIGH (ref 11.5–15.5)
WBC: 16.9 10*3/uL — ABNORMAL HIGH (ref 4.0–10.5)
nRBC: 0.1 % (ref 0.0–0.2)

## 2019-02-26 LAB — ECHOCARDIOGRAM LIMITED
Height: 59 in
Weight: 3978.86 oz

## 2019-02-26 LAB — GLUCOSE, CAPILLARY
Glucose-Capillary: 124 mg/dL — ABNORMAL HIGH (ref 70–99)
Glucose-Capillary: 132 mg/dL — ABNORMAL HIGH (ref 70–99)
Glucose-Capillary: 141 mg/dL — ABNORMAL HIGH (ref 70–99)
Glucose-Capillary: 142 mg/dL — ABNORMAL HIGH (ref 70–99)
Glucose-Capillary: 146 mg/dL — ABNORMAL HIGH (ref 70–99)
Glucose-Capillary: 148 mg/dL — ABNORMAL HIGH (ref 70–99)
Glucose-Capillary: 151 mg/dL — ABNORMAL HIGH (ref 70–99)

## 2019-02-26 LAB — PHOSPHORUS: Phosphorus: 4.3 mg/dL (ref 2.5–4.6)

## 2019-02-26 LAB — MAGNESIUM: Magnesium: 2.1 mg/dL (ref 1.7–2.4)

## 2019-02-26 MED ORDER — ROSUVASTATIN CALCIUM 20 MG PO TABS
20.0000 mg | ORAL_TABLET | Freq: Every day | ORAL | Status: DC
  Administered 2019-02-26 – 2019-03-04 (×7): 20 mg
  Filled 2019-02-26 (×7): qty 1

## 2019-02-26 MED ORDER — ASPIRIN 325 MG PO TABS
325.0000 mg | ORAL_TABLET | Freq: Every day | ORAL | Status: DC
  Administered 2019-02-26 – 2019-03-03 (×6): 325 mg
  Filled 2019-02-26 (×6): qty 1

## 2019-02-26 MED ORDER — FUROSEMIDE 10 MG/ML IJ SOLN
40.0000 mg | Freq: Once | INTRAMUSCULAR | Status: AC
  Administered 2019-02-26: 40 mg via INTRAVENOUS
  Filled 2019-02-26: qty 4

## 2019-02-26 MED ORDER — ALBUTEROL SULFATE (2.5 MG/3ML) 0.083% IN NEBU
2.5000 mg | INHALATION_SOLUTION | RESPIRATORY_TRACT | Status: DC | PRN
  Administered 2019-02-26: 2.5 mg via RESPIRATORY_TRACT
  Filled 2019-02-26 (×2): qty 3

## 2019-02-26 NOTE — Progress Notes (Signed)
Name: Martha Mora MRN: [id number] DOB: February 19, 1946 CC: SOB   LOS: 4  [facility] / Critical Care Note   73 yo adm 4/5  with worsening shortness of breath, fevers, chills and cough as well as foul smelling urine. She continues to smoke E-cigs and has a 80 PPD hx of smoking.   She was admitted to the medical floor on 2 L [place] , CCM called as patient became tachypneic and was requiring escalating amount of oxygen on 11 L NR[place]. Tx to ICU and intubated. Found to have E. coli septic shock CO VID ruled out   Lines / Drains:  RUE PICC 4/6  ETT 4/6 >>  Cultures:  BC 4/5 e coli  Urine 4/5 >> e coli   Antibiotics:  rocephin 4/6 >>   EVENTS   CT abdomen 3/5 >>> 11 mm stone left kidey, rt punctate stone, no hnosis US renal   4/8 vent asynchrony  4/9 -  Urine output increased with Lasix, back down on second shift Afebrile Remains critically ill, intubated on low-dose Levophed Severe vent asynchrony on 4/8 requiring increased sedation  SUBJECTIVE/OVERNIGHT/INTERVAL HX    4/10 - desaturated on vent at fio2 30%. Seems dysnch with vent. On fent gtt and precedex gtt   Vital Signs: Temp:  [98.9 F (37.2 C)-100.8 F (38.2 C)] 100.8 F (38.2 C) (04/10 0800) Pulse Rate:  [57-98] 98 (04/10 1000) Resp:  [13-20] 20 (04/10 1000) BP: (112-204)/(51-106) 204/86 (04/10 1000) SpO2:  [90 %-100 %] 91 % (04/10 1000) FiO2 (%):  [30 %-35 %] 30 % (04/10 0800) Weight:  [112.8 kg] 112.8 kg (04/10 0459) I/O last 3 completed shifts: In: 3571.2 [I.V.:1971.2; NG/GT:1500; IV Piggyback:100.1] Out: 4360 [Urine:4360]   General Appearance:  Looks criticall ill OBESE - + Head:  Normocephalic, without obvious abnormality, atraumatic Eyes:  PERRL - yes, conjunctiva/corneas - muddy     Ears:  Normal external ear canals, both ears Nose:  G tube - no Throat:  ETT TUBE - yes , OG tube - yes Neck:  Supple,  No enlargement/tenderness/nodules Lungs: Clear to auscultation bilaterally, Ventilator    Synchrony - no Heart:  S1 and S2 normal, no murmur, CVP - no.  Pressors - no Abdomen:  Soft, no masses, no organomegaly Genitalia / Rectal:  Not done Extremities:  Extremities- intact Skin:  ntact in exposed areas . Sacral area - not examined Neurologic:  Sedation - feng gtt and precedex -> RASS - -3 . Moves all 4s - yes     LABS    PULMONARY Recent Labs  Lab 02/22/19 0155 02/22/19 0337 02/22/19 0852 02/22/19 2040 02/22/19 2305  PHART 7.350 6.996* 7.254* 7.221* 7.327*  PCO2ART 33.5 96.0* 43.4 47.0 31.5*  PO2ART 110* 203.0* 99.0 87.8 153*  HCO3 18.0* 23.4 19.2* 18.6* 16.0*  TCO2  --  26 20*  --   --   O2SAT 97.1 99.0 96.0 95.4 98.6    CBC Recent Labs  Lab 02/24/19 0150 02/25/19 0410 02/26/19 0420  HGB 9.2* 8.8* 9.4*  HCT 31.8* 29.7* 30.4*  WBC 23.6* 16.9* 16.9*  PLT 121* 193 147*    COAGULATION Recent Labs  Lab 02/22/19 0745 02/23/19 0200  INR 1.4* 1.4*    CARDIAC   Recent Labs  Lab 02/23/19 0200 02/23/19 0941 02/23/19 1345 02/23/19 2000  TROPONINI 4.44* 2.65* 1.50* 1.65*   No results for input(s): PROBNP in the last 168 hours.   CHEMISTRY Recent Labs  Lab 02/22/19 0745 02/22/19 0852  02/23/19 0200  02/23/19 1558 02/24/19 0150 02/24/19 0341 02/25/19 0410 02/26/19 0420  NA 138 138  --  136  --   --  136 138 141  K 4.8 4.6  --  4.2  --   --  4.3 4.7 4.4  CL 109  --   --  112*  --   --  110 107 107  CO2 21*  --   --  18*  --   --  19* 22 25  GLUCOSE 106*  --   --  192*  --   --  162* 95 154*  BUN 28*  --   --  37*  --   --  41* 52* 51*  CREATININE 1.71*  --   --  1.83*  --   --  1.38* 1.58* 1.38*  CALCIUM 7.4*  --   --  7.4*  --   --  7.8* 8.2* 9.0  MG 1.4*  --    < > 2.3 2.5* 2.5*  --  2.4 2.1  PHOS 4.7*  --    < > 3.5 2.9 2.0*  --  3.7 4.3   < > = values in this interval not displayed.   Estimated Creatinine Clearance: 39.5 mL/min (A) (by C-G formula based on SCr of 1.38 mg/dL (H)).   LIVER Recent Labs  Lab 02/22/19 0745  02/23/19 0200  AST 90* 65*  ALT 37 31  ALKPHOS 91 96  BILITOT 0.9 0.4  PROT 5.0* 5.1*  ALBUMIN 2.4* 2.2*  INR 1.4* 1.4*     INFECTIOUS Recent Labs  Lab 02/21/19 1423 02/21/19 1453 02/23/19 0200 02/24/19 0150  LATICACIDVEN 3.9* 3.6* 1.2  --   PROCALCITON  --   --  19.26 13.27     ENDOCRINE CBG (last 3)  Recent Labs    02/26/19 0027 02/26/19 0404 02/26/19 0809  GLUCAP 124* 132* 148*         IMAGING x48h  - image(s) personally visualized  -   highlighted in bold Dg Chest Port 1 View  Result Date: 02/26/2019 CLINICAL DATA:  Acute respiratory failure.  Ventilator support. EXAM: PORTABLE CHEST 1 VIEW COMPARISON:  For a 2020 FINDINGS: Endotracheal tube tip 2.5 cm above the carina. Orogastric or nasogastric tube enters the abdomen. Right arm PICC tip in the SVC above the right atrium. Slight increased aeration in the lower lobes. Widespread patchy pulmonary infiltrates persist and show slight worsening. IMPRESSION: Better aeration in both lower lobes. However, there is also a pattern of worsening patchy airspace density within both lungs which seems to indicate worsening infiltrate. Electronically Signed   By: Nelson Chimes M.D.   On: 02/26/2019 06:19       Ventilator settings: Vent Mode: SIMV;PSV;Volume support FiO2 (%):  [30 %-35 %] 30 % Set Rate:  [15 bmp] 15 bmp Vt Set:  [400 mL] 400 mL PEEP:  [5 cmH20] 5 cmH20 Pressure Support:  [15 cmH20] 15 cmH20    Assessment and Plan: Active Problems:   Pneumonia   CAP (community acquired pneumonia)   Acute respiratory failure with hypoxemia ([place]) ASSESSMENT / PLAN:  PULMONARY A:  Baseline copd nos -FEV1 101 % 12/2016 Acute resp failure due to bacterial sepsis  Severe vent asynchrony   02/26/2019 - > does not meet criteria for SBT/Extubation in setting of Acute Respiratory Failure due to copd + delirium + vent dysnchrony   P:   Spontaneous breathing trials  No extubation 02/26/2019 BD (mdi v  neb)  CARDIAC A: nstemi ,  known CAD s/p stent RCA 05/2016 QTC long Septic shock  02/26/2019 - off [perssors/ Echo ef 55% with IW hypokiness s/p heparin IV ending 02/25/2019  P: ASA 325  Cardiology consult once acute issues resolved  RENAL A:  AKI (baseline creat 0.8mg %)  02/26/2019\ - improvimng  P:  Renal US to r/o obstructive uropathy, she did not have hydronephrosis on CT scan 3/5 Lasix 40 q 12-for negative balance to enable extubation even though creatinine increased slightly on the 4/9 after Lasix given 4/8    GASTROINTESTINAL A:   NPO  P:   ct TFs  HEMATOLOGIC A:  Anemia critical illness  P:  - PRBC for hgb <7.9    INFECTIOUS A:   E coli UTI covid RULED OUT  P:   Ct ceftriaxone    ENDOCRINE A:   At risk hyperglucemia P:   ssi  NEUROLOGIC A:   Agitated /vent asynchrony  02/26/2019 - ongoing agitated delirium  P:   fent gtt Versed int precedex gtt Goal RASS 0 restraints    Best practices / Disposition: -->Code Status: FULL -->DVT Px:HSQ -->GI Px:PPI -->Diet:  TF - family: daughter updated 4/8, 4/10 -> d/w daughter and discussed goals -> discussed no CPR. She will discuss ith patient son / her brother. Touched upoing long term issues of LTAC etc., but right now forcus on CPR discussion . She will talk to her brother/patient son and get back      [place]   The patient ARELYS GLASSCO is critically ill with multiple organ systems failure and requires high complexity decision making for assessment and support, frequent evaluation and titration of therapies, application of advanced monitoring technologies and extensive interpretation of multiple databases.   Critical Care Time devoted to patient care services described in this note is  30  Minutes. This time reflects time of care of this signee Dr Brand Males. This critical care time does not reflect procedure time, or teaching time or supervisory time of PA/NP/Med  student/Med Resident etc but could involve care discussion time     Dr. Brand Males, M.D., [facility].C.P Pulmonary and Critical Care Medicine Staff Physician [facility] Pager: [phone], If no answer or between  15:00h - 7:00h: call [phone]  02/26/2019 11:24 AM

## 2019-02-26 NOTE — Progress Notes (Signed)
FPTS Social Progress Note  Following along socially, appreciate care by CCM and will resume care when patient stable for transfer out to floor.   Marjie Skiff, MD [place], PGY-3 Service pager [phone]

## 2019-02-26 NOTE — Progress Notes (Signed)
Pt blood pressure high, 190s. Maxed on Fentanyl and Precedex. Informed CCM. Lasix order placed and PRN versed given. Pt responded well, will continue to monitor.

## 2019-02-26 NOTE — Progress Notes (Signed)
  Echocardiogram 2D Echocardiogram has been performed.  Johny Chess 02/26/2019, 9:13 AM

## 2019-02-27 ENCOUNTER — Inpatient Hospital Stay (HOSPITAL_COMMUNITY): Payer: Medicare Other

## 2019-02-27 LAB — BASIC METABOLIC PANEL
Anion gap: 7 (ref 5–15)
BUN: 47 mg/dL — ABNORMAL HIGH (ref 8–23)
CO2: 31 mmol/L (ref 22–32)
Calcium: 9.1 mg/dL (ref 8.9–10.3)
Chloride: 105 mmol/L (ref 98–111)
Creatinine, Ser: 1.09 mg/dL — ABNORMAL HIGH (ref 0.44–1.00)
GFR calc Af Amer: 58 mL/min — ABNORMAL LOW (ref >60)
GFR calc non Af Amer: 50 mL/min — ABNORMAL LOW (ref >60)
Glucose, Bld: 143 mg/dL — ABNORMAL HIGH (ref 70–99)
Potassium: 4 mmol/L (ref 3.5–5.1)
Sodium: 143 mmol/L (ref 135–145)

## 2019-02-27 LAB — POCT I-STAT 7, (LYTES, BLD GAS, ICA,H+H)
Acid-Base Excess: 3 mmol/L — ABNORMAL HIGH (ref 0.0–2.0)
Bicarbonate: 27.8 mmol/L (ref 20.0–28.0)
Calcium, Ion: 1.27 mmol/L (ref 1.15–1.40)
HCT: 24 % — ABNORMAL LOW (ref 36.0–46.0)
Hemoglobin: 8.2 g/dL — ABNORMAL LOW (ref 12.0–15.0)
O2 Saturation: 91 %
Patient temperature: 100.6
Potassium: 3.9 mmol/L (ref 3.5–5.1)
Sodium: 144 mmol/L (ref 135–145)
TCO2: 29 mmol/L (ref 22–32)
pCO2 arterial: 44.7 mmHg (ref 32.0–48.0)
pH, Arterial: 7.406 (ref 7.350–7.450)
pO2, Arterial: 66 mmHg — ABNORMAL LOW (ref 83.0–108.0)

## 2019-02-27 LAB — GLUCOSE, CAPILLARY
Glucose-Capillary: 143 mg/dL — ABNORMAL HIGH (ref 70–99)
Glucose-Capillary: 123 mg/dL — ABNORMAL HIGH (ref 70–99)
Glucose-Capillary: 160 mg/dL — ABNORMAL HIGH (ref 70–99)
Glucose-Capillary: 166 mg/dL — ABNORMAL HIGH (ref 70–99)
Glucose-Capillary: 153 mg/dL — ABNORMAL HIGH (ref 70–99)

## 2019-02-27 LAB — CBC WITH DIFFERENTIAL/PLATELET
Abs Immature Granulocytes: 0.73 10*3/uL — ABNORMAL HIGH (ref 0.00–0.07)
Basophils Absolute: 0.1 10*3/uL (ref 0.0–0.1)
Basophils Relative: 0 %
Eosinophils Absolute: 0 10*3/uL (ref 0.0–0.5)
Eosinophils Relative: 0 %
HCT: 28.9 % — ABNORMAL LOW (ref 36.0–46.0)
Hemoglobin: 8.8 g/dL — ABNORMAL LOW (ref 12.0–15.0)
Immature Granulocytes: 4 %
Lymphocytes Relative: 9 %
Lymphs Abs: 1.7 10*3/uL (ref 0.7–4.0)
MCH: 25.7 pg — ABNORMAL LOW (ref 26.0–34.0)
MCHC: 30.4 g/dL (ref 30.0–36.0)
MCV: 84.5 fL (ref 80.0–100.0)
Monocytes Absolute: 1.8 10*3/uL — ABNORMAL HIGH (ref 0.1–1.0)
Monocytes Relative: 10 %
Neutro Abs: 14.6 10*3/uL — ABNORMAL HIGH (ref 1.7–7.7)
Neutrophils Relative %: 77 %
Platelets: ADEQUATE 10*3/uL (ref 150–400)
RBC: 3.42 MIL/uL — ABNORMAL LOW (ref 3.87–5.11)
RDW: 17.3 % — ABNORMAL HIGH (ref 11.5–15.5)
WBC: 18.9 10*3/uL — ABNORMAL HIGH (ref 4.0–10.5)
nRBC: 0.1 % (ref 0.0–0.2)

## 2019-02-27 LAB — HEPATIC FUNCTION PANEL
ALT: 30 U/L (ref 0–44)
AST: 40 U/L (ref 15–41)
Albumin: 2.1 g/dL — ABNORMAL LOW (ref 3.5–5.0)
Alkaline Phosphatase: 170 U/L — ABNORMAL HIGH (ref 38–126)
Bilirubin, Direct: 0.2 mg/dL (ref 0.0–0.2)
Indirect Bilirubin: 0.1 mg/dL — ABNORMAL LOW (ref 0.3–0.9)
Total Bilirubin: 0.3 mg/dL (ref 0.3–1.2)
Total Protein: 5.9 g/dL — ABNORMAL LOW (ref 6.5–8.1)

## 2019-02-27 LAB — PROTIME-INR
INR: 1.3 — ABNORMAL HIGH (ref 0.8–1.2)
Prothrombin Time: 16 seconds — ABNORMAL HIGH (ref 11.4–15.2)

## 2019-02-27 LAB — MAGNESIUM: Magnesium: 1 mg/dL — ABNORMAL LOW (ref 1.7–2.4)

## 2019-02-27 MED ORDER — POLYVINYL ALCOHOL 1.4 % OP SOLN
1.0000 [drp] | OPHTHALMIC | Status: DC | PRN
  Administered 2019-02-27 – 2019-02-28 (×4): 1 [drp] via OPHTHALMIC
  Filled 2019-02-27: qty 15

## 2019-02-27 MED ORDER — MIDAZOLAM HCL 2 MG/2ML IJ SOLN
1.0000 mg | INTRAMUSCULAR | Status: DC | PRN
  Administered 2019-02-27: 4 mg via INTRAVENOUS
  Administered 2019-02-27 (×4): 2 mg via INTRAVENOUS
  Administered 2019-02-28 (×2): 4 mg via INTRAVENOUS
  Administered 2019-03-02: 04:00:00 2 mg via INTRAVENOUS
  Filled 2019-02-27: qty 4
  Filled 2019-02-27 (×2): qty 2
  Filled 2019-02-27: qty 4
  Filled 2019-02-27 (×2): qty 2
  Filled 2019-02-27 (×2): qty 4
  Filled 2019-02-27 (×3): qty 2

## 2019-02-27 MED ORDER — HYDROMORPHONE HCL 1 MG/ML IJ SOLN
0.5000 mg | INTRAMUSCULAR | Status: DC | PRN
  Administered 2019-02-27: 2 mg via INTRAVENOUS
  Administered 2019-02-27: 1 mg via INTRAVENOUS
  Administered 2019-02-27: 0.5 mg via INTRAVENOUS
  Administered 2019-02-27: 1 mg via INTRAVENOUS
  Administered 2019-02-28 (×2): 2 mg via INTRAVENOUS
  Filled 2019-02-27 (×2): qty 1
  Filled 2019-02-27 (×3): qty 2
  Filled 2019-02-27: qty 1

## 2019-02-27 MED ORDER — CLONIDINE HCL 0.2 MG PO TABS
0.2000 mg | ORAL_TABLET | Freq: Four times a day (QID) | ORAL | Status: DC
  Administered 2019-02-27 – 2019-02-28 (×3): 0.2 mg via ORAL
  Filled 2019-02-27 (×3): qty 1

## 2019-02-27 MED ORDER — CITALOPRAM HYDROBROMIDE 10 MG/5ML PO SOLN
40.0000 mg | Freq: Every day | ORAL | Status: DC
  Administered 2019-02-27 – 2019-03-03 (×5): 40 mg
  Filled 2019-02-27 (×6): qty 20

## 2019-02-27 MED ORDER — DILTIAZEM 12 MG/ML ORAL SUSPENSION
30.0000 mg | Freq: Three times a day (TID) | ORAL | Status: DC
  Administered 2019-02-27 – 2019-03-03 (×9): 30 mg
  Filled 2019-02-27 (×13): qty 3

## 2019-02-27 MED ORDER — HALOPERIDOL LACTATE 5 MG/ML IJ SOLN
5.0000 mg | Freq: Four times a day (QID) | INTRAMUSCULAR | Status: DC | PRN
  Administered 2019-02-27 – 2019-02-28 (×2): 5 mg via INTRAVENOUS
  Filled 2019-02-27 (×2): qty 1

## 2019-02-27 MED ORDER — MAGNESIUM SULFATE 4 GM/100ML IV SOLN
4.0000 g | INTRAVENOUS | Status: AC
  Administered 2019-02-27 (×2): 4 g via INTRAVENOUS
  Filled 2019-02-27 (×2): qty 100

## 2019-02-27 MED ORDER — FUROSEMIDE 10 MG/ML IJ SOLN
80.0000 mg | Freq: Three times a day (TID) | INTRAMUSCULAR | Status: DC
  Administered 2019-02-27 – 2019-03-01 (×7): 80 mg via INTRAVENOUS
  Filled 2019-02-27 (×7): qty 8

## 2019-02-27 MED ORDER — TRAZODONE HCL 50 MG PO TABS
50.0000 mg | ORAL_TABLET | Freq: Every day | ORAL | Status: DC
  Administered 2019-02-27 – 2019-03-02 (×4): 50 mg
  Filled 2019-02-27 (×4): qty 1

## 2019-02-27 MED ORDER — HALOPERIDOL LACTATE 5 MG/ML IJ SOLN
5.0000 mg | INTRAMUSCULAR | Status: AC
  Administered 2019-02-27: 10:00:00 5 mg via INTRAVENOUS
  Filled 2019-02-27: qty 1

## 2019-02-27 MED ORDER — MIDAZOLAM HCL 2 MG/2ML IJ SOLN
INTRAMUSCULAR | Status: AC
  Administered 2019-02-27: 10:00:00 4 mg via INTRAVENOUS
  Filled 2019-02-27: qty 4

## 2019-02-27 NOTE — Progress Notes (Signed)
Pt very agitated in morning, BP systolic in 124P and HR in 110s, 02 sats consitently   in mid 80s. Gave Versed and Fentanyl bolus PRN but was only a short term resolution, maxed out on Fentanyl and Precedx drips. ETT suction showed new onset pink/red tinged sputum. CCM and RT notified. Fi02 increased from 35% to 70%. New verbal sedation orders given (4mg  Haldol, Stat 5mg  Hadol). Orders for  Lasix, Cardizem, Catapres, Celexa placed. Pt much more stable, will continue to monitor.

## 2019-02-27 NOTE — Progress Notes (Signed)
RT NOTE:  Called by RN at assess pt. Pt was saturating in the low 80s. Suctioned pt and brought up thick, bloody secretions. FI02 was increased to 50%. Pt is using accessory muscles. Pt's sats are now stable in the lower 90s. MD informed.

## 2019-02-27 NOTE — Progress Notes (Signed)
ABG obtained on patient due to patient's respiratory effort.  Sample obtained on ventilator settings of SIMV/VC/PSV of tidal volume of 400, respiratory rate of 15, PEEP of 5, FIO2 of 50% and pressure support of 15.  Increased FIO2 to 70%.  No other changes at this time.  Will continue to monitor.    Ref. Range 02/27/2019 10:52  Sample type Unknown ARTERIAL  pH, Arterial Latest Ref Range: 7.350 - 7.450  7.406  pCO2 arterial Latest Ref Range: 32.0 - 48.0 mmHg 44.7  pO2, Arterial Latest Ref Range: 83.0 - 108.0 mmHg 66.0 (L)  TCO2 Latest Ref Range: 22 - 32 mmol/L 29  Acid-Base Excess Latest Ref Range: 0.0 - 2.0 mmol/L 3.0 (H)  Bicarbonate Latest Ref Range: 20.0 - 28.0 mmol/L 27.8  O2 Saturation Latest Units: % 91.0  Patient temperature Unknown 100.6 F  Collection site Unknown RADIAL, ALLEN'S TEST ACCEPTABLE

## 2019-02-27 NOTE — Progress Notes (Signed)
FPTS Social Progress Note  Following along socially, appreciate care by CCM and will resume care when patient stable for transfer out to floor.   Marjie Skiff, MD [place], PGY-3 Service pager [phone]

## 2019-02-27 NOTE — Progress Notes (Signed)
Patient still continued to use accessory muscles while on ventilator.  Also dyssynchronous with ventilator.  Attempted to trial patient on pressure control ventilation and decreased respiratory rate to 10 to allow for increased tidal volume.  Patient now more comfortable with ventilator.  Informed MD of changes made.  Will continue to monitor.

## 2019-02-27 NOTE — Progress Notes (Signed)
Name: Martha Mora MRN: [id number] DOB: December 11, 1945 CC: SOB   LOS: 5  [facility] / Critical Care Note   73 yo adm 4/5  with worsening shortness of breath, fevers, chills and cough as well as foul smelling urine. She continues to smoke E-cigs and has a 80 PPD hx of smoking.   She was admitted to the medical floor on 2 L  , CCM called as patient became tachypneic and was requiring escalating amount of oxygen on 11 L NRB. Tx to ICU and intubated. Found to have E. coli septic shock CO VID ruled out   Lines / Drains:  RUE PICC 4/6  ETT 4/6 >>  Cultures:  BC 4/5 e coli  Urine 4/5 >> e coli   Antibiotics:  rocephin 4/6 >>   EVENTS   CT abdomen 3/5 >>> 11 mm stone left kidey, rt punctate stone, no hnosis US renal   4/8 vent asynchrony  4/9 -  Urine output increased with Lasix, back down on second shift Afebrile Remains critically ill, intubated on low-dose Levophed Severe vent asynchrony on 4/8 requiring increased sedation  4/10 - desaturated on vent at fio2 30%. Seems dysnch with vent. On fent gtt and precedex gtt  SUBJECTIVE/OVERNIGHT/INTERVAL HX    02/27/2019 0 - on sbt. Hypertensive, agitated despite fent gtt and precedex gtt. Diursed with test dose lasix but still +9L positive since admit. Worsning scleral edema   Vital Signs: Temp:  [98.7 F (37.1 C)-101.9 F (38.8 C)] 100.9 F (38.3 C) (04/11 0800) Pulse Rate:  [57-112] 110 (04/11 0800) Resp:  [14-27] 24 (04/11 0800) BP: (152-205)/(55-101) 205/83 (04/11 0800) SpO2:  [90 %-99 %] 90 % (04/11 0800) FiO2 (%):  [35 %-40 %] 35 % (04/11 0800) Weight:  [109.8 kg] 109.8 kg (04/11 0408) I/O last 3 completed shifts: In: 3382.1 [I.V.:1905.5; NG/GT:1320; IV Piggyback:156.6] Out: 2202 [RKYHC:6237; Emesis/NG output:85]    General Appearance:  Looks criticall ill OBESE - + Head:  Normocephalic, without obvious abnormality, atraumatic Eyes:  PERRL - yes, conjunctiva/corneas - scleral edema ++3     Ears:   Normal external ear canals, both ears Nose:  G tube - no Throat:  ETT TUBE - no , OG tube - no Neck:  Supple,  No enlargement/tenderness/nodules Lungs: Clear to auscultation bilaterally, Ventilator   Synchrony - no Heart:  S1 and S2 normal, no murmur, CVP - no.  Pressors - no Abdomen:  Soft, no masses, no organomegaly Genitalia / Rectal:  Not done Extremities:  Extremities- intact, edema + Skin:  ntact in exposed areas . Sacral area - not examined Neurologic:  Sedation - feng gt, percedexg gt -> RASS - +2 . Moves all 4s - eys. CAM-ICU - + . Orientation - not oreinted        LABS    PULMONARY Recent Labs  Lab 02/22/19 0155 02/22/19 0337 02/22/19 0852 02/22/19 2040 02/22/19 2305  PHART 7.350 6.996* 7.254* 7.221* 7.327*  PCO2ART 33.5 96.0* 43.4 47.0 31.5*  PO2ART 110* 203.0* 99.0 87.8 153*  HCO3 18.0* 23.4 19.2* 18.6* 16.0*  TCO2  --  26 20*  --   --   O2SAT 97.1 99.0 96.0 95.4 98.6    CBC Recent Labs  Lab 02/25/19 0410 02/26/19 0420 02/27/19 0236  HGB 8.8* 9.4* 8.8*  HCT 29.7* 30.4* 28.9*  WBC 16.9* 16.9* 18.9*  PLT 193 147* PLATELET CLUMPS NOTED ON SMEAR, COUNT APPEARS ADEQUATE    COAGULATION Recent Labs  Lab 02/22/19 0745 02/23/19 0200 02/27/19  0236  INR 1.4* 1.4* 1.3*    CARDIAC   Recent Labs  Lab 02/23/19 0200 02/23/19 0941 02/23/19 1345 02/23/19 2000  TROPONINI 4.44* 2.65* 1.50* 1.65*   No results for input(s): PROBNP in the last 168 hours.   CHEMISTRY Recent Labs  Lab 02/23/19 0200 02/23/19 1558 02/24/19 0150 02/24/19 0341 02/25/19 0410 02/26/19 0420 02/26/19 2342 02/27/19 0236  NA 136  --   --  136 138 141 143  --   K 4.2  --   --  4.3 4.7 4.4 4.0  --   CL 112*  --   --  110 107 107 105  --   CO2 18*  --   --  19* 22 25 31   --   GLUCOSE 192*  --   --  162* 95 154* 143*  --   BUN 37*  --   --  41* 52* 51* 47*  --   CREATININE 1.83*  --   --  1.38* 1.58* 1.38* 1.09*  --   CALCIUM 7.4*  --   --  7.8* 8.2* 9.0 9.1  --   MG 2.3  2.5* 2.5*  --  2.4 2.1  --  1.0*  PHOS 3.5 2.9 2.0*  --  3.7 4.3  --   --    Estimated Creatinine Clearance: 49.1 mL/min (A) (by C-G formula based on SCr of 1.09 mg/dL (H)).   LIVER Recent Labs  Lab 02/22/19 0745 02/23/19 0200 02/27/19 0236  AST 90* 65* 40  ALT 37 31 30  ALKPHOS 91 96 170*  BILITOT 0.9 0.4 0.3  PROT 5.0* 5.1* 5.9*  ALBUMIN 2.4* 2.2* 2.1*  INR 1.4* 1.4* 1.3*     INFECTIOUS Recent Labs  Lab 02/21/19 1423 02/21/19 1453 02/23/19 0200 02/24/19 0150  LATICACIDVEN 3.9* 3.6* 1.2  --   PROCALCITON  --   --  19.26 13.27     ENDOCRINE CBG (last 3)  Recent Labs    02/26/19 2320 02/27/19 0526 02/27/19 0839  GLUCAP 142* 143* 123*         IMAGING x48h  - image(s) personally visualized  -   highlighted in bold US Renal  Result Date: 02/27/2019 CLINICAL DATA:  Initial evaluation for acute renal failure, UTI. EXAM: RENAL / URINARY TRACT ULTRASOUND COMPLETE COMPARISON:  Prior CT from 01/21/2019 FINDINGS: Right Kidney: Renal measurements: 10.4 x 5.5 x 6.5 cm. Echogenicity within normal limits. No mass or hydronephrosis visualized. Left Kidney: Not visualized. Bladder: Bladder decompressed with a Foley catheter in place. IMPRESSION: 1. Technically limited exam due to body habitus and patient's inability to cooperate with the study. 2. Normal right kidney without hydronephrosis. 3. Nonvisualization of the left kidney. Electronically Signed   By: Jeannine Boga M.D.   On: 02/27/2019 07:37   Dg Chest Port 1 View  Result Date: 02/27/2019 CLINICAL DATA:  Intubated EXAM: PORTABLE CHEST 1 VIEW COMPARISON:  Chest radiograph from one day prior. FINDINGS: Endotracheal tube tip is 3.2 cm above the carina. Enteric tube enters stomach with the tip not seen on this image. Right PICC terminates in middle third of the SVC. Surgical clips overlie the right axilla. Stable cardiomediastinal silhouette with mild to moderate cardiomegaly. No pneumothorax. No pleural effusion.  Diffuse hazy parahilar lung opacities appears slightly improved. IMPRESSION: 1. Well-positioned support structures. 2. Stable cardiomegaly. 3. Slight improvement in diffuse hazy parahilar lung opacities, suggesting improving pulmonary edema. Electronically Signed   By: Ilona Sorrel M.D.   On: 02/27/2019 07:58  Dg Chest Port 1 View  Result Date: 02/26/2019 CLINICAL DATA:  Acute respiratory failure.  Ventilator support. EXAM: PORTABLE CHEST 1 VIEW COMPARISON:  For a 2020 FINDINGS: Endotracheal tube tip 2.5 cm above the carina. Orogastric or nasogastric tube enters the abdomen. Right arm PICC tip in the SVC above the right atrium. Slight increased aeration in the lower lobes. Widespread patchy pulmonary infiltrates persist and show slight worsening. IMPRESSION: Better aeration in both lower lobes. However, there is also a pattern of worsening patchy airspace density within both lungs which seems to indicate worsening infiltrate. Electronically Signed   By: Nelson Chimes M.D.   On: 02/26/2019 06:19       Ventilator settings: Vent Mode: SIMV;PSV;Other (Comment) FiO2 (%):  [35 %-40 %] 35 % Set Rate:  [15 bmp] 15 bmp Vt Set:  [400 mL] 400 mL PEEP:  [5 cmH20] 5 cmH20 Pressure Support:  [15 cmH20] 15 cmH20    Assessment and Plan: Active Problems:   Pneumonia   CAP (community acquired pneumonia)   Acute respiratory failure with hypoxemia ([place]) ASSESSMENT / PLAN:  PULMONARY A:  Baseline copd nos -FEV1 101 % 12/2016 Acute resp failure due to bacterial sepsis  Severe vent asynchrony  02/27/2019 - > does not meet criteria for Extubation in setting of Acute Respiratory Failure due to - agitation   P:   Spontaneous breathing trials  No extubation 02/27/2019 BD (mdi v neb)  CARDIAC A: nstemi , known CAD s/p stent RCA 05/2016 QTC long Hypertensive at home - on norvasc, coreg,   Septic shock   02/27/2019 - off [perssors/ Echo ef 55% with IW hypokiness s/p heparin IV ending 02/25/2019. QTc 4/9  ->461 msec. Hypertensive  -on norvasc, coreg, and losartan  P: ASA 325  Lasix increase Start cardizem for bp Cardiology consult once acute issues resolved   RENAL A:  AKI (baseline creat 0.8mg %). rEnal US ok  02/27/2019\ - improving, diuresing. Still volume overloaded +9L  P:  Lasix 80mg  IV Q8h   GASTROINTESTINAL A:   NPO  P:   ct TFs  HEMATOLOGIC A:  Anemia critical illness  P:  - PRBC for hgb </= 6.9gm%    - exceptions are   -  if ACS susepcted/confirmed then transfuse for hgb </= 8.0gm%,  or    -  active bleeding with hemodynamic instability, then transfuse regardless of hemoglobin value   At at all times try to transfuse 1 unit prbc as possible with exception of active hemorrhage      INFECTIOUS A:   E coli UTI covid RULED OUT  P:   Ct ceftriaxone    ENDOCRINE A:   At risk hyperglucemia P:   ssi  NEUROLOGIC A:   Agitated /vent asynchrony  02/27/2019 - ongoing agitated delirium and worse delirium  P:   fent gtt precedex gtt + add clonidine Increase versed prn Start dilaudid prn Stat haldol and then prn (do EKG 02/28/19) Start home celexa (d/w daughter) Start home trazodone (d/w daughter)      Holiday representative / Disposition: -->Code Status: FULL -->DVT Px:HSQ -->GI Px:PPI -->Diet:  TF - family: daughter updated 4/8,    - 4/10 -> d/w daughter and discussed goals -> discussed no CPR. She will discuss ith patient son / her brother. Touched upoing long term issues of LTAC etc., but right now forcus on CPR discussion . She will talk to her brother/patient son and get back  4/11 - per daughter full code unless irreversible  -  ATTESTATION & SIGNATURE   The patient Martha Mora is critically ill with multiple organ systems failure and requires high complexity decision making for assessment and support, frequent evaluation and titration of therapies, application of advanced monitoring technologies and extensive interpretation of  multiple databases.   Critical Care Time devoted to patient care services described in this note is  30  Minutes. This time reflects time of care of this signee Dr Brand Males. This critical care time does not reflect procedure time, or teaching time or supervisory time of PA/NP/Med student/Med Resident etc but could involve care discussion time     Dr. Brand Males, M.D., [facility].C.P Pulmonary and Critical Care Medicine Staff Physician [facility] Pager: [phone], If no answer or between  15:00h - 7:00h: call [phone]  02/27/2019 9:36 AM

## 2019-02-27 NOTE — Progress Notes (Signed)
RT note: attempted patient on CPAP/PSV of 15/5 at 0745.  Patient noted to have excessive accessory muscle use.  Placed patient back on full support ventilation.  Currently tolerating well.  Patient also noted to have red, bloody, secretions when suctioned from ETT.  RN aware as well.  Will notify MD.

## 2019-02-27 NOTE — Progress Notes (Signed)
elink video call provided to daughter

## 2019-02-28 ENCOUNTER — Inpatient Hospital Stay (HOSPITAL_COMMUNITY): Payer: Medicare Other

## 2019-02-28 LAB — CBC WITH DIFFERENTIAL/PLATELET
Abs Immature Granulocytes: 0.79 10*3/uL — ABNORMAL HIGH (ref 0.00–0.07)
Basophils Absolute: 0.1 10*3/uL (ref 0.0–0.1)
Basophils Relative: 0 %
Eosinophils Absolute: 0.1 10*3/uL (ref 0.0–0.5)
Eosinophils Relative: 0 %
HCT: 27.5 % — ABNORMAL LOW (ref 36.0–46.0)
Hemoglobin: 8.6 g/dL — ABNORMAL LOW (ref 12.0–15.0)
Immature Granulocytes: 4 %
Lymphocytes Relative: 12 %
Lymphs Abs: 2.5 10*3/uL (ref 0.7–4.0)
MCH: 26.8 pg (ref 26.0–34.0)
MCHC: 31.3 g/dL (ref 30.0–36.0)
MCV: 85.7 fL (ref 80.0–100.0)
Monocytes Absolute: 1.9 10*3/uL — ABNORMAL HIGH (ref 0.1–1.0)
Monocytes Relative: 9 %
Neutro Abs: 15 10*3/uL — ABNORMAL HIGH (ref 1.7–7.7)
Neutrophils Relative %: 75 %
Platelets: 282 10*3/uL (ref 150–400)
RBC: 3.21 MIL/uL — ABNORMAL LOW (ref 3.87–5.11)
RDW: 17.2 % — ABNORMAL HIGH (ref 11.5–15.5)
WBC: 20.4 10*3/uL — ABNORMAL HIGH (ref 4.0–10.5)
nRBC: 0.3 % — ABNORMAL HIGH (ref 0.0–0.2)

## 2019-02-28 LAB — HEPATIC FUNCTION PANEL
ALT: 33 U/L (ref 0–44)
AST: 51 U/L — ABNORMAL HIGH (ref 15–41)
Albumin: 2.1 g/dL — ABNORMAL LOW (ref 3.5–5.0)
Alkaline Phosphatase: 129 U/L — ABNORMAL HIGH (ref 38–126)
Bilirubin, Direct: 0.1 mg/dL (ref 0.0–0.2)
Indirect Bilirubin: 0.3 mg/dL (ref 0.3–0.9)
Total Bilirubin: 0.4 mg/dL (ref 0.3–1.2)
Total Protein: 5.6 g/dL — ABNORMAL LOW (ref 6.5–8.1)

## 2019-02-28 LAB — BASIC METABOLIC PANEL
Anion gap: 14 (ref 5–15)
Anion gap: 15 (ref 5–15)
BUN: 36 mg/dL — ABNORMAL HIGH (ref 8–23)
BUN: 37 mg/dL — ABNORMAL HIGH (ref 8–23)
CO2: 34 mmol/L — ABNORMAL HIGH (ref 22–32)
CO2: 33 mmol/L — ABNORMAL HIGH (ref 22–32)
Calcium: 8.8 mg/dL — ABNORMAL LOW (ref 8.9–10.3)
Calcium: 8.9 mg/dL (ref 8.9–10.3)
Chloride: 97 mmol/L — ABNORMAL LOW (ref 98–111)
Chloride: 100 mmol/L (ref 98–111)
Creatinine, Ser: 0.99 mg/dL (ref 0.44–1.00)
Creatinine, Ser: 1.06 mg/dL — ABNORMAL HIGH (ref 0.44–1.00)
GFR calc Af Amer: >60 mL/min (ref >60)
GFR calc Af Amer: 59 mL/min — ABNORMAL LOW (ref >60)
GFR calc non Af Amer: 56 mL/min — ABNORMAL LOW (ref >60)
GFR calc non Af Amer: 51 mL/min — ABNORMAL LOW (ref >60)
Glucose, Bld: 153 mg/dL — ABNORMAL HIGH (ref 70–99)
Glucose, Bld: 164 mg/dL — ABNORMAL HIGH (ref 70–99)
Potassium: 2.8 mmol/L — ABNORMAL LOW (ref 3.5–5.1)
Potassium: 3.4 mmol/L — ABNORMAL LOW (ref 3.5–5.1)
Sodium: 145 mmol/L (ref 135–145)
Sodium: 148 mmol/L — ABNORMAL HIGH (ref 135–145)

## 2019-02-28 LAB — GLUCOSE, CAPILLARY
Glucose-Capillary: 137 mg/dL — ABNORMAL HIGH (ref 70–99)
Glucose-Capillary: 141 mg/dL — ABNORMAL HIGH (ref 70–99)
Glucose-Capillary: 149 mg/dL — ABNORMAL HIGH (ref 70–99)
Glucose-Capillary: 150 mg/dL — ABNORMAL HIGH (ref 70–99)
Glucose-Capillary: 153 mg/dL — ABNORMAL HIGH (ref 70–99)
Glucose-Capillary: 156 mg/dL — ABNORMAL HIGH (ref 70–99)

## 2019-02-28 LAB — LACTIC ACID, PLASMA: Lactic Acid, Venous: 0.8 mmol/L (ref 0.5–1.9)

## 2019-02-28 LAB — MAGNESIUM: Magnesium: 3.5 mg/dL — ABNORMAL HIGH (ref 1.7–2.4)

## 2019-02-28 LAB — TRIGLYCERIDES: Triglycerides: 148 mg/dL

## 2019-02-28 LAB — PROCALCITONIN: Procalcitonin: 1.03 ng/mL

## 2019-02-28 MED ORDER — POTASSIUM CHLORIDE 20 MEQ/15ML (10%) PO SOLN
40.0000 meq | ORAL | Status: AC
  Administered 2019-02-28 (×2): 40 meq
  Filled 2019-02-28 (×2): qty 30

## 2019-02-28 MED ORDER — PIPERACILLIN-TAZOBACTAM 3.375 G IVPB
3.3750 g | Freq: Three times a day (TID) | INTRAVENOUS | Status: DC
  Administered 2019-02-28 – 2019-03-04 (×12): 3.375 g via INTRAVENOUS
  Filled 2019-02-28 (×14): qty 50

## 2019-02-28 MED ORDER — HYDROMORPHONE BOLUS VIA INFUSION
0.2000 mg | INTRAVENOUS | Status: DC | PRN
  Administered 2019-03-02 (×2): 0.2 mg via INTRAVENOUS
  Filled 2019-02-28: qty 1

## 2019-02-28 MED ORDER — SENNOSIDES-DOCUSATE SODIUM 8.6-50 MG PO TABS
1.0000 | ORAL_TABLET | Freq: Two times a day (BID) | ORAL | Status: DC
  Administered 2019-02-28 – 2019-03-06 (×8): 1 via ORAL
  Filled 2019-02-28 (×8): qty 1

## 2019-02-28 MED ORDER — NICOTINE 21 MG/24HR TD PT24
21.0000 mg | MEDICATED_PATCH | Freq: Every day | TRANSDERMAL | Status: DC
  Administered 2019-02-28 – 2019-03-10 (×9): 21 mg via TRANSDERMAL
  Filled 2019-02-28 (×10): qty 1

## 2019-02-28 MED ORDER — VANCOMYCIN HCL 10 G IV SOLR
1250.0000 mg | INTRAVENOUS | Status: DC
  Administered 2019-03-02 – 2019-03-03 (×2): 1250 mg via INTRAVENOUS
  Filled 2019-02-28 (×3): qty 1250

## 2019-02-28 MED ORDER — PROPOFOL 1000 MG/100ML IV EMUL
5.0000 ug/kg/min | INTRAVENOUS | Status: DC
  Administered 2019-02-28: 20 ug/kg/min via INTRAVENOUS
  Administered 2019-02-28: 5 ug/kg/min via INTRAVENOUS
  Administered 2019-03-01 (×3): 20 ug/kg/min via INTRAVENOUS
  Administered 2019-03-02 (×2): 15 ug/kg/min via INTRAVENOUS
  Administered 2019-03-03: 04:00:00 10 ug/kg/min via INTRAVENOUS
  Administered 2019-03-03: 40 ug/kg/min via INTRAVENOUS
  Filled 2019-02-28 (×8): qty 100

## 2019-02-28 MED ORDER — CLONIDINE HCL 0.3 MG PO TABS
0.3000 mg | ORAL_TABLET | Freq: Four times a day (QID) | ORAL | Status: DC
  Administered 2019-02-28 – 2019-03-01 (×5): 0.3 mg via ORAL
  Filled 2019-02-28 (×5): qty 1

## 2019-02-28 MED ORDER — VANCOMYCIN HCL 10 G IV SOLR
2000.0000 mg | Freq: Once | INTRAVENOUS | Status: AC
  Administered 2019-02-28: 15:00:00 2000 mg via INTRAVENOUS
  Filled 2019-02-28: qty 2000

## 2019-02-28 MED ORDER — MAGNESIUM SULFATE 4 GM/100ML IV SOLN
4.0000 g | Freq: Once | INTRAVENOUS | Status: AC
  Administered 2019-02-28: 4 g via INTRAVENOUS
  Filled 2019-02-28: qty 100

## 2019-02-28 MED ORDER — SODIUM CHLORIDE 0.9 % IV SOLN
0.5000 mg/h | INTRAVENOUS | Status: DC
  Administered 2019-02-28: 0.5 mg/h via INTRAVENOUS
  Administered 2019-03-02: 2 mg/h via INTRAVENOUS
  Filled 2019-02-28 (×2): qty 5

## 2019-02-28 NOTE — Progress Notes (Signed)
FPTS Social Progress Note  Following along socially, appreciate care by CCM and will resume care when patient stable for transfer out to floor.   Lucila Maine, DO PGY-3, [facility] 02/28/2019 8:22 AM - Please contact intern pager [phone] as needed

## 2019-02-28 NOTE — Progress Notes (Signed)
Sputum sample obtained at 5789 and sent down to main lab without complications.

## 2019-02-28 NOTE — Progress Notes (Signed)
Assisted tele visit to patient with daughter.  Vanna Sailer Ann, RN  

## 2019-02-28 NOTE — Progress Notes (Signed)
RT note: patient placed on CPAP/PSV of 10/5 at 0735.  Currently tolerating well.  Will continue to monitor.

## 2019-02-28 NOTE — Progress Notes (Addendum)
Name: Martha Mora MRN: [id number] DOB: Apr 25, 1946 CC: SOB   LOS: 45  [name] / Critical Care Note   73 yo adm 4/5  with worsening shortness of breath, fevers, chills and cough as well as foul smelling urine. She continues to smoke E-cigs and has a 80 PPD hx of smoking.   She was admitted to the medical floor on 2 L [place] , CCM called as patient became tachypneic and was requiring escalating amount of oxygen on 11 L NRB. Tx to ICU and intubated. Found to have E. coli septic shock CO VID ruled out   Lines / Drains:  RUE PICC 4/6  ETT 4/6 >>  Cultures:  BC 4/5 e coli  Urine 4/5 >> e coli ////// Blood 4/12 Urine 4/12 Trach 4212  Antibiotics:  rocephin 4/6 >>4/12    EVENTS   CT abdomen 3/5 >>> 11 mm stone left kidey, rt punctate stone, no hnosis US renal   4/8 vent asynchrony  4/9 -  Urine output increased with Lasix, back down on second shift Afebrile Remains critically ill, intubated on low-dose Levophed Severe vent asynchrony on 4/8 requiring increased sedation  4/10 - desaturated on vent at fio2 30%. Seems dysnch with vent. On fent gtt and precedex gtt  on sbt. Hypertensive, agitated despite fent gtt and precedex gtt. Diursed with test dose lasix but still +9L positive since admit. Worsning scleral edema   SUBJECTIVE/OVERNIGHT/INTERVAL HX    02/28/2019 0 - still agitated despite med changes yesterday. Continues with fever up and down. Hypretensive and constipated. On fent gtt + precedex gtt + haldol + versed prn + other home meds  Vital Signs: Temp:  [98.8 F (37.1 C)-101.6 F (38.7 C)] 101.5 F (38.6 C) (04/12 1241) Pulse Rate:  [50-83] 55 (04/12 1200) Resp:  [11-19] 14 (04/12 1200) BP: (109-185)/(47-134) 163/57 (04/12 1200) SpO2:  [88 %-99 %] 93 % (04/12 1200) FiO2 (%):  [50 %-70 %] 50 % (04/12 1126) Weight:  [106.4 kg] 106.4 kg (04/12 0500) I/O last 3 completed shifts: In: 4085.1 [I.V.:2315.2; NG/GT:1360; IV Piggyback:409.9] Out: 8130  [Urine:7970; Emesis/NG output:160]   General Appearance:  Looks criticall ill OBESE - yes Head:  Normocephalic, without obvious abnormality, atraumatic Eyes:  PERRL - yes, conjunctiva/corneas - slceral edema improved     Ears:  Normal external ear canals, both ears Nose:  G tube - no Throat:  ETT TUBE - yes , OG tube - yes Neck:  Supple,  No enlargement/tenderness/nodules Lungs: Clear to auscultation bilaterally, Ventilator   Synchrony - no on a constant bassi Heart:  S1 and S2 normal, no murmur, CVP - no.  Pressors - no Abdomen:  Soft, no masses, no organomegaly Genitalia / Rectal:  Not done Extremities:  Extremities- intact but edema but edema better Skin:  ntact in exposed areas . Sacral area - not examined Neurologic:  Sedation - fent gtt and precedex gtt -> RASS - -2 to +2/+3 . Moves all 4s - yes. CAM-ICU - +ve . Orientation - not oriented           LABS    PULMONARY Recent Labs  Lab 02/22/19 0337 02/22/19 0852 02/22/19 2040 02/22/19 2305 02/27/19 1052  PHART 6.996* 7.254* 7.221* 7.327* 7.406  PCO2ART 96.0* 43.4 47.0 31.5* 44.7  PO2ART 203.0* 99.0 87.8 153* 66.0*  HCO3 23.4 19.2* 18.6* 16.0* 27.8  TCO2 26 20*  --   --  29  O2SAT 99.0 96.0 95.4 98.6 91.0    CBC Recent Labs  Lab 02/26/19 0420 02/27/19 0236 02/27/19 1052 02/28/19 0500  HGB 9.4* 8.8* 8.2* 8.6*  HCT 30.4* 28.9* 24.0* 27.5*  WBC 16.9* 18.9*  --  20.4*  PLT 147* PLATELET CLUMPS NOTED ON SMEAR, COUNT APPEARS ADEQUATE  --  282    COAGULATION Recent Labs  Lab 02/22/19 0745 02/23/19 0200 02/27/19 0236  INR 1.4* 1.4* 1.3*    CARDIAC   Recent Labs  Lab 02/23/19 0200 02/23/19 0941 02/23/19 1345 02/23/19 2000  TROPONINI 4.44* 2.65* 1.50* 1.65*   No results for input(s): PROBNP in the last 168 hours.   CHEMISTRY Recent Labs  Lab 02/23/19 0200 02/23/19 1558 02/24/19 0150  02/25/19 0410 02/26/19 0420 02/26/19 2342 02/27/19 0236 02/27/19 1052 02/28/19 0130 02/28/19 0500   NA 136  --   --    < > 138 141 143  --  144 145 148*  K 4.2  --   --    < > 4.7 4.4 4.0  --  3.9 2.8* 3.4*  CL 112*  --   --    < > 107 107 105  --   --  97* 100  CO2 18*  --   --    < > 22 25 31   --   --  34* 33*  GLUCOSE 192*  --   --    < > 95 154* 143*  --   --  153* 164*  BUN 37*  --   --    < > 52* 51* 47*  --   --  36* 37*  CREATININE 1.83*  --   --    < > 1.58* 1.38* 1.09*  --   --  0.99 1.06*  CALCIUM 7.4*  --   --    < > 8.2* 9.0 9.1  --   --  8.8* 8.9  MG 2.3 2.5* 2.5*  --  2.4 2.1  --  1.0*  --   --  3.5*  PHOS 3.5 2.9 2.0*  --  3.7 4.3  --   --   --   --   --    < > = values in this interval not displayed.   Estimated Creatinine Clearance: 49.6 mL/min (A) (by C-G formula based on SCr of 1.06 mg/dL (H)).   LIVER Recent Labs  Lab 02/22/19 0745 02/23/19 0200 02/27/19 0236 02/28/19 0500  AST 90* 65* 40 51*  ALT 37 31 30 33  ALKPHOS 91 96 170* 129*  BILITOT 0.9 0.4 0.3 0.4  PROT 5.0* 5.1* 5.9* 5.6*  ALBUMIN 2.4* 2.2* 2.1* 2.1*  INR 1.4* 1.4* 1.3*  --      INFECTIOUS Recent Labs  Lab 02/21/19 1423 02/21/19 1453 02/23/19 0200 02/24/19 0150  LATICACIDVEN 3.9* 3.6* 1.2  --   PROCALCITON  --   --  19.26 13.27     ENDOCRINE CBG (last 3)  Recent Labs    02/28/19 0434 02/28/19 0828 02/28/19 1236  GLUCAP 153* 156* 141*         IMAGING x48h  - image(s) personally visualized  -   highlighted in bold US Renal  Result Date: 02/27/2019 CLINICAL DATA:  Initial evaluation for acute renal failure, UTI. EXAM: RENAL / URINARY TRACT ULTRASOUND COMPLETE COMPARISON:  Prior CT from 01/21/2019 FINDINGS: Right Kidney: Renal measurements: 10.4 x 5.5 x 6.5 cm. Echogenicity within normal limits. No mass or hydronephrosis visualized. Left Kidney: Not visualized. Bladder: Bladder decompressed with a Foley catheter in place. IMPRESSION: 1. Technically limited exam due  to body habitus and patient's inability to cooperate with the study. 2. Normal right kidney without  hydronephrosis. 3. Nonvisualization of the left kidney. Electronically Signed   By: Jeannine Boga M.D.   On: 02/27/2019 07:37   Dg Chest Port 1 View  Result Date: 02/28/2019 CLINICAL DATA:  Possible COVID-19.  Endotracheal tube EXAM: PORTABLE CHEST 1 VIEW COMPARISON:  02/27/2019 FINDINGS: Endotracheal tube in good position. Right arm PICC tip in the SVC unchanged. NG in the stomach Cardiac enlargement. Progression of bilateral airspace disease diffusely, left greater than right. Small bilateral effusions have progressed. Progression of left lower lobe consolidation. IMPRESSION: Significant progression of diffuse bilateral airspace disease left greater than right which may represent pneumonia or edema. Progression of bilateral effusions. Support lines remain in good position and unchanged. Electronically Signed   By: Franchot Gallo M.D.   On: 02/28/2019 08:10   Dg Chest Port 1 View  Result Date: 02/27/2019 CLINICAL DATA:  Intubated EXAM: PORTABLE CHEST 1 VIEW COMPARISON:  Chest radiograph from one day prior. FINDINGS: Endotracheal tube tip is 3.2 cm above the carina. Enteric tube enters stomach with the tip not seen on this image. Right PICC terminates in middle third of the SVC. Surgical clips overlie the right axilla. Stable cardiomediastinal silhouette with mild to moderate cardiomegaly. No pneumothorax. No pleural effusion. Diffuse hazy parahilar lung opacities appears slightly improved. IMPRESSION: 1. Well-positioned support structures. 2. Stable cardiomegaly. 3. Slight improvement in diffuse hazy parahilar lung opacities, suggesting improving pulmonary edema. Electronically Signed   By: Ilona Sorrel M.D.   On: 02/27/2019 07:58       Ventilator settings: Vent Mode: PSV;CPAP FiO2 (%):  [50 %-70 %] 50 % Set Rate:  [10 bmp] 10 bmp PEEP:  [5 cmH20] 5 cmH20 Pressure Support:  [10 cmH20] 10 cmH20 Plateau Pressure:  [22 cmH20] 22 cmH20    Assessment and Plan: Active Problems:    Pneumonia   CAP (community acquired pneumonia)   Acute respiratory failure with hypoxemia ([place]) ASSESSMENT / PLAN:  PULMONARY A:  Baseline copd nos -FEV1 101 % 12/2016 Acute resp failure due to bacterial sepsis  Severe vent asynchrony   02/28/2019 - > does not meet criteria for SBT/Extubation in setting of Acute Respiratory Failure due to agitation, fever   P:   Spontaneous breathing trials  No extubation 02/28/2019  PRVC BD (mdi v neb) Might be heading to trach  CARDIAC A: #Baseline nstemi , known CAD s/p stent RCA 05/2016  Hypertensive at home - on norvasc, coreg, and losaratn  #admit issues  Septic shock - resolved  Echo ef 55% with IW hypokiness s/p heparin IV ending 4/9/2   02/28/2019 -continue hypertension despite cardizem add on 24h ago  P: ASA 325  Lasix to continue Continue cardizem Cards consult when stablized See if bp gets better with diprivan (see CNS)    RENAL A:  AKI (baseline creat 0.8mg %). rEnal US ok  02/28/2019\ - improving, diuresing. Still volume overloaded but improved from  +9L 24h ago to +3L now  P:  Lasix 80mg  IV Q8h to coninue   GASTROINTESTINAL A:   NPO  P:   ct TFs ppi  HEMATOLOGIC A:  Anemia critical illness  P:  - PRBC for hgb </= 6.9gm%    - exceptions are   -  if ACS susepcted/confirmed then transfuse for hgb </= 8.0gm%,  or    -  active bleeding with hemodynamic instability, then transfuse regardless of hemoglobin value   At at all  times try to transfuse 1 unit prbc as possible with exception of active hemorrhage      INFECTIOUS A:   E coli UTI with bacteremia and septic shock at admit 02/21/2019 , covid RULED OUT. Renal US ok  02/28/2019 - recurrenct fever with rising wbc and worsening cxr despite lasix - concern for VAP  P:   Check pct and lactate Check pan culture Change ceftirixaone to vanc/zosyn   ENDOCRINE A:   At risk hyperglucemia P:   ssi  NEUROLOGIC A:   Agitated /vent  asynchrony  02/28/2019 - ongoing agitated delirium and worse delirium  P:   Change fent gtt and precedex gtt to dilaudig gtt + diprivan gtt Clonidine since 4/11 Increase versed prn Haldol and then prn  (EKG 450msec QTc on 4/12)  Home celexa  Since 4./11 Home trazodone since 4/11 Add nicotine patch (per duaghter 4/12) Continue requip      Best practices / Disposition: -->Code Status: FULL -->DVT Px:HSQ -->GI Px:PPI -->Diet:  TF - family:   4/8 - daughter updated,    - 4/10 -> d/w daughter and discussed goals -> discussed no CPR. She will discuss ith patient son / her brother. Touched upoing long term issues of LTAC etc., but right now forcus on CPR discussion . She will talk to her brother/patient son and get back  4/11 - per daughter full code unless irreversible. At her request home cns meds added   -4/12 - daughter Martha Mora updated (she did video chat earlier [place]))     [place]   The patient OMOLOLA MITTMAN is critically ill with multiple organ systems failure and requires high complexity decision making for assessment and support, frequent evaluation and titration of therapies, application of advanced monitoring technologies and extensive interpretation of multiple databases.   Critical Care Time devoted to patient care services described in this note is  30  Minutes. This time reflects time of care of this signee Dr Brand Males. This critical care time does not reflect procedure time, or teaching time or supervisory time of PA/NP/Med student/Med Resident etc but could involve care discussion time     Dr. Brand Males, M.D., [facility].C.P Pulmonary and Critical Care Medicine Staff Physician [place] [facility] Pager: [phone], If no answer or between  15:00h - 7:00h: call [phone]  02/28/2019 1:59 PM

## 2019-02-28 NOTE — Progress Notes (Signed)
[facility] Progress Note Patient Name: ZISSEL BIEDERMAN DOB: June 09, 1946 MRN: [id number]   Date of Service  02/28/2019  HPI/Events of Note  Hypokalemia and hypomag  eICU Interventions  Potassium and mag replaced     Intervention Category Intermediate Interventions: Electrolyte abnormality - evaluation and management  DETERDING,ELIZABETH 02/28/2019, 3:52 AM

## 2019-02-28 NOTE — Progress Notes (Signed)
Pharmacy Antibiotic Note  Martha Mora is a 73 y.o. female admitted on 02/21/2019 with pneumonia.    Plan: Zosyn 3.375 gm iv q8 Vanc 2 g x 1 then 1250 mg q36h Monitor renal fx cx vanc lvls prn Remove PCN allergy Mon if tolerates zosyn  Height: 4\' 11"  (149.9 cm) Weight: 234 lb 9.1 oz (106.4 kg) IBW/kg (Calculated) : 43.2  Temp (24hrs), Avg:100.2 F (37.9 C), Min:98.8 F (37.1 C), Max:101.6 F (38.7 C)  Recent Labs  Lab 02/21/19 1453  02/23/19 0200 02/24/19 0150  02/25/19 0410 02/26/19 0420 02/26/19 2342 02/27/19 0236 02/28/19 0130 02/28/19 0500  WBC  --    < > 30.6* 23.6*  --  16.9* 16.9*  --  18.9*  --  20.4*  CREATININE  --    < > 1.83*  --    < > 1.58* 1.38* 1.09*  --  0.99 1.06*  LATICACIDVEN 3.6*  --  1.2  --   --   --   --   --   --   --   --    < > = values in this interval not displayed.    Estimated Creatinine Clearance: 49.6 mL/min (A) (by C-G formula based on SCr of 1.06 mg/dL (H)).    Allergies  Allergen Reactions  . Keflex [Cephalexin] Other (See Comments)    "Made me feel funny"  . Scallops [Shellfish Allergy] Nausea And Vomiting  . Penicillins Rash and Other (See Comments)    Red bumps all over stomach Has patient had a PCN reaction causing immediate rash, facial/tongue/throat swelling, SOB or lightheadedness with hypotension: Yes Has patient had a PCN reaction causing severe rash involving mucus membranes or skin necrosis: No Has patient had a PCN reaction that required hospitalization: No Has patient had a PCN reaction occurring within the last 10 years: No If all of the above answers are "NO", then may proceed with Cephalosporin use.   Levester Fresh, PharmD, BCPS, BCCCP Clinical Pharmacist [phone]  Please check AMION for all [place] numbers  02/28/2019 2:34 PM

## 2019-03-01 LAB — CBC WITH DIFFERENTIAL/PLATELET
Abs Immature Granulocytes: 0.55 10*3/uL — ABNORMAL HIGH (ref 0.00–0.07)
Basophils Absolute: 0.1 10*3/uL (ref 0.0–0.1)
Basophils Relative: 0 %
Eosinophils Absolute: 0.4 10*3/uL (ref 0.0–0.5)
Eosinophils Relative: 2 %
HCT: 28.6 % — ABNORMAL LOW (ref 36.0–46.0)
Hemoglobin: 8.3 g/dL — ABNORMAL LOW (ref 12.0–15.0)
Immature Granulocytes: 3 %
Lymphocytes Relative: 13 %
Lymphs Abs: 2.8 10*3/uL (ref 0.7–4.0)
MCH: 25.5 pg — ABNORMAL LOW (ref 26.0–34.0)
MCHC: 29 g/dL — ABNORMAL LOW (ref 30.0–36.0)
MCV: 88 fL (ref 80.0–100.0)
Monocytes Absolute: 1.4 10*3/uL — ABNORMAL HIGH (ref 0.1–1.0)
Monocytes Relative: 7 %
Neutro Abs: 15.7 10*3/uL — ABNORMAL HIGH (ref 1.7–7.7)
Neutrophils Relative %: 75 %
Platelets: 199 10*3/uL (ref 150–400)
RBC: 3.25 MIL/uL — ABNORMAL LOW (ref 3.87–5.11)
RDW: 17.2 % — ABNORMAL HIGH (ref 11.5–15.5)
WBC: 20.9 10*3/uL — ABNORMAL HIGH (ref 4.0–10.5)
nRBC: 0.1 % (ref 0.0–0.2)

## 2019-03-01 LAB — COMPREHENSIVE METABOLIC PANEL
ALT: 50 U/L — ABNORMAL HIGH (ref 0–44)
AST: 100 U/L — ABNORMAL HIGH (ref 15–41)
Albumin: 2.2 g/dL — ABNORMAL LOW (ref 3.5–5.0)
Alkaline Phosphatase: 151 U/L — ABNORMAL HIGH (ref 38–126)
Anion gap: 12 (ref 5–15)
BUN: 41 mg/dL — ABNORMAL HIGH (ref 8–23)
CO2: 40 mmol/L — ABNORMAL HIGH (ref 22–32)
Calcium: 8.5 mg/dL — ABNORMAL LOW (ref 8.9–10.3)
Chloride: 102 mmol/L (ref 98–111)
Creatinine, Ser: 1.22 mg/dL — ABNORMAL HIGH (ref 0.44–1.00)
GFR calc Af Amer: 50 mL/min — ABNORMAL LOW (ref >60)
GFR calc non Af Amer: 43 mL/min — ABNORMAL LOW (ref >60)
Glucose, Bld: 133 mg/dL — ABNORMAL HIGH (ref 70–99)
Potassium: 2.8 mmol/L — ABNORMAL LOW (ref 3.5–5.1)
Sodium: 154 mmol/L — ABNORMAL HIGH (ref 135–145)
Total Bilirubin: 0.7 mg/dL (ref 0.3–1.2)
Total Protein: 5.4 g/dL — ABNORMAL LOW (ref 6.5–8.1)

## 2019-03-01 LAB — GLUCOSE, CAPILLARY
Glucose-Capillary: 118 mg/dL — ABNORMAL HIGH (ref 70–99)
Glucose-Capillary: 126 mg/dL — ABNORMAL HIGH (ref 70–99)
Glucose-Capillary: 130 mg/dL — ABNORMAL HIGH (ref 70–99)
Glucose-Capillary: 131 mg/dL — ABNORMAL HIGH (ref 70–99)
Glucose-Capillary: 134 mg/dL — ABNORMAL HIGH (ref 70–99)
Glucose-Capillary: 141 mg/dL — ABNORMAL HIGH (ref 70–99)
Glucose-Capillary: 147 mg/dL — ABNORMAL HIGH (ref 70–99)

## 2019-03-01 LAB — PROCALCITONIN: Procalcitonin: 0.99 ng/mL

## 2019-03-01 LAB — MAGNESIUM: Magnesium: 2.1 mg/dL (ref 1.7–2.4)

## 2019-03-01 MED ORDER — FUROSEMIDE 10 MG/ML IJ SOLN
40.0000 mg | Freq: Two times a day (BID) | INTRAMUSCULAR | Status: DC
  Administered 2019-03-02 (×2): 40 mg via INTRAVENOUS
  Filled 2019-03-01 (×3): qty 4

## 2019-03-01 MED ORDER — CLONIDINE HCL 0.2 MG PO TABS
0.2000 mg | ORAL_TABLET | Freq: Four times a day (QID) | ORAL | Status: AC
  Administered 2019-03-01 – 2019-03-02 (×4): 0.2 mg via ORAL
  Filled 2019-03-01 (×5): qty 1

## 2019-03-01 MED ORDER — POTASSIUM CHLORIDE 20 MEQ PO PACK
40.0000 meq | PACK | ORAL | Status: AC
  Administered 2019-03-01 (×3): 40 meq via ORAL
  Filled 2019-03-01 (×3): qty 2

## 2019-03-01 MED ORDER — HYDRALAZINE HCL 10 MG PO TABS
10.0000 mg | ORAL_TABLET | Freq: Three times a day (TID) | ORAL | Status: DC
  Administered 2019-03-01 – 2019-03-03 (×4): 10 mg via ORAL
  Filled 2019-03-01 (×6): qty 1

## 2019-03-01 MED ORDER — PRO-STAT SUGAR FREE PO LIQD
30.0000 mL | Freq: Three times a day (TID) | ORAL | Status: DC
  Administered 2019-03-01 – 2019-03-03 (×5): 30 mL
  Filled 2019-03-01 (×5): qty 30

## 2019-03-01 MED ORDER — JEVITY 1.2 CAL PO LIQD
1000.0000 mL | ORAL | Status: DC
  Administered 2019-03-02: 1000 mL
  Filled 2019-03-01 (×2): qty 1000

## 2019-03-01 MED ORDER — FREE WATER
200.0000 mL | Freq: Three times a day (TID) | Status: DC
  Administered 2019-03-01 – 2019-03-03 (×6): 200 mL

## 2019-03-01 NOTE — Progress Notes (Signed)
Nutrition Follow-up   RD working remotely.   DOCUMENTATION CODES:   Morbid obesity  INTERVENTION:   Tube feeding:  Change to Jevity 1.2 at 40 ml/hr once current bottle of Vital High Protein runs out (new RTH bottle hung around 1300 so new regimen will not start until tomorrow) Add Pro-Stat 30 mL TID Provides 1452 kcals, 98 g of protein and 778 mL of free water  Add free water flushes 200 mL q 8 hours secondary to free water deficit  TF regimen and propofol at current rate providing 2508 total kcal/day   NUTRITION DIAGNOSIS:   Inadequate oral intake related to acute illness as evidenced by NPO status.  Being addressed via TF   GOAL:   Patient will meet greater than or equal to 90% of their needs  Met  MONITOR:   Vent status, TF tolerance, Labs, Weight trends  REASON FOR ASSESSMENT:   Consult, Ventilator Enteral/tube feeding initiation and management  ASSESSMENT:   73 yo female admitted on 4/5 with acute respiratory failure due to bacterial sepsis but also COVID-19 rule out. Intubated on 4/6 and transferred to ICU. PMH includes COPD, HTN, CAD s/p PCI   COVID negative  Patient is currently intubated on ventilator support; pt sedation regimen changed overnight, started on propofol MV: 6.2 L/min Temp (24hrs), Avg:99.3 F (37.4 C), Min:98 F (36.7 C), Max:100.5 F (38.1 C)  Propofol: 40 ml/hr   Tolerating Vital High Protein at 40 ml/hr, Pro-Stat 30 mL daily via OG tube with tip in stomach  Hypernatremic, no free water flushes at present  Current weight 101.6 kg; admission weight   Labs: sodium 154 (H), potassium 2.8 (L), Creatinine 1.22, BUN 41 Meds: lasix, MVI with Minerals  Diet Order:   Diet Order    None      EDUCATION NEEDS:   Not appropriate for education at this time  Skin:  Skin Assessment: Reviewed RN Assessment(no pressure injuries noted)  Last BM:  4/13  Height:   Ht Readings from Last 1 Encounters:  02/23/19 _0  (1.499 m)     Weight:   Wt Readings from Last 1 Encounters:  03/01/19 101.6 kg    Ideal Body Weight:  43 kg  BMI:  Body mass index is 45.24 kg/m.  Estimated Nutritional Needs:   Kcal:  1100-1400 kcals   Protein:  86-108 g  Fluid:  >/= 1.5 L   Kerman Passey MS, RD, LDN, CNSC [phone] Pager  [phone] Weekend/On-Call Pager

## 2019-03-01 NOTE — Progress Notes (Signed)
Name: Martha Mora MRN: [id number] DOB: 05/24/1946 CC: SOB   LOS: 7  [facility] / Critical Care Note   73 yo adm 4/5  with worsening shortness of breath, fevers, chills and cough as well as foul smelling urine. She continues to smoke E-cigs and has a 80 PPD hx of smoking.   She was admitted to the medical floor on 2 L [place] , CCM called as patient became tachypneic and was requiring escalating amount of oxygen on 11 L NRB. Tx to ICU and intubated. Found to have E. coli septic shock CO VID ruled out   Lines / Drains:  RUE PICC 4/6  ETT 4/6 >>  Cultures:  BC 4/5 e coli  Urine 4/5 >> e coli ////// Blood 4/12 Urine 4/12 Trach 4212  Antibiotics:  rocephin 4/6 >>4/12 .[name]... Vanc 4/12 > Zosyn 4/12 >    EVENTS   CT abdomen 3/5 >>> 11 mm stone left kidey, rt punctate stone, no hnosis US renal   4/8 vent asynchrony  4/9 -  Urine output increased with Lasix, back down on second shift Afebrile Remains critically ill, intubated on low-dose Levophed Severe vent asynchrony on 4/8 requiring increased sedation  4/10 - desaturated on vent at fio2 30%. Seems dysnch with vent. On fent gtt and precedex gtt  on sbt. Hypertensive, agitated despite fent gtt and precedex gtt. Diursed with test dose lasix but still +9L positive since admit. Worsning scleral edema   4/12- still agitated despite med changes yesterday. Continues with fever up and down. Hypretensive and constipated. On fent gtt + precedex gtt + haldol + versed prn + other home meds. Stil with fevere    SUBJECTIVE/OVERNIGHT/INTERVAL HX    03/01/2019  - hypertensive despite cardizem. gEtting aKI with diruesis,. Since yesterday on dilaudid gtt and diprivan gtt with nicotine patch,. On 50% fio2 and calmer but still confused ? Better. Dong PSV  PCT  yesterda pre abx suggestive of localized infection      Vital Signs: Temp:  [98 F (36.7 C)-100.5 F (38.1 C)] 99.7 F (37.6 C) (04/13 1200) Pulse Rate:  [47-88] 88  (04/13 1405) Resp:  [10-25] 13 (04/13 1405) BP: (85-172)/(40-90) 160/56 (04/13 1405) SpO2:  [90 %-100 %] 93 % (04/13 1405) FiO2 (%):  [40 %-50 %] 50 % (04/13 1132) Weight:  [101.6 kg] 101.6 kg (04/13 0500) I/O last 3 completed shifts: In: 3846.1 [I.V.:1844.1; NG/GT:1330; IV Piggyback:672.1] Out: 9375 [Urine:9375]     General Appearance:  Looks criticall ill OBESE - + Head:  Normocephalic, without obvious abnormality, atraumatic Eyes:  PERRL - yes, conjunctiva/corneas - scleral edema +     Ears:  Normal external ear canals, both ears Nose:  G tube - no Throat:  ETT TUBE - yes , OG tube - yes Neck:  Supple,  No enlargement/tenderness/nodules Lungs: Clear to auscultation bilaterally, Ventilator   Synchrony - yes doing PSV Heart:  S1 and S2 normal, no murmur, CVP - no.  Pressors - no Abdomen:  Soft, no masses, no organomegaly Genitalia / Rectal:  Not done Extremities:  Extremities- intact Skin:  ntact in exposed areas . Sacral area - not examined Neurologic:  Sedation - dilaudid and diprivan  And oral scheduled -> RASS - 0 . Moves all 4s - yes. CAM-ICU - 1 time she nodded yes and this is a huge improvement . Orientation - not             LABS    PULMONARY Recent Labs  Lab  02/22/19 2040 02/22/19 2305 02/27/19 1052  PHART 7.221* 7.327* 7.406  PCO2ART 47.0 31.5* 44.7  PO2ART 87.8 153* 66.0*  HCO3 18.6* 16.0* 27.8  TCO2  --   --  29  O2SAT 95.4 98.6 91.0    CBC Recent Labs  Lab 02/27/19 0236 02/27/19 1052 02/28/19 0500 03/01/19 0500  HGB 8.8* 8.2* 8.6* 8.3*  HCT 28.9* 24.0* 27.5* 28.6*  WBC 18.9*  --  20.4* 20.9*  PLT PLATELET CLUMPS NOTED ON SMEAR, COUNT APPEARS ADEQUATE  --  282 199    COAGULATION Recent Labs  Lab 02/23/19 0200 02/27/19 0236  INR 1.4* 1.3*    CARDIAC   Recent Labs  Lab 02/23/19 0200 02/23/19 0941 02/23/19 1345 02/23/19 2000  TROPONINI 4.44* 2.65* 1.50* 1.65*   No results for input(s): PROBNP in the last 168  hours.   CHEMISTRY Recent Labs  Lab 02/23/19 0200 02/23/19 1558 02/24/19 0150  02/25/19 0410 02/26/19 0420 02/26/19 2342 02/27/19 0236 02/27/19 1052 02/28/19 0130 02/28/19 0500 03/01/19 0500  NA 136  --   --    < > 138 141 143  --  144 145 148* 154*  K 4.2  --   --    < > 4.7 4.4 4.0  --  3.9 2.8* 3.4* 2.8*  CL 112*  --   --    < > 107 107 105  --   --  97* 100 102  CO2 18*  --   --    < > 22 25 31   --   --  34* 33* 40*  GLUCOSE 192*  --   --    < > 95 154* 143*  --   --  153* 164* 133*  BUN 37*  --   --    < > 52* 51* 47*  --   --  36* 37* 41*  CREATININE 1.83*  --   --    < > 1.58* 1.38* 1.09*  --   --  0.99 1.06* 1.22*  CALCIUM 7.4*  --   --    < > 8.2* 9.0 9.1  --   --  8.8* 8.9 8.5*  MG 2.3 2.5* 2.5*  --  2.4 2.1  --  1.0*  --   --  3.5* 2.1  PHOS 3.5 2.9 2.0*  --  3.7 4.3  --   --   --   --   --   --    < > = values in this interval not displayed.   Estimated Creatinine Clearance: 41.9 mL/min (A) (by C-G formula based on SCr of 1.22 mg/dL (H)).   LIVER Recent Labs  Lab 02/23/19 0200 02/27/19 0236 02/28/19 0500 03/01/19 0500  AST 65* 40 51* 100*  ALT 31 30 33 50*  ALKPHOS 96 170* 129* 151*  BILITOT 0.4 0.3 0.4 0.7  PROT 5.1* 5.9* 5.6* 5.4*  ALBUMIN 2.2* 2.1* 2.1* 2.2*  INR 1.4* 1.3*  --   --      INFECTIOUS Recent Labs  Lab 02/23/19 0200 02/24/19 0150 02/28/19 1353 03/01/19 0500  LATICACIDVEN 1.2  --  0.8  --   PROCALCITON 19.26 13.27 1.03 0.99     ENDOCRINE CBG (last 3)  Recent Labs    03/01/19 0427 03/01/19 0821 03/01/19 1149  GLUCAP 118* 134* 131*         IMAGING x48h  - image(s) personally visualized  -   highlighted in bold Dg Chest Port 1 View  Result Date: 02/28/2019 CLINICAL  DATA:  Possible COVID-19.  Endotracheal tube EXAM: PORTABLE CHEST 1 VIEW COMPARISON:  02/27/2019 FINDINGS: Endotracheal tube in good position. Right arm PICC tip in the SVC unchanged. NG in the stomach Cardiac enlargement. Progression of bilateral  airspace disease diffusely, left greater than right. Small bilateral effusions have progressed. Progression of left lower lobe consolidation. IMPRESSION: Significant progression of diffuse bilateral airspace disease left greater than right which may represent pneumonia or edema. Progression of bilateral effusions. Support lines remain in good position and unchanged. Electronically Signed   By: Franchot Gallo M.D.   On: 02/28/2019 08:10       Ventilator settings: Vent Mode: PSV;CPAP FiO2 (%):  [40 %-50 %] 50 % Set Rate:  [10 bmp] 10 bmp PEEP:  [5 cmH20] 5 cmH20 Pressure Support:  [10 cmH20] 10 cmH20 Plateau Pressure:  [16 cmH20-20 cmH20] 17 cmH20    Assessment and Plan: Active Problems:   Pneumonia   CAP (community acquired pneumonia)   Acute respiratory failure with hypoxemia ([place]) ASSESSMENT / PLAN:  PULMONARY A:  Baseline copd nos -FEV1 101 % 12/2016 Acute resp failure due to bacterial sepsis  Severe vent asynchrony   03/01/2019 - >03/01/2019 - > does yes meet criteria for SBT but not Extubation in setting of Acute Respiratory Failure due to agitation    P:   Spontaneous breathing trials  No extubation 03/01/2019  PRVC BD (mdi v neb) Might be heading to trach  CARDIAC A: #Baseline nstemi , known CAD s/p stent RCA 05/2016  Hypertensive at home - on norvasc, coreg, and losaratn  #admit issues  Septic shock - resolved  Echo ef 55% with IW hypokiness s/p heparin IV ending 4/9/2   03/01/2019 -still hypertsive  P: ASA 325  Lasix to continue but reduce Add hydralazine po Continue cardizem Cards consult when stablized    RENAL A:  AKI (baseline creat 0.8mg %). rEnal US ok  03/01/2019\ - AKI with duresis  And low K  P:  Lasix 80mg  IV Q8h  -> reeduce to 40 Q12h KC: repletion  GASTROINTESTINAL A:   NPO  P:   ct TFs ppi  HEMATOLOGIC A:  Anemia critical illness  P:  - PRBC for hgb </= 6.9gm%    - exceptions are   -  if ACS susepcted/confirmed then  transfuse for hgb </= 8.0gm%,  or    -  active bleeding with hemodynamic instability, then transfuse regardless of hemoglobin value   At at all times try to transfuse 1 unit prbc as possible with exception of active hemorrhage      INFECTIOUS A:   E coli UTI with bacteremia and septic shock at admit 02/21/2019 , covid RULED OUT. Renal US ok  03/01/2019 - recurrenct fever with rising wbc and PCT on 4/12 - concern for VAP  P:   Await pan culture from 4/12  vanc/zosyn   ENDOCRINE A:   At risk hyperglucemia P:   ssi  NEUROLOGIC A:   Agitated /vent asynchrony  03/01/2019 - ongoing agitated delirium and  ? Better  P:    dilaudig gtt + diprivan gtt Clonidine since 4/11 versed prn Haldol and then prn  (EKG 416msec QTc on 4/12)  Home celexa  Since 4./11 Home trazodone since 4/11 nicotine patch (per duaghter 4/12) Continue requip      Best practices / Disposition: -->Code Status: FULL -->DVT Px:HSQ -->GI Px:PPI -->Diet:  TF - family:   4/8 - daughter updated,    - 4/10 -> d/w daughter and  discussed goals -> discussed no CPR. She will discuss ith patient son / her brother. Touched upoing long term issues of LTAC etc., but right now forcus on CPR discussion . She will talk to her brother/patient son and get back  4/11 - per daughter full code unless irreversible. At her request home cns meds added   -4/12 and 4/13  - daughter Martha Mora updated (she did video chat earlier [place]))     [place]   The patient Martha Mora is critically ill with multiple organ systems failure and requires high complexity decision making for assessment and support, frequent evaluation and titration of therapies, application of advanced monitoring technologies and extensive interpretation of multiple databases.   Critical Care Time devoted to patient care services described in this note is  30  Minutes. This time reflects time of care of this signee Dr Brand Males. This critical care time does not reflect procedure time, or teaching time or supervisory time of PA/NP/Med student/Med Resident etc but could involve care discussion time     Dr. Brand Males, M.D., [facility].C.P Pulmonary and Critical Care Medicine Staff Physician [facility] Pager: [phone], If no answer or between  15:00h - 7:00h: call [phone]  03/01/2019 3:20 PM

## 2019-03-01 NOTE — Progress Notes (Signed)
Spoke w/ pts dtr [name] [place] to provide updates. [name] requesting e-link visitation.  Spoke w. Margaree Mackintosh to provide [facility] name and number.

## 2019-03-01 NOTE — Progress Notes (Signed)
RT note: patient placed on CPAP/PSV of 10/5 at 0743.  Currently tolerating well.  Will continue to monitor.

## 2019-03-01 NOTE — Progress Notes (Signed)
FPTSSocialProgress Note  Following along socially, appreciate care by CCM and will resume care when patient stable for transfer out to floor.  [place] Christoper Bushey, DO PGY-2, [facility]  7:34 AM 03/01/19 - Please contact intern pager [phone] as needed

## 2019-03-01 NOTE — Progress Notes (Signed)
Assisted elink camera with family

## 2019-03-02 ENCOUNTER — Inpatient Hospital Stay (HOSPITAL_COMMUNITY): Payer: Medicare Other

## 2019-03-02 LAB — BASIC METABOLIC PANEL
Anion gap: 12 (ref 5–15)
BUN: 39 mg/dL — ABNORMAL HIGH (ref 8–23)
CO2: 43 mmol/L — ABNORMAL HIGH (ref 22–32)
Calcium: 8.7 mg/dL — ABNORMAL LOW (ref 8.9–10.3)
Chloride: 100 mmol/L (ref 98–111)
Creatinine, Ser: 1.18 mg/dL — ABNORMAL HIGH (ref 0.44–1.00)
GFR calc Af Amer: 52 mL/min — ABNORMAL LOW (ref >60)
GFR calc non Af Amer: 45 mL/min — ABNORMAL LOW (ref >60)
Glucose, Bld: 152 mg/dL — ABNORMAL HIGH (ref 70–99)
Potassium: 2.9 mmol/L — ABNORMAL LOW (ref 3.5–5.1)
Sodium: 155 mmol/L — ABNORMAL HIGH (ref 135–145)

## 2019-03-02 LAB — CULTURE, RESPIRATORY W GRAM STAIN: Culture: NO GROWTH

## 2019-03-02 LAB — CBC WITH DIFFERENTIAL/PLATELET
Abs Immature Granulocytes: 0.57 10*3/uL — ABNORMAL HIGH (ref 0.00–0.07)
Basophils Absolute: 0.1 10*3/uL (ref 0.0–0.1)
Basophils Relative: 1 %
Eosinophils Absolute: 0.3 10*3/uL (ref 0.0–0.5)
Eosinophils Relative: 1 %
HCT: 30.5 % — ABNORMAL LOW (ref 36.0–46.0)
Hemoglobin: 8.7 g/dL — ABNORMAL LOW (ref 12.0–15.0)
Immature Granulocytes: 2 %
Lymphocytes Relative: 10 %
Lymphs Abs: 2.3 10*3/uL (ref 0.7–4.0)
MCH: 25.6 pg — ABNORMAL LOW (ref 26.0–34.0)
MCHC: 28.5 g/dL — ABNORMAL LOW (ref 30.0–36.0)
MCV: 89.7 fL (ref 80.0–100.0)
Monocytes Absolute: 1.4 10*3/uL — ABNORMAL HIGH (ref 0.1–1.0)
Monocytes Relative: 6 %
Neutro Abs: 19.4 10*3/uL — ABNORMAL HIGH (ref 1.7–7.7)
Neutrophils Relative %: 80 %
Platelets: 298 10*3/uL (ref 150–400)
RBC: 3.4 MIL/uL — ABNORMAL LOW (ref 3.87–5.11)
RDW: 17.3 % — ABNORMAL HIGH (ref 11.5–15.5)
WBC: 24.1 10*3/uL — ABNORMAL HIGH (ref 4.0–10.5)
nRBC: 0.2 % (ref 0.0–0.2)

## 2019-03-02 LAB — MAGNESIUM: Magnesium: 2.1 mg/dL (ref 1.7–2.4)

## 2019-03-02 LAB — GLUCOSE, CAPILLARY
Glucose-Capillary: 135 mg/dL — ABNORMAL HIGH (ref 70–99)
Glucose-Capillary: 137 mg/dL — ABNORMAL HIGH (ref 70–99)
Glucose-Capillary: 146 mg/dL — ABNORMAL HIGH (ref 70–99)
Glucose-Capillary: 159 mg/dL — ABNORMAL HIGH (ref 70–99)
Glucose-Capillary: 170 mg/dL — ABNORMAL HIGH (ref 70–99)
Glucose-Capillary: 181 mg/dL — ABNORMAL HIGH (ref 70–99)

## 2019-03-02 LAB — BRAIN NATRIURETIC PEPTIDE: B Natriuretic Peptide: 823 pg/mL — ABNORMAL HIGH (ref 0.0–100.0)

## 2019-03-02 LAB — MRSA PCR SCREENING: MRSA by PCR: NEGATIVE

## 2019-03-02 LAB — PROCALCITONIN: Procalcitonin: 0.63 ng/mL

## 2019-03-02 MED ORDER — POTASSIUM CHLORIDE 20 MEQ/15ML (10%) PO SOLN
20.0000 meq | Freq: Once | ORAL | Status: AC
  Administered 2019-03-02: 06:00:00 20 meq
  Filled 2019-03-02: qty 15

## 2019-03-02 MED ORDER — CLONIDINE HCL 0.1 MG PO TABS: 0.1000 mg | ORAL_TABLET | Freq: Every day | ORAL | Status: DC

## 2019-03-02 MED ORDER — CLONIDINE HCL 0.1 MG PO TABS
0.1000 mg | ORAL_TABLET | Freq: Four times a day (QID) | ORAL | Status: DC
  Administered 2019-03-03: 0.1 mg via ORAL
  Filled 2019-03-02: qty 1

## 2019-03-02 MED ORDER — CLONIDINE HCL 0.1 MG PO TABS: 0.1000 mg | ORAL_TABLET | Freq: Two times a day (BID) | ORAL | Status: DC

## 2019-03-02 MED ORDER — POTASSIUM CHLORIDE 10 MEQ/50ML IV SOLN
10.0000 meq | INTRAVENOUS | Status: AC
  Administered 2019-03-02 (×4): 10 meq via INTRAVENOUS
  Filled 2019-03-02 (×4): qty 50

## 2019-03-02 NOTE — Progress Notes (Signed)
RT note: patient placed on CPAP/PSV of 5/5 at 0730.  Currently tolerating well.  Will continue to monitor.  

## 2019-03-02 NOTE — Progress Notes (Signed)
FPTS Social Progress Note   Following along socially, appreciate care by CCM and will resume care when patient stable for transfer out to floor.   Martha Arianah Torgeson, DO PGY-2, [facility]  8:00 AM 03/02/19 - Please contact intern pager [phone] as needed

## 2019-03-02 NOTE — Progress Notes (Signed)
OG tube advanced 8cm per MD order. CXR ordered to verify.

## 2019-03-02 NOTE — Progress Notes (Signed)
Name: Martha Mora MRN: [id number] DOB: 1946/10/11 CC: SOB   LOS: 73  [facility] / Critical Care Note   73 yo adm 4/5  with worsening shortness of breath, fevers, chills and cough as well as foul smelling urine. She continues to smoke E-cigs and has a 80 PPD hx of smoking.   She was admitted to the medical floor on 2 L [place] , CCM called as patient became tachypneic and was requiring escalating amount of oxygen on 11 L NRB. Tx to ICU and intubated. Found to have E. coli septic shock CO VID ruled out   Lines / Drains:  RUE PICC 4/6  ETT 4/6 >>  Cultures:  BC 4/5 e coli  Urine 4/5 >> e coli ////// Blood 4/12 Urine 4/12 Trach 4212  Antibiotics:  rocephin 4/6 >>4/12 .[name]... Vanc 4/12 > Zosyn 4/12 >    EVENTS   CT abdomen 3/5 >>> 11 mm stone left kidey, rt punctate stone, no hnosis US renal   4/8 vent asynchrony  4/9 -  Urine output increased with Lasix, back down on second shift Afebrile Remains critically ill, intubated on low-dose Levophed Severe vent asynchrony on 4/8 requiring increased sedation  4/10 - desaturated on vent at fio2 30%. Seems dysnch with vent. On fent gtt and precedex gtt  on sbt. Hypertensive, agitated despite fent gtt and precedex gtt. Diursed with test dose lasix but still +9L positive since admit. Worsning scleral edema   4/12- still agitated despite med changes yesterday. Continues with fever up and down. Hypretensive and constipated. On fent gtt + precedex gtt + haldol + versed prn + other home meds. Stil with fevere  hypertensive despite cardizem. gEtting aKI with diruesis,. Since yesterday on dilaudid gtt and diprivan gtt with nicotine patch,. On 50% fio2 and calmer but still confused ? Better. Dong PSV  PCT  yesterda pre abx suggestive of localized infection    SUBJECTIVE/OVERNIGHT/INTERVAL HX    03/02/2019  - doing PSV. AKI better after lasix reduction    Vital Signs: Temp:  [99 F (37.2 C)-100.2 F (37.9 C)] 99.4 F (37.4  C) (04/14 1200) Pulse Rate:  [49-89] 69 (04/14 1530) Resp:  [10-22] 11 (04/14 1530) BP: (87-193)/(36-121) 141/43 (04/14 1530) SpO2:  [87 %-98 %] 92 % (04/14 1530) FiO2 (%):  [50 %] 50 % (04/14 1513) Weight:  [100 kg] 100 kg (04/14 0500) I/O last 3 completed shifts: In: 4093.2 [I.V.:1269.4; NG/GT:2260; IV Piggyback:563.8] Out: 5900 [Urine:5900]    General Appearance:  Looks criticall ill OBESE - + but looking beter Head:  Normocephalic, without obvious abnormality, atraumatic Eyes:  PERRL - yes, conjunctiva/corneas - scleral edema +     Ears:  Normal external ear canals, both ears Nose:  G tube - no Throat:  ETT TUBE - yes , OG tube - yes Neck:  Supple,  No enlargement/tenderness/nodules Lungs: Clear to auscultation bilaterally, Ventilator   Synchrony - yes Heart:  S1 and S2 normal, no murmur, CVP - no.  Pressors - no Abdomen:  Soft, no masses, no organomegaly Genitalia / Rectal:  Not done Extremities:  Extremities- nitact Skin:  ntact in exposed areas . Sacral area - not examined Neurologic:  Sedation - benig weaned -> RASS - -2 to +1 . Moves all 4s - yes. CAM-ICU - some intermitent command following              LABS    PULMONARY Recent Labs  Lab 02/27/19 1052  PHART 7.406  PCO2ART 44.7  PO2ART 66.0*  HCO3 27.8  TCO2 29  O2SAT 91.0    CBC Recent Labs  Lab 02/28/19 0500 03/01/19 0500 03/02/19 0409  HGB 8.6* 8.3* 8.7*  HCT 27.5* 28.6* 30.5*  WBC 20.4* 20.9* 24.1*  PLT 282 199 298    COAGULATION Recent Labs  Lab 02/27/19 0236  INR 1.3*    CARDIAC   Recent Labs  Lab 02/23/19 2000  TROPONINI 1.65*   No results for input(s): PROBNP in the last 168 hours.   CHEMISTRY Recent Labs  Lab 02/24/19 0150  02/25/19 0410 02/26/19 0420 02/26/19 2342 02/27/19 0236 02/27/19 1052 02/28/19 0130 02/28/19 0500 03/01/19 0500 03/02/19 0409  NA  --    < > 138 141 143  --  144 145 148* 154* 155*  K  --    < > 4.7 4.4 4.0  --  3.9 2.8* 3.4* 2.8*  2.9*  CL  --    < > 107 107 105  --   --  97* 100 102 100  CO2  --    < > 22 25 31   --   --  34* 33* 40* 43*  GLUCOSE  --    < > 95 154* 143*  --   --  153* 164* 133* 152*  BUN  --    < > 52* 51* 47*  --   --  36* 37* 41* 39*  CREATININE  --    < > 1.58* 1.38* 1.09*  --   --  0.99 1.06* 1.22* 1.18*  CALCIUM  --    < > 8.2* 9.0 9.1  --   --  8.8* 8.9 8.5* 8.7*  MG 2.5*  --  2.4 2.1  --  1.0*  --   --  3.5* 2.1 2.1  PHOS 2.0*  --  3.7 4.3  --   --   --   --   --   --   --    < > = values in this interval not displayed.   Estimated Creatinine Clearance: 42.9 mL/min (A) (by C-G formula based on SCr of 1.18 mg/dL (H)).   LIVER Recent Labs  Lab 02/27/19 0236 02/28/19 0500 03/01/19 0500  AST 40 51* 100*  ALT 30 33 50*  ALKPHOS 170* 129* 151*  BILITOT 0.3 0.4 0.7  PROT 5.9* 5.6* 5.4*  ALBUMIN 2.1* 2.1* 2.2*  INR 1.3*  --   --      INFECTIOUS Recent Labs  Lab 02/28/19 1353 03/01/19 0500 03/02/19 0409  LATICACIDVEN 0.8  --   --   PROCALCITON 1.03 0.99 0.63     ENDOCRINE CBG (last 3)  Recent Labs    03/02/19 0331 03/02/19 0801 03/02/19 1236  GLUCAP 135* 146* 137*         IMAGING x48h  - image(s) personally visualized  -   highlighted in bold Dg Chest Port 1 View  Result Date: 03/02/2019 CLINICAL DATA:  Hypoxia EXAM: PORTABLE CHEST 1 VIEW COMPARISON:  February 28, 2019 FINDINGS: Endotracheal tube tip is 2.4 cm above the carina. Nasogastric tube tip is in the proximal stomach with the side port above the gastroesophageal junction. Central catheter tip is in the superior vena cava. No pneumothorax. There is cardiomegaly with pulmonary venous hypertension. There are pleural effusions bilaterally. There is atelectatic change in the right base. There is a degree of underlying interstitial edema. Surgical clips are noted in the right axillary region. No bone lesions. No adenopathy evident. IMPRESSION: Tube and catheter  positions as described without pneumothorax. Note that the  side port of the nasogastric tube is above the diaphragm. Advise advancing nasogastric tube 8-10 cm. Pulmonary vascular congestion with interstitial edema and bilateral pleural effusions. Suspect a degree of underlying congestive heart failure. There may be a degree of ARDS as well. No consolidation. There is right base atelectasis. Electronically Signed   By: Lowella Grip III M.D.   On: 03/02/2019 09:50       Ventilator settings: Vent Mode: PSV;CPAP FiO2 (%):  [50 %] 50 % Set Rate:  [10 bmp] 10 bmp PEEP:  [5 cmH20] 5 cmH20 Pressure Support:  [5 cmH20] 5 cmH20 Plateau Pressure:  [14 cmH20-17 cmH20] 17 cmH20    Assessment and Plan: Active Problems:   Pneumonia   CAP (community acquired pneumonia)   Acute respiratory failure with hypoxemia ([place])   ASSESSMENT / PLAN:  PULMONARY A:  Baseline copd nos -FEV1 101 % 12/2016 Acute resp failure due to bacterial sepsis  Severe vent asynchrony   03/02/2019 - > does not meet criteria for Extubation in setting of Acute Respiratory Failure due to agitation but is doing SBT and slowly more responsive    P:   Spontaneous breathing trials  No extubation 03/02/2019  - getting close PRVC BD (mdi v neb) Might be heading to trach  CARDIAC A: #Baseline nstemi , known CAD s/p stent RCA 05/2016  Hypertensive at home - on norvasc, coreg, and losaratn  #admit issues  Septic shock - resolved  Echo ef 55% with IW hypokiness s/p heparin IV ending 4/9/2   03/02/2019 -still hypertsive  P: ASA 325  Lasix to continue but reduce Add hydralazine po Continue cardizem Cards consult when stablized    RENAL A:  AKI (baseline creat 0.8mg %). rEnal US ok  03/02/2019\ - AKI with duresis  And getting better  P:  Lasix tto 40 Q12h KC: repletion  GASTROINTESTINAL A:   NPO  P:   ct TFs ppi  HEMATOLOGIC A:  Anemia critical illness  P:  - PRBC for hgb </= 6.9gm%    - exceptions are   -  if ACS susepcted/confirmed then  transfuse for hgb </= 8.0gm%,  or    -  active bleeding with hemodynamic instability, then transfuse regardless of hemoglobin value   At at all times try to transfuse 1 unit prbc as possible with exception of active hemorrhage      INFECTIOUS A:   E coli UTI with bacteremia and septic shock at admit 02/21/2019 , covid RULED OUT. Renal US ok  03/02/2019 - recurrenct fever with rising wbc and PCT on 4/12 - concern for VAP Fever better 03/02/2019   P:   Await pan culture from 4/12  vanc/zosyn   ENDOCRINE A:   At risk hyperglucemia P:   ssi  NEUROLOGIC A:   Agitated /vent asynchrony  03/02/2019 - improved delirium  P:   diprivan gtt continue Reduce dilaudid gtt to off -> 0.5mg /hr Clonidine since 4/11 versed prn Haldol and then prn  (EKG 438msec QTc on 4/12)  Home celexa  Since 4./11 Home trazodone since 4/11 nicotine patch (per duaghter 4/12) Continue requip      Best practices / Disposition: -->Code Status: FULL -->DVT Px:HSQ -->GI Px:PPI -->Diet:  TF - family:   4/8 - daughter updated,    - 4/10 -> d/w daughter and discussed goals -> discussed no CPR. She will discuss ith patient son / her brother. Touched upoing long term issues of LTAC etc.,  but right now forcus on CPR discussion . She will talk to her brother/patient son and get back  4/11 - per daughter full code unless irreversible. At her request home cns meds added   -4/12 and 4/13  - daughter [name] Poplin updated (she did video chat earlier [place]))     [place]   The patient Martha Mora is critically ill with multiple organ systems failure and requires high complexity decision making for assessment and support, frequent evaluation and titration of therapies, application of advanced monitoring technologies and extensive interpretation of multiple databases.   Critical Care Time devoted to patient care services described in this note is  30  Minutes. This time reflects time of  care of this signee Dr Brand Males. This critical care time does not reflect procedure time, or teaching time or supervisory time of PA/NP/Med student/Med Resident etc but could involve care discussion time     Dr. Brand Males, M.D., [facility].C.P Pulmonary and Critical Care Medicine Staff Physician [facility] Pager: [phone], If no answer or between  15:00h - 7:00h: call [phone]  03/02/2019 4:01 PM

## 2019-03-02 NOTE — Progress Notes (Signed)
[facility] Progress Note Patient Name: Martha Mora DOB: [birth year] MRN: [id number]   Date of Service  03/02/2019  HPI/Events of Note  K 2.7, Cr 1.18  eICU Interventions  Kcl 10 meq IV q1 hr x 4, Kcl 20 meq liquid via tube once.      Intervention Category Intermediate Interventions: Electrolyte abnormality - evaluation and management  Elmer Sow 03/02/2019, 5:30 AM

## 2019-03-03 ENCOUNTER — Inpatient Hospital Stay (HOSPITAL_COMMUNITY): Payer: Medicare Other

## 2019-03-03 LAB — CBC WITH DIFFERENTIAL/PLATELET
Abs Immature Granulocytes: 0.35 10*3/uL — ABNORMAL HIGH (ref 0.00–0.07)
Basophils Absolute: 0.1 10*3/uL (ref 0.0–0.1)
Basophils Relative: 0 %
Eosinophils Absolute: 0.2 10*3/uL (ref 0.0–0.5)
Eosinophils Relative: 1 %
HCT: 29.5 % — ABNORMAL LOW (ref 36.0–46.0)
Hemoglobin: 8.7 g/dL — ABNORMAL LOW (ref 12.0–15.0)
Immature Granulocytes: 1 %
Lymphocytes Relative: 10 %
Lymphs Abs: 2.4 10*3/uL (ref 0.7–4.0)
MCH: 27.1 pg (ref 26.0–34.0)
MCHC: 29.5 g/dL — ABNORMAL LOW (ref 30.0–36.0)
MCV: 91.9 fL (ref 80.0–100.0)
Monocytes Absolute: 1.2 10*3/uL — ABNORMAL HIGH (ref 0.1–1.0)
Monocytes Relative: 5 %
Neutro Abs: 20.7 10*3/uL — ABNORMAL HIGH (ref 1.7–7.7)
Neutrophils Relative %: 83 %
Platelets: 389 10*3/uL (ref 150–400)
RBC: 3.21 MIL/uL — ABNORMAL LOW (ref 3.87–5.11)
RDW: 17.4 % — ABNORMAL HIGH (ref 11.5–15.5)
WBC: 24.9 10*3/uL — ABNORMAL HIGH (ref 4.0–10.5)
nRBC: 0.3 % — ABNORMAL HIGH (ref 0.0–0.2)

## 2019-03-03 LAB — TRIGLYCERIDES: Triglycerides: 216 mg/dL — ABNORMAL HIGH (ref <150)

## 2019-03-03 LAB — BASIC METABOLIC PANEL
Anion gap: 10 (ref 5–15)
Anion gap: 13 (ref 5–15)
BUN: 44 mg/dL — ABNORMAL HIGH (ref 8–23)
BUN: 39 mg/dL — ABNORMAL HIGH (ref 8–23)
CO2: 44 mmol/L — ABNORMAL HIGH (ref 22–32)
CO2: 40 mmol/L — ABNORMAL HIGH (ref 22–32)
Calcium: 8.7 mg/dL — ABNORMAL LOW (ref 8.9–10.3)
Calcium: 9 mg/dL (ref 8.9–10.3)
Chloride: 102 mmol/L (ref 98–111)
Chloride: 105 mmol/L (ref 98–111)
Creatinine, Ser: 1.15 mg/dL — ABNORMAL HIGH (ref 0.44–1.00)
Creatinine, Ser: 1.12 mg/dL — ABNORMAL HIGH (ref 0.44–1.00)
GFR calc Af Amer: 54 mL/min — ABNORMAL LOW (ref >60)
GFR calc Af Amer: 56 mL/min — ABNORMAL LOW (ref >60)
GFR calc non Af Amer: 47 mL/min — ABNORMAL LOW (ref >60)
GFR calc non Af Amer: 48 mL/min — ABNORMAL LOW (ref >60)
Glucose, Bld: 174 mg/dL — ABNORMAL HIGH (ref 70–99)
Glucose, Bld: 159 mg/dL — ABNORMAL HIGH (ref 70–99)
Potassium: 2.9 mmol/L — ABNORMAL LOW (ref 3.5–5.1)
Potassium: 3 mmol/L — ABNORMAL LOW (ref 3.5–5.1)
Sodium: 156 mmol/L — ABNORMAL HIGH (ref 135–145)
Sodium: 158 mmol/L — ABNORMAL HIGH (ref 135–145)

## 2019-03-03 LAB — GLUCOSE, CAPILLARY
Glucose-Capillary: 115 mg/dL — ABNORMAL HIGH (ref 70–99)
Glucose-Capillary: 141 mg/dL — ABNORMAL HIGH (ref 70–99)
Glucose-Capillary: 152 mg/dL — ABNORMAL HIGH (ref 70–99)
Glucose-Capillary: 152 mg/dL — ABNORMAL HIGH (ref 70–99)
Glucose-Capillary: 157 mg/dL — ABNORMAL HIGH (ref 70–99)
Glucose-Capillary: 178 mg/dL — ABNORMAL HIGH (ref 70–99)

## 2019-03-03 LAB — TROPONIN I: Troponin I: 1.83 ng/mL (ref <0.03)

## 2019-03-03 LAB — PHOSPHORUS: Phosphorus: 3.4 mg/dL (ref 2.5–4.6)

## 2019-03-03 LAB — MAGNESIUM: Magnesium: 2.4 mg/dL (ref 1.7–2.4)

## 2019-03-03 MED ORDER — ASPIRIN EC 81 MG PO TBEC: 81.0000 mg | DELAYED_RELEASE_TABLET | Freq: Every day | ORAL | Status: DC

## 2019-03-03 MED ORDER — ORAL CARE MOUTH RINSE
15.0000 mL | Freq: Two times a day (BID) | OROMUCOSAL | Status: DC
  Administered 2019-03-03: 15 mL via OROMUCOSAL

## 2019-03-03 MED ORDER — CLONIDINE HCL 0.1 MG PO TABS
0.1000 mg | ORAL_TABLET | Freq: Two times a day (BID) | ORAL | Status: DC
  Filled 2019-03-03: qty 1

## 2019-03-03 MED ORDER — AMIODARONE HCL IN DEXTROSE 360-4.14 MG/200ML-% IV SOLN
60.0000 mg/h | INTRAVENOUS | Status: AC
  Administered 2019-03-03 (×2): 60 mg/h via INTRAVENOUS
  Filled 2019-03-03: qty 200

## 2019-03-03 MED ORDER — DILTIAZEM HCL-DEXTROSE 100-5 MG/100ML-% IV SOLN (PREMIX)
5.0000 mg/h | INTRAVENOUS | Status: DC
  Administered 2019-03-03: 5 mg/h via INTRAVENOUS
  Filled 2019-03-03 (×2): qty 100

## 2019-03-03 MED ORDER — AMIODARONE LOAD VIA INFUSION
150.0000 mg | Freq: Once | INTRAVENOUS | Status: AC
  Administered 2019-03-03: 150 mg via INTRAVENOUS
  Filled 2019-03-03: qty 83.34

## 2019-03-03 MED ORDER — POTASSIUM CHLORIDE 10 MEQ/50ML IV SOLN
10.0000 meq | INTRAVENOUS | Status: AC
  Administered 2019-03-03 (×6): 10 meq via INTRAVENOUS
  Filled 2019-03-03 (×3): qty 50

## 2019-03-03 MED ORDER — DILTIAZEM LOAD VIA INFUSION
15.0000 mg | Freq: Once | INTRAVENOUS | Status: AC
  Administered 2019-03-03: 15 mg via INTRAVENOUS
  Filled 2019-03-03: qty 15

## 2019-03-03 MED ORDER — AMIODARONE HCL IN DEXTROSE 360-4.14 MG/200ML-% IV SOLN
30.0000 mg/h | INTRAVENOUS | Status: DC
  Administered 2019-03-03 – 2019-03-06 (×7): 30 mg/h via INTRAVENOUS
  Filled 2019-03-03 (×13): qty 200

## 2019-03-03 MED ORDER — APIXABAN 5 MG PO TABS
5.0000 mg | ORAL_TABLET | Freq: Two times a day (BID) | ORAL | Status: DC
  Administered 2019-03-04 – 2019-03-05 (×3): 5 mg via ORAL
  Filled 2019-03-03 (×3): qty 1

## 2019-03-03 MED ORDER — POTASSIUM CHLORIDE 10 MEQ/50ML IV SOLN
10.0000 meq | INTRAVENOUS | Status: AC
  Administered 2019-03-03 (×4): 10 meq via INTRAVENOUS
  Filled 2019-03-03: qty 50

## 2019-03-03 MED ORDER — DEXMEDETOMIDINE HCL IN NACL 400 MCG/100ML IV SOLN
0.4000 ug/kg/h | INTRAVENOUS | Status: DC
  Administered 2019-03-03: 1 ug/kg/h via INTRAVENOUS
  Administered 2019-03-03: 1.2 ug/kg/h via INTRAVENOUS
  Administered 2019-03-03: 1 ug/kg/h via INTRAVENOUS
  Filled 2019-03-03 (×3): qty 100

## 2019-03-03 NOTE — Progress Notes (Signed)
RT note: unable to perform SBT on patient this AM due to elevated HR of 160s and increased respiratory rate.  Will continue to monitor.

## 2019-03-03 NOTE — Progress Notes (Signed)
[facility] Progress Note Patient Name: Martha Mora DOB: 10-Apr-1946 MRN: [id number]   Date of Service  03/03/2019  HPI/Events of Note  Bradycardia - HR = 55. Likely related to Precedex IV infusion. Request for Posey belt.  eICU Interventions  Will order: 1. Decrease Precedex IV infusion from 1 to 0.5 mcg/kg/hour.  2. Will order Posey belt.      Intervention Category Major Interventions: Arrhythmia - evaluation and management  Katelynn Heidler Eugene 03/03/2019, 9:06 PM

## 2019-03-03 NOTE — Progress Notes (Signed)
[facility] Progress Note Patient Name: Martha Mora DOB: 1946/09/18 MRN: [id number]   Date of Service  03/03/2019  HPI/Events of Note  AFIB with RVR - Ventricular rate = 172. BP = 134/82. Already on Cardizem PO. K+ = 2.9 and Mg++ = 2.4.   eICU Interventions  Will order: 1. Replace K+. 2. Repeat BMP at 2 PM.  3. Amiodarone IV load and infusion.      Intervention Category Major Interventions: Arrhythmia - evaluation and management  Sommer,Steven Eugene 03/03/2019, 6:12 AM

## 2019-03-03 NOTE — Procedures (Signed)
Extubation Procedure Note  Patient Details:   Name: Martha Mora DOB: 11-04-1946 MRN: [id number]   Airway Documentation:    Vent end date: 03/03/19 Vent end time: 1400   Evaluation  O2 sats: stable throughout Complications: No apparent complications Patient did tolerate procedure well. Bilateral Breath Sounds: Clear   Yes   Patient extubated to bipap per MD order.  Positive cuff leak noted.  No evidence of stridor.  Patient able to speak post extubation, prior to placing on mask.  Patient tolerating bipap mask well at this time.  Will continue to monitor.   Judith Part 03/03/2019, 2:10 PM

## 2019-03-03 NOTE — Progress Notes (Signed)
CRITICAL VALUE ALERT  Critical Value:  Troponin 1.83  Date & Time Notied:  03/03/19  1430  Provider Notified: Dr. Loleta Books  Orders Received/Actions taken: No new orders  Marlyne Totaro, Zenovia Jordan, RN

## 2019-03-03 NOTE — Progress Notes (Signed)
Name: LAURINA FISCHL MRN: [id number] DOB: 31-Jul-1946 CC: SOB   LOS: 74  [facility] / Critical Care Note   73 yo adm 4/5  with worsening shortness of breath, fevers, chills and cough as well as foul smelling urine. She continues to smoke E-cigs and has a 80 PPD hx of smoking.   has a past medical history of Adenomatous colon polyp, Anginal pain ([place]), Anxiety, Blood transfusion without reported diagnosis, Breast cancer ([place]), CAD (coronary artery disease), Cataract, Chronic bronchitis ([place]), Clotting disorder ([place]), COPD (chronic obstructive pulmonary disease) ([place]), Depression, Diverticulosis, Duodenitis without hemorrhage, Family history of malignant neoplasm of gastrointestinal tract, GERD (gastroesophageal reflux disease), Heart murmur, Hyperlipemia, Hypertension, Internal hemorrhoids, Myocardial infarction ([place]) (1997), Obesity, OSA (obstructive sleep apnea), Osteoarthritis, Osteopenia (12/27/2011), Osteoporosis, Personal history of radiation therapy (2008), Reflux esophagitis, Restless leg syndrome, and Sleep apnea.   She was admitted to the medical floor on 2 L [place] , CCM called as patient became tachypneic and was requiring escalating amount of oxygen on 11 L NRB. Tx to ICU and intubated. Found to have E. coli septic shock CO VID ruled out   Lines / Drains:  RUE PICC 4/6  ETT 4/6 >>  Cultures:  BC 4/5 e coli  Urine 4/5 >> e coli ////// Blood 4/12 Urine 4/12 Trach 4212  Antibiotics:  rocephin 4/6 >>4/12 .[name]... Vanc 4/12 > Zosyn 4/12 >    EVENTS   CT abdomen 3/5 >>> 11 mm stone left kidey, rt punctate stone, no hnosis US renal   4/8 vent asynchrony  4/9 -  Urine output increased with Lasix, back down on second shift Afebrile Remains critically ill, intubated on low-dose Levophed Severe vent asynchrony on 4/8 requiring increased sedation  4/10 - desaturated on vent at fio2 30%. Seems dysnch with vent. On fent gtt and precedex gtt. ECHO -  The left ventricle has  normal systolic function, with an ejection fraction of 55-60%. The cavity size was normal. Left ventricular diastolic parameters were normal.  2. Inferior basal akinesis.  3. The right ventricle has normal systolc function. The cavity was normal. There is no increase in right ventricular wall thickness.  4. Left atrial size was moderately dilated.  5. Moderate thickening of the mitral valve leaflet. Mild calcification of the mitral valve leaflet. There is moderate mitral annular calcification present.  6. The aortic valve is tricuspid Moderate thickening of the aortic valve Sclerosis without any evidence of stenosis of the aortic valve. Aortic valve regurgitation is trivial by color flow Doppler.  7. The inferior vena cava was dilated in size with <50% respiratory variability.  8. The interatrial septum was not well visualized.  4/11 - on sbt. Hypertensive, agitated despite fent gtt and precedex gtt. Diursed with test dose lasix but still +9L positive since admit. Worsning scleral edema   4/12- still agitated despite med changes yesterday. Continues with fever up and down. Hypretensive and constipated. On fent gtt + precedex gtt + haldol + versed prn + other home meds. Stil with fevere  hypertensive despite cardizem. gEtting aKI with diruesis,. Since yesterday on dilaudid gtt and diprivan gtt with nicotine patch,. On 50% fio2 and calmer but still confused ? Better. Dong PSV  PCT  yesterda pre abx suggestive of localized infection   4/1   SUBJECTIVE/OVERNIGHT/INTERVAL HX    03/03/2019   Doing PSV . Impropved mentation and following commands. But this AM  A Fib RVR and needed amio load and is on cardizem gtt. CXR better .  AKI better after lasix reduction   but hypernatremic 156, Low K repleted. Mag ok    Vital Signs: Temp:  [98.7 F (37.1 C)-100.8 F (38.2 C)] 99.2 F (37.3 C) (04/15 0800) Pulse Rate:  [52-173] 115 (04/15 1030) Resp:  [11-32] 26 (04/15 1030) BP: (89-202)/(36-147) 99/79  (04/15 1030) SpO2:  [88 %-98 %] 92 % (04/15 1030) FiO2 (%):  [40 %-50 %] 40 % (04/15 0800) Weight:  [99.4 kg] 99.4 kg (04/15 0530) I/O last 3 completed shifts: In: 4140.5 [I.V.:834.9; NG/GT:2676.7; IV Piggyback:629] Out: 3660 [Urine:3660]    General Appearance:  Looks criticall ill OBESE - + Head:  Normocephalic, without obvious abnormality, atraumatic Eyes:  PERRL - yes, conjunctiva/corneas - muddy and scleral edema improvd     Ears:  Normal external ear canals, both ears Nose:  G tube - no Throat:  ETT TUBE - yes , OG tube - yes on TF Neck:  Supple,  No enlargement/tenderness/nodules Lungs: Clear to auscultation bilaterally, Ventilator   Synchrony - yes on PSV Heart:  S1 and S2 normal, no murmur, CVP - no.  Pressors - -non Abdomen:  Soft, no masses, no organomegaly Genitalia / Rectal:  Not done Extremities:  Extremities- intact less edema Skin:  ntact in exposed areas . Sacral area - not examined Neurologic:  Sedation - diprivan, dilaudd orn -> RASS - +1 . Moves all 4s - yes. CAM-ICU - seems negative for deliruim . Orientation - followng more commands                 LABS    PULMONARY Recent Labs  Lab 02/27/19 1052  PHART 7.406  PCO2ART 44.7  PO2ART 66.0*  HCO3 27.8  TCO2 29  O2SAT 91.0    CBC Recent Labs  Lab 03/01/19 0500 03/02/19 0409 03/03/19 0434  HGB 8.3* 8.7* 8.7*  HCT 28.6* 30.5* 29.5*  WBC 20.9* 24.1* 24.9*  PLT 199 298 389    COAGULATION Recent Labs  Lab 02/27/19 0236  INR 1.3*    CARDIAC   No results for input(s): TROPONINI in the last 168 hours. No results for input(s): PROBNP in the last 168 hours.   CHEMISTRY Recent Labs  Lab 02/25/19 0410 02/26/19 0420  02/27/19 0236  02/28/19 0130 02/28/19 0500 03/01/19 0500 03/02/19 0409 03/03/19 0434  NA 138 141   < >  --    < > 145 148* 154* 155* 156*  K 4.7 4.4   < >  --    < > 2.8* 3.4* 2.8* 2.9* 2.9*  CL 107 107   < >  --   --  97* 100 102 100 102  CO2 22 25   < >  --    --  34* 33* 40* 43* 44*  GLUCOSE 95 154*   < >  --   --  153* 164* 133* 152* 174*  BUN 52* 51*   < >  --   --  36* 37* 41* 39* 44*  CREATININE 1.58* 1.38*   < >  --   --  0.99 1.06* 1.22* 1.18* 1.15*  CALCIUM 8.2* 9.0   < >  --   --  8.8* 8.9 8.5* 8.7* 8.7*  MG 2.4 2.1  --  1.0*  --   --  3.5* 2.1 2.1 2.4  PHOS 3.7 4.3  --   --   --   --   --   --   --  3.4   < > = values in  this interval not displayed.   Estimated Creatinine Clearance: 43.8 mL/min (A) (by C-G formula based on SCr of 1.15 mg/dL (H)).   LIVER Recent Labs  Lab 02/27/19 0236 02/28/19 0500 03/01/19 0500  AST 40 51* 100*  ALT 30 33 50*  ALKPHOS 170* 129* 151*  BILITOT 0.3 0.4 0.7  PROT 5.9* 5.6* 5.4*  ALBUMIN 2.1* 2.1* 2.2*  INR 1.3*  --   --      INFECTIOUS Recent Labs  Lab 02/28/19 1353 03/01/19 0500 03/02/19 0409  LATICACIDVEN 0.8  --   --   PROCALCITON 1.03 0.99 0.63     ENDOCRINE CBG (last 3)  Recent Labs    03/02/19 2343 03/03/19 0342 03/03/19 0825  GLUCAP 181* 157* 152*         IMAGING x48h  - image(s) personally visualized  -   highlighted in bold Dg Chest Port 1 View  Result Date: 03/03/2019 CLINICAL DATA:  Endotracheal tube present. EXAM: PORTABLE CHEST 1 VIEW COMPARISON:  March 02, 2019 FINDINGS: The right PICC line terminates in the central SVC. The ETT is in good position. The NG tube terminates below today's study. Cardiomegaly. The hila and mediastinum are unremarkable. Mild interstitial prominence. Probable mild atelectasis in the right base. IMPRESSION: 1. Support apparatus as above. 2. Cardiomegaly and mild pulmonary edema. Mild atelectasis in the right base. Electronically Signed   By: Dorise Bullion III M.D   On: 03/03/2019 08:42   Dg Chest Port 1 View  Result Date: 03/02/2019 CLINICAL DATA:  Hypoxia EXAM: PORTABLE CHEST 1 VIEW COMPARISON:  February 28, 2019 FINDINGS: Endotracheal tube tip is 2.4 cm above the carina. Nasogastric tube tip is in the proximal stomach with the  side port above the gastroesophageal junction. Central catheter tip is in the superior vena cava. No pneumothorax. There is cardiomegaly with pulmonary venous hypertension. There are pleural effusions bilaterally. There is atelectatic change in the right base. There is a degree of underlying interstitial edema. Surgical clips are noted in the right axillary region. No bone lesions. No adenopathy evident. IMPRESSION: Tube and catheter positions as described without pneumothorax. Note that the side port of the nasogastric tube is above the diaphragm. Advise advancing nasogastric tube 8-10 cm. Pulmonary vascular congestion with interstitial edema and bilateral pleural effusions. Suspect a degree of underlying congestive heart failure. There may be a degree of ARDS as well. No consolidation. There is right base atelectasis. Electronically Signed   By: Lowella Grip III M.D.   On: 03/02/2019 09:50   Dg Abd Portable 1v  Result Date: 03/02/2019 CLINICAL DATA:  NG tube placement EXAM: PORTABLE ABDOMEN - 1 VIEW COMPARISON:  None. FINDINGS: Nasogastric tube tip and side port project over the stomach. Side port is just beyond the gastroesophageal junction. No dilated bowel visualized. IMPRESSION: Nasogastric tube tip and side port over the stomach. Electronically Signed   By: Ulyses Jarred M.D.   On: 03/02/2019 18:32       Ventilator settings: Vent Mode: PCV FiO2 (%):  [40 %-50 %] 40 % Set Rate:  [10 bmp] 10 bmp PEEP:  [5 cmH20] 5 cmH20 Pressure Support:  [5 cmH20] 5 cmH20 Plateau Pressure:  [17 cmH20-21 cmH20] 21 cmH20    Assessment and Plan: Active Problems:   Pneumonia   CAP (community acquired pneumonia)   Acute respiratory failure with hypoxemia ([place])   ASSESSMENT / PLAN:  PULMONARY A:  Baseline copd nos -FEV1 101 % 12/2016 Acute resp failure due to bacterial sepsis  Severe  vent asynchrony    03/03/2019 - > doing SBT and might be able to get extubate  P:   See CNS Section to  decide on extubation strategy    NEUROLOGIC A:   Agitated /vent asynchrony   03/03/2019  - much improved deliruim. Off dilaudid gttt  P:   Dc diprivan Dc dilaudid gtt from [facility] Start precedex to help with extubatiopn Clonidine since 4/11 Haldol and then prn  (EKG 441msec QTc on 4/12)  Home celexa  Since 4./11 Home trazodone since 4/11 nicotine patch (per duaghter 4/12) Continue requip    CARDIAC A: #Baseline nstemi , known CAD s/p stent RCA 05/2016  Hypertensive at home - on norvasc, coreg, and losaratn  #admit issues  Septic shock - resolved  Echo ef 55% with IW hypokiness s/p heparin IV ending 4/9/2   03/03/2019 -new A Fib RVR needing amio and dilt and K correction  P: ASA 325  Lasix to continue but reduce Add hydralazine po Continue cardizem Might need inpatient cards consult soon - triad to organize    RENAL A:  AKI (baseline creat 0.8mg %). rEnal US ok  03/03/2019\ - AKI with duresis  And getting better but hypernatremic  P:  Dc lasix KC: repletion  GASTROINTESTINAL A:   NPO  P:   ct TFs - hold for potential extubatioon ppi  HEMATOLOGIC A:  Anemia critical illness  P:  - PRBC for hgb </= 6.9gm%    - exceptions are   -  if ACS susepcted/confirmed then transfuse for hgb </= 8.0gm%,  or    -  active bleeding with hemodynamic instability, then transfuse regardless of hemoglobin value   At at all times try to transfuse 1 unit prbc as possible with exception of active hemorrhage      INFECTIOUS A:   E coli UTI with bacteremia and septic shock at admit 02/21/2019 , covid RULED OUT. Renal US ok  03/03/2019 - feve curve better  P:   Await pan culture from 4/12  vanc/zosyn   ENDOCRINE A:   At risk hyperglycemia P:   ssi     Best practices / Disposition: -->Code Status: FULL -->DVT Px:HSQ -->GI Px:PPI -->Diet:  TF - family:   4/8 - daughter updated,    - 4/10 -> d/w daughter and discussed goals -> discussed no CPR. She will  discuss ith patient son / her brother. Touched upoing long term issues of LTAC etc., but right now forcus on CPR discussion . She will talk to her brother/patient son and get back  4/11 - per daughter full code unless irreversible. At her request home cns meds added   -4/12 and 4/13  - daughter [name] Poplin updated (she did video chat earlier wtith RN))  - 4.15  - daughter [name] Poplin updated. Potential extubation 03/03/2019      ATTESTATION & SIGNATURE   The patient Martha Mora is critically ill with multiple organ systems failure and requires high complexity decision making for assessment and support, frequent evaluation and titration of therapies, application of advanced monitoring technologies and extensive interpretation of multiple databases.   Critical Care Time devoted to patient care services described in this note is  30  Minutes. This time reflects time of care of this signee Dr Brand Males. This critical care time does not reflect procedure time, or teaching time or supervisory time of PA/NP/Med student/Med Resident etc but could involve care discussion time     Dr. Brand Males, M.D., [facility].C.P Pulmonary  and Critical Care Medicine Staff Physician [place] [facility] Pager: [phone], If no answer or between  15:00h - 7:00h: call [phone]  03/03/2019 11:12 AM
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
## 2019-03-03 NOTE — Progress Notes (Signed)
Assisted televisit to patient with daughter.

## 2019-03-03 NOTE — Progress Notes (Signed)
Pt's HR increased to >200 and RR increased after cleaning pt up, suctioning, and giving meds per tube. Pt states she is anxious. 2 mg of versed given PRN with no improvement, but pt no longer following commands after interventions. 12- lead EKG showing a fib RVR. Dr. Oletta Darter notified and orders placed. Will continue to monitor.

## 2019-03-03 NOTE — Progress Notes (Signed)
Wasted 80cc of Dilaudid drip in narcotic waste container.  Witnessed by April Lewis RN.  Irven Baltimore, RN

## 2019-03-03 NOTE — Progress Notes (Signed)
Assisted tele visit to patient with son.  Martha Mora Dalia M Zahriyah Joo, RN   

## 2019-03-03 NOTE — Progress Notes (Signed)
Afib rvr with 130s but looking good and signaling desire for extubation  Plan Extubate to bipap If a fib rvr is stil + call cards   dw Dr Despina Hick    Dr. Brand Males, M.D., F.C.C.P,  Pulmonary and Critical Care Medicine Staff Physician, [place] Director - Interstitial Lung Disease  Program  Pulmonary [place] at [place], [place], [address]  Pager: [phone], If no answer or between  15:00h - 7:00h: call [phone] Telephone: [phone]  1:30 PM 03/03/2019

## 2019-03-03 NOTE — Progress Notes (Signed)
RT note: patient taken off of bipap and placed on 5L nasal cannula.  Currently tolerating well.  Will continue to monitor.

## 2019-03-03 NOTE — Progress Notes (Signed)
FPTS Social Progress Note  FPTS following along socially. Appreciate great care by CCM. Will be happy to resume care when patient stable for transfer to floor.   Caroline More, DO 03/03/2019, 9:08 AM PGY-2, [facility] Service pager [phone]

## 2019-03-03 NOTE — Progress Notes (Signed)
Assisted tele visit to patient with daughter.  Kena Limon Anderson, RN   

## 2019-03-03 NOTE — Progress Notes (Signed)
Pt converted to SR.  Informed Dr. Loleta Books.  Cut off Cardizem and left on amio.  Irven Baltimore, RN

## 2019-03-03 NOTE — TOC Initial Note (Signed)
Transition of Care [facility]) - Initial/Assessment Note    Patient Details  Name: Martha Mora MRN: [id number] Date of Birth: 1946-06-18  [facility] CM/SW Contact:    Bartholomew Crews, RN Phone Number: 03/03/2019, 12:34 PM  Clinical Narrative:                 Spoke with patient's son over Elink to discuss transition home. Anticipate extubation today, then may transfer out to floor pending medical stability. Family is hoping to bring patient home with home health services. Daughter, [name], is available to provide 24 hour supervision, and the plan is for patient to stay at [facility] home post discharge. Discussed that PT and OT with evaluate and make recommendations for rehab needs at discharge. Son stated that this would be perferct. Son stated that his sister, [name], is a good contact person for discussing discharge planning since patient will be going to [facility] home. CM to continue to follow for transition of care needs.   Expected Discharge Plan: [place] Barriers to Discharge: Continued Medical Work up   Patient Goals and CMS Choice        Expected Discharge Plan and Services Expected Discharge Plan: [place]                                  Prior Living Arrangements/Services     Patient language and need for interpreter reviewed:: Yes        Need for Family Participation in Patient Care: Yes (Comment) Care giver support system in place?: Yes (comment)   Criminal Activity/Legal Involvement Pertinent to Current Situation/Hospitalization: No - Comment as needed  Activities of Daily Living      Permission Sought/Granted                  Emotional Assessment Appearance:: Appears stated age Attitude/Demeanor/Rapport: Intubated (Following Commands or Not Following Commands)       Psych Involvement: No (comment)  Admission diagnosis:  AKI (acute kidney injury) ([place]) [N17.9] Urinary tract infection without  hematuria, site unspecified [N39.0] Community acquired pneumonia of left lung, unspecified part of lung [J18.9] Sepsis with encephalopathy, due to unspecified organism, unspecified whether septic shock present ([place]) [A41.9, R65.20, G93.40] CAP (community acquired pneumonia) [J18.9] Acute respiratory failure with hypoxia ([place]) [J96.01] Patient Active Problem List   Diagnosis Date Noted  . Acute respiratory failure with hypoxemia ([place]) 02/22/2019  . Pneumonia 02/21/2019  . CAP (community acquired pneumonia) 02/21/2019  . Abnormal urine odor 10/06/2018  . Insomnia 09/08/2016  . Chest pain on exertion 05/23/2016  . Mixed incontinence 10/25/2015  . Other fatigue 10/25/2015  . Restless leg syndrome 05/24/2015  . Stress incontinence 02/02/2015  . Mitral valve regurgitation 05/23/2014  . Dysfunctional uterine bleeding 09/02/2013  . Breast cancer of lower-outer quadrant of right female breast ([place]) 07/24/2013  . Postmenopausal bleeding 07/14/2013  . Osteopenia 12/27/2011  . ANXIETY 07/19/2009  . Osteoarthritis 07/19/2009  . NAUSEA 07/19/2009  . IRRITABLE BOWEL SYNDROME 11/17/2008  . PERSONAL HX COLONIC POLYPS 07/19/2008  . COLONIC POLYPS, ADENOMATOUS 11/17/2007  . CARCINOMA IN SITU OF BREAST 11/17/2007  . Hyperlipidemia 11/17/2007  . Obesity, unspecified 11/17/2007  . Depression 11/17/2007  . Essential hypertension 11/17/2007  . Coronary atherosclerosis 11/17/2007  . HEMORRHOIDS, INTERNAL 11/17/2007  . GERD 11/17/2007  . PEPTIC STRICTURE 11/17/2007  . SLEEP APNEA 11/17/2007  . Diverticulosis of colon (without  mention of hemorrhage) 09/05/2005  . REFLUX ESOPHAGITIS 06/01/2002  . ESOPHAGEAL STRICTURE 06/01/2002   PCP:  [name], DO Pharmacy:   [id number] - [place], [place] [address] [place] [address] Phone: [phone] Fax: [phone]  [place], [place] [facility] [place] [address] [place] [address] [place] [address] Phone: [phone] Fax: [phone]     Social Determinants of Health (SDOH) Interventions    Readmission Risk Interventions No flowsheet data found.

## 2019-03-04 ENCOUNTER — Inpatient Hospital Stay (HOSPITAL_COMMUNITY): Payer: Medicare Other

## 2019-03-04 ENCOUNTER — Encounter (HOSPITAL_COMMUNITY): Payer: Self-pay | Admitting: Pulmonary Disease

## 2019-03-04 DIAGNOSIS — N390 Urinary tract infection, site not specified: Secondary | ICD-10-CM

## 2019-03-04 DIAGNOSIS — N3000 Acute cystitis without hematuria: Secondary | ICD-10-CM

## 2019-03-04 DIAGNOSIS — G9340 Encephalopathy, unspecified: Secondary | ICD-10-CM

## 2019-03-04 DIAGNOSIS — J9602 Acute respiratory failure with hypercapnia: Secondary | ICD-10-CM

## 2019-03-04 DIAGNOSIS — R6520 Severe sepsis without septic shock: Secondary | ICD-10-CM

## 2019-03-04 DIAGNOSIS — A419 Sepsis, unspecified organism: Secondary | ICD-10-CM

## 2019-03-04 LAB — CBC WITH DIFFERENTIAL/PLATELET
Abs Immature Granulocytes: 0.42 10*3/uL — ABNORMAL HIGH (ref 0.00–0.07)
Basophils Absolute: 0.1 10*3/uL (ref 0.0–0.1)
Basophils Relative: 1 %
Eosinophils Absolute: 0.1 10*3/uL (ref 0.0–0.5)
Eosinophils Relative: 0 %
HCT: 30.2 % — ABNORMAL LOW (ref 36.0–46.0)
Hemoglobin: 8.8 g/dL — ABNORMAL LOW (ref 12.0–15.0)
Immature Granulocytes: 2 %
Lymphocytes Relative: 12 %
Lymphs Abs: 2.8 10*3/uL (ref 0.7–4.0)
MCH: 26.6 pg (ref 26.0–34.0)
MCHC: 29.1 g/dL — ABNORMAL LOW (ref 30.0–36.0)
MCV: 91.2 fL (ref 80.0–100.0)
Monocytes Absolute: 1.1 10*3/uL — ABNORMAL HIGH (ref 0.1–1.0)
Monocytes Relative: 5 %
Neutro Abs: 19.2 10*3/uL — ABNORMAL HIGH (ref 1.7–7.7)
Neutrophils Relative %: 80 %
Platelets: 458 10*3/uL — ABNORMAL HIGH (ref 150–400)
RBC: 3.31 MIL/uL — ABNORMAL LOW (ref 3.87–5.11)
RDW: 17.4 % — ABNORMAL HIGH (ref 11.5–15.5)
WBC: 23.8 10*3/uL — ABNORMAL HIGH (ref 4.0–10.5)
nRBC: 0.4 % — ABNORMAL HIGH (ref 0.0–0.2)

## 2019-03-04 LAB — BASIC METABOLIC PANEL
Anion gap: 13 (ref 5–15)
Anion gap: 13 (ref 5–15)
BUN: 33 mg/dL — ABNORMAL HIGH (ref 8–23)
BUN: 29 mg/dL — ABNORMAL HIGH (ref 8–23)
CO2: 37 mmol/L — ABNORMAL HIGH (ref 22–32)
CO2: 35 mmol/L — ABNORMAL HIGH (ref 22–32)
Calcium: 9 mg/dL (ref 8.9–10.3)
Calcium: 9.1 mg/dL (ref 8.9–10.3)
Chloride: 110 mmol/L (ref 98–111)
Chloride: 108 mmol/L (ref 98–111)
Creatinine, Ser: 1.06 mg/dL — ABNORMAL HIGH (ref 0.44–1.00)
Creatinine, Ser: 1.1 mg/dL — ABNORMAL HIGH (ref 0.44–1.00)
GFR calc Af Amer: 59 mL/min — ABNORMAL LOW (ref >60)
GFR calc Af Amer: 57 mL/min — ABNORMAL LOW (ref >60)
GFR calc non Af Amer: 51 mL/min — ABNORMAL LOW (ref >60)
GFR calc non Af Amer: 49 mL/min — ABNORMAL LOW (ref >60)
Glucose, Bld: 137 mg/dL — ABNORMAL HIGH (ref 70–99)
Glucose, Bld: 206 mg/dL — ABNORMAL HIGH (ref 70–99)
Potassium: 2.8 mmol/L — ABNORMAL LOW (ref 3.5–5.1)
Potassium: 2.9 mmol/L — ABNORMAL LOW (ref 3.5–5.1)
Sodium: 160 mmol/L — ABNORMAL HIGH (ref 135–145)
Sodium: 156 mmol/L — ABNORMAL HIGH (ref 135–145)

## 2019-03-04 LAB — GLUCOSE, CAPILLARY
Glucose-Capillary: 120 mg/dL — ABNORMAL HIGH (ref 70–99)
Glucose-Capillary: 113 mg/dL — ABNORMAL HIGH (ref 70–99)
Glucose-Capillary: 148 mg/dL — ABNORMAL HIGH (ref 70–99)
Glucose-Capillary: 137 mg/dL — ABNORMAL HIGH (ref 70–99)

## 2019-03-04 LAB — PHOSPHORUS: Phosphorus: 3.2 mg/dL (ref 2.5–4.6)

## 2019-03-04 LAB — MAGNESIUM: Magnesium: 2.4 mg/dL (ref 1.7–2.4)

## 2019-03-04 LAB — TROPONIN I
Troponin I: 2.18 ng/mL (ref <0.03)
Troponin I: 1.82 ng/mL (ref <0.03)

## 2019-03-04 MED ORDER — POTASSIUM CHLORIDE 10 MEQ/50ML IV SOLN
10.0000 meq | INTRAVENOUS | Status: AC
  Administered 2019-03-04 (×4): 10 meq via INTRAVENOUS
  Filled 2019-03-04 (×4): qty 50

## 2019-03-04 MED ORDER — CITALOPRAM HYDROBROMIDE 20 MG PO TABS
40.0000 mg | ORAL_TABLET | Freq: Every day | ORAL | Status: DC
  Administered 2019-03-04 – 2019-03-10 (×7): 40 mg via ORAL
  Filled 2019-03-04 (×7): qty 2

## 2019-03-04 MED ORDER — FUROSEMIDE 10 MG/ML IJ SOLN
40.0000 mg | Freq: Two times a day (BID) | INTRAMUSCULAR | Status: DC
  Administered 2019-03-04 – 2019-03-07 (×6): 40 mg via INTRAVENOUS
  Filled 2019-03-04 (×6): qty 4

## 2019-03-04 MED ORDER — PANTOPRAZOLE SODIUM 40 MG PO TBEC
40.0000 mg | DELAYED_RELEASE_TABLET | Freq: Every day | ORAL | Status: DC
  Administered 2019-03-04 – 2019-03-10 (×7): 40 mg via ORAL
  Filled 2019-03-04 (×7): qty 1

## 2019-03-04 MED ORDER — ORAL CARE MOUTH RINSE
15.0000 mL | Freq: Two times a day (BID) | OROMUCOSAL | Status: DC
  Administered 2019-03-04 – 2019-03-10 (×7): 15 mL via OROMUCOSAL

## 2019-03-04 MED ORDER — TRAZODONE HCL 50 MG PO TABS
50.0000 mg | ORAL_TABLET | Freq: Every day | ORAL | Status: DC
  Administered 2019-03-04 – 2019-03-05 (×2): 50 mg via ORAL
  Filled 2019-03-04 (×2): qty 1

## 2019-03-04 MED ORDER — CITALOPRAM HYDROBROMIDE 10 MG/5ML PO SOLN: 40.0000 mg | Freq: Every day | ORAL | Status: DC

## 2019-03-04 MED ORDER — POTASSIUM CHLORIDE 10 MEQ/50ML IV SOLN
10.0000 meq | INTRAVENOUS | Status: AC
  Administered 2019-03-04 (×6): 10 meq via INTRAVENOUS
  Filled 2019-03-04 (×6): qty 50

## 2019-03-04 MED ORDER — ADULT MULTIVITAMIN W/MINERALS CH
1.0000 | ORAL_TABLET | Freq: Every day | ORAL | Status: DC
  Administered 2019-03-05 – 2019-03-10 (×6): 1 via ORAL
  Filled 2019-03-04 (×6): qty 1

## 2019-03-04 MED ORDER — PANTOPRAZOLE SODIUM 40 MG PO PACK: 40.0000 mg | PACK | Freq: Every day | ORAL | Status: DC

## 2019-03-04 MED ORDER — ONDANSETRON HCL 4 MG/2ML IJ SOLN
4.0000 mg | Freq: Four times a day (QID) | INTRAMUSCULAR | Status: DC | PRN
  Administered 2019-03-04: 15:00:00 4 mg via INTRAVENOUS
  Filled 2019-03-04: qty 2

## 2019-03-04 MED ORDER — POTASSIUM CHLORIDE 2 MEQ/ML IV SOLN
INTRAVENOUS | Status: DC
  Administered 2019-03-04 – 2019-03-05 (×4): via INTRAVENOUS
  Filled 2019-03-04 (×7): qty 1000

## 2019-03-04 MED ORDER — FUROSEMIDE 10 MG/ML IJ SOLN
40.0000 mg | Freq: Once | INTRAMUSCULAR | Status: AC
  Administered 2019-03-04: 40 mg via INTRAVENOUS
  Filled 2019-03-04: qty 4

## 2019-03-04 MED ORDER — ROSUVASTATIN CALCIUM 20 MG PO TABS
20.0000 mg | ORAL_TABLET | Freq: Every day | ORAL | Status: DC
  Administered 2019-03-05 – 2019-03-10 (×6): 20 mg via ORAL
  Filled 2019-03-04 (×6): qty 1

## 2019-03-04 MED ORDER — HYDRALAZINE HCL 20 MG/ML IJ SOLN
10.0000 mg | INTRAMUSCULAR | Status: DC | PRN
  Administered 2019-03-04: 08:00:00 10 mg via INTRAVENOUS
  Filled 2019-03-04 (×3): qty 1

## 2019-03-04 MED ORDER — IOHEXOL 300 MG/ML  SOLN
80.0000 mL | Freq: Once | INTRAMUSCULAR | Status: AC | PRN
  Administered 2019-03-04: 16:00:00 80 mL via INTRAVENOUS

## 2019-03-04 MED ORDER — LOSARTAN POTASSIUM 50 MG PO TABS
50.0000 mg | ORAL_TABLET | Freq: Every day | ORAL | Status: DC
  Administered 2019-03-04 – 2019-03-10 (×7): 50 mg via ORAL
  Filled 2019-03-04 (×7): qty 1

## 2019-03-04 MED ORDER — DEXTROSE 5 % IV SOLN: INTRAVENOUS | Status: DC

## 2019-03-04 MED ORDER — SODIUM CHLORIDE 0.9 % IV SOLN
1.0000 g | INTRAVENOUS | Status: DC
  Administered 2019-03-04: 13:00:00 1 g via INTRAVENOUS
  Filled 2019-03-04: qty 10

## 2019-03-04 NOTE — Progress Notes (Addendum)
IV team consult placed to assess PICC line.  Pt's primary RN, Judie Petit, reported that PICC line dressing and statlock were removed by patient - time approx 2200.  Site was cleaned by primary RN and redressed.  Upon arrival, PICC line noted to be pulled to approx 10-12cm mark.  RN notified that PICC could no longer be used.  PICC nurse notified - will address for possible PICC exchange on 4/17. EA RN

## 2019-03-04 NOTE — Progress Notes (Signed)
Patient to come to [facility] 4/17 at 0700.  Received Sign out from PCCM.  Briefly, patient was initially admitted for sepsis and intubated the day of admission on 4/5.  She was extubated on 4/15.  Found to have E. coli bacteremia, placed on ceftriaxone and received 6 days of this, continued to have fevers and was brought into thank/Zosyn.  Repeat blood cultures were no growth.  Patient received 5 days of Vanco and Zosyn, and has been transitioned back to ceftriaxone today.  CT abdomen and pelvis also ordered to assess for cysts renal stone or abscess given that patient has continued to fever despite adequate treatment of E. coli bacteremia.  Patient also has new onset A. fib and is on amiodarone, cardiology has been consulted.  Patient has been off Precedex since this morning.  She remains hypoxic on 6 L, but is improving with diuresis.  Patient does not have a home O2 requirement.  Family medicine team appreciates the excellent care provided to this patient by CCM and is happy to take over care at 7 AM on 4/17.  [place], DO

## 2019-03-04 NOTE — Progress Notes (Signed)
[facility] Progress Note Patient Name: Martha Mora DOB: 1946/11/01 MRN: [id number]   Date of Service  03/04/2019  HPI/Events of Note  K+ = 2.8 and Creatinine = 1.06.  eICU Interventions  Will order: 1. Replace K+. 2. Repeat BMP at 2 PM.      Intervention Category Major Interventions: Electrolyte abnormality - evaluation and management  Tyshon Fanning [place] 03/04/2019, 6:43 AM
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
## 2019-03-04 NOTE — Evaluation (Signed)
Occupational Therapy Evaluation Patient Details Name: Martha Mora MRN: [id number] DOB: [birth year] Today's Date: 03/04/2019    History of Present Illness Pt adm with SOB, fever, and foul smelling urine. Pt found to have E coli UTI with septic shock and PNA. COVID 19 Ruled out. Pt intubated 4/6 and extubated 4/15. Pt also developed afib with RVR. PMH - copd, breast CA, CAD, MI, OA   Clinical Impression   PTA: pt was living alone. Currently, pt plans to d/c home with daughter providing 24/7 care. Pt's daughter reports that pt was restless at baseline. Pt performing sit to stands with minguardA. Pt able to tolerate standing ~2 mins at a time. Pt performing grooming with set-upA. Pt following 1 step commands well. Pt unaware of deficits as she takes off her O2 randomly as she states "I don't need that." Pt did not participate in LB ADL so maxA overall. Pt would greatly benefit from continued OT skilled services for ADL, mobility and safety in [facility] setting. OT to follow acutely.    Follow Up Recommendations  Home health OT;Supervision/Assistance - 24 hour    Equipment Recommendations  3 in 1 bedside commode    Recommendations for Other Services       Precautions / Restrictions Precautions Precautions: Fall Restrictions Weight Bearing Restrictions: No      Mobility Bed Mobility Overal bed mobility: Needs Assistance Bed Mobility: Supine to Sit     Supine to sit: Min assist     General bed mobility comments: sitting in chair with abdomen restraint.  Transfers Overall transfer level: Needs assistance Equipment used: Rolling walker (2 wheeled) Transfers: Sit to/from [facility] Sit to Stand: Min guard Stand pivot transfers: Min assist;+2 safety/equipment       General transfer comment: -Verbal cues for hand placement. Pivoted from bed to chair with stand pivot with walker    Balance Overall balance assessment: Needs assistance Sitting-balance support: No  upper extremity supported;Feet unsupported Sitting balance-Leahy Scale: Fair     Standing balance support: Bilateral upper extremity supported Standing balance-Leahy Scale: Poor Standing balance comment: walker and min guard for static standing; pt would not perform marching in standing.                           ADL either performed or assessed with clinical judgement   ADL Overall ADL's : Needs assistance/impaired Eating/Feeding: Set up;Sitting   Grooming: Supervision/safety;Set up;Sitting   Upper Body Bathing: Minimal assistance;Sitting   Lower Body Bathing: Maximal assistance;Sitting/lateral leans;Sit to/from stand   Upper Body Dressing : Minimal assistance;Sitting   Lower Body Dressing: Maximal assistance;Sitting/lateral leans;Sit to/from stand   Toilet Transfer: Minimal assistance;+2 for physical assistance;+2 for safety/equipment;BSC   Toileting- Clothing Manipulation and Hygiene: Maximal assistance;Sitting/lateral lean       Functional mobility during ADLs: Minimal assistance;+2 for physical assistance;Rolling walker(minA+1 for sit to stand) General ADL Comments: Pt generally minA for UB ADL and maxA for LB ADL.     Vision Baseline Vision/History: Wears glasses Wears Glasses: At all times Vision Assessment?: No apparent visual deficits     Perception     Praxis      Pertinent Vitals/Pain Pain Assessment: No/denies pain     Hand Dominance Right   Extremity/Trunk Assessment Upper Extremity Assessment Upper Extremity Assessment: Generalized weakness   Lower Extremity Assessment Lower Extremity Assessment: Defer to PT evaluation;Generalized weakness   Cervical / Trunk Assessment Cervical / Trunk Assessment: Normal   Communication Communication  Communication: No difficulties   Cognition Arousal/Alertness: Awake/alert Behavior During Therapy: Anxious;Impulsive Overall Cognitive Status: Impaired/Different from baseline Area of Impairment:  Safety/judgement;Problem solving                         Safety/Judgement: Decreased awareness of safety;Decreased awareness of deficits   Problem Solving: Requires verbal cues     General Comments  pt able to follow 1 step commands and describe bathroom and per chart, pt was correct.    Exercises     Shoulder Instructions      Home Living Family/patient expects to be discharged to:: Private residence Living Arrangements: Alone;Children(Pt to stay with her daughter [name] at [place]) Available Help at Discharge: Family;Available 24 hours/day Type of Home: House Home Access: Stairs to enter [employer] of Steps: 3 Entrance Stairs-Rails: Right Home Layout: One level     Bathroom Shower/Tub: Occupational psychologist: Standard     Home Equipment: Environmental consultant - 2 wheels   Additional Comments: Daughter will double check about walker      Prior Functioning/Environment Level of Independence: Independent                 OT Problem List: Decreased strength;Decreased activity tolerance;Impaired balance (sitting and/or standing);Decreased cognition;Decreased safety awareness;Pain      OT Treatment/Interventions: Self-care/ADL training;Therapeutic exercise;Neuromuscular education;Energy conservation;Therapeutic activities;Patient/family education;Balance training    OT Goals(Current goals can be found in the care plan section) Acute Rehab OT Goals Patient Stated Goal: go home OT Goal Formulation: With patient Time For Goal Achievement: 03/18/19 Potential to Achieve Goals: Good ADL Goals Pt Will Perform Grooming: with modified independence;standing Pt Will Perform Upper Body Dressing: with modified independence;sitting Pt Will Perform Lower Body Dressing: with supervision;sit to/from stand Pt Will Transfer to Toilet: with modified independence;ambulating;regular height toilet Additional ADL Goal #1: Pt will perform static standing balance activity x4  mins in prep for ADL Additional ADL Goal #2: Pt will perform ADL functional mobility  a household distance with RW and fair balance.  OT Frequency: Min 3X/week   Barriers to D/C:            Co-evaluation              AM-PAC OT "6 Clicks" Daily Activity     Outcome Measure Help from another person eating meals?: A Lot Help from another person taking care of personal grooming?: A Little Help from another person toileting, which includes using toliet, bedpan, or urinal?: Total Help from another person bathing (including washing, rinsing, drying)?: A Lot Help from another person to put on and taking off regular upper body clothing?: A Lot Help from another person to put on and taking off regular lower body clothing?: Total 6 Click Score: 11   End of Session Equipment Utilized During Treatment: Gait belt;Rolling walker Nurse Communication: Mobility status  Activity Tolerance: Treatment limited secondary to medical complications (Comment);Patient tolerated treatment well Patient left: in chair;with call bell/phone within reach;with restraints reapplied(at abdomen)  OT Visit Diagnosis: Muscle weakness (generalized) (M62.81)                Time: 4132-4401 OT Time Calculation (min): 41 min Charges:  OT General Charges $OT Visit: 1 Visit OT Evaluation $OT Eval Moderate Complexity: 1 Mod OT Treatments $Self Care/Home Management : 8-22 mins $Neuromuscular Re-education: 8-22 mins  Ebony Hail Harold Hedge) Marsa Aris OTR/L Acute Rehabilitation Services Pager: [phone] Office: [place] 03/04/2019, 12:13 PM

## 2019-03-04 NOTE — Discharge Summary (Addendum)
[facility] Discharge Summary  Patient name: Martha Mora Medical record number: [id number] Date of birth: [birth year] Age: 73 y.o. Gender: female Date of Admission: 02/21/2019  Date of Discharge: 03/10/2019 Admitting Physician: Kandice Hams, MD  Primary Care Provider: [name], DO Consultants: CCM  Indication for Hospitalization: SOB and anxiety   Discharge Diagnoses/Problem List:  E coli bacteremia 2/2 UTI, obstructing left renal stone A fib Acute diastolic CHF Diarrhea: resolved Acute hypoxemic respiratory failure AKI Hypokalemia COPD HTN HLD CAD Anemia Restless leg syndrome Anxiety/insomnia   Disposition: home  Discharge Condition: improving  Discharge Exam:  General: awake and alert, sitting in recliner watching television, NAD  Cardiovascular: RRR, no MRG  Respiratory: CTAB, no wheezes, rales, or rhonchi. No increased WOB. Speaking full sentences. No accessory muscle use.  Abdomen: soft, non tender, non distended, bowel sounds present  Extremities: no edema, non tender   Brief Hospital Course:  Martha Mora is a 73 y.o. female who presented for admission on 02/21/19 for SOB and fever and diagnosed initially with urosepsis. Patient initially admitted and treated for likely urosepsis and possible CAP with azithromycin and CTX. On day of admission patient become acutely hypoxic requiring more O2 and then HFNC. Despite HFNC patient continued to be hypoxic and was transferred to ICU care for intubation for possible COVID infection.   R/o COVID Given rapid decline of respiratory status patient was considered COVID PUI and intubated.Initial lab work showing elevated d dimer, elevated CRP, ferritin 45, and lymphopenia.  While in ICU care patient started on plaquenil as well as continued azithromycin from admission. Patient began to have QTc prolongation and plaquenil was held. COVID test resulted while patient still intubated and patient  found to be COVID negative. Acute respiratory failure felt to be 2/2 bacterial sepsis given negative COVID. On 4/15 patient able to tolerate extubation.   E.coli Bacteremia During admission blood cultures drawn and 4/4 BC grew E.coli. Source likely from E. coli in urine. Patient at that time already on azithromycin and rocephin. Patient became hypotensive in the ICU requiring pressor support for BP maintenance. Patient continued on rocephin for a total of 5 days. CT abd/pelvis on 4/16 showing 9x14x69mm left UPJ stone causing mild/mod left hydronephrosis and evidence of obstructive uropathy. Likely infection proximal to the stone. Urology was consulted and recommended ureteral stent placement, which was performed on 4/20.  She was then transitioned from CTX to keflex and per urology recommendation, should remain on keflex for 14 days.  Patient with allergy to Keflex so Bactrim x2 weeks was chosen instead. She will follow up with urology as outpatient and likely have ureteroscopy as outpatient.  Elevated troponins While in ICU care patient with elevated troponins trended at 4.44>2.65>1.5>1.65>1.83>2.18>1.82. EKG showing concern for ACS vs. NSTEM and heparin gtt started. Patient transitioned to ASA after 48hrs. Echo performed showing EF 55% with hypokinesis. Patient with h/o drug eluding stent in 2017. She was diuresed due to fluid overload after fluid resuscitation in ICU.  She was transitioned to PO Lasix and discharged on 40mg  daily.  ASA was discussed with cardiologist in the setting of patient also being on Eliquis, it was determined that ASA be held. Per cardiologist dc on eliquis alone.   Afib Patient with new onset Afib with RVR while intubated. CHADSVASC of 5. Patient started on Amiodarone as well as diltiazem gtt. Eliquis as anticoagulation. Converted to sinus rhythm on 4/15 and transitioned of diltiazem gtt. Patient was then transitioned to amiodarone gtt.  Patient was transitioned off amiodarone gtt  on 4/18.  She will continue amiodarone 200mg  BID x 1 week, and then decrease to 200mg  QD per cardiology reocmmendations.  Issues for Follow Up:  1. Ensure outpatient follow up with urology. 2. She should be on amiodarone 200mg  BID until 4/26.  On 4/26 she should begin taking QD. 3. Increase eliquis to 5 mg on 4/22 4. BP regimen: amlodipine and coreg discontinued on admission. Discussed with cardiology, recommended holding on dc and restarting as outpatient as BP tolerates  5. Nocturnal O2 given as patient has desat during sleep, will likely need sleep study in future.  Significant Procedures:  02/22/19 - intubation 02/22/19 - PICC line insertion, requiring pressors  03/03/19 - extubation 03/08/19 - ureteral stent placement  Significant Labs and Imaging:  Recent Labs  Lab 03/08/19 0426 03/09/19 0527 03/10/19 0253  WBC 17.5* 18.4* 15.0*  HGB 9.1* 8.9* 9.6*  HCT 30.2* 29.8* 31.8*  PLT 410* 394 425*   Recent Labs  Lab 03/04/19 0424  03/05/19 0832 03/06/19 0545 03/07/19 0530 03/08/19 0426 03/09/19 0527  NA 160*   < > 149* 143 141 140 140  K 2.8*   < > 3.9 3.3* 3.1* 3.6 3.7  CL 110   < > 103 99 100 103 103  CO2 37*   < > 33* 33* 32 29 25  GLUCOSE 137*   < > 168* 151* 132* 118* 143*  BUN 33*   < > 23 18 14 14 14   CREATININE 1.06*   < > 1.12* 0.87 0.86 0.84 0.96  CALCIUM 9.0   < > 9.0 8.9 8.9 8.9 9.1  MG 2.4  --   --   --  2.1  --   --   PHOS 3.2  --   --   --   --   --   --    < > = values in this interval not displayed.     Ct Abdomen Pelvis W Wo Contrast  Result Date: 03/04/2019 CLINICAL DATA:  Abdominal pain and fever.  Abscess suspected. EXAM: CT ABDOMEN AND PELVIS WITHOUT AND WITH CONTRAST TECHNIQUE: Multidetector CT imaging of the abdomen and pelvis was performed following the standard protocol before and following the bolus administration of intravenous contrast. CONTRAST:  92mL OMNIPAQUE IOHEXOL 300 MG/ML  SOLN COMPARISON:  01/21/2019 FINDINGS: Lower chest: Patchy  airspace disease both lower lungs suggest multifocal pneumonia. Small bilateral pleural effusions evident. Enlargement of the pulmonary arteries suggests pulmonary arterial hypertension. Hepatobiliary: Insert normal infused liver gallbladder surgically absent. No intrahepatic or extrahepatic biliary dilation. Pancreas: No focal mass lesion. No dilatation of the main duct. No intraparenchymal cyst. No peripancreatic edema. Spleen: No splenomegaly. No focal mass lesion. Adrenals/Urinary Tract: No adrenal nodule or mass. Precontrast imaging shows a 2 mm nonobstructing stone in the lower pole the right kidney. Right ureter unremarkable. 9 x 14 x 9 mm stone is identified in the left renal pelvis (best seen coronal image 83/series 11) with mild to moderate left hydronephrosis. Decreased perfusion to the left kidney is compatible with obstructive uropathy. Probable 12 mm cyst lower pole left kidney, similar to prior. Left ureter unremarkable. The urinary bladder appears normal for the degree of distention. Stomach/Bowel: Stomach is decompressed. Duodenum is normally positioned as is the ligament of Treitz. No small bowel wall thickening. No small bowel dilatation. Colon is nondilated. Rectal tube evident. Vascular/Lymphatic: There is abdominal aortic atherosclerosis without aneurysm. There is no gastrohepatic or hepatoduodenal ligament lymphadenopathy. No intraperitoneal  or retroperitoneal lymphadenopathy. No pelvic sidewall lymphadenopathy. Reproductive: The uterus is surgically absent. There is no adnexal mass. Other: No intraperitoneal free fluid. Musculoskeletal: Small umbilical hernia contains only fat. Subcutaneous edema noted lateral right abdominal wall. Status post ORIF left proximal femur. Stable small sclerotic focus right femoral head, likely benign. No worrisome lytic or sclerotic osseous abnormality. IMPRESSION: 1. 9 x 14 x 9 mm left UPJ stone causes mild to moderate left hydronephrosis and evidence of  obstructive uropathy. Given the history of fever, infection proximal to the stone is a consideration. 2. Subcutaneous edema identified lateral right abdominal wall. Given the asymmetry, cellulitis cannot be excluded. 3. Patchy airspace disease in both lower lobes with small bilateral pleural effusions. Multifocal pneumonia consideration. 4. Enlargement of the main pulmonary arteries suggests pulmonary arterial hypertension. 5.  Aortic Atherosclerois (ICD10-170.0) Electronically Signed   By: Misty Stanley M.D.   On: 03/04/2019 21:20   Dg Retrograde Pyelogram  Result Date: 03/08/2019 CLINICAL DATA:  Left ureteral stent placement EXAM: INTRAOPERATIVE LEFT RETROGRADE UROGRAPHY TECHNIQUE: Images were obtained with the C-arm fluoroscopic device intraoperatively and submitted for interpretation post-operatively. Please see the procedural report for the amount of contrast and the fluoroscopy time utilized. COMPARISON:  None. FINDINGS: Images demonstrate cannulation of the left ureteral orifice and contrast filling the collecting system. There is a filling defect at the ureteropelvic junction. There is a large filling defect within the renal pelvis. Final image demonstrates a stent extending from the left renal collecting system to the bladder. IMPRESSION: Left ureteral stent placement. Electronically Signed   By: Marybelle Killings M.D.   On: 03/08/2019 13:03   US Renal  Result Date: 02/27/2019 CLINICAL DATA:  Initial evaluation for acute renal failure, UTI. EXAM: RENAL / URINARY TRACT ULTRASOUND COMPLETE COMPARISON:  Prior CT from 01/21/2019 FINDINGS: Right Kidney: Renal measurements: 10.4 x 5.5 x 6.5 cm. Echogenicity within normal limits. No mass or hydronephrosis visualized. Left Kidney: Not visualized. Bladder: Bladder decompressed with a Foley catheter in place. IMPRESSION: 1. Technically limited exam due to body habitus and patient's inability to cooperate with the study. 2. Normal right kidney without  hydronephrosis. 3. Nonvisualization of the left kidney. Electronically Signed   By: Jeannine Boga M.D.   On: 02/27/2019 07:37   Dg Chest Port 1 View  Result Date: 03/05/2019 CLINICAL DATA:  PICC line placement. EXAM: PORTABLE CHEST 1 VIEW COMPARISON:  03/04/2019 FINDINGS: 2156 hours. Right PICC line tip is overlying the mid SVC level. Marked interval improvement and bilateral airspace disease seen previously with only minimal residual in the right base currently. Probable tiny bilateral pleural effusions. Cardiopericardial silhouette is at upper limits of normal for size. The visualized bony structures of the thorax are intact. Telemetry leads overlie the chest. IMPRESSION: 1. Marked interval decrease in bilateral diffuse airspace opacity. 2. Right PICC line tip overlies the mid SVC level. Electronically Signed   By: Misty Stanley M.D.   On: 03/05/2019 21:33   Dg Chest Port 1 View  Result Date: 03/04/2019 CLINICAL DATA:  Hypoxia EXAM: PORTABLE CHEST 1 VIEW COMPARISON:  03/03/2019 FINDINGS: Endotracheal and NG tubes removed. Right PICC is stable. Heterogeneous opacities scattered throughout both lungs are worse. Low lung volumes. No pneumothorax. IMPRESSION: Extubated. Worsening bilateral airspace disease. Electronically Signed   By: Marybelle Killings M.D.   On: 03/04/2019 10:53   Dg Chest Port 1 View  Result Date: 03/03/2019 CLINICAL DATA:  Endotracheal tube present. EXAM: PORTABLE CHEST 1 VIEW COMPARISON:  March 02, 2019 FINDINGS: The  right PICC line terminates in the central SVC. The ETT is in good position. The NG tube terminates below today's study. Cardiomegaly. The hila and mediastinum are unremarkable. Mild interstitial prominence. Probable mild atelectasis in the right base. IMPRESSION: 1. Support apparatus as above. 2. Cardiomegaly and mild pulmonary edema. Mild atelectasis in the right base. Electronically Signed   By: Dorise Bullion III M.D   On: 03/03/2019 08:42   Dg Chest Port 1  View  Result Date: 03/02/2019 CLINICAL DATA:  Hypoxia EXAM: PORTABLE CHEST 1 VIEW COMPARISON:  February 28, 2019 FINDINGS: Endotracheal tube tip is 2.4 cm above the carina. Nasogastric tube tip is in the proximal stomach with the side port above the gastroesophageal junction. Central catheter tip is in the superior vena cava. No pneumothorax. There is cardiomegaly with pulmonary venous hypertension. There are pleural effusions bilaterally. There is atelectatic change in the right base. There is a degree of underlying interstitial edema. Surgical clips are noted in the right axillary region. No bone lesions. No adenopathy evident. IMPRESSION: Tube and catheter positions as described without pneumothorax. Note that the side port of the nasogastric tube is above the diaphragm. Advise advancing nasogastric tube 8-10 cm. Pulmonary vascular congestion with interstitial edema and bilateral pleural effusions. Suspect a degree of underlying congestive heart failure. There may be a degree of ARDS as well. No consolidation. There is right base atelectasis. Electronically Signed   By: Lowella Grip III M.D.   On: 03/02/2019 09:50   Dg Chest Port 1 View  Result Date: 02/28/2019 CLINICAL DATA:  Possible COVID-19.  Endotracheal tube EXAM: PORTABLE CHEST 1 VIEW COMPARISON:  02/27/2019 FINDINGS: Endotracheal tube in good position. Right arm PICC tip in the SVC unchanged. NG in the stomach Cardiac enlargement. Progression of bilateral airspace disease diffusely, left greater than right. Small bilateral effusions have progressed. Progression of left lower lobe consolidation. IMPRESSION: Significant progression of diffuse bilateral airspace disease left greater than right which may represent pneumonia or edema. Progression of bilateral effusions. Support lines remain in good position and unchanged. Electronically Signed   By: Franchot Gallo M.D.   On: 02/28/2019 08:10   Dg Chest Port 1 View  Result Date: 02/27/2019 CLINICAL  DATA:  Intubated EXAM: PORTABLE CHEST 1 VIEW COMPARISON:  Chest radiograph from one day prior. FINDINGS: Endotracheal tube tip is 3.2 cm above the carina. Enteric tube enters stomach with the tip not seen on this image. Right PICC terminates in middle third of the SVC. Surgical clips overlie the right axilla. Stable cardiomediastinal silhouette with mild to moderate cardiomegaly. No pneumothorax. No pleural effusion. Diffuse hazy parahilar lung opacities appears slightly improved. IMPRESSION: 1. Well-positioned support structures. 2. Stable cardiomegaly. 3. Slight improvement in diffuse hazy parahilar lung opacities, suggesting improving pulmonary edema. Electronically Signed   By: Ilona Sorrel M.D.   On: 02/27/2019 07:58   Dg Chest Port 1 View  Result Date: 02/26/2019 CLINICAL DATA:  Acute respiratory failure.  Ventilator support. EXAM: PORTABLE CHEST 1 VIEW COMPARISON:  For a 2020 FINDINGS: Endotracheal tube tip 2.5 cm above the carina. Orogastric or nasogastric tube enters the abdomen. Right arm PICC tip in the SVC above the right atrium. Slight increased aeration in the lower lobes. Widespread patchy pulmonary infiltrates persist and show slight worsening. IMPRESSION: Better aeration in both lower lobes. However, there is also a pattern of worsening patchy airspace density within both lungs which seems to indicate worsening infiltrate. Electronically Signed   By: Nelson Chimes M.D.   On:  02/26/2019 06:19   Dg Chest Port 1 View  Result Date: 02/24/2019 CLINICAL DATA:  Acute respiratory failure EXAM: PORTABLE CHEST 1 VIEW COMPARISON:  02/23/2019 FINDINGS: Right PICC line, endotracheal tube and NG tube are unchanged. Cardiomegaly with vascular congestion. Bilateral lower lobe airspace opacities and layering effusions. IMPRESSION: Layering bilateral effusions with bibasilar atelectasis or infiltrates. Cardiomegaly, vascular congestion. Electronically Signed   By: Rolm Baptise M.D.   On: 02/24/2019 08:21   Dg  Chest Port 1 View  Result Date: 02/23/2019 CLINICAL DATA:  Check endotracheal tube EXAM: PORTABLE CHEST 1 VIEW COMPARISON:  02/22/2019 FINDINGS: Cardiac shadow is prominent but stable. The endotracheal tube lies at the level of the carina directed towards right mainstem bronchus and should be withdrawn 2-3 cm. Gastric catheter extends into the stomach. Increasing right basilar density with associated effusion is seen. Slight improved aeration in the left base is noted. Postsurgical changes in the right axilla are seen. No bony abnormality is noted. Right-sided PICC line is noted in satisfactory position. IMPRESSION: Endotracheal tube directed towards the right mainstem bronchus. This should be withdrawn 2-3 cm. Increasing right basilar opacity with associated effusion. Some improved aeration in the left base is noted. Electronically Signed   By: Inez Catalina M.D.   On: 02/23/2019 08:11   Dg Chest Port 1 View  Result Date: 02/22/2019 CLINICAL DATA:  Respiratory failure EXAM: PORTABLE CHEST 1 VIEW COMPARISON:  02/21/2019 FINDINGS: Endotracheal tube placed. Tip is 1.5 cm from the carina. Left basilar consolidation is stable. Right lung is under aerated and clear. No pneumothorax or pleural effusion. IMPRESSION: Stable left basilar consolidation. Endotracheal tube placed. Electronically Signed   By: Marybelle Killings M.D.   On: 02/22/2019 07:56   Dg Chest Portable 1 View  Result Date: 02/21/2019 CLINICAL DATA:  Cough, tremors for 1 hour EXAM: PORTABLE CHEST 1 VIEW COMPARISON:  CT chest 01/05/2019, chest x-ray 12/12/2017 FINDINGS: There is hazy left lower lobe airspace disease concerning for pneumonia. There is no pleural effusion or pneumothorax. The heart and mediastinal contours are unremarkable. The osseous structures are unremarkable. IMPRESSION: Hazy left lower lobe airspace disease concerning for pneumonia. Electronically Signed   By: Kathreen Devoid   On: 02/21/2019 14:30   Dg Abd Portable 1v  Result Date:  03/02/2019 CLINICAL DATA:  NG tube placement EXAM: PORTABLE ABDOMEN - 1 VIEW COMPARISON:  None. FINDINGS: Nasogastric tube tip and side port project over the stomach. Side port is just beyond the gastroesophageal junction. No dilated bowel visualized. IMPRESSION: Nasogastric tube tip and side port over the stomach. Electronically Signed   By: Ulyses Jarred M.D.   On: 03/02/2019 18:32   Dg Abd Portable 1v  Result Date: 02/22/2019 CLINICAL DATA:  Status post OG tube placement. EXAM: PORTABLE ABDOMEN - 1 VIEW COMPARISON:  None. FINDINGS: OG tube is in place and looped in the distal stomach, likely at the pylorus, with the tip projecting slightly retrograde. IMPRESSION: As above. Electronically Signed   By: Inge Rise M.D.   On: 02/22/2019 10:33   [place] Ekg Site Rite  Result Date: 03/05/2019 If Site Rite image not attached, placement could not be confirmed due to current cardiac rhythm.  [place] Ekg Site Rite  Result Date: 02/22/2019 If Site Rite image not attached, placement could not be confirmed due to current cardiac rhythm.   Results/Tests Pending at Time of Discharge:  Unresulted Labs (From admission, onward)    Start     Ordered   03/06/19 0500  CBC  Daily,   R    Question:  Specimen collection method  Answer:  Unit=Unit collect   03/05/19 1607           Discharge Medications:  Allergies as of 03/10/2019      Reactions   Keflex [cephalexin] Other (See Comments)   "Made me feel funny"   Scallops [shellfish Allergy] Nausea And Vomiting   Penicillins Rash, Other (See Comments)   03/01/19 tolerated Zosyn Red bumps all over stomach Has patient had a PCN reaction causing immediate rash, facial/tongue/throat swelling, SOB or lightheadedness with hypotension: Yes Has patient had a PCN reaction causing severe rash involving mucus membranes or skin necrosis: No Has patient had a PCN reaction that required hospitalization: No Has patient had a PCN reaction occurring within the last 10 years:  No If all of the above answers are "NO", then may proceed with Cephalosporin use.      Medication List    STOP taking these medications   amLODipine 5 MG tablet Commonly known as:  NORVASC   aspirin 325 MG tablet   carvedilol 3.125 MG tablet Commonly known as:  COREG     TAKE these medications   acetaminophen 500 MG tablet Commonly known as:  TYLENOL Take 500 mg by mouth every 6 (six) hours as needed for mild pain, fever or headache.   albuterol 108 (90 Base) MCG/ACT inhaler Commonly known as:  Ventolin HFA 1-2 inhalations every 4-6 hours as needed for wheezing. Dispense spacer as needed. What changed:    how much to take  how to take this  when to take this  additional instructions   amiodarone 200 MG tablet Commonly known as:  PACERONE Take 1 tablet (200 mg total) by mouth 2 (two) times daily. Until 4/26, then take 1 tablet once daily.   apixaban 5 MG Tabs tablet Commonly known as:  ELIQUIS Take 1 tablet (5 mg total) by mouth 2 (two) times daily.   Calcium Carbonate-Vitamin D 600-400 MG-UNIT tablet Take 1 tablet by mouth 2 (two) times daily.   citalopram 40 MG tablet Commonly known as:  CELEXA Take 0.5 tablets (20 mg total) by mouth 2 (two) times daily. What changed:    how much to take  when to take this   diclofenac 50 MG EC tablet Commonly known as:  VOLTAREN Take 50-75 mg by mouth See admin instructions. Take 1 tablet (75mg  totally) by mouth in the morning; take 50 mg by mouth on the evening   Fish Oil 1000 MG Caps Take 500 mg by mouth daily.   furosemide 40 MG tablet Commonly known as:  LASIX Take 1 tablet (40 mg total) by mouth daily.   HEALTHY EYES/LUTEIN PO Take 1 capsule by mouth daily.   losartan 100 MG tablet Commonly known as:  COZAAR Take 100 mg by mouth daily.   multivitamin tablet Take 1 tablet by mouth daily. Herbal Life multivitamin   nicotine 21 mg/24hr patch Commonly known as:  NICODERM CQ - dosed in mg/24 hours Place 1  patch (21 mg total) onto the skin daily.   omeprazole 40 MG capsule Commonly known as:  PRILOSEC TAKE 1 CAPSULE (40 MG TOTAL) BY MOUTH DAILY. What changed:  See the new instructions.   oxybutynin 5 MG tablet Commonly known as:  DITROPAN Take 1 tablet (5 mg total) by mouth daily. What changed:  when to take this   Prolia 60 MG/ML Sosy injection Generic drug:  denosumab Inject into the skin every 6 (six)  months.   rOPINIRole 3 MG tablet Commonly known as:  REQUIP Take 1 tablet (3 mg total) by mouth at bedtime.   rosuvastatin 20 MG tablet Commonly known as:  CRESTOR Take 20 mg by mouth at bedtime.   sulfamethoxazole-trimethoprim 800-160 MG tablet Commonly known as:  BACTRIM DS Take 1 tablet by mouth every 12 (twelve) hours for 14 days.   traZODone 50 MG tablet Commonly known as:  DESYREL TAKE 1 TABLET (50 MG TOTAL) BY MOUTH AT BEDTIME AS NEEDED FOR SLEEP. What changed:  when to take this   trimethoprim 100 MG tablet Commonly known as:  TRIMPEX Take 1 tablet (100 mg total) by mouth daily. USE AFTER YOU FINISH 14 days of BACTRIM.            Durable Medical Equipment  (From admission, onward)         Start     Ordered   03/10/19 0737  For home use only DME oxygen  Once    Question Answer Comment  Mode or (Route) Nasal cannula   Frequency Continuous (stationary and portable oxygen unit needed)   Oxygen delivery system Gas      03/10/19 0737   03/09/19 1513  For home use only DME oxygen  Once    Question Answer Comment  Mode or (Route) Nasal cannula   Liters per Minute 1   Frequency Only at night (stationary unit needed)   Oxygen delivery system Gas      03/09/19 1512   03/09/19 1456  For home use only DME Walker rolling  Once    Question:  Patient needs a walker to treat with the following condition  Answer:  Weakness acquired in ICU   03/09/19 1455   03/08/19 1119  For home use only DME Bedside commode  Once    Question:  Patient needs a bedside commode to  treat with the following condition  Answer:  Weakness acquired in ICU   03/08/19 1118          Discharge Instructions: Please refer to Patient Instructions section of EMR for full details.  Patient was counseled important signs and symptoms that should prompt return to medical care, changes in medications, dietary instructions, activity restrictions, and follow up appointments.   Follow-Up Appointments: Follow-up Information    [name], DO Follow up.   Specialty:  Family Medicine Why:  You have a telemedicine appointment scheduled for 4/27 at 11:10 am.  Someone will call you from our office at that time. Contact information: [address] [address] [phone]        [name], MD. Schedule an appointment as soon as possible for a visit in 1 week(s).   Specialty:  Cardiology Contact information: [place] [address] [phone]        [facility] Follow up.   Why:  bedside commode       [facility], [facility] .   Contact information: [address] [place] [address] [place] [facility].   Why:  Physical and occupational therapy.          Glenn Dale, DO 03/10/2019, 5:01 PM PGY-1, [place]

## 2019-03-04 NOTE — Evaluation (Signed)
Physical Therapy Evaluation Patient Details Name: Martha Mora MRN: [id number] DOB: 15-Sep-1946 Today's Date: 03/04/2019   History of Present Illness  Pt adm with SOB, fever, and foul smelling urine. Pt found to have E coli UTI with septic shock and PNA. COVID 19 Ruled out. Pt intubated 4/6 and extubated 4/15. Pt also developed afib with RVR. PMH - copd, breast CA, CAD, MI, OA  Clinical Impression  Pt presents to PT with decr mobility due to deconditioning and respiratory status. Expect pt will make good progress with mobility and be able to return to daughter's home with daughter's assist. Spoke with daughter via phone.     Follow Up Recommendations Home health PT;Supervision/Assistance - 24 hour    Equipment Recommendations  None recommended by PT(if pt has rolling walker already)    Recommendations for Other Services       Precautions / Restrictions Precautions Precautions: Fall Restrictions Weight Bearing Restrictions: No      Mobility  Bed Mobility Overal bed mobility: Needs Assistance Bed Mobility: Supine to Sit     Supine to sit: Min assist     General bed mobility comments: Assist to elevate trunk into sitting and bring hips to EOB  Transfers Overall transfer level: Needs assistance Equipment used: Rolling walker (2 wheeled) Transfers: Sit to/from [facility] Sit to Stand: Min guard Stand pivot transfers: Min assist;+2 safety/equipment       General transfer comment: Assist for lines and safety. Verbal cues for hand placement. Pivoted from bed to chair with stand pivot with walker  Ambulation/Gait Ambulation/Gait assistance: Min assist;+2 safety/equipment Gait Distance (Feet): 5 Feet Assistive device: Rolling walker (2 wheeled) Gait Pattern/deviations: Step-through pattern;Decreased step length - right;Decreased step length - left;Trunk flexed Gait velocity: decr Gait velocity interpretation: <1.31 ft/sec, indicative of household  ambulator General Gait Details: Assist for balance. Verbal cues to stand more erect. Pt fatigues quickly with SpO2 to 86% on 6L of O2.   Stairs            Wheelchair Mobility    Modified Rankin (Stroke Patients Only)       Balance Overall balance assessment: Needs assistance Sitting-balance support: No upper extremity supported;Feet unsupported Sitting balance-Leahy Scale: Fair     Standing balance support: Bilateral upper extremity supported Standing balance-Leahy Scale: Poor Standing balance comment: walker and min guard for static standing                             Pertinent Vitals/Pain Pain Assessment: No/denies pain    Home Living Family/patient expects to be discharged to:: Private residence Living Arrangements: Alone;Children(Pt to stay with her daughter [name] at [place]) Available Help at Discharge: Family;Available 24 hours/day Type of Home: House Home Access: Stairs to enter Entrance Stairs-Rails: Right Entrance Stairs-Number of Steps: 3 Home Layout: One level Home Equipment: Walker - 2 wheels Additional Comments: Daughter will double check about walker    Prior Function Level of Independence: Independent               Hand Dominance        Extremity/Trunk Assessment   Upper Extremity Assessment Upper Extremity Assessment: Defer to OT evaluation    Lower Extremity Assessment Lower Extremity Assessment: Generalized weakness       Communication   Communication: No difficulties  Cognition Arousal/Alertness: Awake/alert Behavior During Therapy: Anxious;Impulsive Overall Cognitive Status: Impaired/Different from baseline Area of Impairment: Safety/judgement;Problem solving  Safety/Judgement: Decreased awareness of safety;Decreased awareness of deficits   Problem Solving: Requires verbal cues        General Comments      Exercises     Assessment/Plan    PT Assessment Patient needs  continued PT services  PT Problem List Decreased strength;Decreased activity tolerance;Decreased balance;Decreased mobility;Decreased safety awareness;Cardiopulmonary status limiting activity;Obesity       PT Treatment Interventions DME instruction;Gait training;Functional mobility training;Therapeutic activities;Therapeutic exercise;Balance training;Patient/family education    PT Goals (Current goals can be found in the Care Plan section)  Acute Rehab PT Goals Patient Stated Goal: go home PT Goal Formulation: With patient/family Time For Goal Achievement: 03/18/19 Potential to Achieve Goals: Good    Frequency Min 3X/week   Barriers to discharge Inaccessible home environment stairs to enter daughter's home    Co-evaluation               AM-PAC PT "6 Clicks" Mobility  Outcome Measure Help needed turning from your back to your side while in a flat bed without using bedrails?: None Help needed moving from lying on your back to sitting on the side of a flat bed without using bedrails?: A Little Help needed moving to and from a bed to a chair (including a wheelchair)?: A Little Help needed standing up from a chair using your arms (e.g., wheelchair or bedside chair)?: A Little Help needed to walk in hospital room?: A Little Help needed climbing 3-5 steps with a railing? : A Lot 6 Click Score: 18    End of Session Equipment Utilized During Treatment: Gait belt;Oxygen(used waist restraint as gait belt) Activity Tolerance: Patient limited by fatigue Patient left: in chair;with call bell/phone within reach;with restraints reapplied Nurse Communication: Mobility status PT Visit Diagnosis: Unsteadiness on feet (R26.81);Other abnormalities of gait and mobility (R26.89);Muscle weakness (generalized) (M62.81)    Time: 3662-9476 PT Time Calculation (min) (ACUTE ONLY): 24 min   Charges:   PT Evaluation $PT Eval Moderate Complexity: 1 Mod PT Treatments $Gait Training: 8-22 mins         [place] Pager [phone] Office [place] 03/04/2019, 10:32 AM

## 2019-03-04 NOTE — Progress Notes (Signed)
Fellows Progress Note Patient Name: Martha Mora DOB: [birth year] MRN: [id number]   Date of Service  03/04/2019  HPI/Events of Note  Hypertension - BP = 181/62. Patient NPO.   eICU Interventions  Will order: 1. Hydralazine 10 mg IV Q 4 hours PRN SBP > 170 or DBP > 100.     Intervention Category Major Interventions: Hypertension - evaluation and management  Sommer,Steven Eugene 03/04/2019, 6:27 AM

## 2019-03-04 NOTE — Care Management (Signed)
Spoke to patient's daughter and discussed plan for discharge. She states that she wants her mother to come home with her with [facility]. We discussed the possibility of her going to [facility] (CM was contacted by admissions that patient would qualify). Daughter states that she feels it would be in the best interest for her mom's psyche to be able to return to home and be around family. Discussed HH serices and companies and [facility] ratings, she would like to use [place]. Referral accepted by [facility]. She will need HH orders and RW  prior to DC.  Patient "may have RW at home that was her late spouse's. Referral also placed for [facility] case management to follow after DC.  Poplin,Kerry Daughter [phone]  [phone]

## 2019-03-04 NOTE — Progress Notes (Addendum)
NAME:  Martha Mora, MRN:  [id number], DOB:  1945/12/26, LOS: 81 ADMISSION DATE:  02/21/2019, CONSULTATION DATE:  4/6 REFERRING MD:  FPTS, CHIEF COMPLAINT:  SOB    Brief History   73 y/o F, active smoker, admitted 4/5 with SOB, fevers, chills, cough and foul smelling urine.  Admitted per FPTS.  Developed worsening SOB > transferred to ICU 4/6 and intubated.  Work up found her to have E-Coli UTI, bacteremia with associated septic shock.  COVID NEGATIVE. Extubated on 4/15.    Past Medical History  OSA - not compliant with CPAP  RLS HTN HLD  Murmur  MI  GERD  Depression / Anxiety  Breast Cancer - 2008, s/p radiation therapy Adenomatous polyps  Significant Hospital Events   4/05  Admit  4/06  Tx to ICU  4/08  Vent dyssynchrony  4/10  Desaturation / dyssynchrony, on fent / versed gtt's 4/11  SBT hypertensive, agitated, diuresis  4/12  Agitated despite med changes, fever intermittently, HTN, on cardizem 4/15  Extubated to [place]. AFwRVR > treated with amiodarone   Consults:  PCCM   Procedures:  ETT 4/6 >> 4/15  RUE PICC 4/6 >>   Significant Diagnostic Tests:  CT ABD 4/5 >> 14mm non-obstructing left stone, no hydronephrosis  ECHO 4/10 >> LVEF 55-60%, inferior basal akinesis, LA moderately dilated, moderate thickening of mitral valve leaflet, aortic valve sclerosis  Renal US 4/11 >>   Micro Data:  BCID 4/5 >> E-Coli, enterobacteriaceae species detected  UC 4/5 >> E-Coli >> S- zosyn, ceftriaxone  BC 4/5 >> E-Coli >> S-ceftriaxone, zosyn COVID 4/5 >> negative  Tracheal aspirate 4/12 >> negative BCx2 4/12 >>   Antimicrobials:  Rocephin 4/6 >> 4/11 Vanco 4/12 >> 4/16 Zosyn 4/12 >> 4/16 Rocephin 4/16 >>  Interim history/subjective:  SLP reports pt cleared for meds / ice chips.  Not ready for diet at this point.  Worked with PT, up to chair > desaturated with exertion on 6L  Tmax 99.4, WBC 23.  I/O - 2L UOP in last 24 hours / net negative 417ml in 24h, remains 2L + for admit.   Remains on amiodarone, converted to NSR 70's  Objective   Blood pressure (!) 175/65, pulse 81, temperature 98.7 F (37.1 C), temperature source Oral, resp. rate (!) 27, height 4\' 11"  (1.499 m), weight 98.5 kg, SpO2 91 %.    Vent Mode: BIPAP FiO2 (%):  [40 %-50 %] 50 % Set Rate:  [10 bmp] 10 bmp PEEP:  [5 cmH20-6 cmH20] 6 cmH20 Plateau Pressure:  [12 cmH20] 12 cmH20   Intake/Output Summary (Last 24 hours) at 03/04/2019 0930 Last data filed at 03/04/2019 0800 Gross per 24 hour  Intake 1731.08 ml  Output 2520 ml  Net -788.92 ml   Filed Weights   03/02/19 0500 03/03/19 0530 03/04/19 0450  Weight: 100 kg 99.4 kg 98.5 kg    Examination: General: elderly female sitting up in chair in NAD HEENT: MM pink/moist, [place] O2 Neuro: Awake, alert, oriented, MAE, generalized weakness  CV: s1s2 rrr, SEM 2/6 PULM: even/non-labored, lungs bilaterally with rhonchi, wheezing  JJ:OACZ, non-tender, bsx4 active  Extremities: warm/dry, no edema  Skin: no rashes or lesions  Resolved Hospital Problem list   Septic Shock  Ventilator Dyssynchrony  Agitated Delirium   Assessment & Plan:    E-Coli UTI with Bacteremia  -shock resolved  P: Continue abx per primary  Agree with narrowing  CT ABD repeat pending per primary   Acute Hypoxemic Respiratory Failure  in the setting of Septic Shock  -extubated 4/15 -COVID Negative  COPD  -FEV1 101% 12/2016 -not on home O2 P: Mobilize / OOB Q Shift  Pulmonary hygiene - IS, cough/deep breathing  Follow intermittent CXR  PRN albuterol  Wean O2 off for sats 88-95% SLP following   AFwRVR -new this admit Elevated Troponin  -suspect demand ischemia -CHADS2Vasc5 P: Tele monitoring  Per Primary  Continue ASA, Eliquis  Amiodarone gtt > consider transition   AKI Hypokalemia Hypernatremia  P: Per Primary  Lasix per primary  D5 with KCL per primary   HTN, HLD, CAD s/p RCA Stent P: Per Primary  Crestor   Anemia of Critical Illness P: Trend CBC   Transfuse per ICU guidelines    Best practice:  Diet: NPO x ice chips  Pain/Anxiety/Delirium protocol (if indicated): n/a  VAP protocol (if indicated): n/a  DVT prophylaxis: Eliquis  GI prophylaxis: n/a  Glucose control: n/a  Mobility: As tolerated  Code Status: FULL CODE  Family Communication: Daughter updated per RN, PT.   Disposition: Consider transfer to progressive care. PCCM will be available PRN.    Labs   CBC: Recent Labs  Lab 02/28/19 0500 03/01/19 0500 03/02/19 0409 03/03/19 0434 03/04/19 0424  WBC 20.4* 20.9* 24.1* 24.9* 23.8*  NEUTROABS 15.0* 15.7* 19.4* 20.7* 19.2*  HGB 8.6* 8.3* 8.7* 8.7* 8.8*  HCT 27.5* 28.6* 30.5* 29.5* 30.2*  MCV 85.7 88.0 89.7 91.9 91.2  PLT 282 199 298 389 458*    Basic Metabolic Panel: Recent Labs  Lab 02/26/19 0420  02/28/19 0500 03/01/19 0500 03/02/19 0409 03/03/19 0434 03/03/19 1303 03/04/19 0424  NA 141   < > 148* 154* 155* 156* 158* 160*  K 4.4   < > 3.4* 2.8* 2.9* 2.9* 3.0* 2.8*  CL 107   < > 100 102 100 102 105 110  CO2 25   < > 33* 40* 43* 44* 40* 37*  GLUCOSE 154*   < > 164* 133* 152* 174* 159* 137*  BUN 51*   < > 37* 41* 39* 44* 39* 33*  CREATININE 1.38*   < > 1.06* 1.22* 1.18* 1.15* 1.12* 1.06*  CALCIUM 9.0   < > 8.9 8.5* 8.7* 8.7* 9.0 9.0  MG 2.1   < > 3.5* 2.1 2.1 2.4  --  2.4  PHOS 4.3  --   --   --   --  3.4  --  3.2   < > = values in this interval not displayed.   GFR: Estimated Creatinine Clearance: 47.3 mL/min (A) (by C-G formula based on SCr of 1.06 mg/dL (H)). Recent Labs  Lab 02/28/19 1353 03/01/19 0500 03/02/19 0409 03/03/19 0434 03/04/19 0424  PROCALCITON 1.03 0.99 0.63  --   --   WBC  --  20.9* 24.1* 24.9* 23.8*  LATICACIDVEN 0.8  --   --   --   --     Liver Function Tests: Recent Labs  Lab 02/27/19 0236 02/28/19 0500 03/01/19 0500  AST 40 51* 100*  ALT 30 33 50*  ALKPHOS 170* 129* 151*  BILITOT 0.3 0.4 0.7  PROT 5.9* 5.6* 5.4*  ALBUMIN 2.1* 2.1* 2.2*   No results for  input(s): LIPASE, AMYLASE in the last 168 hours. No results for input(s): AMMONIA in the last 168 hours.  ABG    Component Value Date/Time   PHART 7.406 02/27/2019 1052   PCO2ART 44.7 02/27/2019 1052   PO2ART 66.0 (L) 02/27/2019 1052   HCO3 27.8 02/27/2019 1052  TCO2 29 02/27/2019 1052   ACIDBASEDEF 8.8 (H) 02/22/2019 2305   O2SAT 91.0 02/27/2019 1052     Coagulation Profile: Recent Labs  Lab 02/27/19 0236  INR 1.3*    Cardiac Enzymes: Recent Labs  Lab 03/03/19 1303 03/03/19 2358 03/04/19 0424  TROPONINI 1.83* 2.18* 1.82*    HbA1C: Hemoglobin A1C  Date/Time Value Ref Range Status  11/09/2015 5.3 % Final    CBG: Recent Labs  Lab 03/03/19 1522 03/03/19 1934 03/03/19 2344 03/04/19 0316 03/04/19 0816  GLUCAP 152* 141* 115* 120* 113*    Critical care time: n/a     Noe Gens, NP-C [facility] Pgr: [phone] or if no answer [phone] 03/04/2019, 9:31 AM  Attending MD note  Patient was independently seen and examined, treatment plan was discussed with the  Advance Practice Provider. I agree with the above note by NP Ollis. I have personally reviewed the clinical findings, labs, ECG, imaging studies and management of this patient in detail. I have also reviewed the orders written for this patient which were under my direction. I agree with the documentation, as recorded by the Advance Practice Provider.   Briefly, Martha Mora is a 73 y.o. female With ecoli UTI, septic shock and respiratory failure.   Subjective:feels better but nasal cannula is bothersome  Objective: Vitals:   03/04/19 1543 03/04/19 1600  BP:  (!) 178/66  Pulse:  73  Resp:  (!) 33  Temp: 98.6 F (37 C)   SpO2:  95%    FiO2 (%):  [30 %] 30 % No intake or output data in the 24 hours ending 09/16/18 2358  General:  NAD, pleasant Neuro:  A and O x 3 moving all extremities HEENT:  [place]/AT, No JVD noted, PERRL Cardiovascular:  RRR, no MRG Lungs:  CTAB  Abdomen:  Soft, non-distended Musculoskeletal:  No acute deformity Skin:  Intact, MMM   Impression/Plan: Cont current management as documented above.    This patient is critically ill, requiring high complexity decision making for assessment and plan, frequent evaluation, application of advanced monitoring and extensive interpretations of multiple databases.    Critical Care time devoted to patient care services described in this note is 35  minutes, not including time spent on procedures, teaching or supervising.    Collier Bullock, MD
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
## 2019-03-04 NOTE — Consult Note (Signed)
   [facility] CM Inpatient Consult   03/04/2019  Martha Mora 03/29/1946 [id number]    Referral received from transition of care CM (inpatient) through [place] regarding dx.of Pneumonia/ COPD. Patient has 20% (medium) unplanned readmission risk score.  Chart review and MD notes reveal that [name] a 73 y.o.female with COPD not on home O2, and anxiety who presented with SOB and fever, rigors and dysuria. Her UA showed pyuria and bacteriuria, CXR showed possible pneumonia, had developed hypoxic respiratory failure and hypotension. She was intubated and admitted to the ICU. Given rapid decline of respiratory status, patient was tested and found to be COVID negative. Acute respiratory failure felt to be secondary to bacterial sepsis given negative COVID. On 4/15 patient able to tolerate extubation.   In coordination with transition of care CM, call made and spoke to patient's daughter Martha Mora) and discussed possible discharge needs of patient and [facility] care management services. She was concerned and interested for close follow-up of patient post discharge given her respiratory status.  Per daughter, patient lives alone but states that plan is for patient to stay at her house to recover after discharge. Daughter reports no recent hospitalizations of patient related to COPD/ PNA that she can recall. Identified no other barriers and agreed to have phone calls to follow-up and assess patient's recovery.  Will continue to follow patient's progress and will assign for appropriate [facility] services needed.  Patient's primary care provider is [name], DO with [facility] and listed as providing transition of care.  If patient's post hospital needs change, please place a referral for [facility] community follow-up as appropriate.   Of note, [facility] Care Management services does not replace or interfere with any services that are arranged by transition of care case management or social work.   For questions and additional information, please contact:  Robynn Marcel A. Saba Neuman, BSN, RN-BC [facility] Liaison Cell: [phone]

## 2019-03-05 ENCOUNTER — Inpatient Hospital Stay: Payer: Self-pay

## 2019-03-05 ENCOUNTER — Inpatient Hospital Stay (HOSPITAL_COMMUNITY): Payer: Medicare Other

## 2019-03-05 LAB — BASIC METABOLIC PANEL
Anion gap: 13 (ref 5–15)
BUN: 23 mg/dL (ref 8–23)
CO2: 33 mmol/L — ABNORMAL HIGH (ref 22–32)
Calcium: 9 mg/dL (ref 8.9–10.3)
Chloride: 103 mmol/L (ref 98–111)
Creatinine, Ser: 1.12 mg/dL — ABNORMAL HIGH (ref 0.44–1.00)
GFR calc Af Amer: 56 mL/min — ABNORMAL LOW (ref >60)
GFR calc non Af Amer: 48 mL/min — ABNORMAL LOW (ref >60)
Glucose, Bld: 168 mg/dL — ABNORMAL HIGH (ref 70–99)
Potassium: 3.9 mmol/L (ref 3.5–5.1)
Sodium: 149 mmol/L — ABNORMAL HIGH (ref 135–145)

## 2019-03-05 LAB — CULTURE, BLOOD (ROUTINE X 2)
Culture: NO GROWTH
Culture: NO GROWTH

## 2019-03-05 MED ORDER — HEPARIN (PORCINE) 25000 UT/250ML-% IV SOLN
1900.0000 [IU]/h | INTRAVENOUS | Status: DC
  Administered 2019-03-05 – 2019-03-08 (×6): 1900 [IU]/h via INTRAVENOUS
  Filled 2019-03-05 (×5): qty 250

## 2019-03-05 MED ORDER — SODIUM CHLORIDE 0.9 % IV SOLN
2.0000 g | INTRAVENOUS | Status: DC
  Administered 2019-03-05 – 2019-03-08 (×4): 2 g via INTRAVENOUS
  Filled 2019-03-05 (×4): qty 20

## 2019-03-05 MED ORDER — RAMELTEON 8 MG PO TABS
8.0000 mg | ORAL_TABLET | Freq: Every day | ORAL | Status: DC
  Administered 2019-03-06 – 2019-03-09 (×5): 8 mg via ORAL
  Filled 2019-03-05 (×7): qty 1

## 2019-03-05 MED ORDER — ENSURE ENLIVE PO LIQD
237.0000 mL | ORAL | Status: DC
  Administered 2019-03-05 – 2019-03-08 (×2): 237 mL via ORAL

## 2019-03-05 NOTE — Progress Notes (Signed)
Spoke with Arbie Cookey, RN regarding PICC exchange.  Updated that will be performed after 1900.  Carolee Rota, RN

## 2019-03-05 NOTE — Progress Notes (Signed)
  Speech Language Pathology Treatment: Dysphagia  Patient Details Name: Martha Mora MRN: [id number] DOB: 1946/01/06 Today's Date: 03/05/2019 Time: 0340-3524 SLP Time Calculation (min) (ACUTE ONLY): 11 min  Assessment / Plan / Recommendation Clinical Impression  Pt demonstrates improved vocal quality, cough strength. She has been drinking water at bedside without detriment since yesterday. When observed with single sips she did not exhibit any immediate cough or signs of aspiration. She does continue to exhibit shortness of breath and cannot be coached to drink consecutively, which is likely her baseline behavior given COPD. She is able to masticate graham with a small liquid wash and states her preference is to choose soft foods off of the menu as her dentures are destroyed (the dog ate them). Will defer instrumental testing as pt has demonstrated improvement and has been tolerating thin liquids and advance diet to regular thin and f/u x1 for tolerance. RN aware.   HPI HPI: 73 y.o. female admitted for fever and SOB; dx septic shock from E coli bacteremia, E coli UTI, hypokalemia. Developed respiratory distress, transferred to ICU and intubated 4/6-15. COVID test (-). Chronic conditions include COPD, anxiety, MVR, HTN, GERD, incontinence, HLD, obesity, restless leg syndrome.       SLP Plan  Continue with current plan of care       Recommendations  Diet recommendations: Regular;Thin liquid Liquids provided via: Cup;Straw Medication Administration: Whole meds with liquid Supervision: Patient able to self feed Compensations: Slow rate;Small sips/bites Postural Changes and/or Swallow Maneuvers: Seated upright 90 degrees                Oral Care Recommendations: Oral care BID Follow up Recommendations: None Plan: Continue with current plan of care       GO               Martha Baltimore, MA [place] Pager [phone] Office  [phone]  Lynann Beaver 03/05/2019, 9:22 AM
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
## 2019-03-05 NOTE — Plan of Care (Signed)

## 2019-03-05 NOTE — Progress Notes (Signed)
Peripherally Inserted Central Catheter/Midline Placement  The IV Nurse has discussed with the patient and/or persons authorized to consent for the patient, the purpose of this procedure and the potential benefits and risks involved with this procedure.  The benefits include less needle sticks, lab draws from the catheter, and the patient may be discharged home with the catheter. Risks include, but not limited to, infection, bleeding, blood clot (thrombus formation), and puncture of an artery; nerve damage and irregular heartbeat and possibility to perform a PICC exchange if needed/ordered by physician.  Alternatives to this procedure were also discussed.  Bard Power PICC patient education guide, fact sheet on infection prevention and patient information card has been provided to patient /or left at bedside. PICC exchanged over guidewire.    PICC/Midline Placement Documentation  PICC Triple Lumen 03/05/19 PICC Right Brachial 38 cm 0 cm (Active)  Indication for Insertion or Continuance of Line Vasoactive infusions 03/05/2019  9:16 PM  Exposed Catheter (cm) 0 cm 03/05/2019  9:16 PM  Site Assessment Clean;Dry;Intact 03/05/2019  9:16 PM  Lumen #1 Status Blood return noted;Flushed;Saline locked 03/05/2019  9:16 PM  Lumen #2 Status Blood return noted;Flushed;Saline locked 03/05/2019  9:16 PM  Lumen #3 Status Blood return noted;Flushed;Saline locked 03/05/2019  9:16 PM  Dressing Type Transparent;Occlusive;Securing device 03/05/2019  9:16 PM  Dressing Status Clean;Dry;Intact;Antimicrobial disc in place 03/05/2019  9:16 PM  Line Adjustment (NICU/IV Team Only) No 03/05/2019  9:16 PM  Dressing Intervention New dressing 03/05/2019  9:16 PM  Dressing Change Due 03/12/19 03/05/2019  9:16 PM       Aldona Lento L 03/05/2019, 9:17 PM

## 2019-03-05 NOTE — Discharge Instructions (Addendum)
You have a refill on your antibiotic.  You will start that for prophylaxis after you finish your antibiotics from the hospital.   Information on my medicine - ELIQUIS (apixaban)  This medication education was reviewed with me or my healthcare representative as part of my discharge preparation.   Why was Eliquis prescribed for you? Eliquis was prescribed for you to reduce the risk of forming blood clots that can cause a stroke if you have a medical condition called atrial fibrillation (a type of irregular heartbeat)   What do You need to know about Eliquis ? Take your Eliquis TWICE DAILY - one tablet in the morning and one tablet in the evening with or without food.  It would be best to take the doses about the same time each day.  If you have difficulty swallowing the tablet whole please discuss with your pharmacist how to take the medication safely.  Take Eliquis exactly as prescribed by your doctor and DO NOT stop taking Eliquis without talking to the doctor who prescribed the medication.  Stopping may increase your risk of developing a new clot or stroke.  Refill your prescription before you run out.  After discharge, you should have regular check-up appointments with your healthcare provider that is prescribing your Eliquis.  In the future your dose may need to be changed if your kidney function or weight changes by a significant amount or as you get older.  What do you do if you miss a dose? If you miss a dose, take it as soon as you remember on the same day and resume taking twice daily.  Do not take more than one dose of ELIQUIS at the same time.  Important Safety Information A possible side effect of Eliquis is bleeding. You should call your healthcare provider right away if you experience any of the following: ? Bleeding from an injury or your nose that does not stop. ? Unusual colored urine (red or dark brown) or unusual colored stools (red or black). ? Unusual bruising  for unknown reasons. ? A serious fall or if you hit your head (even if there is no bleeding).  Some medicines may interact with Eliquis and might increase your risk of bleeding or clotting while on Eliquis. To help avoid this, consult your healthcare provider or pharmacist prior to using any new prescription or non-prescription medications, including herbals, vitamins, non-steroidal anti-inflammatory drugs (NSAIDs) and supplements.  This website has more information on Eliquis (apixaban): [email or web address].no.

## 2019-03-05 NOTE — Progress Notes (Signed)
Spoke with Lilia Pro, RN concerning PICC pulled out and needing order to exchange or discontinue. See note from 4/16 concerning PICC pulled out. Will continue to monitor.

## 2019-03-05 NOTE — Progress Notes (Addendum)
Nutrition Follow-up  RD working remotely.  DOCUMENTATION CODES:   Morbid obesity  INTERVENTION:   - Ensure Enlive po q 24 hours, each supplement provides 350 kcal and 20 grams of protein  - Continue MVI with minerals daily  NUTRITION DIAGNOSIS:   Inadequate oral intake related to acute illness as evidenced by NPO status.  Progressing, diet now advanced to Heart Healthy  GOAL:   Patient will meet greater than or equal to 90% of their needs  Progressing  MONITOR:   PO intake, Supplement acceptance, Labs, Weight trends, I & O's  REASON FOR ASSESSMENT:   Consult, Ventilator Enteral/tube feeding initiation and management  ASSESSMENT:   73 yo female admitted on 4/5 with acute respiratory failure due to bacterial sepsis but also COVID-19 rule out. Intubated on 4/6 and transferred to ICU. PMH includes COPD, HTN, CAD s/p PCI.  4/15 - extubated 4/17 - diet advanced to regular textures  Weight down 30 lbs since admission weight. Pt continues to diurese. Overall, weight stable over the last year.  Reviewed RN edema assessment from 4/16. Pt with mild pitting generalized edema and mild pitting edema to BUE and BLE.  Attempted to speak with pt via phone call to room but pt did not answer. RD to order an oral nutrition supplement to aid in meeting kcal and protein needs now that pt's diet has been advanced.  Medications reviewed and include: Lasix 40 mg BID, MVI with minerals daily, Protonix, Senna, IV abx, IV amiodarone, D5 with KCl 40 mEq @ 125 ml/hr  Labs reviewed: sodium 156 (H), potassium 2.9 (L) - being replaced, BUN 29 (H), creatinine 1.10 (H)  UOP: 2470 ml x 24 hours Rectal tube: 500 ml x 24 hours  Diet Order:   Diet Order            Diet Heart Room service appropriate? Yes; Fluid consistency: Thin  Diet effective now              EDUCATION NEEDS:   No education needs have been identified at this time  Skin:  Skin Assessment: Reviewed RN Assessment (no  pressure injuries noted)  Last BM:  03/04/19 type 7 via rectal tube  Height:   Ht Readings from Last 1 Encounters:  02/23/19 4\' 11"  (1.499 m)    Weight:   Wt Readings from Last 1 Encounters:  03/04/19 98.5 kg    Ideal Body Weight:  43 kg  BMI:  Body mass index is 43.86 kg/m.  Estimated Nutritional Needs:   Kcal:  1650-1850  Protein:  85-100 grams  Fluid:  >/= 1.6 L    Gaynell Face, MS, RD, LDN Inpatient Clinical Dietitian Pager: [phone] Weekend/After Hours: [phone]

## 2019-03-05 NOTE — Progress Notes (Signed)
Physical Therapy Treatment Patient Details Name: Martha Mora MRN: [id number] DOB: Jan 20, 1946 Today's Date: 03/05/2019    History of Present Illness Pt adm with SOB, fever, and foul smelling urine. Pt found to have E coli UTI with septic shock and PNA. COVID 19 Ruled out. Pt intubated 4/6 and extubated 4/15. Pt also developed afib with RVR. PMH - copd, breast CA, CAD, MI, OA    PT Comments    Pt agreeable to getting OOB and going out into the hall.  SpO2 fluctuating b/c of poor sensor placement, but with good waveform on 4 L, sats remained overall in the mid to upper 90's.  Emphasis on warm up exercise, transitions to EOB, balance and scooting, sit to stand, gait with regular RW.   Follow Up Recommendations  Home health PT;Supervision/Assistance - 24 hour     Equipment Recommendations  None recommended by PT    Recommendations for Other Services       Precautions / Restrictions Precautions Precautions: Fall Restrictions Weight Bearing Restrictions: No    Mobility  Bed Mobility Overal bed mobility: Needs Assistance Bed Mobility: Supine to Sit     Supine to sit: Min assist     General bed mobility comments: cues for direction and assist for trunk to stabilize and come forward, pt assisting with R UE  Transfers Overall transfer level: Needs assistance Equipment used: None;Rolling walker (2 wheeled) Transfers: Sit to/from [facility] Sit to Stand: Min guard Stand pivot transfers: Min assist;+2 safety/equipment       General transfer comment: cues for hand placement, stability assist with a dynamic task like pivoting.  Ambulation/Gait Ambulation/Gait assistance: Min assist;+2 safety/equipment;+2 physical assistance Gait Distance (Feet): 20 Feet(then additional 12 feet, with RW limited by fatigue.) Assistive device: Rolling walker (2 wheeled) Gait Pattern/deviations: Step-through pattern Gait velocity: decr Gait velocity interpretation: <1.31 ft/sec,  indicative of household ambulator General Gait Details: wide ext. rotated stance with flexed posture and positioned too far away from the [employer]             Wheelchair Mobility    Modified Rankin (Stroke Patients Only)       Balance Overall balance assessment: Needs assistance Sitting-balance support: No upper extremity supported;Feet supported Sitting balance-Leahy Scale: Fair Sitting balance - Comments: `   Standing balance support: Bilateral upper extremity supported Standing balance-Leahy Scale: Poor Standing balance comment: minimal use of the RW for static standing, but moderate use for amb.                            Cognition Arousal/Alertness: Awake/alert Behavior During Therapy: Anxious;Impulsive Overall Cognitive Status: (improving  mentation, not tested formally)                                        Exercises General Exercises - Lower Extremity Heel Slides: AROM;Strengthening;Both;10 reps;Supine(with graded resistance in gross extension.)    General Comments        Pertinent Vitals/Pain Pain Assessment: No/denies pain    Home Living                      Prior Function            PT Goals (current goals can now be found in the care plan section) Acute Rehab PT Goals Patient Stated Goal: go home  PT Goal Formulation: With patient/family Time For Goal Achievement: 03/18/19 Potential to Achieve Goals: Good Progress towards PT goals: Progressing toward goals    Frequency    Min 3X/week      PT Plan Current plan remains appropriate    Co-evaluation              AM-PAC PT "6 Clicks" Mobility   Outcome Measure  Help needed turning from your back to your side while in a flat bed without using bedrails?: None Help needed moving from lying on your back to sitting on the side of a flat bed without using bedrails?: A Little Help needed moving to and from a bed to a chair (including a  wheelchair)?: A Little Help needed standing up from a chair using your arms (e.g., wheelchair or bedside chair)?: A Little Help needed to walk in hospital room?: A Little Help needed climbing 3-5 steps with a railing? : A Lot 6 Click Score: 18    End of Session Equipment Utilized During Treatment: Oxygen Activity Tolerance: Patient limited by fatigue Patient left: in chair;with call bell/phone within reach;with restraints reapplied Nurse Communication: Mobility status PT Visit Diagnosis: Unsteadiness on feet (R26.81);Other abnormalities of gait and mobility (R26.89);Muscle weakness (generalized) (M62.81)     Time: 3794-3276 PT Time Calculation (min) (ACUTE ONLY): 35 min  Charges:  $Gait Training: 8-22 mins $Therapeutic Activity: 8-22 mins                     03/05/2019  Martha Mora, PT Acute Rehabilitation Services [phone]  (pager) [phone]  (office)   Martha Mora 03/05/2019, 12:53 PM

## 2019-03-05 NOTE — Progress Notes (Signed)
RT offered to place pt on CPAP dream station for the night and pt declined stating she feels like she is suffocating with it on. RT made several adjustments to see if she would be comfortable but pt still unable to tolerate. RT will continue to monitor.

## 2019-03-05 NOTE — Consult Note (Signed)
Ref: [name], DO   Subjective:  Remains in sinus rhythm. Respiratory distress at times. No fever.   Objective:  Vital Signs in the last 24 hours: Temp:  [97.8 F (36.6 C)-98.7 F (37.1 C)] 98.1 F (36.7 C) (04/17 0759) Pulse Rate:  [67-83] 67 (04/17 0306) Cardiac Rhythm: Normal sinus rhythm (04/17 0701) Resp:  [17-36] 19 (04/17 0759) BP: (156-188)/(58-84) 156/81 (04/17 0759) SpO2:  [88 %-100 %] 94 % (04/17 0306)  Physical Exam: BP Readings from Last 1 Encounters:  03/05/19 (!) 156/81     Wt Readings from Last 1 Encounters:  03/04/19 98.5 kg    Weight change:  Body mass index is 43.86 kg/m. HEENT: [place]/AT, Eyes-Brown, Conjunctiva-Pale, Sclera-Non-icteric Neck: No JVD, No bruit, Trachea midline. Lungs:  Clearing, Bilateral. Cardiac:  Regular rhythm, normal S1 and S2, no S3. II/VI systolic murmur. Abdomen:  Soft, non-tender. BS present. Extremities:  Trace edema present. No cyanosis. No clubbing. CNS: AxOx3, Cranial nerves grossly intact, moves all 4 extremities.  Skin: Warm and dry.   Intake/Output from previous day: 04/16 0701 - 04/17 0700 In: 1155.2 [I.V.:728.6; IV Piggyback:426.6] Out: 2970 [Urine:2470; Stool:500]    Lab Results: BMET    Component Value Date/Time   NA 149 (H) 03/05/2019 0832   NA 156 (H) 03/04/2019 1430   NA 160 (H) 03/04/2019 0424   NA 141 08/08/2017 1009   NA 140 02/05/2017 0941   NA 142 08/05/2016 1022   K 3.9 03/05/2019 0832   K 2.9 (L) 03/04/2019 1430   K 2.8 (L) 03/04/2019 0424   K 4.0 08/08/2017 1009   K 4.1 02/05/2017 0941   K 4.1 08/05/2016 1022   CL 103 03/05/2019 0832   CL 108 03/04/2019 1430   CL 110 03/04/2019 0424   CO2 33 (H) 03/05/2019 0832   CO2 35 (H) 03/04/2019 1430   CO2 37 (H) 03/04/2019 0424   CO2 25 08/08/2017 1009   CO2 23 02/05/2017 0941   CO2 25 08/05/2016 1022   GLUCOSE 168 (H) 03/05/2019 0832   GLUCOSE 206 (H) 03/04/2019 1430   GLUCOSE 137 (H) 03/04/2019 0424   GLUCOSE 95 08/08/2017 1009   GLUCOSE 120 02/05/2017 0941   GLUCOSE 107 08/05/2016 1022   BUN 23 03/05/2019 0832   BUN 29 (H) 03/04/2019 1430   BUN 33 (H) 03/04/2019 0424   BUN 21.7 08/08/2017 1009   BUN 14.4 02/05/2017 0941   BUN 12.0 08/05/2016 1022   CREATININE 1.12 (H) 03/05/2019 0832   CREATININE 1.10 (H) 03/04/2019 1430   CREATININE 1.06 (H) 03/04/2019 0424   CREATININE 0.8 08/08/2017 1009   CREATININE 0.8 02/05/2017 0941   CREATININE 0.8 08/05/2016 1022   CALCIUM 9.0 03/05/2019 0832   CALCIUM 9.1 03/04/2019 1430   CALCIUM 9.0 03/04/2019 0424   CALCIUM 9.0 08/08/2017 1009   CALCIUM 9.4 02/05/2017 0941   CALCIUM 8.8 08/05/2016 1022   GFRNONAA 48 (L) 03/05/2019 0832   GFRNONAA 49 (L) 03/04/2019 1430   GFRNONAA 51 (L) 03/04/2019 0424   GFRNONAA 82 10/23/2015 1158   GFRAA 56 (L) 03/05/2019 0832   GFRAA 57 (L) 03/04/2019 1430   GFRAA 59 (L) 03/04/2019 0424   GFRAA >89 10/23/2015 1158   CBC    Component Value Date/Time   WBC 23.8 (H) 03/04/2019 0424   RBC 3.31 (L) 03/04/2019 0424   HGB 8.8 (L) 03/04/2019 0424   HGB 12.7 12/10/2018 0844   HGB 12.8 08/08/2017 1009   HCT 30.2 (L) 03/04/2019 0424   HCT  39.8 12/10/2018 0844   HCT 39.8 08/08/2017 1009   PLT 458 (H) 03/04/2019 0424   PLT 297 12/10/2018 0844   MCV 91.2 03/04/2019 0424   MCV 83 12/10/2018 0844   MCV 93.9 08/08/2017 1009   MCH 26.6 03/04/2019 0424   MCHC 29.1 (L) 03/04/2019 0424   RDW 17.4 (H) 03/04/2019 0424   RDW 14.9 12/10/2018 0844   RDW 14.8 (H) 08/08/2017 1009   LYMPHSABS 2.8 03/04/2019 0424   LYMPHSABS 3.2 08/08/2017 1009   MONOABS 1.1 (H) 03/04/2019 0424   MONOABS 1.2 (H) 08/08/2017 1009   EOSABS 0.1 03/04/2019 0424   EOSABS 0.1 08/08/2017 1009   BASOSABS 0.1 03/04/2019 0424   BASOSABS 0.1 08/08/2017 1009   HEPATIC Function Panel Recent Labs    02/27/19 0236 02/28/19 0500 03/01/19 0500  PROT 5.9* 5.6* 5.4*   HEMOGLOBIN A1C No components found for: HGA1C,  MPG CARDIAC ENZYMES Lab Results  Component Value Date    TROPONINI 1.82 (HH) 03/04/2019   TROPONINI 2.18 (HH) 03/03/2019   TROPONINI 1.83 (HH) 03/03/2019   BNP No results for input(s): PROBNP in the last 8760 hours. TSH Recent Labs    10/06/18 1051  TSH 1.450   CHOLESTEROL Recent Labs    10/06/18 1051  CHOL 153    Scheduled Meds: . apixaban  5 mg Oral BID  . Chlorhexidine Gluconate Cloth  6 each Topical Daily  . citalopram  40 mg Oral Daily  . feeding supplement (ENSURE ENLIVE)  237 mL Oral Q24H  . furosemide  40 mg Intravenous BID  . losartan  50 mg Oral Daily  . mouth rinse  15 mL Mouth Rinse BID  . multivitamin with minerals  1 tablet Oral Daily  . nicotine  21 mg Transdermal Daily  . pantoprazole  40 mg Oral Daily  . rOPINIRole  3 mg Oral QHS  . rosuvastatin  20 mg Oral Daily  . senna-docusate  1 tablet Oral BID  . sodium chloride flush  10-40 mL Intracatheter Q12H  . traZODone  50 mg Oral QHS   Continuous Infusions: . amiodarone 30 mg/hr (03/05/19 0528)  . cefTRIAXone (ROCEPHIN)  IV    . dextrose 5 % with kcl 125 mL/hr at 03/05/19 0730   PRN Meds:.acetaminophen, albuterol, hydrALAZINE, ondansetron (ZOFRAN) IV, polyvinyl alcohol, sodium chloride flush  Assessment/Plan: Paroxysmal atrial fibrillation Acute on chronic respiratory failure with hypoxemia Acute left heart diastolic failure-HFpEF E.Coli sepsis CAD S/P stent COPD Obesity OSA Troponin I elevation from demand ischemia Hypokalemia, improved Hypernatremia, improved Type 2 DM, uncontrolled  Continue medical treatment.   LOS: 11 days    [name]  MD  03/05/2019, 10:55 AM

## 2019-03-05 NOTE — Care Management Important Message (Signed)
Important Message  Patient Details  Name: Martha Mora MRN: [id number] Date of Birth: 1946/07/30   Medicare Important Message Given:  Yes    Orbie Pyo 03/05/2019, 2:35 PM
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
## 2019-03-05 NOTE — Progress Notes (Signed)
Family Medicine Teaching Service Daily Progress Note Intern Pager: [phone]  Patient name: Martha Mora Medical record number: [id number] Date of birth: 1946-07-02 Age: 73 y.o. Gender: female  Primary Care Provider: [name], DO Consultants: CCM Code Status: Full   Pt Overview and Major Events to Date:  Admitted to [facility] on 4/5 Transfer of care to CCM of 4/6 and intubated Extubated on 4/15 New onset afib 4/15   Assessment and Plan: [name] is a 73 y.o. female admitted for fever and SOB and diagnosed with urosepsis. Also found to have LLL haziness on CXR.  Her chronic conditions include COPD, anxiety, MVR, HTN, GERD, incontinence, HLD, obesity, restless leg syndrome.   E coli bacteremia 2/2 UTI Urine cx from 4/5 growing >=100,000 colonies E.coli with blood cultures from 4/5 also showing E.coli. Blood cultures from 4/12 showing no growth x4 days. On admission with septic shock, now resolved. S/p rocephin 4/6-4/11, vanc 4/12-4/16, zosyn 4/12-4/16. Now currently on rocephin (4/16 - ). CT on 3/5 showing 60mm non obstructing left stone with no hydronephrosis. Patient has been afebrile since 4/14. Total UOP on 4/16 of 2.470L. CT abd/pelvis on 4/16 showing 9x14x56mm left UPJ stone causing mild/mod left hydronephrosis and evidence of obstructive uropathy. Likely infection proximal to the stone.  -continue rocephin (4/16 - ), can narrow abx given that most recent BCx have been negative  -given new UPJ stone with mild/mod hydronephrosis and obstructive uropathy will plan to consult urology -repeat urine cx pending, will follow   Acute hypoxemic respiratory failure  Resolved. In setting of septic shock. Patient intubated on 4/6 with successful extubation on 4/15. COVID negative. No h/o home O2 use.  -continue PRN albuterol  -continue PRN O2, goal of O2 sat 88-95%  Afib  New onset. Patient also with elevated troponins during ICU admission, 2/2 demand ischemia. CHADSVASC 6. Current HR 67.  H/o CAD s/p RCA stent.  -continuous cardiac monitoring  -continue ASA -continue Eliquis  -continue amiodarone gtt, can transition to PO per cardiology   Acute diastolic CHF No prior history CHF.  EF 4/10 showed EF 55%. -Furosemide 40 mg IV twice a day  -Strict I/Os, daily weights, telemetry  -Daily monitoring renal function -cardiology consulted, appreciate recommendations   AKI Cr of 1.10 on 4/16. BL Cr ~ 0.8.  -monitor on daily BMP  Hypernatremia  Improving. Na of 149. Na of 156 on 4/16 -D5W @125cc /h -lasix 40 bid  Hypokalemia  Resolved. K 3.9. K of 2.9 on 4/16 -replete as needed -follow on daily BMP  COPD Albuterol PRN at home. Smoking history with recent vaping use.  -continue albuterol PRN -encourage smoking cessation   HTN Currently Hypertensive at 162/78. 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 Followed by Dr. [name] (outpatiet cardiology). Placed on aspirin 325 by Dr. [name] during visit in September 2019.  Was on DAPT x 1 year. ASCVD risk or is 21.3%. -holding ASA in setting of eliquis  -continue statin  -cardiology consulted, appreciate recommendations   Anemia Stable. Hgb 8.8 on 4/16. Likely from acute illness -follow on daily CBC  Restless leg syndrome  Home meds: Requip 3mg  qhs -continue requip   Anxiety Home meds: celexa 40mg  daily, trazodone 50mg  qhs PRN -continue home meds    FEN/GI: regular  diet  PPx: Eliquis   Disposition: continued inpatient   Subjective:  Patient states she feels improved. Denies SOB or chest pain. States she would like to work with PT today.   Objective: Temp:  [97.8 F (36.6 C)-98.6 F (37 C)] 98.4 F (36.9 C) (04/17 1126) Pulse Rate:  [67-80] 80 (04/17 1126) Resp:  [17-36] 21 (04/17  1126) BP: (156-188)/(60-81) 156/60 (04/17 1126) SpO2:  [90 %-100 %] 90 % (04/17 1126) Physical Exam: General: awake and alert, laying in bed, [place] in place, sitting up in bed, NAD Cardiovascular: RRR, no MRG Respiratory: CTAB, no wheezes, rales, or rhonchi  Abdomen: soft, non tender, bowel sounds present  Extremities: no edema, non tender   Laboratory: Recent Labs  Lab 03/02/19 0409 03/03/19 0434 03/04/19 0424  WBC 24.1* 24.9* 23.8*  HGB 8.7* 8.7* 8.8*  HCT 30.5* 29.5* 30.2*  PLT 298 389 458*   Recent Labs  Lab 02/27/19 0236  02/28/19 0500 03/01/19 0500  03/04/19 0424 03/04/19 1430 03/05/19 0832  NA  --    < > 148* 154*   < > 160* 156* 149*  K  --    < > 3.4* 2.8*   < > 2.8* 2.9* 3.9  CL  --    < > 100 102   < > 110 108 103  CO2  --    < > 33* 40*   < > 37* 35* 33*  BUN  --    < > 37* 41*   < > 33* 29* 23  CREATININE  --    < > 1.06* 1.22*   < > 1.06* 1.10* 1.12*  CALCIUM  --    < > 8.9 8.5*   < > 9.0 9.1 9.0  PROT 5.9*  --  5.6* 5.4*  --   --   --   --   BILITOT 0.3  --  0.4 0.7  --   --   --   --   ALKPHOS 170*  --  129* 151*  --   --   --   --   ALT 30  --  33 50*  --   --   --   --   AST 40  --  51* 100*  --   --   --   --   GLUCOSE  --    < > 164* 133*   < > 137* 206* 168*   < > = values in this interval not displayed.     Imaging/Diagnostic Tests: Ct Abdomen Pelvis W Wo Contrast  Result Date: 03/04/2019 CLINICAL DATA:  Abdominal pain and fever.  Abscess suspected. EXAM: CT ABDOMEN AND PELVIS WITHOUT AND WITH CONTRAST TECHNIQUE: Multidetector CT imaging of the abdomen and pelvis was performed following the standard protocol before and following the bolus administration of intravenous contrast. CONTRAST:  58mL OMNIPAQUE IOHEXOL 300 MG/ML  SOLN COMPARISON:  01/21/2019 FINDINGS: Lower chest: Patchy airspace disease both lower lungs suggest multifocal pneumonia. Small bilateral pleural effusions evident. Enlargement of the pulmonary arteries suggests pulmonary arterial  hypertension. Hepatobiliary: Insert normal infused liver gallbladder surgically absent. No intrahepatic or extrahepatic biliary dilation. Pancreas: No focal mass lesion. No dilatation of the main duct. No intraparenchymal cyst. No peripancreatic edema. Spleen: No splenomegaly. No focal mass lesion. Adrenals/Urinary Tract: No adrenal nodule or mass. Precontrast imaging shows a 2 mm nonobstructing stone in the lower pole the right kidney. Right ureter unremarkable. 9 x 14 x 9 mm stone is identified in the  left renal pelvis (best seen coronal image 83/series 11) with mild to moderate left hydronephrosis. Decreased perfusion to the left kidney is compatible with obstructive uropathy. Probable 12 mm cyst lower pole left kidney, similar to prior. Left ureter unremarkable. The urinary bladder appears normal for the degree of distention. Stomach/Bowel: Stomach is decompressed. Duodenum is normally positioned as is the ligament of Treitz. No small bowel wall thickening. No small bowel dilatation. Colon is nondilated. Rectal tube evident. Vascular/Lymphatic: There is abdominal aortic atherosclerosis without aneurysm. There is no gastrohepatic or hepatoduodenal ligament lymphadenopathy. No intraperitoneal or retroperitoneal lymphadenopathy. No pelvic sidewall lymphadenopathy. Reproductive: The uterus is surgically absent. There is no adnexal mass. Other: No intraperitoneal free fluid. Musculoskeletal: Small umbilical hernia contains only fat. Subcutaneous edema noted lateral right abdominal wall. Status post ORIF left proximal femur. Stable small sclerotic focus right femoral head, likely benign. No worrisome lytic or sclerotic osseous abnormality. IMPRESSION: 1. 9 x 14 x 9 mm left UPJ stone causes mild to moderate left hydronephrosis and evidence of obstructive uropathy. Given the history of fever, infection proximal to the stone is a consideration. 2. Subcutaneous edema identified lateral right abdominal wall. Given the  asymmetry, cellulitis cannot be excluded. 3. Patchy airspace disease in both lower lobes with small bilateral pleural effusions. Multifocal pneumonia consideration. 4. Enlargement of the main pulmonary arteries suggests pulmonary arterial hypertension. 5.  Aortic Atherosclerois (ICD10-170.0) Electronically Signed   By: Misty Stanley M.D.   On: 03/04/2019 21:20   US Renal  Result Date: 02/27/2019 CLINICAL DATA:  Initial evaluation for acute renal failure, UTI. EXAM: RENAL / URINARY TRACT ULTRASOUND COMPLETE COMPARISON:  Prior CT from 01/21/2019 FINDINGS: Right Kidney: Renal measurements: 10.4 x 5.5 x 6.5 cm. Echogenicity within normal limits. No mass or hydronephrosis visualized. Left Kidney: Not visualized. Bladder: Bladder decompressed with a Foley catheter in place. IMPRESSION: 1. Technically limited exam due to body habitus and patient's inability to cooperate with the study. 2. Normal right kidney without hydronephrosis. 3. Nonvisualization of the left kidney. Electronically Signed   By: Jeannine Boga M.D.   On: 02/27/2019 07:37   Dg Chest Port 1 View  Result Date: 03/04/2019 CLINICAL DATA:  Hypoxia EXAM: PORTABLE CHEST 1 VIEW COMPARISON:  03/03/2019 FINDINGS: Endotracheal and NG tubes removed. Right PICC is stable. Heterogeneous opacities scattered throughout both lungs are worse. Low lung volumes. No pneumothorax. IMPRESSION: Extubated. Worsening bilateral airspace disease. Electronically Signed   By: Marybelle Killings M.D.   On: 03/04/2019 10:53   Dg Chest Port 1 View  Result Date: 03/03/2019 CLINICAL DATA:  Endotracheal tube present. EXAM: PORTABLE CHEST 1 VIEW COMPARISON:  March 02, 2019 FINDINGS: The right PICC line terminates in the central SVC. The ETT is in good position. The NG tube terminates below today's study. Cardiomegaly. The hila and mediastinum are unremarkable. Mild interstitial prominence. Probable mild atelectasis in the right base. IMPRESSION: 1. Support apparatus as above. 2.  Cardiomegaly and mild pulmonary edema. Mild atelectasis in the right base. Electronically Signed   By: Dorise Bullion III M.D   On: 03/03/2019 08:42   Dg Chest Port 1 View  Result Date: 03/02/2019 CLINICAL DATA:  Hypoxia EXAM: PORTABLE CHEST 1 VIEW COMPARISON:  February 28, 2019 FINDINGS: Endotracheal tube tip is 2.4 cm above the carina. Nasogastric tube tip is in the proximal stomach with the side port above the gastroesophageal junction. Central catheter tip is in the superior vena cava. No pneumothorax. There is cardiomegaly with pulmonary venous hypertension. There are pleural effusions  bilaterally. There is atelectatic change in the right base. There is a degree of underlying interstitial edema. Surgical clips are noted in the right axillary region. No bone lesions. No adenopathy evident. IMPRESSION: Tube and catheter positions as described without pneumothorax. Note that the side port of the nasogastric tube is above the diaphragm. Advise advancing nasogastric tube 8-10 cm. Pulmonary vascular congestion with interstitial edema and bilateral pleural effusions. Suspect a degree of underlying congestive heart failure. There may be a degree of ARDS as well. No consolidation. There is right base atelectasis. Electronically Signed   By: Lowella Grip III M.D.   On: 03/02/2019 09:50   Dg Chest Port 1 View  Result Date: 02/28/2019 CLINICAL DATA:  Possible COVID-19.  Endotracheal tube EXAM: PORTABLE CHEST 1 VIEW COMPARISON:  02/27/2019 FINDINGS: Endotracheal tube in good position. Right arm PICC tip in the SVC unchanged. NG in the stomach Cardiac enlargement. Progression of bilateral airspace disease diffusely, left greater than right. Small bilateral effusions have progressed. Progression of left lower lobe consolidation. IMPRESSION: Significant progression of diffuse bilateral airspace disease left greater than right which may represent pneumonia or edema. Progression of bilateral effusions. Support lines  remain in good position and unchanged. Electronically Signed   By: Franchot Gallo M.D.   On: 02/28/2019 08:10   Dg Chest Port 1 View  Result Date: 02/27/2019 CLINICAL DATA:  Intubated EXAM: PORTABLE CHEST 1 VIEW COMPARISON:  Chest radiograph from one day prior. FINDINGS: Endotracheal tube tip is 3.2 cm above the carina. Enteric tube enters stomach with the tip not seen on this image. Right PICC terminates in middle third of the SVC. Surgical clips overlie the right axilla. Stable cardiomediastinal silhouette with mild to moderate cardiomegaly. No pneumothorax. No pleural effusion. Diffuse hazy parahilar lung opacities appears slightly improved. IMPRESSION: 1. Well-positioned support structures. 2. Stable cardiomegaly. 3. Slight improvement in diffuse hazy parahilar lung opacities, suggesting improving pulmonary edema. Electronically Signed   By: Ilona Sorrel M.D.   On: 02/27/2019 07:58   Dg Chest Port 1 View  Result Date: 02/26/2019 CLINICAL DATA:  Acute respiratory failure.  Ventilator support. EXAM: PORTABLE CHEST 1 VIEW COMPARISON:  For a 2020 FINDINGS: Endotracheal tube tip 2.5 cm above the carina. Orogastric or nasogastric tube enters the abdomen. Right arm PICC tip in the SVC above the right atrium. Slight increased aeration in the lower lobes. Widespread patchy pulmonary infiltrates persist and show slight worsening. IMPRESSION: Better aeration in both lower lobes. However, there is also a pattern of worsening patchy airspace density within both lungs which seems to indicate worsening infiltrate. Electronically Signed   By: Nelson Chimes M.D.   On: 02/26/2019 06:19   Dg Chest Port 1 View  Result Date: 02/24/2019 CLINICAL DATA:  Acute respiratory failure EXAM: PORTABLE CHEST 1 VIEW COMPARISON:  02/23/2019 FINDINGS: Right PICC line, endotracheal tube and NG tube are unchanged. Cardiomegaly with vascular congestion. Bilateral lower lobe airspace opacities and layering effusions. IMPRESSION: Layering  bilateral effusions with bibasilar atelectasis or infiltrates. Cardiomegaly, vascular congestion. Electronically Signed   By: Rolm Baptise M.D.   On: 02/24/2019 08:21   Dg Chest Port 1 View  Result Date: 02/23/2019 CLINICAL DATA:  Check endotracheal tube EXAM: PORTABLE CHEST 1 VIEW COMPARISON:  02/22/2019 FINDINGS: Cardiac shadow is prominent but stable. The endotracheal tube lies at the level of the carina directed towards right mainstem bronchus and should be withdrawn 2-3 cm. Gastric catheter extends into the stomach. Increasing right basilar density with associated effusion is seen.  Slight improved aeration in the left base is noted. Postsurgical changes in the right axilla are seen. No bony abnormality is noted. Right-sided PICC line is noted in satisfactory position. IMPRESSION: Endotracheal tube directed towards the right mainstem bronchus. This should be withdrawn 2-3 cm. Increasing right basilar opacity with associated effusion. Some improved aeration in the left base is noted. Electronically Signed   By: Inez Catalina M.D.   On: 02/23/2019 08:11   Dg Chest Port 1 View  Result Date: 02/22/2019 CLINICAL DATA:  Respiratory failure EXAM: PORTABLE CHEST 1 VIEW COMPARISON:  02/21/2019 FINDINGS: Endotracheal tube placed. Tip is 1.5 cm from the carina. Left basilar consolidation is stable. Right lung is under aerated and clear. No pneumothorax or pleural effusion. IMPRESSION: Stable left basilar consolidation. Endotracheal tube placed. Electronically Signed   By: Marybelle Killings M.D.   On: 02/22/2019 07:56   Dg Chest Portable 1 View  Result Date: 02/21/2019 CLINICAL DATA:  Cough, tremors for 1 hour EXAM: PORTABLE CHEST 1 VIEW COMPARISON:  CT chest 01/05/2019, chest x-ray 12/12/2017 FINDINGS: There is hazy left lower lobe airspace disease concerning for pneumonia. There is no pleural effusion or pneumothorax. The heart and mediastinal contours are unremarkable. The osseous structures are unremarkable.  IMPRESSION: Hazy left lower lobe airspace disease concerning for pneumonia. Electronically Signed   By: Kathreen Devoid   On: 02/21/2019 14:30   Dg Abd Portable 1v  Result Date: 03/02/2019 CLINICAL DATA:  NG tube placement EXAM: PORTABLE ABDOMEN - 1 VIEW COMPARISON:  None. FINDINGS: Nasogastric tube tip and side port project over the stomach. Side port is just beyond the gastroesophageal junction. No dilated bowel visualized. IMPRESSION: Nasogastric tube tip and side port over the stomach. Electronically Signed   By: Ulyses Jarred M.D.   On: 03/02/2019 18:32   Dg Abd Portable 1v  Result Date: 02/22/2019 CLINICAL DATA:  Status post OG tube placement. EXAM: PORTABLE ABDOMEN - 1 VIEW COMPARISON:  None. FINDINGS: OG tube is in place and looped in the distal stomach, likely at the pylorus, with the tip projecting slightly retrograde. IMPRESSION: As above. Electronically Signed   By: Inge Rise M.D.   On: 02/22/2019 10:33   [place] Ekg Site Rite  Result Date: 02/22/2019 If Site Rite image not attached, placement could not be confirmed due to current cardiac rhythm.    Caroline More, DO 03/05/2019, 1:27 PM PGY-2, [facility] Intern pager: [phone], text pages welcome

## 2019-03-05 NOTE — TOC Benefit Eligibility Note (Signed)
Transition of Care [facility]) Benefit Eligibility Note    Patient Details  Name: Martha Mora MRN: [id number] Date of Birth: [birth year]   Medication/Dose: Arne [place]  5 MG BID(ELIQUIS  10 MG NON-FORMULARY)  Covered?: Yes  Tier: 3 Drug  Prescription Coverage Preferred Pharmacy: YES([facility]   )  Spoke with Person/Company/Phone Number:: [employer] #  [phone])  Co-Pay: $ 47.00  Prior Approval: No  Deductible: Met       Memory Argue Phone Number: 03/05/2019, 3:41 PM

## 2019-03-06 DIAGNOSIS — N390 Urinary tract infection, site not specified: Secondary | ICD-10-CM

## 2019-03-06 LAB — CBC WITH DIFFERENTIAL/PLATELET
Abs Immature Granulocytes: 0.24 10*3/uL — ABNORMAL HIGH (ref 0.00–0.07)
Basophils Absolute: 0.1 10*3/uL (ref 0.0–0.1)
Basophils Relative: 1 %
Eosinophils Absolute: 0.7 10*3/uL — ABNORMAL HIGH (ref 0.0–0.5)
Eosinophils Relative: 4 %
HCT: 31.2 % — ABNORMAL LOW (ref 36.0–46.0)
Hemoglobin: 8.8 g/dL — ABNORMAL LOW (ref 12.0–15.0)
Immature Granulocytes: 1 %
Lymphocytes Relative: 13 %
Lymphs Abs: 2.4 10*3/uL (ref 0.7–4.0)
MCH: 25.1 pg — ABNORMAL LOW (ref 26.0–34.0)
MCHC: 28.2 g/dL — ABNORMAL LOW (ref 30.0–36.0)
MCV: 89.1 fL (ref 80.0–100.0)
Monocytes Absolute: 1.2 10*3/uL — ABNORMAL HIGH (ref 0.1–1.0)
Monocytes Relative: 6 %
Neutro Abs: 14 10*3/uL — ABNORMAL HIGH (ref 1.7–7.7)
Neutrophils Relative %: 75 %
Platelets: 397 10*3/uL (ref 150–400)
RBC: 3.5 MIL/uL — ABNORMAL LOW (ref 3.87–5.11)
RDW: 17.1 % — ABNORMAL HIGH (ref 11.5–15.5)
WBC: 18.6 10*3/uL — ABNORMAL HIGH (ref 4.0–10.5)
nRBC: 0.3 % — ABNORMAL HIGH (ref 0.0–0.2)

## 2019-03-06 LAB — BASIC METABOLIC PANEL
Anion gap: 11 (ref 5–15)
BUN: 18 mg/dL (ref 8–23)
CO2: 33 mmol/L — ABNORMAL HIGH (ref 22–32)
Calcium: 8.9 mg/dL (ref 8.9–10.3)
Chloride: 99 mmol/L (ref 98–111)
Creatinine, Ser: 0.87 mg/dL (ref 0.44–1.00)
GFR calc Af Amer: >60 mL/min (ref >60)
GFR calc non Af Amer: >60 mL/min (ref >60)
Glucose, Bld: 151 mg/dL — ABNORMAL HIGH (ref 70–99)
Potassium: 3.3 mmol/L — ABNORMAL LOW (ref 3.5–5.1)
Sodium: 143 mmol/L (ref 135–145)

## 2019-03-06 LAB — GLUCOSE, CAPILLARY: Glucose-Capillary: 156 mg/dL — ABNORMAL HIGH (ref 70–99)

## 2019-03-06 LAB — HEPARIN LEVEL (UNFRACTIONATED): Heparin Unfractionated: 1.01 IU/mL — ABNORMAL HIGH (ref 0.30–0.70)

## 2019-03-06 LAB — APTT: aPTT: 70 seconds — ABNORMAL HIGH (ref 24–36)

## 2019-03-06 MED ORDER — LORAZEPAM 0.5 MG PO TABS: 0.5000 mg | ORAL_TABLET | Freq: Every evening | ORAL | Status: DC | PRN

## 2019-03-06 MED ORDER — TRAZODONE HCL 50 MG PO TABS
100.0000 mg | ORAL_TABLET | Freq: Every day | ORAL | Status: DC
  Administered 2019-03-06: 100 mg via ORAL
  Filled 2019-03-06: qty 2

## 2019-03-06 MED ORDER — AMLODIPINE BESYLATE 5 MG PO TABS: 5.0000 mg | ORAL_TABLET | Freq: Every day | ORAL | Status: DC

## 2019-03-06 MED ORDER — AMIODARONE HCL 200 MG PO TABS
200.0000 mg | ORAL_TABLET | Freq: Two times a day (BID) | ORAL | Status: DC
  Administered 2019-03-06 – 2019-03-10 (×8): 200 mg via ORAL
  Filled 2019-03-06 (×8): qty 1

## 2019-03-06 MED ORDER — POTASSIUM CHLORIDE 10 MEQ/100ML IV SOLN: 10.0000 meq | INTRAVENOUS | Status: DC

## 2019-03-06 MED ORDER — POTASSIUM CHLORIDE 10 MEQ/100ML IV SOLN
10.0000 meq | INTRAVENOUS | Status: AC
  Administered 2019-03-06 (×3): 10 meq via INTRAVENOUS
  Filled 2019-03-06 (×3): qty 100

## 2019-03-06 NOTE — Progress Notes (Signed)
ANTICOAGULATION CONSULT NOTE - Follow up [facility] for heparin Indication: chest pain/ACS  Allergies  Allergen Reactions  . Keflex [Cephalexin] Other (See Comments)    "Made me feel funny"  . Scallops [Shellfish Allergy] Nausea And Vomiting  . Penicillins Rash and Other (See Comments)    03/01/19 tolerated Zosyn Red bumps all over stomach Has patient had a PCN reaction causing immediate rash, facial/tongue/throat swelling, SOB or lightheadedness with hypotension: Yes Has patient had a PCN reaction causing severe rash involving mucus membranes or skin necrosis: No Has patient had a PCN reaction that required hospitalization: No Has patient had a PCN reaction occurring within the last 10 years: No If all of the above answers are "NO", then may proceed with Cephalosporin use.    Patient Measurements: Height: 4\' 11"  (149.9 cm) Weight: 216 lb 14.9 oz (98.4 kg) IBW/kg (Calculated) : 43.2 Heparin Dosing Weight: 71.5 kg  Vital Signs: Temp: 97.5 F (36.4 C) (04/18 0244) Temp Source: Oral (04/18 0244) BP: 130/64 (04/18 0244) Pulse Rate: 64 (04/18 0244)  Labs: Recent Labs    03/03/19 1303 03/03/19 2358 03/04/19 0424 03/04/19 1430 03/05/19 0832 03/06/19 0545  HGB  --   --  8.8*  --   --  8.8*  HCT  --   --  30.2*  --   --  31.2*  PLT  --   --  458*  --   --  397  APTT  --   --   --   --   --  70*  HEPARINUNFRC  --   --   --   --   --  1.01*  CREATININE 1.12*  --  1.06* 1.10* 1.12* 0.87  TROPONINI 1.83* 2.18* 1.82*  --   --   --     Estimated Creatinine Clearance: 57.6 mL/min (by C-G formula based on SCr of 0.87 mg/dL).  Assessment: 68 yof to restart heparin for new afib. She was transitioned to apixaban several days ago and received a dose this am. Possible urology stent placement so new orders received to change back to heparin. Will restart previous rate that she was at goal.  No cbc this am. Will monitor and adjust dose based on aptt until heparin levels  correlate.  Heparin level 1.01, aPTT 70 sec   Goal of Therapy:  aptt goal 66-102s Heparin level 0.3-0.7 units/ml Monitor platelets by anticoagulation protocol: Yes   Plan:  Continue heparin infusion at 1900 units/hr Daily HL/aptt and CBC while on heparin Monitor for bleeding complications  Thanks for allowing pharmacy to be a part of this patient's care.  Excell Seltzer PharmD Clinical Pharmacist 03/06/2019 6:54 AM

## 2019-03-06 NOTE — Progress Notes (Signed)
Spoke with pharmacy regarding transition off amio gtt to PO.  Agree that patient should only receive one dose PO today, but give early at 8pm or when gtt runs out.  Called nurse to verify that she should continue amio gtt until bag is finished and then will give amio PO when runs out.

## 2019-03-06 NOTE — Progress Notes (Signed)
RT offered pt CPAP dream station and pt states it makes her feel too chlosterphobic and she starts to panic. RT will continue to monitor.

## 2019-03-06 NOTE — Consult Note (Signed)
Ref: [name], DO   Subjective:  Awake and afebrile. Significant respiratory distress at rest. Chest x-ray is improving. Monitor shows sinus rhythm. She has mild hypokalemia,  low Hgb of 8.8 gm. and elevated but improving WBC count.  Objective:  Vital Signs in the last 24 hours: Temp:  [97.5 F (36.4 C)-98.4 F (36.9 C)] 97.9 F (36.6 C) (04/18 0811) Pulse Rate:  [61-80] 61 (04/18 0811) Cardiac Rhythm: Normal sinus rhythm (04/18 0811) Resp:  [16-27] 25 (04/18 0811) BP: (130-159)/(54-64) 159/60 (04/18 0811) SpO2:  [90 %-98 %] 98 % (04/18 0811) Weight:  [98.4 kg] 98.4 kg (04/18 0332)  Physical Exam: BP Readings from Last 1 Encounters:  03/06/19 (!) 159/60     Wt Readings from Last 1 Encounters:  03/06/19 98.4 kg    Weight change:  Body mass index is 43.82 kg/m. HEENT: [place]/AT, Eyes-Brown, PERL, EOMI, Conjunctiva-Pale, Sclera-Non-icteric Neck: No JVD, No bruit, Trachea midline. Lungs:  Clearing, Bilateral. Cardiac:  Regular rhythm, normal S1 and S2, no S3. II/VI systolic murmur. Abdomen:  Soft, non-tender. BS present. Extremities:  No edema present. No cyanosis. No clubbing. CNS: AxOx3, Cranial nerves grossly intact, moves all 4 extremities.  Skin: Warm and dry.   Intake/Output from previous day: 04/17 0701 - 04/18 0700 In: 4253.4 [P.O.:240; I.V.:3913.4; IV Piggyback:100] Out: 850 [Urine:850]    Lab Results: BMET    Component Value Date/Time   NA 143 03/06/2019 0545   NA 149 (H) 03/05/2019 0832   NA 156 (H) 03/04/2019 1430   NA 141 08/08/2017 1009   NA 140 02/05/2017 0941   NA 142 08/05/2016 1022   K 3.3 (L) 03/06/2019 0545   K 3.9 03/05/2019 0832   K 2.9 (L) 03/04/2019 1430   K 4.0 08/08/2017 1009   K 4.1 02/05/2017 0941   K 4.1 08/05/2016 1022   CL 99 03/06/2019 0545   CL 103 03/05/2019 0832   CL 108 03/04/2019 1430   CO2 33 (H) 03/06/2019 0545   CO2 33 (H) 03/05/2019 0832   CO2 35 (H) 03/04/2019 1430   CO2 25 08/08/2017 1009   CO2 23 02/05/2017  0941   CO2 25 08/05/2016 1022   GLUCOSE 151 (H) 03/06/2019 0545   GLUCOSE 168 (H) 03/05/2019 0832   GLUCOSE 206 (H) 03/04/2019 1430   GLUCOSE 95 08/08/2017 1009   GLUCOSE 120 02/05/2017 0941   GLUCOSE 107 08/05/2016 1022   BUN 18 03/06/2019 0545   BUN 23 03/05/2019 0832   BUN 29 (H) 03/04/2019 1430   BUN 21.7 08/08/2017 1009   BUN 14.4 02/05/2017 0941   BUN 12.0 08/05/2016 1022   CREATININE 0.87 03/06/2019 0545   CREATININE 1.12 (H) 03/05/2019 0832   CREATININE 1.10 (H) 03/04/2019 1430   CREATININE 0.8 08/08/2017 1009   CREATININE 0.8 02/05/2017 0941   CREATININE 0.8 08/05/2016 1022   CALCIUM 8.9 03/06/2019 0545   CALCIUM 9.0 03/05/2019 0832   CALCIUM 9.1 03/04/2019 1430   CALCIUM 9.0 08/08/2017 1009   CALCIUM 9.4 02/05/2017 0941   CALCIUM 8.8 08/05/2016 1022   GFRNONAA >60 03/06/2019 0545   GFRNONAA 48 (L) 03/05/2019 0832   GFRNONAA 49 (L) 03/04/2019 1430   GFRNONAA 82 10/23/2015 1158   GFRAA >60 03/06/2019 0545   GFRAA 56 (L) 03/05/2019 0832   GFRAA 57 (L) 03/04/2019 1430   GFRAA >89 10/23/2015 1158   CBC    Component Value Date/Time   WBC 18.6 (H) 03/06/2019 0545   RBC 3.50 (L) 03/06/2019 0545  HGB 8.8 (L) 03/06/2019 0545   HGB 12.7 12/10/2018 0844   HGB 12.8 08/08/2017 1009   HCT 31.2 (L) 03/06/2019 0545   HCT 39.8 12/10/2018 0844   HCT 39.8 08/08/2017 1009   PLT 397 03/06/2019 0545   PLT 297 12/10/2018 0844   MCV 89.1 03/06/2019 0545   MCV 83 12/10/2018 0844   MCV 93.9 08/08/2017 1009   MCH 25.1 (L) 03/06/2019 0545   MCHC 28.2 (L) 03/06/2019 0545   RDW 17.1 (H) 03/06/2019 0545   RDW 14.9 12/10/2018 0844   RDW 14.8 (H) 08/08/2017 1009   LYMPHSABS 2.4 03/06/2019 0545   LYMPHSABS 3.2 08/08/2017 1009   MONOABS 1.2 (H) 03/06/2019 0545   MONOABS 1.2 (H) 08/08/2017 1009   EOSABS 0.7 (H) 03/06/2019 0545   EOSABS 0.1 08/08/2017 1009   BASOSABS 0.1 03/06/2019 0545   BASOSABS 0.1 08/08/2017 1009   HEPATIC Function Panel Recent Labs    02/27/19 0236  02/28/19 0500 03/01/19 0500  PROT 5.9* 5.6* 5.4*   HEMOGLOBIN A1C No components found for: HGA1C,  MPG CARDIAC ENZYMES Lab Results  Component Value Date   TROPONINI 1.82 (HH) 03/04/2019   TROPONINI 2.18 (HH) 03/03/2019   TROPONINI 1.83 (HH) 03/03/2019   BNP No results for input(s): PROBNP in the last 8760 hours. TSH Recent Labs    10/06/18 1051  TSH 1.450   CHOLESTEROL Recent Labs    10/06/18 1051  CHOL 153    Scheduled Meds: . Chlorhexidine Gluconate Cloth  6 each Topical Daily  . citalopram  40 mg Oral Daily  . feeding supplement (ENSURE ENLIVE)  237 mL Oral Q24H  . furosemide  40 mg Intravenous BID  . losartan  50 mg Oral Daily  . mouth rinse  15 mL Mouth Rinse BID  . multivitamin with minerals  1 tablet Oral Daily  . nicotine  21 mg Transdermal Daily  . pantoprazole  40 mg Oral Daily  . ramelteon  8 mg Oral QHS  . rOPINIRole  3 mg Oral QHS  . rosuvastatin  20 mg Oral Daily  . senna-docusate  1 tablet Oral BID  . sodium chloride flush  10-40 mL Intracatheter Q12H  . traZODone  50 mg Oral QHS   Continuous Infusions: . amiodarone 30 mg/hr (03/06/19 0948)  . cefTRIAXone (ROCEPHIN)  IV 2 g (03/05/19 1341)  . dextrose 5 % with kcl 125 mL/hr at 03/06/19 0800  . heparin 1,900 Units/hr (03/06/19 0800)  . potassium chloride 10 mEq (03/06/19 0951)   PRN Meds:.acetaminophen, albuterol, hydrALAZINE, ondansetron (ZOFRAN) IV, polyvinyl alcohol, sodium chloride flush  Assessment/Plan: Paroxysmal atrial fibrillation Acute on chronic respiratory failure with hypoxemia Acute left heart diastolic failure E. Coli sepsis/Urosepsis Urolithiasis CAD S/P stent Obesity OSA Troponin I elevation from demand ischemia Anemia of chronic diseasse Hypokalemia Type 2 DM  Hold Eliquis for Urologic procedure. Increase patient activity.   LOS: 12 days    [name]  MD  03/06/2019, 10:06 AM

## 2019-03-06 NOTE — Progress Notes (Addendum)
Family Medicine Teaching Service Daily Progress Note Intern Pager: [phone]  Patient name: Martha Mora Medical record number: [id number] Date of birth: 02-23-1946 Age: 73 y.o. Gender: female  Primary Care Provider: [name], DO Consultants: CCM Code Status: Full   Pt Overview and Major Events to Date:  Admitted to [facility] on 4/5 Transfer of care to CCM of 4/6 and intubated Extubated on 4/15 New onset afib 4/15   Assessment and Plan: Martha Mora is a 73 y.o. female admitted for fever and SOB and diagnosed with urosepsis. Also found to have LLL haziness on CXR.  Her chronic conditions include COPD, anxiety, MVR, HTN, GERD, incontinence, HLD, obesity, restless leg syndrome.   E coli bacteremia 2/2 UTI Urine cx from 4/5 growing >=100,000 colonies E.coli with blood cultures from 4/5 also showing E.coli. Blood cultures from 4/12 showing no growth x4 days. On admission with septic shock, now resolved. S/p rocephin 4/6-4/11, vanc 4/12-4/16, zosyn 4/12-4/16. Now currently on rocephin (4/16 - ). CT on 3/5 showing 7mm non obstructing left stone with no hydronephrosis. Patient has been afebrile since 4/14. Total UOP on 4/16 of 2.470L. CT abd/pelvis on 4/16 showing 9x14x44mm left UPJ stone causing mild/mod left hydronephrosis and evidence of obstructive uropathy. Likely infection proximal to the stone.  Urology consulted yesterday for possible removal, and they stated they would reach out to IR to assess better method for kidney drainage, plan for procedure Monday. WBC improving: 23.8>18.6. -continue rocephin (4/16 - ), can narrow abx given that most recent BCx have been negative  -Urology following, appreciate recs - will f/u IR recs should they formally consult -repeat urine cx pending, will follow  - holding eliquis, on hep gtt pending procedure  Acute hypoxemic respiratory failure  Resolved. In setting of septic shock. Patient intubated on 4/6 with successful extubation on 4/15. COVID  negative. No h/o home O2 use. On 4L at present. -continue PRN albuterol  -continue PRN O2, goal of O2 sat 88-95%  Afib  New onset. Patient also with elevated troponins during ICU admission, 2/2 demand ischemia. CHADSVASC 6. HR 60 this AM. H/o CAD s/p RCA stent.  -continuous cardiac monitoring  - ASA at home for hx DES, holding ASA for poss procedure, will need to determine if needed on d/c as she will be d/c'ed on Eliquis - holding eliquis for possible procedure - on hep gtt -spoke with cardiology over the phone, amiodarone can be transitioned to PO, will start 200mg  BID x 1 week and then 200mg  QD  Acute diastolic CHF No prior history CHF.  EF 4/10 showed EF 55%. UOP -2.5 L yesterday. -Furosemide 40 mg IV twice a day, consider decreasing 4/19 pending Cr -Strict I/Os, daily weights, telemetry  -Daily monitoring renal function -cardiology consulted, appreciate recommendations   AKI: Resolved Basline ~ 0.8, this AM Cr 0.87.  -monitor BMP  Hypernatremia: Resolved Na 143 this AM.  -d/c fluids -lasix 40 bid  Hypokalemia  K 3.3 this AM.  Mag has been WNL, most recently 4/16 was 2.4.  Likely 2/2 diuresis  -KCl in fluids 20mEq x3, with 40 KCl in fluids -follow on daily BMP  COPD Albuterol PRN at home. Smoking history with recent vaping use.  -continue albuterol PRN -encourage smoking cessation   HTN BP this AM 159/60. 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 Followed by Dr. [name] (outpatiet cardiology). Placed on aspirin 325 by Dr. [name] during visit in September 2019.  Was on DAPT x 1 year. ASCVD risk or is 21.3%. -holding ASA in setting of eliquis, holding eliquis in setting of poss procedure, on hep  gtt -continue statin  -cardiology consulted, appreciate recommendations   Anemia Hgb 8.8>8.8.  Likely 2/2 acute illness. -follow on daily CBC  Restless leg syndrome  Home meds: Requip 3mg  qhs -continue requip   Anxiety Home meds: celexa 40mg  daily, trazodone 50mg  qhs PRN -continue home meds   FEN/GI: regular diet  PPx: hep gtt  Disposition: continued inpatient, home with [facility] pending d/c  Subjective:  Patient states that she is feeling well today.  States that she is tolerating diet, but endorses decreased appetite.  No other complaints.   States that her breathing has improved.  Objective: Temp:  [97.5 F (36.4 C)-98.4 F (36.9 C)] 97.9 F (36.6 C) (04/18 0811) Pulse Rate:  [61-80] 61 (04/18 0811) Resp:  [16-27] 25 (04/18 0811) BP: (130-159)/(54-64) 159/60 (04/18 0811) SpO2:  [90 %-98 %] 98 % (04/18 0811) Weight:  [98.4 kg] 98.4 kg (04/18 0332)  Physical Exam: General: 73 y.o. female in NAD, sitting on edge of bed, eating breakfast Cardio: RRR no m/r/g Lungs: CTAB, no wheezing, no rhonchi, no crackles Abdomen: Soft, non-tender to palpation, positive bowel sounds Skin: warm and dry Extremities: No edema  Laboratory: Recent Labs  Lab 03/03/19 0434 03/04/19 0424 03/06/19 0545  WBC 24.9* 23.8* 18.6*  HGB 8.7* 8.8* 8.8*  HCT 29.5* 30.2* 31.2*  PLT 389 458* 397   Recent Labs  Lab 02/28/19 0500 03/01/19 0500  03/04/19 1430 03/05/19 0832 03/06/19 0545  NA 148* 154*   < > 156* 149* 143  K 3.4* 2.8*   < > 2.9* 3.9 3.3*  CL 100 102   < > 108 103 99  CO2 33* 40*   < > 35* 33* 33*  BUN 37* 41*   < > 29* 23 18  CREATININE 1.06* 1.22*   < > 1.10* 1.12* 0.87  CALCIUM 8.9 8.5*   < > 9.1 9.0 8.9  PROT 5.6* 5.4*  --   --   --   --   BILITOT 0.4 0.7  --   --   --   --   ALKPHOS 129* 151*  --   --   --   --   ALT 33 50*  --   --   --   --   AST 51* 100*  --   --   --   --   GLUCOSE 164* 133*   < > 206* 168* 151*   < > = values in this interval not displayed.      Imaging/Diagnostic Tests: Ct Abdomen Pelvis W Wo Contrast  Result Date: 03/04/2019 CLINICAL DATA:  Abdominal pain and fever.  Abscess suspected. EXAM: CT ABDOMEN AND PELVIS WITHOUT AND WITH CONTRAST TECHNIQUE: Multidetector CT imaging of the abdomen and pelvis was performed following the standard protocol before and following the bolus administration of intravenous contrast. CONTRAST:  71mL OMNIPAQUE IOHEXOL 300 MG/ML  SOLN COMPARISON:  01/21/2019 FINDINGS: Lower chest: Patchy airspace disease both lower lungs suggest multifocal pneumonia. Small bilateral pleural effusions evident. Enlargement of the pulmonary arteries suggests pulmonary arterial hypertension. Hepatobiliary: Insert normal infused liver gallbladder surgically absent. No intrahepatic or extrahepatic biliary  dilation. Pancreas: No focal mass lesion. No dilatation of the main duct. No intraparenchymal cyst. No peripancreatic edema. Spleen: No splenomegaly. No focal mass lesion. Adrenals/Urinary Tract: No adrenal nodule or mass. Precontrast imaging shows a 2 mm nonobstructing stone in the lower pole the right kidney. Right ureter unremarkable. 9 x 14 x 9 mm stone is identified in the left renal pelvis (best seen coronal image 83/series 11) with mild to moderate left hydronephrosis. Decreased perfusion to the left kidney is compatible with obstructive uropathy. Probable 12 mm cyst lower pole left kidney, similar to prior. Left ureter unremarkable. The urinary bladder appears normal for the degree of distention. Stomach/Bowel: Stomach is decompressed. Duodenum is normally positioned as is the ligament of Treitz. No small bowel wall thickening. No small bowel dilatation. Colon is nondilated. Rectal tube evident. Vascular/Lymphatic: There is abdominal aortic atherosclerosis without aneurysm. There is no gastrohepatic or hepatoduodenal ligament lymphadenopathy. No intraperitoneal or retroperitoneal lymphadenopathy. No pelvic sidewall  lymphadenopathy. Reproductive: The uterus is surgically absent. There is no adnexal mass. Other: No intraperitoneal free fluid. Musculoskeletal: Small umbilical hernia contains only fat. Subcutaneous edema noted lateral right abdominal wall. Status post ORIF left proximal femur. Stable small sclerotic focus right femoral head, likely benign. No worrisome lytic or sclerotic osseous abnormality. IMPRESSION: 1. 9 x 14 x 9 mm left UPJ stone causes mild to moderate left hydronephrosis and evidence of obstructive uropathy. Given the history of fever, infection proximal to the stone is a consideration. 2. Subcutaneous edema identified lateral right abdominal wall. Given the asymmetry, cellulitis cannot be excluded. 3. Patchy airspace disease in both lower lobes with small bilateral pleural effusions. Multifocal pneumonia consideration. 4. Enlargement of the main pulmonary arteries suggests pulmonary arterial hypertension. 5.  Aortic Atherosclerois (ICD10-170.0) Electronically Signed   By: Misty Stanley M.D.   On: 03/04/2019 21:20   US Renal  Result Date: 02/27/2019 CLINICAL DATA:  Initial evaluation for acute renal failure, UTI. EXAM: RENAL / URINARY TRACT ULTRASOUND COMPLETE COMPARISON:  Prior CT from 01/21/2019 FINDINGS: Right Kidney: Renal measurements: 10.4 x 5.5 x 6.5 cm. Echogenicity within normal limits. No mass or hydronephrosis visualized. Left Kidney: Not visualized. Bladder: Bladder decompressed with a Foley catheter in place. IMPRESSION: 1. Technically limited exam due to body habitus and patient's inability to cooperate with the study. 2. Normal right kidney without hydronephrosis. 3. Nonvisualization of the left kidney. Electronically Signed   By: Jeannine Boga M.D.   On: 02/27/2019 07:37   Dg Chest Port 1 View  Result Date: 03/05/2019 CLINICAL DATA:  PICC line placement. EXAM: PORTABLE CHEST 1 VIEW COMPARISON:  03/04/2019 FINDINGS: 2156 hours. Right PICC line tip is overlying the mid SVC  level. Marked interval improvement and bilateral airspace disease seen previously with only minimal residual in the right base currently. Probable tiny bilateral pleural effusions. Cardiopericardial silhouette is at upper limits of normal for size. The visualized bony structures of the thorax are intact. Telemetry leads overlie the chest. IMPRESSION: 1. Marked interval decrease in bilateral diffuse airspace opacity. 2. Right PICC line tip overlies the mid SVC level. Electronically Signed   By: Misty Stanley M.D.   On: 03/05/2019 21:33   Dg Chest Port 1 View  Result Date: 03/04/2019 CLINICAL DATA:  Hypoxia EXAM: PORTABLE CHEST 1 VIEW COMPARISON:  03/03/2019 FINDINGS: Endotracheal and NG tubes removed. Right PICC is stable. Heterogeneous opacities scattered throughout both lungs are worse. Low lung volumes. No pneumothorax. IMPRESSION: Extubated. Worsening bilateral airspace disease. Electronically Signed   By: Arnell Sieving  Hoss M.D.   On: 03/04/2019 10:53   Dg Chest Port 1 View  Result Date: 03/03/2019 CLINICAL DATA:  Endotracheal tube present. EXAM: PORTABLE CHEST 1 VIEW COMPARISON:  March 02, 2019 FINDINGS: The right PICC line terminates in the central SVC. The ETT is in good position. The NG tube terminates below today's study. Cardiomegaly. The hila and mediastinum are unremarkable. Mild interstitial prominence. Probable mild atelectasis in the right base. IMPRESSION: 1. Support apparatus as above. 2. Cardiomegaly and mild pulmonary edema. Mild atelectasis in the right base. Electronically Signed   By: Dorise Bullion III M.D   On: 03/03/2019 08:42   Dg Chest Port 1 View  Result Date: 03/02/2019 CLINICAL DATA:  Hypoxia EXAM: PORTABLE CHEST 1 VIEW COMPARISON:  February 28, 2019 FINDINGS: Endotracheal tube tip is 2.4 cm above the carina. Nasogastric tube tip is in the proximal stomach with the side port above the gastroesophageal junction. Central catheter tip is in the superior vena cava. No pneumothorax.  There is cardiomegaly with pulmonary venous hypertension. There are pleural effusions bilaterally. There is atelectatic change in the right base. There is a degree of underlying interstitial edema. Surgical clips are noted in the right axillary region. No bone lesions. No adenopathy evident. IMPRESSION: Tube and catheter positions as described without pneumothorax. Note that the side port of the nasogastric tube is above the diaphragm. Advise advancing nasogastric tube 8-10 cm. Pulmonary vascular congestion with interstitial edema and bilateral pleural effusions. Suspect a degree of underlying congestive heart failure. There may be a degree of ARDS as well. No consolidation. There is right base atelectasis. Electronically Signed   By: Lowella Grip III M.D.   On: 03/02/2019 09:50   Dg Chest Port 1 View  Result Date: 02/28/2019 CLINICAL DATA:  Possible COVID-19.  Endotracheal tube EXAM: PORTABLE CHEST 1 VIEW COMPARISON:  02/27/2019 FINDINGS: Endotracheal tube in good position. Right arm PICC tip in the SVC unchanged. NG in the stomach Cardiac enlargement. Progression of bilateral airspace disease diffusely, left greater than right. Small bilateral effusions have progressed. Progression of left lower lobe consolidation. IMPRESSION: Significant progression of diffuse bilateral airspace disease left greater than right which may represent pneumonia or edema. Progression of bilateral effusions. Support lines remain in good position and unchanged. Electronically Signed   By: Franchot Gallo M.D.   On: 02/28/2019 08:10   Dg Chest Port 1 View  Result Date: 02/27/2019 CLINICAL DATA:  Intubated EXAM: PORTABLE CHEST 1 VIEW COMPARISON:  Chest radiograph from one day prior. FINDINGS: Endotracheal tube tip is 3.2 cm above the carina. Enteric tube enters stomach with the tip not seen on this image. Right PICC terminates in middle third of the SVC. Surgical clips overlie the right axilla. Stable cardiomediastinal  silhouette with mild to moderate cardiomegaly. No pneumothorax. No pleural effusion. Diffuse hazy parahilar lung opacities appears slightly improved. IMPRESSION: 1. Well-positioned support structures. 2. Stable cardiomegaly. 3. Slight improvement in diffuse hazy parahilar lung opacities, suggesting improving pulmonary edema. Electronically Signed   By: Ilona Sorrel M.D.   On: 02/27/2019 07:58   Dg Chest Port 1 View  Result Date: 02/26/2019 CLINICAL DATA:  Acute respiratory failure.  Ventilator support. EXAM: PORTABLE CHEST 1 VIEW COMPARISON:  For a 2020 FINDINGS: Endotracheal tube tip 2.5 cm above the carina. Orogastric or nasogastric tube enters the abdomen. Right arm PICC tip in the SVC above the right atrium. Slight increased aeration in the lower lobes. Widespread patchy pulmonary infiltrates persist and show slight worsening. IMPRESSION: Better aeration  in both lower lobes. However, there is also a pattern of worsening patchy airspace density within both lungs which seems to indicate worsening infiltrate. Electronically Signed   By: Nelson Chimes M.D.   On: 02/26/2019 06:19   Dg Chest Port 1 View  Result Date: 02/24/2019 CLINICAL DATA:  Acute respiratory failure EXAM: PORTABLE CHEST 1 VIEW COMPARISON:  02/23/2019 FINDINGS: Right PICC line, endotracheal tube and NG tube are unchanged. Cardiomegaly with vascular congestion. Bilateral lower lobe airspace opacities and layering effusions. IMPRESSION: Layering bilateral effusions with bibasilar atelectasis or infiltrates. Cardiomegaly, vascular congestion. Electronically Signed   By: Rolm Baptise M.D.   On: 02/24/2019 08:21   Dg Chest Port 1 View  Result Date: 02/23/2019 CLINICAL DATA:  Check endotracheal tube EXAM: PORTABLE CHEST 1 VIEW COMPARISON:  02/22/2019 FINDINGS: Cardiac shadow is prominent but stable. The endotracheal tube lies at the level of the carina directed towards right mainstem bronchus and should be withdrawn 2-3 cm. Gastric catheter  extends into the stomach. Increasing right basilar density with associated effusion is seen. Slight improved aeration in the left base is noted. Postsurgical changes in the right axilla are seen. No bony abnormality is noted. Right-sided PICC line is noted in satisfactory position. IMPRESSION: Endotracheal tube directed towards the right mainstem bronchus. This should be withdrawn 2-3 cm. Increasing right basilar opacity with associated effusion. Some improved aeration in the left base is noted. Electronically Signed   By: Inez Catalina M.D.   On: 02/23/2019 08:11   Dg Chest Port 1 View  Result Date: 02/22/2019 CLINICAL DATA:  Respiratory failure EXAM: PORTABLE CHEST 1 VIEW COMPARISON:  02/21/2019 FINDINGS: Endotracheal tube placed. Tip is 1.5 cm from the carina. Left basilar consolidation is stable. Right lung is under aerated and clear. No pneumothorax or pleural effusion. IMPRESSION: Stable left basilar consolidation. Endotracheal tube placed. Electronically Signed   By: Marybelle Killings M.D.   On: 02/22/2019 07:56   Dg Chest Portable 1 View  Result Date: 02/21/2019 CLINICAL DATA:  Cough, tremors for 1 hour EXAM: PORTABLE CHEST 1 VIEW COMPARISON:  CT chest 01/05/2019, chest x-ray 12/12/2017 FINDINGS: There is hazy left lower lobe airspace disease concerning for pneumonia. There is no pleural effusion or pneumothorax. The heart and mediastinal contours are unremarkable. The osseous structures are unremarkable. IMPRESSION: Hazy left lower lobe airspace disease concerning for pneumonia. Electronically Signed   By: Kathreen Devoid   On: 02/21/2019 14:30   Dg Abd Portable 1v  Result Date: 03/02/2019 CLINICAL DATA:  NG tube placement EXAM: PORTABLE ABDOMEN - 1 VIEW COMPARISON:  None. FINDINGS: Nasogastric tube tip and side port project over the stomach. Side port is just beyond the gastroesophageal junction. No dilated bowel visualized. IMPRESSION: Nasogastric tube tip and side port over the stomach. Electronically  Signed   By: Ulyses Jarred M.D.   On: 03/02/2019 18:32   Dg Abd Portable 1v  Result Date: 02/22/2019 CLINICAL DATA:  Status post OG tube placement. EXAM: PORTABLE ABDOMEN - 1 VIEW COMPARISON:  None. FINDINGS: OG tube is in place and looped in the distal stomach, likely at the pylorus, with the tip projecting slightly retrograde. IMPRESSION: As above. Electronically Signed   By: Inge Rise M.D.   On: 02/22/2019 10:33   [place] Ekg Site Rite  Result Date: 03/05/2019 If Site Rite image not attached, placement could not be confirmed due to current cardiac rhythm.  [place] Ekg Site Rite  Result Date: 02/22/2019 If [facility] image not attached, placement could not be  confirmed due to current cardiac rhythm.    [place], DO 03/06/2019, 8:33 AM PGY-2, [place] Intern pager: [phone], text pages welcome

## 2019-03-06 NOTE — Progress Notes (Addendum)
Subjective: Patient reports she is feeling much better.  Objective: Vital signs in last 24 hours: Temp:  [97.5 F (36.4 C)-98.7 F (37.1 C)] 98.7 F (37.1 C) (04/18 1126) Pulse Rate:  [61-73] 61 (04/18 1126) Resp:  [16-27] 24 (04/18 1126) BP: (126-159)/(54-64) 126/60 (04/18 1126) SpO2:  [90 %-98 %] 95 % (04/18 1126) Weight:  [98.4 kg] 98.4 kg (04/18 0332)  Intake/Output from previous day: 04/17 0701 - 04/18 0700 In: 4253.4 [P.O.:240; I.V.:3913.4; IV Piggyback:100] Out: 850 [Urine:850] Intake/Output this shift: Total I/O In: 328.4 [I.V.:128.4; IV Piggyback:200] Out: -   Physical Exam:  Sitting in a chair watching TV Nasal cannula upon her head, 90% on room air, breathing with normal effort and depth  Lab Results: Recent Labs    03/04/19 0424 03/06/19 0545  HGB 8.8* 8.8*  HCT 30.2* 31.2*   BMET Recent Labs    03/05/19 0832 03/06/19 0545  NA 149* 143  K 3.9 3.3*  CL 103 99  CO2 33* 33*  GLUCOSE 168* 151*  BUN 23 18  CREATININE 1.12* 0.87  CALCIUM 9.0 8.9   No results for input(s): LABPT, INR in the last 72 hours. No results for input(s): LABURIN in the last 72 hours. Results for orders placed or performed during the hospital encounter of 02/21/19  Blood culture (routine x 2)     Status: Abnormal   Collection Time: 02/21/19  2:52 PM  Result Value Ref Range Status   Specimen Description BLOOD RIGHT HAND  Final   Special Requests   Final    BOTTLES DRAWN AEROBIC AND ANAEROBIC Blood Culture results may not be optimal due to an inadequate volume of blood received in culture bottles   Culture  Setup Time   Final    GRAM NEGATIVE RODS IN BOTH AEROBIC AND ANAEROBIC BOTTLES CRITICAL VALUE NOTED.  VALUE IS CONSISTENT WITH PREVIOUSLY REPORTED AND CALLED VALUE.    Culture (A)  Final    ESCHERICHIA COLI SUSCEPTIBILITIES PERFORMED ON PREVIOUS CULTURE WITHIN THE LAST 5 DAYS. Performed at [facility], [place] [address], [place], [place] [address]    Report  Status 02/24/2019 FINAL  Final  Urine Culture     Status: Abnormal   Collection Time: 02/21/19  2:57 PM  Result Value Ref Range Status   Specimen Description URINE, RANDOM  Final   Special Requests   Final    NONE Performed at [facility], [place] [address] [place], [place] [address]    Culture >=100,000 COLONIES/mL ESCHERICHIA COLI (A)  Final   Report Status 02/23/2019 FINAL  Final   Organism ID, Bacteria ESCHERICHIA COLI (A)  Final      Susceptibility   Escherichia coli - MIC*    AMPICILLIN >=32 RESISTANT Resistant     CEFAZOLIN <=4 SENSITIVE Sensitive     CEFTRIAXONE <=1 SENSITIVE Sensitive     CIPROFLOXACIN <=0.25 SENSITIVE Sensitive     GENTAMICIN <=1 SENSITIVE Sensitive     IMIPENEM <=0.25 SENSITIVE Sensitive     NITROFURANTOIN <=16 SENSITIVE Sensitive     TRIMETH/SULFA <=20 SENSITIVE Sensitive     AMPICILLIN/SULBACTAM >=32 RESISTANT Resistant     PIP/TAZO <=4 SENSITIVE Sensitive     Extended ESBL NEGATIVE Sensitive     * >=100,000 COLONIES/mL ESCHERICHIA COLI  Blood culture (routine x 2)     Status: Abnormal   Collection Time: 02/21/19  2:59 PM  Result Value Ref Range Status   Specimen Description BLOOD RIGHT HAND  Final   Special Requests  Final    BOTTLES DRAWN AEROBIC AND ANAEROBIC Blood Culture adequate volume Performed at [facility], [place] [address] [place], [place] [address]    Culture  Setup Time   Final    GRAM NEGATIVE RODS IN BOTH AEROBIC AND ANAEROBIC BOTTLES CRITICAL RESULT CALLED TO, READ BACK BY AND VERIFIED WITH: PHRMD L SEAY @0326  02/22/19 BY S GEZAHEGN    Culture ESCHERICHIA COLI (A)  Final   Report Status 02/24/2019 FINAL  Final   Organism ID, Bacteria ESCHERICHIA COLI  Final      Susceptibility   Escherichia coli - MIC*    AMPICILLIN >=32 RESISTANT Resistant     CEFAZOLIN <=4 SENSITIVE Sensitive     CEFEPIME <=1 SENSITIVE Sensitive     CEFTAZIDIME <=1 SENSITIVE Sensitive     CEFTRIAXONE <=1 SENSITIVE Sensitive     CIPROFLOXACIN  <=0.25 SENSITIVE Sensitive     GENTAMICIN <=1 SENSITIVE Sensitive     IMIPENEM <=0.25 SENSITIVE Sensitive     TRIMETH/SULFA <=20 SENSITIVE Sensitive     AMPICILLIN/SULBACTAM >=32 RESISTANT Resistant     PIP/TAZO <=4 SENSITIVE Sensitive     Extended ESBL NEGATIVE Sensitive     * ESCHERICHIA COLI  Blood Culture ID Panel (Reflexed)     Status: Abnormal   Collection Time: 02/21/19  2:59 PM  Result Value Ref Range Status   Enterococcus species NOT DETECTED NOT DETECTED Final   Listeria monocytogenes NOT DETECTED NOT DETECTED Final   Staphylococcus species NOT DETECTED NOT DETECTED Final   Staphylococcus aureus (BCID) NOT DETECTED NOT DETECTED Final   Streptococcus species NOT DETECTED NOT DETECTED Final   Streptococcus agalactiae NOT DETECTED NOT DETECTED Final   Streptococcus pneumoniae NOT DETECTED NOT DETECTED Final   Streptococcus pyogenes NOT DETECTED NOT DETECTED Final   Acinetobacter baumannii NOT DETECTED NOT DETECTED Final   Enterobacteriaceae species DETECTED (A) NOT DETECTED Final    Comment: Enterobacteriaceae represent a large family of gram-negative bacteria, not a single organism. CRITICAL RESULT CALLED TO, READ BACK BY AND VERIFIED WITH: PHRMD L SEAY @0326  02/22/19 BY S GEZAHEGN    Enterobacter cloacae complex NOT DETECTED NOT DETECTED Final   Escherichia coli DETECTED (A) NOT DETECTED Final    Comment: CRITICAL RESULT CALLED TO, READ BACK BY AND VERIFIED WITH: PHRMD L SEAY @0326  02/22/19 BY S GEZAHEGN    Klebsiella oxytoca NOT DETECTED NOT DETECTED Final   Klebsiella pneumoniae NOT DETECTED NOT DETECTED Final   Proteus species NOT DETECTED NOT DETECTED Final   Serratia marcescens NOT DETECTED NOT DETECTED Final   Carbapenem resistance NOT DETECTED NOT DETECTED Final   Haemophilus influenzae NOT DETECTED NOT DETECTED Final   Neisseria meningitidis NOT DETECTED NOT DETECTED Final   Pseudomonas aeruginosa NOT DETECTED NOT DETECTED Final   Candida albicans NOT DETECTED NOT  DETECTED Final   Candida glabrata NOT DETECTED NOT DETECTED Final   Candida krusei NOT DETECTED NOT DETECTED Final   Candida parapsilosis NOT DETECTED NOT DETECTED Final   Candida tropicalis NOT DETECTED NOT DETECTED Final    Comment: Performed at [facility], [place] [address], [place], [place] [address]  Novel Coronavirus, NAA (hospital order; send-out to ref lab)     Status: None   Collection Time: 02/21/19  4:51 PM  Result Value Ref Range Status   SARS-CoV-2, NAA NOT DETECTED NOT DETECTED Final    Comment: Negative (Not Detected) results do not exclude infection caused by SARS CoV 2 and should not be used as the sole  basis for treatment or other patient management decisions. Optimum specimen types and timing for peak viral levels during infections caused  by SARS CoV 2 have not been determined. Collection of multiple specimens (types and time points) from the same patient may be necessary to detect the virus. Improper specimen collection and handling, sequence variability underlying assay primers and or probes, or the presence of organisms in  quantities less than the limit of detection of the assay may lead to false negative results. Positive and negative predictive values of testing are highly dependent on prevalence. False negative results are more likely when prevalence of disease is high. (NOTE) The expected result is Negative (Not Detected). The SARS CoV 2 test is intended for the presumptive qualitative  detection of nucleic acid from SARS CoV 2 in upper and lower  respir atory specimens. Testing methodology is real time RT PCR. Test results must be correlated with clinical presentation and  evaluated in the context of other laboratory and epidemiologic data.  Test performance can be affected because the epidemiology and  clinical spectrum of infection caused by SARS CoV 2 is not fully  known. For example, the optimum types of specimens to collect and  when during the course  of infection these specimens are most likely  to contain detectable viral RNA may not be known. This test has not been Food and Drug Administration (FDA) cleared or  approved and has been authorized by FDA under an Emergency Use  Authorization (EUA). The test is only authorized for the duration of  the declaration that circumstances exist justifying the authorization  of emergency use of in vitro diagnostic tests for detection and or  diagnosis of SARS CoV 2 under Section 564(b)(1) of the Act, 21 U.S.C.  section [phone] 3(b)(1), unless the authorization is terminated or   revoked sooner. [facility] is certified under the  Clinical Laboratory Improvement Amendments of 1988 (CLIA), 42 U.S.C.  section [phone], to perform high complexity tests. Performed at [place] [id number], [place], [place] [address] Laboratory Director: Loleta Books, MD Fact Sheet for Healthcare Providers  BankingDealers.co.za Fact Sheet for Patients  StrictlyIdeas.no    Coronavirus Source NASOPHARYNGEAL  Final    Comment: Performed at [facility], [address], [place], [place] [address]  Culture, respiratory (non-expectorated)     Status: None   Collection Time: 02/28/19  2:45 PM  Result Value Ref Range Status   Specimen Description TRACHEAL ASPIRATE  Final   Special Requests NONE  Final   Gram Stain   Final    MODERATE WBC PRESENT, PREDOMINANTLY PMN NO ORGANISMS SEEN    Culture   Final    NO GROWTH 2 DAYS Performed at [facility], [place] [address] [place], [place] [address]    Report Status 03/02/2019 FINAL  Final  Culture, blood (Routine X 2) w Reflex to ID Panel     Status: None   Collection Time: 02/28/19  2:50 PM  Result Value Ref Range Status   Specimen Description BLOOD LEFT ANTECUBITAL  Final   Special Requests   Final    BOTTLES DRAWN AEROBIC ONLY Blood Culture  results may not be optimal due to an inadequate volume of blood received in culture bottles   Culture   Final    NO GROWTH 5 DAYS Performed at [facility], [place] [address] [place], [place] [address]    Report Status  03/05/2019 FINAL  Final  Culture, blood (Routine X 2) w Reflex to ID Panel     Status: None   Collection Time: 02/28/19  2:57 PM  Result Value Ref Range Status   Specimen Description BLOOD LEFT ANTECUBITAL  Final   Special Requests   Final    BOTTLES DRAWN AEROBIC ONLY Blood Culture results may not be optimal due to an inadequate volume of blood received in culture bottles   Culture   Final    NO GROWTH 5 DAYS Performed at [facility], [place] [address], [place], [place] [address]    Report Status 03/05/2019 FINAL  Final  MRSA PCR Screening     Status: None   Collection Time: 03/02/19  8:43 AM  Result Value Ref Range Status   MRSA by PCR NEGATIVE NEGATIVE Final    Comment:        The GeneXpert MRSA Assay (FDA approved for NASAL specimens only), is one component of a comprehensive MRSA colonization surveillance program. It is not intended to diagnose MRSA infection nor to guide or monitor treatment for MRSA infections. Performed at [facility], [place] [address] [place], [place] [address]     Studies/Results: Ct Abdomen Pelvis W Wo Contrast  Result Date: 03/04/2019 CLINICAL DATA:  Abdominal pain and fever.  Abscess suspected. EXAM: CT ABDOMEN AND PELVIS WITHOUT AND WITH CONTRAST TECHNIQUE: Multidetector CT imaging of the abdomen and pelvis was performed following the standard protocol before and following the bolus administration of intravenous contrast. CONTRAST:  63mL OMNIPAQUE IOHEXOL 300 MG/ML  SOLN COMPARISON:  01/21/2019 FINDINGS: Lower chest: Patchy airspace disease both lower lungs suggest multifocal pneumonia. Small bilateral pleural effusions evident. Enlargement of the pulmonary arteries suggests pulmonary arterial hypertension.  Hepatobiliary: Insert normal infused liver gallbladder surgically absent. No intrahepatic or extrahepatic biliary dilation. Pancreas: No focal mass lesion. No dilatation of the main duct. No intraparenchymal cyst. No peripancreatic edema. Spleen: No splenomegaly. No focal mass lesion. Adrenals/Urinary Tract: No adrenal nodule or mass. Precontrast imaging shows a 2 mm nonobstructing stone in the lower pole the right kidney. Right ureter unremarkable. 9 x 14 x 9 mm stone is identified in the left renal pelvis (best seen coronal image 83/series 11) with mild to moderate left hydronephrosis. Decreased perfusion to the left kidney is compatible with obstructive uropathy. Probable 12 mm cyst lower pole left kidney, similar to prior. Left ureter unremarkable. The urinary bladder appears normal for the degree of distention. Stomach/Bowel: Stomach is decompressed. Duodenum is normally positioned as is the ligament of Treitz. No small bowel wall thickening. No small bowel dilatation. Colon is nondilated. Rectal tube evident. Vascular/Lymphatic: There is abdominal aortic atherosclerosis without aneurysm. There is no gastrohepatic or hepatoduodenal ligament lymphadenopathy. No intraperitoneal or retroperitoneal lymphadenopathy. No pelvic sidewall lymphadenopathy. Reproductive: The uterus is surgically absent. There is no adnexal mass. Other: No intraperitoneal free fluid. Musculoskeletal: Small umbilical hernia contains only fat. Subcutaneous edema noted lateral right abdominal wall. Status post ORIF left proximal femur. Stable small sclerotic focus right femoral head, likely benign. No worrisome lytic or sclerotic osseous abnormality. IMPRESSION: 1. 9 x 14 x 9 mm left UPJ stone causes mild to moderate left hydronephrosis and evidence of obstructive uropathy. Given the history of fever, infection proximal to the stone is a consideration. 2. Subcutaneous edema identified lateral right abdominal wall. Given the asymmetry,  cellulitis cannot be excluded. 3. Patchy airspace disease in both lower lobes with small bilateral pleural effusions. Multifocal pneumonia consideration. 4. Enlargement  of the main pulmonary arteries suggests pulmonary arterial hypertension. 5.  Aortic Atherosclerois (ICD10-170.0) Electronically Signed   By: Misty Stanley M.D.   On: 03/04/2019 21:20   Dg Chest Port 1 View  Result Date: 03/05/2019 CLINICAL DATA:  PICC line placement. EXAM: PORTABLE CHEST 1 VIEW COMPARISON:  03/04/2019 FINDINGS: 2156 hours. Right PICC line tip is overlying the mid SVC level. Marked interval improvement and bilateral airspace disease seen previously with only minimal residual in the right base currently. Probable tiny bilateral pleural effusions. Cardiopericardial silhouette is at upper limits of normal for size. The visualized bony structures of the thorax are intact. Telemetry leads overlie the chest. IMPRESSION: 1. Marked interval decrease in bilateral diffuse airspace opacity. 2. Right PICC line tip overlies the mid SVC level. Electronically Signed   By: Misty Stanley M.D.   On: 03/05/2019 21:33   [place] Ekg Site Rite  Result Date: 03/05/2019 If Site Rite image not attached, placement could not be confirmed due to current cardiac rhythm.  I reviewed the CT images.   Assessment/Plan:  Left UPJ stone- large stone not likely to pass, so ureteral stent makes sense to go ahead and drain the kidney versus outpatient follow-up for management.  Looks like she is coming along and could get the stent Monday to prepare for a staged ureteroscopy.  No fever, white count is down today and creatinine 0.8.  Overall she looks good.  In the long run, as far as the kidney stone I discussed with her the stent is to drain the kidney and prepare for staged outpatient ureteroscopy.  Ureteroscopy can be performed while she is on blood thinners.  Shockwave lithotripsy not the best option in this situation as the airway is more tenuous during that  procedure with the sedation and requires cessation of anticoagulation.  Will follow.   LOS: 12 days   Festus Aloe 03/06/2019, 2:09 PM

## 2019-03-06 NOTE — Progress Notes (Signed)
MD called and gave order to stop amiodarone gtt once po given at 8 pm.

## 2019-03-06 NOTE — Progress Notes (Signed)
  Speech Language Pathology Treatment: Dysphagia  Patient Details Name: Martha Mora MRN: [id number] DOB: 1945-12-25 Today's Date: 03/06/2019 Time:  -     Assessment / Plan / Recommendation Clinical Impression  Patient is tolerating regular diet with thin liquids. Observation with snack of water and crackers. No s/s of aspiration and clear vocal quality throughout session. All swallowing goals are met. Pt dc'd from speech therapy.   HPI HPI: 73 y.o. female admitted for fever and SOB; dx septic shock from E coli bacteremia, E coli UTI, hypokalemia. Developed respiratory distress, transferred to ICU and intubated 4/6-15. COVID test (-). Chronic conditions include COPD, anxiety, MVR, HTN, GERD, incontinence, HLD, obesity, restless leg syndrome.       SLP Plan  All goals met;Discharge SLP treatment due to (comment)(all goals met)       Recommendations  Diet recommendations: Regular;Thin liquid Liquids provided via: Cup;Straw Medication Administration: Whole meds with liquid Supervision: Patient able to self feed Compensations: Slow rate;Small sips/bites Postural Changes and/or Swallow Maneuvers: Seated upright 90 degrees                Plan: All goals met;Discharge SLP treatment due to (comment)(all goals met)       [place], MA, CCC-SLP 03/06/2019 10:57 AM

## 2019-03-07 LAB — BASIC METABOLIC PANEL
Anion gap: 9 (ref 5–15)
BUN: 14 mg/dL (ref 8–23)
CO2: 32 mmol/L (ref 22–32)
Calcium: 8.9 mg/dL (ref 8.9–10.3)
Chloride: 100 mmol/L (ref 98–111)
Creatinine, Ser: 0.86 mg/dL (ref 0.44–1.00)
GFR calc Af Amer: >60 mL/min (ref >60)
GFR calc non Af Amer: >60 mL/min (ref >60)
Glucose, Bld: 132 mg/dL — ABNORMAL HIGH (ref 70–99)
Potassium: 3.1 mmol/L — ABNORMAL LOW (ref 3.5–5.1)
Sodium: 141 mmol/L (ref 135–145)

## 2019-03-07 LAB — HEPARIN LEVEL (UNFRACTIONATED): Heparin Unfractionated: 0.7 IU/mL (ref 0.30–0.70)

## 2019-03-07 LAB — CBC
HCT: 32 % — ABNORMAL LOW (ref 36.0–46.0)
Hemoglobin: 9.1 g/dL — ABNORMAL LOW (ref 12.0–15.0)
MCH: 25.3 pg — ABNORMAL LOW (ref 26.0–34.0)
MCHC: 28.4 g/dL — ABNORMAL LOW (ref 30.0–36.0)
MCV: 88.9 fL (ref 80.0–100.0)
Platelets: 398 10*3/uL (ref 150–400)
RBC: 3.6 MIL/uL — ABNORMAL LOW (ref 3.87–5.11)
RDW: 17.4 % — ABNORMAL HIGH (ref 11.5–15.5)
WBC: 18.7 10*3/uL — ABNORMAL HIGH (ref 4.0–10.5)
nRBC: 0.2 % (ref 0.0–0.2)

## 2019-03-07 LAB — SURGICAL PCR SCREEN
MRSA, PCR: NEGATIVE
Staphylococcus aureus: NEGATIVE

## 2019-03-07 LAB — APTT: aPTT: 76 seconds — ABNORMAL HIGH (ref 24–36)

## 2019-03-07 LAB — MAGNESIUM: Magnesium: 2.1 mg/dL (ref 1.7–2.4)

## 2019-03-07 MED ORDER — POTASSIUM CHLORIDE CRYS ER 20 MEQ PO TBCR
40.0000 meq | EXTENDED_RELEASE_TABLET | Freq: Three times a day (TID) | ORAL | Status: DC
  Administered 2019-03-07: 40 meq via ORAL
  Filled 2019-03-07: qty 2

## 2019-03-07 MED ORDER — SENNOSIDES-DOCUSATE SODIUM 8.6-50 MG PO TABS: 1.0000 | ORAL_TABLET | Freq: Every evening | ORAL | Status: DC | PRN

## 2019-03-07 MED ORDER — TRAZODONE HCL 50 MG PO TABS
50.0000 mg | ORAL_TABLET | Freq: Every day | ORAL | Status: DC
  Administered 2019-03-07 – 2019-03-09 (×3): 50 mg via ORAL
  Filled 2019-03-07 (×3): qty 1

## 2019-03-07 MED ORDER — NON FORMULARY: 3.0000 mg | Freq: Every evening | Status: DC | PRN

## 2019-03-07 MED ORDER — FUROSEMIDE 40 MG PO TABS
40.0000 mg | ORAL_TABLET | Freq: Every day | ORAL | Status: DC
  Administered 2019-03-08 – 2019-03-10 (×3): 40 mg via ORAL
  Filled 2019-03-07 (×3): qty 1

## 2019-03-07 MED ORDER — POTASSIUM CHLORIDE 20 MEQ PO PACK
40.0000 meq | PACK | Freq: Two times a day (BID) | ORAL | Status: AC
  Administered 2019-03-07 (×2): 40 meq via ORAL
  Filled 2019-03-07 (×2): qty 2

## 2019-03-07 MED ORDER — MELATONIN 3 MG PO TABS
3.0000 mg | ORAL_TABLET | Freq: Every evening | ORAL | Status: DC | PRN
  Administered 2019-03-09 (×2): 3 mg via ORAL
  Filled 2019-03-07 (×4): qty 1

## 2019-03-07 NOTE — Progress Notes (Signed)
ANTICOAGULATION CONSULT NOTE - Follow up [place] for heparin Indication: chest pain/ACS  Allergies  Allergen Reactions  . Keflex [Cephalexin] Other (See Comments)    "Made me feel funny"  . Scallops [Shellfish Allergy] Nausea And Vomiting  . Penicillins Rash and Other (See Comments)    03/01/19 tolerated Zosyn Red bumps all over stomach Has patient had a PCN reaction causing immediate rash, facial/tongue/throat swelling, SOB or lightheadedness with hypotension: Yes Has patient had a PCN reaction causing severe rash involving mucus membranes or skin necrosis: No Has patient had a PCN reaction that required hospitalization: No Has patient had a PCN reaction occurring within the last 10 years: No If all of the above answers are "NO", then may proceed with Cephalosporin use.    Patient Measurements: Height: 4\' 11"  (149.9 cm) Weight: 212 lb 15.4 oz (96.6 kg) IBW/kg (Calculated) : 43.2 Heparin Dosing Weight: 71.5 kg  Vital Signs: Temp: 98.5 F (36.9 C) (04/19 1118) Temp Source: Oral (04/19 1118) BP: 127/85 (04/19 1118) Pulse Rate: 65 (04/19 1118)  Labs: Recent Labs    03/05/19 0832 03/06/19 0545 03/07/19 0530  HGB  --  8.8* 9.1*  HCT  --  31.2* 32.0*  PLT  --  397 398  APTT  --  70* 76*  HEPARINUNFRC  --  1.01* 0.70  CREATININE 1.12* 0.87 0.86    Estimated Creatinine Clearance: 57.6 mL/min (by C-G formula based on SCr of 0.86 mg/dL).  Assessment: 58 yof to restart heparin for new afib. She was transitioned to apixaban several days ago, last dose 4/17 AM. Possible urology stent placement so new orders received to change back to heparin. Will restart previous rate that she was at goal.  No cbc this am. Will monitor and adjust dose based on aptt until heparin levels correlate.   Heparin level and aPTT therapeutic on heparin 1900 units/hr. CBC stable. No signs/symptoms of bleeding.   Goal of Therapy:  aptt goal 66-102s Heparin level 0.3-0.7  units/ml Monitor platelets by anticoagulation protocol: Yes   Plan:  Continue heparin infusion at 1900 units/hr As aPTT and HL correlating, will DC aPTT monitoring. Daily HL and CBC while on heparin Monitor for bleeding complications  Thanks for allowing pharmacy to be a part of this patient's care.   Claiborne Billings, PharmD PGY2 Cardiology Pharmacy Resident Please check AMION for all Pharmacist numbers by unit 03/07/2019 12:39 PM

## 2019-03-07 NOTE — Consult Note (Signed)
Ref: Martha Percy, DO   Subjective:  Awake. C/O weakness. Mild respiratory distress. Hypokalemia persist.   Objective:  Vital Signs in the last 24 hours: Temp:  [97.8 F (36.6 C)-98.6 F (37 C)] 98.5 F (36.9 C) (04/19 1118) Pulse Rate:  [63-75] 65 (04/19 1118) Cardiac Rhythm: Normal sinus rhythm (04/19 1228) Resp:  [18-28] 27 (04/19 1118) BP: (127-161)/(55-85) 127/85 (04/19 1118) SpO2:  [92 %-98 %] 98 % (04/19 1118) Weight:  [96.6 kg] 96.6 kg (04/19 0357)  Physical Exam: BP Readings from Last 1 Encounters:  03/07/19 127/85     Wt Readings from Last 1 Encounters:  03/07/19 96.6 kg    Weight change: -1.8 kg Body mass index is 43.01 kg/m. HEENT: [place]/AT, Eyes-Brown, PERL, EOMI, Conjunctiva-Pale, Sclera-Non-icteric Neck: No JVD, No bruit, Trachea midline. Lungs:  Clearing, Bilateral. Cardiac:  Regular rhythm, normal S1 and S2, no S3. II/VI systolic murmur. Abdomen:  Soft, non-tender. BS present. Extremities:  No edema present. No cyanosis. No clubbing. CNS: AxOx3, Cranial nerves grossly intact, moves all 4 extremities.  Skin: Warm and dry.   Intake/Output from previous day: 04/18 0701 - 04/19 0700 In: 746.7 [P.O.:240; I.V.:306.7; IV Piggyback:200] Out: 2350 [Urine:2350]    Lab Results: BMET    Component Value Date/Time   NA 141 03/07/2019 0530   NA 143 03/06/2019 0545   NA 149 (H) 03/05/2019 0832   NA 141 08/08/2017 1009   NA 140 02/05/2017 0941   NA 142 08/05/2016 1022   K 3.1 (L) 03/07/2019 0530   K 3.3 (L) 03/06/2019 0545   K 3.9 03/05/2019 0832   K 4.0 08/08/2017 1009   K 4.1 02/05/2017 0941   K 4.1 08/05/2016 1022   CL 100 03/07/2019 0530   CL 99 03/06/2019 0545   CL 103 03/05/2019 0832   CO2 32 03/07/2019 0530   CO2 33 (H) 03/06/2019 0545   CO2 33 (H) 03/05/2019 0832   CO2 25 08/08/2017 1009   CO2 23 02/05/2017 0941   CO2 25 08/05/2016 1022   GLUCOSE 132 (H) 03/07/2019 0530   GLUCOSE 151 (H) 03/06/2019 0545   GLUCOSE 168 (H) 03/05/2019 0832    GLUCOSE 95 08/08/2017 1009   GLUCOSE 120 02/05/2017 0941   GLUCOSE 107 08/05/2016 1022   BUN 14 03/07/2019 0530   BUN 18 03/06/2019 0545   BUN 23 03/05/2019 0832   BUN 21.7 08/08/2017 1009   BUN 14.4 02/05/2017 0941   BUN 12.0 08/05/2016 1022   CREATININE 0.86 03/07/2019 0530   CREATININE 0.87 03/06/2019 0545   CREATININE 1.12 (H) 03/05/2019 0832   CREATININE 0.8 08/08/2017 1009   CREATININE 0.8 02/05/2017 0941   CREATININE 0.8 08/05/2016 1022   CALCIUM 8.9 03/07/2019 0530   CALCIUM 8.9 03/06/2019 0545   CALCIUM 9.0 03/05/2019 0832   CALCIUM 9.0 08/08/2017 1009   CALCIUM 9.4 02/05/2017 0941   CALCIUM 8.8 08/05/2016 1022   GFRNONAA >60 03/07/2019 0530   GFRNONAA >60 03/06/2019 0545   GFRNONAA 48 (L) 03/05/2019 0832   GFRNONAA 82 10/23/2015 1158   GFRAA >60 03/07/2019 0530   GFRAA >60 03/06/2019 0545   GFRAA 56 (L) 03/05/2019 0832   GFRAA >89 10/23/2015 1158   CBC    Component Value Date/Time   WBC 18.7 (H) 03/07/2019 0530   RBC 3.60 (L) 03/07/2019 0530   HGB 9.1 (L) 03/07/2019 0530   HGB 12.7 12/10/2018 0844   HGB 12.8 08/08/2017 1009   HCT 32.0 (L) 03/07/2019 0530   HCT 39.8 12/10/2018  0844   HCT 39.8 08/08/2017 1009   PLT 398 03/07/2019 0530   PLT 297 12/10/2018 0844   MCV 88.9 03/07/2019 0530   MCV 83 12/10/2018 0844   MCV 93.9 08/08/2017 1009   MCH 25.3 (L) 03/07/2019 0530   MCHC 28.4 (L) 03/07/2019 0530   RDW 17.4 (H) 03/07/2019 0530   RDW 14.9 12/10/2018 0844   RDW 14.8 (H) 08/08/2017 1009   LYMPHSABS 2.4 03/06/2019 0545   LYMPHSABS 3.2 08/08/2017 1009   MONOABS 1.2 (H) 03/06/2019 0545   MONOABS 1.2 (H) 08/08/2017 1009   EOSABS 0.7 (H) 03/06/2019 0545   EOSABS 0.1 08/08/2017 1009   BASOSABS 0.1 03/06/2019 0545   BASOSABS 0.1 08/08/2017 1009   HEPATIC Function Panel Recent Labs    02/27/19 0236 02/28/19 0500 03/01/19 0500  PROT 5.9* 5.6* 5.4*   HEMOGLOBIN A1C No components found for: HGA1C,  MPG CARDIAC ENZYMES Lab Results  Component  Value Date   TROPONINI 1.82 (HH) 03/04/2019   TROPONINI 2.18 (HH) 03/03/2019   TROPONINI 1.83 (HH) 03/03/2019   BNP No results for input(s): PROBNP in the last 8760 hours. TSH Recent Labs    10/06/18 1051  TSH 1.450   CHOLESTEROL Recent Labs    10/06/18 1051  CHOL 153    Scheduled Meds: . amiodarone  200 mg Oral BID  . Chlorhexidine Gluconate Cloth  6 each Topical Daily  . citalopram  40 mg Oral Daily  . feeding supplement (ENSURE ENLIVE)  237 mL Oral Q24H  . [START ON 03/08/2019] furosemide  40 mg Oral Daily  . losartan  50 mg Oral Daily  . mouth rinse  15 mL Mouth Rinse BID  . multivitamin with minerals  1 tablet Oral Daily  . nicotine  21 mg Transdermal Daily  . pantoprazole  40 mg Oral Daily  . potassium chloride  40 mEq Oral BID  . ramelteon  8 mg Oral QHS  . rOPINIRole  3 mg Oral QHS  . rosuvastatin  20 mg Oral Daily  . sodium chloride flush  10-40 mL Intracatheter Q12H  . traZODone  50 mg Oral QHS   Continuous Infusions: . cefTRIAXone (ROCEPHIN)  IV 2 g (03/07/19 1357)  . heparin 1,900 Units/hr (03/07/19 1200)   PRN Meds:.acetaminophen, albuterol, hydrALAZINE, Melatonin, ondansetron (ZOFRAN) IV, polyvinyl alcohol, senna-docusate, sodium chloride flush  Assessment/Plan: Paroxysmal atrial fibrillation Acute on chronic respiratory failure with hypoxemia COPD Acute left heart diastoloic failure E.Coli sepsis Urolithiasis CAD S/P Stent in LAD Obesity OSA Troponin-I from demand ischemia Hypokalemia Type 2 DM  Awaiting urology procedure tomorrow. Agree with potassium supplement. Change IV lasix to PO.   LOS: 13 days    [name]  MD  03/07/2019, 2:16 PM

## 2019-03-07 NOTE — Progress Notes (Signed)
Subjective: Patient reports she is doing well. No flank pain.   Objective: Vital signs in last 24 hours: Temp:  [97.8 F (36.6 C)-98.6 F (37 C)] 98.5 F (36.9 C) (04/19 1118) Pulse Rate:  [63-75] 65 (04/19 1118) Resp:  [18-28] 27 (04/19 1118) BP: (127-161)/(55-85) 127/85 (04/19 1118) SpO2:  [92 %-98 %] 98 % (04/19 1118) Weight:  [96.6 kg] 96.6 kg (04/19 0357)  Intake/Output from previous day: 04/18 0701 - 04/19 0700 In: 746.7 [P.O.:240; I.V.:306.7; IV Piggyback:200] Out: 2350 [Urine:2350] Intake/Output this shift: Total I/O In: 196 [P.O.:120; I.V.:76] Out: 200 [Urine:200]  Physical Exam:  She looks well  In bed watching TV No CVAT   Lab Results: Recent Labs    03/06/19 0545 03/07/19 0530  HGB 8.8* 9.1*  HCT 31.2* 32.0*   BMET Recent Labs    03/06/19 0545 03/07/19 0530  NA 143 141  K 3.3* 3.1*  CL 99 100  CO2 33* 32  GLUCOSE 151* 132*  BUN 18 14  CREATININE 0.87 0.86  CALCIUM 8.9 8.9   No results for input(s): LABPT, INR in the last 72 hours. No results for input(s): LABURIN in the last 72 hours. Results for orders placed or performed during the hospital encounter of 02/21/19  Blood culture (routine x 2)     Status: Abnormal   Collection Time: 02/21/19  2:52 PM  Result Value Ref Range Status   Specimen Description BLOOD RIGHT HAND  Final   Special Requests   Final    BOTTLES DRAWN AEROBIC AND ANAEROBIC Blood Culture results may not be optimal due to an inadequate volume of blood received in culture bottles   Culture  Setup Time   Final    GRAM NEGATIVE RODS IN BOTH AEROBIC AND ANAEROBIC BOTTLES CRITICAL VALUE NOTED.  VALUE IS CONSISTENT WITH PREVIOUSLY REPORTED AND CALLED VALUE.    Culture (A)  Final    ESCHERICHIA COLI SUSCEPTIBILITIES PERFORMED ON PREVIOUS CULTURE WITHIN THE LAST 5 DAYS. Performed at [facility], [place] [address], [place], [place] [address]    Report Status 02/24/2019 FINAL  Final  Urine Culture     Status: Abnormal   Collection Time: 02/21/19  2:57 PM  Result Value Ref Range Status   Specimen Description URINE, RANDOM  Final   Special Requests   Final    NONE Performed at [facility], [place] [address], [place], [place] [address]    Culture >=100,000 COLONIES/mL ESCHERICHIA COLI (A)  Final   Report Status 02/23/2019 FINAL  Final   Organism ID, Bacteria ESCHERICHIA COLI (A)  Final      Susceptibility   Escherichia coli - MIC*    AMPICILLIN >=32 RESISTANT Resistant     CEFAZOLIN <=4 SENSITIVE Sensitive     CEFTRIAXONE <=1 SENSITIVE Sensitive     CIPROFLOXACIN <=0.25 SENSITIVE Sensitive     GENTAMICIN <=1 SENSITIVE Sensitive     IMIPENEM <=0.25 SENSITIVE Sensitive     NITROFURANTOIN <=16 SENSITIVE Sensitive     TRIMETH/SULFA <=20 SENSITIVE Sensitive     AMPICILLIN/SULBACTAM >=32 RESISTANT Resistant     PIP/TAZO <=4 SENSITIVE Sensitive     Extended ESBL NEGATIVE Sensitive     * >=100,000 COLONIES/mL ESCHERICHIA COLI  Blood culture (routine x 2)     Status: Abnormal   Collection Time: 02/21/19  2:59 PM  Result Value Ref Range Status   Specimen Description BLOOD RIGHT HAND  Final   Special Requests   Final    BOTTLES DRAWN AEROBIC  AND ANAEROBIC Blood Culture adequate volume Performed at [facility] [address] [place], [place] [address]    Culture  Setup Time   Final    GRAM NEGATIVE RODS IN BOTH AEROBIC AND ANAEROBIC BOTTLES CRITICAL RESULT CALLED TO, READ BACK BY AND VERIFIED WITH: PHRMD L SEAY @0326  02/22/19 BY S GEZAHEGN    Culture ESCHERICHIA COLI (A)  Final   Report Status 02/24/2019 FINAL  Final   Organism ID, Bacteria ESCHERICHIA COLI  Final      Susceptibility   Escherichia coli - MIC*    AMPICILLIN >=32 RESISTANT Resistant     CEFAZOLIN <=4 SENSITIVE Sensitive     CEFEPIME <=1 SENSITIVE Sensitive     CEFTAZIDIME <=1 SENSITIVE Sensitive     CEFTRIAXONE <=1 SENSITIVE Sensitive     CIPROFLOXACIN <=0.25 SENSITIVE Sensitive     GENTAMICIN <=1 SENSITIVE Sensitive      IMIPENEM <=0.25 SENSITIVE Sensitive     TRIMETH/SULFA <=20 SENSITIVE Sensitive     AMPICILLIN/SULBACTAM >=32 RESISTANT Resistant     PIP/TAZO <=4 SENSITIVE Sensitive     Extended ESBL NEGATIVE Sensitive     * ESCHERICHIA COLI  Blood Culture ID Panel (Reflexed)     Status: Abnormal   Collection Time: 02/21/19  2:59 PM  Result Value Ref Range Status   Enterococcus species NOT DETECTED NOT DETECTED Final   Listeria monocytogenes NOT DETECTED NOT DETECTED Final   Staphylococcus species NOT DETECTED NOT DETECTED Final   Staphylococcus aureus (BCID) NOT DETECTED NOT DETECTED Final   Streptococcus species NOT DETECTED NOT DETECTED Final   Streptococcus agalactiae NOT DETECTED NOT DETECTED Final   Streptococcus pneumoniae NOT DETECTED NOT DETECTED Final   Streptococcus pyogenes NOT DETECTED NOT DETECTED Final   Acinetobacter baumannii NOT DETECTED NOT DETECTED Final   Enterobacteriaceae species DETECTED (A) NOT DETECTED Final    Comment: Enterobacteriaceae represent a large family of gram-negative bacteria, not a single organism. CRITICAL RESULT CALLED TO, READ BACK BY AND VERIFIED WITH: PHRMD L SEAY @0326  02/22/19 BY S GEZAHEGN    Enterobacter cloacae complex NOT DETECTED NOT DETECTED Final   Escherichia coli DETECTED (A) NOT DETECTED Final    Comment: CRITICAL RESULT CALLED TO, READ BACK BY AND VERIFIED WITH: PHRMD L SEAY @0326  02/22/19 BY S GEZAHEGN    Klebsiella oxytoca NOT DETECTED NOT DETECTED Final   Klebsiella pneumoniae NOT DETECTED NOT DETECTED Final   Proteus species NOT DETECTED NOT DETECTED Final   Serratia marcescens NOT DETECTED NOT DETECTED Final   Carbapenem resistance NOT DETECTED NOT DETECTED Final   Haemophilus influenzae NOT DETECTED NOT DETECTED Final   Neisseria meningitidis NOT DETECTED NOT DETECTED Final   Pseudomonas aeruginosa NOT DETECTED NOT DETECTED Final   Candida albicans NOT DETECTED NOT DETECTED Final   Candida glabrata NOT DETECTED NOT DETECTED Final    Candida krusei NOT DETECTED NOT DETECTED Final   Candida parapsilosis NOT DETECTED NOT DETECTED Final   Candida tropicalis NOT DETECTED NOT DETECTED Final    Comment: Performed at [facility], [place] [address], [place], [place] [address]  Novel Coronavirus, NAA (hospital order; send-out to ref lab)     Status: None   Collection Time: 02/21/19  4:51 PM  Result Value Ref Range Status   SARS-CoV-2, NAA NOT DETECTED NOT DETECTED Final    Comment: Negative (Not Detected) results do not exclude infection caused by SARS CoV 2 and should not be used as the sole basis for treatment or other patient management  decisions. Optimum specimen types and timing for peak viral levels during infections caused  by SARS CoV 2 have not been determined. Collection of multiple specimens (types and time points) from the same patient may be necessary to detect the virus. Improper specimen collection and handling, sequence variability underlying assay primers and or probes, or the presence of organisms in  quantities less than the limit of detection of the assay may lead to false negative results. Positive and negative predictive values of testing are highly dependent on prevalence. False negative results are more likely when prevalence of disease is high. (NOTE) The expected result is Negative (Not Detected). The SARS CoV 2 test is intended for the presumptive qualitative  detection of nucleic acid from SARS CoV 2 in upper and lower  respir atory specimens. Testing methodology is real time RT PCR. Test results must be correlated with clinical presentation and  evaluated in the context of other laboratory and epidemiologic data.  Test performance can be affected because the epidemiology and  clinical spectrum of infection caused by SARS CoV 2 is not fully  known. For example, the optimum types of specimens to collect and  when during the course of infection these specimens are most likely  to contain detectable  viral RNA may not be known. This test has not been Food and Drug Administration (FDA) cleared or  approved and has been authorized by FDA under an Emergency Use  Authorization (EUA). The test is only authorized for the duration of  the declaration that circumstances exist justifying the authorization  of emergency use of in vitro diagnostic tests for detection and or  diagnosis of SARS CoV 2 under Section 564(b)(1) of the Act, 21 U.S.C.  section [phone] 3(b)(1), unless the authorization is terminated or   revoked sooner. [facility] is certified under the  Clinical Laboratory Improvement Amendments of 1988 (CLIA), 42 U.S.C.  section [phone], to perform high complexity tests. Performed at [facility] [id number], [place], [place] [address] Laboratory Director: Loleta Books, MD Fact Sheet for Healthcare Providers  BankingDealers.co.za Fact Sheet for Patients  StrictlyIdeas.no    Coronavirus Source NASOPHARYNGEAL  Final    Comment: Performed at [facility], [address], [place], [place] [address]  Culture, respiratory (non-expectorated)     Status: None   Collection Time: 02/28/19  2:45 PM  Result Value Ref Range Status   Specimen Description TRACHEAL ASPIRATE  Final   Special Requests NONE  Final   Gram Stain   Final    MODERATE WBC PRESENT, PREDOMINANTLY PMN NO ORGANISMS SEEN    Culture   Final    NO GROWTH 2 DAYS Performed at [facility], [place] [address] [place], [place] [address]    Report Status 03/02/2019 FINAL  Final  Culture, blood (Routine X 2) w Reflex to ID Panel     Status: None   Collection Time: 02/28/19  2:50 PM  Result Value Ref Range Status   Specimen Description BLOOD LEFT ANTECUBITAL  Final   Special Requests   Final    BOTTLES DRAWN AEROBIC ONLY Blood Culture results may not be optimal due to an inadequate volume of blood  received in culture bottles   Culture   Final    NO GROWTH 5 DAYS Performed at [facility], [place] [address] [place],  [address]    Report Status 03/05/2019 FINAL  Final  Culture, blood (  Routine X 2) w Reflex to ID Panel     Status: None   Collection Time: 02/28/19  2:57 PM  Result Value Ref Range Status   Specimen Description BLOOD LEFT ANTECUBITAL  Final   Special Requests   Final    BOTTLES DRAWN AEROBIC ONLY Blood Culture results may not be optimal due to an inadequate volume of blood received in culture bottles   Culture   Final    NO GROWTH 5 DAYS Performed at [facility], [place] [address] [place], [place] [address]    Report Status 03/05/2019 FINAL  Final  MRSA PCR Screening     Status: None   Collection Time: 03/02/19  8:43 AM  Result Value Ref Range Status   MRSA by PCR NEGATIVE NEGATIVE Final    Comment:        The GeneXpert MRSA Assay (FDA approved for NASAL specimens only), is one component of a comprehensive MRSA colonization surveillance program. It is not intended to diagnose MRSA infection nor to guide or monitor treatment for MRSA infections. Performed at [facility], [place] [address], [place], [place] [address]     Studies/Results: Dg Chest Port 1 View  Result Date: 03/05/2019 CLINICAL DATA:  PICC line placement. EXAM: PORTABLE CHEST 1 VIEW COMPARISON:  03/04/2019 FINDINGS: 2156 hours. Right PICC line tip is overlying the mid SVC level. Marked interval improvement and bilateral airspace disease seen previously with only minimal residual in the right base currently. Probable tiny bilateral pleural effusions. Cardiopericardial silhouette is at upper limits of normal for size. The visualized bony structures of the thorax are intact. Telemetry leads overlie the chest. IMPRESSION: 1. Marked interval decrease in bilateral diffuse airspace opacity. 2. Right PICC line tip overlies the mid SVC level. Electronically Signed   By: Misty Stanley M.D.    On: 03/05/2019 21:33   [place] Ekg Site Rite  Result Date: 03/05/2019 If Site Rite image not attached, placement could not be confirmed due to current cardiac rhythm.   Assessment/Plan:  Left UPJ stone  - she is stable. I spoke with FMR MD and cardiology in dictation room and both feel stable for anesthesia/stent. Discussed with patient again rational for left stent and staged treatment of the stone, the nature r/b/a to cystoscopy and left stent. She elects to proceed. Posted for Dr. Matilde Sprang for tomorrow who will resume care.     LOS: 13 days   Festus Aloe 03/07/2019, 12:23 PM

## 2019-03-07 NOTE — Progress Notes (Addendum)
Family Medicine Teaching Service Daily Progress Note Intern Pager: [phone]  Patient name: Martha Mora Medical record number: [id number] Date of birth: 1946/07/08 Age: 73 y.o. Gender: female  Primary Care Provider: [name], DO Consultants: CCM Code Status: Full   Pt Overview and Major Events to Date:  Admitted to [facility] on 4/5 Transfer of care to CCM of 4/6 and intubated Extubated on 4/15 New onset afib 4/15   Assessment and Plan: Martha Mora is a 73 y.o. female admitted for fever and SOB and diagnosed with urosepsis. Also found to have LLL haziness on CXR.  Her chronic conditions include COPD, anxiety, MVR, HTN, GERD, incontinence, HLD, obesity, restless leg syndrome.   E coli bacteremia 2/2 UTI, Obstructing Left Renal Stone S/p rocephin 4/6-4/11, vanc 4/12-4/16, zosyn 4/12-4/16. Now currently on rocephin (4/16 - ). CT on 3/5 showing 90mm non obstructing left stone with no hydronephrosis.  CT on 4/16 showing left UPJ stone causing left hydronephrosis.  Urology planning for ureteral stent on 4/20.  Patient remains afebrile. WBC this AM is stable at 18.7.  Regarding procedure tomorrow, patient is medically stable. -continue rocephin (4/16 - ), plan to de-escalate after procedure and repeat urine cx -Urology following, appreciate recs -planning for ureteral stent 4/20 -NPO at 12am for procedure 4/20 -will opt to not repeat urine culture given improvement and pending procedure -holding eliquis, on hep gtt pending procedure - per urology can d/c likely 1-2 days after surgery and patient will have ureterosocpy as outpatient, she is able to be on Eliquis for this procedure   Acute hypoxemic respiratory failure  Resolved. In setting of septic shock. Patient intubated on 4/6 with successful extubation on 4/15. COVID negative. No h/o home O2 use. On 3L at present.  Respiratory status continues to improve. -continue PRN albuterol  -continue PRN O2, goal of O2 sat 88-95%, wean as  tolerated  Afib  New onset. Patient also with elevated troponins during ICU admission, 2/2 demand ischemia. CHADSVASC 6. H/o CAD s/p RCA stent.  HR 75, NSR. -continuous cardiac monitoring  - ASA at home for hx DES, holding ASA for poss procedure, will need to determine if needed on d/c as she will be d/c'ed on Eliquis - holding eliquis for possible procedure - on hep gtt - transitioned to amiodarone PO 200mg  BID yesterday, will continue x 1 week and then decrease to QD, per cards recs  Acute diastolic CHF No prior history CHF.  EF 4/10 showed EF 55%. UOP 2.35 L on 4/18.  Cr 0.86, still at baseline, therefore will continue Lasix 40mg  BID pending cards recs and bump in Cr. -Furosemide 40 mg IV twice a day, plan to discontinue today and transition to po, will clarify with cards the dosing -Strict I/Os, daily weights, telemetry  -Daily monitoring renal function -cardiology consulted, appreciate recommendations   Diarrhea Patient noting some diarrhea, x2 this AM.  No fever or increase in WBC.  Was on senna BID scheduled.  Nurse notes that diarrhea has not been clearly malodorous to suggest C diff.  Given patient's extended, complicated stay, would have low threshold for testing, although doubt at this time and will watch for improvement without senna scheduled. - if continues to have diarrhea despite d/c'ing senna, would consider C diff testing - dec senna to qhs prn  AKI: Resolved Basline ~ 0.8, this AM Cr 0.86.  -monitor BMP  Hypernatremia: Resolved Na 141 this AM.  -lasix 40 bid IV - cont to monitor BMP QD  Hypokalemia  K 3.3>3.1 this AM.  Mag has been WNL, most recently 4/16 was 2.4.  Likely 2/2 diuresis and decrease in K containing fluids yesterday in setting of increased PO intake. -K-Dur 40 mEq x3 -check Mag -follow on daily BMP  COPD Albuterol PRN at home. Smoking history with recent vaping use.  -continue albuterol PRN -encourage smoking cessation   HTN Home meds:  amlodipine 5mg  daily, coreg 3.125 mg bid, losartan 100 mg nightly. BP 147/66. 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 Followed by Dr. [name] (outpatiet cardiology). Placed on aspirin 325 by Dr. [name] during visit in September 2019.  Was on DAPT x 1 year. ASCVD risk or is 21.3%. -holding ASA in setting of eliquis, holding eliquis in setting of poss procedure, on hep gtt -continue statin  -cardiology consulted, appreciate recommendations  - will speak with cards prior to d/c regarding ASA use on discharge  Anemia Likely 2/2 acute illness.  Hgb 8.8>pending.  -follow on daily CBC  Restless leg syndrome  Home meds: Requip 3mg  qhs -continue requip   Anxiety Home meds: celexa 40mg  daily, trazodone 50mg  qhs PRN.  Patient complaining of insomnia, therefore trazodone was increased to 100mg  last PM, patient very sleepy this AM.  Will opt to decrease back to home dose and start melatonin. -continue home meds  - melatonin 3mg  QHS prn  FEN/GI: regular diet, senna qhs prn PPx: hep gtt  Disposition: continued inpatient, home with [facility] pending d/c  Subjective:  Patient sleepy and states that she had a difficult time sleeping last PM.  States that she is feeling "great."  No SOB.  Does state that she would like her senna decrease to as needed because she is having a lot of bowel movements.  Objective: Temp:  [97.8 F (36.6 C)-98.7 F (37.1 C)] 98.6 F (37 C) (04/19 0725) Pulse Rate:  [61-75] 75 (04/19 0725) Resp:  [18-28] 19 (04/19 0725) BP: (126-161)/(55-66) 147/66 (04/19 0725) SpO2:  [92 %-98 %] 96 % (04/19 0725) Weight:  [96.6 kg] 96.6 kg (04/19 0357)  Physical Exam: General: 73 y.o. female in NAD, lying in bed with head elevated Cardio: RRR no m/r/g Lungs: CTAB, no  wheezing, no rhonchi, no crackles, on 3L per  Abdomen: Soft, non-tender to palpation, positive bowel sounds Skin: warm and dry Extremities: No edema Neuro: A&Ox3, sleepy initially, but woke up quickly and conversing appropriately   Laboratory: Recent Labs  Lab 03/03/19 0434 03/04/19 0424 03/06/19 0545  WBC 24.9* 23.8* 18.6*  HGB 8.7* 8.8* 8.8*  HCT 29.5* 30.2* 31.2*  PLT 389 458* 397   Recent Labs  Lab 03/01/19 0500  03/05/19 0832 03/06/19 0545 03/07/19 0530  NA 154*   < > 149* 143 141  K 2.8*   < > 3.9 3.3* 3.1*  CL 102   < > 103 99 100  CO2 40*   < > 33* 33* 32  BUN 41*   < > 23 18 14   CREATININE 1.22*   < > 1.12* 0.87 0.86  CALCIUM 8.5*   < > 9.0 8.9 8.9  PROT 5.4*  --   --   --   --   BILITOT 0.7  --   --   --   --   ALKPHOS 151*  --   --   --   --  ALT 50*  --   --   --   --   AST 100*  --   --   --   --   GLUCOSE 133*   < > 168* 151* 132*   < > = values in this interval not displayed.     Imaging/Diagnostic Tests: No results found.  [name], [name], DO 03/07/2019, 7:52 AM PGY-1, [place] Intern pager: [phone], text pages welcome

## 2019-03-07 NOTE — Progress Notes (Signed)
RT offered pt CPAP for the night pt declined stating she is chlosterphobic and unable to tolerate. RT will continue to monitor.

## 2019-03-07 NOTE — Progress Notes (Signed)
Had 2 runs of loose stool brown in color since this am, senokot sched med not given. MD aware and claimed to monitor the pt. Senokot changed to prn.

## 2019-03-08 ENCOUNTER — Encounter (HOSPITAL_COMMUNITY)
Admission: EM | Disposition: A | Discharge status: Home Health | Payer: Self-pay | Source: Home / Self Care | Attending: Critical Care Medicine

## 2019-03-08 ENCOUNTER — Encounter (HOSPITAL_COMMUNITY): Payer: Self-pay | Admitting: Certified Registered Nurse Anesthetist

## 2019-03-08 ENCOUNTER — Inpatient Hospital Stay (HOSPITAL_COMMUNITY): Payer: Medicare Other | Admitting: Certified Registered Nurse Anesthetist

## 2019-03-08 ENCOUNTER — Inpatient Hospital Stay (HOSPITAL_COMMUNITY): Payer: Medicare Other

## 2019-03-08 HISTORY — PX: CYSTOSCOPY W/ URETERAL STENT PLACEMENT: SHX1429

## 2019-03-08 LAB — CBC
HCT: 30.2 % — ABNORMAL LOW (ref 36.0–46.0)
Hemoglobin: 9.1 g/dL — ABNORMAL LOW (ref 12.0–15.0)
MCH: 26.8 pg (ref 26.0–34.0)
MCHC: 30.1 g/dL (ref 30.0–36.0)
MCV: 89.1 fL (ref 80.0–100.0)
Platelets: 410 10*3/uL — ABNORMAL HIGH (ref 150–400)
RBC: 3.39 MIL/uL — ABNORMAL LOW (ref 3.87–5.11)
RDW: 17.9 % — ABNORMAL HIGH (ref 11.5–15.5)
WBC: 17.5 10*3/uL — ABNORMAL HIGH (ref 4.0–10.5)
nRBC: 0 % (ref 0.0–0.2)

## 2019-03-08 LAB — BASIC METABOLIC PANEL
Anion gap: 8 (ref 5–15)
BUN: 14 mg/dL (ref 8–23)
CO2: 29 mmol/L (ref 22–32)
Calcium: 8.9 mg/dL (ref 8.9–10.3)
Chloride: 103 mmol/L (ref 98–111)
Creatinine, Ser: 0.84 mg/dL (ref 0.44–1.00)
GFR calc Af Amer: >60 mL/min (ref >60)
GFR calc non Af Amer: >60 mL/min (ref >60)
Glucose, Bld: 118 mg/dL — ABNORMAL HIGH (ref 70–99)
Potassium: 3.6 mmol/L (ref 3.5–5.1)
Sodium: 140 mmol/L (ref 135–145)

## 2019-03-08 LAB — HEPARIN LEVEL (UNFRACTIONATED): Heparin Unfractionated: 0.45 IU/mL (ref 0.30–0.70)

## 2019-03-08 SURGERY — CYSTOSCOPY, WITH RETROGRADE PYELOGRAM AND URETERAL STENT INSERTION
Anesthesia: General | Site: Ureter | Laterality: Left

## 2019-03-08 MED ORDER — PROPOFOL 10 MG/ML IV BOLUS
INTRAVENOUS | Status: DC | PRN
  Administered 2019-03-08: 100 mg via INTRAVENOUS
  Administered 2019-03-08: 50 mg via INTRAVENOUS

## 2019-03-08 MED ORDER — LIDOCAINE HCL URETHRAL/MUCOSAL 2 % EX GEL
CUTANEOUS | Status: AC
  Filled 2019-03-08: qty 20

## 2019-03-08 MED ORDER — LACTATED RINGERS IV SOLN
INTRAVENOUS | Status: DC
  Administered 2019-03-08: 12:00:00 via INTRAVENOUS

## 2019-03-08 MED ORDER — FENTANYL CITRATE (PF) 250 MCG/5ML IJ SOLN
INTRAMUSCULAR | Status: AC
  Filled 2019-03-08: qty 5

## 2019-03-08 MED ORDER — SUCCINYLCHOLINE CHLORIDE 20 MG/ML IJ SOLN
INTRAMUSCULAR | Status: DC | PRN
  Administered 2019-03-08: 120 mg via INTRAVENOUS

## 2019-03-08 MED ORDER — APIXABAN 2.5 MG PO TABS
2.5000 mg | ORAL_TABLET | Freq: Two times a day (BID) | ORAL | Status: DC
  Administered 2019-03-08 – 2019-03-09 (×3): 2.5 mg via ORAL
  Filled 2019-03-08 (×3): qty 1

## 2019-03-08 MED ORDER — DEXAMETHASONE SODIUM PHOSPHATE 10 MG/ML IJ SOLN
INTRAMUSCULAR | Status: DC | PRN
  Administered 2019-03-08: 5 mg via INTRAVENOUS

## 2019-03-08 MED ORDER — ROCURONIUM BROMIDE 10 MG/ML (PF) SYRINGE
PREFILLED_SYRINGE | INTRAVENOUS | Status: DC | PRN
  Administered 2019-03-08: 20 mg via INTRAVENOUS

## 2019-03-08 MED ORDER — FENTANYL CITRATE (PF) 250 MCG/5ML IJ SOLN
INTRAMUSCULAR | Status: DC | PRN
  Administered 2019-03-08: 50 ug via INTRAVENOUS

## 2019-03-08 MED ORDER — SODIUM CHLORIDE 0.9 % IR SOLN
Status: DC | PRN
  Administered 2019-03-08: 3000 mL via INTRAVESICAL

## 2019-03-08 MED ORDER — PHENYLEPHRINE 40 MCG/ML (10ML) SYRINGE FOR IV PUSH (FOR BLOOD PRESSURE SUPPORT)
PREFILLED_SYRINGE | INTRAVENOUS | Status: DC | PRN
  Administered 2019-03-08 (×2): 80 ug via INTRAVENOUS

## 2019-03-08 MED ORDER — ONDANSETRON HCL 4 MG/2ML IJ SOLN
INTRAMUSCULAR | Status: DC | PRN
  Administered 2019-03-08: 4 mg via INTRAVENOUS

## 2019-03-08 MED ORDER — PROPOFOL 10 MG/ML IV BOLUS
INTRAVENOUS | Status: AC
  Filled 2019-03-08: qty 20

## 2019-03-08 MED ORDER — CEPHALEXIN 500 MG PO CAPS
500.0000 mg | ORAL_CAPSULE | Freq: Four times a day (QID) | ORAL | Status: DC
  Administered 2019-03-08 – 2019-03-09 (×3): 500 mg via ORAL
  Filled 2019-03-08 (×3): qty 1

## 2019-03-08 MED ORDER — EPHEDRINE SULFATE-NACL 50-0.9 MG/10ML-% IV SOSY
PREFILLED_SYRINGE | INTRAVENOUS | Status: DC | PRN
  Administered 2019-03-08 (×2): 10 mg via INTRAVENOUS

## 2019-03-08 MED ORDER — SODIUM CHLORIDE 0.9 % IV SOLN
INTRAVENOUS | Status: DC | PRN
  Administered 2019-03-08: 45 ug/min via INTRAVENOUS

## 2019-03-08 MED ORDER — SUGAMMADEX SODIUM 200 MG/2ML IV SOLN
INTRAVENOUS | Status: DC | PRN
  Administered 2019-03-08: 100 mg via INTRAVENOUS

## 2019-03-08 MED ORDER — IOHEXOL 300 MG/ML  SOLN
INTRAMUSCULAR | Status: DC | PRN
  Administered 2019-03-08: 50 mL via URETHRAL

## 2019-03-08 SURGICAL SUPPLY — 26 items
BAG DRAIN URO-CYSTO SKYTR STRL (DRAIN) ×2 IMPLANT
CATH INTERMIT  6FR 70CM (CATHETERS) IMPLANT
CATH URET 5FR 28IN OPEN ENDED (CATHETERS) ×1 IMPLANT
COVER WAND RF STERILE (DRAPES) ×2 IMPLANT
ELECT REM PT RETURN 9FT ADLT (ELECTROSURGICAL)
ELECTRODE REM PT RTRN 9FT ADLT (ELECTROSURGICAL) IMPLANT
FIBER LASER TRAC TIP (UROLOGICAL SUPPLIES) IMPLANT
GLOVE BIO SURGEON STRL SZ7.5 (GLOVE) ×2 IMPLANT
GOWN STRL REUS W/ TWL LRG LVL3 (GOWN DISPOSABLE) ×1 IMPLANT
GOWN STRL REUS W/TWL LRG LVL3 (GOWN DISPOSABLE) ×1
GUIDEWIRE ANG ZIPWIRE 038X150 (WIRE) ×1 IMPLANT
GUIDEWIRE STR DUAL SENSOR (WIRE) ×2 IMPLANT
INFUSOR MANOMETER BAG 3000ML (MISCELLANEOUS) ×2 IMPLANT
IV NS 1000ML (IV SOLUTION) ×1
IV NS 1000ML BAXH (IV SOLUTION) ×1 IMPLANT
IV NS IRRIG 3000ML ARTHROMATIC (IV SOLUTION) ×2 IMPLANT
KIT TURNOVER KIT B (KITS) ×1 IMPLANT
MANIFOLD NEPTUNE II (INSTRUMENTS) ×2 IMPLANT
NS IRRIG 1000ML POUR BTL (IV SOLUTION) ×3 IMPLANT
PACK CYSTO (CUSTOM PROCEDURE TRAY) ×2 IMPLANT
SET IRRIG Y TYPE TUR BLADDER L (SET/KITS/TRAYS/PACK) IMPLANT
SOL PREP POV-IOD 4OZ 10% (MISCELLANEOUS) ×2 IMPLANT
STENT URET 6FRX24 CONTOUR (STENTS) ×1 IMPLANT
SYR 10ML LL (SYRINGE) ×2 IMPLANT
TUBE CONNECTING 12X1/4 (SUCTIONS) ×1 IMPLANT
TUBE FEEDING 8FR 16IN STR KANG (MISCELLANEOUS) IMPLANT
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
## 2019-03-08 NOTE — Interval H&P Note (Signed)
History and Physical Interval Note:  03/08/2019 11:36 AM  Martha Mora  has presented today for surgery, with the diagnosis of Left UPJ stent.  The various methods of treatment have been discussed with the patient and family. After consideration of risks, benefits and other options for treatment, the patient has consented to  Procedure(s): CYSTOSCOPY WITH LEFT RETROGRADE PYELOGRAM/ LEFT URETERAL STENT PLACEMENT (Left) as a surgical intervention.  The patient's history has been reviewed, patient examined, no change in status, stable for surgery.  I have reviewed the patient's chart and labs.  Questions were answered to the patient's satisfaction.     Genine Beckett A Quentavious Rittenhouse
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
## 2019-03-08 NOTE — Progress Notes (Addendum)
Family Medicine Teaching Service Daily Progress Note Intern Pager: [phone]  Patient name: Martha Mora Medical record number: [id number] Date of birth: Apr 04, 1946 Age: 74 y.o. Gender: female  Primary Care Provider: [name], DO Consultants: CCM Code Status: Full   Pt Overview and Major Events to Date:  Admitted to [facility] on 4/5 Transfer of care to CCM of 4/6 and intubated Extubated on 4/15 New onset afib 4/15 CT showing left UPJ stone 4/16  Assessment and Plan: Martha Mora is a 73 y.o. female admitted for fever and SOB and diagnosed with urosepsis. Also found to have LLL haziness on CXR.  Her chronic conditions include COPD, anxiety, MVR, HTN, GERD, incontinence, HLD, obesity, restless leg syndrome.   E coli bacteremia 2/2 UTI, Obstructing Left Renal Stone S/p rocephin 4/6-4/11, vanc 4/12-4/16, zosyn 4/12-4/16. Now currently on rocephin (4/16 - ). Urology planning for stent placement today at 11:30am.  Wbc 18.7>17.5.  Remains afebrile.   -continue rocephin (4/16 - ), plan to de-escalate after procedure, f/u urology recs -Urology following, appreciate recs -planning for ureteral stent today -NPO at 12am for procedure today -will opt to not repeat urine culture given improvement and pending procedure -holding eliquis, on hep gtt pending procedure - per urology can d/c likely 1-2 days after surgery and patient will have ureterosocpy as outpatient, she is able to be on Eliquis for this procedure   Acute hypoxemic respiratory failure  Resolved. In setting of septic shock. Patient intubated on 4/6 with successful extubation on 4/15. COVID negative. No h/o home O2 use. Continues to be on 3L per [place].  Will continues to wean as able as resp status continues to improve. -continue PRN albuterol  -continue PRN O2, goal of O2 sat 88-95%, wean as tolerated  Afib  New onset. Patient also with elevated troponins during ICU admission, 2/2 demand ischemia. CHADSVASC 6. H/o CAD s/p RCA stent.   HR 74, remains in sinus rhythm. -continuous cardiac monitoring  - ASA at home for hx DES, holding ASA for poss procedure, will need to determine if needed on d/c as she will be d/c'ed on Eliquis - holding eliquis for possible procedure - on hep gtt - transitioned to amiodarone PO 200mg  BID 4/18, will continue x 1 week and then decrease to QD, per cards recs  Acute diastolic CHF No prior history CHF.  EF 4/10 showed EF 55%. D/C'ed Lasix 40 mg BID IV yesterday and was started on Lasix 40mg  PO QD per cards recs.  UOP with many unmeasured urine occurrences.  Weight down 1.1 kg from yesterday.  Volume status on exam appears euvolemic. -cont Lasix 40mg  PO QD, per cards -Strict I/Os, daily weights, telemetry  -Daily monitoring renal function -cardiology consulted, appreciate recommendations   Diarrhea Improved with discontinuation of Senna. Had formed BM today. - cont to monitor - if continues to have diarrhea despite d/c'ing senna, would consider C diff testing - cont senna qhs prn  AKI: Resolved Basline ~ 0.8, this AM 0.84, remains at baseline. -monitor BMP  Hypokalemia: Resolved K 3.1>3.6.  Will continue to monitor now that patient is on daily Lasix PO. -follow on daily BMP  COPD Albuterol PRN at home. Smoking history with recent vaping use.  -continue albuterol PRN -encourage smoking cessation   HTN Home meds: amlodipine 5mg  daily, coreg 3.125 mg bid, losartan 100 mg nightly. BP 167/65.. -continue losartan 50 mg -ask cardiology tomorrow, 4/21. Regarding BP meds  HLD Home meds: rosuvastatin 20mg   -continue home meds  CAD  H/o MI with stent placement in mid and distal [place] with DES x2 in 2017. Followed by Dr. [name] (outpatiet cardiology). Placed on aspirin 325 by Dr. [name] during visit in September 2019.  Was on DAPT x 1 year. ASCVD risk or is 21.3%. -holding ASA in setting of eliquis, holding eliquis in setting of poss procedure, on hep gtt -continue statin  -cardiology  consulted, appreciate recommendations  - will speak with cards prior to d/c regarding ASA use on discharge  Anemia Likely 2/2 acute illness.  Hgb stable at 9.1. -follow on daily CBC  Restless leg syndrome  Home meds: Requip 3mg  qhs -continue requip   Anxiety/Insomnia Home meds: celexa 40mg  daily, trazodone 50mg  qhs PRN.  Added melatonin last PM, patient notes that she did have some improvement in her last PM with melatonin. - continue home meds  - melatonin 3mg  QHS prn  FEN/GI: regular diet, senna qhs prn PPx: hep gtt  Disposition: continued inpatient, [facility] with [facility] pending d/c, ureteral stent placement today  Subjective:  Patient continues to state that she is feeling well this morning.  She denies any complaints.  States that she is "okay" last night.  Objective: Temp:  [98.1 F (36.7 C)-99.5 F (37.5 C)] 99.1 F (37.3 C) (04/20 0748) Pulse Rate:  [65-75] 70 (04/20 0748) Resp:  [22-30] 22 (04/20 0748) BP: (127-168)/(56-85) 167/65 (04/20 0748) SpO2:  [93 %-100 %] 95 % (04/20 0748) Weight:  [95.5 kg] 95.5 kg (04/20 0550)  Physical Exam: General: 73 y.o. female in NAD, sitting up at bedside commode, requests to go ahead and be examined Cardio: RRR no m/r/g Lungs: CTAB, no wheezing, no rhonchi, no crackles, on 3L per [place] Abdomen: Soft, non-tender to palpation, positive bowel sounds Skin: warm and dry Extremities: No edema   Laboratory: Recent Labs  Lab 03/06/19 0545 03/07/19 0530 03/08/19 0426  WBC 18.6* 18.7* 17.5*  HGB 8.8* 9.1* 9.1*  HCT 31.2* 32.0* 30.2*  PLT 397 398 410*   Recent Labs  Lab 03/06/19 0545 03/07/19 0530 03/08/19 0426  NA 143 141 140  K 3.3* 3.1* 3.6  CL 99 100 103  CO2 33* 32 29  BUN 18 14 14   CREATININE 0.87 0.86 0.84  CALCIUM 8.9 8.9 8.9  GLUCOSE 151* 132* 118*     Imaging/Diagnostic Tests: No results found.  [place], DO 03/08/2019, 7:51 AM PGY-1, [place] Intern pager: [phone], text  pages welcome

## 2019-03-08 NOTE — Progress Notes (Signed)
Physical Therapy Treatment Patient Details Name: Martha Mora MRN: [id number] DOB: 10/04/1946 Today's Date: 03/08/2019    History of Present Illness Pt adm with SOB, fever, and foul smelling urine. Pt found to have E coli UTI with septic shock and PNA. COVID 19 Ruled out. Pt intubated 4/6 and extubated 4/15. Pt also developed afib with RVR. PMH - copd, breast CA, CAD, MI, OA    PT Comments    Pt with improved tolerance for ambulation this session, walking a total of 40 ft with one seated rest break due to fatigue and tachypnea. Pt's RR up to 37 breaths per minute with exertion, other VSS. Pt complaining of fatigue and back pain this session, back pain somewhat improved with mobility. PT to continue to follow acutely and will progress mobility as tolerated by pt.    Follow Up Recommendations  Home health PT;Supervision/Assistance - 24 hour     Equipment Recommendations  None recommended by PT    Recommendations for Other Services       Precautions / Restrictions Precautions Precautions: Fall Precaution Comments: watch O2 Restrictions Weight Bearing Restrictions: No    Mobility  Bed Mobility Overal bed mobility: Needs Assistance Bed Mobility: Supine to Sit;Sit to Supine     Supine to sit: Min assist Sit to supine: Min assist   General bed mobility comments: min assist for trunk elevation and LE management to and from EOB. Verbal cuing for hand placement, use of bedrails to assist in coming to EOB. PT/OT slid pt up in bed with use of bed pad.  Transfers Overall transfer level: Needs assistance Equipment used: Rolling walker (2 wheeled) Transfers: Sit to/from Stand Sit to Stand: Min assist;+2 physical assistance;+2 safety/equipment;Min guard         General transfer comment: initially min A +2, for power up and steadying. Pt progressing to min guard for safety. Sit to stand x2, once from bed and once from recliner after seated rest break during  ambulation.  Ambulation/Gait Ambulation/Gait assistance: Min assist;+2 safety/equipment;+2 physical assistance Gait Distance (Feet): 15 Feet(followed by seated rest break, then 25 ft ) Assistive device: Rolling walker (2 wheeled) Gait Pattern/deviations: Step-through pattern;Decreased stride length;Trunk flexed Gait velocity: decr    General Gait Details: External rotation stance and forward flexed trunk posture as last PT session, Verbal cuing for placement inside RW multiple times for safety. After 15 ft ambulation, pt required seated rest break due to tachypnea and pt-reported fatigue. Pt lacking eccentric control when lowering to recliner. RR up to 37 during ambulation, HR up to 90s with exertion. SpO2 on 3L 88-90s during ambulation.   Stairs             Wheelchair Mobility    Modified Rankin (Stroke Patients Only)       Balance Overall balance assessment: Needs assistance Sitting-balance support: No upper extremity supported;Feet supported Sitting balance-Leahy Scale: Fair     Standing balance support: Bilateral upper extremity supported Standing balance-Leahy Scale: Poor Standing balance comment: reliant on RW for steadying.                             Cognition Arousal/Alertness: Awake/alert Behavior During Therapy: WFL for tasks assessed/performed Overall Cognitive Status: Impaired/Different from baseline Area of Impairment: Problem solving;Safety/judgement                         Safety/Judgement: Decreased awareness of safety   Problem Solving:  Requires verbal cues General Comments: Pt with better direction following today, set up expectations and Pt is less impulsive.      Exercises      General Comments General comments (skin integrity, edema, etc.): spoke with RN staff about washing and brushing hair - will plan on bringing detangler and brush from OT supplies      Pertinent Vitals/Pain Pain Assessment: Faces Faces Pain  Scale: Hurts little more Pain Location: lower back Pain Descriptors / Indicators: Aching;Discomfort Pain Intervention(s): Limited activity within patient's tolerance;Monitored during session;Repositioned    Home Living                      Prior Function            PT Goals (current goals can now be found in the care plan section) Acute Rehab PT Goals Patient Stated Goal: go home PT Goal Formulation: With patient/family Time For Goal Achievement: 03/18/19 Potential to Achieve Goals: Good Progress towards PT goals: Progressing toward goals    Frequency    Min 3X/week      PT Plan Current plan remains appropriate    Co-evaluation PT/OT/SLP Co-Evaluation/Treatment: Yes Reason for Co-Treatment: To address functional/ADL transfers;For patient/therapist safety PT goals addressed during session: Mobility/safety with mobility;Proper use of DME OT goals addressed during session: ADL's and self-care;Proper use of Adaptive equipment and DME      AM-PAC PT "6 Clicks" Mobility   Outcome Measure  Help needed turning from your back to your side while in a flat bed without using bedrails?: None Help needed moving from lying on your back to sitting on the side of a flat bed without using bedrails?: A Little Help needed moving to and from a bed to a chair (including a wheelchair)?: A Little Help needed standing up from a chair using your arms (e.g., wheelchair or bedside chair)?: A Little Help needed to walk in hospital room?: A Little Help needed climbing 3-5 steps with a railing? : A Lot 6 Click Score: 18    End of Session Equipment Utilized During Treatment: Oxygen;Gait belt(3LO2 via [place]) Activity Tolerance: Patient limited by fatigue Patient left: in bed;with bed alarm set;with call bell/phone within reach Nurse Communication: Mobility status PT Visit Diagnosis: Unsteadiness on feet (R26.81);Other abnormalities of gait and mobility (R26.89);Muscle weakness (generalized)  (M62.81)     Time: 8828-0034 PT Time Calculation (min) (ACUTE ONLY): 29 min  Charges:  $Gait Training: 8-22 mins                     Julien Girt, PT Acute Rehabilitation Services Pager [phone]  Office [phone]    Lorelai Huyser D Tally Mckinnon 03/08/2019, 11:06 AM

## 2019-03-08 NOTE — Progress Notes (Signed)
Occupational Therapy Treatment Patient Details Name: Martha Mora MRN: [id number] DOB: 1946/06/06 Today's Date: 03/08/2019    History of present illness Pt adm with SOB, fever, and foul smelling urine. Pt found to have E coli UTI with septic shock and PNA. COVID 19 Ruled out. Pt intubated 4/6 and extubated 4/15. Pt also developed afib with RVR. PMH - copd, breast CA, CAD, MI, OA   OT comments  Pt progressing towards OT goals this session. She was able to sit EOB for grooming tasks with no LOB, does require breaks. Pt then able to perform transfers initially at min A +2 and progress to min guard with RW. Pt with decreased safety awareness and continues to require assistance for ADL and transfers. Spoke with RN staff about washing and brushing hair it is beginning to matt. OT will bring detangler spray and brush. OT will continue to follow acutely- Pt to go to OR later today.    Follow Up Recommendations  Home health OT;Supervision/Assistance - 24 hour    Equipment Recommendations  3 in 1 bedside commode    Recommendations for Other Services      Precautions / Restrictions Precautions Precautions: Fall Precaution Comments: watch O2 Restrictions Weight Bearing Restrictions: No       Mobility Bed Mobility Overal bed mobility: Needs Assistance Bed Mobility: Supine to Sit;Sit to Supine     Supine to sit: Min assist Sit to supine: Min assist   General bed mobility comments: vc for sequencing, assist for trunk elevation and BLE back into bed  Transfers Overall transfer level: Needs assistance Equipment used: Rolling walker (2 wheeled) Transfers: Sit to/from Stand Sit to Stand: Min assist;+2 physical assistance;+2 safety/equipment;Min guard         General transfer comment: initially min A +2, progressed to min guard    Balance Overall balance assessment: Needs assistance Sitting-balance support: No upper extremity supported;Feet supported Sitting balance-Leahy Scale:  Fair     Standing balance support: Bilateral upper extremity supported Standing balance-Leahy Scale: Poor Standing balance comment: dependent on BUE in standing position                           ADL either performed or assessed with clinical judgement   ADL Overall ADL's : Needs assistance/impaired     Grooming: Set up;Sitting Grooming Details (indicate cue type and reason): unable to maintain standing due to fatigue             Lower Body Dressing: Maximal assistance;Sitting/lateral leans;Sit to/from stand Lower Body Dressing Details (indicate cue type and reason): to don socks Toilet Transfer: Minimal assistance           Functional mobility during ADLs: Minimal assistance;+2 for safety/equipment;Rolling walker;Cueing for safety General ADL Comments: fatigues quickly     Vision   Vision Assessment?: No apparent visual deficits   Perception     Praxis      Cognition Arousal/Alertness: Awake/alert Behavior During Therapy: WFL for tasks assessed/performed Overall Cognitive Status: Impaired/Different from baseline Area of Impairment: Problem solving;Safety/judgement                         Safety/Judgement: Decreased awareness of safety   Problem Solving: Requires verbal cues General Comments: Pt with better direction following today, set up expectations and Pt is less impulsive        Exercises     Shoulder Instructions       General  Comments spoke with RN staff about washing and brushing hair - will plan on bringing detangler and brush from OT supplies    Pertinent Vitals/ Pain       Pain Assessment: Faces Faces Pain Scale: Hurts little more Pain Location: lower back Pain Descriptors / Indicators: Aching;Discomfort Pain Intervention(s): Limited activity within patient's tolerance;Monitored during session;Repositioned  Home Living                                          Prior Functioning/Environment               Frequency  Min 3X/week        Progress Toward Goals  OT Goals(current goals can now be found in the care plan section)  Progress towards OT goals: Progressing toward goals  Acute Rehab OT Goals Patient Stated Goal: go home OT Goal Formulation: With patient Time For Goal Achievement: 03/18/19 Potential to Achieve Goals: Good  Plan Discharge plan remains appropriate;Frequency remains appropriate    Co-evaluation                 AM-PAC OT "6 Clicks" Daily Activity     Outcome Measure   Help from another person eating meals?: A Little Help from another person taking care of personal grooming?: A Little Help from another person toileting, which includes using toliet, bedpan, or urinal?: A Little Help from another person bathing (including washing, rinsing, drying)?: A Lot Help from another person to put on and taking off regular upper body clothing?: A Little Help from another person to put on and taking off regular lower body clothing?: A Lot 6 Click Score: 16    End of Session Equipment Utilized During Treatment: Gait belt;Rolling walker;Oxygen(3L)  OT Visit Diagnosis: Muscle weakness (generalized) (M62.81)   Activity Tolerance Patient tolerated treatment well   Patient Left in bed;with call bell/phone within reach   Nurse Communication Mobility status        Time: 1062-6948 OT Time Calculation (min): 29 min  Charges: OT General Charges $OT Visit: 1 Visit OT Treatments $Self Care/Home Management : 8-22 mins  Hulda Humphrey OTR/L Acute Rehabilitation Services Pager: [phone] Office: [place] 03/08/2019, 10:21 AM

## 2019-03-08 NOTE — Progress Notes (Addendum)
ANTICOAGULATION CONSULT NOTE - Follow up [place] for heparin Indication: atrial fibrillation  Allergies  Allergen Reactions  . Keflex [Cephalexin] Other (See Comments)    "Made me feel funny"  . Scallops [Shellfish Allergy] Nausea And Vomiting  . Penicillins Rash and Other (See Comments)    03/01/19 tolerated Zosyn Red bumps all over stomach Has patient had a PCN reaction causing immediate rash, facial/tongue/throat swelling, SOB or lightheadedness with hypotension: Yes Has patient had a PCN reaction causing severe rash involving mucus membranes or skin necrosis: No Has patient had a PCN reaction that required hospitalization: No Has patient had a PCN reaction occurring within the last 10 years: No If all of the above answers are "NO", then may proceed with Cephalosporin use.    Patient Measurements: Height: 4\' 11"  (149.9 cm) Weight: 210 lb 8.6 oz (95.5 kg) IBW/kg (Calculated) : 43.2 Heparin Dosing Weight: 66.5 kg  Vital Signs: Temp: 99.1 F (37.3 C) (04/20 0748) Temp Source: Oral (04/20 0748) BP: 167/65 (04/20 0748) Pulse Rate: 70 (04/20 0748)  Labs: Recent Labs    03/06/19 0545 03/07/19 0530 03/08/19 0426 03/08/19 0427  HGB 8.8* 9.1* 9.1*  --   HCT 31.2* 32.0* 30.2*  --   PLT 397 398 410*  --   APTT 70* 76*  --   --   HEPARINUNFRC 1.01* 0.70  --  0.45  CREATININE 0.87 0.86 0.84  --     Estimated Creatinine Clearance: 58.6 mL/min (by C-G formula based on SCr of 0.84 mg/dL).  Assessment: 73 YOF to restart heparin for new afib. She was transitioned to apixaban several days ago, last dose 4/17 AM. Urology stent placement planned for 4/20 so new orders received to change back to heparin. Will restart previous rate that she was at goal.   Heparin level therapeutic at 0.45 on heparin 1900 units/hr. CBC stable. No signs/symptoms of bleeding or infusion issues noted per RN. Per note placed by RN, hep gtt to be stopped at 0930. Will follow-up  anticoagulation plan post-procedure.    Goal of Therapy:  Heparin level 0.3-0.7 units/ml Monitor platelets by anticoagulation protocol: Yes   Plan:  Continue heparin infusion at 1900 units/hr Daily HL and CBC while on heparin Monitor for bleeding complications Follow-up anticoagulation plan post-stent placement  Thanks for allowing pharmacy to be a part of this patient's care.   Jackson Latino, PharmD PGY1 Pharmacy Resident Phone [phone] 03/08/2019     8:18 AM

## 2019-03-08 NOTE — Anesthesia Preprocedure Evaluation (Addendum)
Anesthesia Evaluation  Patient identified by MRN, date of birth, ID band Patient awake    Reviewed: Allergy & Precautions, NPO status , Patient's Chart, lab work & pertinent test results, reviewed documented beta blocker date and time   History of Anesthesia Complications Negative for: history of anesthetic complications  Airway Mallampati: II  TM Distance: >3 FB Neck ROM: Full    Dental  (+) Dental Advisory Given   Pulmonary sleep apnea (does not wear her CPAP) , COPD,  COPD inhaler, Current Smoker,  Recently intubated with COPD/urosepsis Tested NEG COVID 19   breath sounds clear to auscultation       Cardiovascular hypertension, Pt. on medications and Pt. on home beta blockers (-) angina+ CAD and + Cardiac Stents  + dysrhythmias Atrial Fibrillation  Rhythm:Regular Rate:Normal  02/26/2019 ECHO: The left ventricle has normal systolic function, with an ejection fraction of 55-60%. The cavity size was normal. Left ventricular diastolic parameters were normal, Inferior basal akinesis, mild MR, mild TR   Neuro/Psych Anxiety Depression    GI/Hepatic Neg liver ROS, GERD  Medicated and Poorly Controlled,  Endo/Other  Morbid obesity  Renal/GU negative Renal ROS     Musculoskeletal  (+) Arthritis ,   Abdominal (+) + obese,   Peds  Hematology Hb 9.1 Eliquis   Anesthesia Other Findings H/o breast cancer  Reproductive/Obstetrics                            Anesthesia Physical Anesthesia Plan  ASA: III  Anesthesia Plan: General   Post-op Pain Management:    Induction: Intravenous  PONV Risk Score and Plan: 2 and Ondansetron and Dexamethasone  Airway Management Planned: Oral ETT  Additional Equipment:   Intra-op Plan:   Post-operative Plan: Extubation in OR  Informed Consent: I have reviewed the patients History and Physical, chart, labs and discussed the procedure including the risks,  benefits and alternatives for the proposed anesthesia with the patient or authorized representative who has indicated his/her understanding and acceptance.     Dental advisory given  Plan Discussed with: CRNA and Surgeon  Anesthesia Plan Comments: (Plan routine monitors, GETA)        Anesthesia Quick Evaluation

## 2019-03-08 NOTE — Anesthesia Postprocedure Evaluation (Signed)
Anesthesia Post Note  Patient: Martha Mora  Procedure(s) Performed: CYSTOSCOPY WITH LEFT RETROGRADE PYELOGRAM/ LEFT URETERAL STENT PLACEMENT (Left Ureter)     Patient location during evaluation: PACU Anesthesia Type: General Level of consciousness: awake and alert, patient cooperative and oriented Pain management: pain level controlled Vital Signs Assessment: post-procedure vital signs reviewed and stable Respiratory status: spontaneous breathing, nonlabored ventilation, respiratory function stable and patient connected to nasal cannula oxygen Cardiovascular status: blood pressure returned to baseline and stable Postop Assessment: no apparent nausea or vomiting Anesthetic complications: no    Last Vitals:  Vitals:   03/08/19 1332 03/08/19 1356  BP: (!) 129/46 (!) 158/69  Pulse: 74 72  Resp: 20 (!) 31  Temp: 36.7 C 37.2 C  SpO2: 94% 98%    Last Pain:  Vitals:   03/08/19 1356  TempSrc: Oral  PainSc: 0-No pain                 Arley Salamone,E. [place]

## 2019-03-08 NOTE — Progress Notes (Signed)
Patient declined to use CPAP at this time, no distress noted.

## 2019-03-08 NOTE — Anesthesia Procedure Notes (Signed)
Procedure Name: Intubation Date/Time: 03/08/2019 12:18 PM Performed by: Julieta Bellini, CRNA Pre-anesthesia Checklist: Patient identified, Emergency Drugs available, Suction available and Patient being monitored Patient Re-evaluated:Patient Re-evaluated prior to induction Oxygen Delivery Method: Circle system utilized Preoxygenation: Pre-oxygenation with 100% oxygen Induction Type: IV induction and Rapid sequence Ventilation: Mask ventilation without difficulty Laryngoscope Size: 3 and Mac Grade View: Grade I Tube type: Oral Tube size: 7.0 mm Number of attempts: 1 Airway Equipment and Method: Stylet and Oral airway Placement Confirmation: ETT inserted through vocal cords under direct vision,  positive ETCO2 and breath sounds checked- equal and bilateral Secured at: 21 cm Tube secured with: Tape Dental Injury: Teeth and Oropharynx as per pre-operative assessment

## 2019-03-08 NOTE — Progress Notes (Signed)
Heparin gtt is still infusing and notified Urologist Dr. Matilde Sprang regarding when stop heparin. Dr. Matilde Sprang is okay to infuse heparin, but get advise from cardiology. Notified Dr. [name] regarding this matter. Stop heparin 9:30 am. HS Truman Hayward RN

## 2019-03-08 NOTE — Progress Notes (Signed)
Clinically ready for stent 1130 am Will proceed  Spoke with patient

## 2019-03-08 NOTE — Transfer of Care (Signed)
Immediate Anesthesia Transfer of Care Note  Patient: Martha Mora  Procedure(s) Performed: CYSTOSCOPY WITH LEFT RETROGRADE PYELOGRAM/ LEFT URETERAL STENT PLACEMENT (Left Ureter)  Patient Location: PACU  Anesthesia Type:General  Level of Consciousness: awake, alert , oriented and patient cooperative  Airway & Oxygen Therapy: Patient Spontanous Breathing and Patient connected to face mask oxygen  Post-op Assessment: Report given to RN, Post -op Vital signs reviewed and stable and Patient moving all extremities X 4  Post vital signs: Reviewed and stable  Last Vitals:  Vitals Value Taken Time  BP 124/48 03/08/2019  1:02 PM  Temp    Pulse 78 03/08/2019  1:04 PM  Resp 21 03/08/2019  1:04 PM  SpO2 94 % 03/08/2019  1:04 PM  Vitals shown include unvalidated device data.  Last Pain:  Vitals:   03/08/19 0748  TempSrc: Oral  PainSc: 0-No pain      Patients Stated Pain Goal: 1 (07/46/00 2984)  Complications: No apparent anesthesia complications

## 2019-03-08 NOTE — Op Note (Signed)
Preoperative diagnosis: Left ureteral stone and urosepsis Postoperative diagnosis, left ureteral stone and urosepsis Surgery: Cystoscopy left retrograde ureterogram and insertion of left ureteral stent Surgeon: Dr. [name]  Patient has the above diagnosis and consented the above procedure.  She was stable and was on preoperative antibiotics prior.  21 French cystoscope was utilized.  Bladder mucosa and trigone were normal.  There is no evidence of cystitis.  No trigonal edema  Under fluoroscopic and cystoscopic guidance I passed a sensor wire to the mid left ureter.  I then passed a 6 [place] open-ended ureteral catheter removing the sensor wire.  Retrograde ureterogram: I did a gentle retrograde ureterogram with 4 cc of contrast outlining the ureter and mildly dilated calyces.  I then passed a wire to the level of the upper pole calyx and remove the open-ended ureteral catheter.  Over the wire I passed under fluoroscopic and cystoscopic guidance a 24 cm x 6 French double-J without the string.  I went in very well.  Stone may have been pushed back to the lower pole calyx.  Bladder was emptied.  Procedure went very well

## 2019-03-09 ENCOUNTER — Other Ambulatory Visit: Payer: Self-pay

## 2019-03-09 ENCOUNTER — Encounter (HOSPITAL_COMMUNITY): Payer: Self-pay | Admitting: Urology

## 2019-03-09 LAB — BASIC METABOLIC PANEL
Anion gap: 12 (ref 5–15)
BUN: 14 mg/dL (ref 8–23)
CO2: 25 mmol/L (ref 22–32)
Calcium: 9.1 mg/dL (ref 8.9–10.3)
Chloride: 103 mmol/L (ref 98–111)
Creatinine, Ser: 0.96 mg/dL (ref 0.44–1.00)
GFR calc Af Amer: >60 mL/min (ref >60)
GFR calc non Af Amer: 58 mL/min — ABNORMAL LOW (ref >60)
Glucose, Bld: 143 mg/dL — ABNORMAL HIGH (ref 70–99)
Potassium: 3.7 mmol/L (ref 3.5–5.1)
Sodium: 140 mmol/L (ref 135–145)

## 2019-03-09 LAB — CBC
HCT: 29.8 % — ABNORMAL LOW (ref 36.0–46.0)
Hemoglobin: 8.9 g/dL — ABNORMAL LOW (ref 12.0–15.0)
MCH: 26.8 pg (ref 26.0–34.0)
MCHC: 29.9 g/dL — ABNORMAL LOW (ref 30.0–36.0)
MCV: 89.8 fL (ref 80.0–100.0)
Platelets: 394 10*3/uL (ref 150–400)
RBC: 3.32 MIL/uL — ABNORMAL LOW (ref 3.87–5.11)
RDW: 17.8 % — ABNORMAL HIGH (ref 11.5–15.5)
WBC: 18.4 10*3/uL — ABNORMAL HIGH (ref 4.0–10.5)
nRBC: 0 % (ref 0.0–0.2)

## 2019-03-09 MED ORDER — SULFAMETHOXAZOLE-TRIMETHOPRIM 800-160 MG PO TABS
1.0000 | ORAL_TABLET | Freq: Two times a day (BID) | ORAL | Status: DC
  Administered 2019-03-09 – 2019-03-10 (×2): 1 via ORAL
  Filled 2019-03-09 (×3): qty 1

## 2019-03-09 NOTE — Progress Notes (Signed)
Physical Therapy Treatment Patient Details Name: Martha Mora MRN: [id number] DOB: 04/04/1946 Today's Date: 03/09/2019    History of Present Illness Pt adm with SOB, fever, and foul smelling urine. Pt found to have E coli UTI with septic shock and PNA. COVID 19 Ruled out. Pt intubated 4/6 and extubated 4/15. Pt also developed afib with RVR. PMH - copd, breast CA, CAD, MI, OA    PT Comments    Limited session ,patient just back from walk with RN and ate lunch, reports fatigue and stomach discomfort from lunch. Agreeable to ambulate short distance to assess balance, improving from prior PT visits now contact guard of +1. Discussed safety considerations for home and advised patient to have daughter guard her for time being. She is agreeable and reports that she will.      Follow Up Recommendations  Home health PT;Supervision/Assistance - 24 hour     Equipment Recommendations  None recommended by PT    Recommendations for Other Services       Precautions / Restrictions Precautions Precautions: Fall Precaution Comments: watch O2 Restrictions Weight Bearing Restrictions: No    Mobility  Bed Mobility Overal bed mobility: Needs Assistance Bed Mobility: Supine to Sit;Sit to Supine     Supine to sit: Min assist Sit to supine: Min assist   General bed mobility comments: min assist for trunk elevation and LE management to and from EOB. Verbal cuing for hand placement, use of bedrails to assist in coming to EOB. PT/OT slid pt up in bed with use of bed pad.  Transfers Overall transfer level: Needs assistance Equipment used: Rolling walker (2 wheeled) Transfers: Sit to/from Stand   Stand pivot transfers: Min guard          Ambulation/Gait Ambulation/Gait assistance: Min guard Gait Distance (Feet): 20 Feet Assistive device: Rolling walker (2 wheeled) Gait Pattern/deviations: Step-through pattern;Decreased stride length;Trunk flexed Gait velocity: decreased   General Gait  Details: min guard for safety, poor strength and balance    Stairs             Wheelchair Mobility    Modified Rankin (Stroke Patients Only)       Balance Overall balance assessment: Needs assistance Sitting-balance support: No upper extremity supported;Feet supported Sitting balance-Leahy Scale: Fair       Standing balance-Leahy Scale: Poor Standing balance comment: reliant on RW for steadying.                             Cognition Arousal/Alertness: Awake/alert Behavior During Therapy: WFL for tasks assessed/performed Overall Cognitive Status: Impaired/Different from baseline Area of Impairment: Problem solving;Safety/judgement                                      Exercises      General Comments        Pertinent Vitals/Pain Faces Pain Scale: Hurts a little bit Pain Location: stomach    Home Living                      Prior Function            PT Goals (current goals can now be found in the care plan section) Acute Rehab PT Goals Patient Stated Goal: go home PT Goal Formulation: With patient/family Time For Goal Achievement: 03/18/19 Potential to Achieve Goals: Good Progress towards PT goals:  Progressing toward goals    Frequency    Min 3X/week      PT Plan Current plan remains appropriate    Co-evaluation              AM-PAC PT "6 Clicks" Mobility   Outcome Measure  Help needed turning from your back to your side while in a flat bed without using bedrails?: None Help needed moving from lying on your back to sitting on the side of a flat bed without using bedrails?: A Little Help needed moving to and from a bed to a chair (including a wheelchair)?: A Little Help needed standing up from a chair using your arms (e.g., wheelchair or bedside chair)?: A Little Help needed to walk in hospital room?: A Little Help needed climbing 3-5 steps with a railing? : A Lot 6 Click Score: 18    End of  Session Equipment Utilized During Treatment: Oxygen;Gait belt Activity Tolerance: Patient limited by fatigue Patient left: in bed;with bed alarm set;with call bell/phone within reach Nurse Communication: Mobility status PT Visit Diagnosis: Unsteadiness on feet (R26.81);Other abnormalities of gait and mobility (R26.89);Muscle weakness (generalized) (M62.81)     Time: 6578-4696 PT Time Calculation (min) (ACUTE ONLY): 10 min  Charges:  $Gait Training: 8-22 mins                    Reinaldo Berber, PT, DPT Acute Rehabilitation Services Pager: [phone] Office: [phone]     Reinaldo Berber 03/09/2019, 12:46 PM

## 2019-03-09 NOTE — Progress Notes (Addendum)
Family Medicine Teaching Service Daily Progress Note Intern Pager: [phone]  Patient name: Martha Mora Medical record number: [id number] Date of birth: 09-25-1946 Age: 73 y.o. Gender: female  Primary Care Provider: [name], DO Consultants: CCM, Urology  Code Status: Full   Pt Overview and Major Events to Date:  Admitted to [place] on 4/5 Transfer of care to CCM of 4/6 and intubated Extubated on 4/15 New onset afib 4/15 CT showing left UPJ stone 4/16 Cystoscopy left retrograde ureterogram and insertion of left ureteral stent 4/21  Assessment and Plan: Zi Sek Smithis a 73 y.o.femaleadmitted for fever and SOBand diagnosed withurosepsis. Also found to haveLLL haziness on CXR. Herchronic conditions include COPD, anxiety, MVR, HTN, GERD, incontinence, HLD, obesity, restless leg syndrome.  E coli bacteremia 2/2 UTI, Obstructing Left Renal Stone S/p rocephin 4/6-4/11, vanc 4/12-4/16, zosyn 4/12-4/16. Preop on rocephin (4/16 - 4/20). Currently on Keflex for 2 week course (4/20-5/5). POD#1 after Cystoscopy left retrograde ureterogram and insertion of left ureteral stent on 4/21. Wbc 17.5>18.4.  Remains afebrile. Per urology will need to dc with trimethroprim 100 mg PO daily with 30 tabs and one refill to start AFTER any antibiotics from hospital  -continue Keflex (4/20 - ), plan to continue for 2 week course per urology recs -Urology following, appreciate recs -will opt to not repeat urine culture given improvement and pending procedure -continue eliquis, will increase to 5mg  on 4/22 -per urology can d/c likely 1-2 days after surgery and patient will have ureterosocpy as outpatient, she is able to be on Eliquis for this procedure   Afib  New onset. Patient also with elevated troponins during ICU admission, 2/2 demand ischemia. CHADSVASC 6. H/o CAD s/p RCA stent.  HR 74, remains in sinus rhythm. -continuous cardiac monitoring  - ASA at home for hx DES, holding ASA for poss  procedure, will need to determine if needed on d/c as she will be d/c'ed on Eliquis. Will need cards approval  - continue eliquis  - continue amiodarone PO 200mg  BID, will continue x 1 week (4/25) and then decrease to QD, per cards recs  Acute diastolic CHF No prior history CHF. EF 4/10 showed EF 55%. Started on Lasix 40mg  PO QD per cards recs.  UOP with many unmeasured urine occurrences.  Weight down unmeasured, last recorded appears to be ~dry weight.   Volume status on exam appears euvolemic. -cont Lasix 40mg  PO QD, per cards -Strict I/Os, daily weights, telemetry  -Daily monitoring renal function -cardiology consulted, appreciate recommendations   Diarrhea: resolved  Resolved. Improved with discontinuation of Senna.  - cont to monitor - cont senna qhs prn  Acute hypoxemic respiratory failure  Resolved. In setting of septic shock. Patient intubated on 4/6 with successful extubation on 4/15. COVID negative. No h/o home O2 use. Continues to be on 1L per [place].  Will continues to wean as able as resp status continues to improve. -continue PRN albuterol  -continue PRN O2, goal of O2 sat 88-95%, wean as tolerated  AKI: Resolved Basline ~ 0.8, this AM 0.96. -monitor BMP  Hypokalemia: Resolved K 3.6>3.7.  Will continue to monitor now that patient is on daily Lasix PO. -follow on daily BMP  COPD Albuterol PRN at home. Smoking history with recent vaping use.  -continue albuterol PRN -encourage smoking cessation   HTN Home meds: amlodipine 5mg  daily, coreg 3.125 mg bid, losartan 100 mg nightly. BP 167/65.. 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 Followed by Dr. [name] (outpatiet cardiology). Placed on aspirin 325 by Dr. [name] during visit in September 2019. Was on DAPT x 1 year.ASCVD risk or is 21.3%. -holding ASA in  setting of eliquis, holding eliquis in setting of poss procedure, on hep gtt -continue statin  -cardiology consulted, appreciate recommendations  - will speak with cards prior to d/c regarding ASA use on discharge  Anemia Likely 2/2 acute illness.  Hgb stable at 8.9 -follow on daily CBC  Restless leg syndrome  Home meds: Requip 3mg  qhs -continue requip   Anxiety/Insomnia Home meds: celexa 40mg  daily, trazodone 50mg  qhs PRN.  Added melatonin last PM, patient notes that she did have some improvement with melatonin. - continue home meds  - melatonin 3mg  QHS prn  FEN/GI: regular diet, senna qhs prn PPx: hep gtt  Disposition: dc per urology and cardiology recommendations   Subjective:  Patient states she feels significantly better. States she is very happy she was able to get stent by urology. States urologist saw her this AM and cleared her for dc if cardiology approves. Is hopeful to go home today.   Objective: Temp:  [98.1 F (36.7 C)-99 F (37.2 C)] 98.5 F (36.9 C) (04/21 1141) Pulse Rate:  [69-138] 69 (04/21 1141) Resp:  [17-31] 18 (04/21 1141) BP: (118-158)/(46-75) 118/58 (04/21 1141) SpO2:  [87 %-100 %] 90 % (04/21 1141) Physical Exam: General: awake and alert, sitting in recliner watching television, NAD  Cardiovascular: RRR, no MRG  Respiratory: CTAB, no wheezes, rales, or rhonchi. No increased WOB. Speaking full sentences. No accessory muscle use.  Abdomen: soft, non tender, non distended, bowel sounds present  Extremities: no edema, non tender   Laboratory: Recent Labs  Lab 03/07/19 0530 03/08/19 0426 03/09/19 0527  WBC 18.7* 17.5* 18.4*  HGB 9.1* 9.1* 8.9*  HCT 32.0* 30.2* 29.8*  PLT 398 410* 394   Recent Labs  Lab 03/07/19 0530 03/08/19 0426 03/09/19 0527  NA 141 140 140  K 3.1* 3.6 3.7  CL 100 103 103  CO2 32 29 25  BUN 14 14 14   CREATININE 0.86 0.84 0.96  CALCIUM 8.9 8.9 9.1  GLUCOSE 132* 118* 143*     Imaging/Diagnostic Tests: Ct  Abdomen Pelvis W Wo Contrast  Result Date: 03/04/2019 CLINICAL DATA:  Abdominal pain and fever.  Abscess suspected. EXAM: CT ABDOMEN AND PELVIS WITHOUT AND WITH CONTRAST TECHNIQUE: Multidetector CT imaging of the abdomen and pelvis was performed following the standard protocol before and following the bolus administration of intravenous contrast. CONTRAST:  84mL OMNIPAQUE IOHEXOL 300 MG/ML  SOLN COMPARISON:  01/21/2019 FINDINGS: Lower chest: Patchy airspace disease both lower lungs suggest multifocal pneumonia. Small bilateral pleural effusions evident. Enlargement of the pulmonary arteries suggests pulmonary arterial hypertension. Hepatobiliary: Insert normal infused liver gallbladder surgically absent. No intrahepatic or extrahepatic biliary dilation. Pancreas: No focal mass lesion. No dilatation of the main duct. No intraparenchymal cyst. No peripancreatic edema. Spleen: No splenomegaly. No focal mass lesion. Adrenals/Urinary Tract: No adrenal nodule or mass. Precontrast imaging shows a 2 mm nonobstructing stone in the lower pole the right kidney. Right ureter unremarkable. 9 x 14 x 9 mm stone is identified in the left renal pelvis (best seen coronal image 83/series 11) with mild to moderate left hydronephrosis. Decreased perfusion to the left kidney is compatible with obstructive uropathy. Probable 12 mm cyst lower pole left kidney, similar to  prior. Left ureter unremarkable. The urinary bladder appears normal for the degree of distention. Stomach/Bowel: Stomach is decompressed. Duodenum is normally positioned as is the ligament of Treitz. No small bowel wall thickening. No small bowel dilatation. Colon is nondilated. Rectal tube evident. Vascular/Lymphatic: There is abdominal aortic atherosclerosis without aneurysm. There is no gastrohepatic or hepatoduodenal ligament lymphadenopathy. No intraperitoneal or retroperitoneal lymphadenopathy. No pelvic sidewall lymphadenopathy. Reproductive: The uterus is  surgically absent. There is no adnexal mass. Other: No intraperitoneal free fluid. Musculoskeletal: Small umbilical hernia contains only fat. Subcutaneous edema noted lateral right abdominal wall. Status post ORIF left proximal femur. Stable small sclerotic focus right femoral head, likely benign. No worrisome lytic or sclerotic osseous abnormality. IMPRESSION: 1. 9 x 14 x 9 mm left UPJ stone causes mild to moderate left hydronephrosis and evidence of obstructive uropathy. Given the history of fever, infection proximal to the stone is a consideration. 2. Subcutaneous edema identified lateral right abdominal wall. Given the asymmetry, cellulitis cannot be excluded. 3. Patchy airspace disease in both lower lobes with small bilateral pleural effusions. Multifocal pneumonia consideration. 4. Enlargement of the main pulmonary arteries suggests pulmonary arterial hypertension. 5.  Aortic Atherosclerois (ICD10-170.0) Electronically Signed   By: Misty Stanley M.D.   On: 03/04/2019 21:20   Dg Retrograde Pyelogram  Result Date: 03/08/2019 CLINICAL DATA:  Left ureteral stent placement EXAM: INTRAOPERATIVE LEFT RETROGRADE UROGRAPHY TECHNIQUE: Images were obtained with the C-arm fluoroscopic device intraoperatively and submitted for interpretation post-operatively. Please see the procedural report for the amount of contrast and the fluoroscopy time utilized. COMPARISON:  None. FINDINGS: Images demonstrate cannulation of the left ureteral orifice and contrast filling the collecting system. There is a filling defect at the ureteropelvic junction. There is a large filling defect within the renal pelvis. Final image demonstrates a stent extending from the left renal collecting system to the bladder. IMPRESSION: Left ureteral stent placement. Electronically Signed   By: Marybelle Killings M.D.   On: 03/08/2019 13:03   US Renal  Result Date: 02/27/2019 CLINICAL DATA:  Initial evaluation for acute renal failure, UTI. EXAM: RENAL /  URINARY TRACT ULTRASOUND COMPLETE COMPARISON:  Prior CT from 01/21/2019 FINDINGS: Right Kidney: Renal measurements: 10.4 x 5.5 x 6.5 cm. Echogenicity within normal limits. No mass or hydronephrosis visualized. Left Kidney: Not visualized. Bladder: Bladder decompressed with a Foley catheter in place. IMPRESSION: 1. Technically limited exam due to body habitus and patient's inability to cooperate with the study. 2. Normal right kidney without hydronephrosis. 3. Nonvisualization of the left kidney. Electronically Signed   By: Jeannine Boga M.D.   On: 02/27/2019 07:37   Dg Chest Port 1 View  Result Date: 03/05/2019 CLINICAL DATA:  PICC line placement. EXAM: PORTABLE CHEST 1 VIEW COMPARISON:  03/04/2019 FINDINGS: 2156 hours. Right PICC line tip is overlying the mid SVC level. Marked interval improvement and bilateral airspace disease seen previously with only minimal residual in the right base currently. Probable tiny bilateral pleural effusions. Cardiopericardial silhouette is at upper limits of normal for size. The visualized bony structures of the thorax are intact. Telemetry leads overlie the chest. IMPRESSION: 1. Marked interval decrease in bilateral diffuse airspace opacity. 2. Right PICC line tip overlies the mid SVC level. Electronically Signed   By: Misty Stanley M.D.   On: 03/05/2019 21:33   Dg Chest Port 1 View  Result Date: 03/04/2019 CLINICAL DATA:  Hypoxia EXAM: PORTABLE CHEST 1 VIEW COMPARISON:  03/03/2019 FINDINGS: Endotracheal and NG tubes removed. Right PICC is stable. Heterogeneous  opacities scattered throughout both lungs are worse. Low lung volumes. No pneumothorax. IMPRESSION: Extubated. Worsening bilateral airspace disease. Electronically Signed   By: Marybelle Killings M.D.   On: 03/04/2019 10:53   Dg Chest Port 1 View  Result Date: 03/03/2019 CLINICAL DATA:  Endotracheal tube present. EXAM: PORTABLE CHEST 1 VIEW COMPARISON:  March 02, 2019 FINDINGS: The right PICC line terminates in  the central SVC. The ETT is in good position. The NG tube terminates below today's study. Cardiomegaly. The hila and mediastinum are unremarkable. Mild interstitial prominence. Probable mild atelectasis in the right base. IMPRESSION: 1. Support apparatus as above. 2. Cardiomegaly and mild pulmonary edema. Mild atelectasis in the right base. Electronically Signed   By: Dorise Bullion III M.D   On: 03/03/2019 08:42   Dg Chest Port 1 View  Result Date: 03/02/2019 CLINICAL DATA:  Hypoxia EXAM: PORTABLE CHEST 1 VIEW COMPARISON:  February 28, 2019 FINDINGS: Endotracheal tube tip is 2.4 cm above the carina. Nasogastric tube tip is in the proximal stomach with the side port above the gastroesophageal junction. Central catheter tip is in the superior vena cava. No pneumothorax. There is cardiomegaly with pulmonary venous hypertension. There are pleural effusions bilaterally. There is atelectatic change in the right base. There is a degree of underlying interstitial edema. Surgical clips are noted in the right axillary region. No bone lesions. No adenopathy evident. IMPRESSION: Tube and catheter positions as described without pneumothorax. Note that the side port of the nasogastric tube is above the diaphragm. Advise advancing nasogastric tube 8-10 cm. Pulmonary vascular congestion with interstitial edema and bilateral pleural effusions. Suspect a degree of underlying congestive heart failure. There may be a degree of ARDS as well. No consolidation. There is right base atelectasis. Electronically Signed   By: Lowella Grip III M.D.   On: 03/02/2019 09:50   Dg Chest Port 1 View  Result Date: 02/28/2019 CLINICAL DATA:  Possible COVID-19.  Endotracheal tube EXAM: PORTABLE CHEST 1 VIEW COMPARISON:  02/27/2019 FINDINGS: Endotracheal tube in good position. Right arm PICC tip in the SVC unchanged. NG in the stomach Cardiac enlargement. Progression of bilateral airspace disease diffusely, left greater than right. Small  bilateral effusions have progressed. Progression of left lower lobe consolidation. IMPRESSION: Significant progression of diffuse bilateral airspace disease left greater than right which may represent pneumonia or edema. Progression of bilateral effusions. Support lines remain in good position and unchanged. Electronically Signed   By: Franchot Gallo M.D.   On: 02/28/2019 08:10   Dg Chest Port 1 View  Result Date: 02/27/2019 CLINICAL DATA:  Intubated EXAM: PORTABLE CHEST 1 VIEW COMPARISON:  Chest radiograph from one day prior. FINDINGS: Endotracheal tube tip is 3.2 cm above the carina. Enteric tube enters stomach with the tip not seen on this image. Right PICC terminates in middle third of the SVC. Surgical clips overlie the right axilla. Stable cardiomediastinal silhouette with mild to moderate cardiomegaly. No pneumothorax. No pleural effusion. Diffuse hazy parahilar lung opacities appears slightly improved. IMPRESSION: 1. Well-positioned support structures. 2. Stable cardiomegaly. 3. Slight improvement in diffuse hazy parahilar lung opacities, suggesting improving pulmonary edema. Electronically Signed   By: Ilona Sorrel M.D.   On: 02/27/2019 07:58   Dg Chest Port 1 View  Result Date: 02/26/2019 CLINICAL DATA:  Acute respiratory failure.  Ventilator support. EXAM: PORTABLE CHEST 1 VIEW COMPARISON:  For a 2020 FINDINGS: Endotracheal tube tip 2.5 cm above the carina. Orogastric or nasogastric tube enters the abdomen. Right arm PICC tip in  the SVC above the right atrium. Slight increased aeration in the lower lobes. Widespread patchy pulmonary infiltrates persist and show slight worsening. IMPRESSION: Better aeration in both lower lobes. However, there is also a pattern of worsening patchy airspace density within both lungs which seems to indicate worsening infiltrate. Electronically Signed   By: Nelson Chimes M.D.   On: 02/26/2019 06:19   Dg Chest Port 1 View  Result Date: 02/24/2019 CLINICAL DATA:  Acute  respiratory failure EXAM: PORTABLE CHEST 1 VIEW COMPARISON:  02/23/2019 FINDINGS: Right PICC line, endotracheal tube and NG tube are unchanged. Cardiomegaly with vascular congestion. Bilateral lower lobe airspace opacities and layering effusions. IMPRESSION: Layering bilateral effusions with bibasilar atelectasis or infiltrates. Cardiomegaly, vascular congestion. Electronically Signed   By: Rolm Baptise M.D.   On: 02/24/2019 08:21   Dg Chest Port 1 View  Result Date: 02/23/2019 CLINICAL DATA:  Check endotracheal tube EXAM: PORTABLE CHEST 1 VIEW COMPARISON:  02/22/2019 FINDINGS: Cardiac shadow is prominent but stable. The endotracheal tube lies at the level of the carina directed towards right mainstem bronchus and should be withdrawn 2-3 cm. Gastric catheter extends into the stomach. Increasing right basilar density with associated effusion is seen. Slight improved aeration in the left base is noted. Postsurgical changes in the right axilla are seen. No bony abnormality is noted. Right-sided PICC line is noted in satisfactory position. IMPRESSION: Endotracheal tube directed towards the right mainstem bronchus. This should be withdrawn 2-3 cm. Increasing right basilar opacity with associated effusion. Some improved aeration in the left base is noted. Electronically Signed   By: Inez Catalina M.D.   On: 02/23/2019 08:11   Dg Chest Port 1 View  Result Date: 02/22/2019 CLINICAL DATA:  Respiratory failure EXAM: PORTABLE CHEST 1 VIEW COMPARISON:  02/21/2019 FINDINGS: Endotracheal tube placed. Tip is 1.5 cm from the carina. Left basilar consolidation is stable. Right lung is under aerated and clear. No pneumothorax or pleural effusion. IMPRESSION: Stable left basilar consolidation. Endotracheal tube placed. Electronically Signed   By: Marybelle Killings M.D.   On: 02/22/2019 07:56   Dg Chest Portable 1 View  Result Date: 02/21/2019 CLINICAL DATA:  Cough, tremors for 1 hour EXAM: PORTABLE CHEST 1 VIEW COMPARISON:  CT  chest 01/05/2019, chest x-ray 12/12/2017 FINDINGS: There is hazy left lower lobe airspace disease concerning for pneumonia. There is no pleural effusion or pneumothorax. The heart and mediastinal contours are unremarkable. The osseous structures are unremarkable. IMPRESSION: Hazy left lower lobe airspace disease concerning for pneumonia. Electronically Signed   By: Kathreen Devoid   On: 02/21/2019 14:30   Dg Abd Portable 1v  Result Date: 03/02/2019 CLINICAL DATA:  NG tube placement EXAM: PORTABLE ABDOMEN - 1 VIEW COMPARISON:  None. FINDINGS: Nasogastric tube tip and side port project over the stomach. Side port is just beyond the gastroesophageal junction. No dilated bowel visualized. IMPRESSION: Nasogastric tube tip and side port over the stomach. Electronically Signed   By: Ulyses Jarred M.D.   On: 03/02/2019 18:32   Dg Abd Portable 1v  Result Date: 02/22/2019 CLINICAL DATA:  Status post OG tube placement. EXAM: PORTABLE ABDOMEN - 1 VIEW COMPARISON:  None. FINDINGS: OG tube is in place and looped in the distal stomach, likely at the pylorus, with the tip projecting slightly retrograde. IMPRESSION: As above. Electronically Signed   By: Inge Rise M.D.   On: 02/22/2019 10:33   [place] Ekg Site Rite  Result Date: 03/05/2019 If Site Rite image not attached, placement could not be  confirmed due to current cardiac rhythm.  [place] Ekg Site Rite  Result Date: 02/22/2019 If Site Rite image not attached, placement could not be confirmed due to current cardiac rhythm.    Caroline More, DO 03/09/2019, 12:06 PM PGY-2, [facility] Intern pager: [phone], text pages welcome

## 2019-03-09 NOTE — Progress Notes (Signed)
SATURATION QUALIFICATIONS: (This note is used to comply with regulatory documentation for home oxygen)  Patient Saturations on Room Air at Rest = 91%  Patient Saturations on Room Air while Ambulating = 90%  Patient Saturations on  Liters of oxygen while Ambul.- NA%  Please briefly explain why patient needs home oxygen: Does not need o2 inhalation

## 2019-03-09 NOTE — Progress Notes (Signed)
No stent pain Looks good Please send home in Trimethoprim 100 mg po daily with 30 tabs and one refill to start AFTER any antibiotics from hospital; for prophylaxis Will follow and schedule her for U=scope stone removal in few weeks

## 2019-03-09 NOTE — Consult Note (Signed)
Late entry Ref: [name], DO   Subjective:  Awaiting urology procedure of stent placement. VS stable. Hypokalemia has improved.  Objective:  Vital Signs in the last 24 hours: Temp:  [98.1 F (36.7 C)-99 F (37.2 C)] 99 F (37.2 C) (04/21 0332) Pulse Rate:  [70-138] 70 (04/21 0740) Cardiac Rhythm: Normal sinus rhythm (04/21 0743) Resp:  [17-31] 17 (04/21 0740) BP: (123-158)/(46-75) 124/75 (04/21 0740) SpO2:  [87 %-100 %] 100 % (04/21 0740) Weight:  [95.5 kg] 95.5 kg (04/20 1134)  Physical Exam: BP Readings from Last 1 Encounters:  03/09/19 124/75     Wt Readings from Last 1 Encounters:  03/08/19 95.5 kg    Weight change: 0 kg Body mass index is 42.52 kg/m. HEENT: /AT, Eyes-Brown, PERL, EOMI, Conjunctiva-Pale, Sclera-Non-icteric Neck: No JVD, No bruit, Trachea midline. Lungs:  Clearing, Bilateral. Cardiac:  Regular rhythm, normal S1 and S2, no S3. II/VI systolic murmur. Abdomen:  Soft, non-tender. BS present. Extremities:  No edema present. No cyanosis. No clubbing. CNS: AxOx3, Cranial nerves grossly intact, moves all 4 extremities.  Skin: Warm and dry.   Intake/Output from previous day: 04/20 0701 - 04/21 0700 In: 1321 [P.O.:520; I.V.:701; IV Piggyback:100] Out: 0     Lab Results: BMET    Component Value Date/Time   NA 140 03/09/2019 0527   NA 140 03/08/2019 0426   NA 141 03/07/2019 0530   NA 141 08/08/2017 1009   NA 140 02/05/2017 0941   NA 142 08/05/2016 1022   K 3.7 03/09/2019 0527   K 3.6 03/08/2019 0426   K 3.1 (L) 03/07/2019 0530   K 4.0 08/08/2017 1009   K 4.1 02/05/2017 0941   K 4.1 08/05/2016 1022   CL 103 03/09/2019 0527   CL 103 03/08/2019 0426   CL 100 03/07/2019 0530   CO2 25 03/09/2019 0527   CO2 29 03/08/2019 0426   CO2 32 03/07/2019 0530   CO2 25 08/08/2017 1009   CO2 23 02/05/2017 0941   CO2 25 08/05/2016 1022   GLUCOSE 143 (H) 03/09/2019 0527   GLUCOSE 118 (H) 03/08/2019 0426   GLUCOSE 132 (H) 03/07/2019 0530   GLUCOSE  95 08/08/2017 1009   GLUCOSE 120 02/05/2017 0941   GLUCOSE 107 08/05/2016 1022   BUN 14 03/09/2019 0527   BUN 14 03/08/2019 0426   BUN 14 03/07/2019 0530   BUN 21.7 08/08/2017 1009   BUN 14.4 02/05/2017 0941   BUN 12.0 08/05/2016 1022   CREATININE 0.96 03/09/2019 0527   CREATININE 0.84 03/08/2019 0426   CREATININE 0.86 03/07/2019 0530   CREATININE 0.8 08/08/2017 1009   CREATININE 0.8 02/05/2017 0941   CREATININE 0.8 08/05/2016 1022   CALCIUM 9.1 03/09/2019 0527   CALCIUM 8.9 03/08/2019 0426   CALCIUM 8.9 03/07/2019 0530   CALCIUM 9.0 08/08/2017 1009   CALCIUM 9.4 02/05/2017 0941   CALCIUM 8.8 08/05/2016 1022   GFRNONAA 58 (L) 03/09/2019 0527   GFRNONAA >60 03/08/2019 0426   GFRNONAA >60 03/07/2019 0530   GFRNONAA 82 10/23/2015 1158   GFRAA >60 03/09/2019 0527   GFRAA >60 03/08/2019 0426   GFRAA >60 03/07/2019 0530   GFRAA >89 10/23/2015 1158   CBC    Component Value Date/Time   WBC 18.4 (H) 03/09/2019 0527   RBC 3.32 (L) 03/09/2019 0527   HGB 8.9 (L) 03/09/2019 0527   HGB 12.7 12/10/2018 0844   HGB 12.8 08/08/2017 1009   HCT 29.8 (L) 03/09/2019 0527   HCT 39.8 12/10/2018 0844  HCT 39.8 08/08/2017 1009   PLT 394 03/09/2019 0527   PLT 297 12/10/2018 0844   MCV 89.8 03/09/2019 0527   MCV 83 12/10/2018 0844   MCV 93.9 08/08/2017 1009   MCH 26.8 03/09/2019 0527   MCHC 29.9 (L) 03/09/2019 0527   RDW 17.8 (H) 03/09/2019 0527   RDW 14.9 12/10/2018 0844   RDW 14.8 (H) 08/08/2017 1009   LYMPHSABS 2.4 03/06/2019 0545   LYMPHSABS 3.2 08/08/2017 1009   MONOABS 1.2 (H) 03/06/2019 0545   MONOABS 1.2 (H) 08/08/2017 1009   EOSABS 0.7 (H) 03/06/2019 0545   EOSABS 0.1 08/08/2017 1009   BASOSABS 0.1 03/06/2019 0545   BASOSABS 0.1 08/08/2017 1009   HEPATIC Function Panel Recent Labs    02/27/19 0236 02/28/19 0500 03/01/19 0500  PROT 5.9* 5.6* 5.4*   HEMOGLOBIN A1C No components found for: HGA1C,  MPG CARDIAC ENZYMES Lab Results  Component Value Date   TROPONINI  1.82 (HH) 03/04/2019   TROPONINI 2.18 (HH) 03/03/2019   TROPONINI 1.83 (HH) 03/03/2019   BNP No results for input(s): PROBNP in the last 8760 hours. TSH Recent Labs    10/06/18 1051  TSH 1.450   CHOLESTEROL Recent Labs    10/06/18 1051  CHOL 153    Scheduled Meds: . amiodarone  200 mg Oral BID  . apixaban  2.5 mg Oral BID  . cephALEXin  500 mg Oral Q6H  . Chlorhexidine Gluconate Cloth  6 each Topical Daily  . citalopram  40 mg Oral Daily  . feeding supplement (ENSURE ENLIVE)  237 mL Oral Q24H  . furosemide  40 mg Oral Daily  . losartan  50 mg Oral Daily  . mouth rinse  15 mL Mouth Rinse BID  . multivitamin with minerals  1 tablet Oral Daily  . nicotine  21 mg Transdermal Daily  . pantoprazole  40 mg Oral Daily  . ramelteon  8 mg Oral QHS  . rOPINIRole  3 mg Oral QHS  . rosuvastatin  20 mg Oral Daily  . sodium chloride flush  10-40 mL Intracatheter Q12H  . traZODone  50 mg Oral QHS   Continuous Infusions: . lactated ringers 10 mL/hr at 03/08/19 1140   PRN Meds:.acetaminophen, albuterol, hydrALAZINE, Melatonin, ondansetron (ZOFRAN) IV, polyvinyl alcohol, senna-docusate, sodium chloride flush  Assessment/Plan: Acute on chronic respiratory failure COPD Paroxysmal atrial fibrillation E. Coli sepsis Urolithiasis CAD S/P stent in LAD Obesity OSA Troponin-I from demand ischemia Hypokalemia, resolved Type 2 DM  Awaiting urology procedure   LOS: 14 days    [name]  MD  03/09/2019, 9:30 AM

## 2019-03-09 NOTE — TOC Initial Note (Addendum)
Transition of Care [facility]) - Initial/Assessment Note    Patient Details  Name: Martha Mora MRN: [id number] Date of Birth: 1946-06-03  [facility]) CM/SW Contact:    Maryclare Labrador, RN Phone Number: 03/09/2019, 4:50 PM  Clinical Narrative:   PTA independent from home with daughter.  Pt informed CM that she has PCP and denied barriers with paying for meds.  Pt will discharge home on Eliquis - pt informed of ongoing copay - CSW will provide free 30 day card.  Pt given [place] choice list by CSW - pt chose [facility] - agency contacted and referral accepted.  Pt chose [facility] for DME - agency accepted referral    Update:  CM contacted about making arrangements for home oxygen for pt however pt did not qualify for oxygen based on pulm ambulatory note.  Attending request oxygen for just night time - CM explained nocturnal oxygen would not be covered by insurance unless overnight oxymetry test is performed while hospitalized.  [facility] also reviewed pts chart for current documentation relating to desats while sleeping however current documentation is insufficient.  [facility] can provide oxygen at discharge however this will be at cost for via a signed waiver.    Expected Discharge Plan: [place] Barriers to Discharge: Continued Medical Work up   Patient Goals and CMS Choice Patient states their goals for this hospitalization and ongoing recovery are:: to go home to daughter's house CMS Medicare.gov Compare Post Acute Care list provided to:: Patient Represenative (must comment) Choice offered to / list presented to : Patient  Expected Discharge Plan and Services Expected Discharge Plan: [place]   Discharge Planning Services: CM Consult Post Acute Care Choice: [place] arrangements for the past 2 months: Single Family Home                 DME Arranged: [place], 3:1 DME Agency: [facility] HH Arranged: PT, OT HH Agency: [place]  Prior Living Arrangements/Services Living arrangements for the past 2 months: Single Family Home Lives with:: Adult Children Patient language and need for interpreter reviewed:: Yes Do you feel safe going back to the place where you live?: Yes      Need for Family Participation in Patient Care: No (Comment) Care giver support system in place?: Yes (comment)   Criminal Activity/Legal Involvement Pertinent to Current Situation/Hospitalization: No - Comment as needed  Activities of Daily Living      Permission Sought/Granted Permission sought to share information with : Case Manager Permission granted to share information with : Yes, Verbal Permission Granted     Permission granted to share info w AGENCY: ([facility] and [facility])        Emotional Assessment Appearance:: Appears stated age Attitude/Demeanor/Rapport: Engaged, Self-Confident, Gracious Affect (typically observed): Accepting, [facility] Orientation: : Oriented to Self, Oriented to Place, Oriented to  Time, Oriented to Situation   Psych Involvement: No (comment)  Admission diagnosis:  AKI (acute kidney injury) ([place]) [N17.9] Urinary tract infection without hematuria, site unspecified [N39.0] Community acquired pneumonia of left lung, unspecified part of lung [J18.9] Sepsis with encephalopathy, due to unspecified organism, unspecified whether septic shock present ([place]) [A41.9, R65.20, G93.40] CAP (community acquired pneumonia) [J18.9] Acute respiratory failure with hypoxia ([place]) [J96.01] Patient Active Problem List   Diagnosis Date Noted  . Sepsis with encephalopathy ([place])   . UTI (urinary tract infection)   . Acute respiratory failure with hypoxemia ([place]) 02/22/2019  . Pneumonia 02/21/2019  .  CAP (community acquired pneumonia) 02/21/2019  . Abnormal urine odor 10/06/2018  . Insomnia 09/08/2016  . Chest pain on exertion 05/23/2016  . Mixed incontinence 10/25/2015  . Other fatigue 10/25/2015  . Restless leg  syndrome 05/24/2015  . Stress incontinence 02/02/2015  . Mitral valve regurgitation 05/23/2014  . Dysfunctional uterine bleeding 09/02/2013  . Breast cancer of lower-outer quadrant of right female breast ([place]) 07/24/2013  . Postmenopausal bleeding 07/14/2013  . Osteopenia 12/27/2011  . ANXIETY 07/19/2009  . Osteoarthritis 07/19/2009  . NAUSEA 07/19/2009  . IRRITABLE BOWEL SYNDROME 11/17/2008  . PERSONAL HX COLONIC POLYPS 07/19/2008  . COLONIC POLYPS, ADENOMATOUS 11/17/2007  . CARCINOMA IN SITU OF BREAST 11/17/2007  . Hyperlipidemia 11/17/2007  . Obesity, unspecified 11/17/2007  . Depression 11/17/2007  . Essential hypertension 11/17/2007  . Coronary atherosclerosis 11/17/2007  . HEMORRHOIDS, INTERNAL 11/17/2007  . GERD 11/17/2007  . PEPTIC STRICTURE 11/17/2007  . SLEEP APNEA 11/17/2007  . Diverticulosis of colon (without mention of hemorrhage) 09/05/2005  . REFLUX ESOPHAGITIS 06/01/2002  . ESOPHAGEAL STRICTURE 06/01/2002   PCP:  [name], DO Pharmacy:   [id number] - [place], [place] [address] [place] [address] Phone: [phone] Fax: [phone]  [place], [place] [facility] [address] [place] [address] [place] [address] Phone: [phone] Fax: [phone]     Social Determinants of Health (SDOH) Interventions    Readmission Risk Interventions No flowsheet data found.

## 2019-03-09 NOTE — Progress Notes (Signed)
FPTS Interim Progress Note  Spoke to Dr. [name] on the phone regarding dc plans. Per Dr. [name] patient is stable for discharge with outpatient follow up. On discharge, hold ASA. Patient should be stable on eliquis alone. For BP management, continue to hold norvasc and beta blocker. Continue losartan. Dr. [name] will add back medications at outpatient follow up as BP allows.   Caroline More, DO 03/09/2019, 1:55 PM PGY-2, [place] Medicine Service pager [phone]

## 2019-03-09 NOTE — Progress Notes (Signed)
Consulted with Dr. Tammi Klippel overnight pulse oximetry testing for pt. Per Dr. Tammi Klippel, called [facility] to ensure they were aware of pulse ox order.

## 2019-03-09 NOTE — Consult Note (Signed)
Ref: [name], DO   Subjective:  Sitting up. Underwent left ureteral stent placement yesterday.  Objective:  Vital Signs in the last 24 hours: Temp:  [98.1 F (36.7 C)-99 F (37.2 C)] 99 F (37.2 C) (04/21 0332) Pulse Rate:  [70-138] 70 (04/21 0740) Cardiac Rhythm: Normal sinus rhythm (04/21 0743) Resp:  [17-31] 17 (04/21 0740) BP: (123-158)/(46-75) 124/75 (04/21 0740) SpO2:  [87 %-100 %] 100 % (04/21 0740) Weight:  [95.5 kg] 95.5 kg (04/20 1134)  Physical Exam: BP Readings from Last 1 Encounters:  03/09/19 124/75     Wt Readings from Last 1 Encounters:  03/08/19 95.5 kg    Weight change: 0 kg Body mass index is 42.52 kg/m. HEENT: /AT, Eyes-Brown, PERL, EOMI, Conjunctiva-Pale, Sclera-Non-icteric Neck: No JVD, No bruit, Trachea midline. Lungs:  Clearing, Bilateral. Cardiac:  Regular rhythm, normal S1 and S2, no S3. II/VI systolic murmur. Abdomen:  Soft, non-tender. BS present. Extremities:  No edema present. No cyanosis. No clubbing. CNS: AxOx3, Cranial nerves grossly intact, moves all 4 extremities.  Skin: Warm and dry.   Intake/Output from previous day: 04/20 0701 - 04/21 0700 In: 1321 [P.O.:520; I.V.:701; IV Piggyback:100] Out: 0     Lab Results: BMET    Component Value Date/Time   NA 140 03/09/2019 0527   NA 140 03/08/2019 0426   NA 141 03/07/2019 0530   NA 141 08/08/2017 1009   NA 140 02/05/2017 0941   NA 142 08/05/2016 1022   K 3.7 03/09/2019 0527   K 3.6 03/08/2019 0426   K 3.1 (L) 03/07/2019 0530   K 4.0 08/08/2017 1009   K 4.1 02/05/2017 0941   K 4.1 08/05/2016 1022   CL 103 03/09/2019 0527   CL 103 03/08/2019 0426   CL 100 03/07/2019 0530   CO2 25 03/09/2019 0527   CO2 29 03/08/2019 0426   CO2 32 03/07/2019 0530   CO2 25 08/08/2017 1009   CO2 23 02/05/2017 0941   CO2 25 08/05/2016 1022   GLUCOSE 143 (H) 03/09/2019 0527   GLUCOSE 118 (H) 03/08/2019 0426   GLUCOSE 132 (H) 03/07/2019 0530   GLUCOSE 95 08/08/2017 1009   GLUCOSE  120 02/05/2017 0941   GLUCOSE 107 08/05/2016 1022   BUN 14 03/09/2019 0527   BUN 14 03/08/2019 0426   BUN 14 03/07/2019 0530   BUN 21.7 08/08/2017 1009   BUN 14.4 02/05/2017 0941   BUN 12.0 08/05/2016 1022   CREATININE 0.96 03/09/2019 0527   CREATININE 0.84 03/08/2019 0426   CREATININE 0.86 03/07/2019 0530   CREATININE 0.8 08/08/2017 1009   CREATININE 0.8 02/05/2017 0941   CREATININE 0.8 08/05/2016 1022   CALCIUM 9.1 03/09/2019 0527   CALCIUM 8.9 03/08/2019 0426   CALCIUM 8.9 03/07/2019 0530   CALCIUM 9.0 08/08/2017 1009   CALCIUM 9.4 02/05/2017 0941   CALCIUM 8.8 08/05/2016 1022   GFRNONAA 58 (L) 03/09/2019 0527   GFRNONAA >60 03/08/2019 0426   GFRNONAA >60 03/07/2019 0530   GFRNONAA 82 10/23/2015 1158   GFRAA >60 03/09/2019 0527   GFRAA >60 03/08/2019 0426   GFRAA >60 03/07/2019 0530   GFRAA >89 10/23/2015 1158   CBC    Component Value Date/Time   WBC 18.4 (H) 03/09/2019 0527   RBC 3.32 (L) 03/09/2019 0527   HGB 8.9 (L) 03/09/2019 0527   HGB 12.7 12/10/2018 0844   HGB 12.8 08/08/2017 1009   HCT 29.8 (L) 03/09/2019 0527   HCT 39.8 12/10/2018 0844   HCT 39.8 08/08/2017 1009  PLT 394 03/09/2019 0527   PLT 297 12/10/2018 0844   MCV 89.8 03/09/2019 0527   MCV 83 12/10/2018 0844   MCV 93.9 08/08/2017 1009   MCH 26.8 03/09/2019 0527   MCHC 29.9 (L) 03/09/2019 0527   RDW 17.8 (H) 03/09/2019 0527   RDW 14.9 12/10/2018 0844   RDW 14.8 (H) 08/08/2017 1009   LYMPHSABS 2.4 03/06/2019 0545   LYMPHSABS 3.2 08/08/2017 1009   MONOABS 1.2 (H) 03/06/2019 0545   MONOABS 1.2 (H) 08/08/2017 1009   EOSABS 0.7 (H) 03/06/2019 0545   EOSABS 0.1 08/08/2017 1009   BASOSABS 0.1 03/06/2019 0545   BASOSABS 0.1 08/08/2017 1009   HEPATIC Function Panel Recent Labs    02/27/19 0236 02/28/19 0500 03/01/19 0500  PROT 5.9* 5.6* 5.4*   HEMOGLOBIN A1C No components found for: HGA1C,  MPG CARDIAC ENZYMES Lab Results  Component Value Date   TROPONINI 1.82 (HH) 03/04/2019    TROPONINI 2.18 (HH) 03/03/2019   TROPONINI 1.83 (HH) 03/03/2019   BNP No results for input(s): PROBNP in the last 8760 hours. TSH Recent Labs    10/06/18 1051  TSH 1.450   CHOLESTEROL Recent Labs    10/06/18 1051  CHOL 153    Scheduled Meds: . amiodarone  200 mg Oral BID  . apixaban  2.5 mg Oral BID  . cephALEXin  500 mg Oral Q6H  . Chlorhexidine Gluconate Cloth  6 each Topical Daily  . citalopram  40 mg Oral Daily  . feeding supplement (ENSURE ENLIVE)  237 mL Oral Q24H  . furosemide  40 mg Oral Daily  . losartan  50 mg Oral Daily  . mouth rinse  15 mL Mouth Rinse BID  . multivitamin with minerals  1 tablet Oral Daily  . nicotine  21 mg Transdermal Daily  . pantoprazole  40 mg Oral Daily  . ramelteon  8 mg Oral QHS  . rOPINIRole  3 mg Oral QHS  . rosuvastatin  20 mg Oral Daily  . sodium chloride flush  10-40 mL Intracatheter Q12H  . traZODone  50 mg Oral QHS   Continuous Infusions: . lactated ringers 10 mL/hr at 03/08/19 1140   PRN Meds:.acetaminophen, albuterol, hydrALAZINE, Melatonin, ondansetron (ZOFRAN) IV, polyvinyl alcohol, senna-docusate, sodium chloride flush  Assessment/Plan: Acute on chronic respiratory failure, stable Acute diastolic left heart failure, compensated Paroxysmal atrial fibrillation, CHA2DS2VASc score of 6 E. Coli urosepsis S/P left ureteral stent placement Urolithiasis CAD S/P Stent in LAD Obesity OSA Abnormal troponin I level from demand ischemia Hypokalemia, resolved Type 2 DM  Back on Eliquis. O2 saturation is fair on room air at rest Will check O2 sat on activity if drops 85 % or lower may qualify for home oxygen F/U in 1 week in office. May use 21 mg. Nicotine patch x 4 weeks then 14 mg. Patch x 4 weeks and then 7 mg. Patch x 4 weeks to quit smoking. See urology as arranged   LOS: 15 days    [name]  MD  03/09/2019, 9:37 AM

## 2019-03-09 NOTE — Progress Notes (Signed)
Orders for overnight pulse oximetry received.  Placed on patient approx. 2130.  SpO2 at that time was 95% with a HR of 67.

## 2019-03-09 NOTE — Care Management Important Message (Signed)
Important Message  Patient Details  Name: Martha Mora MRN: [id number] Date of Birth: Jan 14, 1946   Medicare Important Message Given:  Yes    Orbie Pyo 03/09/2019, 2:09 PM

## 2019-03-09 NOTE — Progress Notes (Signed)
Desats to low 80's when sleeping. o2 3L [place] given, sats went up to 90's. Continue to monitor.

## 2019-03-09 NOTE — Progress Notes (Signed)
FPTS Interim Progress Note  Spoke to patient on the phone regarding Keflex allergy. Per chart review allergy is "makes me feel funny". Patient reports Keflex makes her feel dizzy. States she told the RN this during medication administration. I discussed with patient that she had received doses of Keflex here. She states she would not want to continue this and wants to be switched off her Keflex to another abx.   Will transition patient to Bactrim as urine cx shows sensitivity to bactrim.   Caroline More, DO 03/09/2019, 12:26 PM PGY-2, [place] Medicine Service pager [phone]

## 2019-03-09 NOTE — Progress Notes (Signed)
FPTS Interim Progress Note  Spoke to patient's daughter over the phone regarding discharge planning. Daughter states patient will have 24 hr supervision as she is a housewife and will be able to watch her.   Daughter requested that patient's son Legrand Como) be called when patient is ready for discharge as he is able to pick her up. Legrand Como [phone].   Caroline More, DO 03/09/2019, 2:02 PM PGY-2, [place] Medicine Service pager [phone]

## 2019-03-09 NOTE — Progress Notes (Signed)
Pt is all set for discharge home, home rolling walker and bedside commode delivered at bedside. MD called and claimed that discharge be done  first thing in the am for she need pulse oxymetry overnight  to justify her needs for o2 inhalation  at home. Son called and made him aware, MD spoke with the pt.

## 2019-03-10 LAB — CBC
HCT: 31.8 % — ABNORMAL LOW (ref 36.0–46.0)
Hemoglobin: 9.6 g/dL — ABNORMAL LOW (ref 12.0–15.0)
MCH: 26.4 pg (ref 26.0–34.0)
MCHC: 30.2 g/dL (ref 30.0–36.0)
MCV: 87.6 fL (ref 80.0–100.0)
Platelets: 425 10*3/uL — ABNORMAL HIGH (ref 150–400)
RBC: 3.63 MIL/uL — ABNORMAL LOW (ref 3.87–5.11)
RDW: 17.4 % — ABNORMAL HIGH (ref 11.5–15.5)
WBC: 15 10*3/uL — ABNORMAL HIGH (ref 4.0–10.5)
nRBC: 0 % (ref 0.0–0.2)

## 2019-03-10 MED ORDER — APIXABAN 5 MG PO TABS: 5.0000 mg | ORAL_TABLET | Freq: Two times a day (BID) | ORAL | 0 refills | Status: DC

## 2019-03-10 MED ORDER — FUROSEMIDE 40 MG PO TABS: 40.0000 mg | ORAL_TABLET | Freq: Every day | ORAL | 0 refills | Status: DC

## 2019-03-10 MED ORDER — APIXABAN 5 MG PO TABS
5.0000 mg | ORAL_TABLET | Freq: Two times a day (BID) | ORAL | Status: DC
  Administered 2019-03-10: 5 mg via ORAL
  Filled 2019-03-10: qty 1

## 2019-03-10 MED ORDER — SULFAMETHOXAZOLE-TRIMETHOPRIM 800-160 MG PO TABS: 1.0000 | ORAL_TABLET | Freq: Two times a day (BID) | ORAL | 0 refills | Status: AC

## 2019-03-10 MED ORDER — AMIODARONE HCL 200 MG PO TABS: 200.0000 mg | ORAL_TABLET | Freq: Two times a day (BID) | ORAL | 0 refills | Status: DC

## 2019-03-10 MED ORDER — TRIMETHOPRIM 100 MG PO TABS: 100.0000 mg | ORAL_TABLET | Freq: Every day | ORAL | 1 refills | Status: DC

## 2019-03-10 MED ORDER — NICOTINE 21 MG/24HR TD PT24: 21.0000 mg | MEDICATED_PATCH | Freq: Every day | TRANSDERMAL | 0 refills | Status: DC

## 2019-03-10 NOTE — Progress Notes (Signed)
Physical Therapy Treatment Patient Details Name: Martha Mora MRN: [id number] DOB: 01/22/1946 Today's Date: 03/10/2019    History of Present Illness Pt adm with SOB, fever, and foul smelling urine. Pt found to have E coli UTI with septic shock and PNA. COVID 19 Ruled out. Pt intubated 4/6 and extubated 4/15. Pt also developed afib with RVR. PMH - copd, breast CA, CAD, MI, OA    PT Comments    Session focused on stair training today for safe home entry. Pt able to demonstrate to safety on stairs with contact guard that family will be present to provide.   Follow Up Recommendations  Home health PT;Supervision/Assistance - 24 hour     Equipment Recommendations  None recommended by PT    Recommendations for Other Services       Precautions / Restrictions Precautions Precautions: Fall Precaution Comments: watch O2 Restrictions Weight Bearing Restrictions: No    Mobility  Bed Mobility Overal bed mobility: Needs Assistance Bed Mobility: Supine to Sit;Sit to Supine     Supine to sit: Min assist Sit to supine: Min assist   General bed mobility comments: min assist for trunk elevation and LE management to and from EOB. Verbal cuing for hand placement, use of bedrails to assist in coming to EOB. PT/OT slid pt up in bed with use of bed pad.  Transfers Overall transfer level: Needs assistance Equipment used: Rolling walker (2 wheeled) Transfers: Sit to/from Stand   Stand pivot transfers: Min guard          Ambulation/Gait Ambulation/Gait assistance: Min guard   Assistive device: Rolling walker (2 wheeled) Gait Pattern/deviations: Step-through pattern;Decreased stride length;Trunk flexed Gait velocity: decreased   General Gait Details: min guard for safety, poor strength and balance    Stairs Stairs: Yes Stairs assistance: Min guard Stair Management: One rail Right;One rail Left Number of Stairs: 6 General stair comments: ambulating stiars with guard will have  family at home entry today, altnernating pattern   Wheelchair Mobility    Modified Rankin (Stroke Patients Only)       Balance Overall balance assessment: Needs assistance Sitting-balance support: No upper extremity supported;Feet supported Sitting balance-Leahy Scale: Fair       Standing balance-Leahy Scale: Poor Standing balance comment: reliant on RW for steadying.                             Cognition Arousal/Alertness: Awake/alert Behavior During Therapy: WFL for tasks assessed/performed Overall Cognitive Status: Impaired/Different from baseline Area of Impairment: Problem solving;Safety/judgement                         Safety/Judgement: Decreased awareness of safety   Problem Solving: Requires verbal cues General Comments: Pt with better direction following today, set up expectations and Pt is less impulsive.      Exercises      General Comments        Pertinent Vitals/Pain Pain Assessment: No/denies pain Faces Pain Scale: Hurts a little bit Pain Location: stomach Pain Intervention(s): Monitored during session    Home Living                      Prior Function            PT Goals (current goals can now be found in the care plan section) Acute Rehab PT Goals Patient Stated Goal: go home PT Goal Formulation: With patient/family Time  For Goal Achievement: 03/18/19 Potential to Achieve Goals: Good    Frequency    Min 3X/week      PT Plan Current plan remains appropriate    Co-evaluation              AM-PAC PT "6 Clicks" Mobility   Outcome Measure  Help needed turning from your back to your side while in a flat bed without using bedrails?: None Help needed moving from lying on your back to sitting on the side of a flat bed without using bedrails?: A Little Help needed moving to and from a bed to a chair (including a wheelchair)?: A Little Help needed standing up from a chair using your arms (e.g.,  wheelchair or bedside chair)?: A Little Help needed to walk in hospital room?: A Little Help needed climbing 3-5 steps with a railing? : A Lot 6 Click Score: 18    End of Session Equipment Utilized During Treatment: Oxygen;Gait belt Activity Tolerance: Patient limited by fatigue Patient left: in bed;with bed alarm set;with call bell/phone within reach Nurse Communication: Mobility status PT Visit Diagnosis: Unsteadiness on feet (R26.81);Other abnormalities of gait and mobility (R26.89);Muscle weakness (generalized) (M62.81)     Time: 1010-1025 PT Time Calculation (min) (ACUTE ONLY): 15 min  Charges:  $Gait Training: 8-22 mins                     Reinaldo Berber, PT, DPT Acute Rehabilitation Services Pager: [phone] Office: [phone]     Reinaldo Berber 03/10/2019, 10:24 AM

## 2019-03-10 NOTE — Consult Note (Signed)
Ref: [name], DO   Subjective:  Sitting up. Afebrile. Mild respiratory distress continues. She is ready to go home and waiting for ride.  She has need to take oxygen at night time. Mild cough continues.  She appears ready to give up smoking and vaping. She also understood to decease oral intake to avoid gaining weight.  Objective:  Vital Signs in the last 24 hours: Temp:  [98 F (36.7 C)-98.7 F (37.1 C)] 98.1 F (36.7 C) (04/22 0740) Pulse Rate:  [65-117] 72 (04/22 1435) Cardiac Rhythm: Normal sinus rhythm (04/22 0400) Resp:  [12-47] 20 (04/22 0740) BP: (133-155)/(61-72) 149/63 (04/22 0740) SpO2:  [84 %-98 %] 94 % (04/22 1435) Weight:  [94.4 kg] 94.4 kg (04/22 0500)  Physical Exam: BP Readings from Last 1 Encounters:  03/10/19 (!) 149/63     Wt Readings from Last 1 Encounters:  03/10/19 94.4 kg    Weight change: -1.1 kg Body mass index is 42.03 kg/m. HEENT: [place]/AT, Eyes-Brown, PERL, EOMI, Conjunctiva-Pale, Sclera-Non-icteric Neck: No JVD, No bruit, Trachea midline. Lungs:  Clearing, Bilateral. Cardiac:  Regular rhythm, normal S1 and S2, no S3. II/VI systolic murmur. Abdomen:  Soft, non-tender. BS present. Extremities:  No edema present. No cyanosis. No clubbing. CNS: AxOx3, Cranial nerves grossly intact, moves all 4 extremities.  Skin: Warm and dry.   Intake/Output from previous day: No intake/output data recorded.    Lab Results: BMET    Component Value Date/Time   NA 140 03/09/2019 0527   NA 140 03/08/2019 0426   NA 141 03/07/2019 0530   NA 141 08/08/2017 1009   NA 140 02/05/2017 0941   NA 142 08/05/2016 1022   K 3.7 03/09/2019 0527   K 3.6 03/08/2019 0426   K 3.1 (L) 03/07/2019 0530   K 4.0 08/08/2017 1009   K 4.1 02/05/2017 0941   K 4.1 08/05/2016 1022   CL 103 03/09/2019 0527   CL 103 03/08/2019 0426   CL 100 03/07/2019 0530   CO2 25 03/09/2019 0527   CO2 29 03/08/2019 0426   CO2 32 03/07/2019 0530   CO2 25 08/08/2017 1009   CO2 23  02/05/2017 0941   CO2 25 08/05/2016 1022   GLUCOSE 143 (H) 03/09/2019 0527   GLUCOSE 118 (H) 03/08/2019 0426   GLUCOSE 132 (H) 03/07/2019 0530   GLUCOSE 95 08/08/2017 1009   GLUCOSE 120 02/05/2017 0941   GLUCOSE 107 08/05/2016 1022   BUN 14 03/09/2019 0527   BUN 14 03/08/2019 0426   BUN 14 03/07/2019 0530   BUN 21.7 08/08/2017 1009   BUN 14.4 02/05/2017 0941   BUN 12.0 08/05/2016 1022   CREATININE 0.96 03/09/2019 0527   CREATININE 0.84 03/08/2019 0426   CREATININE 0.86 03/07/2019 0530   CREATININE 0.8 08/08/2017 1009   CREATININE 0.8 02/05/2017 0941   CREATININE 0.8 08/05/2016 1022   CALCIUM 9.1 03/09/2019 0527   CALCIUM 8.9 03/08/2019 0426   CALCIUM 8.9 03/07/2019 0530   CALCIUM 9.0 08/08/2017 1009   CALCIUM 9.4 02/05/2017 0941   CALCIUM 8.8 08/05/2016 1022   GFRNONAA 58 (L) 03/09/2019 0527   GFRNONAA >60 03/08/2019 0426   GFRNONAA >60 03/07/2019 0530   GFRNONAA 82 10/23/2015 1158   GFRAA >60 03/09/2019 0527   GFRAA >60 03/08/2019 0426   GFRAA >60 03/07/2019 0530   GFRAA >89 10/23/2015 1158   CBC    Component Value Date/Time   WBC 15.0 (H) 03/10/2019 0253   RBC 3.63 (L) 03/10/2019 0253   HGB 9.6 (  L) 03/10/2019 0253   HGB 12.7 12/10/2018 0844   HGB 12.8 08/08/2017 1009   HCT 31.8 (L) 03/10/2019 0253   HCT 39.8 12/10/2018 0844   HCT 39.8 08/08/2017 1009   PLT 425 (H) 03/10/2019 0253   PLT 297 12/10/2018 0844   MCV 87.6 03/10/2019 0253   MCV 83 12/10/2018 0844   MCV 93.9 08/08/2017 1009   MCH 26.4 03/10/2019 0253   MCHC 30.2 03/10/2019 0253   RDW 17.4 (H) 03/10/2019 0253   RDW 14.9 12/10/2018 0844   RDW 14.8 (H) 08/08/2017 1009   LYMPHSABS 2.4 03/06/2019 0545   LYMPHSABS 3.2 08/08/2017 1009   MONOABS 1.2 (H) 03/06/2019 0545   MONOABS 1.2 (H) 08/08/2017 1009   EOSABS 0.7 (H) 03/06/2019 0545   EOSABS 0.1 08/08/2017 1009   BASOSABS 0.1 03/06/2019 0545   BASOSABS 0.1 08/08/2017 1009   HEPATIC Function Panel Recent Labs    02/27/19 0236 02/28/19 0500  03/01/19 0500  PROT 5.9* 5.6* 5.4*   HEMOGLOBIN A1C No components found for: HGA1C,  MPG CARDIAC ENZYMES Lab Results  Component Value Date   TROPONINI 1.82 (HH) 03/04/2019   TROPONINI 2.18 (HH) 03/03/2019   TROPONINI 1.83 (HH) 03/03/2019   BNP No results for input(s): PROBNP in the last 8760 hours. TSH Recent Labs    10/06/18 1051  TSH 1.450   CHOLESTEROL Recent Labs    10/06/18 1051  CHOL 153    Scheduled Meds: . amiodarone  200 mg Oral BID  . apixaban  5 mg Oral BID  . Chlorhexidine Gluconate Cloth  6 each Topical Daily  . citalopram  40 mg Oral Daily  . feeding supplement (ENSURE ENLIVE)  237 mL Oral Q24H  . furosemide  40 mg Oral Daily  . losartan  50 mg Oral Daily  . mouth rinse  15 mL Mouth Rinse BID  . multivitamin with minerals  1 tablet Oral Daily  . nicotine  21 mg Transdermal Daily  . pantoprazole  40 mg Oral Daily  . ramelteon  8 mg Oral QHS  . rOPINIRole  3 mg Oral QHS  . rosuvastatin  20 mg Oral Daily  . sodium chloride flush  10-40 mL Intracatheter Q12H  . sulfamethoxazole-trimethoprim  1 tablet Oral Q12H  . traZODone  50 mg Oral QHS   Continuous Infusions: . lactated ringers 10 mL/hr at 03/08/19 1140   PRN Meds:.acetaminophen, albuterol, hydrALAZINE, Melatonin, ondansetron (ZOFRAN) IV, polyvinyl alcohol, senna-docusate, sodium chloride flush  Assessment/Plan: Acute on chronic respiratory failure with hypoxemia Acute diastolic left heart failure, HFpEF Paroxysmal atrial fibrillation, CHA2DS2VASc score of 6 E.Coli urosepsis S/P left ureteral stent Urolithiasis CAD Obesity OSA Abnormal troponin I level from demand ischemia  Continue medical treatment. F/U in 1 week.   LOS: 16 days    [name]  MD  03/10/2019, 2:57 PM

## 2019-03-10 NOTE — TOC Progression Note (Addendum)
Transition of Care [facility]) - Progression Note    Patient Details  Name: MONTEEN TOOPS MRN: [id number] Date of Birth: [birth year]  [facility] CM/SW Contact  Maryclare Labrador, RN Phone Number: 03/10/2019, 8:29 AM  Clinical Narrative:   Overnight oximetry test was ordered and results are in - cm informed [facility].  Update: 0900  [facility] requesting flow sheet data from overnight study - note from respiratory is insufficient.  Bedside nurse confirms that equipment was on pt overnight and retrieved by resp this am.  Attending to review results from overnight and contact [employer] directly with information needed.  1400:  [facility] has accepted oxygen referral and will provide nocturnal oxygen in the home today.  Expected Discharge Plan: [place] Barriers to Discharge: Continued Medical Work up  Expected Discharge Plan and Services Expected Discharge Plan: [place]   Discharge Planning Services: CM Consult Post Acute Care Choice: [place] arrangements for the past 2 months: Single Family Home Expected Discharge Date: 03/10/19               DME Arranged: Gilford Rile rolling DME Agency: [facility] HH Arranged: PT, OT HH Agency: [place]   Social Determinants of Health (SDOH) Interventions    Readmission Risk Interventions No flowsheet data found.

## 2019-03-10 NOTE — Progress Notes (Signed)
Occupational Therapy Treatment Patient Details Name: Martha Mora MRN: [id number] DOB: 28-Mar-1946 Today's Date: 03/10/2019    History of present illness Pt adm with SOB, fever, and foul smelling urine. Pt found to have E coli UTI with septic shock and PNA. COVID 19 Ruled out. Pt intubated 4/6 and extubated 4/15. Pt also developed afib with RVR. PMH - copd, breast CA, CAD, MI, OA   OT comments  Pt progressing towards OT goals, able to ambulate to sink, perform standing grooming with 1 seated rest break. Pt educated on pursed lip breathing, placed on 2L O2 at end of session as O2 was at 89%. HR telemetry leads would not stay on during session. RN notified, and since Pt is discharging, removed. Pt continues to require HHOT.    Follow Up Recommendations  Home health OT;Supervision/Assistance - 24 hour    Equipment Recommendations  3 in 1 bedside commode    Recommendations for Other Services      Precautions / Restrictions Precautions Precautions: Fall Precaution Comments: watch O2 Restrictions Weight Bearing Restrictions: No       Mobility Bed Mobility Overal bed mobility: Needs Assistance Bed Mobility: Supine to Sit;Sit to Supine     Supine to sit: Supervision Sit to supine: Supervision   General bed mobility comments: supervision, initially when OT came in Pt was supine, head at the foot of the bed  Transfers Overall transfer level: Needs assistance Equipment used: Rolling walker (2 wheeled) Transfers: Sit to/from Stand   Stand pivot transfers: Min guard            Balance Overall balance assessment: Needs assistance Sitting-balance support: No upper extremity supported;Feet supported Sitting balance-Leahy Scale: Fair       Standing balance-Leahy Scale: Poor Standing balance comment: reliant on RW for steadying.                            ADL either performed or assessed with clinical judgement   ADL Overall ADL's : Needs assistance/impaired      Grooming: Wash/dry hands;Wash/dry face;Oral care;Min guard;Standing Grooming Details (indicate cue type and reason): seated resting break x1                 Toilet Transfer: Supervision/safety;RW             General ADL Comments: fatigues quickly     Vision       Perception     Praxis      Cognition Arousal/Alertness: Awake/alert Behavior During Therapy: WFL for tasks assessed/performed Overall Cognitive Status: Impaired/Different from baseline Area of Impairment: Problem solving;Safety/judgement                         Safety/Judgement: Decreased awareness of safety   Problem Solving: Requires verbal cues General Comments: Pt with better direction following today, set up expectations and Pt is less impulsive.        Exercises     Shoulder Instructions       General Comments      Pertinent Vitals/ Pain       Pain Assessment: No/denies pain Pain Intervention(s): Monitored during session  Home Living                                          Prior Functioning/Environment  Frequency  Min 3X/week        Progress Toward Goals  OT Goals(current goals can now be found in the care plan section)  Progress towards OT goals: Progressing toward goals  Acute Rehab OT Goals Patient Stated Goal: go home, get my hair taken care of OT Goal Formulation: With patient Time For Goal Achievement: 03/18/19 Potential to Achieve Goals: Good  Plan Discharge plan remains appropriate;Frequency remains appropriate    Co-evaluation                 AM-PAC OT "6 Clicks" Daily Activity     Outcome Measure   Help from another person eating meals?: None Help from another person taking care of personal grooming?: A Little Help from another person toileting, which includes using toliet, bedpan, or urinal?: A Little Help from another person bathing (including washing, rinsing, drying)?: A Little Help from another  person to put on and taking off regular upper body clothing?: A Little Help from another person to put on and taking off regular lower body clothing?: A Lot 6 Click Score: 18    End of Session Equipment Utilized During Treatment: Rolling walker;Oxygen(2L)  OT Visit Diagnosis: Muscle weakness (generalized) (M62.81)   Activity Tolerance Patient tolerated treatment well   Patient Left in bed;with call bell/phone within reach   Nurse Communication Mobility status(leads kept coming off, disconnected from monitor)        Time: 9244-6286 OT Time Calculation (min): 19 min  Charges: OT General Charges $OT Visit: 1 Visit OT Treatments $Self Care/Home Management : 8-22 mins  Hulda Humphrey OTR/L Acute Rehabilitation Services Pager: [phone] Office: [place] 03/10/2019, 10:22 AM

## 2019-03-10 NOTE — Progress Notes (Signed)
Patient placed on overnight pulse oximetry monitoring by RT per MD order. Patient was left on 3L O2 via nasal cannula and remains at 93% SpO2 with HR of 62.

## 2019-03-10 NOTE — Progress Notes (Signed)
Discharge instructions reviewed with patient including when to take medications again, what the new medications do and what pharmacy to get the new prescriptions from.  Upcoming appointments were reviewed.  Patient was able to teach back the information discussed and showed discharging nurse where to find the dates of upcoming appointments.  Upon conclusion, Dr Raliegh Ip arrived to see patient

## 2019-03-10 NOTE — Progress Notes (Signed)
Due to departmental directions, we are not completing Advance Directives at this time.  Chaplain Dr Felishia Wartman 

## 2019-03-10 NOTE — Progress Notes (Signed)
Via wheelchair to main entrance to meet daughter.  Instructions given to daughter that if they do not hear from [place] by 6pm regarding oxygen delivery to call Case manager Samantha.  Her number was given to them.  They both verbalized understanding.  Patient was assisted into vehicle and seat belt buckled

## 2019-03-10 NOTE — Consult Note (Signed)
   [facility] Inpatient Consult   03/10/2019  Martha Mora 09/30/1946 [id number]    Follow-up note:  Patient followed for disposition and for community based services needed with [facility] as a benefit of patient's [employer].  Per transition of care CM note, discharge disposition is home with home health services per [place], [facility] for DME. Patient currently requiring oxygen use.  As previously discussed with patient's daughter [name]), referral made and patientwill receive EMMI Pneumonia calls to closely follow-up her recovery postdischarge and will be evaluated for complex disease care management (COPD). Patient also noted to have acute diastolic congestive HF (compensated) on this admission, had developed acute atrial fibrillation and was started on Eliquis.   For questions and additional information, please call:  Kenrick Pore A. Corrin Hingle, BSN, RN-BC [facility] Liaison Cell: [phone]

## 2019-03-10 NOTE — Progress Notes (Signed)
Overnight oxymetry performed from 0100 to 0546. Patient at  [place] to 0220 desatted to 84% on room air.

## 2019-03-10 NOTE — Progress Notes (Signed)
Patient placed on room air at 0154. Patient's SpO2 was at 91% and HR was 71. Patient remains on continuous pulse oximetry.  Patient's SpO2 sats decreased to 84% on room air at 0233. Patient became symptomatic at this time with increased work of breathing, increased respiration rate (20-30), and runs of PVCs. Patient placed on 1L via nasal cannula at 0234. Patient's SpO2 sats increased to 90% by 0244 with HR at 62 showing NSR.  Patient remains above 90% on 1L via nasal cannula at this time with no signs of distress.  Will continue to monitor patient's status throughout the evening.

## 2019-03-11 DIAGNOSIS — Z792 Long term (current) use of antibiotics: Secondary | ICD-10-CM | POA: Diagnosis not present

## 2019-03-11 DIAGNOSIS — Z7901 Long term (current) use of anticoagulants: Secondary | ICD-10-CM | POA: Diagnosis not present

## 2019-03-11 DIAGNOSIS — I5031 Acute diastolic (congestive) heart failure: Secondary | ICD-10-CM | POA: Diagnosis not present

## 2019-03-11 DIAGNOSIS — I2510 Atherosclerotic heart disease of native coronary artery without angina pectoris: Secondary | ICD-10-CM | POA: Diagnosis not present

## 2019-03-11 DIAGNOSIS — Z960 Presence of urogenital implants: Secondary | ICD-10-CM | POA: Diagnosis not present

## 2019-03-11 DIAGNOSIS — A4151 Sepsis due to Escherichia coli [E. coli]: Secondary | ICD-10-CM | POA: Diagnosis not present

## 2019-03-11 DIAGNOSIS — I252 Old myocardial infarction: Secondary | ICD-10-CM | POA: Diagnosis not present

## 2019-03-11 DIAGNOSIS — N136 Pyonephrosis: Secondary | ICD-10-CM | POA: Diagnosis not present

## 2019-03-11 DIAGNOSIS — J449 Chronic obstructive pulmonary disease, unspecified: Secondary | ICD-10-CM | POA: Diagnosis not present

## 2019-03-11 DIAGNOSIS — M479 Spondylosis, unspecified: Secondary | ICD-10-CM | POA: Diagnosis not present

## 2019-03-11 DIAGNOSIS — M810 Age-related osteoporosis without current pathological fracture: Secondary | ICD-10-CM | POA: Diagnosis not present

## 2019-03-11 DIAGNOSIS — I110 Hypertensive heart disease with heart failure: Secondary | ICD-10-CM | POA: Diagnosis not present

## 2019-03-11 DIAGNOSIS — M160 Bilateral primary osteoarthritis of hip: Secondary | ICD-10-CM | POA: Diagnosis not present

## 2019-03-11 DIAGNOSIS — G4730 Sleep apnea, unspecified: Secondary | ICD-10-CM | POA: Diagnosis not present

## 2019-03-11 DIAGNOSIS — Z853 Personal history of malignant neoplasm of breast: Secondary | ICD-10-CM | POA: Diagnosis not present

## 2019-03-11 DIAGNOSIS — I4891 Unspecified atrial fibrillation: Secondary | ICD-10-CM | POA: Diagnosis not present

## 2019-03-11 DIAGNOSIS — Z95828 Presence of other vascular implants and grafts: Secondary | ICD-10-CM | POA: Diagnosis not present

## 2019-03-12 ENCOUNTER — Other Ambulatory Visit: Payer: Self-pay | Admitting: *Deleted

## 2019-03-12 DIAGNOSIS — Z7901 Long term (current) use of anticoagulants: Secondary | ICD-10-CM | POA: Diagnosis not present

## 2019-03-12 DIAGNOSIS — J449 Chronic obstructive pulmonary disease, unspecified: Secondary | ICD-10-CM | POA: Diagnosis not present

## 2019-03-12 DIAGNOSIS — Z792 Long term (current) use of antibiotics: Secondary | ICD-10-CM | POA: Diagnosis not present

## 2019-03-12 DIAGNOSIS — G4730 Sleep apnea, unspecified: Secondary | ICD-10-CM | POA: Diagnosis not present

## 2019-03-12 DIAGNOSIS — I110 Hypertensive heart disease with heart failure: Secondary | ICD-10-CM | POA: Diagnosis not present

## 2019-03-12 DIAGNOSIS — Z960 Presence of urogenital implants: Secondary | ICD-10-CM | POA: Diagnosis not present

## 2019-03-12 DIAGNOSIS — M810 Age-related osteoporosis without current pathological fracture: Secondary | ICD-10-CM | POA: Diagnosis not present

## 2019-03-12 DIAGNOSIS — I5031 Acute diastolic (congestive) heart failure: Secondary | ICD-10-CM | POA: Diagnosis not present

## 2019-03-12 DIAGNOSIS — Z853 Personal history of malignant neoplasm of breast: Secondary | ICD-10-CM | POA: Diagnosis not present

## 2019-03-12 DIAGNOSIS — M160 Bilateral primary osteoarthritis of hip: Secondary | ICD-10-CM | POA: Diagnosis not present

## 2019-03-12 DIAGNOSIS — Z95828 Presence of other vascular implants and grafts: Secondary | ICD-10-CM | POA: Diagnosis not present

## 2019-03-12 DIAGNOSIS — M479 Spondylosis, unspecified: Secondary | ICD-10-CM | POA: Diagnosis not present

## 2019-03-12 DIAGNOSIS — A4151 Sepsis due to Escherichia coli [E. coli]: Secondary | ICD-10-CM | POA: Diagnosis not present

## 2019-03-12 DIAGNOSIS — I252 Old myocardial infarction: Secondary | ICD-10-CM | POA: Diagnosis not present

## 2019-03-12 DIAGNOSIS — N136 Pyonephrosis: Secondary | ICD-10-CM | POA: Diagnosis not present

## 2019-03-12 DIAGNOSIS — I2510 Atherosclerotic heart disease of native coronary artery without angina pectoris: Secondary | ICD-10-CM | POA: Diagnosis not present

## 2019-03-12 DIAGNOSIS — I4891 Unspecified atrial fibrillation: Secondary | ICD-10-CM | POA: Diagnosis not present

## 2019-03-12 NOTE — Patient Outreach (Signed)
[place] [facility]) Care Management  03/12/2019  Martha Mora October 30, 1946 [id number]   Referral received from hospital liaison as member was recently admitted for community acquired pneumonia and required intubation and ICU stay.  She was discharged on 4/22, primary MD office will complete transition of care assessment.  Per chart, other medical conditions include hypertension, GERD, COPD, hyperlipidemia, and UTI.    Call placed to member's listed number, no answer.  HIPAA compliant voice message left.  Call also placed to member's daughter, [name], no answer.  HIPAA compliant voice message left.  Will await call back.  Unsuccessful outreach letter sent, will follow up within the next 3-4 business days.  Valente David, [place], MSN [place] [phone]

## 2019-03-15 ENCOUNTER — Other Ambulatory Visit: Payer: Self-pay

## 2019-03-15 ENCOUNTER — Telehealth: Payer: Self-pay | Admitting: Family Medicine

## 2019-03-15 ENCOUNTER — Telehealth (INDEPENDENT_AMBULATORY_CARE_PROVIDER_SITE_OTHER): Payer: Medicare Other | Admitting: Family Medicine

## 2019-03-15 ENCOUNTER — Other Ambulatory Visit: Payer: Self-pay | Admitting: Family Medicine

## 2019-03-15 DIAGNOSIS — N200 Calculus of kidney: Secondary | ICD-10-CM | POA: Diagnosis not present

## 2019-03-15 DIAGNOSIS — Z09 Encounter for follow-up examination after completed treatment for conditions other than malignant neoplasm: Secondary | ICD-10-CM | POA: Insufficient documentation

## 2019-03-15 NOTE — Assessment & Plan Note (Signed)
Patient is yet to make her follow-up appointment for uterus copy with urology.  Patient continuing to take her antibiotics.

## 2019-03-15 NOTE — Telephone Encounter (Signed)
Martha Mora with [facility] calling for verbal orders for home health. Frequency of: 2 x's for 6 weeks 1 x for 2 weeks  to work on [employer] and strengthening.   She also wanted to let [name] know of some medications interactions: apixaban with diclofenac  amiodarone and trazodone amiodarone and citalopram  Losartan and trimethoprim (start date may 6th) Losartan and sulfamethoxazole  citalopram and omeprazole   Martha Mora PT with [facility]: call back at  [phone]

## 2019-03-15 NOTE — Assessment & Plan Note (Signed)
Patient has several new complaints since being discharged including: Nausea, vomiting, early satiety, anesthesia of the right anterior thigh, decreased hearing, vertigo, insomnia.  Told patient that this would be better addressed with an inpatient visit in which we could give her a physical exam.  Patient agreed to and in person visit on Monday, May 4.  Patient is to schedule follow-up appointment with cardiology and neurology today.  Most likely these new symptoms are a combination of anxiety and medication changes, but several of these medications she has been started on are necessary and are being managed by cardiology.  Did advise patient to stop any medications now until we can see her in person.  Uncertain based on discharge summary how long patient is to be on Lasix.  She not currently having any respiratory symptoms.  This may also be the cause of her change in hearing.  She is also currently taking oxybutynin, for her urinary incontinence.  Unsure if she should be on both these medications at the same time, but will decide which medicine to discontinue if any after a physical exam has been performed on her next visit.

## 2019-03-15 NOTE — Progress Notes (Signed)
[place] Telemedicine Visit  Patient consented to have virtual visit. Method of visit: Telephone.  Does not have phone capable of performing video call.  Encounter participants: Patient: Martha Mora - located at home Provider: Benay Pike - located at [facility] Others (if applicable): Daughter  Chief Complaint: Hospital follow-up for urosepsis  HPI: Patient states she has been feeling extremely weak with nausea and persistent gagging.  She vomits twice a week she says.  She states she gets full easily.  She says she has 2 peanut butter crackers this morning with some orange juice and was full, which is not like her normal self  Patient says she has had worsening insomnia.  She says she wakes up after 3 hours and cannot go back to sleep.  She states this is new for her.  Patient has decreased hearing in both of her ears.  She states this is new for her since discharge.  She also says she has vertigo about 3 times a week that resolves within a minute.  She has had tinnitus for several years.  This has not changed.  Patient has stated that the anterior part of her right thigh has become numb since her discharge.  She has not been wearing any tight clothing during this time.  She says the numbness lasts all day.  Patient is going to call the cardiologist and urologist to schedule follow-up appointments after our discussion today.  Patient is still taking her Bactrim.  She is taking her amiodarone once a day and Eliquis once a day.  Patient daughter says the patient has high levels of anxiety at baseline, for which she is taking Celexa currently.  Patient's daughter wonders if patient could be having PTSD from the time she was intubated, as she repeatedly tried to extubate herself during this time.  Patient does not currently have a therapist.  Patient daughter was wondering if patient can get a less restrictive CPAP mask.  Her last sleep study was 3 years ago.  She is  currently using the full mouth mask.  Patient would like to have all of her medicines prescribed by [facility], as she gets her medicines for free through this.   ROS: per HPI  Pertinent PMHx: Anxiety, A. fib, kidney stones  Exam:  Respiratory: Patient not coughing during encounter.  Patient is able to speak full sentences without difficulty.  Assessment/Plan:  Renal stones Patient is yet to make her follow-up appointment for uterus copy with urology.  Patient continuing to take her antibiotics.  Hospital discharge follow-up Patient has several new complaints since being discharged including: Nausea, vomiting, early satiety, anesthesia of the right anterior thigh, decreased hearing, vertigo, insomnia.  Told patient that this would be better addressed with an inpatient visit in which we could give her a physical exam.  Patient agreed to and in person visit on Monday, May 4.  Patient is to schedule follow-up appointment with cardiology and neurology today.  Most likely these new symptoms are a combination of anxiety and medication changes, but several of these medications she has been started on are necessary and are being managed by cardiology.  Did advise patient to stop any medications now until we can see her in person.  Uncertain based on discharge summary how long patient is to be on Lasix.  She not currently having any respiratory symptoms.  This may also be the cause of her change in hearing.  She is also currently taking oxybutynin,  for her urinary incontinence.  Unsure if she should be on both these medications at the same time, but will decide which medicine to discontinue if any after a physical exam has been performed on her next visit.    Time spent during visit with patient: 30 minutes

## 2019-03-15 NOTE — Telephone Encounter (Signed)
Ok to give verbal orders. I have reviewed her medications and will make adjustments when I see her next. Thanks!

## 2019-03-15 NOTE — Telephone Encounter (Signed)
[place] informed of verbal orders. Deseree Kennon Holter, CMA

## 2019-03-16 DIAGNOSIS — Z95828 Presence of other vascular implants and grafts: Secondary | ICD-10-CM | POA: Diagnosis not present

## 2019-03-16 DIAGNOSIS — Z7901 Long term (current) use of anticoagulants: Secondary | ICD-10-CM | POA: Diagnosis not present

## 2019-03-16 DIAGNOSIS — J449 Chronic obstructive pulmonary disease, unspecified: Secondary | ICD-10-CM | POA: Diagnosis not present

## 2019-03-16 DIAGNOSIS — I110 Hypertensive heart disease with heart failure: Secondary | ICD-10-CM | POA: Diagnosis not present

## 2019-03-16 DIAGNOSIS — I2510 Atherosclerotic heart disease of native coronary artery without angina pectoris: Secondary | ICD-10-CM | POA: Diagnosis not present

## 2019-03-16 DIAGNOSIS — M160 Bilateral primary osteoarthritis of hip: Secondary | ICD-10-CM | POA: Diagnosis not present

## 2019-03-16 DIAGNOSIS — Z792 Long term (current) use of antibiotics: Secondary | ICD-10-CM | POA: Diagnosis not present

## 2019-03-16 DIAGNOSIS — N136 Pyonephrosis: Secondary | ICD-10-CM | POA: Diagnosis not present

## 2019-03-16 DIAGNOSIS — Z960 Presence of urogenital implants: Secondary | ICD-10-CM | POA: Diagnosis not present

## 2019-03-16 DIAGNOSIS — I5031 Acute diastolic (congestive) heart failure: Secondary | ICD-10-CM | POA: Diagnosis not present

## 2019-03-16 DIAGNOSIS — I4891 Unspecified atrial fibrillation: Secondary | ICD-10-CM | POA: Diagnosis not present

## 2019-03-16 DIAGNOSIS — I252 Old myocardial infarction: Secondary | ICD-10-CM | POA: Diagnosis not present

## 2019-03-16 DIAGNOSIS — M810 Age-related osteoporosis without current pathological fracture: Secondary | ICD-10-CM | POA: Diagnosis not present

## 2019-03-16 DIAGNOSIS — A4151 Sepsis due to Escherichia coli [E. coli]: Secondary | ICD-10-CM | POA: Diagnosis not present

## 2019-03-16 DIAGNOSIS — Z853 Personal history of malignant neoplasm of breast: Secondary | ICD-10-CM | POA: Diagnosis not present

## 2019-03-16 DIAGNOSIS — G4730 Sleep apnea, unspecified: Secondary | ICD-10-CM | POA: Diagnosis not present

## 2019-03-16 DIAGNOSIS — M479 Spondylosis, unspecified: Secondary | ICD-10-CM | POA: Diagnosis not present

## 2019-03-17 ENCOUNTER — Other Ambulatory Visit: Payer: Self-pay | Admitting: *Deleted

## 2019-03-17 ENCOUNTER — Other Ambulatory Visit: Payer: Self-pay | Admitting: Family Medicine

## 2019-03-17 ENCOUNTER — Encounter: Payer: Self-pay | Admitting: *Deleted

## 2019-03-17 NOTE — Patient Outreach (Signed)
[place] [facility]) Care Management  03/17/2019  Martha Mora 1946/03/08 [id number]   Second attempt made to contact member/daughter for telephone assessment post hospital discharge.  Spoke with daughter, Martha Mora identity verified.  This care manager introduced self and stated purpose of call.  [facility] care management services explained.  She report member is doing a little better over the last few days.  State she was having a hard time with decreased appetite and nausea/vomiting but that has now subsided and her appetite has increased.  State she feel it may have been a side effect of the antibiotics.    Report she still has blood in her urine, but also aware that member still has kidney stones.  They have appointment with kidney specialist tomorrow morning, will see NP.  She will see MD on 5/13.  Denies member has had pain or fever.  Member had follow up with primary MD over telephone on 4/27, will have face to face visit on 5/4.    State member has had trouble sleeping, unable to wear her CPAP through the night because she wake up on a panic.  Daughter concerned member may be experiencing PTSD from her time intubated in the ICU.  She also has history of anxiety.  Agrees to have social worker reach out for resources, also will discuss advanced life planning as member does not have a POA.    Member usually is independent, lives alone but currently living with daughter.  She is unsure if member will go back to her home as daughter would like for her to remain living with them.  She is progressing with the help of home health PT.  They deny any urgent concerns at this time, advised to contact this care manager with questions.    Will place referral to pharmacy for medication review (greater then 10 meds) and Education officer, museum.  Will follow up with member/daughter within the next 2 weeks.  THN CM Care Plan Problem One     Most Recent Value  Care Plan Problem One  Risk for readmission  related to kidney injury and pneumonia as evidenced by recent hospitalization  Role Documenting the Problem One  Care Management [place] for Problem One  Active  THN Long Term Goal   Member will not be readmitted to hospital within the next 45 days  THN Long Term Goal Start Date  03/17/19  Interventions for Problem One Long Term Goal  Discharge instructions reviewed with member's daughter.  Educated on importance of following plan of care in effort to decrease risk of readmission  THN CM Short Term Goal #1   Member will report decrease in anxiety and ability to wear CPAP through the night within the next 2 weeks  THN CM Short Term Goal #1 Start Date  03/17/19  Interventions for Short Term Goal #1  Referral to CSW placed to help with resources for anxiety management.  Daughter advised on importance of decreasing anxiety levels in effort to continue to promote healing and recovery  THN CM Short Term Goal #2   Member/daughter will report medication compliance as instructed within the next 3 weeks  THN CM Short Term Goal #2 Start Date  03/17/19  Interventions for Short Term Goal #2  Medications reviewed with daughter.  Referral placed to [facility] pharmacist for review as she has greater than 10 medications  THN CM Short Term Goal #3  Member will keep and attend follow up appointments with kidney specialist  and PCP within the next 2 weeks  THN CM Short Term Goal #3 Start Date  03/17/19  Interventions for Short Tern Goal #3  Upcoming appointments reviewed with daughter, assessed the need for transportation assistance     Valente David, Therapist, sports, MSN [place] Manager [phone]

## 2019-03-18 ENCOUNTER — Other Ambulatory Visit: Payer: Self-pay | Admitting: *Deleted

## 2019-03-18 DIAGNOSIS — N201 Calculus of ureter: Secondary | ICD-10-CM | POA: Diagnosis not present

## 2019-03-18 NOTE — Patient Outreach (Signed)
[place] [facility]) Care Management  03/18/2019  SHERRY BLACKARD 1946-07-18 [id number]   Notified by care management assistant that member triggered red on EMMI dashboard 4/29 for feeling sick to her stomach and not eating on a regular basis.  This care manager made contact with member and daughter yesterday, 4/29, to discuss member's condition.  They both state her appetite is getting better, nausea has decreased.  Will not contact today, will follow up within the next 2 weeks as planned.    Valente David, [place], MSN [place] [phone]

## 2019-03-19 ENCOUNTER — Telehealth: Payer: Self-pay

## 2019-03-19 DIAGNOSIS — I4891 Unspecified atrial fibrillation: Secondary | ICD-10-CM | POA: Diagnosis not present

## 2019-03-19 DIAGNOSIS — M810 Age-related osteoporosis without current pathological fracture: Secondary | ICD-10-CM | POA: Diagnosis not present

## 2019-03-19 DIAGNOSIS — N136 Pyonephrosis: Secondary | ICD-10-CM | POA: Diagnosis not present

## 2019-03-19 DIAGNOSIS — A4151 Sepsis due to Escherichia coli [E. coli]: Secondary | ICD-10-CM | POA: Diagnosis not present

## 2019-03-19 DIAGNOSIS — Z7901 Long term (current) use of anticoagulants: Secondary | ICD-10-CM | POA: Diagnosis not present

## 2019-03-19 DIAGNOSIS — I110 Hypertensive heart disease with heart failure: Secondary | ICD-10-CM | POA: Diagnosis not present

## 2019-03-19 DIAGNOSIS — M160 Bilateral primary osteoarthritis of hip: Secondary | ICD-10-CM | POA: Diagnosis not present

## 2019-03-19 DIAGNOSIS — Z960 Presence of urogenital implants: Secondary | ICD-10-CM | POA: Diagnosis not present

## 2019-03-19 DIAGNOSIS — M479 Spondylosis, unspecified: Secondary | ICD-10-CM | POA: Diagnosis not present

## 2019-03-19 DIAGNOSIS — I5031 Acute diastolic (congestive) heart failure: Secondary | ICD-10-CM | POA: Diagnosis not present

## 2019-03-19 DIAGNOSIS — I252 Old myocardial infarction: Secondary | ICD-10-CM | POA: Diagnosis not present

## 2019-03-19 DIAGNOSIS — Z95828 Presence of other vascular implants and grafts: Secondary | ICD-10-CM | POA: Diagnosis not present

## 2019-03-19 DIAGNOSIS — J449 Chronic obstructive pulmonary disease, unspecified: Secondary | ICD-10-CM | POA: Diagnosis not present

## 2019-03-19 DIAGNOSIS — Z853 Personal history of malignant neoplasm of breast: Secondary | ICD-10-CM | POA: Diagnosis not present

## 2019-03-19 DIAGNOSIS — Z792 Long term (current) use of antibiotics: Secondary | ICD-10-CM | POA: Diagnosis not present

## 2019-03-19 DIAGNOSIS — G4730 Sleep apnea, unspecified: Secondary | ICD-10-CM | POA: Diagnosis not present

## 2019-03-19 DIAGNOSIS — I2510 Atherosclerotic heart disease of native coronary artery without angina pectoris: Secondary | ICD-10-CM | POA: Diagnosis not present

## 2019-03-19 NOTE — Telephone Encounter (Signed)
Pt calling to talk to [name]. Pt is on a heavy antibiotic that is causing diarrhea. Pt wants to take imodium AD but wants to make sure it's ok and won't interfere with medication. Ottis Stain, CMA

## 2019-03-21 NOTE — Telephone Encounter (Signed)
Will discuss at visit on 5/4.

## 2019-03-21 NOTE — Progress Notes (Deleted)
  Subjective:   Patient ID: Martha Mora    DOB: 21-Jul-1946, 73 y.o. female   MRN: [id number]  Martha Mora is a 73 y.o. female with a history of HTN, CAD, sleep apnea, GERD, IBS, diverticulosis, OA, h/o breast cancer, HLD, obesity, anxiety/depression, mixed incontinence here for   Hospital follow up - Hospitalized 4/5-4/22 for SOB and fever 2/2 urosepsis and possible CAP with COVID r/o given quick decline in resp status to intubation on the day of admission. Urosepsis 2/2 Ecoli with L UPJ renal stone, treated with IV abx and d/ced on bactrim x2 wks. Utereral stent placement 4/20. Started on lasix for fluid overload after resuscitation. - Med changes: lasix 40mg  daily, bactrim x2 wks for urosepsis (finish 5/6) followed by Trimethoprim 100mg  daily for ppx, amiodarone 243mmg BID x1 wk then 200mg  qd on 4/26. Should be on Eliquis 5mg  on 4/22. D/c amlodipine, ASA and coreg. Celexa 20mg  BID - Outpt f/u: Urology, Cardiology  New complaints - ***  Review of Systems:  Per HPI.  Martha Mora, medications and smoking status reviewed.  Objective:   There were no vitals taken for this visit. Vitals and nursing note reviewed.  General: well nourished, well developed, in no acute distress with non-toxic appearance HEENT: normocephalic, atraumatic, moist mucous membranes Neck: supple, non-tender without lymphadenopathy CV: regular rate and rhythm without murmurs, rubs, or gallops, no lower extremity edema Lungs: clear to auscultation bilaterally with normal work of breathing Abdomen: soft, non-tender, non-distended, no masses or organomegaly palpable, normoactive bowel sounds Skin: warm, dry, no rashes or lesions Extremities: warm and well perfused, normal tone MSK: ROM grossly intact, strength intact, gait normal Neuro: Alert and oriented, speech normal  Assessment & Plan:   No problem-specific Assessment & Plan notes found for this encounter.  No orders of the defined types were placed in this  encounter.  No orders of the defined types were placed in this encounter.   [name], DO PGY-2, [place] Medicine 03/21/2019 4:29 PM

## 2019-03-22 ENCOUNTER — Telehealth: Payer: Self-pay | Admitting: Pharmacist

## 2019-03-22 ENCOUNTER — Ambulatory Visit: Payer: Medicare Other | Admitting: Family Medicine

## 2019-03-22 ENCOUNTER — Ambulatory Visit: Payer: Medicare Other

## 2019-03-22 ENCOUNTER — Other Ambulatory Visit: Payer: Self-pay | Admitting: *Deleted

## 2019-03-22 NOTE — Patient Outreach (Signed)
[name]) Care Management  03/22/2019  Martha Mora 1946/05/09 [id number]   Notified by care management assistant that member triggered red on EMMI dashboard for still being sick to her stomach, having diarrhea, and not being back to her pre-sick activity level.  Call placed to member/daughter [name].  She is still staying with daughter, [name] report that member is still experiencing side effects from the antibiotics being given.  She is having diarrhea and had to cancel her visit with her primary MD today due to frequent visits to the bathroom this morning.  Daughter report member has increased her fiber and started taking probiotics in hopes of relieving diarrhea.  State she will also contact MD office to inquire about the safety of imodium will reschedule visit.  Advised to increase fluid intake in effort to decrease risk of dehydration as well.  Verbalizes understanding.  Otherwise, denies any other concerns.  Report member's activity level and strength has increased, no longer needing the walker for ambulation but instead using a cane.  Denies questions, will follow up next week as scheduled.  Valente David, [place], MSN [place] [phone]

## 2019-03-22 NOTE — Patient Outreach (Signed)
[place] [facility]) Care Management  03/22/2019  Martha Mora 03-Nov-1946 [id number]   Patient was called regarding medication review post discharge. Unfortunately, patient did not answer her phone. HIPAA compliant message was left on the voicemail.  Plan: Send patient an unsuccessful outreach letter. Call patient back in 3 business days.   Elayne Guerin, PharmD, [place] Clinical Pharmacist [phone]

## 2019-03-23 ENCOUNTER — Other Ambulatory Visit: Payer: Self-pay | Admitting: Pharmacist

## 2019-03-23 ENCOUNTER — Other Ambulatory Visit: Payer: Self-pay | Admitting: *Deleted

## 2019-03-23 DIAGNOSIS — I110 Hypertensive heart disease with heart failure: Secondary | ICD-10-CM | POA: Diagnosis not present

## 2019-03-23 DIAGNOSIS — A4151 Sepsis due to Escherichia coli [E. coli]: Secondary | ICD-10-CM | POA: Diagnosis not present

## 2019-03-23 DIAGNOSIS — J449 Chronic obstructive pulmonary disease, unspecified: Secondary | ICD-10-CM | POA: Diagnosis not present

## 2019-03-23 DIAGNOSIS — I4891 Unspecified atrial fibrillation: Secondary | ICD-10-CM | POA: Diagnosis not present

## 2019-03-23 DIAGNOSIS — Z792 Long term (current) use of antibiotics: Secondary | ICD-10-CM | POA: Diagnosis not present

## 2019-03-23 DIAGNOSIS — Z960 Presence of urogenital implants: Secondary | ICD-10-CM | POA: Diagnosis not present

## 2019-03-23 DIAGNOSIS — G4730 Sleep apnea, unspecified: Secondary | ICD-10-CM | POA: Diagnosis not present

## 2019-03-23 DIAGNOSIS — Z853 Personal history of malignant neoplasm of breast: Secondary | ICD-10-CM | POA: Diagnosis not present

## 2019-03-23 DIAGNOSIS — I252 Old myocardial infarction: Secondary | ICD-10-CM | POA: Diagnosis not present

## 2019-03-23 DIAGNOSIS — I5031 Acute diastolic (congestive) heart failure: Secondary | ICD-10-CM | POA: Diagnosis not present

## 2019-03-23 DIAGNOSIS — M160 Bilateral primary osteoarthritis of hip: Secondary | ICD-10-CM | POA: Diagnosis not present

## 2019-03-23 DIAGNOSIS — M479 Spondylosis, unspecified: Secondary | ICD-10-CM | POA: Diagnosis not present

## 2019-03-23 DIAGNOSIS — Z7901 Long term (current) use of anticoagulants: Secondary | ICD-10-CM | POA: Diagnosis not present

## 2019-03-23 DIAGNOSIS — M810 Age-related osteoporosis without current pathological fracture: Secondary | ICD-10-CM | POA: Diagnosis not present

## 2019-03-23 DIAGNOSIS — N136 Pyonephrosis: Secondary | ICD-10-CM | POA: Diagnosis not present

## 2019-03-23 DIAGNOSIS — Z95828 Presence of other vascular implants and grafts: Secondary | ICD-10-CM | POA: Diagnosis not present

## 2019-03-23 DIAGNOSIS — I2510 Atherosclerotic heart disease of native coronary artery without angina pectoris: Secondary | ICD-10-CM | POA: Diagnosis not present

## 2019-03-23 NOTE — Patient Outreach (Signed)
[place] [facility]) Care Management  03/23/2019  Martha Mora 1946-01-15 [id number]    Covering Martha Mora RED: 03/22/2019- Diarrhea Outreach: 03/23/2019  RN attempted outreach to both the daughter Martha Mora) and pt's home contact numbers. Unable to leave a message at either number noted in Epic.   Will report to RN case manager [name]) to follow up accordingly with this pt for additional interventions on this issue.   Raina Mina, RN Care Management Coordinator [name] [phone]

## 2019-03-23 NOTE — Patient Outreach (Signed)
[place] [facility]) Care Management  03/23/2019  BEVERLYANN BROXTERMAN 1946-09-15 [id number]   Patient was called regarding medication review post discharge. Unfortunately, she did not answer the phone. HIPAA compliant message was left on her voicemail. Patient was sent an unsuccessful outreach letter yesterday.  Plan: Call patient back in 3-5 business days.  Elayne Guerin, PharmD, [place] Clinical Pharmacist [phone]

## 2019-03-24 ENCOUNTER — Encounter: Payer: Self-pay | Admitting: Family Medicine

## 2019-03-24 ENCOUNTER — Other Ambulatory Visit: Payer: Self-pay | Admitting: Urology

## 2019-03-25 ENCOUNTER — Other Ambulatory Visit: Payer: Self-pay | Admitting: Urology

## 2019-03-25 ENCOUNTER — Ambulatory Visit: Payer: Medicare Other | Admitting: Pharmacist

## 2019-03-25 MED ORDER — SODIUM CHLORIDE 0.9 % IV SOLN: 100.0000 mg | Freq: Once | INTRAVENOUS | Status: DC

## 2019-03-26 ENCOUNTER — Ambulatory Visit: Payer: Medicare Other | Admitting: Pharmacist

## 2019-03-26 ENCOUNTER — Other Ambulatory Visit: Payer: Self-pay | Admitting: Pharmacist

## 2019-03-26 DIAGNOSIS — I252 Old myocardial infarction: Secondary | ICD-10-CM | POA: Diagnosis not present

## 2019-03-26 DIAGNOSIS — Z792 Long term (current) use of antibiotics: Secondary | ICD-10-CM | POA: Diagnosis not present

## 2019-03-26 DIAGNOSIS — Z853 Personal history of malignant neoplasm of breast: Secondary | ICD-10-CM | POA: Diagnosis not present

## 2019-03-26 DIAGNOSIS — M810 Age-related osteoporosis without current pathological fracture: Secondary | ICD-10-CM | POA: Diagnosis not present

## 2019-03-26 DIAGNOSIS — J449 Chronic obstructive pulmonary disease, unspecified: Secondary | ICD-10-CM | POA: Diagnosis not present

## 2019-03-26 DIAGNOSIS — A4151 Sepsis due to Escherichia coli [E. coli]: Secondary | ICD-10-CM | POA: Diagnosis not present

## 2019-03-26 DIAGNOSIS — M479 Spondylosis, unspecified: Secondary | ICD-10-CM | POA: Diagnosis not present

## 2019-03-26 DIAGNOSIS — Z960 Presence of urogenital implants: Secondary | ICD-10-CM | POA: Diagnosis not present

## 2019-03-26 DIAGNOSIS — G4730 Sleep apnea, unspecified: Secondary | ICD-10-CM | POA: Diagnosis not present

## 2019-03-26 DIAGNOSIS — N136 Pyonephrosis: Secondary | ICD-10-CM | POA: Diagnosis not present

## 2019-03-26 DIAGNOSIS — I110 Hypertensive heart disease with heart failure: Secondary | ICD-10-CM | POA: Diagnosis not present

## 2019-03-26 DIAGNOSIS — I4891 Unspecified atrial fibrillation: Secondary | ICD-10-CM | POA: Diagnosis not present

## 2019-03-26 DIAGNOSIS — M160 Bilateral primary osteoarthritis of hip: Secondary | ICD-10-CM | POA: Diagnosis not present

## 2019-03-26 DIAGNOSIS — I2510 Atherosclerotic heart disease of native coronary artery without angina pectoris: Secondary | ICD-10-CM | POA: Diagnosis not present

## 2019-03-26 DIAGNOSIS — I5031 Acute diastolic (congestive) heart failure: Secondary | ICD-10-CM | POA: Diagnosis not present

## 2019-03-26 DIAGNOSIS — Z7901 Long term (current) use of anticoagulants: Secondary | ICD-10-CM | POA: Diagnosis not present

## 2019-03-26 DIAGNOSIS — Z95828 Presence of other vascular implants and grafts: Secondary | ICD-10-CM | POA: Diagnosis not present

## 2019-03-26 NOTE — Patient Outreach (Signed)
[place] [facility]) Care Management  03/26/2019  Martha Mora 1946-08-10 [id number]    Patient was called regarding medication review post discharge. Unfortunately, patient did not answer her phone. HIPAA compliant message was left on the voicemail.  Unsuccessful outreach letter as sent 03/22/2019. Due to upcoming PAL patient was called three times this week since the referral was for post discharge medication review.   Plan: Call patient back in 10-12  business days.   Elayne Guerin, PharmD, [place] Clinical Pharmacist [phone]

## 2019-03-30 ENCOUNTER — Other Ambulatory Visit: Payer: Self-pay | Admitting: *Deleted

## 2019-03-30 ENCOUNTER — Other Ambulatory Visit: Payer: Self-pay | Admitting: Family Medicine

## 2019-03-30 ENCOUNTER — Encounter: Payer: Self-pay | Admitting: *Deleted

## 2019-03-30 DIAGNOSIS — Z7901 Long term (current) use of anticoagulants: Secondary | ICD-10-CM | POA: Diagnosis not present

## 2019-03-30 DIAGNOSIS — A4151 Sepsis due to Escherichia coli [E. coli]: Secondary | ICD-10-CM | POA: Diagnosis not present

## 2019-03-30 DIAGNOSIS — I5031 Acute diastolic (congestive) heart failure: Secondary | ICD-10-CM | POA: Diagnosis not present

## 2019-03-30 DIAGNOSIS — Z853 Personal history of malignant neoplasm of breast: Secondary | ICD-10-CM | POA: Diagnosis not present

## 2019-03-30 DIAGNOSIS — Z95828 Presence of other vascular implants and grafts: Secondary | ICD-10-CM | POA: Diagnosis not present

## 2019-03-30 DIAGNOSIS — M810 Age-related osteoporosis without current pathological fracture: Secondary | ICD-10-CM | POA: Diagnosis not present

## 2019-03-30 DIAGNOSIS — J449 Chronic obstructive pulmonary disease, unspecified: Secondary | ICD-10-CM | POA: Diagnosis not present

## 2019-03-30 DIAGNOSIS — G4730 Sleep apnea, unspecified: Secondary | ICD-10-CM | POA: Diagnosis not present

## 2019-03-30 DIAGNOSIS — Z960 Presence of urogenital implants: Secondary | ICD-10-CM | POA: Diagnosis not present

## 2019-03-30 DIAGNOSIS — I2510 Atherosclerotic heart disease of native coronary artery without angina pectoris: Secondary | ICD-10-CM | POA: Diagnosis not present

## 2019-03-30 DIAGNOSIS — I110 Hypertensive heart disease with heart failure: Secondary | ICD-10-CM | POA: Diagnosis not present

## 2019-03-30 DIAGNOSIS — M160 Bilateral primary osteoarthritis of hip: Secondary | ICD-10-CM | POA: Diagnosis not present

## 2019-03-30 DIAGNOSIS — Z792 Long term (current) use of antibiotics: Secondary | ICD-10-CM | POA: Diagnosis not present

## 2019-03-30 DIAGNOSIS — M479 Spondylosis, unspecified: Secondary | ICD-10-CM | POA: Diagnosis not present

## 2019-03-30 DIAGNOSIS — I252 Old myocardial infarction: Secondary | ICD-10-CM | POA: Diagnosis not present

## 2019-03-30 DIAGNOSIS — I4891 Unspecified atrial fibrillation: Secondary | ICD-10-CM | POA: Diagnosis not present

## 2019-03-30 DIAGNOSIS — N136 Pyonephrosis: Secondary | ICD-10-CM | POA: Diagnosis not present

## 2019-03-30 NOTE — Patient Outreach (Signed)
[place] [facility]) Care Management  03/30/2019  Martha Mora 10-Oct-1946 [id number]   Call placed to [facility] caregiver/daughter [name] to follow up on member's status.  Member also present in the room during conversation.  She report she is doing much better, denies any shortness of breath, fever,  or chest discomfort.  State she is able to wear her CPAP more, but not wearing as instructed at night.  Aware that she should be also wearing at night, will work on improving compliance.  Report she has been working with home health PT, will possibly be discharged next week from program.    Remain on antibiotics, state diarrhea has decreased, appetite improved.  State she is continuing to work on hydration.  Has appointment for cystoscopy for Friday, will have stent removed and new one placed.  Denies any questions at this time.  Denies any urgent concerns, advised to contact this care manager with questions.  Will follow up within the next month.   Depression screen [facility] 2/9 03/30/2019 04/14/2018 04/15/2017 09/05/2016 08/15/2016  Decreased Interest 0 0 0 0 1  Down, Depressed, Hopeless 0 0 0 0 1  PHQ - 2 Score 0 0 0 0 2  Altered sleeping - - - - 3  Tired, decreased energy - - - - 2  Change in appetite - - - - 3  Feeling bad or failure about yourself  - - - - 1  Trouble concentrating - - - - 0  Moving slowly or fidgety/restless - - - - 0  Suicidal thoughts - - - - 0  PHQ-9 Score - - - - 11  Difficult doing work/chores - - - - Somewhat difficult  Some recent data might be hidden    [facility] CM Care Plan Problem One     Most Recent Value  Care Plan Problem One  Risk for readmission related to kidney injury and pneumonia as evidenced by recent hospitalization  Role Documenting the Problem One  Care Management Coordinator  Care Plan for Problem One  Active  THN Long Term Goal   Member will not be readmitted to hospital within the next 45 days  THN Long Term Goal Start Date  03/17/19   Interventions for Problem One Long Term Goal  Reviewed plan of care with daughter, importance of support in the home to decrease risk of readmission discussed, importance of continuing independent PT exercises to continue to increase strength discussed  THN CM Short Term Goal #1   Member will report decrease in anxiety and ability to wear CPAP through the night within the next 2 weeks  THN CM Short Term Goal #1 Start Date  03/17/19  Interventions for Short Term Goal #1  Advised of importance of wearing CPAP at night as well as with naps during the day.  THN CM Short Term Goal #2   Member/daughter will report medication compliance as instructed within the next 3 weeks  THN CM Short Term Goal #2 Start Date  03/17/19  [facility] CM Short Term Goal #2 Met Date  03/30/19  THN CM Short Term Goal #3  Member will keep and attend follow up appointments with kidney specialist and PCP within the next 2 weeks  THN CM Short Term Goal #3 Start Date  03/17/19  [facility] CM Short Term Goal #3 Met Date  03/30/19     Valente David, RN, MSN [place] Manager [phone]

## 2019-03-31 ENCOUNTER — Other Ambulatory Visit (HOSPITAL_COMMUNITY)
Admission: RE | Admit: 2019-03-31 | Discharge: 2019-03-31 | Disposition: A | Discharge status: Home / Self Care | Payer: Medicare Other | Source: Ambulatory Visit

## 2019-03-31 ENCOUNTER — Encounter (HOSPITAL_COMMUNITY)
Admission: RE | Admit: 2019-03-31 | Discharge: 2019-03-31 | Disposition: A | Discharge status: Home / Self Care | Payer: Medicare Other | Source: Ambulatory Visit | Attending: Urology | Admitting: Urology

## 2019-03-31 ENCOUNTER — Other Ambulatory Visit: Payer: Self-pay

## 2019-03-31 ENCOUNTER — Encounter (HOSPITAL_COMMUNITY): Payer: Self-pay

## 2019-03-31 DIAGNOSIS — J90 Pleural effusion, not elsewhere classified: Secondary | ICD-10-CM | POA: Diagnosis not present

## 2019-03-31 DIAGNOSIS — J449 Chronic obstructive pulmonary disease, unspecified: Secondary | ICD-10-CM | POA: Diagnosis not present

## 2019-03-31 DIAGNOSIS — Z1159 Encounter for screening for other viral diseases: Secondary | ICD-10-CM | POA: Insufficient documentation

## 2019-03-31 DIAGNOSIS — I4891 Unspecified atrial fibrillation: Secondary | ICD-10-CM | POA: Diagnosis not present

## 2019-03-31 DIAGNOSIS — N132 Hydronephrosis with renal and ureteral calculous obstruction: Secondary | ICD-10-CM | POA: Diagnosis not present

## 2019-03-31 DIAGNOSIS — G4733 Obstructive sleep apnea (adult) (pediatric): Secondary | ICD-10-CM | POA: Diagnosis not present

## 2019-03-31 DIAGNOSIS — F419 Anxiety disorder, unspecified: Secondary | ICD-10-CM | POA: Diagnosis not present

## 2019-03-31 DIAGNOSIS — F329 Major depressive disorder, single episode, unspecified: Secondary | ICD-10-CM | POA: Diagnosis not present

## 2019-03-31 DIAGNOSIS — Z955 Presence of coronary angioplasty implant and graft: Secondary | ICD-10-CM | POA: Diagnosis not present

## 2019-03-31 DIAGNOSIS — M1990 Unspecified osteoarthritis, unspecified site: Secondary | ICD-10-CM | POA: Diagnosis not present

## 2019-03-31 DIAGNOSIS — Z880 Allergy status to penicillin: Secondary | ICD-10-CM | POA: Diagnosis not present

## 2019-03-31 DIAGNOSIS — I252 Old myocardial infarction: Secondary | ICD-10-CM | POA: Diagnosis not present

## 2019-03-31 DIAGNOSIS — Z01812 Encounter for preprocedural laboratory examination: Secondary | ICD-10-CM | POA: Insufficient documentation

## 2019-03-31 DIAGNOSIS — G2581 Restless legs syndrome: Secondary | ICD-10-CM | POA: Diagnosis not present

## 2019-03-31 DIAGNOSIS — N200 Calculus of kidney: Secondary | ICD-10-CM | POA: Insufficient documentation

## 2019-03-31 DIAGNOSIS — F17210 Nicotine dependence, cigarettes, uncomplicated: Secondary | ICD-10-CM | POA: Diagnosis not present

## 2019-03-31 DIAGNOSIS — E669 Obesity, unspecified: Secondary | ICD-10-CM | POA: Diagnosis not present

## 2019-03-31 DIAGNOSIS — Z853 Personal history of malignant neoplasm of breast: Secondary | ICD-10-CM | POA: Diagnosis not present

## 2019-03-31 DIAGNOSIS — E785 Hyperlipidemia, unspecified: Secondary | ICD-10-CM | POA: Diagnosis not present

## 2019-03-31 DIAGNOSIS — Z881 Allergy status to other antibiotic agents status: Secondary | ICD-10-CM | POA: Diagnosis not present

## 2019-03-31 DIAGNOSIS — Z7901 Long term (current) use of anticoagulants: Secondary | ICD-10-CM | POA: Diagnosis not present

## 2019-03-31 DIAGNOSIS — K219 Gastro-esophageal reflux disease without esophagitis: Secondary | ICD-10-CM | POA: Diagnosis not present

## 2019-03-31 DIAGNOSIS — I2510 Atherosclerotic heart disease of native coronary artery without angina pectoris: Secondary | ICD-10-CM | POA: Diagnosis not present

## 2019-03-31 DIAGNOSIS — I10 Essential (primary) hypertension: Secondary | ICD-10-CM | POA: Diagnosis not present

## 2019-03-31 HISTORY — DX: Dyspnea, unspecified: R06.00

## 2019-03-31 HISTORY — DX: Other specified health status: Z78.9

## 2019-03-31 HISTORY — DX: Sepsis due to Enterococcus: A41.81

## 2019-03-31 LAB — CBC
HCT: 33.1 % — ABNORMAL LOW (ref 36.0–46.0)
Hemoglobin: 9.8 g/dL — ABNORMAL LOW (ref 12.0–15.0)
MCH: 26.8 pg (ref 26.0–34.0)
MCHC: 29.6 g/dL — ABNORMAL LOW (ref 30.0–36.0)
MCV: 90.4 fL (ref 80.0–100.0)
Platelets: 165 10*3/uL (ref 150–400)
RBC: 3.66 MIL/uL — ABNORMAL LOW (ref 3.87–5.11)
RDW: 17 % — ABNORMAL HIGH (ref 11.5–15.5)
WBC: 12.5 10*3/uL — ABNORMAL HIGH (ref 4.0–10.5)
nRBC: 0 % (ref 0.0–0.2)

## 2019-03-31 LAB — BASIC METABOLIC PANEL
Anion gap: 10 (ref 5–15)
BUN: 14 mg/dL (ref 8–23)
CO2: 28 mmol/L (ref 22–32)
Calcium: 9 mg/dL (ref 8.9–10.3)
Chloride: 100 mmol/L (ref 98–111)
Creatinine, Ser: 1.24 mg/dL — ABNORMAL HIGH (ref 0.44–1.00)
GFR calc Af Amer: 49 mL/min — ABNORMAL LOW (ref >60)
GFR calc non Af Amer: 42 mL/min — ABNORMAL LOW (ref >60)
Glucose, Bld: 114 mg/dL — ABNORMAL HIGH (ref 70–99)
Potassium: 4.3 mmol/L (ref 3.5–5.1)
Sodium: 138 mmol/L (ref 135–145)

## 2019-03-31 LAB — SARS CORONAVIRUS 2 BY RT PCR (HOSPITAL ORDER, PERFORMED IN ~~LOC~~ HOSPITAL LAB): SARS Coronavirus 2: NEGATIVE

## 2019-03-31 NOTE — Progress Notes (Signed)
PCP alison Rumball EKG 03/03/19 ECHO 02/26/19 CXR 03/05/19 are in epic Dr. Junious Silk instructed patient to continue Eliquis  At office visit 5/7 She does not have a clotting disorder. She doesn't use her C-PAP but was told to bring mask and tubing. Craig Staggers  PA  please review chart

## 2019-03-31 NOTE — Patient Instructions (Signed)
Martha Mora    Your procedure is scheduled on:   Report to [place]  Entrance  Report to admitting at AM   [place] 19 TEST  TODAY. THIS TEST MUST BE DONE BEFORE SURGERY, COME TO [place] [facility] ENTRANCE BETWEEN THE HOURS OF 900 AM AND 300 PM ON YOUR COVID TEST DATE.   Call this number if you have problems the morning of surgery [phone]    Remember: Do not eat food or drink liquids :After Midnight. BRUSH YOUR TEETH MORNING OF SURGERY AND RINSE YOUR MOUTH OUT, NO CHEWING GUM CANDY OR MINTS.     Take these medicines the morning of surgery with A SIP OF WATER: Amiodarone, Citalopram,Omeprazole.                                 You may not have any metal on your body including hair pins and              piercings  Do not wear jewelry, make-up, lotions, powders or perfumes, deodorant             Do not wear nail polish.  Do not shave  48 hours prior to surgery.                  Do not bring valuables to the hospital. [place].  Contacts, dentures or bridgework may not be worn into surgery.      Patients discharged the day of surgery will not be allowed to drive home. IF YOU ARE HAVING SURGERY AND GOING HOME THE SAME DAY, YOU MUST HAVE AN ADULT TO DRIVE YOU HOME AND BE WITH YOU FOR 24 HOURS. YOU MAY GO HOME BY TAXI OR UBER OR ORTHERWISE, BUT AN ADULT MUST ACCOMPANY YOU HOME AND STAY WITH YOU FOR 24 HOURS.  Name and phone number of your driver: [facility] - Preparing for Surgery Before surgery, you can play an important role.  Because skin is not sterile, your skin needs to be as free of germs as possible.  You can reduce the number of germs on your skin by washing with CHG (chlorahexidine gluconate) soap before surgery.  CHG is an antiseptic cleaner which kills germs and bonds with the skin to continue killing germs even after washing. Please DO NOT use if  you have an allergy to CHG or antibacterial soaps.  If your skin becomes reddened/irritated stop using the CHG and inform your nurse when you arrive at Short Stay. Do not shave (including legs and underarms) for at least 48 hours prior to the first CHG shower.  You may shave your face/neck. Please follow these instructions carefully:  1.  Shower with CHG Soap the night before surgery and the  morning of Surgery.  2.  If you choose to wash your hair, wash your hair first as usual with your  normal  shampoo.  3.  After you shampoo, rinse your hair and body thoroughly to remove the  shampoo.  4.  Use CHG as you would any other liquid soap.  You can apply chg directly  to the skin and wash                       Gently with a scrungie or clean washcloth.  5.  Apply the CHG Soap to your body ONLY FROM THE NECK DOWN.   Do not use on face/ open                           Wound or open sores. Avoid contact with eyes, ears mouth and genitals (private parts).                       Wash face,  Genitals (private parts) with your normal soap.             6.  Wash thoroughly, paying special attention to the area where your surgery  will be performed.  7.  Thoroughly rinse your body with warm water from the neck down.  8.  DO NOT shower/wash with your normal soap after using and rinsing off  the CHG Soap.             9.  Pat yourself dry with a clean towel.            10.  Wear clean pajamas.            11.  Place clean sheets on your bed the night of your first shower and do not  sleep with pets. Day of Surgery : Do not apply any lotions/deodorants the morning of surgery.  Please wear clean clothes to the hospital/surgery center.  FAILURE TO FOLLOW THESE INSTRUCTIONS MAY RESULT IN THE CANCELLATION OF YOUR SURGERY PATIENT SIGNATURE_________________________________  NURSE  SIGNATURE__________________________________  ________________________________________________________________________   Special Instructions: N/A              Please read over the following fact sheets you were given: _____________________________________________________________________             _

## 2019-04-01 ENCOUNTER — Other Ambulatory Visit: Payer: Self-pay | Admitting: Family Medicine

## 2019-04-01 DIAGNOSIS — I110 Hypertensive heart disease with heart failure: Secondary | ICD-10-CM | POA: Diagnosis not present

## 2019-04-01 DIAGNOSIS — M810 Age-related osteoporosis without current pathological fracture: Secondary | ICD-10-CM | POA: Diagnosis not present

## 2019-04-01 DIAGNOSIS — I2510 Atherosclerotic heart disease of native coronary artery without angina pectoris: Secondary | ICD-10-CM | POA: Diagnosis not present

## 2019-04-01 DIAGNOSIS — Z95828 Presence of other vascular implants and grafts: Secondary | ICD-10-CM | POA: Diagnosis not present

## 2019-04-01 DIAGNOSIS — M160 Bilateral primary osteoarthritis of hip: Secondary | ICD-10-CM | POA: Diagnosis not present

## 2019-04-01 DIAGNOSIS — M479 Spondylosis, unspecified: Secondary | ICD-10-CM | POA: Diagnosis not present

## 2019-04-01 DIAGNOSIS — I4891 Unspecified atrial fibrillation: Secondary | ICD-10-CM | POA: Diagnosis not present

## 2019-04-01 DIAGNOSIS — A4151 Sepsis due to Escherichia coli [E. coli]: Secondary | ICD-10-CM | POA: Diagnosis not present

## 2019-04-01 DIAGNOSIS — N136 Pyonephrosis: Secondary | ICD-10-CM | POA: Diagnosis not present

## 2019-04-01 DIAGNOSIS — J449 Chronic obstructive pulmonary disease, unspecified: Secondary | ICD-10-CM | POA: Diagnosis not present

## 2019-04-01 DIAGNOSIS — Z7901 Long term (current) use of anticoagulants: Secondary | ICD-10-CM | POA: Diagnosis not present

## 2019-04-01 DIAGNOSIS — G4730 Sleep apnea, unspecified: Secondary | ICD-10-CM | POA: Diagnosis not present

## 2019-04-01 DIAGNOSIS — Z792 Long term (current) use of antibiotics: Secondary | ICD-10-CM | POA: Diagnosis not present

## 2019-04-01 DIAGNOSIS — I252 Old myocardial infarction: Secondary | ICD-10-CM | POA: Diagnosis not present

## 2019-04-01 DIAGNOSIS — I5031 Acute diastolic (congestive) heart failure: Secondary | ICD-10-CM | POA: Diagnosis not present

## 2019-04-01 DIAGNOSIS — Z853 Personal history of malignant neoplasm of breast: Secondary | ICD-10-CM | POA: Diagnosis not present

## 2019-04-01 DIAGNOSIS — Z960 Presence of urogenital implants: Secondary | ICD-10-CM | POA: Diagnosis not present
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
## 2019-04-01 NOTE — Anesthesia Preprocedure Evaluation (Addendum)
Anesthesia Evaluation  Patient identified by MRN, date of birth, ID band Patient awake    Reviewed: Allergy & Precautions, NPO status , Patient's Chart, lab work & pertinent test results  History of Anesthesia Complications Negative for: history of anesthetic complications  Airway Mallampati: II  TM Distance: >3 FB Neck ROM: Full    Dental  (+) Edentulous Upper, Edentulous Lower   Pulmonary sleep apnea and Continuous Positive Airway Pressure Ventilation , COPD, former smoker,   Recent ICU stay with intubation 02/2019   breath sounds clear to auscultation       Cardiovascular hypertension, Pt. on medications (-) angina+ CAD, + Past MI and + Cardiac Stents  + dysrhythmias Atrial Fibrillation  Rhythm:Regular Rate:Normal   '20 TTE - EF 55-60%. Inferior basal akinesis. LA was moderately dilated. AV sclerosis without any evidence of stenosis. Trivial AI.    Neuro/Psych PSYCHIATRIC DISORDERS Anxiety Depression negative neurological ROS     GI/Hepatic Neg liver ROS, GERD  Medicated,  Endo/Other   Obesity   Renal/GU Renal InsufficiencyRenal disease     Musculoskeletal  (+) Arthritis ,   Abdominal   Peds  Hematology  (+) anemia ,   Anesthesia Other Findings   Reproductive/Obstetrics                           Anesthesia Physical Anesthesia Plan  ASA: III  Anesthesia Plan: General   Post-op Pain Management:    Induction: Intravenous  PONV Risk Score and Plan: 4 or greater and Treatment may vary due to age or medical condition, Ondansetron and Dexamethasone  Airway Management Planned: LMA  Additional Equipment: None  Intra-op Plan:   Post-operative Plan: Extubation in OR  Informed Consent: I have reviewed the patients History and Physical, chart, labs and discussed the procedure including the risks, benefits and alternatives for the proposed anesthesia with the patient or authorized  representative who has indicated his/her understanding and acceptance.     Dental advisory given  Plan Discussed with: CRNA and Anesthesiologist  Anesthesia Plan Comments:       Anesthesia Quick Evaluation

## 2019-04-02 ENCOUNTER — Ambulatory Visit (HOSPITAL_COMMUNITY): Payer: Medicare Other

## 2019-04-02 ENCOUNTER — Encounter (INDEPENDENT_AMBULATORY_CARE_PROVIDER_SITE_OTHER): Payer: Medicare Other | Admitting: Ophthalmology

## 2019-04-02 ENCOUNTER — Encounter (HOSPITAL_COMMUNITY): Payer: Self-pay | Admitting: *Deleted

## 2019-04-02 ENCOUNTER — Ambulatory Visit (HOSPITAL_COMMUNITY): Payer: Medicare Other | Admitting: Physician Assistant

## 2019-04-02 ENCOUNTER — Ambulatory Visit (HOSPITAL_COMMUNITY)
Admission: RE | Admit: 2019-04-02 | Discharge: 2019-04-02 | Disposition: A | Discharge status: Home / Self Care | Payer: Medicare Other | Attending: Urology | Admitting: Urology

## 2019-04-02 ENCOUNTER — Encounter (HOSPITAL_COMMUNITY)
Admission: RE | Disposition: A | Discharge status: Home / Self Care | Payer: Self-pay | Source: Home / Self Care | Attending: Urology

## 2019-04-02 DIAGNOSIS — J449 Chronic obstructive pulmonary disease, unspecified: Secondary | ICD-10-CM | POA: Diagnosis not present

## 2019-04-02 DIAGNOSIS — E669 Obesity, unspecified: Secondary | ICD-10-CM | POA: Insufficient documentation

## 2019-04-02 DIAGNOSIS — I2510 Atherosclerotic heart disease of native coronary artery without angina pectoris: Secondary | ICD-10-CM | POA: Diagnosis not present

## 2019-04-02 DIAGNOSIS — J90 Pleural effusion, not elsewhere classified: Secondary | ICD-10-CM | POA: Insufficient documentation

## 2019-04-02 DIAGNOSIS — I10 Essential (primary) hypertension: Secondary | ICD-10-CM | POA: Insufficient documentation

## 2019-04-02 DIAGNOSIS — I252 Old myocardial infarction: Secondary | ICD-10-CM | POA: Insufficient documentation

## 2019-04-02 DIAGNOSIS — M1990 Unspecified osteoarthritis, unspecified site: Secondary | ICD-10-CM | POA: Insufficient documentation

## 2019-04-02 DIAGNOSIS — F329 Major depressive disorder, single episode, unspecified: Secondary | ICD-10-CM | POA: Insufficient documentation

## 2019-04-02 DIAGNOSIS — Z7901 Long term (current) use of anticoagulants: Secondary | ICD-10-CM | POA: Insufficient documentation

## 2019-04-02 DIAGNOSIS — G4733 Obstructive sleep apnea (adult) (pediatric): Secondary | ICD-10-CM | POA: Insufficient documentation

## 2019-04-02 DIAGNOSIS — K5730 Diverticulosis of large intestine without perforation or abscess without bleeding: Secondary | ICD-10-CM | POA: Diagnosis not present

## 2019-04-02 DIAGNOSIS — N201 Calculus of ureter: Secondary | ICD-10-CM | POA: Diagnosis not present

## 2019-04-02 DIAGNOSIS — Z1159 Encounter for screening for other viral diseases: Secondary | ICD-10-CM | POA: Insufficient documentation

## 2019-04-02 DIAGNOSIS — Z853 Personal history of malignant neoplasm of breast: Secondary | ICD-10-CM | POA: Diagnosis not present

## 2019-04-02 DIAGNOSIS — E785 Hyperlipidemia, unspecified: Secondary | ICD-10-CM | POA: Insufficient documentation

## 2019-04-02 DIAGNOSIS — K219 Gastro-esophageal reflux disease without esophagitis: Secondary | ICD-10-CM | POA: Diagnosis not present

## 2019-04-02 DIAGNOSIS — Z955 Presence of coronary angioplasty implant and graft: Secondary | ICD-10-CM | POA: Insufficient documentation

## 2019-04-02 DIAGNOSIS — C50511 Malignant neoplasm of lower-outer quadrant of right female breast: Secondary | ICD-10-CM | POA: Diagnosis not present

## 2019-04-02 DIAGNOSIS — I4891 Unspecified atrial fibrillation: Secondary | ICD-10-CM | POA: Insufficient documentation

## 2019-04-02 DIAGNOSIS — Z880 Allergy status to penicillin: Secondary | ICD-10-CM | POA: Insufficient documentation

## 2019-04-02 DIAGNOSIS — N132 Hydronephrosis with renal and ureteral calculous obstruction: Secondary | ICD-10-CM | POA: Diagnosis not present

## 2019-04-02 DIAGNOSIS — N200 Calculus of kidney: Secondary | ICD-10-CM | POA: Diagnosis not present

## 2019-04-02 DIAGNOSIS — F419 Anxiety disorder, unspecified: Secondary | ICD-10-CM | POA: Insufficient documentation

## 2019-04-02 DIAGNOSIS — Z881 Allergy status to other antibiotic agents status: Secondary | ICD-10-CM | POA: Insufficient documentation

## 2019-04-02 DIAGNOSIS — G2581 Restless legs syndrome: Secondary | ICD-10-CM | POA: Insufficient documentation

## 2019-04-02 DIAGNOSIS — F17210 Nicotine dependence, cigarettes, uncomplicated: Secondary | ICD-10-CM | POA: Insufficient documentation

## 2019-04-02 DIAGNOSIS — K222 Esophageal obstruction: Secondary | ICD-10-CM | POA: Diagnosis not present

## 2019-04-02 HISTORY — PX: CYSTOSCOPY WITH RETROGRADE URETHROGRAM: SHX6309

## 2019-04-02 SURGERY — CYSTOSCOPY WITH RETROGRADE URETHROGRAM
Anesthesia: General | Laterality: Left

## 2019-04-02 MED ORDER — LACTATED RINGERS IV SOLN
INTRAVENOUS | Status: DC
  Administered 2019-04-02: 07:00:00 via INTRAVENOUS

## 2019-04-02 MED ORDER — ONDANSETRON HCL 4 MG/2ML IJ SOLN: 4.0000 mg | Freq: Once | INTRAMUSCULAR | Status: DC | PRN

## 2019-04-02 MED ORDER — FENTANYL CITRATE (PF) 100 MCG/2ML IJ SOLN: 25.0000 ug | INTRAMUSCULAR | Status: DC | PRN

## 2019-04-02 MED ORDER — OXYCODONE HCL 5 MG/5ML PO SOLN: 5.0000 mg | Freq: Once | ORAL | Status: DC | PRN

## 2019-04-02 MED ORDER — CIPROFLOXACIN IN D5W 400 MG/200ML IV SOLN
400.0000 mg | Freq: Once | INTRAVENOUS | Status: AC
  Administered 2019-04-02: 400 mg via INTRAVENOUS
  Filled 2019-04-02: qty 200

## 2019-04-02 MED ORDER — ONDANSETRON HCL 4 MG/2ML IJ SOLN
INTRAMUSCULAR | Status: DC | PRN
  Administered 2019-04-02: 4 mg via INTRAVENOUS

## 2019-04-02 MED ORDER — SODIUM CHLORIDE 0.9 % IV SOLN: 200.0000 mg | Freq: Once | INTRAVENOUS | Status: DC

## 2019-04-02 MED ORDER — SODIUM CHLORIDE 0.9 % IR SOLN
Status: DC | PRN
  Administered 2019-04-02: 6000 mL via INTRAVESICAL

## 2019-04-02 MED ORDER — SODIUM CHLORIDE 0.9 % IV SOLN
100.0000 mg | Freq: Once | INTRAVENOUS | Status: AC
  Administered 2019-04-02: 100 mg via INTRAVENOUS
  Filled 2019-04-02: qty 100

## 2019-04-02 MED ORDER — PROPOFOL 10 MG/ML IV BOLUS
INTRAVENOUS | Status: DC | PRN
  Administered 2019-04-02 (×3): 20 mg via INTRAVENOUS
  Administered 2019-04-02: 100 mg via INTRAVENOUS

## 2019-04-02 MED ORDER — FENTANYL CITRATE (PF) 100 MCG/2ML IJ SOLN
INTRAMUSCULAR | Status: AC
  Filled 2019-04-02: qty 2

## 2019-04-02 MED ORDER — LIDOCAINE HCL (CARDIAC) PF 100 MG/5ML IV SOSY
PREFILLED_SYRINGE | INTRAVENOUS | Status: DC | PRN
  Administered 2019-04-02: 60 mg via INTRAVENOUS

## 2019-04-02 MED ORDER — IOHEXOL 300 MG/ML  SOLN
INTRAMUSCULAR | Status: DC | PRN
  Administered 2019-04-02: 2 mL

## 2019-04-02 MED ORDER — FENTANYL CITRATE (PF) 100 MCG/2ML IJ SOLN
INTRAMUSCULAR | Status: DC | PRN
  Administered 2019-04-02 (×2): 25 ug via INTRAVENOUS
  Administered 2019-04-02: 50 ug via INTRAVENOUS

## 2019-04-02 MED ORDER — DEXAMETHASONE SODIUM PHOSPHATE 10 MG/ML IJ SOLN
INTRAMUSCULAR | Status: DC | PRN
  Administered 2019-04-02: 4 mg via INTRAVENOUS

## 2019-04-02 MED ORDER — PROPOFOL 10 MG/ML IV BOLUS
INTRAVENOUS | Status: AC
  Filled 2019-04-02: qty 20

## 2019-04-02 MED ORDER — OXYCODONE HCL 5 MG PO TABS: 5.0000 mg | ORAL_TABLET | Freq: Once | ORAL | Status: DC | PRN

## 2019-04-02 SURGICAL SUPPLY — 20 items
BAG URO CATCHER STRL LF (MISCELLANEOUS) ×2 IMPLANT
BASKET ZERO TIP NITINOL 2.4FR (BASKET) ×2 IMPLANT
CATH INTERMIT  6FR 70CM (CATHETERS) ×4 IMPLANT
CATH URET 5FR 28IN CONE TIP (BALLOONS) ×1
CATH URET 5FR 70CM CONE TIP (BALLOONS) ×1 IMPLANT
CLOTH BEACON ORANGE TIMEOUT ST (SAFETY) ×2 IMPLANT
COVER WAND RF STERILE (DRAPES) IMPLANT
FIBER LASER TRAC TIP (UROLOGICAL SUPPLIES) ×1 IMPLANT
GLOVE BIO SURGEON STRL SZ7.5 (GLOVE) ×2 IMPLANT
GOWN STRL REUS W/TWL XL LVL3 (GOWN DISPOSABLE) ×2 IMPLANT
GUIDEWIRE ANG ZIPWIRE 038X150 (WIRE) ×1 IMPLANT
GUIDEWIRE STR DUAL SENSOR (WIRE) ×2 IMPLANT
KIT TURNOVER KIT A (KITS) ×1 IMPLANT
MANIFOLD NEPTUNE II (INSTRUMENTS) ×2 IMPLANT
PACK CYSTO (CUSTOM PROCEDURE TRAY) ×2 IMPLANT
SHEATH URETERAL 12FRX28CM (UROLOGICAL SUPPLIES) ×2 IMPLANT
SHEATH URETERAL 12FRX35CM (MISCELLANEOUS) ×2 IMPLANT
STENT URET 6FRX24 CONTOUR (STENTS) ×1 IMPLANT
TUBING CONNECTING 10 (TUBING) ×2 IMPLANT
WIRE COONS/BENSON .038X145CM (WIRE) ×2 IMPLANT

## 2019-04-02 NOTE — Op Note (Signed)
Preoperative diagnosis: Left renal stone Postoperative diagnosis: Left renal stone  Procedure: Cystoscopy with left retrograde pyelogram, left ureteroscopy with holmium laser lithotripsy, stone basket extraction and left ureteral stent exchange  Surgeon: Junious Silk  Anesthesia: General  Indication for procedure: 73 year old female who was urgently stented for sepsis and a left UPJ stone.  She was brought today for definitive stone management.  Findings: Large capacious kidney stone in the left lower pole which washed up to the left upper pole and had to be moved to the left midpole for fragmentation.  Excellent testing.  Left retrograde pyelogram-this outlined the proximal ureter and the dilated collecting system.  The stone was noted in the left lower pole is a filling defect.  It was also visible on scout fluoroscopy imaging.  Description of procedure: After consent was obtained she was brought to the operating room and placed in lithotomy position and prepped and draped in the usual sterile fashion.  Timeout was performed to confirm the patient and procedure.  The cystoscope was passed per urethra and the bladder flushed multiple times until clear.  Left ureteral stent was grasped and removed through the urethral meatus and the sensor wire advanced through the stent.  The kidney was low in her abdomen and she had right leaning scoliosis.  The stone was noted in the left lower pole.  I then passed an access sheath to place 2 wires but it would not progress to the distal ureter.  Therefore I passed the semirigid ureteroscope to ensure no stone fragments or stones in the ureter.  The ureter was clear but I could not quite make the turn over the iliacs.  I passed a guidewire under direct visualization.  I backed the scope out and I passed the inner cannula of the access sheath into the proximal ureter and the distal ureter was still quite tight.  To ensure proper placement I pulled the guidewire and  injected contrast which outlined the proximal ureter and collecting system.  I repassed a Glidewire and remove the inner cannula.  I then passed the access sheath over the sensor wire adjacent to the Glidewire and it went into the proximal ureter without difficulty.  The digital ureteroscope was passed into the kidney.  There was a lot of debris here.  I irrigated the debris with a 10 cc syringe and in doing this the stone was in the lower pole and some mild washed up to the upper pole.  The upper pole had a strange configuration it was very posterior and I could not quite get the scope to turn properly to get the laser on the stone.  I passed a 200 m laser fiber.  Therefore the stone was grasped with a 0 tip basket and dropped in the midpole calyx which provided a straighter angle for fragmentation.  Here the stone was dusted at 0.4 and 50 and then 0.8 and 8 to finish it off.  Several fragments were removed and all 1 to 2 mm.  No obvious stones remained on fluoroscopy.  Inspection of the collecting system noted no significant stones.  The renal pelvis and proximal ureter appeared normal.  The scope was backed out with the access sheath and the ureter carefully inspected and noted to have no stone fragment and no injury.  There was no obvious stricture or tumor.  The entire collecting system and ureter was slightly oozing likely because her continued Eliquis.  The wire was then backloaded on the cystoscope and a 6 x  24 cm stent advanced.  A good coil seen in the upper pole calyx and a good coil in the bladder.  I left a string on the stent.  The bladder was drained and the scope removed.  She was awakened and taken to recovery room in stable condition.  Complications: None  Blood loss: Minimal  Specimens: Stone fragments to office lab  Drains: 6 x 24 cm left ureteral stent with string  Disposition: Patient stable to PACU

## 2019-04-02 NOTE — Discharge Instructions (Signed)
Ureteral Stent Implantation, Care After Refer to this sheet in the next few weeks. These instructions provide you with information about caring for yourself after your procedure. Your health care provider may also give you more specific instructions. Your treatment has been planned according to current medical practices, but problems sometimes occur. Call your health care provider if you have any problems or questions after your procedure.  Removal of the stent: Remove the stent by pulling the string on Tuesday morning, Apr 06, 2019  What can I expect after the procedure? After the procedure, it is common to have:  Nausea.  Mild pain when you urinate. You may feel this pain in your lower back or lower abdomen. Pain should stop within a few minutes after you urinate. This may last for up to 1 week.  A small amount of blood in your urine for several days. Follow these instructions at home:  Medicines  Take over-the-counter and prescription medicines only as told by your health care provider.  If you were prescribed an antibiotic medicine, take it as told by your health care provider. Do not stop taking the antibiotic even if you start to feel better.  Do not drive for 24 hours if you received a sedative.  Do not drive or operate heavy machinery while taking prescription pain medicines. Activity  Return to your normal activities as told by your health care provider. Ask your health care provider what activities are safe for you.  Do not lift anything that is heavier than 10 lb (4.5 kg). Follow this limit for 1 week after your procedure, or for as long as told by your health care provider. General instructions  Watch for any blood in your urine. Call your health care provider if the amount of blood in your urine increases.  If you have a catheter: ? Follow instructions from your health care provider about taking care of your catheter and collection bag. ? Do not take baths, swim, or  use a hot tub until your health care provider approves.  Drink enough fluid to keep your urine clear or pale yellow.  Keep all follow-up visits as told by your health care provider. This is important. Contact a health care provider if:  You have pain that gets worse or does not get better with medicine, especially pain when you urinate.  You have difficulty urinating.  You feel nauseous or you vomit repeatedly during a period of more than 2 days after the procedure. Get help right away if:  Your urine is dark red or has blood clots in it.  You are leaking urine (have incontinence).  The end of the stent comes out of your urethra.  You cannot urinate.  You have sudden, sharp, or severe pain in your abdomen or lower back.  You have a fever. This information is not intended to replace advice given to you by your health care provider. Make sure you discuss any questions you have with your health care provider. Document Released: 07/07/2013 Document Revised: 04/11/2016 Document Reviewed: 05/19/2015 Elsevier Interactive Patient Education  2019 [place] Anesthesia, Adult, Care After This sheet gives you information about how to care for yourself after your procedure. Your health care provider may also give you more specific instructions. If you have problems or questions, contact your health care provider. What can I expect after the procedure? After the procedure, the following side effects are common:  Pain or discomfort at the IV site.  Nausea.  Vomiting.  Sore throat.  Trouble concentrating.  Feeling cold or chills.  Weak or tired.  Sleepiness and fatigue.  Soreness and body aches. These side effects can affect parts of the body that were not involved in surgery. Follow these instructions at home:  For at least 24 hours after the procedure:  Have a responsible adult stay with you. It is important to have someone help care for you until you are  awake and alert.  Rest as needed.  Do not: ? Participate in activities in which you could fall or become injured. ? Drive. ? Use heavy machinery. ? Drink alcohol. ? Take sleeping pills or medicines that cause drowsiness. ? Make important decisions or sign legal documents. ? Take care of children on your own. Eating and drinking  Follow any instructions from your health care provider about eating or drinking restrictions.  When you feel hungry, start by eating small amounts of foods that are soft and easy to digest (bland), such as toast. Gradually return to your regular diet.  Drink enough fluid to keep your urine pale yellow.  If you vomit, rehydrate by drinking water, juice, or clear broth. General instructions  If you have sleep apnea, surgery and certain medicines can increase your risk for breathing problems. Follow instructions from your health care provider about wearing your sleep device: ? Anytime you are sleeping, including during daytime naps. ? While taking prescription pain medicines, sleeping medicines, or medicines that make you drowsy.  Return to your normal activities as told by your health care provider. Ask your health care provider what activities are safe for you.  Take over-the-counter and prescription medicines only as told by your health care provider.  If you smoke, do not smoke without supervision.  Keep all follow-up visits as told by your health care provider. This is important. Contact a health care provider if:  You have nausea or vomiting that does not get better with medicine.  You cannot eat or drink without vomiting.  You have pain that does not get better with medicine.  You are unable to pass urine.  You develop a skin rash.  You have a fever.  You have redness around your IV site that gets worse. Get help right away if:  You have difficulty breathing.  You have chest pain.  You have blood in your urine or stool, or you vomit  blood. Summary  After the procedure, it is common to have a sore throat or nausea. It is also common to feel tired.  Have a responsible adult stay with you for the first 24 hours after general anesthesia. It is important to have someone help care for you until you are awake and alert.  When you feel hungry, start by eating small amounts of foods that are soft and easy to digest (bland), such as toast. Gradually return to your regular diet.  Drink enough fluid to keep your urine pale yellow.  Return to your normal activities as told by your health care provider. Ask your health care provider what activities are safe for you. This information is not intended to replace advice given to you by your health care provider. Make sure you discuss any questions you have with your health care provider. Document Released: 02/10/2001 Document Revised: 06/20/2017 Document Reviewed: 06/20/2017 Elsevier Interactive Patient Education  2019 [employer].

## 2019-04-02 NOTE — Anesthesia Postprocedure Evaluation (Signed)
Anesthesia Post Note  Patient: Martha Mora  Procedure(s) Performed: CYSTOSCOPY WITH LEFT RETROGRADE; BASKET EXTRACTION; LEFT  URETEROSCOPY, HOLMIUM LASER LITHOTRIPSY/ STENT EXCHANGE (Left )     Patient location during evaluation: PACU Anesthesia Type: General Level of consciousness: awake and alert Pain management: pain level controlled Vital Signs Assessment: post-procedure vital signs reviewed and stable Respiratory status: spontaneous breathing, nonlabored ventilation and respiratory function stable Cardiovascular status: blood pressure returned to baseline and stable Postop Assessment: no apparent nausea or vomiting Anesthetic complications: no    Last Vitals:  Vitals:   04/02/19 1045 04/02/19 1111  BP: (!) 145/50 (!) 145/69  Pulse: 75 76  Resp: 17 18  Temp:  37.4 C  SpO2: 94% 92%    Last Pain:  Vitals:   04/02/19 1111  TempSrc:   PainSc: 0-No pain                 Audry Pili

## 2019-04-02 NOTE — Interval H&P Note (Signed)
History and Physical Interval Note:  04/02/2019 8:14 AM  Martha Mora  has presented today for surgery, with the diagnosis of LEFT RENAL STONE.  The various methods of treatment have been discussed with the patient and family. After consideration of risks, benefits and other options for treatment, the patient has consented to  Procedure(s): CYSTOSCOPY WITH RETROGRADE URETHROGRAM/ URETEROSCOPY, HOLMIUM LASER LITHOTRIPSY/ STENT EXCHANGE (Left) as a surgical intervention.  The patient's history has been reviewed, patient examined, no change in status, stable for surgery.  I have reviewed the patient's chart and labs.  Questions were answered to the patient's satisfaction. She is feeling well and been on abx (tmp-smx) and antifungal (fluconazole).     Festus Aloe

## 2019-04-02 NOTE — Transfer of Care (Signed)
Immediate Anesthesia Transfer of Care Note  Patient: Martha Mora  Procedure(s) Performed: CYSTOSCOPY WITH LEFT RETROGRADE; BASKET EXTRACTION; LEFT  URETEROSCOPY, HOLMIUM LASER LITHOTRIPSY/ STENT EXCHANGE (Left )  Patient Location: PACU  Anesthesia Type:General  Level of Consciousness: awake, alert  and oriented  Airway & Oxygen Therapy: Patient Spontanous Breathing and Patient connected to face mask oxygen  Post-op Assessment: Report given to RN and Post -op Vital signs reviewed and stable  Post vital signs: Reviewed and stable  Last Vitals:  Vitals Value Taken Time  BP 183/141 04/02/2019 10:22 AM  Temp    Pulse 101 04/02/2019 10:23 AM  Resp 18 04/02/2019 10:23 AM  SpO2 100 % 04/02/2019 10:23 AM  Vitals shown include unvalidated device data.  Last Pain:  Vitals:   04/02/19 0635  TempSrc: Oral         Complications: No apparent anesthesia complications

## 2019-04-02 NOTE — Anesthesia Procedure Notes (Signed)
Procedure Name: LMA Insertion Date/Time: 04/02/2019 8:34 AM Performed by: Glory Buff, CRNA Pre-anesthesia Checklist: Patient identified, Emergency Drugs available, Suction available and Patient being monitored Patient Re-evaluated:Patient Re-evaluated prior to induction Oxygen Delivery Method: Circle system utilized Preoxygenation: Pre-oxygenation with 100% oxygen Induction Type: IV induction LMA: LMA inserted LMA Size: 4.0 Number of attempts: 1 Placement Confirmation: positive ETCO2 Tube secured with: Tape Dental Injury: Teeth and Oropharynx as per pre-operative assessment

## 2019-04-03 ENCOUNTER — Encounter (HOSPITAL_COMMUNITY): Payer: Self-pay | Admitting: Urology

## 2019-04-05 ENCOUNTER — Other Ambulatory Visit: Payer: Self-pay

## 2019-04-06 ENCOUNTER — Other Ambulatory Visit: Payer: Self-pay

## 2019-04-06 ENCOUNTER — Encounter (INDEPENDENT_AMBULATORY_CARE_PROVIDER_SITE_OTHER): Payer: Medicare Other | Admitting: Ophthalmology

## 2019-04-06 DIAGNOSIS — H353231 Exudative age-related macular degeneration, bilateral, with active choroidal neovascularization: Secondary | ICD-10-CM | POA: Diagnosis not present

## 2019-04-06 DIAGNOSIS — H43813 Vitreous degeneration, bilateral: Secondary | ICD-10-CM

## 2019-04-06 DIAGNOSIS — I10 Essential (primary) hypertension: Secondary | ICD-10-CM | POA: Diagnosis not present

## 2019-04-06 DIAGNOSIS — H33302 Unspecified retinal break, left eye: Secondary | ICD-10-CM

## 2019-04-06 DIAGNOSIS — H35033 Hypertensive retinopathy, bilateral: Secondary | ICD-10-CM | POA: Diagnosis not present

## 2019-04-06 MED ORDER — TRAZODONE HCL 50 MG PO TABS: 50.0000 mg | ORAL_TABLET | Freq: Every evening | ORAL | 0 refills | Status: DC | PRN

## 2019-04-07 ENCOUNTER — Telehealth: Payer: Self-pay | Admitting: *Deleted

## 2019-04-07 ENCOUNTER — Other Ambulatory Visit: Payer: Self-pay | Admitting: *Deleted

## 2019-04-07 DIAGNOSIS — J449 Chronic obstructive pulmonary disease, unspecified: Secondary | ICD-10-CM | POA: Diagnosis not present

## 2019-04-07 DIAGNOSIS — G4730 Sleep apnea, unspecified: Secondary | ICD-10-CM | POA: Diagnosis not present

## 2019-04-07 DIAGNOSIS — A4151 Sepsis due to Escherichia coli [E. coli]: Secondary | ICD-10-CM | POA: Diagnosis not present

## 2019-04-07 DIAGNOSIS — Z960 Presence of urogenital implants: Secondary | ICD-10-CM | POA: Diagnosis not present

## 2019-04-07 DIAGNOSIS — Z853 Personal history of malignant neoplasm of breast: Secondary | ICD-10-CM | POA: Diagnosis not present

## 2019-04-07 DIAGNOSIS — I5031 Acute diastolic (congestive) heart failure: Secondary | ICD-10-CM | POA: Diagnosis not present

## 2019-04-07 DIAGNOSIS — M479 Spondylosis, unspecified: Secondary | ICD-10-CM | POA: Diagnosis not present

## 2019-04-07 DIAGNOSIS — N136 Pyonephrosis: Secondary | ICD-10-CM | POA: Diagnosis not present

## 2019-04-07 DIAGNOSIS — M160 Bilateral primary osteoarthritis of hip: Secondary | ICD-10-CM | POA: Diagnosis not present

## 2019-04-07 DIAGNOSIS — I252 Old myocardial infarction: Secondary | ICD-10-CM | POA: Diagnosis not present

## 2019-04-07 DIAGNOSIS — I4891 Unspecified atrial fibrillation: Secondary | ICD-10-CM | POA: Diagnosis not present

## 2019-04-07 DIAGNOSIS — I110 Hypertensive heart disease with heart failure: Secondary | ICD-10-CM | POA: Diagnosis not present

## 2019-04-07 DIAGNOSIS — Z792 Long term (current) use of antibiotics: Secondary | ICD-10-CM | POA: Diagnosis not present

## 2019-04-07 DIAGNOSIS — M810 Age-related osteoporosis without current pathological fracture: Secondary | ICD-10-CM | POA: Diagnosis not present

## 2019-04-07 DIAGNOSIS — Z95828 Presence of other vascular implants and grafts: Secondary | ICD-10-CM | POA: Diagnosis not present

## 2019-04-07 DIAGNOSIS — I2510 Atherosclerotic heart disease of native coronary artery without angina pectoris: Secondary | ICD-10-CM | POA: Diagnosis not present

## 2019-04-07 DIAGNOSIS — Z7901 Long term (current) use of anticoagulants: Secondary | ICD-10-CM | POA: Diagnosis not present

## 2019-04-07 NOTE — Telephone Encounter (Signed)
Amy is requesting verbal orders to discharge patient from home PT on Friday as she has met all her goals. Christen Bame, CMA

## 2019-04-07 NOTE — Telephone Encounter (Signed)
Ok to give verbal orders?

## 2019-04-07 NOTE — Patient Outreach (Signed)
[place] [facility]) Care Management  04/07/2019  Martha Mora 11/17/1946 [id number]   Call placed to [facility] caregiver/daughter [name] to follow up on renal procedure.  No answer, unable to leave a voice message as mailbox is full.  Will await call back, if no call back will follow up within the next 3-4 business days.  Valente David, [place], MSN [place] [phone]

## 2019-04-08 NOTE — Telephone Encounter (Signed)
Verbal given. Christen Bame, CMA

## 2019-04-09 ENCOUNTER — Ambulatory Visit: Payer: Self-pay | Admitting: Pharmacist

## 2019-04-09 DIAGNOSIS — Z792 Long term (current) use of antibiotics: Secondary | ICD-10-CM | POA: Diagnosis not present

## 2019-04-09 DIAGNOSIS — G4730 Sleep apnea, unspecified: Secondary | ICD-10-CM | POA: Diagnosis not present

## 2019-04-09 DIAGNOSIS — Z7901 Long term (current) use of anticoagulants: Secondary | ICD-10-CM | POA: Diagnosis not present

## 2019-04-09 DIAGNOSIS — I2510 Atherosclerotic heart disease of native coronary artery without angina pectoris: Secondary | ICD-10-CM | POA: Diagnosis not present

## 2019-04-09 DIAGNOSIS — I110 Hypertensive heart disease with heart failure: Secondary | ICD-10-CM | POA: Diagnosis not present

## 2019-04-09 DIAGNOSIS — Z95828 Presence of other vascular implants and grafts: Secondary | ICD-10-CM | POA: Diagnosis not present

## 2019-04-09 DIAGNOSIS — M479 Spondylosis, unspecified: Secondary | ICD-10-CM | POA: Diagnosis not present

## 2019-04-09 DIAGNOSIS — I252 Old myocardial infarction: Secondary | ICD-10-CM | POA: Diagnosis not present

## 2019-04-09 DIAGNOSIS — J449 Chronic obstructive pulmonary disease, unspecified: Secondary | ICD-10-CM | POA: Diagnosis not present

## 2019-04-09 DIAGNOSIS — Z960 Presence of urogenital implants: Secondary | ICD-10-CM | POA: Diagnosis not present

## 2019-04-09 DIAGNOSIS — I4891 Unspecified atrial fibrillation: Secondary | ICD-10-CM | POA: Diagnosis not present

## 2019-04-09 DIAGNOSIS — M810 Age-related osteoporosis without current pathological fracture: Secondary | ICD-10-CM | POA: Diagnosis not present

## 2019-04-09 DIAGNOSIS — M160 Bilateral primary osteoarthritis of hip: Secondary | ICD-10-CM | POA: Diagnosis not present

## 2019-04-09 DIAGNOSIS — I5031 Acute diastolic (congestive) heart failure: Secondary | ICD-10-CM | POA: Diagnosis not present

## 2019-04-09 DIAGNOSIS — Z853 Personal history of malignant neoplasm of breast: Secondary | ICD-10-CM | POA: Diagnosis not present

## 2019-04-09 DIAGNOSIS — A4151 Sepsis due to Escherichia coli [E. coli]: Secondary | ICD-10-CM | POA: Diagnosis not present

## 2019-04-09 DIAGNOSIS — N136 Pyonephrosis: Secondary | ICD-10-CM | POA: Diagnosis not present

## 2019-04-13 ENCOUNTER — Other Ambulatory Visit: Payer: Self-pay | Admitting: Pharmacist

## 2019-04-13 ENCOUNTER — Ambulatory Visit: Payer: Self-pay | Admitting: Pharmacist

## 2019-04-13 ENCOUNTER — Other Ambulatory Visit: Payer: Self-pay | Admitting: *Deleted

## 2019-04-13 NOTE — Patient Outreach (Signed)
[place] [facility]) Care Management  04/13/2019  Alexismarie Flaim 1946-04-21 [id number]   Call placed to member's daughter to follow up regarding renal procedure.  She report procedure went well and member has been "good" since being discharged back home.  State she did have a slight fever the first day but denies fever or other complications since then.  This care manager informed daughter that [facility] pharmacist has been unsuccessful with outreach attempts.  Inquired about need for involvement, state member's eliquis is costly.  Provided daughter with pharmacist's contact information, encouraged to contact for assistance.  She denies any urgent concerns at this time, will follow up within the next month.  THN CM Care Plan Problem One     Most Recent Value  Role Documenting the Problem One  Care Management Coordinator  Care Plan for Problem One  Active  THN Long Term Goal   Member will not be readmitted to hospital within the next 45 days  THN Long Term Goal Start Date  03/17/19  Interventions for Problem One Long Term Goal  Discussed upcoming follow up appointments with urology and primary MD.  Educated on importance of close follow up in effort to decrease risk of readmission  THN CM Short Term Goal #1   Member will report decrease in anxiety and ability to wear CPAP through the night within the next 2 weeks  THN CM Short Term Goal #1 Start Date  03/17/19  [facility] CM Short Term Goal #1 Met Date  04/13/19  [facility] CM Short Term Goal #2   Daughter will report plan to afford Eliquis within the next 2 weeks  THN CM Short Term Goal #2 Start Date  04/13/19  Interventions for Short Term Goal #2  Provided daughter with contact information for [facility] pharmacist     Valente David, RN, MSN [place] Manager [phone]

## 2019-04-13 NOTE — Patient Outreach (Signed)
[name]) Care Management  04/13/2019  Martha Mora [birth year] [id number]   Patient was called regarding post discharge medication review. Unfortunately, she did not answer her phone. In addition, her caregiver [name]) was called. A message could not be left on Kerry's voice mail because it was full. A message was left on the patient's voicemail.  Plan: Close patient's case due to inability to contact. Alert [facility] Nurse Case Manager involved with the patient's care.   Elayne Guerin, PharmD, [place] Clinical Pharmacist [phone]

## 2019-04-14 ENCOUNTER — Other Ambulatory Visit: Payer: Self-pay | Admitting: Pharmacist

## 2019-04-14 DIAGNOSIS — N201 Calculus of ureter: Secondary | ICD-10-CM | POA: Diagnosis not present

## 2019-04-14 NOTE — Patient Outreach (Signed)
[place] [facility]) Care Management  04/14/2019  Martha Mora 03-Jan-1946 [id number]  Patient's daughter [name]) left a message from my call to her mother yesterday. I called her back today but she did not answer and her voicemail was full so a message could not be left for her.   Plan: Call patient's daughter back in 5-7 business days. Communicate with [facility] Nurse about attempting to reach the patient.  Elayne Guerin, PharmD, [place] Clinical Pharmacist [phone]

## 2019-04-19 ENCOUNTER — Telehealth: Payer: Self-pay | Admitting: *Deleted

## 2019-04-19 ENCOUNTER — Other Ambulatory Visit: Payer: Self-pay | Admitting: Hematology

## 2019-04-19 ENCOUNTER — Inpatient Hospital Stay: Payer: Medicare Other | Attending: Hematology

## 2019-04-19 ENCOUNTER — Inpatient Hospital Stay: Payer: Medicare Other

## 2019-04-19 ENCOUNTER — Other Ambulatory Visit: Payer: Self-pay

## 2019-04-19 VITALS — BP 92/58 | Temp 98.3°F | Resp 18

## 2019-04-19 DIAGNOSIS — C50511 Malignant neoplasm of lower-outer quadrant of right female breast: Secondary | ICD-10-CM

## 2019-04-19 DIAGNOSIS — M8580 Other specified disorders of bone density and structure, unspecified site: Secondary | ICD-10-CM | POA: Diagnosis not present

## 2019-04-19 DIAGNOSIS — Z853 Personal history of malignant neoplasm of breast: Secondary | ICD-10-CM | POA: Insufficient documentation

## 2019-04-19 DIAGNOSIS — Z170 Estrogen receptor positive status [ER+]: Secondary | ICD-10-CM

## 2019-04-19 LAB — CBC WITH DIFFERENTIAL/PLATELET
Abs Immature Granulocytes: 0.06 10*3/uL (ref 0.00–0.07)
Basophils Absolute: 0.1 10*3/uL (ref 0.0–0.1)
Basophils Relative: 1 %
Eosinophils Absolute: 0.5 10*3/uL (ref 0.0–0.5)
Eosinophils Relative: 4 %
HCT: 31.8 % — ABNORMAL LOW (ref 36.0–46.0)
Hemoglobin: 9.3 g/dL — ABNORMAL LOW (ref 12.0–15.0)
Immature Granulocytes: 1 %
Lymphocytes Relative: 19 %
Lymphs Abs: 2.2 10*3/uL (ref 0.7–4.0)
MCH: 25.5 pg — ABNORMAL LOW (ref 26.0–34.0)
MCHC: 29.2 g/dL — ABNORMAL LOW (ref 30.0–36.0)
MCV: 87.1 fL (ref 80.0–100.0)
Monocytes Absolute: 0.6 10*3/uL (ref 0.1–1.0)
Monocytes Relative: 5 %
Neutro Abs: 8.4 10*3/uL — ABNORMAL HIGH (ref 1.7–7.7)
Neutrophils Relative %: 70 %
Platelets: 381 10*3/uL (ref 150–400)
RBC: 3.65 MIL/uL — ABNORMAL LOW (ref 3.87–5.11)
RDW: 16.6 % — ABNORMAL HIGH (ref 11.5–15.5)
WBC: 11.9 10*3/uL — ABNORMAL HIGH (ref 4.0–10.5)
nRBC: 0 % (ref 0.0–0.2)

## 2019-04-19 LAB — CMP (CANCER CENTER ONLY)
ALT: 24 U/L (ref 0–44)
AST: 27 U/L (ref 15–41)
Albumin: 2.8 g/dL — ABNORMAL LOW (ref 3.5–5.0)
Alkaline Phosphatase: 128 U/L — ABNORMAL HIGH (ref 38–126)
Anion gap: 9 (ref 5–15)
BUN: 15 mg/dL (ref 8–23)
CO2: 25 mmol/L (ref 22–32)
Calcium: 9 mg/dL (ref 8.9–10.3)
Chloride: 106 mmol/L (ref 98–111)
Creatinine: 1.19 mg/dL — ABNORMAL HIGH (ref 0.44–1.00)
GFR, Est AFR Am: 52 mL/min — ABNORMAL LOW (ref >60)
GFR, Estimated: 45 mL/min — ABNORMAL LOW (ref >60)
Glucose, Bld: 146 mg/dL — ABNORMAL HIGH (ref 70–99)
Potassium: 4.1 mmol/L (ref 3.5–5.1)
Sodium: 140 mmol/L (ref 135–145)
Total Bilirubin: 0.3 mg/dL (ref 0.3–1.2)
Total Protein: 6.3 g/dL — ABNORMAL LOW (ref 6.5–8.1)

## 2019-04-19 MED ORDER — DENOSUMAB 60 MG/ML ~~LOC~~ SOSY
PREFILLED_SYRINGE | SUBCUTANEOUS | Status: AC
  Filled 2019-04-19: qty 1

## 2019-04-19 MED ORDER — DENOSUMAB 60 MG/ML ~~LOC~~ SOSY
60.0000 mg | PREFILLED_SYRINGE | Freq: Once | SUBCUTANEOUS | Status: AC
  Administered 2019-04-19: 60 mg via SUBCUTANEOUS

## 2019-04-19 MED ORDER — DENOSUMAB 60 MG/ML ~~LOC~~ SOSY: 60.0000 mg | PREFILLED_SYRINGE | Freq: Once | SUBCUTANEOUS | 0 refills | Status: DC

## 2019-04-19 NOTE — Patient Instructions (Signed)

## 2019-04-19 NOTE — Telephone Encounter (Signed)
Pt wants to know what she can do for her "restless leg syndrome during the day".  Will forward to MD. Christen Bame, CMA

## 2019-04-19 NOTE — Telephone Encounter (Signed)
She should be seen for an appointment since we've tried to work through this over the phone in the past.

## 2019-04-20 NOTE — Telephone Encounter (Signed)
Spoke with pt made an appt  For 6/10 at 9:30 to discuss Restless legs Syndrome. Salvatore Marvel, CMA

## 2019-04-21 ENCOUNTER — Telehealth: Payer: Self-pay | Admitting: *Deleted

## 2019-04-21 NOTE — Telephone Encounter (Signed)
-----   Message from Truitt Merle, MD sent at 04/20/2019  6:26 PM EDT ----- Please let pt know her lab results, mild anemia and CKD overall stable, protein level slightly low, increase protein intake, no other concerns, thanks   Truitt Merle  04/20/2019

## 2019-04-21 NOTE — Telephone Encounter (Signed)
Called pt per Dr Ernestina Penna instructions & informed of mild anemia & slightly low protein.  Suggested foods high in iron/protein. Also informed of kidney function & to make sure she is getting enough fluids.  She reports that she had a stent put in after kidney stones recently but now OK & stent removed.  She expressed appreciation for call.

## 2019-04-22 ENCOUNTER — Other Ambulatory Visit: Payer: Self-pay | Admitting: Pharmacist

## 2019-04-22 NOTE — Patient Outreach (Signed)
Martha Mora) Care Management  [place]   04/22/2019  Martha Mora 1946-07-09 [id number]  Reason for referral: medication assistance  Referral source: [place] Nurse Referral medication(s): Eliquis Current insurance:[employer]  HPI:  Anxiety, history of breast cancer, coronary atherosclerosis, depression, hypertension, hemorrhoids, GERD, hyperlipidemia, mitral valve regurgitation, osteoarthritis, osteopenia, RLS, OSA, and stress incontinence.   Objective: Allergies  Allergen Reactions  . Keflex [Cephalexin] Other (See Comments)    "Made me feel funny"  . Scallops [Shellfish Allergy] Nausea And Vomiting  . Penicillins Rash and Other (See Comments)    03/01/19 tolerated Zosyn Red bumps all over stomach Did it involve swelling of the face/tongue/throat, SOB, or low BP? No Did it involve sudden or severe rash/hives, skin peeling, or any reaction on the inside of your mouth or nose? Yes Did you need to seek medical attention at a [facility]a or doctor's office? Yes When did it last happen?30+ years  If all above answers are "NO", may proceed with cephalosporin use.     Medications Reviewed Today    Reviewed by Glory Buff, CRNA (Certified Registered Nurse Anesthetist) on 04/02/19 at [phone]  Med List Status: Complete  Medication Order Taking? Sig Documenting Provider Last Dose Status Informant  acetaminophen (TYLENOL) 500 MG tablet [id number] Yes Take 500 mg by mouth every 6 (six) hours as needed for mild pain, fever or headache.  [provider] 04/01/2019 Unknown time Active Self           Med Note Duffy Bruce, KATHY N   Sun Feb 21, 2019  3:41 PM)    albuterol (VENTOLIN HFA) 108 (90 Base) MCG/ACT inhaler [id number] Yes 1-2 inhalations every 4-6 hours as needed for wheezing. Dispense spacer as needed.  Patient taking differently:  Inhale 2 puffs into the lungs every 6 (six) hours as needed for wheezing or shortness of breath.     [name], MD Past Month Unknown time Active Self  amiodarone (PACERONE) 200 MG tablet [id number] Yes TAKE 1 TABLET BY MOUTH 2 TIMES DAILY. UNTIL 4/26, THEN TAKE 1 TABLET ONCE DAILY.  Patient taking differently:  Take 200 mg by mouth every evening.    [name], DO 04/02/2019 0530 Active Self  Calcium Carb-Cholecalciferol (CALCIUM 600+D) 600-800 MG-UNIT TABS [id number] Yes Take 1 tablet by mouth 2 (two) times a day. [provider] Past Week Unknown time Active Self  citalopram (CELEXA) 40 MG tablet [id number] Yes Take 0.5 tablets (20 mg total) by mouth 2 (two) times daily.  Patient taking differently:  Take 40 mg by mouth daily.    [name], MD 04/02/2019 0530 Active Self  diclofenac (VOLTAREN) 50 MG EC tablet [id number] Yes Take 50 mg by mouth at bedtime. [provider] Past Week Unknown time Active Self           Med Note Jilda Roche A   Tue Mar 30, 2019  8:29 AM) Has 2 different strengths of diclofenac   diclofenac (VOLTAREN) 75 MG EC tablet [id number] Yes Take 75 mg by mouth daily. [provider] Past Week Unknown time Active Self           Med Note Jilda Roche A   Tue Mar 30, 2019  8:30 AM) Has 2 different strengths of diclofenac   ELIQUIS 5 MG TABS tablet [id number] Yes TAKE 1 TABLET BY MOUTH TWICE A DAY [name], DO 04/01/2019 Unknown time Active   fluconazole (DIFLUCAN) 150 MG tablet [id number] Yes Take 150 mg  by mouth daily. [provider] 04/01/2019 Unknown time Active Self           Med Note Caryn Section, KYLE A   Tue Mar 30, 2019  8:20 AM) Started 5/10 should finish on 5/20  furosemide (LASIX) 40 MG tablet [id number] Yes Take 1 tablet (40 mg total) by mouth daily.  Patient taking differently:  Take 40 mg by mouth every evening.    [name], [name], DO 04/01/2019 Unknown time Active Self  losartan (COZAAR) 100 MG tablet [id number] Yes Take 100 mg by mouth daily.  [provider] 04/01/2019 Unknown time Active Self            Med Note SOBERANES, JEFFREY W   Sat Nov 04, 2015  1:12 AM)    Multiple Vitamin (MULTIVITAMIN) tablet [id number] Yes Take 1 tablet by mouth daily. Herbal Life multivitamin [provider] Past Week Unknown time Active Self  nicotine (NICODERM CQ - DOSED IN MG/24 HOURS) 21 mg/24hr patch [id number] No Place 1 patch (21 mg total) onto the skin daily.  Patient not taking:  Reported on 03/30/2019   [name], [name], DO Not Taking Unknown time Consider Medication Status and Discontinue Self  Omega-3 Fatty Acids (FISH OIL) 500 MG CAPS [id number] Yes Take 500 mg by mouth daily. [provider] Past Week Unknown time Active Self  omeprazole (PRILOSEC) 40 MG capsule [id number] Yes TAKE 1 CAPSULE (40 MG TOTAL) BY MOUTH DAILY.  Patient taking differently:  Take 40 mg by mouth at bedtime.    [name], MD 04/02/2019 0530 Active Self  oxybutynin (DITROPAN) 5 MG tablet [id number]  Take 1 tablet (5 mg total) by mouth daily. [name], DO  Active Self           Med Note Caryn Section, Marylyn Ishihara A   Tue Mar 30, 2019  8:32 AM) Pt put on hold while taking lasix  Probiotic CAPS [id number] Yes Take 2 capsules by mouth daily. [provider] Past Week Unknown time Active Self           Med Note Jilda Roche A   Tue Mar 30, 2019  8:38 AM) On hold for procedure  PROLIA 60 MG/ML SOSY injection [id number] Yes Inject 60 mg into the skin every 6 (six) months.  [provider]  Active Self           Med Note Caryn Section, Marylyn Ishihara A   Tue Mar 30, 2019  8:34 AM) Last dose was in September, due to covid pt has plans to get it on 6/1  rOPINIRole (REQUIP) 3 MG tablet [id number] Yes Take 1 tablet (3 mg total) by mouth at bedtime. [name], DO 04/01/2019 Unknown time Active Self  rosuvastatin (CRESTOR) 20 MG tablet [id number] Yes Take 20 mg by mouth at bedtime.  [name], MD 04/01/2019 Unknown time Active Self  sulfamethoxazole-trimethoprim (BACTRIM DS) 800-160 MG tablet [id number] Yes Take 1 tablet  by mouth 2 (two) times daily. [provider] 04/01/2019 Unknown time Active Self           Med Note Caryn Section, KYLE A   Tue Mar 30, 2019  8:22 AM) Started 5/10 should finish on 5/20  traMADol (ULTRAM) 50 MG tablet [id number] Yes Take 50 mg by mouth at bedtime as needed for moderate pain. [provider] 04/01/2019 Unknown time Active Self  traZODone (DESYREL) 50 MG tablet [id number] Yes TAKE 1 TABLET (50 MG TOTAL) BY MOUTH AT BEDTIME AS NEEDED FOR SLEEP.  Patient taking differently:  Take 50 mg by mouth at bedtime.    [name], DO 04/01/2019 Unknown time Active Self  trimethoprim (TRIMPEX) 100 MG tablet [id number]  Take 1 tablet (100 mg total) by mouth daily. USE AFTER YOU FINISH 14 days of BACTRIM. [name], [name], DO  Active Self           Med Note Caryn Section, [place] A   Tue Mar 30, 2019  8:30 AM) On hold due to taking bactrim  Wheat Dextrin (BENEFIBER) POWD [id number]  Take 1 Dose by mouth 3 (three) times daily. [provider]  Active Self           Med Note Caryn Section, Marylyn Ishihara A   Tue Mar 30, 2019  8:38 AM) On hold for procedure          Assessment:  Drugs sorted by system:  Neurologic/Psychologic: Citalopram, Ropinirole, Trazodone,   Cardiovascular: Amiodarone, Losartan, Rosuvastatin, Eliquis  Pulmonary/Allergy: Albuterol HFA,   Gastrointestinal: Omeprazole, Benefiber  Endocrine: Prolia  Pain: Acetaminophen, diclofenac, Probiotic  Infectious Diseases: Trimethoprim  Genitourinary: Oxybutynin (unsure if currently taking last filled in February)  Vitamins/Minerals/Supplements: Calcium Carbonate, Multiple Vitamin, Omega 3 Fatty Acids,    Medication Assistance Findings:  Medication assistance needs identified: Eliqus  Unclear if patient has met the out-of-pocket expense requirement. Patient's daughter will be looking for the most recent EOB from the patient's insurance.  Extra Help Application completed with the patient over the  phone.   Plan: I will route patient assistance letter to [place] technician who will coordinate patient assistance program application process for medications listed above.  [facility] pharmacy technician will assist with obtaining all required documents from both patient and provider(s) and submit application(s) once completed.    Elayne Guerin, PharmD, [place] Clinical Pharmacist [phone]

## 2019-04-26 ENCOUNTER — Other Ambulatory Visit: Payer: Self-pay | Admitting: Family Medicine

## 2019-04-26 ENCOUNTER — Telehealth: Payer: Self-pay

## 2019-04-26 MED ORDER — PRAMIPEXOLE DIHYDROCHLORIDE 0.125 MG PO TABS: 0.1250 mg | ORAL_TABLET | Freq: Every evening | ORAL | 0 refills | Status: DC | PRN

## 2019-04-26 NOTE — Telephone Encounter (Signed)
Changed ropinirole to pramipexole. She can increase the dose to 2 tablets after 2 weeks if not effective.

## 2019-04-26 NOTE — Telephone Encounter (Signed)
Pt calling get help with restless leg. Pt has an appt this week but is not sleeping more than 2 hours a night. Current medication is not working anymore. Pt would like [name] to call in something different. Restless leg is now more frequent during the day. Please send to [facility] [place]. Ottis Stain, CMA

## 2019-04-26 NOTE — Telephone Encounter (Signed)
Spoke with pt and informed her of the note that was left by [name]. Pt understood. Salvatore Marvel, CMA

## 2019-04-27 ENCOUNTER — Other Ambulatory Visit: Payer: Self-pay | Admitting: Pharmacy Technician

## 2019-04-27 NOTE — Patient Outreach (Signed)
[place] [facility]) Care Management  04/27/2019  Martha Mora 1946/02/19 [id number]                           Medication Assistance Referral  Referral From: [place]  Medication/Company: Eliquis / BMS Patient application portion:  Mailed Provider application portion: Faxed  to Dr. [name]    Follow up:  Will follow up with patient in 5-10 business days to confirm application(s) have been received.  [name] P. Bonetta Mostek, [place] Management [phone]

## 2019-04-28 ENCOUNTER — Emergency Department (HOSPITAL_COMMUNITY): Payer: Medicare Other

## 2019-04-28 ENCOUNTER — Encounter (HOSPITAL_COMMUNITY): Payer: Self-pay | Admitting: Emergency Medicine

## 2019-04-28 ENCOUNTER — Ambulatory Visit (INDEPENDENT_AMBULATORY_CARE_PROVIDER_SITE_OTHER): Payer: Medicare Other | Admitting: Family Medicine

## 2019-04-28 ENCOUNTER — Emergency Department (HOSPITAL_COMMUNITY)
Admission: EM | Admit: 2019-04-28 | Discharge: 2019-04-29 | Disposition: A | Discharge status: Home / Self Care | Payer: Medicare Other | Attending: Emergency Medicine | Admitting: Emergency Medicine

## 2019-04-28 ENCOUNTER — Encounter: Payer: Self-pay | Admitting: Family Medicine

## 2019-04-28 ENCOUNTER — Other Ambulatory Visit: Payer: Self-pay

## 2019-04-28 VITALS — BP 130/60 | HR 65 | Ht 61.0 in | Wt 211.4 lb

## 2019-04-28 DIAGNOSIS — M48061 Spinal stenosis, lumbar region without neurogenic claudication: Secondary | ICD-10-CM | POA: Diagnosis not present

## 2019-04-28 DIAGNOSIS — M549 Dorsalgia, unspecified: Secondary | ICD-10-CM

## 2019-04-28 DIAGNOSIS — R52 Pain, unspecified: Secondary | ICD-10-CM

## 2019-04-28 DIAGNOSIS — Z79899 Other long term (current) drug therapy: Secondary | ICD-10-CM | POA: Diagnosis not present

## 2019-04-28 DIAGNOSIS — Z85828 Personal history of other malignant neoplasm of skin: Secondary | ICD-10-CM | POA: Diagnosis not present

## 2019-04-28 DIAGNOSIS — Z87891 Personal history of nicotine dependence: Secondary | ICD-10-CM | POA: Insufficient documentation

## 2019-04-28 DIAGNOSIS — M5126 Other intervertebral disc displacement, lumbar region: Secondary | ICD-10-CM | POA: Diagnosis not present

## 2019-04-28 DIAGNOSIS — M5432 Sciatica, left side: Secondary | ICD-10-CM

## 2019-04-28 DIAGNOSIS — Z853 Personal history of malignant neoplasm of breast: Secondary | ICD-10-CM | POA: Diagnosis not present

## 2019-04-28 DIAGNOSIS — Z7901 Long term (current) use of anticoagulants: Secondary | ICD-10-CM | POA: Diagnosis not present

## 2019-04-28 DIAGNOSIS — G4733 Obstructive sleep apnea (adult) (pediatric): Secondary | ICD-10-CM | POA: Diagnosis not present

## 2019-04-28 DIAGNOSIS — R109 Unspecified abdominal pain: Secondary | ICD-10-CM | POA: Insufficient documentation

## 2019-04-28 DIAGNOSIS — N390 Urinary tract infection, site not specified: Secondary | ICD-10-CM | POA: Insufficient documentation

## 2019-04-28 DIAGNOSIS — M5442 Lumbago with sciatica, left side: Secondary | ICD-10-CM | POA: Diagnosis not present

## 2019-04-28 DIAGNOSIS — R0902 Hypoxemia: Secondary | ICD-10-CM | POA: Diagnosis not present

## 2019-04-28 DIAGNOSIS — G2581 Restless legs syndrome: Secondary | ICD-10-CM | POA: Diagnosis not present

## 2019-04-28 DIAGNOSIS — I10 Essential (primary) hypertension: Secondary | ICD-10-CM | POA: Diagnosis not present

## 2019-04-28 DIAGNOSIS — J449 Chronic obstructive pulmonary disease, unspecified: Secondary | ICD-10-CM | POA: Diagnosis not present

## 2019-04-28 DIAGNOSIS — K5730 Diverticulosis of large intestine without perforation or abscess without bleeding: Secondary | ICD-10-CM | POA: Diagnosis not present

## 2019-04-28 DIAGNOSIS — N200 Calculus of kidney: Secondary | ICD-10-CM | POA: Diagnosis not present

## 2019-04-28 DIAGNOSIS — I2510 Atherosclerotic heart disease of native coronary artery without angina pectoris: Secondary | ICD-10-CM | POA: Diagnosis not present

## 2019-04-28 DIAGNOSIS — R110 Nausea: Secondary | ICD-10-CM | POA: Diagnosis not present

## 2019-04-28 MED ORDER — ACETAMINOPHEN 500 MG PO TABS
1000.0000 mg | ORAL_TABLET | Freq: Once | ORAL | Status: AC
  Administered 2019-04-28: 1000 mg via ORAL
  Filled 2019-04-28: qty 2

## 2019-04-28 MED ORDER — ROPINIROLE HCL 4 MG PO TABS: 4.0000 mg | ORAL_TABLET | Freq: Every day | ORAL | 0 refills | Status: DC

## 2019-04-28 MED ORDER — KETOROLAC TROMETHAMINE 60 MG/2ML IM SOLN
15.0000 mg | Freq: Once | INTRAMUSCULAR | Status: AC
  Administered 2019-04-28: 15 mg via INTRAMUSCULAR
  Filled 2019-04-28: qty 2

## 2019-04-28 MED ORDER — DIAZEPAM 2 MG PO TABS
2.0000 mg | ORAL_TABLET | Freq: Once | ORAL | Status: AC
  Administered 2019-04-28: 2 mg via ORAL
  Filled 2019-04-28: qty 1

## 2019-04-28 MED ORDER — DIAZEPAM 2 MG PO TABS: 2.0000 mg | ORAL_TABLET | Freq: Two times a day (BID) | ORAL | 0 refills | Status: DC

## 2019-04-28 MED ORDER — OXYCODONE HCL 5 MG PO TABS
2.5000 mg | ORAL_TABLET | Freq: Once | ORAL | Status: AC
  Administered 2019-04-28: 2.5 mg via ORAL
  Filled 2019-04-28: qty 1

## 2019-04-28 NOTE — Assessment & Plan Note (Signed)
Not compliant with CPAP. Desats noted during sleep during hospitalization. Counseling provided regarding compliance.

## 2019-04-28 NOTE — ED Triage Notes (Signed)
Arrives via EMS from home. C/C restless leg syndrome, hx of same. Patient called her doctor today and upped her dosage of Ropinirole from 3mg  to 4mg . Patient states this medication makes her feel nauseous, no vomiting at this time.

## 2019-04-28 NOTE — ED Notes (Signed)
Patient transported to CT 

## 2019-04-28 NOTE — Progress Notes (Signed)
Subjective:   Patient ID: Martha Mora    DOB: 05/12/1946, 73 y.o. female   MRN: [id number]  Alaycia Eardley is a 73 y.o. female with a history of HTN, CAD, MVR, sleep apnea, GERD, IBS, diverticulosis, OA, obesity, HLD, anxiety, h/o breast cancer, RLS, mixed incontinence, insomnia here for   Restless leg syndrome - she has had issues with restless legs for years but worse recently since d/c from hospital.  - doesn't fall asleep until 3-4am, up by 630 or 7. States only on L side, won't stop moving.  - worse at night. During the day it feels like "her insides are shaking." - previously on ropinorole 3mg  but not seeing much effect. Changed to pramipexole 6/8 but hasn't seen effect with this either. Started taking pramipexole and 1/2 tablet of ropinirole.  - also on trazodone qhs - previously checked Hb 9.3 6/1, stable from prior. Started iron supplementation 2 days ago. - has h/o OSA but hasn't been able to wear CPAP as she feels this makes her RLS worse.  - has h/o anxiety but hasn't had any panic attacks lately. Feels like her restlessness is more related to her RLS. Does get irritated easily with grandchildren running around the house. Sometimes has difficulties concentrating. - a few days after ureteral stent exchange, patient had irritation from it and removed it with urology approval. - has appt with Cardiology 6/18.   Review of Systems:  Per HPI.  [place], medications and smoking status reviewed.  Objective:   BP 130/60   Pulse 65   Ht 5\' 1"  (1.549 m)   Wt 211 lb 6 oz (95.9 kg)   SpO2 97%   BMI 39.94 kg/m  Vitals and nursing note reviewed.  General: obese female, in no acute distress with non-toxic appearance CV: regular rate and rhythm without murmurs, rubs, or gallops, no lower extremity edema Lungs: clear to auscultation bilaterally with normal work of breathing Skin: warm, dry, no rashes or lesions Extremities: warm and well perfused, normal tone MSK: Full ROM,  strength 5/5 to U/LE bilaterally, normal gait.  No edema.  Neuro: Alert and oriented, speech normal. PERRL, Extraocular movements intact.  Hearing grossly intact bilaterally.  Tongue protrudes normally with no deviation.  Shoulder shrug, smile symmetric. Finger to nose normal.  GAD 7 : Generalized Anxiety Score 04/28/2019  Nervous, Anxious, on Edge 3  Control/stop worrying 0  Worry too much - different things 0  Trouble relaxing 3  Restless 3  Easily annoyed or irritable 3  Afraid - awful might happen 0  Total GAD 7 Score 12  Anxiety Difficulty Not difficult at all    Assessment & Plan:   Restless leg syndrome Has longstanding history, but recent worsening since d/c from hospital and likely has multiple contributing factors. GAD elevated to 12 today but only in restlessness, trouble relaxing, nervous or on edge. Scored 0 for worrying and afraid. Suspect GAD score may be falsely elevated and indeed related to RLS instead of true anxiety as pt reports her anxiety is improved from prior. Hb checked last week, low but stable from prior, just started iron 2 days ago. Kidney function also improving. Not compliant with CPAP, suspect this is also playing a role. Will increase requip to 4mg  and suggest magnesium supplementation and f/u in one month, at that time will repeat labs and obtain iron panel. Will then also reassess sleep hygiene and revisit CPAP compliance.  No orders of the defined types were placed in this encounter.  Meds ordered this encounter  Medications  . DISCONTD: rOPINIRole (REQUIP) 4 MG tablet    Sig: Take 1 tablet (4 mg total) by mouth at bedtime.    Dispense:  30 tablet    Refill:  0  . rOPINIRole (REQUIP) 4 MG tablet    Sig: Take 1 tablet (4 mg total) by mouth at bedtime.    Dispense:  30 tablet    Refill:  0    [name], DO PGY-2, [place] Medicine 04/28/2019 12:49 PM

## 2019-04-28 NOTE — ED Notes (Signed)
Pt is aware a urine specimen is needed and will call staff when able to void.

## 2019-04-28 NOTE — Discharge Instructions (Addendum)
Be careful with the muscle relaxant.  This can make you confused or cause you to fall.   Your back pain is most likely due to a muscular strain.  There is been a lot of research on back pain, unfortunately the only thing that seems to really help is Tylenol and ibuprofen.  Relative rest is also important to not lift greater than 10 pounds bending or twisting at the waist.  Please follow-up with your family physician.  The other thing that really seems to benefit patients is physical therapy which your doctor may send you for.  Please return to the emergency department for new numbness or weakness to your arms or legs. Difficulty with urinating or urinating or pooping on yourself.  Also if you cannot feel toilet paper when you wipe or get a fever.   Also take tylenol 1000mg (2 extra strength) four times a day.

## 2019-04-28 NOTE — Assessment & Plan Note (Signed)
Has longstanding history, but recent worsening since d/c from hospital and likely has multiple contributing factors. GAD elevated to 12 today but only in restlessness, trouble relaxing, nervous or on edge. Scored 0 for worrying and afraid. Suspect GAD score may be falsely elevated and indeed related to RLS instead of true anxiety as pt reports her anxiety is improved from prior. Hb checked last week, low but stable from prior, just started iron 2 days ago. Kidney function also improving. Not compliant with CPAP, suspect this is also playing a role. Will increase requip to 4mg  and suggest magnesium supplementation and f/u in one month, at that time will repeat labs and obtain iron panel. Will then also reassess sleep hygiene and revisit CPAP compliance.
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
## 2019-04-28 NOTE — Assessment & Plan Note (Signed)
At goal. No changes made today. 

## 2019-04-28 NOTE — ED Notes (Signed)
Patient ambulated to restroom with minimal assistance. Patient states that pain is completely gone at this present time. Will continue to monitor patient.

## 2019-04-28 NOTE — Patient Instructions (Signed)
It was great to see you!  Our plans for today:  - We increased your ropinirole, do not take this with pramipexole. - Take magnesium OTC before you go to bed at night. - Come back in one month and we'll likely check labs at that point.  Take care and seek immediate care sooner if you develop any concerns.   Dr. [facility]

## 2019-04-28 NOTE — ED Provider Notes (Signed)
Care assumed from Dr. Tyrone Nine at shift change. See his note for full H&P.   Her his note,  "73 yo F with a chief complaint worsening restless leg syndrome.  Patient has had this for some time.  Got worse when she was hospitalized back in April with pneumonia and a urinary tract infection.  She has had problems with her low back before and has had spinal injections with some improvement.  She is having worsening left-sided low back pain that radiates down the leg.  Feels that she is having trouble finding a comfortable position.  Recently also had a procedure for left sided kidney stone.  She thinks this feels different than her prior kidney stone.  Denies abdominal pain.  Had a subjective fever last night but has resolved.  She denies loss of bowel or bladder denies loss of peritoneal sensation denies numbness to the leg she does feel the left leg may be weaker than the right.  She denies recent surgery or injection to her low back.  She saw her family doctor today and had her ropinirole increased.  Since she took that she felt like it is even gotten worse."  Plan: F/u CT renal and lumbar and UA. She will call spine surgeon tomorrow. Likely d/c if scans negative.    Physical Exam  BP (!) 176/74   Pulse 78   Temp 99.3 F (37.4 C) (Oral)   Resp 18   SpO2 95%   Physical Exam Vitals signs and nursing note reviewed.  Constitutional:      General: She is not in acute distress.    Appearance: She is well-developed.  HENT:     Head: Normocephalic and atraumatic.  Eyes:     Conjunctiva/sclera: Conjunctivae normal.  Neck:     Musculoskeletal: Neck supple.  Cardiovascular:     Rate and Rhythm: Normal rate.  Pulmonary:     Effort: Pulmonary effort is normal.  Musculoskeletal: Normal range of motion.  Skin:    General: Skin is warm and dry.  Neurological:     Mental Status: She is alert.     Comments: Moving all extremities with normal coordination.     ED Course/Procedures     Procedures   Results for orders placed or performed during the hospital encounter of 04/28/19  Urinalysis, Routine w reflex microscopic  Result Value Ref Range   Color, Urine YELLOW YELLOW   APPearance HAZY (A) CLEAR   Specific Gravity, Urine 1.020 1.005 - 1.030   pH 5.0 5.0 - 8.0   Glucose, UA NEGATIVE NEGATIVE mg/dL   Hgb urine dipstick NEGATIVE NEGATIVE   Bilirubin Urine NEGATIVE NEGATIVE   Ketones, ur NEGATIVE NEGATIVE mg/dL   Protein, ur NEGATIVE NEGATIVE mg/dL   Nitrite NEGATIVE NEGATIVE   Leukocytes,Ua LARGE (A) NEGATIVE   RBC / HPF 21-50 0 - 5 RBC/hpf   WBC, UA 21-50 0 - 5 WBC/hpf   Bacteria, UA NONE SEEN NONE SEEN   Squamous Epithelial / LPF 0-5 0 - 5   Mucus PRESENT    Ct L-spine No Charge  Result Date: 04/28/2019 CLINICAL DATA:  Initial evaluation for acute flank pain, stone disease suspected. EXAM: CT ABDOMEN AND PELVIS WITHOUT CONTRAST CT LUMBAR SPINE WITHOUT CONTRAST TECHNIQUE: Multidetector CT imaging of the abdomen and pelvis was performed following the standard protocol without IV contrast. COMPARISON:  None. FINDINGS: CT ABDOMEN AND PELVIS: Lower chest: Visualized lung bases are clear. Mild cardiomegaly partially visualized. Scattered mitral aortic valvular calcifications noted. Hepatobiliary: Liver  demonstrates a normal unenhanced appearance. Gallbladder surgically absent. No biliary dilatation. Pancreas: Pancreas within normal limits. Spleen: Spleen within normal limits. Adrenals/Urinary Tract: Adrenal glands are normal. Kidneys equal in size bilaterally. Right kidney within normal limits without nephrolithiasis or obstructive uropathy. No radiopaque calculi seen along the course of the right renal collecting system. No right-sided hydroureter. On the left, multiple scattered nonobstructive calculi present within the left kidney, largest of which seen at the lower pole and measures up to 15 mm. No obstructive radiopaque calculi seen along the course of the left renal collecting system.  No left-sided hydronephrosis or hydroureter. No appreciable focal renal lesions seen on this noncontrast examination. Bladder largely decompressed without acute finding. No layering stones within the bladder lumen. Stomach/Bowel: Small hiatal hernia noted. Stomach otherwise unremarkable. No evidence for bowel obstruction. Appendix is surgically absent. Colonic diverticulosis without evidence for acute diverticulitis. No acute inflammatory changes seen about the bowels. Vascular/Lymphatic: Advanced atherosclerotic change seen throughout the intra-abdominal aorta and its branch vessels. No aneurysm. No adenopathy. Reproductive: Uterus is absent.  Ovaries not confidently identified. Other: No free air or fluid. Small fat containing paraumbilical hernia without associated inflammation. Musculoskeletal: Fixation screw in place at the proximal left femur. No acute osseous abnormality. No discrete lytic or blastic osseous lesions. CT LUMBAR SPINE: Normal segmentation. Lowest well-formed disc labeled the L5-S1 level. Moderate left convex scoliosis, apex at L1-2. 5 mm anterolisthesis of L5 on S1, with trace retrolisthesis of L2 on L3 and L3 on L4. Findings likely chronic and facet mediated. Vertebral body heights maintained without evidence for acute or chronic fracture. Visualized sacrum and pelvis intact. SI joints approximated. No discrete or worrisome osseous lesions. Paraspinous soft tissues demonstrate no acute finding. T12-L1: Chronic intervertebral disc space narrowing with disc desiccation. Chronic reactive endplate changes, greater on the right. Mild facet hypertrophy. No significant spinal stenosis. Mild-to-moderate right neural foraminal narrowing. L1-2: Chronic intervertebral disc space narrowing with disc desiccation. Right far lateral reactive endplate changes with marginal endplate osteophytic spurring. Mild to moderate facet hypertrophy. No significant spinal stenosis. Moderate right foraminal narrowing.  L2-3: Chronic intervertebral disc space narrowing with diffuse disc bulge and disc desiccation. Right far lateral reactive endplate changes with marginal endplate osteophytic spurring. Moderate bilateral facet hypertrophy. Resultant mild canal with mild to moderate right lateral recess narrowing. Moderate right L2 foraminal stenosis. L3-4: Chronic intervertebral disc space narrowing with disc desiccation and diffuse disc bulge. Reactive endplate changes. Moderate facet hypertrophy. Prior posterior decompression. Mild canal with mild to moderate lateral recess narrowing. Moderate to advanced right with mild to moderate left L3 foraminal stenosis. L4-5: Chronic intervertebral disc space narrowing with diffuse disc bulge and disc desiccation. Reactive endplate changes. Severe left with moderate right facet arthrosis. Prior posterior decompression. Central canal patent. Probable mild residual lateral recess stenosis. Severe left L4 foraminal stenosis. Right neural foramen patent. L5-S1: Anterolisthesis. Chronic intervertebral disc spacing with disc desiccation and diffuse disc bulge. Severe bilateral facet arthrosis. Prior posterior decompression. Residual mild to moderate bilateral lateral recess narrowing, largely due to facet degeneration, greater on the left. Moderate left with mild right L5 foraminal stenosis. IMPRESSION: CT ABDOMEN AND PELVIS IMPRESSION: 1. Nonobstructive left renal nephrolithiasis as above. No CT evidence for obstructive uropathy. 2. No other acute intra-abdominal or pelvic process. 3. Colonic diverticulosis without evidence for acute diverticulitis. 4. Small hiatal hernia. 5. Sequelae of prior cholecystectomy, appendectomy, and hysterectomy. CT LUMBAR SPINE IMPRESSION: 1. No fracture or other acute osseous abnormality within the lumbar spine. 2. 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 Resultant mild to  moderate canal and bilateral lateral recess stenosis at L2-3 through L5-S1 as above. 3. Multifactorial degenerative changes with resultant multilevel foraminal narrowing as above. Notable findings include moderate right L1 through L3 foraminal stenosis, with severe left L4 and moderate left L5 foraminal narrowing. 4. Sequelae of remote posterior decompression at L3-4 through L5-S1. Electronically Signed   By: Jeannine Boga M.D.   On: 04/28/2019 23:34   Dg C-arm 1-60 Min-no Report  Result Date: 04/02/2019 Fluoroscopy was utilized by the requesting physician.  No radiographic interpretation.   Ct Renal Stone Study  Result Date: 04/28/2019 CLINICAL DATA:  Initial evaluation for acute flank pain, stone disease suspected. EXAM: CT ABDOMEN AND PELVIS WITHOUT CONTRAST CT LUMBAR SPINE WITHOUT CONTRAST TECHNIQUE: Multidetector CT imaging of the abdomen and pelvis was performed following the standard protocol without IV contrast. COMPARISON:  None. FINDINGS: CT ABDOMEN AND PELVIS: Lower chest: Visualized lung bases are clear. Mild cardiomegaly partially visualized. Scattered mitral aortic valvular calcifications noted. Hepatobiliary: Liver demonstrates a normal unenhanced appearance. Gallbladder surgically absent. No biliary dilatation. Pancreas: Pancreas within normal limits. Spleen: Spleen within normal limits. Adrenals/Urinary Tract: Adrenal glands are normal. Kidneys equal in size bilaterally. Right kidney within normal limits without nephrolithiasis or obstructive uropathy. No radiopaque calculi seen along the course of the right renal collecting system. No right-sided hydroureter. On the left, multiple scattered nonobstructive calculi present within the left kidney, largest of which seen at the lower pole and measures up to 15 mm. No obstructive radiopaque calculi seen along the course of the left renal collecting system. No left-sided hydronephrosis or hydroureter. No appreciable focal renal lesions seen  on this noncontrast examination. Bladder largely decompressed without acute finding. No layering stones within the bladder lumen. Stomach/Bowel: Small hiatal hernia noted. Stomach otherwise unremarkable. No evidence for bowel obstruction. Appendix is surgically absent. Colonic diverticulosis without evidence for acute diverticulitis. No acute inflammatory changes seen about the bowels. Vascular/Lymphatic: Advanced atherosclerotic change seen throughout the intra-abdominal aorta and its branch vessels. No aneurysm. No adenopathy. Reproductive: Uterus is absent.  Ovaries not confidently identified. Other: No free air or fluid. Small fat containing paraumbilical hernia without associated inflammation. Musculoskeletal: Fixation screw in place at the proximal left femur. No acute osseous abnormality. No discrete lytic or blastic osseous lesions. CT LUMBAR SPINE: Normal segmentation. Lowest well-formed disc labeled the L5-S1 level. Moderate left convex scoliosis, apex at L1-2. 5 mm anterolisthesis of L5 on S1, with trace retrolisthesis of L2 on L3 and L3 on L4. Findings likely chronic and facet mediated. Vertebral body heights maintained without evidence for acute or chronic fracture. Visualized sacrum and pelvis intact. SI joints approximated. No discrete or worrisome osseous lesions. Paraspinous soft tissues demonstrate no acute finding. T12-L1: Chronic intervertebral disc space narrowing with disc desiccation. Chronic reactive endplate changes, greater on the right. Mild facet hypertrophy. No significant spinal stenosis. Mild-to-moderate right neural foraminal narrowing. L1-2: Chronic intervertebral disc space narrowing with disc desiccation. Right far lateral reactive endplate changes with marginal endplate osteophytic spurring. Mild to moderate facet hypertrophy. No significant spinal stenosis. Moderate right foraminal narrowing. L2-3: Chronic intervertebral disc space narrowing with diffuse disc bulge and disc  desiccation. Right far lateral reactive endplate changes with marginal endplate osteophytic spurring. Moderate bilateral facet hypertrophy. Resultant mild canal with mild to moderate right lateral recess narrowing. Moderate right L2 foraminal stenosis. L3-4: Chronic intervertebral disc space narrowing with disc desiccation  and diffuse disc bulge. Reactive endplate changes. Moderate facet hypertrophy. Prior posterior decompression. Mild canal with mild to moderate lateral recess narrowing. Moderate to advanced right with mild to moderate left L3 foraminal stenosis. L4-5: Chronic intervertebral disc space narrowing with diffuse disc bulge and disc desiccation. Reactive endplate changes. Severe left with moderate right facet arthrosis. Prior posterior decompression. Central canal patent. Probable mild residual lateral recess stenosis. Severe left L4 foraminal stenosis. Right neural foramen patent. L5-S1: Anterolisthesis. Chronic intervertebral disc spacing with disc desiccation and diffuse disc bulge. Severe bilateral facet arthrosis. Prior posterior decompression. Residual mild to moderate bilateral lateral recess narrowing, largely due to facet degeneration, greater on the left. Moderate left with mild right L5 foraminal stenosis. IMPRESSION: CT ABDOMEN AND PELVIS IMPRESSION: 1. Nonobstructive left renal nephrolithiasis as above. No CT evidence for obstructive uropathy. 2. No other acute intra-abdominal or pelvic process. 3. Colonic diverticulosis without evidence for acute diverticulitis. 4. Small hiatal hernia. 5. Sequelae of prior cholecystectomy, appendectomy, and hysterectomy. CT LUMBAR SPINE IMPRESSION: 1. No fracture or other acute osseous abnormality within the lumbar spine. 2. 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 Resultant mild to moderate canal and bilateral lateral recess stenosis at L2-3 through L5-S1 as above. 3.  Multifactorial degenerative changes with resultant multilevel foraminal narrowing as above. Notable findings include moderate right L1 through L3 foraminal stenosis, with severe left L4 and moderate left L5 foraminal narrowing. 4. Sequelae of remote posterior decompression at L3-4 through L5-S1. Electronically Signed   By: Jeannine Boga M.D.   On: 04/28/2019 23:34     MDM   Briefly, patient presenting for concern for restless leg syndrome.  On exam she had left lower back pain with radiation down the left leg consistent most likely with sciatica.  Had history of nephrolithiasis therefore CT renal scan and CT lumbar spine were obtained to further assess for this.  UA was also obtained.  UA with large leukocytes, 21-50 RBCs, 21-50 WBCs, no bacteria and 0-5 squamous epithelial cells.  Consistent with possible urinary tract infection.  Will send urine culture and start on Keflex prophylactically.  Patient CT scan of the abdomen/pelvis did not show any evidence of ureterolithiasis.  No acute intra-abdominal or pelvic process to explain symptoms.  CT of the lumbar spine showed multiple chronic changes of the spine as listed above.  Today she has no red flag signs or symptoms to suggest cauda equina.  I feel that she is appropriate to follow-up with her neurosurgeon with regards to her CT scan.  She will be Rx for muscle relaxer for home to help with her symptoms.  Specific return precautions discussed and patient voiced understanding.  All questions answered.  Patient stable for discharge.      Bishop Dublin 04/29/19 0036    Dorie Rank, MD 04/29/19 [phone]

## 2019-04-29 ENCOUNTER — Telehealth: Payer: Self-pay

## 2019-04-29 LAB — URINALYSIS, ROUTINE W REFLEX MICROSCOPIC
Bacteria, UA: NONE SEEN
Bilirubin Urine: NEGATIVE
Glucose, UA: NEGATIVE mg/dL
Hgb urine dipstick: NEGATIVE
Ketones, ur: NEGATIVE mg/dL
Nitrite: NEGATIVE
Protein, ur: NEGATIVE mg/dL
Specific Gravity, Urine: 1.02 (ref 1.005–1.030)
pH: 5 (ref 5.0–8.0)

## 2019-04-29 MED ORDER — CEPHALEXIN 500 MG PO CAPS: 500.0000 mg | ORAL_CAPSULE | Freq: Two times a day (BID) | ORAL | 0 refills | Status: AC

## 2019-04-29 MED ORDER — CEPHALEXIN 500 MG PO CAPS: 500.0000 mg | ORAL_CAPSULE | Freq: Two times a day (BID) | ORAL | 0 refills | Status: DC

## 2019-04-29 NOTE — Telephone Encounter (Signed)
Patient calls nurse line stating she saw PCP yesterday for her restless leg and they decided to up Ropinirole to 4mg . Patient she had a bad reaction to this dose last night and ended up in the ER. Patient stated she was really jumpy and her restless legs were worse. Patient stated she is trying to get in to see an ortho MD, however is still waiting to hear back. Patient is requesting the dose of Valium and Oxycodone she was given in ED last night, be prescribed to her. Patient stated this helped her sleep. Just until she can get in with ortho. Please advise.

## 2019-04-29 NOTE — ED Notes (Signed)
Discharge instructions reviewed and opportunity for questions provided. Patient alert, ambulatory, and stable at discharge.

## 2019-04-29 NOTE — ED Notes (Signed)
Spoke with Amy from lab, stated she would add urine culture to be completed

## 2019-04-30 ENCOUNTER — Other Ambulatory Visit: Payer: Self-pay | Admitting: Family Medicine

## 2019-04-30 ENCOUNTER — Other Ambulatory Visit: Payer: Self-pay | Admitting: *Deleted

## 2019-04-30 DIAGNOSIS — G2581 Restless legs syndrome: Secondary | ICD-10-CM

## 2019-04-30 NOTE — Patient Outreach (Signed)
[facility]) Care Management  04/30/2019  Martha Mora [birth year] [id number]   Call placed to member's caregiver/daughter [name] to follow up on member's current medical condition and recent visit to the ED.  No answer, HIPAA compliant voice message left.  Will follow up within the next 3-4 business days.  Valente David, [place], MSN [place] [phone]

## 2019-04-30 NOTE — Telephone Encounter (Signed)
I will not prescribe valium and oxycodone together. This can be a dangerous combination, especially in someone her age as well as comorbidities. I am sorry the increase in requip did not help. I think she also needs to see Neurology, I'll place that referral. I'm glad she has f/u with Ortho.

## 2019-04-30 NOTE — Telephone Encounter (Signed)
Patient calls again to check the status. Patient stated she did get an apt scheduled with Dr. Mina Marble at [facility].

## 2019-05-01 ENCOUNTER — Other Ambulatory Visit: Payer: Self-pay | Admitting: Family Medicine

## 2019-05-01 ENCOUNTER — Telehealth: Payer: Self-pay | Admitting: Family Medicine

## 2019-05-01 LAB — URINE CULTURE: Culture: 50000 — AB

## 2019-05-01 MED ORDER — TIZANIDINE HCL 2 MG PO TABS: 2.0000 mg | ORAL_TABLET | Freq: Three times a day (TID) | ORAL | 0 refills | Status: DC | PRN

## 2019-05-01 NOTE — Telephone Encounter (Signed)
**  After Hours/ Emergency Line Call*  Received a call to report that Martha Mora is having severe restless leg syndrome.  Endorsing restless, jittery legs. She cannot sleep, she is miserable and cannot take it anymore. She went to the ED recently for this and was given a muscle relaxer, valium, and oxy. Requesting these medications to help relieve the pain. She has tried stretching and tylenol without relief. I asked her to do a piriformis stretch on the phone and this did not help.  Denying focal weakness.  Discussed I cannot prescribe valium and oxy together nor would I recommend those medications for what she has going. The best I can do is a muscle relaxer, tizanidine is the safest for geriatric patients. I sent a prescription in to her pharmacy. She has follow up with ortho on 6/30, she has had injections in her back in the past with success/relief.  Red flags discussed.  Will forward to PCP.  Steve Rattler, DO PGY-3, [facility]

## 2019-05-02 ENCOUNTER — Telehealth: Payer: Self-pay | Admitting: Emergency Medicine

## 2019-05-02 NOTE — Telephone Encounter (Signed)
Post ED Visit - Positive Culture Follow-up  Culture report reviewed by antimicrobial stewardship pharmacist: [place] Team []  Elenor Quinones, Pharm.D. []  Heide Guile, Pharm.D., BCPS AQ-ID []  Parks Neptune, Pharm.D., BCPS []  Alycia Rossetti, Pharm.D., BCPS []  [place], Pharm.D., BCPS, AAHIVP []  Legrand Como, Pharm.D., BCPS, AAHIVP []  Salome Arnt, PharmD, BCPS []  Johnnette Gourd, PharmD, BCPS []  Hughes Better, PharmD, BCPS []  Leeroy Cha, PharmD []  Laqueta Linden, PharmD, BCPS []  Albertina Parr, PharmD  [place] Team []  Leodis Sias, PharmD []  Lindell Spar, PharmD []  Royetta Asal, PharmD []  Graylin Shiver, Rph []  Rema Fendt) Glennon Mac, PharmD []  Arlyn Dunning, PharmD []  Netta Cedars, PharmD [x]  Dia Sitter, PharmD []  Leone Haven, PharmD []  Gretta Arab, PharmD []  Theodis Shove, PharmD []  Peggyann Juba, PharmD []  Reuel Boom, PharmD   Positive urine culture Treated with Cephalexin, organism sensitive to the same and no further patient follow-up is required at this time.  Larene Beach Vayla Wilhelmi 05/02/2019, 10:50 AM

## 2019-05-03 ENCOUNTER — Other Ambulatory Visit: Payer: Self-pay | Admitting: Family Medicine

## 2019-05-03 ENCOUNTER — Other Ambulatory Visit: Payer: Self-pay | Admitting: *Deleted

## 2019-05-03 MED ORDER — ROPINIROLE HCL 4 MG PO TABS: 4.0000 mg | ORAL_TABLET | Freq: Every day | ORAL | 1 refills | Status: DC

## 2019-05-03 NOTE — Addendum Note (Signed)
Addended by: Christen Bame D on: 05/03/2019 11:12 AM   Modules accepted: Orders

## 2019-05-03 NOTE — Telephone Encounter (Signed)
Spoke with pt. She said that is fine on the valium and oxycodone. Pt did say that her Ortho did Rx her Lyrica and it is working very well for her. Salvatore Marvel, CMA

## 2019-05-03 NOTE — Patient Outreach (Signed)
[facility]) Care Management  05/03/2019  Martha Mora 07/21/1946 [id number]   Call received from daughter [name] after missed call last week.  She report member is doing better now after being seen in the ED last week for reaction to increase in her Requip.  She is not taking Lyrica with no side effects at this time.  Daughter also report she was diagnosed with UTI and not on antibiotic therapy.  She has follow up appointments with urology tomorrow, with ortho on 6/17, and with the cardiologist on 6/18.  She denies member having any urgent issues, advised to contact this care manager with questions.  Will follow up within the next month.    [facility] CM Care Plan Problem One     Most Recent Value  Role Documenting the Problem One  Care Management Coordinator  Care Plan for Problem One  Active  [facility] Long Term Goal   Member will not be readmitted to hospital within the next 45 days  [facility]N Long Term Goal Start Date  03/17/19  [facility] Long Term Goal Met Date  05/03/19  [facility] CM Short Term Goal #1   Member will not report signs/symptoms of increase in infection over the next 3 weeks  [facility] CM Short Term Goal #1 Start Date  05/03/19  Interventions for Short Term Goal #1  Educated on use of antibiotics and finishing complete course.  Educated on signs/symptoms of increase in infection  TH[facility] CM Short Term Goal #2   Daughter will report plan to afford Eliquis within the next 2 weeks  [facility] CM Short Term Goal #2 Start Date  04/13/19  [facility] CM Short Term Goal #2 Met Date  05/03/19     Valente David, RN, MSN [place] [phone]

## 2019-05-03 NOTE — Telephone Encounter (Signed)
!  st rx failed.  Resent.  Christen Bame, CMA

## 2019-05-04 ENCOUNTER — Encounter: Payer: Self-pay | Admitting: *Deleted

## 2019-05-04 DIAGNOSIS — N202 Calculus of kidney with calculus of ureter: Secondary | ICD-10-CM | POA: Diagnosis not present

## 2019-05-06 ENCOUNTER — Ambulatory Visit: Payer: Self-pay | Admitting: *Deleted

## 2019-05-06 DIAGNOSIS — R072 Precordial pain: Secondary | ICD-10-CM | POA: Diagnosis not present

## 2019-05-06 DIAGNOSIS — I10 Essential (primary) hypertension: Secondary | ICD-10-CM | POA: Diagnosis not present

## 2019-05-06 DIAGNOSIS — R0602 Shortness of breath: Secondary | ICD-10-CM | POA: Diagnosis not present

## 2019-05-06 DIAGNOSIS — I2510 Atherosclerotic heart disease of native coronary artery without angina pectoris: Secondary | ICD-10-CM | POA: Diagnosis not present

## 2019-05-10 ENCOUNTER — Other Ambulatory Visit: Payer: Self-pay | Admitting: Pharmacy Technician

## 2019-05-10 NOTE — Patient Outreach (Signed)
[facility]) Care Management  05/10/2019  Martha Mora May 20, 1946 [id number]    Unsuccessful outreach call placed to patient in regards to BMS application for Eliquis.  Unfortunately patient did not answer the phone.  Was calling patient to inquire if she received the patient assistance application that was mailed on 04/27/2019.  Will followup with second outreach attempt in 3-5 business days.  [name] P. Shyvonne Chastang, [place] Management [phone]

## 2019-05-15 ENCOUNTER — Other Ambulatory Visit: Payer: Self-pay | Admitting: Family Medicine

## 2019-05-17 ENCOUNTER — Other Ambulatory Visit: Payer: Self-pay | Admitting: Pharmacy Technician

## 2019-05-17 ENCOUNTER — Institutional Professional Consult (permissible substitution): Payer: Medicare Other | Admitting: Neurology

## 2019-05-17 NOTE — Patient Outreach (Signed)
[facility]) Care Management  05/17/2019  Martha Mora Oct 16, 1946 [id number]  Second unsuccessful followup call placed to patient in regards to BMS application for Eliquis.  Unfortunately patient did not answer the phone, HIPAA compliant voicemail left.  Was calling patient to inquire if she has received the patient assistance application.  Will followup with patient in 3-7 business days if call is not returned.  Janellie Tennison P. Lowery Paullin, [place] Management [phone]

## 2019-05-20 ENCOUNTER — Other Ambulatory Visit: Payer: Self-pay | Admitting: Family Medicine

## 2019-05-20 ENCOUNTER — Telehealth: Payer: Self-pay | Admitting: *Deleted

## 2019-05-20 MED ORDER — PREGABALIN 75 MG PO CAPS: 75.0000 mg | ORAL_CAPSULE | Freq: Every day | ORAL | 0 refills | Status: DC

## 2019-05-20 NOTE — Telephone Encounter (Signed)
She has been on this previously for RLS as well. Will provide script but she should NOT take with ropinirole, I have removed this from her med list. Also avoid taking with tizanidine, her muscle relaxer. She should have an appointment with me coming up in the next few weeks. If not, she should make an appointment to talk more about RLS b/c we need to get repeat labs. She should also be wearing her CPAP as directed.

## 2019-05-20 NOTE — Telephone Encounter (Signed)
LM for patient to call back ok per DPR.  LM from MD for her.  Merritt Mccravy,CMA

## 2019-05-20 NOTE — Telephone Encounter (Signed)
Pt states that she was given lyrica by her orthopedist.  This really helped her restless leg syndrome and wonders if she can have a permanent script for this from [name]. Christen Bame, CMA

## 2019-05-24 ENCOUNTER — Other Ambulatory Visit: Payer: Self-pay | Admitting: Pharmacy Technician

## 2019-05-24 NOTE — Patient Outreach (Signed)
[facility]) Care Management  05/24/2019  Dietrich Ke Nov 09, 1946 [id number]    Unsuccessful outreach call placed to patient in regards to BMS application for Eliquis.  This was my 3rd unsuccessful outreach.  Unfortunately patient did not answer the phone, HIPAA compliant voicemail left.  Was calling patient to inquire if she had received the application that was mailed to her.  Will route note to [place] that the case is being closed due to lack of patient engagement and will remove myself from care team.  Luiz Ochoa. Terrance Usery, Melissa Management [phone]

## 2019-05-25 ENCOUNTER — Other Ambulatory Visit: Payer: Self-pay

## 2019-05-25 ENCOUNTER — Ambulatory Visit: Payer: Medicare Other | Admitting: Neurology

## 2019-05-25 ENCOUNTER — Encounter: Payer: Self-pay | Admitting: Neurology

## 2019-05-25 VITALS — BP 134/72 | HR 63 | Ht 61.0 in | Wt 219.0 lb

## 2019-05-25 DIAGNOSIS — G2581 Restless legs syndrome: Secondary | ICD-10-CM
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
## 2019-05-25 NOTE — Patient Instructions (Signed)
You can continue with the Lyrica as prescribed by [name].  Please use your CPAP for your sleep apnea and follow up with pulmonology at [facility].  There is Evidence that suggests that patients with sleep apnea and restless leg syndrome do better when they treat their sleep apnea with a CPAP machine and the restless leg symptoms improve over time.  I have also seen it in my practice.  You have tried multiple medications for your restless leg syndrome, currently you are reasonably well controlled on Lyrica.  Please also note that iron deficiency and anemia can cause restless leg symptoms to be worse, continue with your iron supplementation and follow-up with your primary care physician.  I will see you back on an as-needed basis.

## 2019-05-27 ENCOUNTER — Other Ambulatory Visit: Payer: Self-pay | Admitting: Pharmacist

## 2019-05-27 ENCOUNTER — Ambulatory Visit: Payer: Self-pay | Admitting: Pharmacist

## 2019-05-27 NOTE — Patient Outreach (Signed)
[facility]) Care Management  05/27/2019  Martha Mora 1946-10-01 [id number]   Patient was called to follow up on the Extra Help application that was completed on her behalf last month and to see if she received the patient assistance forms that were mailed to her. Unfortunately, she did not answer her phone.  Patient's daughter [name]) was also called but she did not answer either. HIPAA compliant messages were left on both voicemail's.  Plan: Await call back from patient or her daughter. Call patient back in 2 months.   Elayne Guerin, PharmD, [place] Clinical Pharmacist [phone]

## 2019-06-01 ENCOUNTER — Encounter (INDEPENDENT_AMBULATORY_CARE_PROVIDER_SITE_OTHER): Payer: Medicare Other | Admitting: Ophthalmology

## 2019-06-01 ENCOUNTER — Other Ambulatory Visit: Payer: Self-pay | Admitting: *Deleted

## 2019-06-01 NOTE — Patient Outreach (Signed)
[facility]) Care Management  06/01/2019  Martha Mora 07/20/1946 [id number]   Call placed to member and daughter to follow up on current health condition.  Daughter report member has been doing well overall, biggest concern at this time is pain control.  She was told to stop taking her antinflammatory medication that was treating her arthritic pain due to her Eloquis.  She has been taking max dose of Tylenol with minimal relief.  She has ordered Voltaren cream from [facility] that should arrive today, hoping this will provide some relief.  Advised that [facility] pharmacist has been attempting to reach them regarding medication assistance, encouraged to contact and ask about potential options for pain relief.  Member remains on long term antibiotic therapy due to recurrent UTI's.  Has follow up appointment with urologist on 9/15, will call sooner if showing signs of increased infection.  Has follow up with cardiologist on 8/19, due for follow up with primary MD.  Daughter will call to schedule.    Denies any urgent concerns at this time.  Will follow up within the next 2 weeks.  If pain better controlled and no further concerns will consider case closure.    [facility] CM Care Plan Problem One     Most Recent Value  Role Documenting the Problem One  Care Management Coordinator  Care Plan for Problem One  Not Active  [facility] CM Short Term Goal #1   Member will not report signs/symptoms of increase in infection over the next 3 weeks  [facility] CM Short Term Goal #1 Start Date  05/03/19  [facility] CM Short Term Goal #1 Met Date  06/01/19     Valente David, RN, MSN Colon Manager [phone]

## 2019-06-07 ENCOUNTER — Other Ambulatory Visit: Payer: Self-pay

## 2019-06-07 ENCOUNTER — Encounter (INDEPENDENT_AMBULATORY_CARE_PROVIDER_SITE_OTHER): Payer: Medicare Other | Admitting: Ophthalmology

## 2019-06-07 DIAGNOSIS — I10 Essential (primary) hypertension: Secondary | ICD-10-CM

## 2019-06-07 DIAGNOSIS — H353231 Exudative age-related macular degeneration, bilateral, with active choroidal neovascularization: Secondary | ICD-10-CM | POA: Diagnosis not present

## 2019-06-07 DIAGNOSIS — H33302 Unspecified retinal break, left eye: Secondary | ICD-10-CM | POA: Diagnosis not present

## 2019-06-07 DIAGNOSIS — H35033 Hypertensive retinopathy, bilateral: Secondary | ICD-10-CM | POA: Diagnosis not present

## 2019-06-07 DIAGNOSIS — H43813 Vitreous degeneration, bilateral: Secondary | ICD-10-CM | POA: Diagnosis not present

## 2019-06-08 ENCOUNTER — Other Ambulatory Visit: Payer: Self-pay | Admitting: Family Medicine

## 2019-06-08 DIAGNOSIS — M47816 Spondylosis without myelopathy or radiculopathy, lumbar region: Secondary | ICD-10-CM | POA: Diagnosis not present

## 2019-06-09 ENCOUNTER — Other Ambulatory Visit: Payer: Self-pay

## 2019-06-09 MED ORDER — TRAZODONE HCL 50 MG PO TABS: 50.0000 mg | ORAL_TABLET | Freq: Every evening | ORAL | 0 refills | Status: DC | PRN

## 2019-06-09 NOTE — Telephone Encounter (Signed)
Patient called nurse line stating she needs a refill on lyrica. Patient stated she has been taking 2 at a time now, and needs new rx to reflect this. Please advise.

## 2019-06-10 ENCOUNTER — Encounter: Payer: Self-pay | Admitting: Family Medicine

## 2019-06-10 MED ORDER — PREGABALIN 75 MG PO CAPS: 75.0000 mg | ORAL_CAPSULE | Freq: Every day | ORAL | 0 refills | Status: DC

## 2019-06-10 NOTE — Telephone Encounter (Signed)
I will refill her Lyrica this time but she should NOT take more than one per day as her Neurologist discussed with her. I won't refill it early next time. She needs to make sure to wear her CPAP every night to help with her sleep.

## 2019-06-10 NOTE — Telephone Encounter (Signed)
Spoke with pt about note left by [name]. Pt understood. Martha Mora, CMA

## 2019-06-11 ENCOUNTER — Other Ambulatory Visit: Payer: Self-pay

## 2019-06-11 ENCOUNTER — Ambulatory Visit (INDEPENDENT_AMBULATORY_CARE_PROVIDER_SITE_OTHER): Payer: Medicare Other | Admitting: Family Medicine

## 2019-06-11 ENCOUNTER — Encounter: Payer: Self-pay | Admitting: Family Medicine

## 2019-06-11 VITALS — BP 140/70 | HR 68

## 2019-06-11 DIAGNOSIS — R8290 Unspecified abnormal findings in urine: Secondary | ICD-10-CM | POA: Diagnosis not present

## 2019-06-11 DIAGNOSIS — I10 Essential (primary) hypertension: Secondary | ICD-10-CM

## 2019-06-11 DIAGNOSIS — R399 Unspecified symptoms and signs involving the genitourinary system: Secondary | ICD-10-CM

## 2019-06-11 DIAGNOSIS — G2581 Restless legs syndrome: Secondary | ICD-10-CM | POA: Diagnosis not present

## 2019-06-11 LAB — POCT URINALYSIS DIP (MANUAL ENTRY)
Bilirubin, UA: NEGATIVE
Glucose, UA: NEGATIVE mg/dL
Ketones, POC UA: NEGATIVE mg/dL
Nitrite, UA: NEGATIVE
Protein Ur, POC: NEGATIVE mg/dL
Spec Grav, UA: 1.02 (ref 1.010–1.025)
Urobilinogen, UA: 0.2 E.U./dL
pH, UA: 6 (ref 5.0–8.0)

## 2019-06-11 LAB — POCT UA - MICROSCOPIC ONLY: WBC, Ur, HPF, POC: >20

## 2019-06-11 MED ORDER — PREGABALIN 150 MG PO CAPS: 150.0000 mg | ORAL_CAPSULE | Freq: Every day | ORAL | 0 refills | Status: DC

## 2019-06-11 NOTE — Assessment & Plan Note (Signed)
Improved with increase in Lyrica to 150 mg nightly.  Is also now more compliant with CPAP.  GAD done at last visit which was negative for typical anxiety symptoms, was just elevated and restlessness symptoms.  Extensive counseling done today regarding proper sleep hygiene. Given her renal function, she can likely remain on increased dose of Lyrica, refill sent.  We will also check CBC today given history of anemia and had recently started iron supplementation at prior visit.

## 2019-06-11 NOTE — Progress Notes (Signed)
Subjective:   Patient ID: Martha Mora    DOB: 1946/05/03, 73 y.o. female   MRN: [id number]  Martha Mora is a 73 y.o. female with a history of HTN, CAD, MVR, sleep apnea, GERD, IBS, diverticulosis, OA, obesity, HLD, anxiety, h/o breast cancer, RLS, mixed incontinence, insomnia here for   CC: Wants to get urine checked  HPI Patient is concerned about urine infections as she was recently hospitalized in 02/2019 for urosepsis despite having difficulty urinary complaints at that time.  She reports at that time she only had a foul odor to her urine but denied dysuria.  She was seen in the ED 6/11 and had urine culture positive for Proteus and was treated with Keflex.  She has since completed this course as well as her Bactrim prescribed by urology.  She wants her urine rechecked as she is concerned about getting another urine infection.  She currently denies dysuria, urinary frequency, urgency, foul odor.  She is followed by urology.  Since our last appointment, she has seen neurology for her restless legs.  She is taking Lyrica 150 mg and this has helped greatly with her restless legs.  She has now become compliant with her CPAP so it is still causing her great anxiety with claustrophobia.  She typically goes to bed around 10:30 PM, stays asleep for about an hour and a half then is up for 2 hours then goes back to sleep for about 3 hours.  She normally gets up around 6 AM.  She states she does not have a typical routine prior to going to bed.  She watches TV typically before bed and placed on her phone.  She states the TV lulls her to sleep.  She has bought the magnesium supplementation previously recommended but has not tried this yet.  Review of Systems:  Per HPI.  [place], medications and smoking status reviewed.  Objective:   BP 140/70   Pulse 68   SpO2 97%  Vitals and nursing note reviewed.  General: Obese female, in wheelchair, in no acute distress with non-toxic appearance CV:  regular rate and rhythm without murmurs, rubs, or gallops Lungs: clear to auscultation bilaterally with normal work of breathing Abdomen: soft, non-tender, non-distended, no masses or organomegaly palpable, normoactive bowel sounds.  No suprapubic tenderness.  No CVA tenderness. Skin: warm, dry, no rashes or lesions Extremities: warm and well perfused, normal tone MSK: ROM grossly intact, strength intact, gait normal.  Frequently moves legs Neuro: Alert and oriented, speech normal  Assessment & Plan:   Restless leg syndrome Improved with increase in Lyrica to 150 mg nightly.  Is also now more compliant with CPAP.  GAD done at last visit which was negative for typical anxiety symptoms, was just elevated and restlessness symptoms.  Extensive counseling done today regarding proper sleep hygiene. Given her renal function, she can likely remain on increased dose of Lyrica, refill sent.  We will also check CBC today given history of anemia and had recently started iron supplementation at prior visit.  Urine concern UA obtained prior to patient exam, was positive for leukocytes, however given patient is asymptomatic, would not treat at this time.  Likely foul odor at previous hospitalization indicative of urine infection, also had obstructing stone at that time.  Most recent CT at ED visit 6/11 did not show any obstructing stones, patient is without CVA or suprapubic tenderness on exam today.  Is closely followed by urology.  Reassurance to patient provided.  Orders Placed This Encounter  Procedures  . Basic Metabolic Panel  . CBC  . POCT urinalysis dipstick  . POCT UA - Microscopic Only   Meds ordered this encounter  Medications  . pregabalin (LYRICA) 150 MG capsule    Sig: Take 1 capsule (150 mg total) by mouth at bedtime.    Dispense:  90 capsule    Refill:  0    [name], DO PGY-3, [facility] 06/11/2019 6:38 PM

## 2019-06-11 NOTE — Patient Instructions (Addendum)
It was great to see you!  Our plans for today:  - You can indeed take lyrica 150mg , I'll adjust your dose on your prescription for this. -Try taking the magnesium supplementation. - If you develop urinary symptoms (Pain with urination, increased urinary frequency, urinary urgency), come back to be seen.  - Try the following to help you sleep better:  - limit naps during the day  - no screens (TV, phone, tablet, computer) at least 1-2 hours before bedtime.  - have a quiet and dark sleeping environment.  - no large meals or drinks about 1 hour before bed.  - Avoid taking diuretics (hydrochlorothiazide, furosemide) in the evenings.  - Exercise or move your body regularly every day.  - You can also try melatonin 10 mg over the counter. Take this 1-2 hours before bed. You can increase to 20mg  if this is not helpful. - If you are lying in bed for 30 mins-1 hour and aren't falling asleep, get out of bed and do something relaxing like reading (NO TV!) until you are tired.  We are checking some labs today, we will call you or send you a letter if they are abnormal.   Take care and seek immediate care sooner if you develop any concerns.   Dr. [facility]

## 2019-06-12 LAB — BASIC METABOLIC PANEL
BUN/Creatinine Ratio: 17 (ref 12–28)
BUN: 15 mg/dL (ref 8–27)
CO2: 23 mmol/L (ref 20–29)
Calcium: 8.6 mg/dL — ABNORMAL LOW (ref 8.7–10.3)
Chloride: 105 mmol/L (ref 96–106)
Creatinine, Ser: 0.86 mg/dL (ref 0.57–1.00)
GFR calc Af Amer: 76 mL/min/{1.73_m2} (ref >59)
GFR calc non Af Amer: 66 mL/min/{1.73_m2} (ref >59)
Glucose: 85 mg/dL (ref 65–99)
Potassium: 4.4 mmol/L (ref 3.5–5.2)
Sodium: 141 mmol/L (ref 134–144)

## 2019-06-12 LAB — CBC
Hematocrit: 35.3 % (ref 34.0–46.6)
Hemoglobin: 10.7 g/dL — ABNORMAL LOW (ref 11.1–15.9)
MCH: 27.2 pg (ref 26.6–33.0)
MCHC: 30.3 g/dL — ABNORMAL LOW (ref 31.5–35.7)
MCV: 90 fL (ref 79–97)
Platelets: 145 10*3/uL — ABNORMAL LOW (ref 150–450)
RBC: 3.93 x10E6/uL (ref 3.77–5.28)
RDW: 16.8 % — ABNORMAL HIGH (ref 11.7–15.4)
WBC: 14.4 10*3/uL — ABNORMAL HIGH (ref 3.4–10.8)

## 2019-06-14 DIAGNOSIS — M79672 Pain in left foot: Secondary | ICD-10-CM | POA: Diagnosis not present

## 2019-06-14 DIAGNOSIS — M542 Cervicalgia: Secondary | ICD-10-CM | POA: Diagnosis not present

## 2019-06-14 DIAGNOSIS — M25512 Pain in left shoulder: Secondary | ICD-10-CM | POA: Diagnosis not present

## 2019-06-14 DIAGNOSIS — M25511 Pain in right shoulder: Secondary | ICD-10-CM | POA: Diagnosis not present

## 2019-06-15 ENCOUNTER — Other Ambulatory Visit: Payer: Self-pay | Admitting: *Deleted

## 2019-06-15 NOTE — Patient Outreach (Signed)
[facility]) Care Management  06/15/2019  Martha Mora Sep 30, 1946 [id number]   Call placed to [facility] caregiver/daughter [name] to follow up on member's pain control.  Daughter report pain is slightly better.  Was seen yesterday by her orthopedic specialist, reportedly will need surgery for bone spur and back in the future.  Per daughter, member is not ready for surgery just yet.  She has received the Voltaren gel, using that and Tylenol with some relief.  Was also seen by primary MD last week, no urgent concerns noted.  Assessed for further complex needs, daughter report member is now at a "stand still" until decision is made for surgery.  Discussed case closure, but advised daughter to contact this care manager if needs change or member decides to have surgery.  She verbalizes understanding.  Will close case at this time as goals are met.  Will notify member's primary MD of case closure.  Valente David, [place], MSN [place] [phone]

## 2019-06-30 ENCOUNTER — Other Ambulatory Visit: Payer: Self-pay

## 2019-06-30 DIAGNOSIS — R42 Dizziness and giddiness: Secondary | ICD-10-CM | POA: Insufficient documentation

## 2019-06-30 DIAGNOSIS — I4891 Unspecified atrial fibrillation: Secondary | ICD-10-CM | POA: Insufficient documentation

## 2019-06-30 DIAGNOSIS — R0602 Shortness of breath: Secondary | ICD-10-CM | POA: Diagnosis not present

## 2019-06-30 DIAGNOSIS — I10 Essential (primary) hypertension: Secondary | ICD-10-CM | POA: Diagnosis not present

## 2019-06-30 DIAGNOSIS — S0990XA Unspecified injury of head, initial encounter: Secondary | ICD-10-CM | POA: Diagnosis not present

## 2019-06-30 DIAGNOSIS — Z7901 Long term (current) use of anticoagulants: Secondary | ICD-10-CM | POA: Diagnosis not present

## 2019-06-30 DIAGNOSIS — Z87891 Personal history of nicotine dependence: Secondary | ICD-10-CM | POA: Diagnosis not present

## 2019-06-30 DIAGNOSIS — N390 Urinary tract infection, site not specified: Secondary | ICD-10-CM | POA: Diagnosis not present

## 2019-06-30 DIAGNOSIS — R51 Headache: Secondary | ICD-10-CM | POA: Insufficient documentation

## 2019-06-30 DIAGNOSIS — J449 Chronic obstructive pulmonary disease, unspecified: Secondary | ICD-10-CM | POA: Insufficient documentation

## 2019-06-30 DIAGNOSIS — Y9389 Activity, other specified: Secondary | ICD-10-CM | POA: Insufficient documentation

## 2019-06-30 DIAGNOSIS — Y999 Unspecified external cause status: Secondary | ICD-10-CM | POA: Insufficient documentation

## 2019-06-30 DIAGNOSIS — Z79899 Other long term (current) drug therapy: Secondary | ICD-10-CM | POA: Diagnosis not present

## 2019-06-30 DIAGNOSIS — I259 Chronic ischemic heart disease, unspecified: Secondary | ICD-10-CM | POA: Diagnosis not present

## 2019-06-30 DIAGNOSIS — Y92810 Car as the place of occurrence of the external cause: Secondary | ICD-10-CM | POA: Diagnosis not present

## 2019-06-30 LAB — URINALYSIS, ROUTINE W REFLEX MICROSCOPIC
Bilirubin Urine: NEGATIVE
Glucose, UA: NEGATIVE mg/dL
Ketones, ur: NEGATIVE mg/dL
Nitrite: NEGATIVE
Protein, ur: NEGATIVE mg/dL
Specific Gravity, Urine: 1.016 (ref 1.005–1.030)
WBC, UA: >50 WBC/hpf — ABNORMAL HIGH (ref 0–5)
pH: 5 (ref 5.0–8.0)

## 2019-06-30 LAB — BASIC METABOLIC PANEL
Anion gap: 10 (ref 5–15)
BUN: 13 mg/dL (ref 8–23)
CO2: 24 mmol/L (ref 22–32)
Calcium: 9 mg/dL (ref 8.9–10.3)
Chloride: 109 mmol/L (ref 98–111)
Creatinine, Ser: 0.84 mg/dL (ref 0.44–1.00)
GFR calc Af Amer: >60 mL/min (ref >60)
GFR calc non Af Amer: >60 mL/min (ref >60)
Glucose, Bld: 152 mg/dL — ABNORMAL HIGH (ref 70–99)
Potassium: 4.2 mmol/L (ref 3.5–5.1)
Sodium: 143 mmol/L (ref 135–145)

## 2019-06-30 LAB — CBC
HCT: 37.8 % (ref 36.0–46.0)
Hemoglobin: 11.1 g/dL — ABNORMAL LOW (ref 12.0–15.0)
MCH: 28.2 pg (ref 26.0–34.0)
MCHC: 29.4 g/dL — ABNORMAL LOW (ref 30.0–36.0)
MCV: 95.9 fL (ref 80.0–100.0)
Platelets: 245 10*3/uL (ref 150–400)
RBC: 3.94 MIL/uL (ref 3.87–5.11)
RDW: 18.1 % — ABNORMAL HIGH (ref 11.5–15.5)
WBC: 11.3 10*3/uL — ABNORMAL HIGH (ref 4.0–10.5)
nRBC: 0 % (ref 0.0–0.2)

## 2019-06-30 MED ORDER — SODIUM CHLORIDE 0.9% FLUSH: 3.0000 mL | Freq: Once | INTRAVENOUS | Status: DC

## 2019-06-30 NOTE — ED Notes (Signed)
Urine culture sent to the lab. 

## 2019-06-30 NOTE — ED Triage Notes (Signed)
Per pt: Pt reports she knocked her head on her car door and has had dizziness, SOB, nausea and headache since.  Pt denies LOC

## 2019-07-01 ENCOUNTER — Emergency Department (HOSPITAL_COMMUNITY): Payer: Medicare Other

## 2019-07-01 ENCOUNTER — Emergency Department (HOSPITAL_COMMUNITY)
Admission: EM | Admit: 2019-07-01 | Discharge: 2019-07-01 | Disposition: A | Discharge status: Home / Self Care | Payer: Medicare Other | Attending: Emergency Medicine | Admitting: Emergency Medicine

## 2019-07-01 DIAGNOSIS — S0990XA Unspecified injury of head, initial encounter: Secondary | ICD-10-CM

## 2019-07-01 DIAGNOSIS — R51 Headache: Secondary | ICD-10-CM | POA: Diagnosis not present

## 2019-07-01 DIAGNOSIS — R0602 Shortness of breath: Secondary | ICD-10-CM | POA: Diagnosis not present

## 2019-07-01 DIAGNOSIS — I10 Essential (primary) hypertension: Secondary | ICD-10-CM

## 2019-07-01 DIAGNOSIS — N390 Urinary tract infection, site not specified: Secondary | ICD-10-CM

## 2019-07-01 DIAGNOSIS — R42 Dizziness and giddiness: Secondary | ICD-10-CM

## 2019-07-01 MED ORDER — CEFDINIR 300 MG PO CAPS: 300.0000 mg | ORAL_CAPSULE | Freq: Two times a day (BID) | ORAL | 0 refills | Status: DC

## 2019-07-01 MED ORDER — SODIUM CHLORIDE 0.9 % IV BOLUS (SEPSIS)
500.0000 mL | Freq: Once | INTRAVENOUS | Status: AC
  Administered 2019-07-01: 02:00:00 500 mL via INTRAVENOUS

## 2019-07-01 MED ORDER — ALBUTEROL SULFATE HFA 108 (90 BASE) MCG/ACT IN AERS
4.0000 | INHALATION_SPRAY | Freq: Once | RESPIRATORY_TRACT | Status: AC
  Administered 2019-07-01: 4 via RESPIRATORY_TRACT
  Filled 2019-07-01: qty 6.7

## 2019-07-01 MED ORDER — ONDANSETRON 4 MG PO TBDP: 4.0000 mg | ORAL_TABLET | Freq: Four times a day (QID) | ORAL | 0 refills | Status: DC | PRN

## 2019-07-01 MED ORDER — MECLIZINE HCL 25 MG PO TABS: 25.0000 mg | ORAL_TABLET | Freq: Three times a day (TID) | ORAL | 0 refills | Status: DC | PRN

## 2019-07-01 MED ORDER — PREGABALIN 50 MG PO CAPS
150.0000 mg | ORAL_CAPSULE | Freq: Once | ORAL | Status: AC
  Administered 2019-07-01: 03:00:00 150 mg via ORAL
  Filled 2019-07-01: qty 3

## 2019-07-01 MED ORDER — SODIUM CHLORIDE 0.9 % IV SOLN
1.0000 g | Freq: Once | INTRAVENOUS | Status: AC
  Administered 2019-07-01: 02:00:00 1 g via INTRAVENOUS
  Filled 2019-07-01: qty 10
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
## 2019-07-01 NOTE — ED Notes (Signed)
Called lab and spoke with Amy to add on urine culture.

## 2019-07-01 NOTE — ED Notes (Signed)
Pt ambulated to RR and upon returning was significantly short of breath. Notified MD of same. Ambulated pt while monitoring oxygen level. Oxygen did not drop below 91% and returned to high 90s upon returning to room and sitting in a chair. Dr. Leonides Schanz made aware of same.

## 2019-07-01 NOTE — ED Notes (Signed)
Pt returned from CT °

## 2019-07-01 NOTE — ED Notes (Signed)
Pt transported to CT ?

## 2019-07-01 NOTE — Discharge Instructions (Signed)
Your labs, chest x-ray, head CT today were normal.  You may use Tylenol 1000 mg every 6 hours as needed for pain.  We are discharging you with prescription of meclizine that you can take if you develop vertigo again as well as a prescription of Zofran for nausea.  Your blood pressure has been elevated today.  Please continue your medications as prescribed but I recommend close follow-up with your primary doctor.  It does appear that you have a urinary tract infection today.  We have given you a dose of IV Rocephin here in the emergency department and are discharging you home on an antibiotic called cefdinir.  Please take this medication until complete.

## 2019-07-01 NOTE — ED Notes (Signed)
Pt ambulated to RR with minor assistance. States she typically uses a cane.

## 2019-07-02 LAB — URINE CULTURE: Culture: NO GROWTH

## 2019-07-03 ENCOUNTER — Other Ambulatory Visit: Payer: Self-pay | Admitting: Family Medicine

## 2019-07-06 ENCOUNTER — Other Ambulatory Visit: Payer: Self-pay | Admitting: Family Medicine

## 2019-07-07 ENCOUNTER — Other Ambulatory Visit: Payer: Self-pay | Admitting: Family Medicine

## 2019-07-07 DIAGNOSIS — R0602 Shortness of breath: Secondary | ICD-10-CM | POA: Diagnosis not present

## 2019-07-07 DIAGNOSIS — R072 Precordial pain: Secondary | ICD-10-CM | POA: Diagnosis not present

## 2019-07-07 DIAGNOSIS — M25562 Pain in left knee: Secondary | ICD-10-CM | POA: Diagnosis not present

## 2019-07-07 DIAGNOSIS — M25561 Pain in right knee: Secondary | ICD-10-CM | POA: Diagnosis not present

## 2019-07-08 ENCOUNTER — Other Ambulatory Visit: Payer: Self-pay | Admitting: Family Medicine

## 2019-07-08 ENCOUNTER — Telehealth: Payer: Self-pay

## 2019-07-08 MED ORDER — FLUCONAZOLE 150 MG PO TABS: 150.0000 mg | ORAL_TABLET | Freq: Once | ORAL | 0 refills | Status: DC

## 2019-07-08 NOTE — Telephone Encounter (Signed)
Received fax from pharmacy requesting diflucan. Patient was seen in hospital and given an antibiotic for a UTI. Please advise.

## 2019-07-08 NOTE — Telephone Encounter (Signed)
Called patient to determine whether or not she was having symptoms, did not answer and VM full. Called pharmacy to see if she requested medication or if it was automatic refill request (which would be odd) but they were not able to tell.  If patient calls back, would ask what symptoms she is having. If she is not having any symptoms (vaginal irritation, discharge, or itching), we do not need to prescribe this regardless if she just completed an antibiotic course.

## 2019-07-09 ENCOUNTER — Other Ambulatory Visit: Payer: Self-pay

## 2019-07-09 DIAGNOSIS — M47816 Spondylosis without myelopathy or radiculopathy, lumbar region: Secondary | ICD-10-CM | POA: Diagnosis not present

## 2019-07-09 DIAGNOSIS — M79671 Pain in right foot: Secondary | ICD-10-CM | POA: Diagnosis not present

## 2019-07-09 MED ORDER — TRAZODONE HCL 50 MG PO TABS: 50.0000 mg | ORAL_TABLET | Freq: Every evening | ORAL | 0 refills | Status: DC | PRN

## 2019-07-14 ENCOUNTER — Telehealth: Payer: Self-pay

## 2019-07-14 NOTE — Telephone Encounter (Signed)
Pt calling to let [name] know know that she can no longer take the anti inflammatory since she is taking eliquist.  Pt is asking if she can take 2 Trazodone to help her sleep? She has been taking 2 and she sleeps well for 6 hours. Please call pt and let her know. If this is ok pt will need the Rx increased. Ottis Stain, CMA

## 2019-07-15 ENCOUNTER — Other Ambulatory Visit: Payer: Self-pay | Admitting: Family Medicine

## 2019-07-15 MED ORDER — TRAZODONE HCL 50 MG PO TABS: ORAL_TABLET | ORAL | 1 refills | Status: DC

## 2019-07-15 NOTE — Telephone Encounter (Signed)
This is fine. I will increase the prescription.

## 2019-07-15 NOTE — Telephone Encounter (Signed)
Patient is coming for appointment to follow up tomorrow.  Kanav Kazmierczak,CMA

## 2019-07-16 ENCOUNTER — Other Ambulatory Visit: Payer: Self-pay

## 2019-07-16 ENCOUNTER — Ambulatory Visit (INDEPENDENT_AMBULATORY_CARE_PROVIDER_SITE_OTHER): Payer: Medicare Other | Admitting: Family Medicine

## 2019-07-16 ENCOUNTER — Encounter: Payer: Self-pay | Admitting: Family Medicine

## 2019-07-16 VITALS — BP 142/66 | HR 68 | Ht 61.0 in | Wt 228.2 lb

## 2019-07-16 DIAGNOSIS — N898 Other specified noninflammatory disorders of vagina: Secondary | ICD-10-CM | POA: Diagnosis not present

## 2019-07-16 DIAGNOSIS — R4181 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: Secondary | ICD-10-CM | POA: Diagnosis not present

## 2019-07-16 LAB — POCT WET PREP (WET MOUNT)
Clue Cells Wet Prep Whiff POC: NEGATIVE
Trichomonas Wet Prep HPF POC: ABSENT

## 2019-07-16 NOTE — Assessment & Plan Note (Signed)
Mini cog: Clock test normal.  Recalled 2 out of 3 words at 5 minutes. BMP, CBC within normal limits at last check 06/30/2019, TSH normal 09/2018.  Neuro exam within normal limits.  Suspect memory deficits are within age-related normal limits, shows no evidence of progressive dementia on exam today.  Will however refer to geriatric clinic for more extensive testing, especially given extensive comorbidities, polypharmacy, and recent hospitalization.

## 2019-07-16 NOTE — Patient Instructions (Signed)
It was great to see you!  Our plans for today:  - We are referring you to the Geriatric Clinic, we will call you to set up this appointment. They will do more extensive testing for dementia at this appointment. - We are checking some labs today, we will call you or send you a letter if they are abnormal.   Take care and seek immediate care sooner if you develop any concerns.   Dr. [facility]

## 2019-07-16 NOTE — Progress Notes (Signed)
  Subjective:   Patient ID: Martha Mora    DOB: 1946-07-07, 73 y.o. female   MRN: [id number]  Martha Mora is a 73 y.o. female with a history of HTN, CAD, internal hemorrhoids, OSA, GERD, IBS, diverticulosis, OA, osteopenia, DUB, h/o breast cancer, anxiety, obesity, HLD, depression, h/o tobacco use, insomnia, mixed incontinence, RLS here for   Testing for dementia - keeps forgetting things and wants to be tested for dementia. For example, wants to add milk to her grocery list and when she gets her list, she forgets what she wanted to add. - has been going on since she got out of hospital - no one has noticed it, lives with daughter.  - no headaches, no vision changes.   Yeast infection - had antibiotic for UTI, finished 2 days ago.  Reports clear vaginal discharge although this is not changed from normal.  Reports itching.  Denies bleeding.  Not sexually active.  Denies instrumentation or douching.  Denies fever, dysuria, belly or back pain, genital sores, rash.  Review of Systems:  Per HPI.  [place], medications and smoking status reviewed.  Objective:   BP (!) 142/66   Pulse 68   Ht 5\' 1"  (1.549 m)   Wt 228 lb 4 oz (103.5 kg)   SpO2 96%   BMI 43.13 kg/m  Vitals and nursing note reviewed.  General: Overweight elderly female, in no acute distress with non-toxic appearance HEENT: normocephalic, atraumatic, moist mucous membranes CV: regular rate and rhythm without murmurs, rubs, or gallops Lungs: clear to auscultation bilaterally with normal work of breathing GYN:  External genitalia within normal limits.  Vaginal mucosa pink, moist, normal rugae.  Very minimal clear discharge noted on speculum exam.  No bleeding.     Skin: warm, dry, no rashes or lesions Extremities: warm and well perfused, normal tone MSK: Full ROM, normal gait.  No edema.  Neuro: Alert and oriented, speech normal. Optic field normal. Extraocular movements intact.  Hearing grossly intact bilaterally.   Tongue protrudes normally with no deviation.  Shoulder shrug, smile symmetric. Finger to nose normal. Grip strength intact.  Assessment & Plan:   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 Mini cog: Clock test normal.  Recalled 2 out of 3 words at 5 minutes. BMP, CBC within normal limits at last check 06/30/2019, TSH normal 09/2018.  Neuro exam within normal limits.  Suspect memory deficits are within age-related normal limits, shows no evidence of progressive dementia on exam today.  Will however refer to geriatric clinic for more extensive testing, especially given extensive comorbidities, polypharmacy, and recent hospitalization.  Vaginal discharge No appreciable abnormal discharge on exam, normal gynecological exam.  Negative wet prep, no indication for antifungal or antibiotic treatment.  Itching may be from harsh soaps or irritants, recommend gentle soaps for sensitive skin and appropriate genital hygiene.  Orders Placed This Encounter  Procedures  . POCT Wet Prep [facility])   No orders of the defined types were placed in this encounter.  [name], DO PGY-3, [facility] 07/16/2019 11:32 AM

## 2019-07-16 NOTE — Assessment & Plan Note (Signed)
No appreciable abnormal discharge on exam, normal gynecological exam.  Negative wet prep, no indication for antifungal or antibiotic treatment.  Itching may be from harsh soaps or irritants, recommend gentle soaps for sensitive skin and appropriate genital hygiene.

## 2019-07-19 ENCOUNTER — Other Ambulatory Visit: Payer: Self-pay | Admitting: Nurse Practitioner

## 2019-07-19 DIAGNOSIS — M8580 Other specified disorders of bone density and structure, unspecified site: Secondary | ICD-10-CM

## 2019-07-21 ENCOUNTER — Other Ambulatory Visit: Payer: Medicare Other

## 2019-07-21 ENCOUNTER — Ambulatory Visit: Payer: Medicare Other

## 2019-08-04 ENCOUNTER — Ambulatory Visit: Payer: Self-pay | Admitting: Pharmacist

## 2019-08-04 ENCOUNTER — Other Ambulatory Visit: Payer: Self-pay | Admitting: Pharmacist

## 2019-08-04 NOTE — Patient Outreach (Signed)
[facility]) Care Management  08/04/2019  Martha Mora December 16, 1945 [id number]   Patient was called regarding medication assistance. Unfortunately, she did not answer her phone.  HIPAA compliant message was also left on her voicemail. Her daughter [name]) was called as well but her voicemail was not set up to leave a message.  Patient was sent medication assistance forms that have not been returned.  Plan: Call patient back in 4-6 weeks.   Elayne Guerin, PharmD, [place] Clinical Pharmacist [phone]

## 2019-08-06 ENCOUNTER — Other Ambulatory Visit: Payer: Self-pay | Admitting: Hematology

## 2019-08-09 ENCOUNTER — Telehealth: Payer: Self-pay | Admitting: Hematology

## 2019-08-09 ENCOUNTER — Encounter (INDEPENDENT_AMBULATORY_CARE_PROVIDER_SITE_OTHER): Payer: Medicare Other | Admitting: Ophthalmology

## 2019-08-09 NOTE — Telephone Encounter (Signed)
R/s appt per 9/18 sch message - unable to reach pt . Left message with new appt date and time

## 2019-08-11 ENCOUNTER — Inpatient Hospital Stay: Payer: Medicare Other

## 2019-08-11 ENCOUNTER — Inpatient Hospital Stay: Payer: Medicare Other | Admitting: Hematology

## 2019-08-11 ENCOUNTER — Ambulatory Visit: Payer: Medicare Other

## 2019-08-13 ENCOUNTER — Telehealth: Payer: Self-pay

## 2019-08-13 NOTE — Telephone Encounter (Signed)
Patient calls nurse line stating she is taking Trazadone 100mg  at night and is still not able to sleep. Patient doesn't think she should take anymore without your advice. I offered her an apt via phone or in person, however you have nothing until 10/5 and she has an orthopedic apt that day. Please advise.

## 2019-08-16 NOTE — Telephone Encounter (Signed)
Called patient but had to leave voicemail. Her RLS and sleep continues to be difficult to treat.   If she wants to stop taking the trazodone, that is fine but she will likely have more issues with sleeping. Per her med list, she is on nicotine patches which could be contributing. I can send in a lower dose patch if she wants to try that. She should also come for an appointment to discuss further.

## 2019-08-17 ENCOUNTER — Encounter (INDEPENDENT_AMBULATORY_CARE_PROVIDER_SITE_OTHER): Payer: Medicare Other | Admitting: Ophthalmology

## 2019-08-17 ENCOUNTER — Other Ambulatory Visit: Payer: Self-pay | Admitting: Family Medicine

## 2019-08-17 NOTE — Telephone Encounter (Signed)
Noted and agree. I will update her med list to reflect she is not on nicotine anymore. If she continues to have difficulties, she should come in to be seen.

## 2019-08-17 NOTE — Telephone Encounter (Signed)
Spoke with patient and she states that she will continue on trazadone but will try taking it an hour earlier.  She usually takes it around 9 and then it takes a couples to start working but she will try taking it at 8 and see if this helps.  Patient states that she isn't using the nicotine patches and hasn't for "quite a while".  Will forward to MD to update.  No appt made at this time.  Diangelo Radel,CMA

## 2019-08-19 ENCOUNTER — Telehealth: Payer: Self-pay | Admitting: *Deleted

## 2019-08-19 NOTE — Telephone Encounter (Signed)
Attempted to reach patient but voicemail was full.  Will try to call patient again to see if I can get her scheduled for GERI clinic.  Mickel Schreur,CMA

## 2019-08-19 NOTE — Telephone Encounter (Signed)
-----   Message from Valerie Roys, [name] sent at 07/16/2019 11:49 AM EDT ----- Regarding: GERI Needs geri appt for dementia concerns.  Paperwork given at appt with [facility].

## 2019-08-25 DIAGNOSIS — R0602 Shortness of breath: Secondary | ICD-10-CM | POA: Diagnosis not present

## 2019-08-25 DIAGNOSIS — R072 Precordial pain: Secondary | ICD-10-CM | POA: Diagnosis not present

## 2019-08-25 DIAGNOSIS — I10 Essential (primary) hypertension: Secondary | ICD-10-CM | POA: Diagnosis not present

## 2019-08-25 DIAGNOSIS — M25561 Pain in right knee: Secondary | ICD-10-CM | POA: Diagnosis not present

## 2019-08-25 DIAGNOSIS — I2510 Atherosclerotic heart disease of native coronary artery without angina pectoris: Secondary | ICD-10-CM | POA: Diagnosis not present

## 2019-08-26 ENCOUNTER — Other Ambulatory Visit: Payer: Self-pay | Admitting: Pharmacist

## 2019-08-26 NOTE — Patient Outreach (Signed)
[facility]) Care Management  08/26/2019  Doria Elsbree [birth year] [id number]   Patient was called to follow up on medication assistance forms that were sent to her for Eliquis. Her voicemail was full and a message could not be left. Called patient's daughter's Marianna Fuss) phone. Patient now lives with her mother and patient was on speaker phone with Marianna Fuss.  Patient said she received the forms but did not return them because she is not interested in patient assistance. She does not want to complete the process.   Plan: Close patient's case as she does not want to continue with medication assistance.  Elayne Guerin, PharmD, [place] Clinical Pharmacist [phone]

## 2019-08-27 ENCOUNTER — Encounter (INDEPENDENT_AMBULATORY_CARE_PROVIDER_SITE_OTHER): Payer: Medicare Other | Admitting: Ophthalmology

## 2019-08-30 ENCOUNTER — Telehealth: Payer: Self-pay | Admitting: Family Medicine

## 2019-08-30 DIAGNOSIS — M791 Myalgia, unspecified site: Secondary | ICD-10-CM | POA: Diagnosis not present

## 2019-08-30 DIAGNOSIS — M47816 Spondylosis without myelopathy or radiculopathy, lumbar region: Secondary | ICD-10-CM | POA: Diagnosis not present

## 2019-08-30 NOTE — Telephone Encounter (Signed)
**  After Hours/ Emergency Line Call**  Received a call to report that Martha Mora is having withdrawal from trazodone. Daughter Martha Mora also on the phone.    Patient reports she has been on trazadone and initially thought she was taking too much medications. Stopped meds all together. Reports low back pain and sweating. Feels like she is withdrawing from trazodone. Is also on lyrica. Has been off trazodone for 2 weeks. Symptoms began "a long time" ago. PCP thought it was restless leg syndrome and started lyrica. Patient reports she is very uncomfortable. Had a cortisone injection at [facility]. Patient's daughter reports she is withdrawing, agitated, anxious, sweating, disoriented, muscle pain, and joint pain, shaking. BP has also been elevated to 215/90, now 187/86. BP is elevated because patient is more anxious and scared that she stopped her trazodone too soon.   Discussed with on call pharmacist in the hospital who states someone on her dose of trazadone who ubruply stops can have similar symptoms of withdrawal. States that patient should be ok to re-start meds at her previous dose.   Called patient and her daughter back to explain recommendations learned from pharmacy. Recommended re-starting trazodone to stop withdrawal symptoms and to follow up in clinic in the AM. Was able to schedule ATC appointment at 4:10PM (patient request 2/2 transportation). Informed them that if symptoms do not improve after taking trazodone 50mg  or BP does not improve she needs to be seen in ED. Patient and daughter agreeable to plan.   Red flags discussed.  Will forward to PCP and Dr. Grandville Silos who will see patient in clinic.  Caroline More, DO PGY-3, [facility] 08/30/2019 9:01 PM

## 2019-08-31 ENCOUNTER — Ambulatory Visit: Payer: Medicare Other

## 2019-08-31 ENCOUNTER — Other Ambulatory Visit: Payer: Self-pay

## 2019-08-31 ENCOUNTER — Ambulatory Visit (INDEPENDENT_AMBULATORY_CARE_PROVIDER_SITE_OTHER): Payer: Medicare Other | Admitting: Family Medicine

## 2019-08-31 VITALS — BP 150/62 | HR 68

## 2019-08-31 DIAGNOSIS — I10 Essential (primary) hypertension: Secondary | ICD-10-CM

## 2019-08-31 DIAGNOSIS — G2581 Restless legs syndrome: Secondary | ICD-10-CM | POA: Diagnosis not present

## 2019-08-31 DIAGNOSIS — F419 Anxiety disorder, unspecified: Secondary | ICD-10-CM

## 2019-08-31 MED ORDER — PREGABALIN 150 MG PO CAPS: 150.0000 mg | ORAL_CAPSULE | Freq: Two times a day (BID) | ORAL | 0 refills | Status: DC

## 2019-08-31 NOTE — Progress Notes (Signed)
    Subjective:  Martha Mora is a 73 y.o. female who presents to the [facility] today with a chief complaint of restless leg, hypertension, anxiety.   HPI:  Patient called the after-hours line last night to discuss feelings of anxiety and elevated blood pressures with systolics up to [id number].  Patient had stopped taking her trazodone abruptly.  It was felt that patient had symptoms of withdrawal and was told to start her trazodone again with follow-up today.  Today patient says that she feels much better.  Her blood pressure is improved.  Says that her mood is improved.  Gad 7 was elevated at 14.  Patient says that her main complaint is her restless leg syndrome at night.  She is currently on Lyrica 150 mg nightly.  She has tried other things in the past such as ropinirole but did not tolerate this well.  Patient says she is taking up to 5000 mg of Tylenol a day to cope with the pain as well.  ROS: Per HPI   Objective:  Physical Exam: BP (!) 150/62   Pulse 68   SpO2 95%   Gen: NAD, resting comfortably CV: RRR with no murmurs appreciated Pulm: NWOB, CTAB with no crackles, wheezes, or rhonchi GI: Normal bowel sounds present. Soft, Nontender, Nondistended. MSK: no edema, cyanosis, or clubbing noted Skin: warm, dry Neuro: grossly normal, moves all extremities No results found for this or any previous visit (from the past 72 hour(s)).  GAD 7 : Generalized Anxiety Score 08/31/2019 04/28/2019  Nervous, Anxious, on Edge 3 3  Control/stop worrying 2 0  Worry too much - different things 2 0  Trouble relaxing 3 3  Restless 2 3  Easily annoyed or irritable 1 3  Afraid - awful might happen 1 0  Total GAD 7 Score 14 12  Anxiety Difficulty Very difficult Not difficult at all      Assessment/Plan:  Restless leg syndrome Patient presenting with complaint of restless leg syndrome.  Not having adequate control with Lyrica.  Will increase dose to 150 mg twice daily, this may also help with anxiety.   Patient follow-up in 2 to 3 weeks to discuss how she is doing.  Patient takes 5000 mg of Tylenol daily.  Discussed risk of this, recommend patient decreasing to no more than 2000 mg a day.  Anxiety Elevated gad 7 today, but sounds improved over symptoms last night.  Possibly having withdrawal from trazodone.  Patient was restarted on the trazodone, also increasing patient's Lyrica which may help patient's mood as well.  Patient follow-up on mood in 2 to 3 weeks.  Essential hypertension Significant elevated blood pressure last night, much improved today, possibly improved in the setting of restarting trazodone.  Patient mildly hypertensive today, also anxious and in pain.  Recheck blood pressure in 2 to 3 weeks after trying to get improvement in anxiety and restless leg syndrome.   Lab Orders  No laboratory test(s) ordered today    Meds ordered this encounter  Medications  . pregabalin (LYRICA) 150 MG capsule    Sig: Take 1 capsule (150 mg total) by mouth 2 (two) times daily.    Dispense:  90 capsule    Refill:  0      Marny Lowenstein, MD, MS FAMILY MEDICINE RESIDENT - PGY3 08/31/2019 4:40 PM

## 2019-08-31 NOTE — Assessment & Plan Note (Signed)
Patient presenting with complaint of restless leg syndrome.  Not having adequate control with Lyrica.  Will increase dose to 150 mg twice daily, this may also help with anxiety.  Patient follow-up in 2 to 3 weeks to discuss how she is doing.  Patient takes 5000 mg of Tylenol daily.  Discussed risk of this, recommend patient decreasing to no more than 2000 mg a day.

## 2019-08-31 NOTE — Assessment & Plan Note (Signed)
Elevated gad 7 today, but sounds improved over symptoms last night.  Possibly having withdrawal from trazodone.  Patient was restarted on the trazodone, also increasing patient's Lyrica which may help patient's mood as well.  Patient follow-up on mood in 2 to 3 weeks.

## 2019-08-31 NOTE — Assessment & Plan Note (Signed)
Significant elevated blood pressure last night, much improved today, possibly improved in the setting of restarting trazodone.  Patient mildly hypertensive today, also anxious and in pain.  Recheck blood pressure in 2 to 3 weeks after trying to get improvement in anxiety and restless leg syndrome.

## 2019-08-31 NOTE — Telephone Encounter (Signed)
appt made.  Jazmin Hartsell,CMA

## 2019-08-31 NOTE — Patient Instructions (Signed)

## 2019-09-05 ENCOUNTER — Other Ambulatory Visit: Payer: Self-pay | Admitting: Family Medicine

## 2019-09-06 NOTE — Telephone Encounter (Signed)
Refill provided. Can we let patient know she needs to follow up with Cardiology soon? Thanks yall!!

## 2019-09-08 NOTE — Telephone Encounter (Signed)
Attempted to reach pt. LVM of note. Salvatore Marvel, CMA

## 2019-09-10 DIAGNOSIS — E876 Hypokalemia: Secondary | ICD-10-CM | POA: Diagnosis not present

## 2019-09-10 DIAGNOSIS — E559 Vitamin D deficiency, unspecified: Secondary | ICD-10-CM | POA: Diagnosis not present

## 2019-09-10 DIAGNOSIS — D649 Anemia, unspecified: Secondary | ICD-10-CM | POA: Diagnosis not present

## 2019-09-10 DIAGNOSIS — E7849 Other hyperlipidemia: Secondary | ICD-10-CM | POA: Diagnosis not present

## 2019-09-10 DIAGNOSIS — Z79899 Other long term (current) drug therapy: Secondary | ICD-10-CM | POA: Diagnosis not present

## 2019-09-15 ENCOUNTER — Encounter (INDEPENDENT_AMBULATORY_CARE_PROVIDER_SITE_OTHER): Payer: Medicare Other | Admitting: Ophthalmology

## 2019-09-15 DIAGNOSIS — H33302 Unspecified retinal break, left eye: Secondary | ICD-10-CM

## 2019-09-15 DIAGNOSIS — H35033 Hypertensive retinopathy, bilateral: Secondary | ICD-10-CM | POA: Diagnosis not present

## 2019-09-15 DIAGNOSIS — H43813 Vitreous degeneration, bilateral: Secondary | ICD-10-CM

## 2019-09-15 DIAGNOSIS — I10 Essential (primary) hypertension: Secondary | ICD-10-CM | POA: Diagnosis not present

## 2019-09-15 DIAGNOSIS — H353231 Exudative age-related macular degeneration, bilateral, with active choroidal neovascularization: Secondary | ICD-10-CM | POA: Diagnosis not present

## 2019-09-22 MED ORDER — ALBUTEROL SULFATE HFA 108 (90 BASE) MCG/ACT IN AERS: 2.0000 | INHALATION_SPRAY | Freq: Four times a day (QID) | RESPIRATORY_TRACT | 1 refills | Status: DC | PRN

## 2019-09-22 NOTE — Addendum Note (Signed)
Addended by: Salvatore Marvel on: 09/22/2019 09:51 AM   Modules accepted: Orders

## 2019-09-27 ENCOUNTER — Ambulatory Visit: Payer: Medicare Other | Admitting: Family Medicine

## 2019-09-27 NOTE — Progress Notes (Deleted)
  Subjective:   Patient ID: Martha Mora    DOB: [birth year], 73 y.o. female   MRN: [id number]  Martha Mora is a 73 y.o. female with a history of HTN, CAD, OSA, diverticulosis, GERD, IBS, h/o breast cancer, anxiety, HLD, obesity, RLS here for   Anxiety - Medications: *** - Taking: *** - Current stressors: *** - Coping Mechanisms: ***  RLS - on lyrica, recently increased 150mg  BID. Also on trazodone 50-100mg  prn for sleep - ***  Review of Systems:  Per HPI.  Medications and smoking status reviewed.  Objective:   There were no vitals taken for this visit. Vitals and nursing note reviewed.  General: well nourished, well developed, in no acute distress with non-toxic appearance HEENT: normocephalic, atraumatic, moist mucous membranes Neck: supple, non-tender without lymphadenopathy CV: regular rate and rhythm without murmurs, rubs, or gallops, no lower extremity edema Lungs: clear to auscultation bilaterally with normal work of breathing Abdomen: soft, non-tender, non-distended, no masses or organomegaly palpable, normoactive bowel sounds Skin: warm, dry, no rashes or lesions Extremities: warm and well perfused, normal tone MSK: ROM grossly intact, gait normal Neuro: Alert and oriented, speech normal  Assessment & Plan:   No problem-specific Assessment & Plan notes found for this encounter.  No orders of the defined types were placed in this encounter.  No orders of the defined types were placed in this encounter.   [name], DO PGY-3, [place] [facility] 09/27/2019 8:17 AM

## 2019-10-04 ENCOUNTER — Other Ambulatory Visit: Payer: Medicare Other

## 2019-10-04 ENCOUNTER — Ambulatory Visit: Payer: Medicare Other

## 2019-10-04 ENCOUNTER — Other Ambulatory Visit: Payer: Self-pay | Admitting: Nurse Practitioner

## 2019-10-04 DIAGNOSIS — Z170 Estrogen receptor positive status [ER+]: Secondary | ICD-10-CM

## 2019-10-04 DIAGNOSIS — C50511 Malignant neoplasm of lower-outer quadrant of right female breast: Secondary | ICD-10-CM
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
## 2019-10-20 ENCOUNTER — Inpatient Hospital Stay: Payer: Medicare Other

## 2019-10-20 ENCOUNTER — Inpatient Hospital Stay (HOSPITAL_BASED_OUTPATIENT_CLINIC_OR_DEPARTMENT_OTHER): Payer: Medicare Other | Admitting: Hematology

## 2019-10-20 ENCOUNTER — Other Ambulatory Visit: Payer: Self-pay

## 2019-10-20 ENCOUNTER — Inpatient Hospital Stay: Payer: Medicare Other | Attending: Hematology

## 2019-10-20 ENCOUNTER — Encounter: Payer: Self-pay | Admitting: Hematology

## 2019-10-20 VITALS — BP 152/65 | HR 62 | Temp 98.7°F | Resp 17 | Ht 61.0 in | Wt 231.5 lb

## 2019-10-20 DIAGNOSIS — Z170 Estrogen receptor positive status [ER+]: Secondary | ICD-10-CM

## 2019-10-20 DIAGNOSIS — C50511 Malignant neoplasm of lower-outer quadrant of right female breast: Secondary | ICD-10-CM

## 2019-10-20 DIAGNOSIS — Z853 Personal history of malignant neoplasm of breast: Secondary | ICD-10-CM | POA: Insufficient documentation

## 2019-10-20 DIAGNOSIS — M8580 Other specified disorders of bone density and structure, unspecified site: Secondary | ICD-10-CM | POA: Diagnosis not present

## 2019-10-20 DIAGNOSIS — I10 Essential (primary) hypertension: Secondary | ICD-10-CM | POA: Diagnosis not present

## 2019-10-20 LAB — COMPREHENSIVE METABOLIC PANEL
ALT: 12 U/L (ref 0–44)
AST: 28 U/L (ref 15–41)
Albumin: 3.4 g/dL — ABNORMAL LOW (ref 3.5–5.0)
Alkaline Phosphatase: 78 U/L (ref 38–126)
Anion gap: 13 (ref 5–15)
BUN: 12 mg/dL (ref 8–23)
CO2: 24 mmol/L (ref 22–32)
Calcium: 9.5 mg/dL (ref 8.9–10.3)
Chloride: 106 mmol/L (ref 98–111)
Creatinine, Ser: 0.84 mg/dL (ref 0.44–1.00)
GFR calc Af Amer: >60 mL/min (ref >60)
GFR calc non Af Amer: >60 mL/min (ref >60)
Glucose, Bld: 104 mg/dL — ABNORMAL HIGH (ref 70–99)
Potassium: 4.2 mmol/L (ref 3.5–5.1)
Sodium: 143 mmol/L (ref 135–145)
Total Bilirubin: 0.2 mg/dL — ABNORMAL LOW (ref 0.3–1.2)
Total Protein: 6.4 g/dL — ABNORMAL LOW (ref 6.5–8.1)

## 2019-10-20 LAB — CBC WITH DIFFERENTIAL/PLATELET
Abs Immature Granulocytes: 0.04 10*3/uL (ref 0.00–0.07)
Basophils Absolute: 0.1 10*3/uL (ref 0.0–0.1)
Basophils Relative: 1 %
Eosinophils Absolute: 0.3 10*3/uL (ref 0.0–0.5)
Eosinophils Relative: 3 %
HCT: 38.6 % (ref 36.0–46.0)
Hemoglobin: 11.5 g/dL — ABNORMAL LOW (ref 12.0–15.0)
Immature Granulocytes: 0 %
Lymphocytes Relative: 18 %
Lymphs Abs: 1.8 10*3/uL (ref 0.7–4.0)
MCH: 27.7 pg (ref 26.0–34.0)
MCHC: 29.8 g/dL — ABNORMAL LOW (ref 30.0–36.0)
MCV: 93 fL (ref 80.0–100.0)
Monocytes Absolute: 0.8 10*3/uL (ref 0.1–1.0)
Monocytes Relative: 8 %
Neutro Abs: 6.9 10*3/uL (ref 1.7–7.7)
Neutrophils Relative %: 70 %
Platelets: 250 10*3/uL (ref 150–400)
RBC: 4.15 MIL/uL (ref 3.87–5.11)
RDW: 14.7 % (ref 11.5–15.5)
WBC: 10 10*3/uL (ref 4.0–10.5)
nRBC: 0 % (ref 0.0–0.2)

## 2019-10-20 MED ORDER — DENOSUMAB 60 MG/ML ~~LOC~~ SOSY
PREFILLED_SYRINGE | SUBCUTANEOUS | Status: AC
  Filled 2019-10-20: qty 1

## 2019-10-20 MED ORDER — DENOSUMAB 60 MG/ML ~~LOC~~ SOSY
60.0000 mg | PREFILLED_SYRINGE | Freq: Once | SUBCUTANEOUS | Status: AC
  Administered 2019-10-20: 60 mg via SUBCUTANEOUS

## 2019-10-20 NOTE — Patient Instructions (Signed)

## 2019-10-21 ENCOUNTER — Telehealth: Payer: Self-pay | Admitting: Hematology

## 2019-10-21 NOTE — Telephone Encounter (Signed)
Scheduled appt per 12/2 los.  Voice mail box was full.  A calendar will be mailed out.

## 2019-10-22 DIAGNOSIS — N200 Calculus of kidney: Secondary | ICD-10-CM | POA: Diagnosis not present

## 2019-10-22 DIAGNOSIS — R3915 Urgency of urination: Secondary | ICD-10-CM | POA: Diagnosis not present

## 2019-10-25 NOTE — Progress Notes (Deleted)
Patient presents today for initial geriatric clinic visit.  Address BP control  -S/sx of hypotension? -if uncontrolled - consider amlodipine 5 mg daily or indapamide 1.25 mg daily  Assess calcium intake  Objective:  CrCl: 64 mL/min (adjBW)  EGFR (10/20/19): >60 mL/min/1.50m2  BMP Latest Ref Rng & Units 10/20/2019 06/30/2019 06/11/2019  Glucose 70 - 99 mg/dL 104(H) 152(H) 85  BUN 8 - 23 mg/dL '12 13 15  ' Creatinine 0.44 - 1.00 mg/dL 0.84 0.84 0.86  BUN/Creat Ratio 12 - 28 - - 17  Sodium 135 - 145 mmol/L 143 143 141  Potassium 3.5 - 5.1 mmol/L 4.2 4.2 4.4  Chloride 98 - 111 mmol/L 106 109 105  CO2 22 - 32 mmol/L '24 24 23  ' Calcium 8.9 - 10.3 mg/dL 9.5 9.0 8.6(L)   Immunization History  Administered Date(s) Administered  . Influenza, High Dose Seasonal PF 08/04/2018  . Influenza,inj,Quad PF,6+ Mos 07/20/2015, 08/05/2016, 07/10/2017  . Influenza-Unspecified 09/09/2012, 08/02/2014, 08/19/2019  . Pneumococcal Conjugate-13 05/23/2014  . Pneumococcal Polysaccharide-23 10/01/2012  . Tdap 05/23/2014   Assessment/Plan:  Medication Reconciliation A drug regimen assessment was performed, including review of allergies, interactions, disease-state management, dosing and immunization history. All medications are dosed appropriately according to renal function. Medications were reviewed with the patient, including name, instructions, indication, goals of therapy, potential side effects, importance of adherence, and safe use. Initiated Discontinued Changed 1. Omeprazole to famotidine 20 mg BID  If patient does not want to switch to famotidine then must change calcium carbonate to calcium citrate 2. Can switch from iron sulfate 325 mg daily to every other day for better absorption/tolerabilitY  How long to continue treatment? 3. Consider switching antidepressant to sertraline 25 mg daily or duloxetine 30 mg daily (may help w pain?  QTc 480 on 06/30/2019 (although patient does have Afib) 4.  Switch from lovaza to vascepa (CAD) -- however 30-day supply is $100 and 90-day supply is $290 so discontinue if fish oil if not willing to pay  Medication Related Fall Risk Patient had/has not had a recent fall. Patient does not appear to be taking any of the following mediation classes: anticholinergics, first generation antihistamines, skeletal muscle relaxants, barbiturates, benzodiazepines, hypnotics, TCAs, antipsychotics, alpha-1 antagonists, central-alpha agonists, and/or sulfonylureas. Patient is taking losartan 100 mg daily for blood pressure management and confirms she is/is not experiencing any episodes or signs/symptoms of hypotension. Patient does not appear to be at an increased risk of medication-related falls at this time.  1. Continue to monitor.   Immunizations Patient has not received influenzae or shingles vaccine. 1. Plan to administer flu and shingrix vaccines    Thank you for involving pharmacy to assist in providing Martha Mora's care.   MDrexel Iha PharmD PGY2 Ambulatory Care Pharmacy Resident

## 2019-10-27 ENCOUNTER — Encounter (INDEPENDENT_AMBULATORY_CARE_PROVIDER_SITE_OTHER): Payer: Medicare Other | Admitting: Ophthalmology

## 2019-10-28 ENCOUNTER — Ambulatory Visit: Payer: Medicare Other

## 2019-10-28 ENCOUNTER — Ambulatory Visit: Payer: Medicare Other | Admitting: Physical Therapy

## 2019-11-01 ENCOUNTER — Telehealth: Payer: Self-pay | Admitting: Pharmacist

## 2019-11-01 DIAGNOSIS — K2100 Gastro-esophageal reflux disease with esophagitis, without bleeding: Secondary | ICD-10-CM

## 2019-11-01 DIAGNOSIS — Z23 Encounter for immunization: Secondary | ICD-10-CM

## 2019-11-01 DIAGNOSIS — K219 Gastro-esophageal reflux disease without esophagitis: Secondary | ICD-10-CM

## 2019-11-01 MED ORDER — FAMOTIDINE 20 MG PO TABS: 20.0000 mg | ORAL_TABLET | Freq: Two times a day (BID) | ORAL | 3 refills | Status: DC

## 2019-11-01 NOTE — Telephone Encounter (Signed)
Patient contacted via telephone for medication reconciliation due to how she was unable to come into the office for prior geri clinic appt on 10/28/2019. She complains of having insomnia and only being able to sleep 1 hour per night. Patient has concerns that insomnia is medication related.  Objective:   CrCl: 64 mL/min (adjBW)   EGFR (10/20/2019): >60 mL/min/1.43m2  BMP Latest Ref Rng & Units 10/20/2019 06/30/2019 06/11/2019  Glucose 70 - 99 mg/dL 104(H) 152(H) 85  BUN 8 - 23 mg/dL '12 13 15  ' Creatinine 0.44 - 1.00 mg/dL 0.84 0.84 0.86  BUN/Creat Ratio 12 - 28 - - 17  Sodium 135 - 145 mmol/L 143 143 141  Potassium 3.5 - 5.1 mmol/L 4.2 4.2 4.4  Chloride 98 - 111 mmol/L 106 109 105  CO2 22 - 32 mmol/L '24 24 23  ' Calcium 8.9 - 10.3 mg/dL 9.5 9.0 8.6(L)    Immunization History  Administered Date(s) Administered  . Fluad Quad(high Dose 65+) 07/31/2019  . Influenza, High Dose Seasonal PF 08/04/2018  . Influenza,inj,Quad PF,6+ Mos 07/20/2015, 08/05/2016, 07/10/2017  . Influenza-Unspecified 09/09/2012, 08/02/2014, 08/19/2019  . Pneumococcal Conjugate-13 05/23/2014  . Pneumococcal Polysaccharide-23 10/01/2012  . Tdap 05/23/2014      Assessment/Plan:  Insomnia Informed patient it is unlikely insomnia is due to medications she is currently using. Discussed proper sleep hygiene in depth and melatonin use with patient. It appears patient enjoys to play games on her cellphone and watch TV immediately before going to bed. She states she will try to limit her exposure to her cellphone and TV prior to bed. Patient has used melatonin in the past and felt it was ineffective. She may consider a retrial of melatonin in the future.  Medication Reconciliation A drug regimen assessment was performed, including review of allergies, interactions, disease-state management, dosing and immunization history. All medications are dosed appropriately according to renal function. Medications were reviewed with the  patient, including name, instructions, indication, goals of therapy, potential side effects, importance of adherence, and safe use. Initiated -Famotidine 20 mg twice daily Discontinued -Omeprazole 40 mg daily Changed -Tylenol 1500 mg daily in the morning and 2000 mg daily in the evening to Tylenol 1500 mg daily in the morning and 1500 mg daily in the evening (patient states this is the only thing that manages pain; discussed the risk of hepatic impairment; patient verbalized understanding)   Medication Related Fall Risk Patient has not had a recent fall. Patient does not appear to be taking any of the following mediation classes: anticholinergics, first generation antihistamines, skeletal muscle relaxants, barbiturates, benzodiazepines, hypnotics, TCAs, antipsychotics, alpha-1 antagonists, central-alpha agonists, and/or sulfonylureas. Patient does not appear to be at an increased risk of medication-related falls at this time.    Immunizations Patient has not received shingles vaccine. Discussed how patient is eligible for her shingles vaccine and is able to obtain vaccination at her local pharmacy. Patient verbalized understanding and stated she will [facility]    Thank you for involving pharmacy to assist in providing Ms. Atteberry's care.    MDrexel Iha PharmD PGY2 Ambulatory Care Pharmacy Resident

## 2019-11-02 MED ORDER — ZOSTER VAC RECOMB ADJUVANTED 50 MCG/0.5ML IM SUSR: 0.5000 mL | Freq: Once | INTRAMUSCULAR | 0 refills | Status: DC

## 2019-11-02 NOTE — Addendum Note (Signed)
Addended by: Valerie Roys on: 11/02/2019 08:19 AM   Modules accepted: Orders

## 2019-11-02 NOTE — Telephone Encounter (Signed)
Sent shingles vaccine into pharmacy.  Will forward this to MD so she is updated on medication reconciliation with patient. Ziomara Birenbaum,CMA

## 2019-11-02 NOTE — Telephone Encounter (Signed)
Agree with below. Thanks all!

## 2019-11-03 MED ORDER — ZOSTER VAC RECOMB ADJUVANTED 50 MCG/0.5ML IM SUSR: 0.5000 mL | Freq: Once | INTRAMUSCULAR | 0 refills | Status: AC

## 2019-11-03 NOTE — Addendum Note (Signed)
Addended by: Valerie Roys on: 11/03/2019 08:14 AM   Modules accepted: Orders

## 2019-11-08 ENCOUNTER — Telehealth: Payer: Self-pay

## 2019-11-08 NOTE — Telephone Encounter (Signed)
Patient calls nurse line stating she has been taking 1 lyrica before bed and only has relief for a few hours. Patient states she has not been taking twice a day. Patient stated when she took one in am she was dizzy most of the morning. Patient is wondering if she can take two lyrica before bed? Please advise.

## 2019-11-09 ENCOUNTER — Encounter (INDEPENDENT_AMBULATORY_CARE_PROVIDER_SITE_OTHER): Payer: Medicare Other | Admitting: Ophthalmology

## 2019-11-15 NOTE — Telephone Encounter (Signed)
LM for patient ok per DPR with message from MD.  Zetta Stoneman,CMA  

## 2019-11-15 NOTE — Telephone Encounter (Signed)
This is fine. She should also focus on appropriate sleep hygiene as previously discussed with pharmacy and myself (no TV/phone/tablet/computer 1-2 hours before bed, no caffeine after 3 pm, no naps during the day, regular exercise).

## 2019-11-17 ENCOUNTER — Other Ambulatory Visit: Payer: Self-pay | Admitting: *Deleted

## 2019-11-17 DIAGNOSIS — G2581 Restless legs syndrome: Secondary | ICD-10-CM

## 2019-11-17 DIAGNOSIS — F419 Anxiety disorder, unspecified: Secondary | ICD-10-CM

## 2019-11-19 MED ORDER — PREGABALIN 150 MG PO CAPS: 150.0000 mg | ORAL_CAPSULE | Freq: Two times a day (BID) | ORAL | 0 refills | Status: DC

## 2019-11-22 ENCOUNTER — Inpatient Hospital Stay (HOSPITAL_COMMUNITY)
Admission: EM | Admit: 2019-11-22 | Discharge: 2019-11-28 | DRG: 871 | Disposition: A | Discharge status: Home / Self Care | Payer: Medicare Other | Attending: Family Medicine | Admitting: Family Medicine

## 2019-11-22 ENCOUNTER — Encounter (HOSPITAL_COMMUNITY): Payer: Self-pay

## 2019-11-22 ENCOUNTER — Other Ambulatory Visit: Payer: Self-pay | Admitting: Student in an Organized Health Care Education/Training Program

## 2019-11-22 ENCOUNTER — Other Ambulatory Visit: Payer: Self-pay

## 2019-11-22 DIAGNOSIS — Z4682 Encounter for fitting and adjustment of non-vascular catheter: Secondary | ICD-10-CM | POA: Diagnosis not present

## 2019-11-22 DIAGNOSIS — Z955 Presence of coronary angioplasty implant and graft: Secondary | ICD-10-CM

## 2019-11-22 DIAGNOSIS — Z853 Personal history of malignant neoplasm of breast: Secondary | ICD-10-CM

## 2019-11-22 DIAGNOSIS — B348 Other viral infections of unspecified site: Secondary | ICD-10-CM | POA: Diagnosis not present

## 2019-11-22 DIAGNOSIS — I4821 Permanent atrial fibrillation: Secondary | ICD-10-CM | POA: Diagnosis not present

## 2019-11-22 DIAGNOSIS — G4700 Insomnia, unspecified: Secondary | ICD-10-CM | POA: Diagnosis present

## 2019-11-22 DIAGNOSIS — Z781 Physical restraint status: Secondary | ICD-10-CM

## 2019-11-22 DIAGNOSIS — Z981 Arthrodesis status: Secondary | ICD-10-CM

## 2019-11-22 DIAGNOSIS — E669 Obesity, unspecified: Secondary | ICD-10-CM | POA: Diagnosis present

## 2019-11-22 DIAGNOSIS — I252 Old myocardial infarction: Secondary | ICD-10-CM

## 2019-11-22 DIAGNOSIS — E785 Hyperlipidemia, unspecified: Secondary | ICD-10-CM | POA: Diagnosis not present

## 2019-11-22 DIAGNOSIS — F40240 Claustrophobia: Secondary | ICD-10-CM | POA: Diagnosis present

## 2019-11-22 DIAGNOSIS — A419 Sepsis, unspecified organism: Principal | ICD-10-CM | POA: Diagnosis present

## 2019-11-22 DIAGNOSIS — N3946 Mixed incontinence: Secondary | ICD-10-CM | POA: Diagnosis present

## 2019-11-22 DIAGNOSIS — J9811 Atelectasis: Secondary | ICD-10-CM | POA: Diagnosis not present

## 2019-11-22 DIAGNOSIS — F329 Major depressive disorder, single episode, unspecified: Secondary | ICD-10-CM | POA: Diagnosis present

## 2019-11-22 DIAGNOSIS — M810 Age-related osteoporosis without current pathological fracture: Secondary | ICD-10-CM | POA: Diagnosis present

## 2019-11-22 DIAGNOSIS — G9340 Encephalopathy, unspecified: Secondary | ICD-10-CM | POA: Diagnosis not present

## 2019-11-22 DIAGNOSIS — B9789 Other viral agents as the cause of diseases classified elsewhere: Secondary | ICD-10-CM | POA: Diagnosis present

## 2019-11-22 DIAGNOSIS — M5489 Other dorsalgia: Secondary | ICD-10-CM | POA: Diagnosis not present

## 2019-11-22 DIAGNOSIS — I213 ST elevation (STEMI) myocardial infarction of unspecified site: Secondary | ICD-10-CM | POA: Diagnosis not present

## 2019-11-22 DIAGNOSIS — Z881 Allergy status to other antibiotic agents status: Secondary | ICD-10-CM

## 2019-11-22 DIAGNOSIS — K219 Gastro-esophageal reflux disease without esophagitis: Secondary | ICD-10-CM | POA: Diagnosis present

## 2019-11-22 DIAGNOSIS — Z85828 Personal history of other malignant neoplasm of skin: Secondary | ICD-10-CM

## 2019-11-22 DIAGNOSIS — R4181 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: Secondary | ICD-10-CM | POA: Diagnosis present

## 2019-11-22 DIAGNOSIS — F410 Panic disorder [episodic paroxysmal anxiety] without agoraphobia: Secondary | ICD-10-CM | POA: Diagnosis present

## 2019-11-22 DIAGNOSIS — Z743 Need for continuous supervision: Secondary | ICD-10-CM | POA: Diagnosis not present

## 2019-11-22 DIAGNOSIS — Z91013 Allergy to seafood: Secondary | ICD-10-CM

## 2019-11-22 DIAGNOSIS — J9601 Acute respiratory failure with hypoxia: Secondary | ICD-10-CM

## 2019-11-22 DIAGNOSIS — K5730 Diverticulosis of large intestine without perforation or abscess without bleeding: Secondary | ICD-10-CM | POA: Diagnosis not present

## 2019-11-22 DIAGNOSIS — G4733 Obstructive sleep apnea (adult) (pediatric): Secondary | ICD-10-CM | POA: Diagnosis present

## 2019-11-22 DIAGNOSIS — D539 Nutritional anemia, unspecified: Secondary | ICD-10-CM | POA: Diagnosis present

## 2019-11-22 DIAGNOSIS — Z6839 Body mass index (BMI) 39.0-39.9, adult: Secondary | ICD-10-CM

## 2019-11-22 DIAGNOSIS — F17290 Nicotine dependence, other tobacco product, uncomplicated: Secondary | ICD-10-CM | POA: Diagnosis present

## 2019-11-22 DIAGNOSIS — Z978 Presence of other specified devices: Secondary | ICD-10-CM

## 2019-11-22 DIAGNOSIS — Z20822 Contact with and (suspected) exposure to covid-19: Secondary | ICD-10-CM | POA: Diagnosis not present

## 2019-11-22 DIAGNOSIS — J440 Chronic obstructive pulmonary disease with acute lower respiratory infection: Secondary | ICD-10-CM | POA: Diagnosis present

## 2019-11-22 DIAGNOSIS — R918 Other nonspecific abnormal finding of lung field: Secondary | ICD-10-CM | POA: Diagnosis not present

## 2019-11-22 DIAGNOSIS — R042 Hemoptysis: Secondary | ICD-10-CM | POA: Diagnosis not present

## 2019-11-22 DIAGNOSIS — Z8719 Personal history of other diseases of the digestive system: Secondary | ICD-10-CM

## 2019-11-22 DIAGNOSIS — J189 Pneumonia, unspecified organism: Secondary | ICD-10-CM | POA: Diagnosis present

## 2019-11-22 DIAGNOSIS — D638 Anemia in other chronic diseases classified elsewhere: Secondary | ICD-10-CM | POA: Diagnosis present

## 2019-11-22 DIAGNOSIS — Z825 Family history of asthma and other chronic lower respiratory diseases: Secondary | ICD-10-CM

## 2019-11-22 DIAGNOSIS — R410 Disorientation, unspecified: Secondary | ICD-10-CM | POA: Diagnosis not present

## 2019-11-22 DIAGNOSIS — R0602 Shortness of breath: Secondary | ICD-10-CM

## 2019-11-22 DIAGNOSIS — K222 Esophageal obstruction: Secondary | ICD-10-CM | POA: Diagnosis present

## 2019-11-22 DIAGNOSIS — R064 Hyperventilation: Secondary | ICD-10-CM | POA: Diagnosis not present

## 2019-11-22 DIAGNOSIS — Z888 Allergy status to other drugs, medicaments and biological substances status: Secondary | ICD-10-CM

## 2019-11-22 DIAGNOSIS — J129 Viral pneumonia, unspecified: Secondary | ICD-10-CM | POA: Diagnosis not present

## 2019-11-22 DIAGNOSIS — I959 Hypotension, unspecified: Secondary | ICD-10-CM | POA: Diagnosis not present

## 2019-11-22 DIAGNOSIS — Z960 Presence of urogenital implants: Secondary | ICD-10-CM | POA: Diagnosis present

## 2019-11-22 DIAGNOSIS — I10 Essential (primary) hypertension: Secondary | ICD-10-CM | POA: Diagnosis not present

## 2019-11-22 DIAGNOSIS — I340 Nonrheumatic mitral (valve) insufficiency: Secondary | ICD-10-CM | POA: Diagnosis present

## 2019-11-22 DIAGNOSIS — Z880 Allergy status to penicillin: Secondary | ICD-10-CM

## 2019-11-22 DIAGNOSIS — I361 Nonrheumatic tricuspid (valve) insufficiency: Secondary | ICD-10-CM | POA: Diagnosis not present

## 2019-11-22 DIAGNOSIS — G2581 Restless legs syndrome: Secondary | ICD-10-CM | POA: Diagnosis not present

## 2019-11-22 DIAGNOSIS — R443 Hallucinations, unspecified: Secondary | ICD-10-CM | POA: Diagnosis not present

## 2019-11-22 DIAGNOSIS — R6521 Severe sepsis with septic shock: Secondary | ICD-10-CM | POA: Diagnosis not present

## 2019-11-22 DIAGNOSIS — Z716 Tobacco abuse counseling: Secondary | ICD-10-CM

## 2019-11-22 DIAGNOSIS — I2510 Atherosclerotic heart disease of native coronary artery without angina pectoris: Secondary | ICD-10-CM | POA: Diagnosis not present

## 2019-11-22 DIAGNOSIS — K589 Irritable bowel syndrome without diarrhea: Secondary | ICD-10-CM | POA: Diagnosis present

## 2019-11-22 DIAGNOSIS — Z818 Family history of other mental and behavioral disorders: Secondary | ICD-10-CM

## 2019-11-22 DIAGNOSIS — G9341 Metabolic encephalopathy: Secondary | ICD-10-CM | POA: Diagnosis present

## 2019-11-22 DIAGNOSIS — Z923 Personal history of irradiation: Secondary | ICD-10-CM

## 2019-11-22 DIAGNOSIS — Z8249 Family history of ischemic heart disease and other diseases of the circulatory system: Secondary | ICD-10-CM

## 2019-11-22 DIAGNOSIS — Z7901 Long term (current) use of anticoagulants: Secondary | ICD-10-CM

## 2019-11-22 DIAGNOSIS — Z9049 Acquired absence of other specified parts of digestive tract: Secondary | ICD-10-CM

## 2019-11-22 DIAGNOSIS — Z8262 Family history of osteoporosis: Secondary | ICD-10-CM

## 2019-11-22 DIAGNOSIS — G8929 Other chronic pain: Secondary | ICD-10-CM | POA: Diagnosis present

## 2019-11-22 LAB — POC SARS CORONAVIRUS 2 AG -  ED: SARS Coronavirus 2 Ag: NEGATIVE

## 2019-11-22 NOTE — Progress Notes (Signed)
**  After Hours/ Emergency Line Call*  Patient and daughter on phone to discuss patient's concerns.  She endorses increased SOB over the past few days. associated with rhinorrhea and watery eyes. These are typical symptoms of her allergies, but she does not take allergy medication. Denies fever, cough, sore throat, muscle aches.   At baseline, patient takes albuterol inhaler BID. Has a history of COPD and vapes. Her inhaler use has gone up to 3 times per day and does improve her symptoms for a short period of time. She notices when the medication wears of she has return of her SOB with chest tightness and wheezing. She is very concerned that she will not be able to breath and her daughter states this has brought on panic attacks.   They have been monitoring her O2 sats with an at home monitor which goes from 85-97.   O: Patient speaking in multiple full sentences without dyspnea. Has raspy voice. Sounds moderately anxious but alert and oriented.   A/P: - based on history alone, likely having an exacerbation of reactive airway disease brought on by worsening allergies or viral URI. Symptoms worsened by anxiety.  Patient does not have any other breathing treatments at home. Discussed with patient that at this time of night, she would need to go to ED for further treatment or if she felt well enough, she could call the clinic in the morning for an appointment.  She has chosen to call the ambulance. If this assumption is correct, patient's condition would likely improve with duoneb treatment.

## 2019-11-22 NOTE — ED Provider Notes (Addendum)
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 Orders    None       Antonietta Breach, PA-C 11/23/19 0319    Antonietta Breach, PA-C 11/23/19 0355    Antonietta Breach, PA-C 89/38/10 1751    Delora Fuel, MD 02/58/52 [phone]

## 2019-11-22 NOTE — ED Triage Notes (Signed)
Pt BIB [facility] for eval of shortness of breath. Pt initially called out for SOB, 82% on RA, EMS reports severe anxiety admin 5mg  versed en route for same. Pt is currently out of antianxiety meds and tranquilizers per EMS. EMS reports pt did develop some slight wheezing en route. Arrives on 10 LPM [facility]. Recently tested neg for covid

## 2019-11-23 ENCOUNTER — Emergency Department (HOSPITAL_COMMUNITY): Payer: Medicare Other

## 2019-11-23 ENCOUNTER — Inpatient Hospital Stay (HOSPITAL_COMMUNITY): Payer: Medicare Other

## 2019-11-23 ENCOUNTER — Other Ambulatory Visit: Payer: Self-pay

## 2019-11-23 DIAGNOSIS — F40240 Claustrophobia: Secondary | ICD-10-CM | POA: Diagnosis present

## 2019-11-23 DIAGNOSIS — D638 Anemia in other chronic diseases classified elsewhere: Secondary | ICD-10-CM | POA: Diagnosis present

## 2019-11-23 DIAGNOSIS — J129 Viral pneumonia, unspecified: Secondary | ICD-10-CM

## 2019-11-23 DIAGNOSIS — A419 Sepsis, unspecified organism: Secondary | ICD-10-CM | POA: Diagnosis present

## 2019-11-23 DIAGNOSIS — B348 Other viral infections of unspecified site: Secondary | ICD-10-CM

## 2019-11-23 DIAGNOSIS — R0602 Shortness of breath: Secondary | ICD-10-CM | POA: Diagnosis not present

## 2019-11-23 DIAGNOSIS — B9789 Other viral agents as the cause of diseases classified elsewhere: Secondary | ICD-10-CM | POA: Diagnosis present

## 2019-11-23 DIAGNOSIS — D539 Nutritional anemia, unspecified: Secondary | ICD-10-CM | POA: Diagnosis present

## 2019-11-23 DIAGNOSIS — J189 Pneumonia, unspecified organism: Secondary | ICD-10-CM | POA: Diagnosis not present

## 2019-11-23 DIAGNOSIS — I4821 Permanent atrial fibrillation: Secondary | ICD-10-CM | POA: Diagnosis not present

## 2019-11-23 DIAGNOSIS — K222 Esophageal obstruction: Secondary | ICD-10-CM | POA: Diagnosis not present

## 2019-11-23 DIAGNOSIS — G9340 Encephalopathy, unspecified: Secondary | ICD-10-CM | POA: Diagnosis not present

## 2019-11-23 DIAGNOSIS — R042 Hemoptysis: Secondary | ICD-10-CM | POA: Diagnosis not present

## 2019-11-23 DIAGNOSIS — F329 Major depressive disorder, single episode, unspecified: Secondary | ICD-10-CM | POA: Diagnosis present

## 2019-11-23 DIAGNOSIS — R410 Disorientation, unspecified: Secondary | ICD-10-CM | POA: Diagnosis present

## 2019-11-23 DIAGNOSIS — I361 Nonrheumatic tricuspid (valve) insufficiency: Secondary | ICD-10-CM

## 2019-11-23 DIAGNOSIS — I959 Hypotension, unspecified: Secondary | ICD-10-CM | POA: Diagnosis not present

## 2019-11-23 DIAGNOSIS — J440 Chronic obstructive pulmonary disease with acute lower respiratory infection: Secondary | ICD-10-CM | POA: Diagnosis not present

## 2019-11-23 DIAGNOSIS — K5730 Diverticulosis of large intestine without perforation or abscess without bleeding: Secondary | ICD-10-CM | POA: Diagnosis not present

## 2019-11-23 DIAGNOSIS — J9601 Acute respiratory failure with hypoxia: Secondary | ICD-10-CM

## 2019-11-23 DIAGNOSIS — R918 Other nonspecific abnormal finding of lung field: Secondary | ICD-10-CM | POA: Diagnosis not present

## 2019-11-23 DIAGNOSIS — R443 Hallucinations, unspecified: Secondary | ICD-10-CM | POA: Diagnosis not present

## 2019-11-23 DIAGNOSIS — R6521 Severe sepsis with septic shock: Secondary | ICD-10-CM | POA: Diagnosis not present

## 2019-11-23 DIAGNOSIS — J9811 Atelectasis: Secondary | ICD-10-CM | POA: Diagnosis present

## 2019-11-23 DIAGNOSIS — G2581 Restless legs syndrome: Secondary | ICD-10-CM | POA: Diagnosis present

## 2019-11-23 DIAGNOSIS — E785 Hyperlipidemia, unspecified: Secondary | ICD-10-CM | POA: Diagnosis present

## 2019-11-23 DIAGNOSIS — K219 Gastro-esophageal reflux disease without esophagitis: Secondary | ICD-10-CM | POA: Diagnosis present

## 2019-11-23 DIAGNOSIS — I252 Old myocardial infarction: Secondary | ICD-10-CM | POA: Diagnosis not present

## 2019-11-23 DIAGNOSIS — I10 Essential (primary) hypertension: Secondary | ICD-10-CM | POA: Diagnosis not present

## 2019-11-23 DIAGNOSIS — Z4682 Encounter for fitting and adjustment of non-vascular catheter: Secondary | ICD-10-CM | POA: Diagnosis not present

## 2019-11-23 DIAGNOSIS — Z20822 Contact with and (suspected) exposure to covid-19: Secondary | ICD-10-CM | POA: Diagnosis present

## 2019-11-23 DIAGNOSIS — E669 Obesity, unspecified: Secondary | ICD-10-CM | POA: Diagnosis present

## 2019-11-23 DIAGNOSIS — F410 Panic disorder [episodic paroxysmal anxiety] without agoraphobia: Secondary | ICD-10-CM | POA: Diagnosis present

## 2019-11-23 DIAGNOSIS — G9341 Metabolic encephalopathy: Secondary | ICD-10-CM | POA: Diagnosis not present

## 2019-11-23 DIAGNOSIS — I2510 Atherosclerotic heart disease of native coronary artery without angina pectoris: Secondary | ICD-10-CM | POA: Diagnosis present

## 2019-11-23 LAB — STREP PNEUMONIAE URINARY ANTIGEN: Strep Pneumo Urinary Antigen: NEGATIVE

## 2019-11-23 LAB — HEPARIN LEVEL (UNFRACTIONATED): Heparin Unfractionated: 0.94 IU/mL — ABNORMAL HIGH (ref 0.30–0.70)

## 2019-11-23 LAB — POCT I-STAT 7, (LYTES, BLD GAS, ICA,H+H)
Acid-base deficit: 4 mmol/L — ABNORMAL HIGH (ref 0.0–2.0)
Bicarbonate: 25.1 mmol/L (ref 20.0–28.0)
Bicarbonate: 22.1 mmol/L (ref 20.0–28.0)
Calcium, Ion: 1.17 mmol/L (ref 1.15–1.40)
Calcium, Ion: 1.07 mmol/L — ABNORMAL LOW (ref 1.15–1.40)
HCT: 34 % — ABNORMAL LOW (ref 36.0–46.0)
HCT: 29 % — ABNORMAL LOW (ref 36.0–46.0)
Hemoglobin: 11.6 g/dL — ABNORMAL LOW (ref 12.0–15.0)
Hemoglobin: 9.9 g/dL — ABNORMAL LOW (ref 12.0–15.0)
O2 Saturation: 94 %
O2 Saturation: 88 %
Patient temperature: 98.5
Patient temperature: 98.5
Potassium: 3.7 mmol/L (ref 3.5–5.1)
Potassium: 3.6 mmol/L (ref 3.5–5.1)
Sodium: 140 mmol/L (ref 135–145)
Sodium: 141 mmol/L (ref 135–145)
TCO2: 26 mmol/L (ref 22–32)
TCO2: 23 mmol/L (ref 22–32)
pCO2 arterial: 42.6 mmHg (ref 32.0–48.0)
pCO2 arterial: 42.9 mmHg (ref 32.0–48.0)
pH, Arterial: 7.378 (ref 7.350–7.450)
pH, Arterial: 7.32 — ABNORMAL LOW (ref 7.350–7.450)
pO2, Arterial: 72 mmHg — ABNORMAL LOW (ref 83.0–108.0)
pO2, Arterial: 60 mmHg — ABNORMAL LOW (ref 83.0–108.0)

## 2019-11-23 LAB — RESPIRATORY PANEL BY PCR

## 2019-11-23 LAB — URINALYSIS, ROUTINE W REFLEX MICROSCOPIC
Bilirubin Urine: NEGATIVE
Glucose, UA: NEGATIVE mg/dL
Ketones, ur: NEGATIVE mg/dL
Leukocytes,Ua: NEGATIVE
Nitrite: NEGATIVE
Protein, ur: 30 mg/dL — AB
Specific Gravity, Urine: 1.012 (ref 1.005–1.030)
pH: 5 (ref 5.0–8.0)

## 2019-11-23 LAB — LACTIC ACID, PLASMA
Lactic Acid, Venous: 1.3 mmol/L (ref 0.5–1.9)
Lactic Acid, Venous: 1.3 mmol/L (ref 0.5–1.9)

## 2019-11-23 LAB — CBC WITH DIFFERENTIAL/PLATELET
Abs Immature Granulocytes: 0.11 10*3/uL — ABNORMAL HIGH (ref 0.00–0.07)
Basophils Absolute: 0.1 10*3/uL (ref 0.0–0.1)
Basophils Relative: 1 %
Eosinophils Absolute: 0.2 10*3/uL (ref 0.0–0.5)
Eosinophils Relative: 1 %
HCT: 38.2 % (ref 36.0–46.0)
Hemoglobin: 11.1 g/dL — ABNORMAL LOW (ref 12.0–15.0)
Immature Granulocytes: 1 %
Lymphocytes Relative: 11 %
Lymphs Abs: 1.8 10*3/uL (ref 0.7–4.0)
MCH: 26.9 pg (ref 26.0–34.0)
MCHC: 29.1 g/dL — ABNORMAL LOW (ref 30.0–36.0)
MCV: 92.7 fL (ref 80.0–100.0)
Monocytes Absolute: 1.1 10*3/uL — ABNORMAL HIGH (ref 0.1–1.0)
Monocytes Relative: 7 %
Neutro Abs: 12.8 10*3/uL — ABNORMAL HIGH (ref 1.7–7.7)
Neutrophils Relative %: 79 %
Platelets: 261 10*3/uL (ref 150–400)
RBC: 4.12 MIL/uL (ref 3.87–5.11)
RDW: 15.3 % (ref 11.5–15.5)
WBC: 16.1 10*3/uL — ABNORMAL HIGH (ref 4.0–10.5)
nRBC: 0 % (ref 0.0–0.2)

## 2019-11-23 LAB — SARS CORONAVIRUS 2 (TAT 6-24 HRS): SARS Coronavirus 2: NEGATIVE

## 2019-11-23 LAB — MAGNESIUM
Magnesium: 2.2 mg/dL (ref 1.7–2.4)
Magnesium: 2 mg/dL (ref 1.7–2.4)

## 2019-11-23 LAB — BASIC METABOLIC PANEL
Anion gap: 10 (ref 5–15)
BUN: 15 mg/dL (ref 8–23)
CO2: 22 mmol/L (ref 22–32)
Calcium: 8.2 mg/dL — ABNORMAL LOW (ref 8.9–10.3)
Chloride: 109 mmol/L (ref 98–111)
Creatinine, Ser: 0.79 mg/dL (ref 0.44–1.00)
GFR calc Af Amer: >60 mL/min (ref >60)
GFR calc non Af Amer: >60 mL/min (ref >60)
Glucose, Bld: 115 mg/dL — ABNORMAL HIGH (ref 70–99)
Potassium: 4 mmol/L (ref 3.5–5.1)
Sodium: 141 mmol/L (ref 135–145)

## 2019-11-23 LAB — APTT: aPTT: 37 s — ABNORMAL HIGH (ref 24–36)

## 2019-11-23 LAB — ECHOCARDIOGRAM COMPLETE
Height: 61 in
Weight: 3360 oz

## 2019-11-23 LAB — IRON AND TIBC
Iron: 25 ug/dL — ABNORMAL LOW (ref 28–170)
Saturation Ratios: 7 % — ABNORMAL LOW (ref 10.4–31.8)
TIBC: 371 ug/dL (ref 250–450)
UIBC: 346 ug/dL

## 2019-11-23 LAB — RESPIRATORY PANEL BY RT PCR (FLU A&B, COVID)
Influenza A by PCR: NEGATIVE
Influenza B by PCR: NEGATIVE
SARS Coronavirus 2 by RT PCR: NEGATIVE

## 2019-11-23 LAB — RETICULOCYTES
Immature Retic Fract: 36.6 % — ABNORMAL HIGH (ref 2.3–15.9)
RBC.: 3.93 MIL/uL (ref 3.87–5.11)
Retic Count, Absolute: 102.6 10*3/uL (ref 19.0–186.0)
Retic Ct Pct: 2.6 % (ref 0.4–3.1)

## 2019-11-23 LAB — TROPONIN I (HIGH SENSITIVITY)
Troponin I (High Sensitivity): 9 ng/L (ref <18)
Troponin I (High Sensitivity): 43 ng/L — ABNORMAL HIGH (ref <18)

## 2019-11-23 LAB — TRIGLYCERIDES: Triglycerides: 91 mg/dL

## 2019-11-23 LAB — MRSA PCR SCREENING: MRSA by PCR: NEGATIVE

## 2019-11-23 LAB — BRAIN NATRIURETIC PEPTIDE: B Natriuretic Peptide: 174 pg/mL — ABNORMAL HIGH (ref 0.0–100.0)

## 2019-11-23 LAB — PROTIME-INR
INR: 1.1 (ref 0.8–1.2)
Prothrombin Time: 14.5 s (ref 11.4–15.2)

## 2019-11-23 LAB — PHOSPHORUS
Phosphorus: 3 mg/dL (ref 2.5–4.6)
Phosphorus: 3.1 mg/dL (ref 2.5–4.6)

## 2019-11-23 LAB — CORTISOL: Cortisol, Plasma: 24.4 ug/dL

## 2019-11-23 LAB — TSH: TSH: 1.163 u[IU]/mL (ref 0.350–4.500)

## 2019-11-23 LAB — GLUCOSE, CAPILLARY: Glucose-Capillary: 143 mg/dL — ABNORMAL HIGH (ref 70–99)

## 2019-11-23 MED ORDER — BISACODYL 10 MG RE SUPP: 10.0000 mg | Freq: Every day | RECTAL | Status: DC | PRN

## 2019-11-23 MED ORDER — HYDROCODONE-ACETAMINOPHEN 5-325 MG PO TABS
1.0000 | ORAL_TABLET | Freq: Once | ORAL | Status: AC
  Administered 2019-11-23: 1 via ORAL
  Filled 2019-11-23: qty 1

## 2019-11-23 MED ORDER — AMIODARONE HCL 200 MG PO TABS: 200.0000 mg | ORAL_TABLET | Freq: Every day | ORAL | Status: DC

## 2019-11-23 MED ORDER — SUCCINYLCHOLINE CHLORIDE 20 MG/ML IJ SOLN
INTRAMUSCULAR | Status: DC | PRN
  Administered 2019-11-23: 125 mg via INTRAVENOUS

## 2019-11-23 MED ORDER — ORAL CARE MOUTH RINSE
15.0000 mL | OROMUCOSAL | Status: DC
  Administered 2019-11-23 – 2019-11-24 (×6): 15 mL via OROMUCOSAL

## 2019-11-23 MED ORDER — HALOPERIDOL LACTATE 5 MG/ML IJ SOLN
2.0000 mg | Freq: Once | INTRAMUSCULAR | Status: AC
  Administered 2019-11-23: 2 mg via INTRAMUSCULAR
  Filled 2019-11-23: qty 1

## 2019-11-23 MED ORDER — MIDAZOLAM HCL 2 MG/2ML IJ SOLN: 1.0000 mg | INTRAMUSCULAR | Status: DC | PRN

## 2019-11-23 MED ORDER — HEPARIN BOLUS VIA INFUSION
3000.0000 [IU] | Freq: Once | INTRAVENOUS | Status: AC
  Administered 2019-11-23: 3000 [IU] via INTRAVENOUS
  Filled 2019-11-23: qty 3000

## 2019-11-23 MED ORDER — FENTANYL CITRATE (PF) 100 MCG/2ML IJ SOLN
25.0000 ug | INTRAMUSCULAR | Status: DC | PRN
  Administered 2019-11-23 – 2019-11-24 (×9): 100 ug via INTRAVENOUS
  Filled 2019-11-23 (×9): qty 2

## 2019-11-23 MED ORDER — NOREPINEPHRINE 4 MG/250ML-% IV SOLN
2.0000 ug/min | INTRAVENOUS | Status: DC
  Administered 2019-11-23: 2 ug/min via INTRAVENOUS

## 2019-11-23 MED ORDER — PROPOFOL 1000 MG/100ML IV EMUL
INTRAVENOUS | Status: AC
  Administered 2019-11-23: 20 ug/kg/min via INTRAVENOUS
  Filled 2019-11-23: qty 100

## 2019-11-23 MED ORDER — DEXMEDETOMIDINE HCL IN NACL 400 MCG/100ML IV SOLN
0.0000 ug/kg/h | INTRAVENOUS | Status: DC
  Administered 2019-11-23: 0.4 ug/kg/h via INTRAVENOUS
  Administered 2019-11-23 – 2019-11-24 (×3): 1.2 ug/kg/h via INTRAVENOUS
  Administered 2019-11-24: 1.4 ug/kg/h via INTRAVENOUS
  Filled 2019-11-23 (×8): qty 100

## 2019-11-23 MED ORDER — LORAZEPAM 2 MG/ML IJ SOLN
1.0000 mg | Freq: Once | INTRAMUSCULAR | Status: AC
  Administered 2019-11-23: 1 mg via INTRAVENOUS
  Filled 2019-11-23: qty 1

## 2019-11-23 MED ORDER — SUCCINYLCHOLINE CHLORIDE 20 MG/ML IJ SOLN
125.0000 mg | Freq: Once | INTRAMUSCULAR | Status: DC
  Filled 2019-11-23: qty 6.25

## 2019-11-23 MED ORDER — AZITHROMYCIN 250 MG PO TABS: 500.0000 mg | ORAL_TABLET | Freq: Every day | ORAL | Status: DC

## 2019-11-23 MED ORDER — AZITHROMYCIN 500 MG PO TABS
500.0000 mg | ORAL_TABLET | Freq: Every day | ORAL | Status: DC
  Administered 2019-11-23: 500 mg
  Filled 2019-11-23: qty 1

## 2019-11-23 MED ORDER — LORAZEPAM 2 MG/ML IJ SOLN
2.0000 mg | Freq: Once | INTRAMUSCULAR | Status: AC
  Administered 2019-11-23: 2 mg via INTRAVENOUS
  Filled 2019-11-23: qty 1

## 2019-11-23 MED ORDER — ETOMIDATE 2 MG/ML IV SOLN: 20.0000 mg | Freq: Once | INTRAVENOUS | Status: DC

## 2019-11-23 MED ORDER — FENTANYL CITRATE (PF) 100 MCG/2ML IJ SOLN: 50.0000 ug | INTRAMUSCULAR | Status: DC | PRN

## 2019-11-23 MED ORDER — PREGABALIN 25 MG PO CAPS
150.0000 mg | ORAL_CAPSULE | Freq: Once | ORAL | Status: AC
  Administered 2019-11-23: 150 mg via ORAL
  Filled 2019-11-23: qty 1

## 2019-11-23 MED ORDER — FUROSEMIDE 10 MG/ML IJ SOLN
40.0000 mg | Freq: Once | INTRAMUSCULAR | Status: AC
  Administered 2019-11-23: 40 mg via INTRAVENOUS
  Filled 2019-11-23: qty 4

## 2019-11-23 MED ORDER — VITAL HIGH PROTEIN PO LIQD
1000.0000 mL | ORAL | Status: DC
  Administered 2019-11-23: 1000 mL
  Filled 2019-11-23: qty 1000

## 2019-11-23 MED ORDER — HEPARIN BOLUS VIA INFUSION
3000.0000 [IU] | Freq: Once | INTRAVENOUS | Status: DC
  Filled 2019-11-23: qty 3000

## 2019-11-23 MED ORDER — ETOMIDATE 2 MG/ML IV SOLN
INTRAVENOUS | Status: AC | PRN
  Administered 2019-11-23: 20 mg via INTRAVENOUS

## 2019-11-23 MED ORDER — CHLORHEXIDINE GLUCONATE 0.12% ORAL RINSE (MEDLINE KIT)
15.0000 mL | Freq: Two times a day (BID) | OROMUCOSAL | Status: DC
  Administered 2019-11-23 – 2019-11-25 (×4): 15 mL via OROMUCOSAL

## 2019-11-23 MED ORDER — LACTATED RINGERS IV SOLN: INTRAVENOUS | Status: DC

## 2019-11-23 MED ORDER — HEPARIN (PORCINE) 25000 UT/250ML-% IV SOLN
1300.0000 [IU]/h | INTRAVENOUS | Status: DC
  Administered 2019-11-23: 1000 [IU]/h via INTRAVENOUS
  Filled 2019-11-23: qty 250

## 2019-11-23 MED ORDER — NOREPINEPHRINE 4 MG/250ML-% IV SOLN
INTRAVENOUS | Status: AC
  Filled 2019-11-23: qty 250

## 2019-11-23 MED ORDER — DOCUSATE SODIUM 50 MG/5ML PO LIQD
100.0000 mg | Freq: Two times a day (BID) | ORAL | Status: DC | PRN
  Filled 2019-11-23: qty 10

## 2019-11-23 MED ORDER — SODIUM CHLORIDE 0.9 % IV SOLN
1.0000 g | INTRAVENOUS | Status: DC
  Administered 2019-11-23 – 2019-11-26 (×3): 1 g via INTRAVENOUS
  Filled 2019-11-23: qty 10
  Filled 2019-11-23 (×2): qty 1

## 2019-11-23 MED ORDER — ROSUVASTATIN CALCIUM 20 MG PO TABS: 20.0000 mg | ORAL_TABLET | Freq: Every day | ORAL | Status: DC

## 2019-11-23 MED ORDER — AZITHROMYCIN 250 MG PO TABS
500.0000 mg | ORAL_TABLET | Freq: Once | ORAL | Status: AC
  Administered 2019-11-23: 500 mg via ORAL
  Filled 2019-11-23: qty 2

## 2019-11-23 MED ORDER — IPRATROPIUM-ALBUTEROL 0.5-2.5 (3) MG/3ML IN SOLN
3.0000 mL | RESPIRATORY_TRACT | Status: DC | PRN
  Administered 2019-11-24: 3 mL via RESPIRATORY_TRACT
  Filled 2019-11-23: qty 3

## 2019-11-23 MED ORDER — SODIUM CHLORIDE 0.9 % IV SOLN
1.0000 g | Freq: Once | INTRAVENOUS | Status: AC
  Administered 2019-11-23: 1 g via INTRAVENOUS
  Filled 2019-11-23: qty 10

## 2019-11-23 MED ORDER — IPRATROPIUM-ALBUTEROL 0.5-2.5 (3) MG/3ML IN SOLN
3.0000 mL | RESPIRATORY_TRACT | Status: DC
  Administered 2019-11-23 (×3): 3 mL via RESPIRATORY_TRACT
  Filled 2019-11-23 (×3): qty 3

## 2019-11-23 MED ORDER — SODIUM CHLORIDE 0.9 % IV SOLN
250.0000 mL | INTRAVENOUS | Status: DC
  Administered 2019-11-23: 250 mL via INTRAVENOUS

## 2019-11-23 MED ORDER — PANTOPRAZOLE SODIUM 40 MG PO PACK
40.0000 mg | PACK | ORAL | Status: DC
  Filled 2019-11-23 (×2): qty 20

## 2019-11-23 MED ORDER — FENTANYL CITRATE (PF) 100 MCG/2ML IJ SOLN
50.0000 ug | INTRAMUSCULAR | Status: DC | PRN
  Filled 2019-11-23: qty 2

## 2019-11-23 MED ORDER — IPRATROPIUM-ALBUTEROL 0.5-2.5 (3) MG/3ML IN SOLN
3.0000 mL | Freq: Once | RESPIRATORY_TRACT | Status: AC
  Administered 2019-11-23: 3 mL via RESPIRATORY_TRACT
  Filled 2019-11-23: qty 3

## 2019-11-23 MED ORDER — SODIUM CHLORIDE 0.9% FLUSH: 10.0000 mL | INTRAVENOUS | Status: DC | PRN

## 2019-11-23 MED ORDER — PROPOFOL 1000 MG/100ML IV EMUL: 0.0000 ug/kg/min | INTRAVENOUS | Status: DC

## 2019-11-23 MED ORDER — FENTANYL CITRATE (PF) 100 MCG/2ML IJ SOLN: 25.0000 ug | INTRAMUSCULAR | Status: DC | PRN

## 2019-11-23 MED ORDER — ALBUTEROL (5 MG/ML) CONTINUOUS INHALATION SOLN
10.0000 mg/h | INHALATION_SOLUTION | RESPIRATORY_TRACT | Status: DC
  Administered 2019-11-23: 10 mg/h via RESPIRATORY_TRACT

## 2019-11-23 MED ORDER — METHYLPREDNISOLONE SODIUM SUCC 125 MG IJ SOLR
125.0000 mg | Freq: Once | INTRAMUSCULAR | Status: AC
  Administered 2019-11-23: 125 mg via INTRAVENOUS
  Filled 2019-11-23: qty 2

## 2019-11-23 MED ORDER — PRO-STAT SUGAR FREE PO LIQD
30.0000 mL | Freq: Two times a day (BID) | ORAL | Status: DC
  Administered 2019-11-23: 30 mL
  Filled 2019-11-23 (×3): qty 30

## 2019-11-23 MED ORDER — PANTOPRAZOLE SODIUM 40 MG IV SOLR: 40.0000 mg | Freq: Every day | INTRAVENOUS | Status: DC

## 2019-11-23 MED ORDER — IOHEXOL 350 MG/ML SOLN
100.0000 mL | Freq: Once | INTRAVENOUS | Status: AC | PRN
  Administered 2019-11-23: 100 mL via INTRAVENOUS

## 2019-11-23 MED ORDER — AMIODARONE HCL 200 MG PO TABS
200.0000 mg | ORAL_TABLET | Freq: Every day | ORAL | Status: DC
  Administered 2019-11-24: 200 mg
  Filled 2019-11-23: qty 1

## 2019-11-23 MED ORDER — CHLORHEXIDINE GLUCONATE CLOTH 2 % EX PADS
6.0000 | MEDICATED_PAD | Freq: Every day | CUTANEOUS | Status: DC
  Administered 2019-11-23 – 2019-11-28 (×5): 6 via TOPICAL

## 2019-11-23 MED ORDER — ALBUTEROL SULFATE (2.5 MG/3ML) 0.083% IN NEBU
2.5000 mg | INHALATION_SOLUTION | RESPIRATORY_TRACT | Status: DC | PRN
  Administered 2019-11-23: 2.5 mg via RESPIRATORY_TRACT
  Filled 2019-11-23: qty 3

## 2019-11-23 MED ORDER — HEPARIN (PORCINE) 25000 UT/250ML-% IV SOLN
1000.0000 [IU]/h | INTRAVENOUS | Status: DC
  Filled 2019-11-23: qty 250

## 2019-11-23 MED ORDER — SODIUM CHLORIDE 0.9 % IV BOLUS
2000.0000 mL | Freq: Once | INTRAVENOUS | Status: AC
  Administered 2019-11-23: 2000 mL via INTRAVENOUS

## 2019-11-23 MED ORDER — SODIUM CHLORIDE 0.9% FLUSH
10.0000 mL | Freq: Two times a day (BID) | INTRAVENOUS | Status: DC
  Administered 2019-11-24 – 2019-11-28 (×8): 10 mL

## 2019-11-23 NOTE — ED Notes (Signed)
Report gotten from Any, pt very restless and agitated unable to tolerate breathing treatment. EDP to sedate pt and intubate.
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
## 2019-11-23 NOTE — ED Notes (Signed)
Paged admitting to [place]

## 2019-11-23 NOTE — ED Notes (Signed)
Admitting repaged to RN

## 2019-11-23 NOTE — ED Notes (Signed)
Pt to CT then to 2MW 14

## 2019-11-23 NOTE — ED Notes (Signed)
Pt continues to remove oxygen tubing and pull against restraints, pt follows commands for approx 30 seconds and then continues to remove equipment and O2. Restraints applied per order for pt safety to increase O2 sats
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
## 2019-11-23 NOTE — ED Notes (Signed)
Decision made to intubate by ED team d/t progressing respiratory failure, tachycardia and hypoxia. Admitting team paged and made aware of intubation.

## 2019-11-23 NOTE — Progress Notes (Signed)
ANTICOAGULATION CONSULT NOTE - Initial Consult  Pharmacy Consult for heparin Indication: atrial fibrillation   Patient Measurements: Height: 5\' 1"  (154.9 cm) Weight: 210 lb (95.3 kg) IBW/kg (Calculated) : 47.8 Heparin Dosing Weight: 70.4 kg  Vital Signs: BP: 116/55 (01/05 1300) Pulse Rate: 74 (01/05 1300)  Labs: Recent Labs    11/22/19 2333 11/23/19 0204 11/23/19 0249 11/23/19 0514 11/23/19 0800  HGB 11.1* 11.6*  --  9.9*  --   HCT 38.2 34.0*  --  29.0*  --   PLT 261  --   --   --   --   APTT  --   --   --   --  37*  LABPROT  --   --   --   --  14.5  INR  --   --   --   --  1.1  HEPARINUNFRC  --   --   --   --  0.94*  CREATININE 0.79  --   --   --   --   TROPONINIHS  --   --  9  --   --      Assessment: On Eliquis PTA for AFib. Her last dose was 11/22/19 in the morning. She is currently intubated and will be transitioned to heparin. Will use aPTT to guide heparin dosing. SCr stable, h/h plts wnl.   Goal of Therapy:  aPTT 66-102 seconds Monitor platelets by anticoagulation protocol: Yes   Plan:  -Heparin bolus 3000 units then 1000 units/hr -Daily HL, CBC, aPTT -Check level tonight   Harvel Quale 11/23/2019,1:50 PM

## 2019-11-23 NOTE — ED Notes (Signed)
BP still dropping Critical care Notified,pt to be started on Levophed IV.

## 2019-11-23 NOTE — Progress Notes (Signed)
ETT pulled back 2cm per NP Brooke. No complications noted.

## 2019-11-23 NOTE — ED Provider Notes (Signed)
Patient became progressively more agitated and started to get diaphoretic.  Oxygen saturations dropped and decision was made to intubate.  Portable CSR has been ordered. AProcedure Name: Intubation Date/Time: 11/23/2019 3:57 AM Performed by: Delora Fuel, MD Pre-anesthesia Checklist: Patient identified, Patient being monitored, Emergency Drugs available, Timeout performed and Suction available Oxygen Delivery Method: Non-rebreather mask Preoxygenation: Pre-oxygenation with 100% oxygen Induction Type: Rapid sequence Ventilation: Mask ventilation without difficulty Laryngoscope Size: Glidescope and 3 Grade View: Grade I Tube size: 7.5 mm Number of attempts: 1 Airway Equipment and Method: Rigid stylet and Video-laryngoscopy Placement Confirmation: ETT inserted through vocal cords under direct vision,  CO2 detector and Breath sounds checked- equal and bilateral Secured at: 24 cm Tube secured with: ETT holder Dental Injury: Teeth and Oropharynx as per pre-operative assessment      dmission is changed to Critical Care.   Delora Fuel, MD [id number] [phone]

## 2019-11-23 NOTE — ED Notes (Signed)
Pt continuously pulling off oxygen, desatting to the 70s, getting out of bed and removing clothing. Pt is non redirectable. Pt appears to be hallucinating, medication that was admin is wearing off and pt continues to fight O2 and self remove.

## 2019-11-23 NOTE — ED Notes (Signed)
Pt intubated, propofol initiated and BP dropped to low 70/30, bolus NS 2L given as ordered and Propofol stopped, pt continues to be very agitated and trying to pull lines and ET tube out. Continue with 4 points restrain.

## 2019-11-23 NOTE — ED Notes (Signed)
Waiting for resp to transport pt to 2 MW 14

## 2019-11-23 NOTE — ED Notes (Addendum)
Pt respiratory status significantly worse than on arrival. Pt w/ stridor on breath sounds, increasing O2 requiring Max on NRB, pt frantic, non redirectable. Concerns for progressing to respiratory failure and possibly respiratory arrest communicated to MD Winfrey by this RN while she was at bedside.

## 2019-11-23 NOTE — Progress Notes (Signed)
  Echocardiogram 2D Echocardiogram has been performed.  Martha Mora 11/23/2019, 2:29 PM

## 2019-11-23 NOTE — Progress Notes (Signed)
PCCM Interval note  Daughter Monika Salk [phone] updated by phone.  She reports patient has had trouble lately with insomnia and exacerbates her anxiety.  State that last time she was intubated on vent for septic shock that she withdrew from her chronic meds and is concerned about restarting them.  States they are worried she is having SE from her meds causing her insomnia.  She is additionally trying to stop smoking/ vaping and daughter worried about nicotine withdrawal.   Will need pharmacy to help [place] review her home med list for adverse reactions.     Kennieth Rad, MSN, AGACNP-BC [facility] 11/23/2019, 7:05 AM

## 2019-11-23 NOTE — ED Notes (Signed)
Pt becoming increasingly agitated, diaphoretic and frantic. Pulling against restraints. MD Winfrey aware of change in status.

## 2019-11-23 NOTE — ED Notes (Signed)
Admitting at bedside 

## 2019-11-23 NOTE — Progress Notes (Addendum)
FPTS Interim Progress Note  Went to assess Martha Mora after concerns from her nurse about worsening respiratory status.  On my arrival, she was asleep after receiving 2 mg Ativan IV for agitation.  She had a normal respiratory rate and was satting at 96% on 15 L nonrebreather but had worsening air movement in all lung fields on auscultation.  She was able to be awakened with sternal rub and immediately began to be agitated, but she went back to sleep after about 15 seconds.  I added methylprednisolone and continuous albuterol therapy.  I also consulted CCM for their assessment of this patient due to concerns for worsening respiratory status and inability to maintain airway.  They will come to assess her and decide if she needs to be transferred to their service.  We appreciate any recommendations they have.  Jeziah Kretschmer C. Shan Levans, MD PGY-3, [facility] 11/23/2019 3:19 AM

## 2019-11-23 NOTE — Consult Note (Signed)
NAME:  Martha Mora, MRN:  [id number], DOB:  14-Feb-1946, LOS: 0 ADMISSION DATE:  11/22/2019, CONSULTATION DATE:  11/22/2018 REFERRING MD:  Dr. Ardelia Mems, FPTS CHIEF COMPLAINT:  Resp failure  Brief History   74 yo female presented with dyspnea, wheeze, chest tightness.  Found to have acute hypoxic respiratory failure from pneumonia and positive for Rhinovirus.  Past Medical History  Tobacco abuse (vapes), OSA, Afib on eliquis, CAD, HTN, MVR, GERD, esophageal stricture, diverticulosis, IBS, OA, RLS/ chronic pain, mixed urinary incontinence  Significant Hospital Events   1/04 Admitted FPTS 1/05 intubated  Consults:    Procedures:  1/5 ETT >>  Significant Diagnostic Tests:  1/5 CTA PE >>  Micro Data:  SARS CoV2 PCR 1/05 >> negative Influenza PCR 1/05 >> A and B negative Blood 1/05 >> RVP 1/05 >> Rhinovirus Sputum 1/05 >> Pneumococcal Ag 1/05 >> Legionella Ag 1/05 >>  Antimicrobials:  1/5 azithromycin >> 1/5 ceftriaxone >>  Interim history/subjective:  Seen in ER.  Sedated, on pressors, on vent.  Objective   Blood pressure 136/64, pulse (!) 106, temperature 98.5 F (36.9 C), temperature source Oral, resp. rate (!) 26, height 5\' 1"  (1.549 m), weight 95.3 kg, SpO2 96 %.    Vent Mode: PRVC FiO2 (%):  [50 %-100 %] 50 % Set Rate:  [20 bmp] 20 bmp Vt Set:  [380 mL-480 mL] 380 mL PEEP:  [5 cmH20] 5 cmH20 Plateau Pressure:  [18 cmH20] 18 cmH20   Intake/Output Summary (Last 24 hours) at 11/23/2019 1315 Last data filed at 11/23/2019 0518 Gross per 24 hour  Intake 120 ml  Output --  Net 120 ml   Filed Weights   11/22/19 2305  Weight: 95.3 kg    Physical examination:  General - sedated Eyes - pupils reactive ENT - ETT in place Cardiac - regular rate/rhythm, no murmur Chest - faint b/l wheeze with scattered rhonchi Abdomen - soft, non tender, + bowel sounds Extremities - no cyanosis, clubbing, or edema Skin - no rashes Neuro - RASS -2   Resolved Hospital  Problem list     Assessment & Plan:   Acute hypoxic respiratory failure from Rhinovirus viral pneumonia with possible bacterial superinfection. - full vent support - f/u CXR - continue ABx - check legionella, pneumococcal urine Ag - f/u sputum culture  Septic shock from viral pneumonia. - pressors to keep MAP > 65  Acute metabolic encephalopathy from hypoxia and sepsis. Hx of chronic pain, RLS, anxiety. - RASS goal -1 to -2 - hold outpt citalopram, lyrica, trazodone, meclizine  Hx of CAD, Permanent A fib, HTN, HLD. - continue amiodarone, crestor - f/u Echo - hold outpt eliquis, losartan for now - heparin gtt per pharmacy    Best practice:  Diet: tube feeds DVT prophylaxis: heparin per pharmacy  GI prophylaxis: protonix Mobility: bed rest Code Status: Full  Disposition: ICU   Labs:   CMP Latest Ref Rng & Units 11/23/2019 11/23/2019 11/22/2019  Glucose 70 - 99 mg/dL - - 115(H)  BUN 8 - 23 mg/dL - - 15  Creatinine 0.44 - 1.00 mg/dL - - 0.79  Sodium 135 - 145 mmol/L 141 140 141  Potassium 3.5 - 5.1 mmol/L 3.6 3.7 4.0  Chloride 98 - 111 mmol/L - - 109  CO2 22 - 32 mmol/L - - 22  Calcium 8.9 - 10.3 mg/dL - - 8.2(L)  Total Protein 6.5 - 8.1 g/dL - - -  Total Bilirubin 0.3 - 1.2 mg/dL - - -  Alkaline  Phos 38 - 126 U/L - - -  AST 15 - 41 U/L - - -  ALT 0 - 44 U/L - - -    CBC Latest Ref Rng & Units 11/23/2019 11/23/2019 11/22/2019  WBC 4.0 - 10.5 K/uL - - 16.1(H)  Hemoglobin 12.0 - 15.0 g/dL 9.9(L) 11.6(L) 11.1(L)  Hematocrit 36.0 - 46.0 % 29.0(L) 34.0(L) 38.2  Platelets 150 - 400 K/uL - - 261    ABG    Component Value Date/Time   PHART 7.320 (L) 11/23/2019 0514   PCO2ART 42.9 11/23/2019 0514   PO2ART 60.0 (L) 11/23/2019 0514   HCO3 22.1 11/23/2019 0514   TCO2 23 11/23/2019 0514   ACIDBASEDEF 4.0 (H) 11/23/2019 0514   O2SAT 88.0 11/23/2019 0514    CBG (last 3)  No results for input(s): GLUCAP in the last 72 hours.   CC time 36 minutes  Chesley Mires, MD [facility]  Pulmonary/Critical Care 11/23/2019, 1:26 PM

## 2019-11-23 NOTE — ED Notes (Signed)
Critical care paged to RN

## 2019-11-23 NOTE — ED Notes (Signed)
Pt w/ new stridor. Admitting paged. Increased NRB to max setting. Satting 91%. Respiratory rate 26-28

## 2019-11-23 NOTE — Progress Notes (Signed)
[place] Progress Note Patient Name: Martha Mora DOB: 1946/04/27 MRN: [id number]   Date of Service  11/23/2019  HPI/Events of Note  Request to renew restraint orders.   eICU Interventions  Will renew bilateral wrist and ankle restraints X 8 hours.      Intervention Category Major Interventions: Delirium, psychosis, severe agitation - evaluation and management  Hartman Minahan Eugene 11/23/2019, 11:39 PM

## 2019-11-24 ENCOUNTER — Telehealth: Payer: Medicare Other | Admitting: Family Medicine

## 2019-11-24 DIAGNOSIS — G9340 Encephalopathy, unspecified: Secondary | ICD-10-CM

## 2019-11-24 DIAGNOSIS — I959 Hypotension, unspecified: Secondary | ICD-10-CM

## 2019-11-24 LAB — CBC
HCT: 37 % (ref 36.0–46.0)
Hemoglobin: 10.8 g/dL — ABNORMAL LOW (ref 12.0–15.0)
MCH: 27.1 pg (ref 26.0–34.0)
MCHC: 29.2 g/dL — ABNORMAL LOW (ref 30.0–36.0)
MCV: 93 fL (ref 80.0–100.0)
Platelets: 270 10*3/uL (ref 150–400)
RBC: 3.98 MIL/uL (ref 3.87–5.11)
RDW: 15.5 % (ref 11.5–15.5)
WBC: 22 10*3/uL — ABNORMAL HIGH (ref 4.0–10.5)
nRBC: 0 % (ref 0.0–0.2)

## 2019-11-24 LAB — BASIC METABOLIC PANEL
Anion gap: 10 (ref 5–15)
BUN: 24 mg/dL — ABNORMAL HIGH (ref 8–23)
CO2: 21 mmol/L — ABNORMAL LOW (ref 22–32)
Calcium: 7.4 mg/dL — ABNORMAL LOW (ref 8.9–10.3)
Chloride: 111 mmol/L (ref 98–111)
Creatinine, Ser: 0.8 mg/dL (ref 0.44–1.00)
GFR calc Af Amer: >60 mL/min (ref >60)
GFR calc non Af Amer: >60 mL/min (ref >60)
Glucose, Bld: 147 mg/dL — ABNORMAL HIGH (ref 70–99)
Potassium: 4.6 mmol/L (ref 3.5–5.1)
Sodium: 142 mmol/L (ref 135–145)

## 2019-11-24 LAB — GLUCOSE, CAPILLARY
Glucose-Capillary: 108 mg/dL — ABNORMAL HIGH (ref 70–99)
Glucose-Capillary: 121 mg/dL — ABNORMAL HIGH (ref 70–99)
Glucose-Capillary: 142 mg/dL — ABNORMAL HIGH (ref 70–99)
Glucose-Capillary: 149 mg/dL — ABNORMAL HIGH (ref 70–99)
Glucose-Capillary: 153 mg/dL — ABNORMAL HIGH (ref 70–99)
Glucose-Capillary: 94 mg/dL (ref 70–99)
Glucose-Capillary: 95 mg/dL (ref 70–99)

## 2019-11-24 LAB — PHOSPHORUS
Phosphorus: 3.4 mg/dL (ref 2.5–4.6)
Phosphorus: 2.9 mg/dL (ref 2.5–4.6)

## 2019-11-24 LAB — LEGIONELLA PNEUMOPHILA SEROGP 1 UR AG: L. pneumophila Serogp 1 Ur Ag: NEGATIVE

## 2019-11-24 LAB — URINE CULTURE: Culture: NO GROWTH

## 2019-11-24 LAB — MAGNESIUM
Magnesium: 2.1 mg/dL (ref 1.7–2.4)
Magnesium: 2.2 mg/dL (ref 1.7–2.4)

## 2019-11-24 LAB — APTT: aPTT: 24 seconds (ref 24–36)

## 2019-11-24 MED ORDER — CITALOPRAM HYDROBROMIDE 40 MG PO TABS: 40.0000 mg | ORAL_TABLET | Freq: Every day | ORAL | Status: DC

## 2019-11-24 MED ORDER — LORAZEPAM 0.5 MG PO TABS
0.7500 mg | ORAL_TABLET | Freq: Once | ORAL | Status: AC
  Administered 2019-11-24: 0.75 mg via ORAL
  Filled 2019-11-24: qty 2

## 2019-11-24 MED ORDER — AMIODARONE HCL 200 MG PO TABS
200.0000 mg | ORAL_TABLET | Freq: Every day | ORAL | Status: DC
  Administered 2019-11-25 – 2019-11-28 (×4): 200 mg via ORAL
  Filled 2019-11-24 (×4): qty 1

## 2019-11-24 MED ORDER — METHYLPREDNISOLONE SODIUM SUCC 125 MG IJ SOLR: 40.0000 mg | Freq: Four times a day (QID) | INTRAMUSCULAR | Status: DC

## 2019-11-24 MED ORDER — WHITE PETROLATUM EX OINT
TOPICAL_OINTMENT | CUTANEOUS | Status: AC
  Filled 2019-11-24: qty 28.35

## 2019-11-24 MED ORDER — MIDAZOLAM HCL 2 MG/2ML IJ SOLN
1.0000 mg | Freq: Once | INTRAMUSCULAR | Status: AC
  Administered 2019-11-24: 1 mg via INTRAVENOUS

## 2019-11-24 MED ORDER — RACEPINEPHRINE HCL 2.25 % IN NEBU
INHALATION_SOLUTION | RESPIRATORY_TRACT | Status: AC
  Administered 2019-11-24: 0.5 mL via RESPIRATORY_TRACT
  Filled 2019-11-24: qty 0.5

## 2019-11-24 MED ORDER — RACEPINEPHRINE HCL 2.25 % IN NEBU: 0.5000 mL | INHALATION_SOLUTION | Freq: Once | RESPIRATORY_TRACT | Status: AC

## 2019-11-24 MED ORDER — ROSUVASTATIN CALCIUM 20 MG PO TABS
20.0000 mg | ORAL_TABLET | Freq: Every day | ORAL | Status: DC
  Administered 2019-11-24 – 2019-11-27 (×3): 20 mg via ORAL
  Filled 2019-11-24 (×3): qty 1

## 2019-11-24 MED ORDER — IPRATROPIUM-ALBUTEROL 0.5-2.5 (3) MG/3ML IN SOLN
3.0000 mL | RESPIRATORY_TRACT | Status: DC
  Administered 2019-11-24 (×5): 3 mL via RESPIRATORY_TRACT
  Filled 2019-11-24 (×5): qty 3

## 2019-11-24 MED ORDER — ORAL CARE MOUTH RINSE
15.0000 mL | Freq: Two times a day (BID) | OROMUCOSAL | Status: DC
  Administered 2019-11-24 – 2019-11-28 (×6): 15 mL via OROMUCOSAL

## 2019-11-24 MED ORDER — LORAZEPAM 0.5 MG PO TABS
0.2500 mg | ORAL_TABLET | Freq: Once | ORAL | Status: AC
  Administered 2019-11-24: 0.25 mg via ORAL
  Filled 2019-11-24: qty 0.5
  Filled 2019-11-24: qty 1

## 2019-11-24 MED ORDER — DOCUSATE SODIUM 100 MG PO CAPS: 100.0000 mg | ORAL_CAPSULE | Freq: Two times a day (BID) | ORAL | Status: DC | PRN

## 2019-11-24 MED ORDER — PANTOPRAZOLE SODIUM 40 MG PO TBEC
40.0000 mg | DELAYED_RELEASE_TABLET | Freq: Every day | ORAL | Status: DC
  Administered 2019-11-24 – 2019-11-27 (×4): 40 mg via ORAL
  Filled 2019-11-24 (×4): qty 1

## 2019-11-24 MED ORDER — ACETAMINOPHEN 325 MG PO TABS
650.0000 mg | ORAL_TABLET | Freq: Four times a day (QID) | ORAL | Status: DC | PRN
  Administered 2019-11-24 – 2019-11-26 (×6): 650 mg via ORAL
  Filled 2019-11-24 (×6): qty 2

## 2019-11-24 MED ORDER — LORAZEPAM 1 MG PO TABS
1.0000 mg | ORAL_TABLET | Freq: Four times a day (QID) | ORAL | Status: DC | PRN
  Administered 2019-11-24 – 2019-11-25 (×2): 1 mg via ORAL
  Filled 2019-11-24 (×3): qty 1

## 2019-11-24 MED ORDER — APIXABAN 5 MG PO TABS
5.0000 mg | ORAL_TABLET | Freq: Two times a day (BID) | ORAL | Status: DC
  Administered 2019-11-24 – 2019-11-28 (×9): 5 mg via ORAL
  Filled 2019-11-24 (×9): qty 1

## 2019-11-24 MED ORDER — APIXABAN 5 MG PO TABS: 5.0000 mg | ORAL_TABLET | Freq: Two times a day (BID) | ORAL | Status: DC

## 2019-11-24 MED ORDER — MIDAZOLAM HCL 2 MG/2ML IJ SOLN
INTRAMUSCULAR | Status: AC
  Filled 2019-11-24: qty 2

## 2019-11-24 MED ORDER — METHYLPREDNISOLONE SODIUM SUCC 125 MG IJ SOLR
40.0000 mg | Freq: Four times a day (QID) | INTRAMUSCULAR | Status: AC
  Administered 2019-11-24 (×3): 40 mg via INTRAVENOUS
  Filled 2019-11-24 (×3): qty 2

## 2019-11-24 NOTE — Discharge Summary (Addendum)
[facility] Discharge Summary  Patient name: Martha Mora Medical record number: [id number] Date of birth: 02/21/1946 Age: 74 y.o. Gender: female Date of Admission: 11/22/2019  Date of Discharge: 11/28/19  Admitting Physician: Kandice Hams, MD  Primary Care Provider: [name], DO Consultants: CCM  Indication for Hospitalization: acute respiratory failure due to pneumonia  Discharge Diagnoses/Problem List:  Tobacco use  COPD OSA Atrial fibrillation CAD HTN MVR GERD Esophageal stricture IBS GAD Mixed urinary incontinence Restless leg syndrome  Disposition: Home   Discharge Condition: Improving and stable    Discharge Exam:   GEN: pleasant elderly female in no acute distress  CV: regular rate and rhythm, no murmurs appreciated  RESP: no increased work of breathing, clear to ascultation bilaterally with no crackles, wheezes, or rhonchi  ABD: Bowel sounds present. Soft, Nontender, Nondistended.  MSK: 1+ bilateral lower extremity edema, or cyanosis noted SKIN: warm, dry, no cyanosis  NEURO: grossly normal, moves all extremities appropriately PSYCH: Normal affect and thought content   Brief Hospital Course:  Acute hypoxic respiratory failure due to CAP Ms. Cloer was admitted on 1/5 due to acute hypoxic respiratory failure and delirium.  She required up to 15 L via nonrebreather in the emergency department and required doses of Haldol and Ativan as well as soft restraints to control her delirium and anxiety so that she would tolerate oxygen.  She became somnolent and was unable to protect her airway, so she was transferred to the ICU and intubated.  She also developed bradycardia and hypotension possibly due to sepsis as well as the administration of propofol, and she required Levophed for pressure support.  She was found to be Covid negative as well as influenza A/B negative, but RVP was positive for rhinovirus.  She was treated  with ceftriaxone and azithromycin for possible overlying bacterial infection.  She was also treated with duonebs and methylprednisolone.  Respiratory, urine, and blood cultures were collected as well as strep pneumonia urine antigen and Legionella urine antigen.  Urine culture showed no growth while strep pneumoniae urine antigen and Legionella urine antigens were negative blood cultures were negative and respiratory culture showed normal respiratory flora.  On 1/6, azithromycin was discontinued.  Patient self extubated and tolerated 8 to 10 L of oxygen via nasal cannula.  She was transferred back to [facility] on 1/7.   Patient was treated for CAP with ceftriaxone (1/5-1/8) and transitioned to doxycycline and cefdinir and should complete 7-day antibiotic therapy on 11/29/2019.  She requires oxygen with ambulation and had home oxygen ordered prior to discharge.  She saturates well on room air at rest.   Acute metabolic encephalopathy  GAD  Patient required the administration of Versed, Ativan, and Haldol and eventually required Precedex once transferred to the ICU.  Haldol was later held due to prolonged QT interval.  She was transitioned to Ativan 1 mg every 6 hours as needed prior to transfer back to FPTS.  Her home medications of Lyrica, meclizine, Celexa, and trazodone were held.  Celexa was discontinued due to concerns of QT prolongation and the patient was transitioned to Cymbalta instead.  Additionally, patient responded well to BuSpar.  Low-dose (0.5 mg) Ativan prn was available as needed at bedtime.  Patient was discharged with Cymbalta 40 mg daily and BuSpar 7.5 mg 3 times daily.   Hypertension Blood pressures were elevated on patient's home regimen. SBP 220 / DBP 100. Increased amlodipine to 10 mg , added metoprolol 12.5mg  BID  and continued  100 mg Cozaar.  Patient was discharged with this regimen.   Issues for Follow Up:  Patient will likely benefit from outpatient pulmonary function testing and/or  pulmonary referral.  She has a new oxygen requirement with ambulation.  Recommend rechecking blood pressure at follow-up visit and titrating hypertensive medications appropriately.  Meclizine, Lyrica, Robaxin and trazodone were discontinued.  Recommend restarting these medications as needed.  Patient very anxious at baseline. Please titrate anxiety medication as necessary. Consider referral to therapy.  Patient with one episode of hemoptysis on day prior to discharge. Likely due to combination of nasal irritation from recent Rhinovirus infection combined with baseline anticoagulation. Hemoglobin stable on discharge. Please reassess in the clinic as necessary.    Significant Procedures: None   Significant Labs and Imaging:  Recent Labs  Lab 11/26/19 0432 11/27/19 0150 11/28/19 0224  WBC 17.7* 14.5* 15.6*  HGB 11.0* 10.9* 11.1*  HCT 37.0 36.1 36.7  PLT 383 345 358   Recent Labs  Lab 11/22/19 2333 11/23/19 1523 11/23/19 2058 11/24/19 0449 11/24/19 1619 11/25/19 0217 11/26/19 0432 11/27/19 0150 11/28/19 0224  NA   < >  --   --  142  --  139 137 138 137  K   < >  --   --  4.6  --  4.0 3.8 3.8 3.6  CL  --   --   --  111  --  105 103 103 102  CO2  --   --   --  21*  --  24 23 25 25   GLUCOSE  --   --   --  147*  --  146* 110* 106* 122*  BUN  --   --   --  24*  --  24* 22 16 12   CREATININE  --   --   --  0.80  --  0.89 0.77 0.72 0.73  CALCIUM  --   --   --  7.4*  --  8.1* 8.2* 8.5* 8.6*  MG  --  2.2 2.0 2.1 2.2  --   --   --   --   PHOS  --  3.0 3.1 3.4 2.9  --   --   --   --    < > = values in this interval not displayed.      Results/Tests Pending at Time of Discharge:   Discharge Medications:  Allergies as of 11/28/2019       Reactions   Keflex [cephalexin] Other (See Comments)   "Made me feel funny" Tolerated Rocephin in April 2020 over several days and 07/01/19 in ED.   Ropinirole Hcl Other (See Comments)   "Could not stop moving" likely akathisia occurred in 2019    Scallops [shellfish Allergy] Nausea And Vomiting   Penicillins Rash, Other (See Comments)   03/01/19 tolerated Zosyn Red bumps all over stomach Did it involve swelling of the face/tongue/throat, SOB, or low BP? No Did it involve sudden or severe rash/hives, skin peeling, or any reaction on the inside of your mouth or nose? Yes Did you need to seek medical attention at a hospital or doctor's office? Yes When did it last happen?      30+ years  If all above answers are "NO", may proceed with cephalosporin use.        Medication List     STOP taking these medications    citalopram 40 MG tablet Commonly known as: CELEXA   meclizine 25 MG tablet Commonly known as: [employer]  methocarbamol 500 MG tablet Commonly known as: ROBAXIN   pregabalin 150 MG capsule Commonly known as: LYRICA   traZODone 50 MG tablet Commonly known as: DESYREL       TAKE these medications    acetaminophen 500 MG tablet Commonly known as: TYLENOL Take 1,500 mg by mouth 2 (two) times daily. Notes to patient: This evening    albuterol 108 (90 Base) MCG/ACT inhaler Commonly known as: Ventolin HFA 1-2 inhalations every 4-6 hours as needed for wheezing. Dispense spacer as needed. What changed:  how much to take how to take this when to take this reasons to take this additional instructions   amiodarone 200 MG tablet Commonly known as: PACERONE TAKE 1 TABLET BY MOUTH EVERY DAY What changed: when to take this Notes to patient: 11/29/2019   amLODipine 5 MG tablet Commonly known as: NORVASC Take 2 tablets (10 mg total) by mouth daily. What changed: how much to take Notes to patient: 11/29/2019   busPIRone 7.5 MG tablet Commonly known as: BUSPAR Take 1 tablet (7.5 mg total) by mouth 3 (three) times daily. Notes to patient: 11/28/2019   Calcium 600+D 600-800 MG-UNIT Tabs Generic drug: Calcium Carb-Cholecalciferol Take 1 tablet by mouth 2 (two) times a day. Notes to patient: 11/28/2019    cefdinir 300 MG capsule Commonly known as: OMNICEF Take 1 capsule (300 mg total) by mouth every 12 (twelve) hours for 2 days. Notes to patient: 12/08/2019   doxycycline 100 MG tablet Commonly known as: VIBRA-TABS Take 1 tablet (100 mg total) by mouth every 12 (twelve) hours for 2 days. Notes to patient: 11/28/2019   DULoxetine HCl 40 MG Cpep Take 40 mg by mouth daily. Start taking on: November 29, 2019 Notes to patient: 11/29/2019   Eliquis 5 MG Tabs tablet Generic drug: apixaban TAKE 1 TABLET BY MOUTH TWICE A DAY What changed: how much to take Notes to patient: 11/28/2019   famotidine 20 MG tablet Commonly known as: PEPCID Take 1 tablet (20 mg total) by mouth 2 (two) times daily. Notes to patient: 11/28/2019   losartan 100 MG tablet Commonly known as: COZAAR Take 100 mg by mouth daily. Notes to patient: 11/29/2019   Melatonin 3 MG Tabs Take 1 tablet (3 mg total) by mouth at bedtime as needed (For sleep).   metoprolol tartrate 25 MG tablet Commonly known as: LOPRESSOR Take 0.5 tablets (12.5 mg total) by mouth 2 (two) times daily.   multivitamin tablet Take 1 tablet by mouth daily. Herbal Life multivitamin Notes to patient: 11/29/2019   omeprazole 40 MG capsule Commonly known as: PRILOSEC Take 40 mg by mouth daily. Notes to patient: 11/29/2019   ondansetron 4 MG disintegrating tablet Commonly known as: Zofran ODT Take 1 tablet (4 mg total) by mouth every 6 (six) hours as needed for nausea or vomiting. What changed: when to take this   PRESERVISION AREDS 2 PO Take 1 tablet by mouth daily.   Prolia 60 MG/ML Sosy injection Generic drug: denosumab Inject 60 mg into the skin every 6 (six) months.   rosuvastatin 20 MG tablet Commonly known as: CRESTOR Take 20 mg by mouth daily. Notes to patient: 11/28/2019   zinc gluconate 50 MG tablet Take 50 mg by mouth daily. Notes to patient: 11/28/2019               Durable Medical Equipment  (From admission, onward)            Start     Ordered   11/27/19 1441  For home use only DME oxygen  Once    Question Answer Comment  Length of Need 6 Months   Mode or (Route) Nasal cannula   Liters per Minute 2   Frequency Continuous (stationary and portable oxygen unit needed)   Oxygen conserving device Yes   Oxygen delivery system Gas      11/27/19 1441            Discharge Instructions: Please refer to Patient Instructions section of EMR for full details.  Patient was counseled important signs and symptoms that should prompt return to medical care, changes in medications, dietary instructions, activity restrictions, and follow up appointments.   Follow-Up Appointments: Follow-up Information     [name], DO. Go on 12/02/2019.   Specialty: Family Medicine Contact information: [id number] [address] [phone]         Care, [facility] Follow up.   Specialty: [place] Why: they will call you in the next 1-2 days to set up yor first home appointment Contact information: [address] [place] [address] [phone]           [name], DO PGY-1, [place] [facility] 11/28/2019 1:54 PM    Marny Lowenstein, MD, [place] - PGY3 11/28/2019 2:19 PM

## 2019-11-24 NOTE — Progress Notes (Signed)
[facility] Progress Note Patient Name: Martha Mora DOB: 19-Feb-1946 MRN: [id number]   Date of Service  11/24/2019  HPI/Events of Note  Agitation - Banging on bed rails and trying to get out of bed.  BP = 118/103  eICU Interventions  Will order: 1. Versed 1 mg IV X 1 now.  2. Increase ceiling on Precedex IV infusion to 1.6 mcg/kg/hour.  3. Versed 1 mg IV now.      Intervention Category Major Interventions: Delirium, psychosis, severe agitation - evaluation and management  Catalina Salasar Eugene 11/24/2019, 2:19 AM

## 2019-11-24 NOTE — Progress Notes (Signed)
eLink Physician-Brief Progress Note Patient Name: Martha Mora DOB: January 27, 1946 MRN: [id number]   Date of Service  11/24/2019  HPI/Events of Note  Self extubated. Looks comfortable on 100% NRBM. Will give a trial of extubation. Sat = 100% and RR16.  eICU Interventions  Will order: 1. Stop all sedation.  2. Wean FiO2 as tolerated.  3. Incentive spirometry Q 1 hour while awake.  4. ABG at 7:15 AM.     Intervention Category Major Interventions: Respiratory failure - evaluation and management  Martha Mora 11/24/2019, 5:30 AM

## 2019-11-24 NOTE — Progress Notes (Signed)
[facility] Progress Note Patient Name: Martha Mora DOB: [birth year] MRN: [id number]   Date of Service  11/24/2019  HPI/Events of Note  Stridor and wheezing post self extubation.   eICU Interventions  Will order: 1. Racemic Epi 0.5 mL via neb now. 2. Solumedrol 40 mg IV now and Q 6 hours X 4 doses.      Intervention Category Major Interventions: Other:  Lysle Dingwall 11/24/2019, 6:37 AM

## 2019-11-24 NOTE — Progress Notes (Signed)
0220 Pt very restless, moving around and attempting to get out of bed. Pt in restraints. Given fentanyl multiple times with no effects. Pt maxed out on precedex infusion. Notified MD for more sedation. One time dose of versed given and increased ceiling on precedex infusion.  0530 Pt self-extubated. Restraints still intact. Pt immediately placed on NRB and Dr.Sommers notified. Sedation stopped. Pt alert and able to answer orientation questions. Pt able to use incentive spirometer. Vitals stable at this time.

## 2019-11-24 NOTE — Progress Notes (Signed)
ANTICOAGULATION CONSULT NOTE  Transition from heparin to Eliquis  Labs: Recent Labs    11/22/19 2333 11/23/19 0204 11/23/19 0249 11/23/19 0514 11/23/19 0800 11/23/19 1523 11/24/19 0449 11/24/19 0805  HGB 11.1* 11.6*  --  9.9*  --   --  10.8*  --   HCT 38.2 34.0*  --  29.0*  --   --  37.0  --   PLT 261  --   --   --   --   --  270  --   APTT  --   --   --   --  37*  --   --  24  LABPROT  --   --   --   --  14.5  --   --   --   INR  --   --   --   --  1.1  --   --   --   HEPARINUNFRC  --   --   --   --  0.94*  --   --   --   CREATININE 0.79  --   --   --   --   --  0.80  --   TROPONINIHS  --   --  9  --   --  43*  --   --    Assessment: On Eliquis PTA for AFib. Her last dose was 11/22/19 in the morning. Started on heparin on 1/5 while delirious and intubated, but pt is currently self-extubated. Scr stable at 0.80, H/H Plts WNL.   Plan:  -Transition from heparin dosing back to home Eliquis dose of 5mg  BID.  Erick Blinks, PharmD Candidate [place] [facility]

## 2019-11-24 NOTE — Progress Notes (Signed)
FPTS Social Progress Note   Following along socially, appreciate care by CCM and will resume care when patient stable for transfer out to floor.   [name], DO PGY-1, [facility] 11/24/2019 10:15 AM    - Please contact intern pager [phone] as needed

## 2019-11-24 NOTE — Progress Notes (Addendum)
NAME:  Martha Mora, MRN:  [id number], DOB:  [birth year], LOS: 1 ADMISSION DATE:  11/22/2019, CONSULTATION DATE:  11/22/2018 REFERRING MD:  Dr. Ardelia Mems, FPTS CHIEF COMPLAINT:  Resp failure  Brief History   74 yo female presented with dyspnea, wheeze, chest tightness.  Found to have acute hypoxic respiratory failure from pneumonia and positive for Rhinovirus.  Past Medical History  Tobacco abuse (vapes), OSA, Afib on eliquis, CAD, HTN, MVR, GERD, esophageal stricture, diverticulosis, IBS, OA, RLS/ chronic pain, mixed urinary incontinence.  Significant Hospital Events   1/04 Admitted [facility] 1/05 intubated  Consults:  N/A  Procedures:  1/5 ETT >>01/06 (Self extubated)  Significant Diagnostic Tests:  1/5 CTA PE >>  Micro Data:  SARS CoV2 PCR 1/05 >> negative Influenza PCR 1/05 >> A and B negative Blood 1/05 >> RVP 1/05 >> Rhinovirus Sputum 1/05 >> Pneumococcal Ag 1/05 >> Negative Legionella Ag 1/05 >>  Antimicrobials:  1/5 azithromycin >> 1/5 ceftriaxone >>  Interim history/subjective:  Patient stated that she has been very anxious for the past 2-3 weeks. It is worse now with her acute respiratory illness. Patients vitals stable at rest today despite anxiety. She continues to be concerned with shortness of breath unless sitting up. She is able to speak in full sentences during the exam and responds well to reassurance.   Objective   Blood pressure (!) 122/49, pulse 73, temperature 99.1 F (37.3 C), resp. rate 14, height 5\' 1"  (1.549 m), weight 95.3 kg, SpO2 92 %.    Vent Mode: PRVC FiO2 (%):  [45 %-100 %] 100 % Set Rate:  [20 bmp] 20 bmp Vt Set:  [380 mL] 380 mL PEEP:  [5 cmH20] 5 cmH20 Plateau Pressure:  [13 cmH20] 13 cmH20   Intake/Output Summary (Last 24 hours) at 11/24/2019 0751 Last data filed at 11/24/2019 0600 Gross per 24 hour  Intake 1717.65 ml  Output 800 ml  Net 917.65 ml   Filed Weights   11/22/19 2305  Weight: 95.3 kg   Physical  examination: General: A/O x4, anxious, afebrile, nondiaphoretic HEENT: PEERL, EMO intact Cardio: RRR, no mrg's  Pulmonary: Marked bilateral biphasic wheezing  Abdomen: Bowel sounds normal, soft, nontender  MSK: BLE nontender, nonedematous Neuro: Alert, conversational Psych: Appropriate affect, not depressed in appearance, engages well  Resolved Hospital Problem list     Assessment & Plan:   Acute hypoxic respiratory failure from Rhinovirus viral pneumonia with possible bacterial superinfection. Slightly symptomatically hypoxic on presentation with lethargy and confusion noted in the setting of a PaO2 of 72 that worsened to 60. She decompensated further and required intubation. Patient self extubated this am. She has remained hemodynamically stable since that time on 8-10L supplemental oxygen. Vitals are stable - Continue supplemental O2 support - Continue ABx given severity of illness and CXR - check legionella, pneumococcal urine Ag - f/u sputum culture  GAD: Panic disorder: Worse with respiratory distress in the past 2-3 weeks with episodes similar to panic attacks. Patient appears very anxious on exam today, minimally agitated. Vitals are WNL's however.  -We will restart her SSRI and attempt very low dose ativan. Will increase ativan to 0.5-1 mg BID PRN for anxiety during acute illness but patient will need long term outpatient assistance with this most likely.   Sepsis from likely viral pneumonia. Patient initially presented with bradycardia and tachypnea. WBC count of 22. Pressors ordered to keep MAP > 65 but not. Norepi discontinued when BP normalized. Now off pressors.  -Discontinue pressors -Monitor vitals  Acute metabolic encephalopathy from hypoxia and sepsis. Hx of chronic pain, RLS, anxiety. Resolved. Prior admission in April of 2020 requiring intubation for respiratory failure from sepsis with respiratory reserve decline since. She has a notable tobacco use history  with cessation of cigarette use in 2018 but continues to Vape since that time.   - RASS goal -1 to -2 - Hold outpt citalopram, lyrica, trazodone, meclizine  Hx of CAD, Permanent A fib, HTN, HLD. Echocardiogram demonstrating hyperdynamic response with LVEF of 65-70%.  - continue amiodarone, crestor - hold outpt eliquis on heparin now, losartan for now - heparin gtt per pharmacy  Best practice:  Diet: tube feeds DVT prophylaxis: heparin per pharmacy  GI prophylaxis: protonix Mobility: bed rest Code Status: Full  Disposition: ICU   Labs:   CMP Latest Ref Rng & Units 11/24/2019 11/23/2019 11/23/2019  Glucose 70 - 99 mg/dL 147(H) - -  BUN 8 - 23 mg/dL 24(H) - -  Creatinine 0.44 - 1.00 mg/dL 0.80 - -  Sodium 135 - 145 mmol/L 142 141 140  Potassium 3.5 - 5.1 mmol/L 4.6 3.6 3.7  Chloride 98 - 111 mmol/L 111 - -  CO2 22 - 32 mmol/L 21(L) - -  Calcium 8.9 - 10.3 mg/dL 7.4(L) - -  Total Protein 6.5 - 8.1 g/dL - - -  Total Bilirubin 0.3 - 1.2 mg/dL - - -  Alkaline Phos 38 - 126 U/L - - -  AST 15 - 41 U/L - - -  ALT 0 - 44 U/L - - -    CBC Latest Ref Rng & Units 11/24/2019 11/23/2019 11/23/2019  WBC 4.0 - 10.5 K/uL 22.0(H) - -  Hemoglobin 12.0 - 15.0 g/dL 10.8(L) 9.9(L) 11.6(L)  Hematocrit 36.0 - 46.0 % 37.0 29.0(L) 34.0(L)  Platelets 150 - 400 K/uL 270 - -    ABG    Component Value Date/Time   PHART 7.320 (L) 11/23/2019 0514   PCO2ART 42.9 11/23/2019 0514   PO2ART 60.0 (L) 11/23/2019 0514   HCO3 22.1 11/23/2019 0514   TCO2 23 11/23/2019 0514   ACIDBASEDEF 4.0 (H) 11/23/2019 0514   O2SAT 88.0 11/23/2019 0514    CBG (last 3)  Recent Labs    11/23/19 2009 11/24/19 0032 11/24/19 [place]   Kathi Ludwig, MD [facility], PGY-3 Pager # [phone]  Please see Attending A/P and/or Addendum for final recommendations.  Attending Note:  74 year old female presenting to PCCM with respiratory failure due to rhinovirus.  Overnight, no  events, extubated this AM.  On exam, alert and interactive, moving all ext to command with coarse BS.  I reviewed CXR myself, mild infiltrate noted and ETT is in a good position.  Discussed with PCCM-NP.  Will titrate O2 for sat of 88-92%.  Start eliquis and d/c heparin.  D/C celexa and zithromax given QTc.  Continue rocephin.  If tolerates extubation well will consider transferring and out of the ICU and to [facility] service.  Advance diet to heart healthy.  PT/OT.  OOB to chair.    The patient is critically ill with multiple organ systems failure and requires high complexity decision making for assessment and support, frequent evaluation and titration of therapies, application of advanced monitoring technologies and extensive interpretation of multiple databases.   Critical Care Time devoted to patient care services described in this note is  32  Minutes. This time reflects time of care of this signee Dr Jennet Maduro. This critical care time  does not reflect procedure time, or teaching time or supervisory time of PA/NP/Med student/Med Resident etc but could involve care discussion time.  Rush Farmer, M.D. [facility] Pulmonary/Critical Care Medicine.

## 2019-11-24 NOTE — Progress Notes (Signed)
RT called to PT room for self extubation. Upon arrival sats maintaining on [facility] , patient can verbally give name and gas strong cough on demand, spoke with Dr Emmit Alexanders, he agrees to leave her on [facility] and escalate to Bipap if necessary. Rt will continue to monitor, check ABG, and titrate O2 as tolerated.

## 2019-11-24 NOTE — Progress Notes (Addendum)
ANTICOAGULATION CONSULT NOTE - Initial Consult  Pharmacy Consult for heparin Indication: atrial fibrillation   Patient Measurements: Height: 5\' 1"  (154.9 cm) Weight: 210 lb (95.3 kg) IBW/kg (Calculated) : 47.8 Heparin Dosing Weight: 70.4 kg  Vital Signs: Temp: 99.1 F (37.3 C) (01/06 0600) Temp Source: Core (Comment) (01/06 0400) BP: 122/49 (01/06 0500) Pulse Rate: 73 (01/06 0600)  Labs: Recent Labs    11/22/19 2333 11/23/19 0204 11/23/19 0249 11/23/19 0514 11/23/19 0800 11/23/19 1523 11/24/19 0449 11/24/19 0805  HGB 11.1* 11.6*  --  9.9*  --   --  10.8*  --   HCT 38.2 34.0*  --  29.0*  --   --  37.0  --   PLT 261  --   --   --   --   --  270  --   APTT  --   --   --   --  37*  --   --  24  LABPROT  --   --   --   --  14.5  --   --   --   INR  --   --   --   --  1.1  --   --   --   HEPARINUNFRC  --   --   --   --  0.94*  --   --   --   CREATININE 0.79  --   --   --   --   --  0.80  --   TROPONINIHS  --   --  9  --   --  43*  --   --      Assessment: On Eliquis PTA for AFib. Her last dose was 11/22/19 in the morning.  Started on heparin on 1/5 while delirious and intubated with heparin bolus 3,000 units then to 1,000 units/hour. Pt currently self-extubated. Scr stable at 0.80, H/H Plts WNL.  Goal of Therapy:  aPTT 66-102 seconds Monitor platelets by anticoagulation protocol: Yes   Plan:  -Increase heparin infusion 30% to 1,300 units/hour. -Daily HL, CBC, aPTT -Check aPTT level tonight at [place], PharmD Candidate [place]   I discussed / reviewed the pharmacy note by Erick Blinks, Pharm D Candidate and I agree with the resident's findings and plans as documented.  Sloan Leiter, PharmD, BCPS, BCCCP Clinical Pharmacist Please refer to [facility] for [facility] Pharmacy numbers

## 2019-11-25 ENCOUNTER — Inpatient Hospital Stay (HOSPITAL_COMMUNITY): Payer: Medicare Other

## 2019-11-25 ENCOUNTER — Encounter (INDEPENDENT_AMBULATORY_CARE_PROVIDER_SITE_OTHER): Payer: Medicare Other | Admitting: Ophthalmology

## 2019-11-25 ENCOUNTER — Telehealth: Payer: Self-pay | Admitting: Family Medicine

## 2019-11-25 LAB — CBC
HCT: 35.1 % — ABNORMAL LOW (ref 36.0–46.0)
Hemoglobin: 10.2 g/dL — ABNORMAL LOW (ref 12.0–15.0)
MCH: 26.9 pg (ref 26.0–34.0)
MCHC: 29.1 g/dL — ABNORMAL LOW (ref 30.0–36.0)
MCV: 92.6 fL (ref 80.0–100.0)
Platelets: 302 10*3/uL (ref 150–400)
RBC: 3.79 MIL/uL — ABNORMAL LOW (ref 3.87–5.11)
RDW: 15.6 % — ABNORMAL HIGH (ref 11.5–15.5)
WBC: 20 10*3/uL — ABNORMAL HIGH (ref 4.0–10.5)
nRBC: 0 % (ref 0.0–0.2)

## 2019-11-25 LAB — BASIC METABOLIC PANEL
Anion gap: 10 (ref 5–15)
BUN: 24 mg/dL — ABNORMAL HIGH (ref 8–23)
CO2: 24 mmol/L (ref 22–32)
Calcium: 8.1 mg/dL — ABNORMAL LOW (ref 8.9–10.3)
Chloride: 105 mmol/L (ref 98–111)
Creatinine, Ser: 0.89 mg/dL (ref 0.44–1.00)
GFR calc Af Amer: >60 mL/min (ref >60)
GFR calc non Af Amer: >60 mL/min (ref >60)
Glucose, Bld: 146 mg/dL — ABNORMAL HIGH (ref 70–99)
Potassium: 4 mmol/L (ref 3.5–5.1)
Sodium: 139 mmol/L (ref 135–145)

## 2019-11-25 LAB — GLUCOSE, CAPILLARY
Glucose-Capillary: 115 mg/dL — ABNORMAL HIGH (ref 70–99)
Glucose-Capillary: 210 mg/dL — ABNORMAL HIGH (ref 70–99)
Glucose-Capillary: 98 mg/dL (ref 70–99)
Glucose-Capillary: 108 mg/dL — ABNORMAL HIGH (ref 70–99)
Glucose-Capillary: 103 mg/dL — ABNORMAL HIGH (ref 70–99)

## 2019-11-25 MED ORDER — IPRATROPIUM-ALBUTEROL 0.5-2.5 (3) MG/3ML IN SOLN: 3.0000 mL | Freq: Four times a day (QID) | RESPIRATORY_TRACT | Status: DC | PRN

## 2019-11-25 MED ORDER — DOXYCYCLINE HYCLATE 100 MG PO TABS
100.0000 mg | ORAL_TABLET | Freq: Two times a day (BID) | ORAL | Status: DC
  Administered 2019-11-25 – 2019-11-28 (×7): 100 mg via ORAL
  Filled 2019-11-25 (×7): qty 1

## 2019-11-25 MED ORDER — LORAZEPAM 0.5 MG PO TABS
0.2500 mg | ORAL_TABLET | Freq: Two times a day (BID) | ORAL | Status: DC | PRN
  Administered 2019-11-25: 0.25 mg via ORAL
  Filled 2019-11-25: qty 1

## 2019-11-25 MED ORDER — LOSARTAN POTASSIUM 50 MG PO TABS
50.0000 mg | ORAL_TABLET | Freq: Every day | ORAL | Status: DC
  Administered 2019-11-25: 50 mg via ORAL
  Filled 2019-11-25: qty 1

## 2019-11-25 MED ORDER — NICOTINE 21 MG/24HR TD PT24
21.0000 mg | MEDICATED_PATCH | Freq: Every day | TRANSDERMAL | Status: DC
  Administered 2019-11-25 – 2019-11-28 (×4): 21 mg via TRANSDERMAL
  Filled 2019-11-25 (×5): qty 1

## 2019-11-25 MED ORDER — MELATONIN 3 MG PO TABS
3.0000 mg | ORAL_TABLET | Freq: Every evening | ORAL | Status: DC | PRN
  Administered 2019-11-25 – 2019-11-27 (×2): 3 mg via ORAL
  Filled 2019-11-25 (×4): qty 1

## 2019-11-25 MED ORDER — DULOXETINE HCL 20 MG PO CPEP
20.0000 mg | ORAL_CAPSULE | Freq: Every day | ORAL | Status: DC
  Administered 2019-11-25 – 2019-11-27 (×3): 20 mg via ORAL
  Filled 2019-11-25 (×3): qty 1

## 2019-11-25 MED ORDER — LORATADINE 10 MG PO TABS
10.0000 mg | ORAL_TABLET | Freq: Once | ORAL | Status: AC
  Administered 2019-11-25: 10 mg via ORAL
  Filled 2019-11-25: qty 1

## 2019-11-25 NOTE — Telephone Encounter (Signed)
Opened in error

## 2019-11-25 NOTE — Progress Notes (Addendum)
Patient notified nurse of having "the shakes." Patient alert and oriented x 4. Hands and legs noted to be trembling. Able to follow commands and raise both legs and arms although trembling is noted. Patient says "this happened here and at home." Patient says that she is feeling anxious. PRN ativan 0.25 given. Will continue to observe.   2149- Patient remains up in chair. Tremors has stopped. Patient verbalizes relief. Will continue to monitor respiratory sats. Drops to mid-lower 80s when oxygen is not on. Patient is reminded to keep her oxygen on. On HFNC at 5L. Patient will put oxygen on in front of nurse but upon assessments of dropped sats patient is noted up in chair with 02 completely off. Alert and oriented x 4. Patient is reminded that she must keep her oxygen on. Verbalizes understanding but also tells this nurse that she is a little claustrophobic. Offered to extend oxygen tubing. Patient refuses.  2338- Patient up in room to bedside commode. No distress at this time. Was given tylenol for chronic back pain. Patient stands up at bedside. Remains on HFNC at 5L. Practiced with IS, 1250. Teach back without questions.   0040- Patient back up in chair, complains of tremors. Patient's hands and legs shaking. Lasts for a few minutes but more frequently. Patient able to converse and follow commands. B/p taken and was elevated at 187/75 retaken 196/68 . Patient appears to be very anxious and restless. Patient states that she takes trazodone at home and her last dose was 5-6 days ago. Will notify family teaching intern at this time for patient's discomfort. o2 sats 97% while 02at 5L. Patient says that she removes oxygen to blow her nose because it runs with the oxygen on. Often forgets to place oxygen tube back in.   0046- Paged Posey Pronto on call for family teaching service about patient's restlessness and elevated b/p.  [phone] Pronto returned paged. New orders noted and verbalized back.   54- Family teaching  at bedside educating and updating patient on her meds. Patient verbalizes understanding and relief. Ativan 0.5mg  ordered to be given now.   44- Paged Patel about patient's discomfort and anxiety. Continues to be up and down from chair to bed. No relief from lidoderm patch or ativan that was given earlier. Awaiting call back.   [place] with Posey Pronto, Updated on patient. New orders noted.

## 2019-11-25 NOTE — Progress Notes (Signed)
desats to 80's on room air, continue with HFNC at 5L.

## 2019-11-25 NOTE — Progress Notes (Addendum)
Family Medicine Teaching Service Daily Progress Note Intern Pager: [phone]  Patient name: Martha Mora Medical record number: [id number] Date of birth: April 24, 1946 Age: 74 y.o. Gender: female  Primary Care Provider: [name], DO Consultants: CCM,  Code Status: Full   Pt Overview and Major Events to Date:  11/23/19: Admitted  11/24/19: Self Extubated  11/25/19: Returns to FMTS   Assessment and Plan: Martha Mora is a 74 y.o. female who presented with respiratory distress. PMH is significant for h/o breast cancer, anxiety, HTN, CAD, esophageal stricture, GERD, diverticulosis, IBS, OA, OSA, MVR, RLS, mixed urinary incontinence, h/o respiratory distress requiring intubation 02/2019.   Acute Hypoxic Respiratory Failure  CAP  Enterovirus  Patient admitted on 1/5 due to acute hypoxic respiratory failure and delirium.  She required up to 15 L via nonrebreather in the emergency department and required doses of Haldol and Ativan as well as soft restraints to control her delirium and anxiety so that she would tolerate oxygen.  She became somnolent and was unable to protect her airway, so she was transferred to the ICU and intubatedPatient returns to our service from the ICU today after self-extubating yesterday. Required 8-10 L [place] yesterday. Satting well on 5L [place] this morning however desatts to the mid-80s with movement. Covid and influzena A/B testing was negative. RVP +Rhinovirus. Continue CTX for CAP. As patient had prolonged QTc Azithro was discontinued. Add doxycycline. Febrile 100.79F overnight. Leukocytosis, WBC 20, elevated likely due to steroids administration.  Reports intermittent dysuria but UCx no growth. BCx no growth Day 1/5. Respiratory culture pending.  Patient reports she takes Lasix at home and has 2+/3+ LE edema on exam. Consider IV Lasix if no improvement in respiratory status. Encourage LE elevation. CXR this morning showing mild CHF. Patient with normal PFTs in Feb 2018.    - CTX 1g (1/5 - ) - s/p Azithromycin (1/5) - Doxyxycline 100 mg BID (1/7 - to end 1/10 ) - s/p Solumedrol 125mg  (1/5 - 1/6) - Monitor respiratory status  - Wean oxygen as tolerated  - Follow up BCx : no growth Day 1 / 5 - Follow up Respiratory Cx - Monitor fever curve - PT / OT eval and treat : ambulatory desaturation test prior to discharge  - Encourage LE elevation    Delirium  GAD  Panic Disorder  Patient continues to be and has been anxious for past 2-3 weeks. Patient reports anxiety and claustrophobia this morning. Home medications include Lyrica, Celexa, Trazadone  - Hold home medications  - Start Duloxetine 20 mg  - Low dose Ativan 0.25 mg BID PRN - Delirium precautions  - Discontinue monitors when able     AFIB  CAD  HLD  Home meds: Eliquis, Amiodarone, Rosuvastatin. HR well controlled.  HR range  59-82 - continuous cardiac monitoring.  - continue home meds: Amiodarone 200 mg daily, Eliquis 5 mg BID    QTc prolongation.  QTc 508 on 11/23/19. Today 463.  Patient with hx of QTc prolongation.  - Discontinue home citalopram- consider alternative SSRI - Avoid QTc prolonging meds- discontinue Haloperidol, Azithromycin, Celexa  - Repeat ECG s/p Haldol - Follow up AM EKG    HTN Home med losartan. BP ranged 104/78 - 154/64.  - Continue home med - Monitor blood pressure    Chronic pain/restless leg syndrome Home meds- lyrica, trazodone, meclizine, citalopram. - hold citalopram for QT prolonging effects, consider alternative SSRI - avoid narcotics for respiratory depression effects - Consider scheduling Tylenol 650 mg q6h  Tobacco use Patient vapes but has been trying to quit.  - 21 mg Nicotine patch  - Encourage smoking cessation   Difficulty Sleeping  - Melatonin 3 mg po qhs PRN   FEN/GI: Heart healthy  PPx: Eliquis   Disposition: Pending medical clearance and PT/OT eval   Subjective:  Martha Mora continues to have shortness of breath  with movement and anxiety.   Objective: Temp:  [98.6 F (37 C)-100.4 F (38 C)] 98.9 F (37.2 C) (01/07 0324) Pulse Rate:  [60-82] 80 (01/07 0324) Resp:  [16-23] 20 (01/07 0324) BP: (104-154)/(63-131) 151/83 (01/07 0324) SpO2:  [91 %-100 %] 94 % (01/07 0324) Weight:  [106.8 kg] 106.8 kg (01/07 0324)  Physical Exam:  General:  well developed female speaking in short sentences after moving from chair to bed Cardiovascular: regular rate and rhythm,  No murmurs appreciated  Respiratory: clear to ascultation bilaterally, pursed lips, short sentences, resolved with rest, 5L [place] Abdomen: obese abdomen, soft, non-tender, non-distended  Extremities: 2+/3+ bilateral LE edema to the knee,  Moving all extremities appropriately  Skin: warm, dry, no cyanosis   Laboratory: Recent Labs  Lab 11/22/19 2333 11/23/19 0514 11/24/19 0449 11/25/19 0217  WBC 16.1*  --  22.0* 20.0*  HGB 11.1* 9.9* 10.8* 10.2*  HCT 38.2 29.0* 37.0 35.1*  PLT 261  --  270 302   Recent Labs  Lab 11/22/19 2333 11/23/19 0514 11/24/19 0449 11/25/19 0217  NA 141 141 142 139  K 4.0 3.6 4.6 4.0  CL 109  --  111 105  CO2 22  --  21* 24  BUN 15  --  24* 24*  CREATININE 0.79  --  0.80 0.89  CALCIUM 8.2*  --  7.4* 8.1*  GLUCOSE 115*  --  147* 146*      Imaging/Diagnostic Tests: No results found.   [name], DO PGY-1, [facility] 11/25/2019 7:33 AM    FPTS Intern pager: [phone], text pages welcome

## 2019-11-25 NOTE — Progress Notes (Signed)
[place] Progress Note Patient Name: Martha Mora DOB: Nov 11, 1946 MRN: [id number]   Date of Service  11/25/2019  HPI/Events of Note  Multiple issues: 1. Patient vapes at home and requests a Nicotine patch and 2. Anxiety -   eICU Interventions  Will order: 1. Nicotine patch 21 mg to skin per day.  2. Melatonin 3 mg PO Q HS PRN sleep. Pharmacy to enter as verbal order.      Intervention Category Major Interventions: Other:  Silus Lanzo Cornelia Copa 11/25/2019, 1:35 AM

## 2019-11-25 NOTE — Progress Notes (Signed)
With on and off shaking of the body which she claimed she has this for a while. Also complaining of runny nose, tylenol given. MD made aware , continue to monitor.

## 2019-11-25 NOTE — Evaluation (Signed)
Physical Therapy Evaluation Patient Details Name: Martha Mora MRN: [id number] DOB: June 07, 1946 Today's Date: 11/25/2019   History of Present Illness  74 yo female admitted to ED on 1/4 with shortness of breath and severe anxiety, in acute hypoxic respiratory failure secondary to viral PNA with possible bacterial involvement. Intubated 1/5, self-extubated 1/6. PMH includes GERD, HLD, CAD, afib, COPD, HTN, breast cancer in remission, RLS, anxiety, MI, obesity, osteoporosis, history of respiratory illness requiring intubation 02/2019.  Clinical Impression   Pt presents with generalized weakness, difficulty performing mobility tasks, dyspnea on exertion accompanied by O2 desats, unsteadiness in standing with history of falls, and decreased activity tolerance. Pt to benefit from acute PT to address deficits. Pt ambulated short hallway distance, requiring combination of seated and standing rest breaks to recover desats and work of breathing. PT educated pt on pursed lip breathing technique to recover, pt performs technique well. Pt plans to d/c home with daughter with her assist, PT recommending HHPT to address mobility, cardiopulmonary, and endurance deficits. PT to progress mobility as tolerated, and will continue to follow acutely.   SpO2, 3LO2 via [place]: 86-96%    Follow Up Recommendations Home health PT;Supervision/Assistance - 24 hour    Equipment Recommendations  None recommended by PT    Recommendations for Other Services       Precautions / Restrictions Precautions Precautions: Fall Restrictions Weight Bearing Restrictions: No      Mobility  Bed Mobility Overal bed mobility: Needs Assistance             General bed mobility comments: pt up in chair upon PT arrival, requesting stay in chair upon PT exit.  Transfers Overall transfer level: Needs assistance Equipment used: Straight cane Transfers: Sit to/from Stand Sit to Stand: Min guard         General transfer  comment: min guard for safety, pt with increased time to rise and steady.  Ambulation/Gait Ambulation/Gait assistance: Min guard Gait Distance (Feet): 80 Feet Assistive device: Straight cane Gait Pattern/deviations: Step-through pattern;Decreased stride length;Trunk flexed Gait velocity: decr   General Gait Details: min guard for safety, PT with lines/leads management. Pt with forward flexed posture, required x2 standing rest breaks and one seated rest break in recliner. Sats 86% on 3LO2, recovered >92% on 3L with pursed lip breathing technique.  Stairs            Wheelchair Mobility    Modified Rankin (Stroke Patients Only)       Balance Overall balance assessment: Needs assistance;History of Falls Sitting-balance support: No upper extremity supported Sitting balance-Leahy Scale: Good     Standing balance support: Single extremity supported Standing balance-Leahy Scale: Fair Standing balance comment: ambulates with cane, cannot accept challenge                             Pertinent Vitals/Pain Pain Assessment: No/denies pain    Home Living Family/patient expects to be discharged to:: Private residence Living Arrangements: Alone Available Help at Discharge: Family;Available 24 hours/day Type of Home: House Home Access: Stairs to enter Entrance Stairs-Rails: Right Entrance Stairs-Number of Steps: 4 Home Layout: Able to live on main level with bedroom/bathroom Home Equipment: Walker - 2 wheels;Cane - single point;Bedside commode      Prior Function Level of Independence: Independent with assistive device(s)         Comments: Pt reports using cane for ambulation PTA, was doing everything for herself but was having difficulty with this.  Pt plans to d/c to daughter's house post-acutely.     Hand Dominance   Dominant Hand: Right    Extremity/Trunk Assessment   Upper Extremity Assessment Upper Extremity Assessment: Defer to OT evaluation     Lower Extremity Assessment Lower Extremity Assessment: Generalized weakness    Cervical / Trunk Assessment Cervical / Trunk Assessment: Normal  Communication   Communication: No difficulties  Cognition Arousal/Alertness: Awake/alert Behavior During Therapy: Anxious Overall Cognitive Status: Within Functional Limits for tasks assessed                                 General Comments: Pt very anxious and trembling, often taking off O2 when short of breath due to clautrophobia even with max encouragement to keep O2 in place.      General Comments      Exercises     Assessment/Plan    PT Assessment Patient needs continued PT services  PT Problem List Decreased strength;Decreased mobility;Decreased safety awareness;Decreased activity tolerance;Decreased balance;Decreased knowledge of use of DME;Cardiopulmonary status limiting activity       PT Treatment Interventions DME instruction;Therapeutic activities;Gait training;Therapeutic exercise;Patient/family education;Balance training;Stair training;Functional mobility training    PT Goals (Current goals can be found in the Care Plan section)  Acute Rehab PT Goals Patient Stated Goal: get stronger, be able to breathe PT Goal Formulation: With patient Time For Goal Achievement: 12/09/19 Potential to Achieve Goals: Good    Frequency Min 3X/week   Barriers to discharge        Co-evaluation               AM-PAC PT "6 Clicks" Mobility  Outcome Measure Help needed turning from your back to your side while in a flat bed without using bedrails?: A Little Help needed moving from lying on your back to sitting on the side of a flat bed without using bedrails?: A Little Help needed moving to and from a bed to a chair (including a wheelchair)?: A Little Help needed standing up from a chair using your arms (e.g., wheelchair or bedside chair)?: A Little Help needed to walk in hospital room?: A Little Help needed  climbing 3-5 steps with a railing? : A Lot 6 Click Score: 17    End of Session Equipment Utilized During Treatment: Gait belt;Oxygen Activity Tolerance: Patient tolerated treatment well;Patient limited by fatigue Patient left: in chair;with call bell/phone within reach(pt already in chair with RN, no  chair alarm present) Nurse Communication: Mobility status PT Visit Diagnosis: Difficulty in walking, not elsewhere classified (R26.2);Muscle weakness (generalized) (M62.81)    Time: [id number] PT Time Calculation (min) (ACUTE ONLY): 17 min   Charges:   PT Evaluation $PT Eval Low Complexity: 1 Low         Hamad Whyte E, PT Acute Rehabilitation Services Pager [phone]  Office [phone]  Tkai Serfass D Elonda Husky 11/25/2019, 5:35 PM

## 2019-11-25 NOTE — Progress Notes (Signed)
Spoke with  Infection prevention who claimed that there is no need for droplet isolation  For + rhinovirus from resp. panel.

## 2019-11-26 DIAGNOSIS — I10 Essential (primary) hypertension: Secondary | ICD-10-CM

## 2019-11-26 LAB — GLUCOSE, CAPILLARY
Glucose-Capillary: 101 mg/dL — ABNORMAL HIGH (ref 70–99)
Glucose-Capillary: 101 mg/dL — ABNORMAL HIGH (ref 70–99)
Glucose-Capillary: 101 mg/dL — ABNORMAL HIGH (ref 70–99)
Glucose-Capillary: 105 mg/dL — ABNORMAL HIGH (ref 70–99)
Glucose-Capillary: 107 mg/dL — ABNORMAL HIGH (ref 70–99)
Glucose-Capillary: 108 mg/dL — ABNORMAL HIGH (ref 70–99)

## 2019-11-26 LAB — BASIC METABOLIC PANEL
Anion gap: 11 (ref 5–15)
BUN: 22 mg/dL (ref 8–23)
CO2: 23 mmol/L (ref 22–32)
Calcium: 8.2 mg/dL — ABNORMAL LOW (ref 8.9–10.3)
Chloride: 103 mmol/L (ref 98–111)
Creatinine, Ser: 0.77 mg/dL (ref 0.44–1.00)
GFR calc Af Amer: >60 mL/min (ref >60)
GFR calc non Af Amer: >60 mL/min (ref >60)
Glucose, Bld: 110 mg/dL — ABNORMAL HIGH (ref 70–99)
Potassium: 3.8 mmol/L (ref 3.5–5.1)
Sodium: 137 mmol/L (ref 135–145)

## 2019-11-26 LAB — CULTURE, RESPIRATORY W GRAM STAIN: Culture: NORMAL

## 2019-11-26 LAB — CBC
HCT: 37 % (ref 36.0–46.0)
Hemoglobin: 11 g/dL — ABNORMAL LOW (ref 12.0–15.0)
MCH: 27.3 pg (ref 26.0–34.0)
MCHC: 29.7 g/dL — ABNORMAL LOW (ref 30.0–36.0)
MCV: 91.8 fL (ref 80.0–100.0)
Platelets: 383 10*3/uL (ref 150–400)
RBC: 4.03 MIL/uL (ref 3.87–5.11)
RDW: 15.5 % (ref 11.5–15.5)
WBC: 17.7 10*3/uL — ABNORMAL HIGH (ref 4.0–10.5)
nRBC: 0 % (ref 0.0–0.2)

## 2019-11-26 MED ORDER — BUSPIRONE HCL 10 MG PO TABS
5.0000 mg | ORAL_TABLET | Freq: Two times a day (BID) | ORAL | Status: DC
  Administered 2019-11-26 – 2019-11-27 (×2): 5 mg via ORAL
  Filled 2019-11-26 (×2): qty 1

## 2019-11-26 MED ORDER — LORAZEPAM 1 MG PO TABS
1.0000 mg | ORAL_TABLET | Freq: Once | ORAL | Status: AC
  Administered 2019-11-26: 1 mg via ORAL
  Filled 2019-11-26: qty 1

## 2019-11-26 MED ORDER — CEFDINIR 300 MG PO CAPS
300.0000 mg | ORAL_CAPSULE | Freq: Two times a day (BID) | ORAL | Status: DC
  Administered 2019-11-27 – 2019-11-28 (×3): 300 mg via ORAL
  Filled 2019-11-26 (×4): qty 1

## 2019-11-26 MED ORDER — ACETAMINOPHEN 325 MG PO TABS
650.0000 mg | ORAL_TABLET | Freq: Four times a day (QID) | ORAL | Status: DC
  Administered 2019-11-26 – 2019-11-28 (×9): 650 mg via ORAL
  Filled 2019-11-26 (×9): qty 2

## 2019-11-26 MED ORDER — LORAZEPAM 0.5 MG PO TABS: 0.5000 mg | ORAL_TABLET | Freq: Two times a day (BID) | ORAL | Status: DC | PRN

## 2019-11-26 MED ORDER — CEFDINIR 300 MG PO CAPS: 300.0000 mg | ORAL_CAPSULE | Freq: Two times a day (BID) | ORAL | Status: DC

## 2019-11-26 MED ORDER — AMLODIPINE BESYLATE 5 MG PO TABS
5.0000 mg | ORAL_TABLET | Freq: Every day | ORAL | Status: DC
  Administered 2019-11-26 – 2019-11-27 (×2): 5 mg via ORAL
  Filled 2019-11-26 (×2): qty 1

## 2019-11-26 MED ORDER — LORAZEPAM 0.5 MG PO TABS
0.5000 mg | ORAL_TABLET | Freq: Once | ORAL | Status: AC
  Administered 2019-11-26: 01:00:00 0.5 mg via ORAL
  Filled 2019-11-26: qty 1

## 2019-11-26 MED ORDER — LIDOCAINE 5 % EX PTCH
1.0000 | MEDICATED_PATCH | CUTANEOUS | Status: DC
  Administered 2019-11-26 – 2019-11-27 (×3): 1 via TRANSDERMAL
  Filled 2019-11-26 (×3): qty 1

## 2019-11-26 MED ORDER — LORAZEPAM 0.5 MG PO TABS
0.5000 mg | ORAL_TABLET | Freq: Every evening | ORAL | Status: DC | PRN
  Administered 2019-11-26 – 2019-11-27 (×2): 0.5 mg via ORAL
  Filled 2019-11-26 (×2): qty 1

## 2019-11-26 MED ORDER — LORATADINE 10 MG PO TABS
10.0000 mg | ORAL_TABLET | Freq: Every day | ORAL | Status: DC
  Administered 2019-11-26 – 2019-11-28 (×3): 10 mg via ORAL
  Filled 2019-11-26 (×3): qty 1

## 2019-11-26 MED ORDER — LOSARTAN POTASSIUM 50 MG PO TABS
100.0000 mg | ORAL_TABLET | Freq: Every day | ORAL | Status: DC
  Administered 2019-11-26 – 2019-11-28 (×3): 100 mg via ORAL
  Filled 2019-11-26 (×3): qty 2

## 2019-11-26 NOTE — Progress Notes (Signed)
FPTS Interim Progress Note  Paged by RN that pt was tremulous and anxious. Was given ativan 0.25mg  earlier this evening and responded well. Went to see pt who was sitting up in her chair shaking. Expressed her concerns about being worried and restless which was worrying her even more. Dr Shan Levans and I reassured Martha Mora that she is considerably better compared to admission and that she is doing very well. Pt complained about back pain for which we said we would prescribe lidocaine patch and kpad for. We were able to redirect her anxiety after 5-10 mins of conversation and pt was very grateful for [place] coming to see her. Prescribed further 0.5mg  Ativan, kpad and lidocaine patch.   Lattie Haw, MD 11/26/2019, 1:13 AM PGY-1, [facility] pager [phone]

## 2019-11-26 NOTE — Progress Notes (Signed)
Called to pt's room and assisted to [facility] and back to bed.  Pt anxious and stated "like it was last night. Shaky, nauseas and anxious."    Bp 182/115.  M[place] called.  Will continue to monitor pt. [name]

## 2019-11-26 NOTE — Consult Note (Signed)
   [facility] Inpatient Consult   11/26/2019  Martha Mora 1946-08-02 [id number]   Patient screened for high risk score for unplanned readmission score  and for hospitalizations to check if potential [facility] services are needed.  Review of patient's medical record reveals patient is viral pneumonia and was intubated and self extubated per notes.   Primary Care Provider is  [facility], this provider is a [place] is:  [employer]     Plan:  Follow up with [facility] and assign to Pneumonia EMMI follow up, as deemed appropriate.  Continue to follow progress and disposition to assess for post hospital care management needs.  Reviewed inpatient [facility] RNCM notes for updates.  Please place a [facility] Care Management consult as appropriate and for questions contact:   Natividad Brood, RN BSN [facility] Liaison  [phone] business mobile phone Toll free office [phone]  Fax number: [phone] [email or web address]

## 2019-11-26 NOTE — Evaluation (Signed)
Occupational Therapy Evaluation Patient Details Name: Martha Mora MRN: [id number] DOB: [birth year] Today's Date: 11/26/2019    History of Present Illness 74 yo female admitted to ED on 1/4 with shortness of breath and severe anxiety, in acute hypoxic respiratory failure secondary to viral PNA with possible bacterial involvement. Intubated 1/5, self-extubated 1/6. PMH includes GERD, HLD, CAD, afib, COPD, HTN, breast cancer in remission, RLS, anxiety, MI, obesity, osteoporosis, history of respiratory illness requiring intubation 02/2019.   Clinical Impression   Pt was walking with a cane, but functioning independently prior to admission. She present with impaired standing balance and decreased activity tolerance and is much more stable with use of RW vs her cane. Will follow acutely. Do not anticipate pt will need post acute OT.    Follow Up Recommendations  No OT follow up    Equipment Recommendations  Tub/shower seat    Recommendations for Other Services       Precautions / Restrictions Precautions Precautions: Fall Restrictions Weight Bearing Restrictions: No      Mobility Bed Mobility Overal bed mobility: Modified Independent                Transfers Overall transfer level: Needs assistance Equipment used: Rolling walker (2 wheeled) Transfers: Sit to/from Stand Sit to Stand: Supervision         General transfer comment: supervision for safety, pt requesting to use RW instead of cane    Balance Overall balance assessment: Needs assistance;History of Falls   Sitting balance-Leahy Scale: Good     Standing balance support: Single extremity supported Standing balance-Leahy Scale: Fair                             ADL either performed or assessed with clinical judgement   ADL Overall ADL's : Needs assistance/impaired Eating/Feeding: Independent;Bed level   Grooming: Wash/dry hands;Standing;Supervision/safety   Upper Body Bathing: Set  up;Sitting   Lower Body Bathing: Supervison/ safety;Sit to/from stand   Upper Body Dressing : Set up;Sitting   Lower Body Dressing: Supervision/safety;Sit to/from stand   Toilet Transfer: Supervision/safety;Stand-pivot;BSC   Toileting- Water quality scientist and Hygiene: Supervision/safety;Sit to/from Nurse, children's Details (indicate cue type and reason): recommended seated showering Functional mobility during ADLs: Supervision/safety;Rolling walker General ADL Comments: pt more stable to RW, may benefit from a rollator     Vision Patient Visual Report: No change from baseline       Perception     Praxis      Pertinent Vitals/Pain Pain Assessment: No/denies pain     Hand Dominance Right   Extremity/Trunk Assessment Upper Extremity Assessment Upper Extremity Assessment: Overall WFL for tasks assessed   Lower Extremity Assessment Lower Extremity Assessment: Defer to PT evaluation   Cervical / Trunk Assessment Cervical / Trunk Assessment: (obesity)   Communication Communication Communication: No difficulties   Cognition Arousal/Alertness: Awake/alert Behavior During Therapy: WFL for tasks assessed/performed Overall Cognitive Status: Within Functional Limits for tasks assessed                                 General Comments: pt asking why she keeps getting PNA   General Comments       Exercises     Shoulder Instructions      Home Living Family/patient expects to be discharged to:: Private residence Living Arrangements: Alone Available Help at Discharge: Family;Available 24 hours/day  Type of Home: House Home Access: Stairs to enter [employer] of Steps: 4 Entrance Stairs-Rails: Right Home Layout: Able to live on main level with bedroom/bathroom     Bathroom Shower/Tub: Teacher, early years/pre: Standard     Home Equipment: Environmental consultant - 2 wheels;Cane - single point;Bedside commode          Prior  Functioning/Environment Level of Independence: Independent with assistive device(s)        Comments: Pt reports using cane for ambulation PTA, was doing everything for herself but was having difficulty with this. Pt plans to d/c to daughter's house post-acutely.        OT Problem List: Impaired balance (sitting and/or standing);Decreased activity tolerance;Decreased knowledge of use of DME or AE      OT Treatment/Interventions: Self-care/ADL training;DME and/or AE instruction;Energy conservation;Patient/family education;Balance training;Therapeutic activities    OT Goals(Current goals can be found in the care plan section) Acute Rehab OT Goals Patient Stated Goal: get stronger, be able to breathe OT Goal Formulation: With patient Time For Goal Achievement: 12/10/19 Potential to Achieve Goals: Good ADL Goals Pt Will Perform Tub/Shower Transfer: with modified independence;ambulating;shower seat;tub bench;rolling walker(determine shower seat vs tub bench with pt) Additional ADL Goal #1: Pt will state at least 3 energy conservation strategies and utilize pursed lip breathing techniques during ADL.  OT Frequency: Min 2X/week   Barriers to D/C:            Co-evaluation              AM-PAC OT "6 Clicks" Daily Activity     Outcome Measure Help from another person eating meals?: None Help from another person taking care of personal grooming?: A Little Help from another person toileting, which includes using toliet, bedpan, or urinal?: A Little Help from another person bathing (including washing, rinsing, drying)?: A Little Help from another person to put on and taking off regular upper body clothing?: None Help from another person to put on and taking off regular lower body clothing?: A Little 6 Click Score: 20   End of Session Equipment Utilized During Treatment: Gait belt;Rolling walker  Activity Tolerance: Patient tolerated treatment well Patient left: in bed;with call  bell/phone within reach  OT Visit Diagnosis: Unsteadiness on feet (R26.81);Other abnormalities of gait and mobility (R26.89);Muscle weakness (generalized) (M62.81);Other (comment);History of falling (Z91.81)(decreased activity tolerance)                Time: 1115-1131 OT Time Calculation (min): 16 min Charges:  OT General Charges $OT Visit: 1 Visit OT Evaluation $OT Eval Moderate Complexity: 1 Mod  Nestor Lewandowsky, OTR/L Acute Rehabilitation Services Pager: [phone] Office: [phone]  Martha Mora 11/26/2019, 11:57 AM

## 2019-11-26 NOTE — Plan of Care (Signed)

## 2019-11-26 NOTE — Progress Notes (Signed)
Physical Therapy Treatment Patient Details Name: Martha Mora MRN: [id number] DOB: [birth year] Today's Date: 11/26/2019    History of Present Illness 74 yo female admitted to ED on 1/4 with shortness of breath and severe anxiety, in acute hypoxic respiratory failure secondary to viral PNA with possible bacterial involvement. Intubated 1/5, self-extubated 1/6. PMH includes GERD, HLD, CAD, afib, COPD, HTN, breast cancer in remission, RLS, anxiety, MI, obesity, osteoporosis, history of respiratory illness requiring intubation 02/2019.    PT Comments    Pt limited by fatigue during session, although she reports limited activity tolerance at baseline. Pt requires less assistance during session, mobilizing at a supervision level with PT support for line management. Pt requiring supplemental oxygen for all functional mobility, needing 5-6L [place] durign ambulation to maintain saturation at or above 92%. Pt will benefit from continued acute PT services to improve activity tolerance. PT now recommending HHPT with transition to OP cardiopulmonary rehab upon discharge.   Follow Up Recommendations  Home health PT(HHPT with transition to OP Cardiopulmonary rehab)     Equipment Recommendations  (pt owns necessary DME)    Recommendations for Other Services       Precautions / Restrictions Precautions Precautions: Fall Restrictions Weight Bearing Restrictions: No    Mobility  Bed Mobility                  Transfers Overall transfer level: Modified independent Equipment used: Straight cane Transfers: Sit to/from Stand Sit to Stand: Modified independent (Device/Increase time)            Ambulation/Gait Ambulation/Gait assistance: Supervision Gait Distance (Feet): 40 Feet Assistive device: Straight cane Gait Pattern/deviations: Step-to pattern;Wide base of support Gait velocity: reduced Gait velocity interpretation: 1.31 - 2.62 ft/sec, indicative of limited community  ambulator General Gait Details: pt with short step to gait with reduced stride length and increased lateral sway   Stairs             Wheelchair Mobility    Modified Rankin (Stroke Patients Only)       Balance Overall balance assessment: Needs assistance Sitting-balance support: No upper extremity supported Sitting balance-Leahy Scale: Good Sitting balance - Comments: independent   Standing balance support: Single extremity supported Standing balance-Leahy Scale: Good Standing balance comment: supervision with cane                            Cognition Arousal/Alertness: Awake/alert Behavior During Therapy: WFL for tasks assessed/performed Overall Cognitive Status: Within Functional Limits for tasks assessed                                        Exercises      General Comments General comments (skin integrity, edema, etc.): pt with 5L HFNC running upon PT arrival however canula not donned. Pt saturating in low 90s at rest. During ambulation pt quickly desaturates into mid 80s, requiring 6L [place] to regain 92% saturation or above.  Pt symptomatic with increased work of breathing during mobility along with desaturation      Pertinent Vitals/Pain Pain Assessment: No/denies pain    Home Living                      Prior Function            PT Goals (current goals can now be found in the care  plan section) Acute Rehab PT Goals Patient Stated Goal: get stronger, be able to breathe Progress towards PT goals: Progressing toward goals    Frequency    Min 3X/week      PT Plan Current plan remains appropriate    Co-evaluation              AM-PAC PT "6 Clicks" Mobility   Outcome Measure  Help needed turning from your back to your side while in a flat bed without using bedrails?: A Little Help needed moving from lying on your back to sitting on the side of a flat bed without using bedrails?: A Little Help needed  moving to and from a bed to a chair (including a wheelchair)?: None Help needed standing up from a chair using your arms (e.g., wheelchair or bedside chair)?: None Help needed to walk in hospital room?: None Help needed climbing 3-5 steps with a railing? : A Lot 6 Click Score: 20    End of Session Equipment Utilized During Treatment: Oxygen Activity Tolerance: Patient limited by fatigue Patient left: in chair;with call bell/phone within reach;with chair alarm set Nurse Communication: Mobility status PT Visit Diagnosis: Difficulty in walking, not elsewhere classified (R26.2);Muscle weakness (generalized) (M62.81)     Time: [id number] PT Time Calculation (min) (ACUTE ONLY): 13 min  Charges:  $Gait Training: 8-22 mins                     Zenaida Niece, PT, DPT Acute Rehabilitation Pager: [phone]    Zenaida Niece 11/26/2019, 3:08 PM

## 2019-11-26 NOTE — Progress Notes (Signed)
Family Medicine Teaching Service Daily Progress Note Intern Pager: [phone]  Patient name: Nature Millet Medical record number: [id number] Date of birth: 08-02-1946 Age: 74 y.o. Gender: female  Primary Care Provider: [name], DO Consultants: CCM,  Code Status: Full   Pt Overview and Major Events to Date:  11/23/19: Admitted  11/24/19: Self Extubated  11/25/19: Returns to FMTS   Assessment and Plan: Kamala Pendleton is a 74 y.o. female who presented with respiratory distress. PMH is significant for h/o breast cancer, anxiety, HTN, CAD, esophageal stricture, GERD, diverticulosis, IBS, OA, OSA, MVR, RLS, mixed urinary incontinence, h/o respiratory distress requiring intubation 02/2019.   Acute Hypoxic Respiratory Failure  CAP  Enterovirus  Afebrile overnight. Leukocytosis downtrended today (WBC 17.7 from 20) . Satting in low 80s on RA however satting appropriately on 5L.  Will wean oxygen as tolerated.  Covid and influzena A/B testing was negative. RVP +Rhinovirus. Continue CTX and doxycycline for CAP. BCx no growth Day 2/5. Respiratory culture re-incubated. Gm stain Gm+ cocci, Gm, + rods and few yeast.  Consider fluconazole. Reports LE edema at home as well. Encourage LE elevation. Consider IV Lasix if no improvement in respiratory status. CXR showed mild CHF. Patient with normal PFTs in Feb 2018 not likely COPD.  - CTX 1g (1/5 - to end 1/11 ) - s/p Azithromycin (1/5) - Doxyxycline 100 mg BID (1/7 - to end 1/11 ) - s/p Solumedrol 125mg  (1/5 - 1/6) - Monitor respiratory status  - Wean oxygen as tolerated  - Follow up BCx : no growth Day 2 / 5 - Follow up Respiratory Cx - Monitor fever curve - PT / OT eval and treat : ambulatory desaturation test prior to discharge  - Encourage LE elevation    Delirium  GAD  Panic Disorder  Required additional 1mg  dose of Ativan for anxiety. Has been anxious for past 2-3 weeks. Home medications include Lyrica, Celexa, Trazadone  - Hold  home medications  - Continue Duloxetine 20 mg  - Low dose Ativan 0.5 mg BID PRN - Consider 1 mg dose at bedtime  - Delirium precautions  - Discontinue monitors when able     AFIB  CAD  HLD  Home meds: Eliquis, Amiodarone, Rosuvastatin. HR well controlled.  HR range 67-77 - continuous cardiac monitoring.  - continue home meds: Amiodarone 200 mg daily, Eliquis 5 mg BID    QTc prolongation.  QTc 508 on 11/23/19.QTc 463 yesterday.  Patient with hx of QTc prolongation.  - Discontinue home citalopram- consider alternative SSRI - Avoid QTc prolonging meds- discontinue Haloperidol, Azithromycin, Celexa     HTN Home med losartan. Hypertensive today.  BP range 144/52 - 187/75.  - Restart home med 100 mg Losartan  - Monitor blood pressure    Chronic pain/restless leg syndrome Patient reported back pain last night.  Given Kpad. Will schedule tylenol today.  Home meds- lyrica, trazodone, meclizine, citalopram. - holding home meds  - avoid narcotics for respiratory depression effects - Scheduled Tylenol 650 mg q6h    Tobacco use Patient vapes but has been trying to quit.  - 21 mg Nicotine patch  - Encourage smoking cessation   Difficulty Sleeping  - Melatonin 3 mg po qhs PRN   FEN/GI: Heart healthy  PPx: Eliquis   Disposition: Pending medical clearance and PT/OT eval   Subjective:  Vione Everton continues to be SOB with movement.   Objective: Temp:  [97.9 F (36.6 C)-99 F (37.2 C)] 98.6 F (37 C) (01/08  0700) Pulse Rate:  [67-79] 79 (01/08 0700) Resp:  [16-22] 20 (01/08 0700) BP: (144-187)/(52-80) 179/80 (01/08 0700) SpO2:  [82 %-97 %] 82 % (01/08 0700) Weight:  [105.7 kg] 105.7 kg (01/08 0330)  Physical Exam:  GEN: pleasant elderly female, sitting upright in chair on RA CV: regular rate and rhythm, no murmurs appreciated  RESP: no increased work of breathing, clear to ascultation bilaterally with no crackles, wheezes, or rhonchi, satting in 82% on RA,  asked patient to place [place] back on  ABD: Bowel sounds present. Soft, Nontender, Nondistended. MSK: 2+/3+ lower extremity edema,  SKIN: warm, dry, no cyanosis noted NEURO: grossly normal, moves all extremities appropriately    Laboratory: Recent Labs  Lab 11/24/19 0449 11/25/19 0217 11/26/19 0432  WBC 22.0* 20.0* 17.7*  HGB 10.8* 10.2* 11.0*  HCT 37.0 35.1* 37.0  PLT 270 302 383   Recent Labs  Lab 11/24/19 0449 11/25/19 0217 11/26/19 0432  NA 142 139 137  K 4.6 4.0 3.8  CL 111 105 103  CO2 21* 24 23  BUN 24* 24* 22  CREATININE 0.80 0.89 0.77  CALCIUM 7.4* 8.1* 8.2*  GLUCOSE 147* 146* 110*      Imaging/Diagnostic Tests: No results found.   [name], DO PGY-1, [facility] 11/26/2019 7:44 AM    FPTS Intern pager: [phone], text pages welcome

## 2019-11-26 NOTE — TOC Initial Note (Signed)
Transition of Care [facility]) - Initial/Assessment Note    Patient Details  Name: Martha Mora MRN: [id number] Date of Birth: 04-22-1946  Transition [facility]) CM/SW Contact:    Zenon Mayo, RN Phone Number: 11/26/2019, 1:31 PM  Clinical Narrative:                 From home alone, she has transportation at dc, [facility].  She states if she needs home oxygen she would like to work with [employer], referral made to [place].  We will need ambulatory sats and orders.  TOC team will continue to follow for dc needs.   Expected Discharge Plan: Home/Self Care Barriers to Discharge: Continued Medical Work up   Patient Goals and CMS Choice Patient states their goals for this hospitalization and ongoing recovery are:: feel better , sleep better and get back to where she should be CMS Medicare.gov Compare Post Acute Care list provided to:: Patient Choice offered to / list presented to : Patient  Expected Discharge Plan and Services Expected Discharge Plan: Home/Self Care In-house Referral: NA Discharge Planning Services: CM Consult Post Acute Care Choice: Durable Medical Equipment Living arrangements for the past 2 months: Single Family Home                 DME Arranged: Oxygen DME Agency: [facility] Date DME Agency Contacted: 11/26/19 Time DME Agency Contacted: 70 Representative spoke with at DME Agency: [place]: NA          Prior Living Arrangements/Services Living arrangements for the past 2 months: [place] Lives with:: Self Patient language and need for interpreter reviewed:: Yes        Need for Family Participation in Patient Care: No (Comment) Care giver support system in place?: No (comment)   Criminal Activity/Legal Involvement Pertinent to Current Situation/Hospitalization: No - Comment as needed  Activities of Daily Living Home Assistive Devices/Equipment: Cane (specify quad or straight), Dentures (specify  type), Eyeglasses, Scales(Upper) ADL Screening (condition at time of admission) Patient's cognitive ability adequate to safely complete daily activities?: Yes Is the patient deaf or have difficulty hearing?: No Does the patient have difficulty seeing, even when wearing glasses/contacts?: No Does the patient have difficulty concentrating, remembering, or making decisions?: Yes Patient able to express need for assistance with ADLs?: Yes Does the patient have difficulty dressing or bathing?: Yes Independently performs ADLs?: Yes (appropriate for developmental age) Does the patient have difficulty walking or climbing stairs?: Yes Weakness of Legs: Both Weakness of Arms/Hands: Both  Permission Sought/Granted                  Emotional Assessment   Attitude/Demeanor/Rapport: Engaged Affect (typically observed): Appropriate Orientation: : Oriented to Self, Oriented to Place, Oriented to  Time, Oriented to Situation Alcohol / Substance Use: Not Applicable Psych Involvement: No (comment)  Admission diagnosis:  Delirium [R41.0] SOB (shortness of breath) [R06.02] CAP (community acquired pneumonia) [J18.9] Acute respiratory failure with hypoxia ([place]) [J96.01] Community acquired pneumonia, unspecified laterality [J18.9] Patient Active Problem List   Diagnosis Date Noted  . CAP (community acquired pneumonia) 11/23/2019  . Delirium   . 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 07/16/2019  . Vaginal discharge 07/16/2019  . Renal stones 03/15/2019  . Acute respiratory failure with hypoxia ([place]) 02/22/2019  . Insomnia 09/08/2016  . Urinary incontinence, mixed 10/25/2015  . Restless leg syndrome 05/24/2015  . History of tobacco use 05/23/2014  . Mitral valve regurgitation 05/23/2014  . Dysfunctional  uterine bleeding 09/02/2013  . Breast cancer of lower-outer quadrant of right female breast ([place]) 07/24/2013  . Postmenopausal bleeding 07/14/2013  . Osteopenia 12/27/2011  . Anxiety 07/19/2009  .  Osteoarthritis 07/19/2009  . NAUSEA 07/19/2009  . IRRITABLE BOWEL SYNDROME 11/17/2008  . PERSONAL HX COLONIC POLYPS 07/19/2008  . COLONIC POLYPS, ADENOMATOUS 11/17/2007  . History of breast cancer 11/17/2007  . Hyperlipidemia 11/17/2007  . Obesity, unspecified 11/17/2007  . Depression 11/17/2007  . Essential hypertension 11/17/2007  . Coronary atherosclerosis 11/17/2007  . HEMORRHOIDS, INTERNAL 11/17/2007  . GERD 11/17/2007  . PEPTIC STRICTURE 11/17/2007  . OSA (obstructive sleep apnea) 11/17/2007  . Diverticulosis of colon (without mention of hemorrhage) 09/05/2005  . REFLUX ESOPHAGITIS 06/01/2002  . ESOPHAGEAL STRICTURE 06/01/2002   PCP:  [name], DO Pharmacy:   [id number] [place] [place] [id number] [place] [id number] Phone: [phone] Fax: [phone]  [id number] [place], [place] - [address] [place] [address] Phone: [phone] Fax: [phone]     Social Determinants of Health (SDOH) Interventions    Readmission Risk Interventions Readmission Risk Prevention Plan 11/26/2019  Transportation Screening Complete  PCP or Specialist Appt within 3-5 Days Complete  HRI or [place] Complete  Social Work Consult for [place] Planning/Counseling Complete  Palliative Care Screening Not Applicable  Medication Review Press photographer) Complete  Some recent data might be hidden

## 2019-11-27 LAB — GLUCOSE, CAPILLARY
Glucose-Capillary: 112 mg/dL — ABNORMAL HIGH (ref 70–99)
Glucose-Capillary: 108 mg/dL — ABNORMAL HIGH (ref 70–99)
Glucose-Capillary: 120 mg/dL — ABNORMAL HIGH (ref 70–99)
Glucose-Capillary: 112 mg/dL — ABNORMAL HIGH (ref 70–99)

## 2019-11-27 LAB — CBC
HCT: 36.1 % (ref 36.0–46.0)
Hemoglobin: 10.9 g/dL — ABNORMAL LOW (ref 12.0–15.0)
MCH: 27 pg (ref 26.0–34.0)
MCHC: 30.2 g/dL (ref 30.0–36.0)
MCV: 89.4 fL (ref 80.0–100.0)
Platelets: 345 10*3/uL (ref 150–400)
RBC: 4.04 MIL/uL (ref 3.87–5.11)
RDW: 15 % (ref 11.5–15.5)
WBC: 14.5 10*3/uL — ABNORMAL HIGH (ref 4.0–10.5)
nRBC: 0 % (ref 0.0–0.2)

## 2019-11-27 LAB — BASIC METABOLIC PANEL
Anion gap: 10 (ref 5–15)
BUN: 16 mg/dL (ref 8–23)
CO2: 25 mmol/L (ref 22–32)
Calcium: 8.5 mg/dL — ABNORMAL LOW (ref 8.9–10.3)
Chloride: 103 mmol/L (ref 98–111)
Creatinine, Ser: 0.72 mg/dL (ref 0.44–1.00)
GFR calc Af Amer: >60 mL/min (ref >60)
GFR calc non Af Amer: >60 mL/min (ref >60)
Glucose, Bld: 106 mg/dL — ABNORMAL HIGH (ref 70–99)
Potassium: 3.8 mmol/L (ref 3.5–5.1)
Sodium: 138 mmol/L (ref 135–145)

## 2019-11-27 MED ORDER — BUSPIRONE HCL 15 MG PO TABS
7.5000 mg | ORAL_TABLET | Freq: Three times a day (TID) | ORAL | Status: DC
  Administered 2019-11-27 – 2019-11-28 (×3): 7.5 mg via ORAL
  Filled 2019-11-27 (×3): qty 1

## 2019-11-27 MED ORDER — DULOXETINE HCL 20 MG PO CPEP
20.0000 mg | ORAL_CAPSULE | Freq: Once | ORAL | Status: AC
  Administered 2019-11-27: 20 mg via ORAL
  Filled 2019-11-27 (×2): qty 1

## 2019-11-27 MED ORDER — LORAZEPAM 0.5 MG PO TABS
0.5000 mg | ORAL_TABLET | Freq: Once | ORAL | Status: AC
  Administered 2019-11-27: 05:00:00 0.5 mg via ORAL
  Filled 2019-11-27: qty 1

## 2019-11-27 MED ORDER — AMLODIPINE BESYLATE 5 MG PO TABS
5.0000 mg | ORAL_TABLET | Freq: Once | ORAL | Status: AC
  Administered 2019-11-27: 09:00:00 5 mg via ORAL
  Filled 2019-11-27: qty 1

## 2019-11-27 MED ORDER — AMLODIPINE BESYLATE 10 MG PO TABS
10.0000 mg | ORAL_TABLET | Freq: Every day | ORAL | Status: DC
  Administered 2019-11-28: 10 mg via ORAL
  Filled 2019-11-27: qty 1

## 2019-11-27 MED ORDER — DULOXETINE HCL 30 MG PO CPEP: 30.0000 mg | ORAL_CAPSULE | Freq: Every day | ORAL | Status: DC

## 2019-11-27 MED ORDER — METOPROLOL TARTRATE 12.5 MG HALF TABLET
12.5000 mg | ORAL_TABLET | Freq: Two times a day (BID) | ORAL | Status: DC
  Administered 2019-11-27 – 2019-11-28 (×3): 12.5 mg via ORAL
  Filled 2019-11-27 (×3): qty 1

## 2019-11-27 MED ORDER — DULOXETINE HCL 20 MG PO CPEP
40.0000 mg | ORAL_CAPSULE | Freq: Every day | ORAL | Status: DC
  Administered 2019-11-28: 40 mg via ORAL
  Filled 2019-11-27: qty 2

## 2019-11-27 NOTE — Progress Notes (Signed)
FPTS Interim Progress Note  Paged by RN that pt was anxious, nauseous and shaking like last night. Went to review pt who was lying down on her bed but awake with the tv on. Reported feeling anxious, said that that the Ativan earlier in the night had helped a little. I asked what she usually does to calm her down and she said she plays on her phone and watches tv. She said her phone has run out of battery and her charger was not working. RN Bethena Roys kindly got a Games developer for her to charge her phone. RN also offered aromatherapy with Lavender oil which pt agreed to which helped calm pt down. I explained that I thought it was too soon to prescribe another Ativan as she has received her evening dose not too long ago. I discontinued continuous telemetry and pulse oximetry in order to make patient more comfortable and relaxed. Vital signs Q4H. Patient felt better after our conversation and redirection.  Lattie Haw PGY-1, [name] Family Medicine Service pager [phone]

## 2019-11-27 NOTE — Progress Notes (Signed)
Brief note: I will cosign resident's note once done.  The patient endorsed bloody mucus after a heavy sneeze. She stated that she feels it came from her lungs, although a sneeze preceded it. She denies worsening SOB or chest pain. She feels well otherwise.  There were no acute findings on her physical exam.  Acute Hypoxic Respiratory Failure: CAP  Now on 2 L O2, previously, she was not on home O2. This is likely her new baseline. She will benefit from outpatient cardiopulmonary rehab. Continue A/B regimen.  Hemoptysis: After sneezing. CTA of her lungs showed no evidence of pulmonary embolus and moderate severity areas of atelectasis and/or infiltrate within the bilateral lower lobes.  Less likely PE/TB with her report. As discussed with the patient, this could be due to her heavy sneeze. We will monitor and if this persists, we will evaluate her further.  She agreed with the plan.   HTN/Afib/CAD BP still running high. Continue Lorsatan. Increase Norvasc to 10 mg. Consider beta-blocker if BP remains elevated. Afib and CAD stable on anticoagulant and statins.  Chronic Macrocytic anemia: Anemia of chronic disease. Monitor closely.

## 2019-11-27 NOTE — Progress Notes (Signed)
[place] for heparin Indication: atrial fibrillation   Patient Measurements: Height: 5\' 1"  (154.9 cm) Weight: 226 lb 13.7 oz (102.9 kg) IBW/kg (Calculated) : 47.8 Heparin Dosing Weight: 70.4 kg  Vital Signs: Temp: 98 F (36.7 C) (01/09 1120) Temp Source: Oral (01/09 1120) BP: 193/58 (01/09 1120) Pulse Rate: 75 (01/09 1120)  Labs: Recent Labs    11/25/19 0217 11/26/19 0432 11/27/19 0150  HGB 10.2* 11.0* 10.9*  HCT 35.1* 37.0 36.1  PLT 302 383 345  CREATININE 0.89 0.77 0.72     Assessment: 74 yo female on chronic anticoagulation with Eliquis.   Scr stable at 0.72, H/H Plts WNL.  Goal of Therapy:  Monitor platelets by anticoagulation protocol: Yes   Plan:  -Continue Eliquis 5 mg BID.  Marguerite Olea, [facility] Clinical Pharmacist Phone [phone]  11/27/2019 3:31 PM

## 2019-11-27 NOTE — Progress Notes (Signed)
Pt called out.  Anxious with tremors.  Little short of breath without oxygen.  Up to bathroom.  Cold.  Temp 99.0. Had recently given tylenol.   Pt stated " something isn't right" " like when I was on the ambulance."  Wanted me to call Md as well. Md called.  Will continue to monitor. Saunders Revel T

## 2019-11-27 NOTE — Progress Notes (Signed)
BP high this am, morning medicines carried out for BP and anxiety, will recheck shortly, will continue to monitor  Palma Holter, RN

## 2019-11-27 NOTE — Progress Notes (Signed)
Pt very anxious. Md at bedside.  Lavender Aromatherapy used at bedside to try help pt with her anxiety.  Talked with pt.  Md felt like pt was stable enough to change status to Med Surg.  Pt did feel a little better once equipment was removed.  Will continue to monitor, and encourage relaxing breathing techniques. Along with TV shows and music. Saunders Revel T

## 2019-11-27 NOTE — Progress Notes (Addendum)
Family Medicine Teaching Service Daily Progress Note Intern Pager: [phone]  Patient name: Martha Mora Medical record number: [id number] Date of birth: [birth year] Age: 74 y.o. Gender: female  Primary Care Provider: [name], DO Consultants: CCM,  Code Status: Full   Pt Overview and Major Events to Date:  11/23/19: Admitted  11/24/19: Self Extubated  11/25/19: Returns to FMTS   Assessment and Plan: Martha Mora is a 74 y.o. female who presented with respiratory distress. PMH is significant for h/o breast cancer, anxiety, HTN, CAD, esophageal stricture, GERD, diverticulosis, IBS, OA, OSA, MVR, RLS, mixed urinary incontinence, h/o respiratory distress requiring intubation 02/2019.   Acute Hypoxic Respiratory Failure (resolved)  CAP  Enterovirus  Breathing comfortably on RA. On PO abx.  - CTX 1g (1/5 -1/8 ). Starting cefdinir on 1/9 to 1/11 - Azithromycin (1/5). On Doxyxycline 100 mg BID (1/7 - to end 1/11 ) - s/p Solumedrol 125mg  (1/5 - 1/6) - Monitor respiratory status  - Wean oxygen as tolerated  - Follow up BCx : no growth 4 days - Follow up Respiratory Cx: consistant w/ normal flora - Monitor fever curve - PT / OT eval and treat : ambulatory desaturation test prior to discharge. Recommend HH w/ 24 hr supervision.  - Encourage LE elevation    Delirium  GAD  Panic Disorder  Required multiple doses of ativan for anxiety. Has been anxious for past 2-3 weeks. Home medications include Lyrica, Celexa, Trazadone  - Hold home medications  - Continue Duloxetine 20 mg, increase to 40 mg today - Buspar increased to 7.5 mg TID - Ativan 0.5 mg BID PRN - Consider 1 mg dose at bedtime  - Delirium precautions   AFIB  CAD  HLD  Home meds: Eliquis, Amiodarone, Rosuvastatin. HR well controlled.  HR range 67-77 - continuous cardiac monitoring.  - continue home meds: Amiodarone 200 mg daily, Eliquis 5 mg BID   Hemoptysis Recurrent x1.  Likely due to the fact the patient  has current respiratory infection and is anticoagulant.  Hemoglobin is stable.  Continue to monitor for now.  Recommend possible scoping if worsens.  QTc prolongation.  QTc 508 on 11/23/19.QTc 463 yesterday.  Patient with hx of QTc prolongation.  - Discontinue home citalopram- consider alternative SSRI - Avoid QTc prolonging meds- discontinue Haloperidol, Azithromycin, Celexa   HTN SBP 160 - 200. Multifactorial including essential HTN and anxiety, we have increased anxiety meds, will also increase amlodipine - 100 mg Losartan  - amlodipine 10 mg - Monitor blood pressure   Chronic pain/restless leg syndrome Patient reported back pain last night.  Given Kpad. Will schedule tylenol today.  Home meds- lyrica, trazodone, meclizine, citalopram. - holding home meds  - avoid narcotics for respiratory depression effects - Scheduled Tylenol 650 mg q6h   Tobacco use Patient vapes but has been trying to quit.  - 21 mg Nicotine patch  - Encourage smoking cessation  Difficulty Sleeping  - Melatonin 3 mg po qhs PRN   FEN/GI: Heart healthy  PPx: Eliquis   Disposition: PT/OT given no longer on O2  Subjective:  [name] continues to be SOB with movement.   Objective: Temp:  [98 F (36.7 C)-99 F (37.2 C)] 98 F (36.7 C) (01/09 0805) Pulse Rate:  [63-76] 72 (01/09 0815) Resp:  [16-25] 18 (01/09 0805) BP: (160-220)/(57-115) 210/57 (01/09 0815) SpO2:  [92 %-100 %] 98 % (01/09 0805) Weight:  [102.9 kg] 102.9 kg (01/09 0500)  Physical Exam:  GEN: pleasant elderly  female, sitting upright in chair on RA CV: regular rate and rhythm, no murmurs appreciated  RESP: no increased work of breathing, clear to ascultation bilaterally with no crackles, wheezes, or rhonchi, satting in 82% on RA, asked patient to place [place] back on  ABD: Bowel sounds present. Soft, Nontender, Nondistended. MSK: 2+/3+ lower extremity edema,  SKIN: warm, dry, no cyanosis noted NEURO: grossly normal, moves all  extremities appropriately    Laboratory: Recent Labs  Lab 11/25/19 0217 11/26/19 0432 11/27/19 0150  WBC 20.0* 17.7* 14.5*  HGB 10.2* 11.0* 10.9*  HCT 35.1* 37.0 36.1  PLT 302 383 345   Recent Labs  Lab 11/25/19 0217 11/26/19 0432 11/27/19 0150  NA 139 137 138  K 4.0 3.8 3.8  CL 105 103 103  CO2 24 23 25   BUN 24* 22 16  CREATININE 0.89 0.77 0.72  CALCIUM 8.1* 8.2* 8.5*  GLUCOSE 146* 110* 106*      Imaging/Diagnostic Tests: No results found.  Marny Lowenstein, MD, MS FAMILY MEDICINE RESIDENT - PGy3 11/27/2019 10:01 AM

## 2019-11-27 NOTE — Progress Notes (Signed)
SATURATION QUALIFICATIONS: (This note is used to comply with regulatory documentation for home oxygen)  Patient Saturations on Room Air at Rest = 91%  Patient Saturations on Room Air while Ambulating = 86%  Patient Saturations on 2 Liters of oxygen while Ambulating = 92%  Pt's saturation was down to 86% while ambulating and she was having SOB. She needs home oxygen

## 2019-11-28 ENCOUNTER — Telehealth: Payer: Self-pay | Admitting: Family Medicine

## 2019-11-28 ENCOUNTER — Other Ambulatory Visit: Payer: Self-pay | Admitting: Family Medicine

## 2019-11-28 LAB — BASIC METABOLIC PANEL
Anion gap: 10 (ref 5–15)
BUN: 12 mg/dL (ref 8–23)
CO2: 25 mmol/L (ref 22–32)
Calcium: 8.6 mg/dL — ABNORMAL LOW (ref 8.9–10.3)
Chloride: 102 mmol/L (ref 98–111)
Creatinine, Ser: 0.73 mg/dL (ref 0.44–1.00)
GFR calc Af Amer: >60 mL/min (ref >60)
GFR calc non Af Amer: >60 mL/min (ref >60)
Glucose, Bld: 122 mg/dL — ABNORMAL HIGH (ref 70–99)
Potassium: 3.6 mmol/L (ref 3.5–5.1)
Sodium: 137 mmol/L (ref 135–145)

## 2019-11-28 LAB — CULTURE, BLOOD (ROUTINE X 2)
Culture: NO GROWTH
Culture: NO GROWTH
Special Requests: ADEQUATE
Special Requests: ADEQUATE

## 2019-11-28 LAB — CBC
HCT: 36.7 % (ref 36.0–46.0)
Hemoglobin: 11.1 g/dL — ABNORMAL LOW (ref 12.0–15.0)
MCH: 26.7 pg (ref 26.0–34.0)
MCHC: 30.2 g/dL (ref 30.0–36.0)
MCV: 88.4 fL (ref 80.0–100.0)
Platelets: 358 10*3/uL (ref 150–400)
RBC: 4.15 MIL/uL (ref 3.87–5.11)
RDW: 15.1 % (ref 11.5–15.5)
WBC: 15.6 10*3/uL — ABNORMAL HIGH (ref 4.0–10.5)
nRBC: 0 % (ref 0.0–0.2)

## 2019-11-28 MED ORDER — BUSPIRONE HCL 7.5 MG PO TABS: 7.5000 mg | ORAL_TABLET | Freq: Three times a day (TID) | ORAL | 0 refills | Status: DC

## 2019-11-28 MED ORDER — DOXYCYCLINE HYCLATE 100 MG PO TABS: 100.0000 mg | ORAL_TABLET | Freq: Two times a day (BID) | ORAL | 0 refills | Status: AC

## 2019-11-28 MED ORDER — LORAZEPAM 0.5 MG PO TABS: 0.5000 mg | ORAL_TABLET | Freq: Two times a day (BID) | ORAL | 0 refills | Status: DC | PRN

## 2019-11-28 MED ORDER — LOPERAMIDE HCL 2 MG PO CAPS
2.0000 mg | ORAL_CAPSULE | Freq: Once | ORAL | Status: AC
  Administered 2019-11-28: 07:00:00 2 mg via ORAL
  Filled 2019-11-28: qty 1

## 2019-11-28 MED ORDER — CEFDINIR 300 MG PO CAPS: 300.0000 mg | ORAL_CAPSULE | Freq: Two times a day (BID) | ORAL | 0 refills | Status: AC

## 2019-11-28 MED ORDER — AMLODIPINE BESYLATE 5 MG PO TABS: 10.0000 mg | ORAL_TABLET | Freq: Every day | ORAL | 0 refills | Status: DC

## 2019-11-28 MED ORDER — MELATONIN 3 MG PO TABS: 3.0000 mg | ORAL_TABLET | Freq: Every evening | ORAL | 0 refills | Status: DC | PRN

## 2019-11-28 MED ORDER — DULOXETINE HCL 40 MG PO CPEP: 40.0000 mg | ORAL_CAPSULE | Freq: Every day | ORAL | 0 refills | Status: DC

## 2019-11-28 MED ORDER — METOPROLOL TARTRATE 25 MG PO TABS: 12.5000 mg | ORAL_TABLET | Freq: Two times a day (BID) | ORAL | 0 refills | Status: DC

## 2019-11-28 NOTE — Plan of Care (Signed)
  Problem: Education: Goal: Knowledge of General Education information will improve Description: Including pain rating scale, medication(s)/side effects and non-pharmacologic comfort measures Outcome: Adequate for Discharge   Problem: Health Behavior/Discharge Planning: Goal: Ability to manage health-related needs will improve Outcome: Adequate for Discharge   Problem: Clinical Measurements: Goal: Ability to maintain clinical measurements within normal limits will improve Outcome: Adequate for Discharge Goal: Will remain free from infection Outcome: Adequate for Discharge Goal: Diagnostic test results will improve Outcome: Adequate for Discharge Goal: Respiratory complications will improve Outcome: Adequate for Discharge Goal: Cardiovascular complication will be avoided Outcome: Adequate for Discharge   Problem: Activity: Goal: Risk for activity intolerance will decrease Outcome: Adequate for Discharge   Problem: Nutrition: Goal: Adequate nutrition will be maintained Outcome: Adequate for Discharge   Problem: Coping: Goal: Level of anxiety will decrease Outcome: Adequate for Discharge   Problem: Elimination: Goal: Will not experience complications related to bowel motility Outcome: Adequate for Discharge Goal: Will not experience complications related to urinary retention Outcome: Adequate for Discharge   Problem: Pain Managment: Goal: General experience of comfort will improve Outcome: Adequate for Discharge   Problem: Safety: Goal: Ability to remain free from injury will improve Outcome: Adequate for Discharge   Problem: Skin Integrity: Goal: Risk for impaired skin integrity will decrease Outcome: Adequate for Discharge   Problem: Acute Rehab PT Goals(only PT should resolve) Goal: Pt Will Go Supine/Side To Sit Outcome: Adequate for Discharge Goal: Patient Will Transfer Sit To/From Stand Outcome: Adequate for Discharge Goal: Pt Will Ambulate Outcome: Adequate  for Discharge Goal: Pt Will Go Up/Down Stairs Outcome: Adequate for Discharge Goal: Pt/caregiver will Perform Home Exercise Program Outcome: Adequate for Discharge   Problem: Acute Rehab OT Goals (only OT should resolve) Goal: Pt. Will Perform Tub/Shower Transfer Outcome: Adequate for Discharge Goal: OT Additional ADL Goal #1 Outcome: Adequate for Discharge

## 2019-11-28 NOTE — Progress Notes (Signed)
Pt got discharged to home, discharge instructions provided and patient showed understanding to it, IV taken out,Telemonitor DC,pt left unit in wheelchair with all of the belongings accompanied with a family member (Son)  Goodwin Kamphaus, RN 

## 2019-11-28 NOTE — TOC Transition Note (Signed)
Transition of Care [facility]) - CM/SW Discharge Note   Patient Details  Name: Martha Mora MRN: [id number] Date of Birth: 06-Aug-1946  [facility]) CM/SW Contact:  Carles Collet, RN Phone Number: 11/28/2019, 12:20 PM   Clinical Narrative:    I spoke w patient and verified that home oxygen has been delivered to the room. She understands to call [employer] when she gets home to have them set up her home unit, and to keep the POC unit plugged in. [facility] services have been set up with [facility].     Final next level of care: Home/Self Care Barriers to Discharge: Continued Medical Work up   Patient Goals and CMS Choice Patient states their goals for this hospitalization and ongoing recovery are:: feel better , sleep better and get back to where she should be CMS Medicare.gov Compare Post Acute Care list provided to:: Patient Choice offered to / list presented to : Patient  Discharge Placement                       Discharge Plan and Services In-house Referral: NA Discharge Planning Services: CM Consult Post Acute Care Choice: Durable Medical Equipment          DME Arranged: Oxygen DME Agency: [employer] Date DME Agency Contacted: 11/26/19 Time DME Agency Contacted: 1330 Representative spoke with at DME Agency: [place]: NA [place]: Frank Date [place]: 11/28/19 Time [place]: 1220 Representative spoke with at [place]: [place] ([place]) Interventions     Readmission Risk Interventions Readmission Risk Prevention Plan 11/26/2019  Transportation Screening Complete  PCP or Specialist Appt within 3-5 Days Complete  HRI or [place] Complete  Social Work Consult for [place] Planning/Counseling Complete  Palliative Care Screening Not Applicable  Medication Review Press photographer) Complete  Some recent data might be hidden

## 2019-11-28 NOTE — Telephone Encounter (Signed)
FPTS After Hours Note  Martha Mora called the after-hours line stating that she is having an anxiety attack after being discharged from the hospital earlier today.  She states that she tends to get these anxiety attacks at nighttime because she cannot sleep.  She says that her anxiety medications were changed during her hospitalization, and she does not know what to do for her anxiety.  I counseled [name] on deep breathing techniques and reassuring phrases that she can tell herself to reduce her anxiety.  Since I have been on nights when [name] has had these anxiety attacks.  I have seen that Ativan is the only non-QTC prolonging medication that has worked.  Therefore, I will prescribe Ativan 0.5 mg nightly as needed.  It appeared that patient responded to the 1 mg dose better than the 0.5 mg while inpatient, so I counseled patient to take 0.5 mg, and if she continues to have uncontrolled anxiety 4 hours later, she can take 1-2 more tablets.  I have given her enough of this medication to last her to her appointment with [name] on 1/14, when her anxiety can be reassessed.  Will forward to [name] as an Juluis Rainier.  Natanya Holecek C. Shan Levans, MD PGY-3, [facility] 11/28/2019 11:03 PM

## 2019-11-28 NOTE — Discharge Instructions (Signed)
You were hospitalized at [facility] due to shortness of breath.  We expect this is from pneumonia which improved after antibiotics. We are so glad you are feeling better.  Be sure to follow-up with your family physician on Thursday at 9:50 AM.  Thank you for allowing [place] to take care of you.  - Stop by the pharmacy to pick up your new prescriptions  - We stopped several medications during your hospitalization: Lyrica, trazadone, Robaxin, Meclizine and Celexa. We have asked [name] to restart some of these medications as needed during your hospital follow up visit with her on Thursday.  - Celexa was replaced by Cymbalta.  - For anxiety we added on a medication called Buspar.  - For sleep you were started on Melatonin and Trazodone was held.  - Your blood pressures where elevated during your hospitalization and we increased your amlodipine to 10 mg daily.  If you become increasingly short of breath, have chest pain or fever >102 F please seek health care immediately.    Take care, [facility] family medicine team

## 2019-11-28 NOTE — Progress Notes (Signed)
Pt just informed me she had 7 to 8 loose dark stools during night.  Would like to try imodium.  Pt  walks to bathroom by self.  Educated pt the need to let staff know.  Notified Md.  Will continue to monitor. Saunders Revel T

## 2019-11-28 NOTE — Progress Notes (Signed)
Brief note: I will cosign resident's note once it is done.   The patient did well overnight. She denies hemoptysis or nasal bleed. She had a bout of diarrhea overnight, which was not reported to nursing till this morning. She stated that this is chronic, and she uses Imodium prn at home. Imodium given this AM.  CAP: She is doing well on her current A/B regimen. She saturates well on RA at 93-94%. She, however, requires O2 with ambulation. She can be d/c home with Oxygen. She will benefit from outpatient PFT and Pulm eval. Her last PFT was normal in 2018. HTN: BP improved on the current regimen. Outpatient management her recommended.

## 2019-11-29 ENCOUNTER — Telehealth: Payer: Self-pay | Admitting: Family Medicine

## 2019-11-29 DIAGNOSIS — J449 Chronic obstructive pulmonary disease, unspecified: Secondary | ICD-10-CM | POA: Diagnosis not present

## 2019-11-29 NOTE — Telephone Encounter (Signed)
Agree 

## 2019-11-29 NOTE — Telephone Encounter (Signed)
FPTS After Hours Note  Ms. Merwin calls the after hours line for a second time to report that she continues to be anxious and have significant restless leg symptoms.  She has already taken 3 tablets of the 0.5 mg Ativan.  I counseled her that her symptoms are not dangerous or new, and she needs to tell herself this rather than allowing it to make her anxiety worse.  She was previously taking Lyrica for her RLS, which was stopped due to her prolonged QT syndrome.  She is allergic to Requip.  Since Ms. Czarniak was on multiple medications that could cause prolonged QT (Celexa, Trazodone, Lyrica) and has been taken off of all of them, I told her that is it okay to take one tablet of Lyrica tonight to help reduce her symptoms.  She can discuss whether the benefits of this medication outweigh the risks with her PCP at her upcoming appointment.  Will forward to PCP.  Melisha Eggleton C. Shan Levans, MD PGY-3, [facility] 11/29/2019 2:13 AM

## 2019-12-01 LAB — GLUCOSE, CAPILLARY: Glucose-Capillary: 112 mg/dL — ABNORMAL HIGH (ref 70–99)

## 2019-12-02 ENCOUNTER — Other Ambulatory Visit: Payer: Self-pay

## 2019-12-02 ENCOUNTER — Encounter: Payer: Self-pay | Admitting: Family Medicine

## 2019-12-02 ENCOUNTER — Ambulatory Visit (INDEPENDENT_AMBULATORY_CARE_PROVIDER_SITE_OTHER): Payer: Medicare Other | Admitting: Family Medicine

## 2019-12-02 VITALS — BP 144/60 | HR 64 | Wt 228.0 lb

## 2019-12-02 DIAGNOSIS — F419 Anxiety disorder, unspecified: Secondary | ICD-10-CM

## 2019-12-02 DIAGNOSIS — Z9981 Dependence on supplemental oxygen: Secondary | ICD-10-CM

## 2019-12-02 DIAGNOSIS — J189 Pneumonia, unspecified organism: Secondary | ICD-10-CM | POA: Diagnosis not present

## 2019-12-02 MED ORDER — DULOXETINE HCL 60 MG PO CPEP: 60.0000 mg | ORAL_CAPSULE | Freq: Every day | ORAL | 0 refills | Status: DC

## 2019-12-02 MED ORDER — BUSPIRONE HCL 10 MG PO TABS: 10.0000 mg | ORAL_TABLET | Freq: Three times a day (TID) | ORAL | 0 refills | Status: DC

## 2019-12-02 NOTE — Assessment & Plan Note (Signed)
Improving and now s/p antibiotics. On 2L supplemental O2 prn. Suspect can wean off completely once more fully healed. Would benefit from eventual PFTs and possible CPAP titration once more healed.

## 2019-12-02 NOTE — Progress Notes (Signed)
Subjective:   Patient ID: Martha Mora    DOB: [birth year], 74 y.o. female   MRN: [id number]  Martha Mora is a 74 y.o. female with a history of HTN, CAD, OSA, GERD, diverticulosis, IBS, osteopenia, h/o breast cancer, HLD, obesity, anxiety/depression, RLS, mixed urinary incontinence, insomnia here for   Hospital follow-up -Admitted 1/4-1/10 for acute respiratory failure secondary to CAP and rhinovirus, also with delirium.  Initially requiring up to 15 L via NRB then subsequently intubated due to somnolence from anxiolytics in the ED. -Completed antibiotic therapy with doxycycline and cefdinir on 1/11. -Tolerating home oxygen, using 2L prn.  Anxiety -With significant anxiety in ED during requiring Versed, Ativan, Haldol and Precedex once transferred to the ICU.  Transitioned to Ativan as needed while hospitalized.  Celexa changed to Cymbalta due to QT prolongation.  Started on BuSpar. Tolerating med changes.  - States being alone is the worse. Son lives a few houses down but currently living with her since discharge. - Taking lorazepam at night for sleep. - Coping mechanisms: lorazepam. Does enjoy watching TV, has tried deep breathing in the past.   Review of Systems:  Per HPI.  Medications and smoking status reviewed.  Objective:   BP (!) 144/60   Pulse 64   Wt 228 lb (103.4 kg)   SpO2 97%   BMI 43.08 kg/m  Vitals and nursing note reviewed.  General: obese female, in no acute distress with non-toxic appearance CV: regular rate and rhythm without murmurs, rubs, or gallops Lungs: clear to auscultation bilaterally with normal work of breathing on 2L, appropriately saturated. Extremities: warm and well perfused, normal tone MSK: ROM grossly intact, gait normal Neuro: Alert and oriented, speech normal Psych: appropriately dressed and well groomed. No flight of ideas or tangential thought process.  GAD 7 : Generalized Anxiety Score 12/02/2019 08/31/2019 04/28/2019  Nervous,  Anxious, on Edge 3 3 3   Control/stop worrying 2 2 0  Worry too much - different things 2 2 0  Trouble relaxing 3 3 3   Restless 3 2 3   Easily annoyed or irritable 2 1 3   Afraid - awful might happen 2 1 0  Total GAD 7 Score 17 14 12   Anxiety Difficulty Very difficult Very difficult Not difficult at all    Assessment & Plan:   Anxiety Subjectively better since d/c from hospital but still troublesome, GAD 17 today. Will increase cymbalta and buspar given good toleration. Strongly advised patient to establish with counseling, resources given. Advised to focus on non-pharmacologic coping mechanisms with lorazepam as last resort with eventual taper off once better controlled with long term medications. Patient verbalized understanding. Follow up in 2 weeks.  CAP (community acquired pneumonia) Improving and now s/p antibiotics. On 2L supplemental O2 prn. Suspect can wean off completely once more fully healed. Would benefit from eventual PFTs and possible CPAP titration once more healed.  Orders Placed This Encounter  Procedures  . Pulmonary function test    Order Specific Question:   Where should this test be performed?    Answer:   [facility]    Order Specific Question:   Full PFT: includes the following: basic spirometry, spirometry pre & post bronchodilator, diffusion capacity (DLCO), lung volumes    Answer:   Full PFT   Meds ordered this encounter  Medications  . busPIRone (BUSPAR) 10 MG tablet    Sig: Take 1 tablet (10 mg total) by mouth 3 (three) times daily.    Dispense:  90 tablet  Refill:  0  . DULoxetine (CYMBALTA) 60 MG capsule    Sig: Take 1 capsule (60 mg total) by mouth daily.    Dispense:  90 capsule    Refill:  0    [name], DO PGY-3, [facility] 12/02/2019 3:27 PM

## 2019-12-02 NOTE — Assessment & Plan Note (Addendum)
Subjectively better since d/c from hospital but still troublesome, GAD 17 today. Will increase cymbalta and buspar given good toleration. Strongly advised patient to establish with counseling, resources given. Advised to focus on non-pharmacologic coping mechanisms with lorazepam as last resort with eventual taper off once better controlled with long term medications. Patient verbalized understanding. Follow up in 2 weeks.

## 2019-12-02 NOTE — Patient Instructions (Addendum)
It was great to see you!  Our plans for today:  -We are increasing the dose of your anxiety medications, new prescriptions were sent to the pharmacy. -Try to use calming techniques such as deep breathing or watching TV to help lower your anxiety.  Only use lorazepam as a last resort.  Try not to watch TV late at night as this can make it difficult to sleep. -We are ordering lung function testing.  Someone will give you a call about this appointment. -See below for counseling resources.  You should call to make an appointment. -Come back in 2 weeks.  Take care and seek immediate care sooner if you develop any concerns.   Dr. [facility]   Therapy and Counseling Resources Most providers on this list will take Medicaid. Patients with commercial insurance or Medicare should contact their insurance company to get a list of in network providers.  [facility] [address], [place], [place] [address]       [phone]     [facility] [address] [place], [place]    Jinny Blossom [facility] [address] [place], [place]  [facility]:  [place]. [place]  [facility]:  [place] [address]oni Muse, [place]  [facility] (under & uninsured) [address], [place] [phone]    [email or web address]    [facility] [place]     Phone:  [phone]     [place]  [place] [address]      [place], [place] ext [place]: [place], [place], [place]   [place] ([place] therapist) [address] [address]   [place] [address]    [phone]    [facility]   [address],  [place], [place]   [place]  [place]  [facility]  [address] [place], [place]        [phone]  Open Access/Walk In Clinic under & uninsured [place],  [address], [place] [phone]):  Mon - Fri from 8 AM - 3 PM  [facility] [place],  ([place])   [place], [place]: [phone]) 8:30 - 12; 1 - 2:30  Family [facility] of the [place],  [place], [place]    ([phone]):8:30 - 12; 2 - 3PM  [facility] [employer],  [address],  [place]; [phone]):   Mon - Fri 8 AM - 5 PM  [facility] [place]  MWF 12:30 to 3:00 or call to schedule an appointment  [phone]  Specific Provider options Psychology Today  [email or web address] 1. click on find a therapist  2. enter your zip code 3. left side and select or tailor a therapist for your specific need.   [facility] Provider Directory [email or web address]  (Medicaid)   Follow all drop down to find a provider  [place] or [email or web address] [address], [place], [place] Recovery support and educational   In home counseling [place] Telephone: [phone]  office  in [place] [email or web address]   Does not take reg. Medicaid or Medicare private insurance [facility], [place] health Choice, [facility], [place], [place], [place], [place] Health Choice  24- Hour Availability:  . [place] or [phone]  . Family Service of the [employer] [phone]  [facility] Crisis Service  [phone]   . [place]  [phone] (after hours)  . Therapeutic Alternative/Mobile Crisis   [phone]  . [place] National Suicide Hotline  (801)002-2041  ([place])  . Call 911 or go to emergency room  . [employer]  [phone]);  [place] and [employer]   . [facility] ACCESS  [phone]); [place], [place], [place], [place], [place], [place], [place]

## 2019-12-13 ENCOUNTER — Encounter (INDEPENDENT_AMBULATORY_CARE_PROVIDER_SITE_OTHER): Payer: Medicare Other | Admitting: Ophthalmology

## 2019-12-13 DIAGNOSIS — H33302 Unspecified retinal break, left eye: Secondary | ICD-10-CM

## 2019-12-13 DIAGNOSIS — I10 Essential (primary) hypertension: Secondary | ICD-10-CM

## 2019-12-13 DIAGNOSIS — H35033 Hypertensive retinopathy, bilateral: Secondary | ICD-10-CM

## 2019-12-13 DIAGNOSIS — H43813 Vitreous degeneration, bilateral: Secondary | ICD-10-CM

## 2019-12-13 DIAGNOSIS — H353231 Exudative age-related macular degeneration, bilateral, with active choroidal neovascularization: Secondary | ICD-10-CM | POA: Diagnosis not present

## 2019-12-15 ENCOUNTER — Other Ambulatory Visit: Payer: Self-pay | Admitting: Family Medicine

## 2019-12-16 ENCOUNTER — Telehealth (INDEPENDENT_AMBULATORY_CARE_PROVIDER_SITE_OTHER): Payer: Medicare Other | Admitting: Family Medicine

## 2019-12-16 DIAGNOSIS — F419 Anxiety disorder, unspecified: Secondary | ICD-10-CM | POA: Diagnosis not present

## 2019-12-16 DIAGNOSIS — G4700 Insomnia, unspecified: Secondary | ICD-10-CM

## 2019-12-16 NOTE — Assessment & Plan Note (Signed)
Anxiety impacting sleep: Patient is safe and has been asking for Ativan, I discussed reasons why do not think that is appropriate to start as a long-term medication and suggested instead starting therapy with psychology which she agrees to referral.  Referral placed

## 2019-12-16 NOTE — Progress Notes (Signed)
[facility] Tele[facility]icine Visit  Patient consented to have virtual visit. Method of visit: Telephone  Encounter participants: Patient: Martha Mora - located at home Provider: Sherene Sires - located at home telemed Others (if applicable): n/a  Chief Complaint: Anxiety and insomnia  HPI: Patient claiming that she is only sleeping "an hour per night tops ".  There is been no new triggering event that she attributes this to her anxiety, she says that she just stays awake all night long and cannot calm down.  She describes no specific fear but says that when she was on Ativan that she thought that worked better for her.  She was recently taken off of it and that her Cymbalta increased.  She would like to go back to the Ativan but has had no specific problems with the Cymbalta itself.  She denies SI HI and says that she has never sought therapy for this.  ROS: per HPI  Pertinent PMHx: Anxiety and insomnia  Exam:  Respiratory: Speaking in clear full sentences with no respiratory distress over the phone.  No visual examination was possible as we were not on video, she was answering questions appropriately  Assessment/Plan:  Anxiety Anxiety impacting sleep: Patient is safe and has been asking for Ativan, I discussed reasons why do not think that is appropriate to start as a long-term medication and suggested instead starting therapy with psychology which she agrees to referral.  Referral placed  Insomnia Patient says she is only sleeping 1 hour per night, she believes this is due to her anxiety.  She would like to get Ativan for this because she said it helped her sleep in the past.  I discussed the reasons I do not think Ativan is an appropriate long-term medication for me to start for her, she did except a referral to psychology as I believe once her anxiety is more controlled she should sleep better.    Time spent during visit with patient: 12 minutes

## 2019-12-16 NOTE — Assessment & Plan Note (Signed)
Patient says she is only sleeping 1 hour per night, she believes this is due to her anxiety.  She would like to get Ativan for this because she said it helped her sleep in the past.  I discussed the reasons I do not think Ativan is an appropriate long-term medication for me to start for her, she did except a referral to psychology as I believe once her anxiety is more controlled she should sleep better.

## 2019-12-17 ENCOUNTER — Telehealth: Payer: Self-pay

## 2019-12-17 NOTE — Telephone Encounter (Signed)
Patient called nurse line to inform [name] that she took trazadone last night to sleep. Per patient, she had not been taking medication but would like to get refill, as it helped her sleep last night.   To PCP  Please advise  Talbot Grumbling, RN

## 2019-12-21 ENCOUNTER — Other Ambulatory Visit: Payer: Self-pay | Admitting: Family Medicine

## 2019-12-21 ENCOUNTER — Telehealth: Payer: Self-pay

## 2019-12-21 MED ORDER — TRAZODONE HCL 50 MG PO TABS: 50.0000 mg | ORAL_TABLET | Freq: Every evening | ORAL | 0 refills | Status: DC | PRN

## 2019-12-21 NOTE — Telephone Encounter (Signed)
Refill provided

## 2019-12-21 NOTE — Telephone Encounter (Signed)
Pt calls nurse line requesting current medication list be sent to address. Patient does not understand how to use myChart. Advised patient that I would print out a copy for her and that she could pick up from the front desk at her convenience.   Talbot Grumbling, RN

## 2019-12-24 ENCOUNTER — Other Ambulatory Visit: Payer: Self-pay | Admitting: Family Medicine

## 2019-12-27 ENCOUNTER — Other Ambulatory Visit: Payer: Self-pay | Admitting: *Deleted

## 2019-12-27 NOTE — Telephone Encounter (Signed)
Called patient on 12/27/2019 at 5:45 PM and left HIPAA-compliant VM with instructions to call Family Medicine clinic back   Plan to discuss if patient has discontinued omeprazole and started famotidine as instructed during last telephone call on 10/31/2020. Will await for patient to return call.   Drexel Iha, PharmD PGY2 Ambulatory Care Pharmacy Resident

## 2019-12-28 ENCOUNTER — Ambulatory Visit: Payer: Medicare Other

## 2019-12-28 ENCOUNTER — Other Ambulatory Visit: Payer: Medicare Other

## 2019-12-30 DIAGNOSIS — J449 Chronic obstructive pulmonary disease, unspecified: Secondary | ICD-10-CM | POA: Diagnosis not present

## 2020-01-10 ENCOUNTER — Encounter (INDEPENDENT_AMBULATORY_CARE_PROVIDER_SITE_OTHER): Payer: Medicare Other | Admitting: Ophthalmology

## 2020-01-12 ENCOUNTER — Encounter (INDEPENDENT_AMBULATORY_CARE_PROVIDER_SITE_OTHER): Payer: Medicare Other | Admitting: Ophthalmology

## 2020-01-12 DIAGNOSIS — H43813 Vitreous degeneration, bilateral: Secondary | ICD-10-CM

## 2020-01-12 DIAGNOSIS — H34211 Partial retinal artery occlusion, right eye: Secondary | ICD-10-CM | POA: Diagnosis not present

## 2020-01-12 DIAGNOSIS — I10 Essential (primary) hypertension: Secondary | ICD-10-CM | POA: Diagnosis not present

## 2020-01-12 DIAGNOSIS — H35033 Hypertensive retinopathy, bilateral: Secondary | ICD-10-CM | POA: Diagnosis not present

## 2020-01-12 DIAGNOSIS — H353231 Exudative age-related macular degeneration, bilateral, with active choroidal neovascularization: Secondary | ICD-10-CM | POA: Diagnosis not present

## 2020-01-24 ENCOUNTER — Telehealth: Payer: Self-pay

## 2020-01-24 NOTE — Telephone Encounter (Signed)
Patient calls nurse line regarding malodorous urine. Patient denies any other symptoms, however, reports that her last UTI her only symptom was odor with urination.   Scheduled office visit with PCP on Wednesday 01/26/20.   To PCP  Talbot Grumbling, RN

## 2020-01-26 ENCOUNTER — Other Ambulatory Visit: Payer: Self-pay

## 2020-01-26 ENCOUNTER — Encounter: Payer: Self-pay | Admitting: Family Medicine

## 2020-01-26 ENCOUNTER — Ambulatory Visit (INDEPENDENT_AMBULATORY_CARE_PROVIDER_SITE_OTHER): Payer: Medicare Other | Admitting: Family Medicine

## 2020-01-26 VITALS — BP 128/60 | HR 69

## 2020-01-26 DIAGNOSIS — R8290 Unspecified abnormal findings in urine: Secondary | ICD-10-CM

## 2020-01-26 DIAGNOSIS — F419 Anxiety disorder, unspecified: Secondary | ICD-10-CM | POA: Diagnosis not present

## 2020-01-26 LAB — POCT URINALYSIS DIP (MANUAL ENTRY)
Bilirubin, UA: NEGATIVE
Glucose, UA: NEGATIVE mg/dL
Ketones, POC UA: NEGATIVE mg/dL
Leukocytes, UA: NEGATIVE
Nitrite, UA: NEGATIVE
Protein Ur, POC: NEGATIVE mg/dL
Spec Grav, UA: 1.02 (ref 1.010–1.025)
Urobilinogen, UA: 0.2 E.U./dL
pH, UA: 5.5 (ref 5.0–8.0)

## 2020-01-26 LAB — POCT UA - MICROSCOPIC ONLY

## 2020-01-26 NOTE — Patient Instructions (Addendum)
It was great to see you!  Our plans for today:  - Your urine does not show signs of infection. We are sending your urine for culture and will call you with the results.  - Call [place] at [phone] to get set up with an appointment. They can likely set you up with a virtual visit. - Come back in 3 months.  Take care and seek immediate care sooner if you develop any concerns.   Dr. [facility]

## 2020-01-26 NOTE — Progress Notes (Signed)
    SUBJECTIVE:   Malodorous urine - has noticed odor to urine for about 2-3 months. Is concerned she has a UTI as she did not have symptoms other than odor prior to her urosepsis in 02/2019. This was her last UTI. - last urine cultures 11/23/19 and 06/30/19 with no growth. - drinking 4 c water per day, 2 c decaf soda, 2 c decaf coffee.  - has appt with urology in June - no hematuria, abd pain, increased urinary frequency, fever, abnl vaginal discharge, oral ulcers - has h/o mixed incontinence, not more recently.  - not sexually active  Anxiety - doing better with previously discussed coping strategies. Worse with enclosed places. Hasn't called behavioral medicine yet.  PERTINENT  PMH / PSH: HTN, CAD, internal hemorrhoids, OSA, GERD, diverticulosis, IBS, OA, osteopenia, AUB, h/o breast cancer, obesity, HLD, anxiety/depression, tobacco use, mixed urinary incontinence, RLS  OBJECTIVE:   BP 128/60   Pulse 69   SpO2 94%   Gen: overweight, elderly female, in NAD Cardiac: regular rate Resp: normal WOB on RA  ASSESSMENT/PLAN:   Malodorous urine UA not consistent with infection. Will send urine for culture given prior h/o urosepsis with lack of typical symptoms. Not currently sexually active and no concern for STI. Will call with results of culture and treat as indicated. Recommended increasing water consumption as odor may result from concentrated urine.  Anxiety Doing better although has not yet established with counseling.  Encouraged to call [facility] [place] back to set up. Follow up in a few months.   [name], [place]

## 2020-01-27 DIAGNOSIS — J449 Chronic obstructive pulmonary disease, unspecified: Secondary | ICD-10-CM | POA: Diagnosis not present

## 2020-01-28 LAB — URINE CULTURE

## 2020-01-28 NOTE — Assessment & Plan Note (Signed)
UA not consistent with infection. Will send urine for culture given prior h/o urosepsis with lack of typical symptoms. Not currently sexually active and no concern for STI. Will call with results of culture and treat as indicated. Recommended increasing water consumption as odor may result from concentrated urine.

## 2020-01-28 NOTE — Assessment & Plan Note (Signed)
Doing better although has not yet established with counseling.  Encouraged to call [place] back to set up. Follow up in a few months.

## 2020-02-07 ENCOUNTER — Inpatient Hospital Stay: Admission: RE | Admit: 2020-02-07 | Payer: Medicare Other | Source: Ambulatory Visit

## 2020-02-08 ENCOUNTER — Other Ambulatory Visit: Payer: Self-pay | Admitting: *Deleted

## 2020-02-09 ENCOUNTER — Encounter (INDEPENDENT_AMBULATORY_CARE_PROVIDER_SITE_OTHER): Payer: Medicare Other | Admitting: Ophthalmology

## 2020-02-11 NOTE — Telephone Encounter (Signed)
Attempted to reach pt. No answer. No VM set up. Will try Monday. Salvatore Marvel, CMA

## 2020-02-11 NOTE — Telephone Encounter (Signed)
Patient calls nurse line asking about Lyrica and why it was not refilled. Please advise.

## 2020-02-13 ENCOUNTER — Telehealth: Payer: Self-pay | Admitting: Student in an Organized Health Care Education/Training Program

## 2020-02-13 NOTE — Telephone Encounter (Signed)
[facility] After Hours Emergency Hours Line  Patient consented to have virtual visit. Method of visit: Telephone  Encounter participants:  Patient: Martha Mora - located at son's house Provider: Daisy Floro - located at [facility]  Others (if applicable): Patient's son  Chief Complaint: Difficulty sleeping  HPI: Patient called the after-hours emergency line with concerns for a panic attack.  "I am having a panic attack, I just have not been sleeping, my eye is twitching and I am exhausted".  Patient states she used to have Lyrica prescribed for her twice daily which she only takes once daily for restless leg syndrome, she is asking for a refill of her Lyrica to help her sleep.  Also prescribed trazodone which she sometimes takes at night for sleep.  Additionally, she is prescribed BuSpar 10 mg 3 times daily but has only taken it 1 time today.  Cymbalta 60 mg daily is also on the patient's medication list, the patient states she is not taking this.  She denies any chest pain, shortness of breath, headaches, vision changes.  Is speaking in complete sentences.  Patient is safe to herself and others.  ROS: per HPI  Pertinent PMHx: Anxiety  Exam:  Respiratory: Speaking complete sentences, comfortable work of breathing  Assessment/Plan: Anxiety: Acutely worsened by lack of sleep, or may be anxiety is preventing patient sleep?  Patient does not have Lyrica currently on her medication list, has not been prescribed this and couple of months.  As this is a controlled substance, I do not feel it is indicated for patient's current situation, I do not feel comfortable refilling this for patient.  Patient instructed to take her second dose of BuSpar this afternoon and to start taking the BuSpar as prescribed 3 times daily.  Additionally, patient would benefit from follow-up with her PCP to get her baseline anxiety more optimally controlled as patient admits to  not taking her medications as prescribed.  Time spent during visit with patient: 9:00 minutes   Milus Banister, [place], PGY-2

## 2020-02-14 DIAGNOSIS — M25551 Pain in right hip: Secondary | ICD-10-CM | POA: Diagnosis not present

## 2020-02-14 DIAGNOSIS — M791 Myalgia, unspecified site: Secondary | ICD-10-CM | POA: Diagnosis not present

## 2020-02-14 DIAGNOSIS — M47816 Spondylosis without myelopathy or radiculopathy, lumbar region: Secondary | ICD-10-CM | POA: Diagnosis not present

## 2020-02-14 NOTE — Telephone Encounter (Signed)
Made appt for 3/30. Salvatore Marvel, CMA

## 2020-02-15 ENCOUNTER — Encounter: Payer: Self-pay | Admitting: Family Medicine

## 2020-02-15 ENCOUNTER — Other Ambulatory Visit: Payer: Self-pay

## 2020-02-15 ENCOUNTER — Telehealth (INDEPENDENT_AMBULATORY_CARE_PROVIDER_SITE_OTHER): Payer: Medicare Other | Admitting: Family Medicine

## 2020-02-15 DIAGNOSIS — I4891 Unspecified atrial fibrillation: Secondary | ICD-10-CM | POA: Diagnosis not present

## 2020-02-15 DIAGNOSIS — G2581 Restless legs syndrome: Secondary | ICD-10-CM

## 2020-02-15 DIAGNOSIS — F419 Anxiety disorder, unspecified: Secondary | ICD-10-CM | POA: Diagnosis not present

## 2020-02-15 DIAGNOSIS — Z8679 Personal history of other diseases of the circulatory system: Secondary | ICD-10-CM

## 2020-02-15 HISTORY — DX: Personal history of other diseases of the circulatory system: Z86.79

## 2020-02-15 MED ORDER — PREGABALIN 150 MG PO CAPS: 150.0000 mg | ORAL_CAPSULE | Freq: Every day | ORAL | 0 refills | Status: DC

## 2020-02-15 MED ORDER — DULOXETINE HCL 60 MG PO CPEP: 60.0000 mg | ORAL_CAPSULE | Freq: Every day | ORAL | 0 refills | Status: DC

## 2020-02-15 NOTE — Progress Notes (Signed)
[place] Telemedicine Visit I connected with  Martha Mora on 02/15/20 by a video enabled telemedicine application and verified that I am speaking with the correct person using two identifiers.   I discussed the limitations of evaluation and management by telemedicine. The patient expressed understanding and agreed to proceed.   Patient consented to have virtual visit. Method of visit: Video  Encounter participants: Patient: Martha Mora - located at home Provider: Caroline More - located at [facility] Others (if applicable): none  Chief Complaint: lyrica questions   HPI:  Questions regarding lyrica Last seen on 08/31/19 with Dr. [place] Mora, at that time lyrica was increased to 150mg  bid. Using lyrica for restless leg syndrome. In the past patient required low dose ropinirole as well. CBC on 11/28/19 showing Hgb 11.1.   This is the only medication that helps restless leg syndrome. Was previously taking 150mg . Recently refused refills because she needed appointment. Was stopped during hospitalization, possibly for respiratory distress? Was taking it after she left the hospital, up until last week when it ran out.   Anxiety From PCP note on 12/02/19, patient was changed to cymbalta and buspar during hospitalization for QT prolongation. Had been doing well. Advised to establish with counseling. Planned on taper with lorazepam. Patient requests refill of her duloxetine, has not been on it and was not sure if she should be on it.   Afib Sees a cardiologist. Is having back surgery in April. Was told to stop Eliquis 3 days prior to procedure. Is wondering if this is ok to stop.   ROS: per HPI  Pertinent PMHx: HTN, mitral valve regurg, OSA, GERD, restless leg syndrome, anxiety   Exam:  Respiratory: speaking full sentences, no increased WOB Gen: awake and alert, NAD  Assessment/Plan:  Restless leg syndrome Patient presenting for questions regarding Lyrica.   It was discontinued at last refill by PCP who requested that patient has a visit before refill.  It appears that this was discontinued during hospitalization for possible respiratory distress.  I discussed with pharmacy team including Dr.Koval.  This medication has a low risk for decline in respiratory drive.  Given that patient has been taking this without any issues since discharge from the hospital we will plan to continue this.  Patient is currently taking 150 mg daily, was previously prescribed at twice daily dosing.  Since she is stable on daily dosing we will plan to prescribe it this way.  Follow-up with PCP in 1 month.  Strict return precautions given.  Anxiety Patient previously on duloxetine as well as BuSpar.  Has been doing well on this dose but has been out of duloxetine for some time now.  Was not sure if she should request a refill or not.  Given that patient was stable on this medication and PCP would like her on this I will refill this.  Advised to follow-up with PCP in 2 to 4 weeks.  Strict return precautions given.  Patient is also to follow-up with counseling which she previously talked to PCP about.  Atrial fibrillation [facility]) Patient reports being on Eliquis for her A. fib.  This was written in hospital progress notes as well.  Not on patient's problem list for some reason.  Will likely need to clarify this with her PCP.  Patient reports she sees a cardiologist.  I advised that she discuss with cardiologist regarding Eliquis discontinuation prior to surgery.  Patient will contact cardiologist.    Time spent during visit  with patient: 15 minutes

## 2020-02-15 NOTE — Assessment & Plan Note (Signed)
Patient reports being on Eliquis for her A. fib.  This was written in hospital progress notes as well.  Not on patient's problem list for some reason.  Will likely need to clarify this with her PCP.  Patient reports she sees a cardiologist.  I advised that she discuss with cardiologist regarding Eliquis discontinuation prior to surgery.  Patient will contact cardiologist.

## 2020-02-15 NOTE — Assessment & Plan Note (Signed)
Patient previously on duloxetine as well as BuSpar.  Has been doing well on this dose but has been out of duloxetine for some time now.  Was not sure if she should request a refill or not.  Given that patient was stable on this medication and PCP would like her on this I will refill this.  Advised to follow-up with PCP in 2 to 4 weeks.  Strict return precautions given.  Patient is also to follow-up with counseling which she previously talked to PCP about.

## 2020-02-15 NOTE — Assessment & Plan Note (Signed)
Patient presenting for questions regarding Lyrica.  It was discontinued at last refill by PCP who requested that patient has a visit before refill.  It appears that this was discontinued during hospitalization for possible respiratory distress.  I discussed with pharmacy team including Dr.Koval.  This medication has a low risk for decline in respiratory drive.  Given that patient has been taking this without any issues since discharge from the hospital we will plan to continue this.  Patient is currently taking 150 mg daily, was previously prescribed at twice daily dosing.  Since she is stable on daily dosing we will plan to prescribe it this way.  Follow-up with PCP in 1 month.  Strict return precautions given.

## 2020-02-19 IMAGING — DX PORTABLE CHEST - 1 VIEW
1 series · 1 of 1 positions shown · non-contrast
Comparison: 02/21/2019

CLINICAL DATA: Respiratory failure

EXAM:
PORTABLE CHEST 1 VIEW

[chest ap]
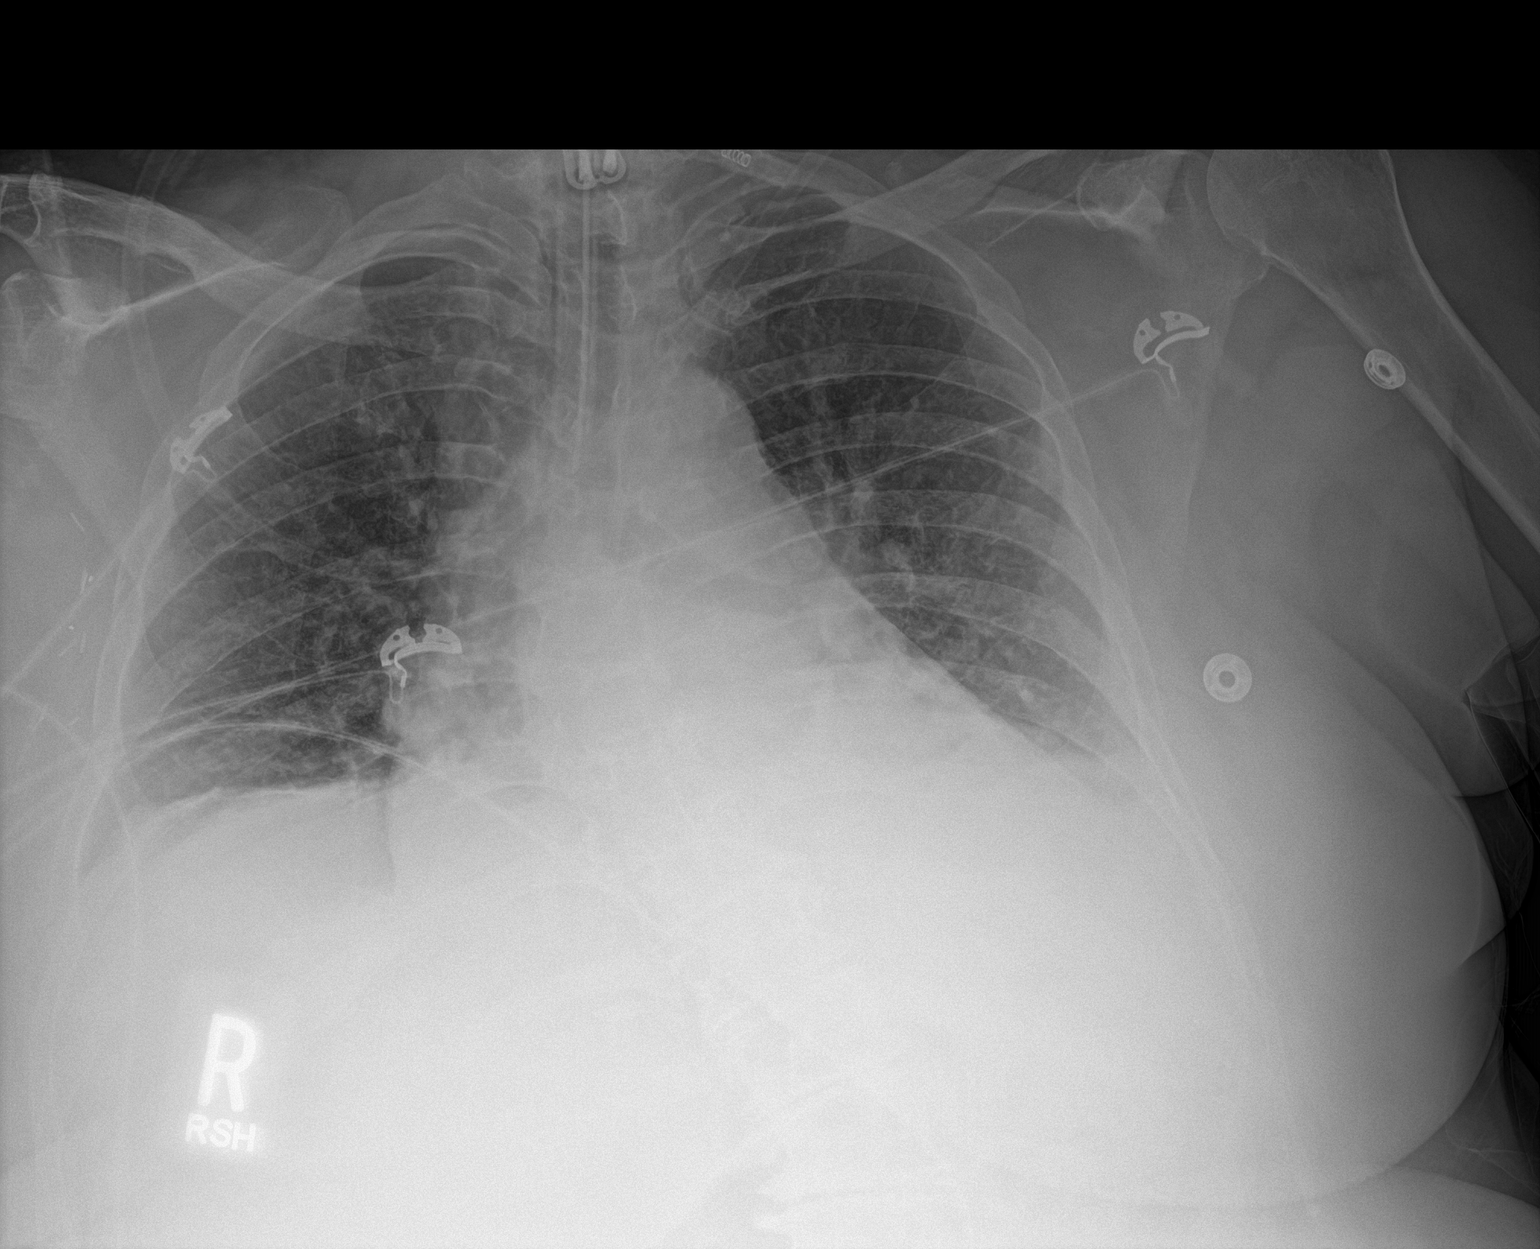

[1 of 1 positions shown; findings below may reference images not displayed]

FINDINGS: Endotracheal tube placed. Tip is 1.5 cm from the carina. Left
basilar consolidation is stable. Right lung is under aerated and
clear. No pneumothorax or pleural effusion.
IMPRESSION: Stable left basilar consolidation.

Endotracheal tube placed.

## 2020-02-25 IMAGING — DX PORTABLE CHEST - 1 VIEW
1 series · 1 of 1 positions shown · non-contrast
Comparison: 02/27/2019

CLINICAL DATA: Possible [id number].  Endotracheal tube

EXAM:
PORTABLE CHEST 1 VIEW

[chest ap]
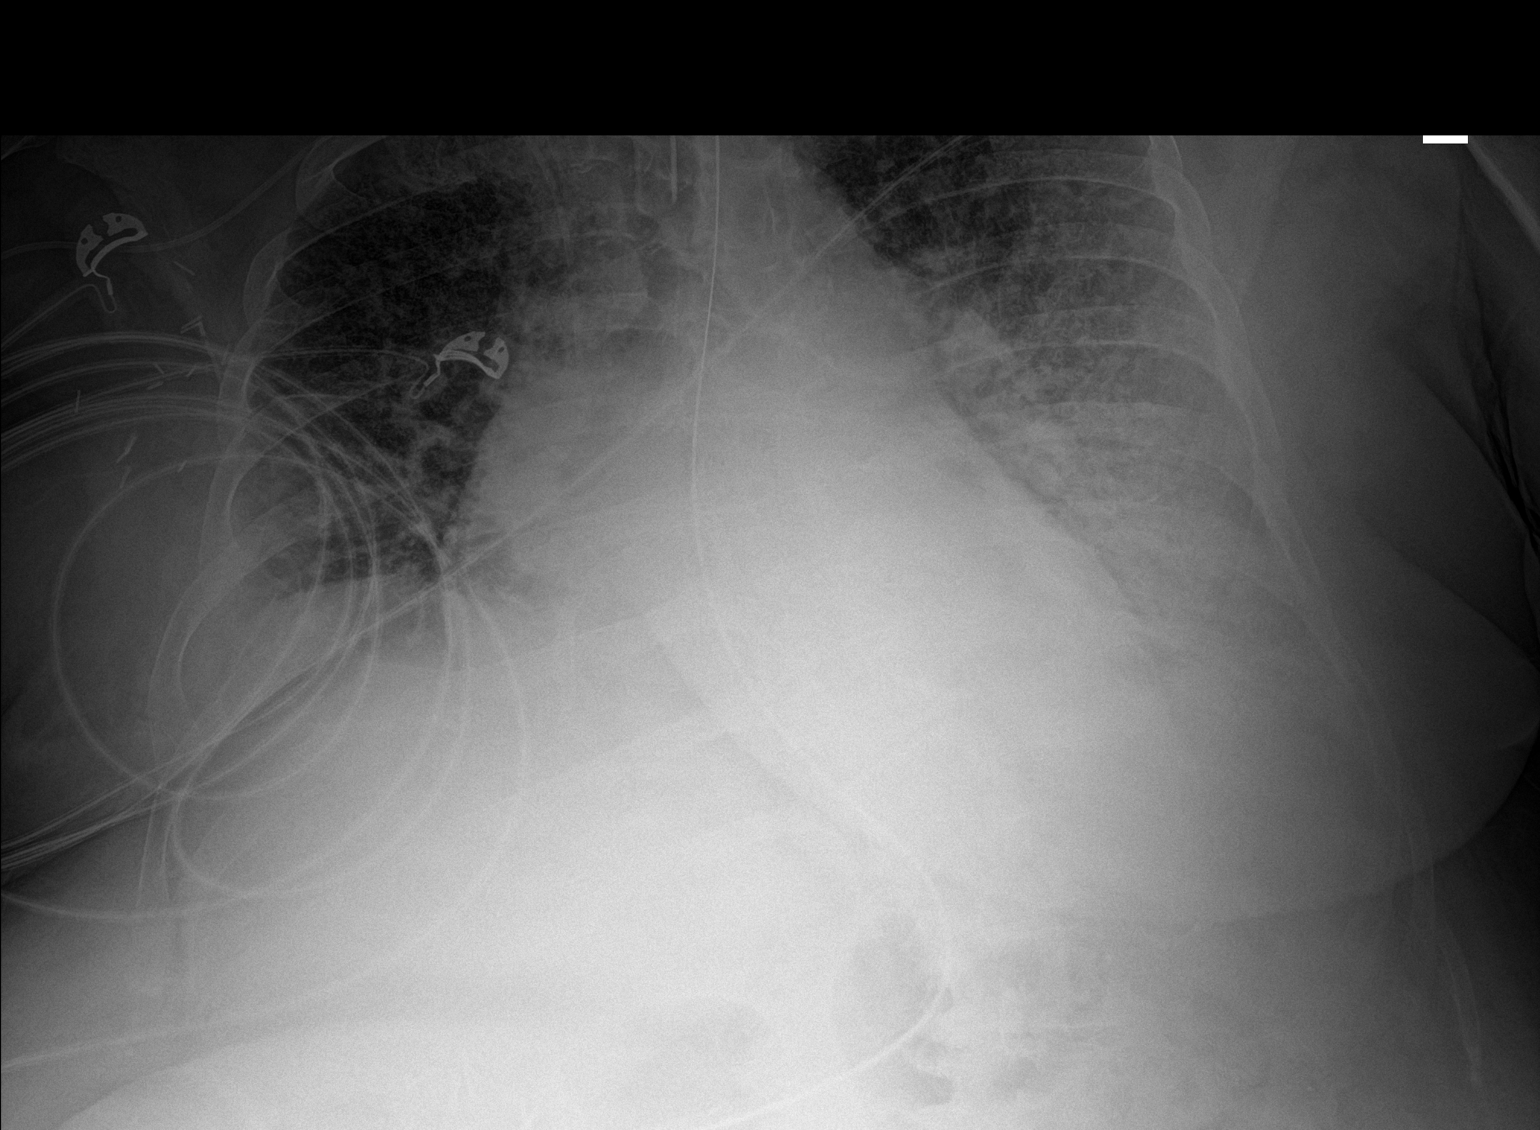

[1 of 1 positions shown; findings below may reference images not displayed]

FINDINGS: Endotracheal tube in good position. Right arm PICC tip in the SVC
unchanged. NG in the stomach

Cardiac enlargement. Progression of bilateral airspace disease
diffusely, left greater than right. Small bilateral effusions have
progressed. Progression of left lower lobe consolidation.
IMPRESSION: Significant progression of diffuse bilateral airspace disease left
greater than right which may represent pneumonia or edema.
Progression of bilateral effusions.

Support lines remain in good position and unchanged.

## 2020-02-27 DIAGNOSIS — J449 Chronic obstructive pulmonary disease, unspecified: Secondary | ICD-10-CM | POA: Diagnosis not present

## 2020-02-28 IMAGING — DX PORTABLE CHEST - 1 VIEW
1 series · 1 of 1 positions shown · non-contrast
Comparison: March 02, 2019

CLINICAL DATA: Endotracheal tube present.

EXAM:
PORTABLE CHEST 1 VIEW

[chest ap]
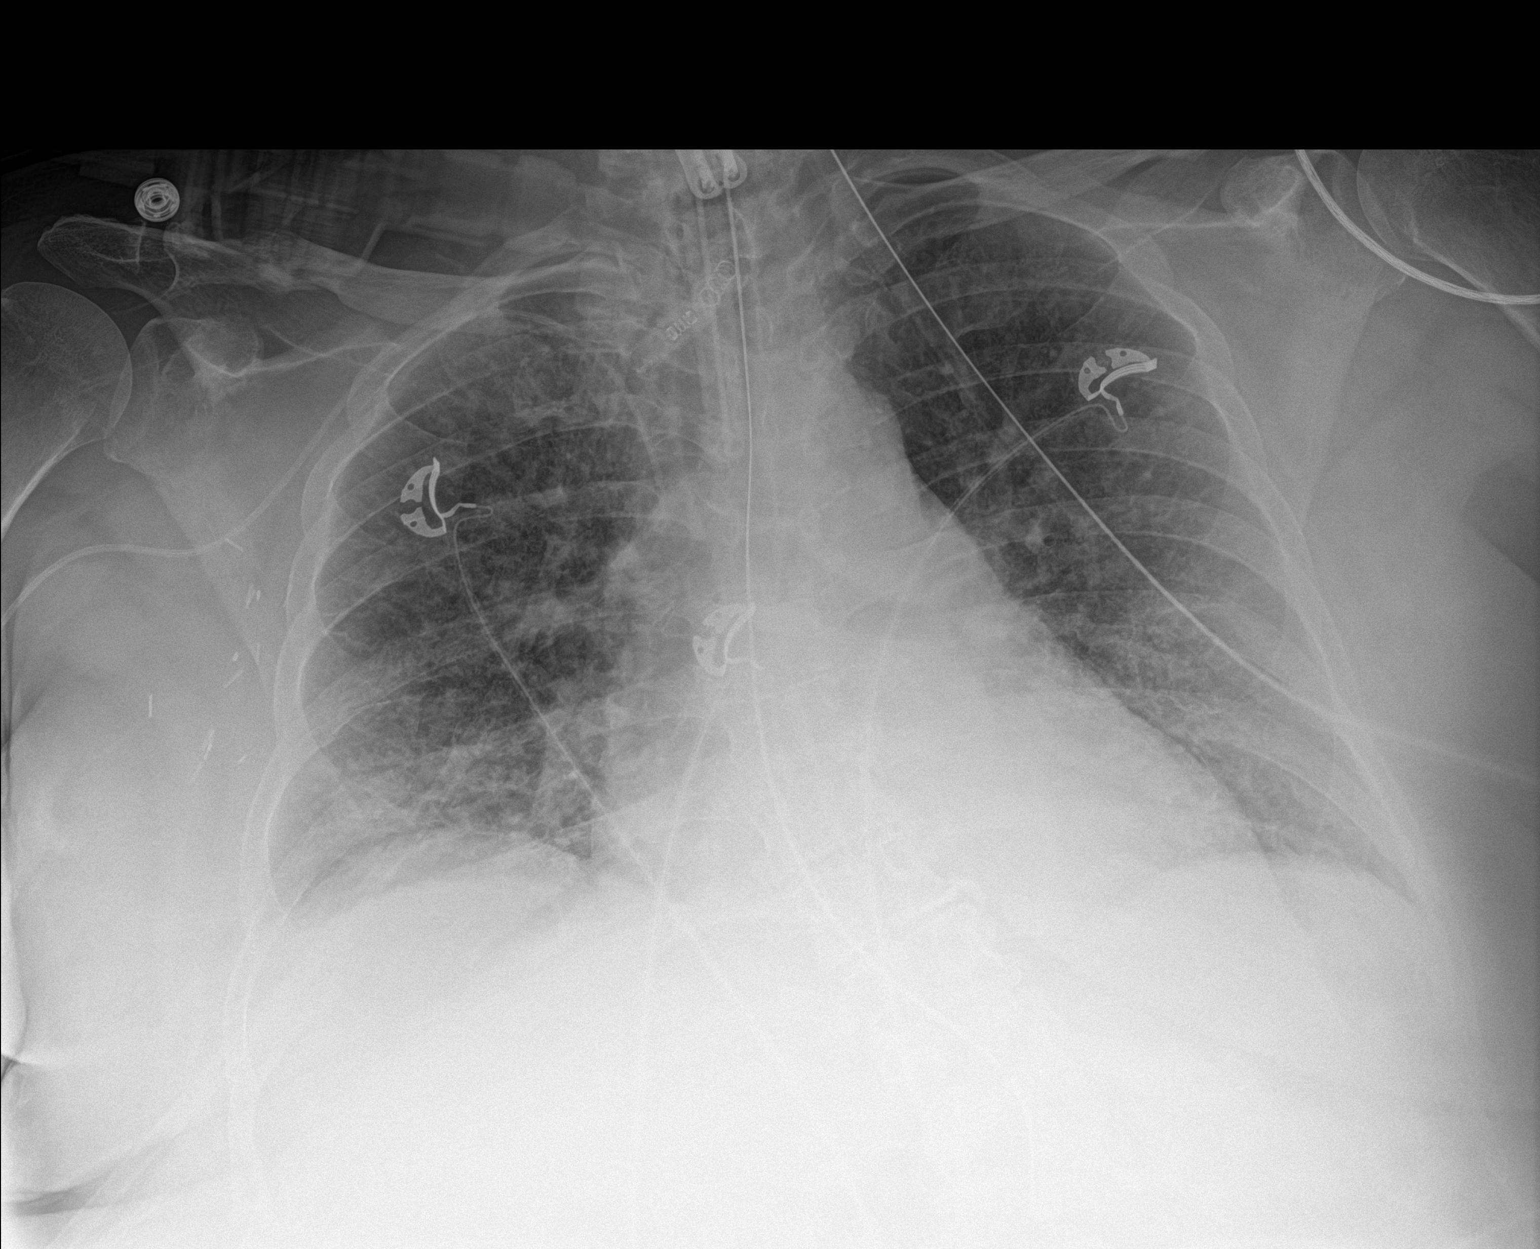

[1 of 1 positions shown; findings below may reference images not displayed]

FINDINGS: The right PICC line terminates in the central SVC. The ETT is in
good position. The NG tube terminates below today's study.
Cardiomegaly. The hila and mediastinum are unremarkable. Mild
interstitial prominence. Probable mild atelectasis in the right
base.
IMPRESSION: 1. Support apparatus as above.
2. Cardiomegaly and mild pulmonary edema. Mild atelectasis in the
right base.

## 2020-03-01 ENCOUNTER — Ambulatory Visit (INDEPENDENT_AMBULATORY_CARE_PROVIDER_SITE_OTHER)
Admission: RE | Admit: 2020-03-01 | Discharge: 2020-03-01 | Disposition: A | Discharge status: Home / Self Care | Payer: Medicare Other | Source: Ambulatory Visit | Attending: Acute Care | Admitting: Acute Care

## 2020-03-01 ENCOUNTER — Telehealth: Payer: Self-pay

## 2020-03-01 ENCOUNTER — Other Ambulatory Visit: Payer: Self-pay | Admitting: Family Medicine

## 2020-03-01 ENCOUNTER — Other Ambulatory Visit: Payer: Self-pay

## 2020-03-01 DIAGNOSIS — Z122 Encounter for screening for malignant neoplasm of respiratory organs: Secondary | ICD-10-CM

## 2020-03-01 DIAGNOSIS — Z87891 Personal history of nicotine dependence: Secondary | ICD-10-CM | POA: Diagnosis not present

## 2020-03-01 MED ORDER — PREGABALIN 150 MG PO CAPS: 300.0000 mg | ORAL_CAPSULE | Freq: Every day | ORAL | 0 refills | Status: DC

## 2020-03-01 NOTE — Telephone Encounter (Signed)
This is fine. I will change her med list to reflect this.

## 2020-03-01 NOTE — Telephone Encounter (Signed)
Patient calls nurse line requesting to increase dosage of Lyrica from 1 capsule to 2 capsules nightly for restless leg syndrome.   Please advise if dosage increase is appropriate.  To PCP  Talbot Grumbling, RN

## 2020-03-01 NOTE — Telephone Encounter (Signed)
Patient returned call to inform of below. Patient requesting refill as she does not currently have enough of medication to double the dosage.   To PCP  Talbot Grumbling, RN

## 2020-03-02 MED ORDER — PREGABALIN 150 MG PO CAPS: 300.0000 mg | ORAL_CAPSULE | Freq: Every day | ORAL | 1 refills | Status: DC

## 2020-03-02 NOTE — Progress Notes (Signed)
Please call patient and let them  know their  low dose Ct was read as a Lung RADS 2: nodules that are benign in appearance and behavior with a very low likelihood of becoming a clinically active cancer due to size or lack of growth. Recommendation per radiology is for a repeat LDCT in 12 months. .Please let them  know we will order and schedule their  annual screening scan for 02/2021. Please let them  know there was notation of CAD on their  scan.  Please remind the patient  that this is a non-gated exam therefore degree or severity of disease  cannot be determined. Please have them  follow up with their PCP regarding potential risk factor modification, dietary therapy or pharmacologic therapy if clinically indicated. Pt.  is currently on statin therapy. Please place order for annual  screening scan for  02/2021 and fax results to PCP. Thanks so much. 

## 2020-03-06 ENCOUNTER — Encounter (INDEPENDENT_AMBULATORY_CARE_PROVIDER_SITE_OTHER): Payer: Medicare Other | Admitting: Ophthalmology

## 2020-03-06 ENCOUNTER — Other Ambulatory Visit: Payer: Self-pay

## 2020-03-06 DIAGNOSIS — H34211 Partial retinal artery occlusion, right eye: Secondary | ICD-10-CM

## 2020-03-06 DIAGNOSIS — H33302 Unspecified retinal break, left eye: Secondary | ICD-10-CM

## 2020-03-06 DIAGNOSIS — I10 Essential (primary) hypertension: Secondary | ICD-10-CM

## 2020-03-06 DIAGNOSIS — H35033 Hypertensive retinopathy, bilateral: Secondary | ICD-10-CM | POA: Diagnosis not present

## 2020-03-06 DIAGNOSIS — H43813 Vitreous degeneration, bilateral: Secondary | ICD-10-CM

## 2020-03-06 DIAGNOSIS — H353231 Exudative age-related macular degeneration, bilateral, with active choroidal neovascularization: Secondary | ICD-10-CM | POA: Diagnosis not present

## 2020-03-10 ENCOUNTER — Telehealth: Payer: Self-pay | Admitting: Acute Care

## 2020-03-10 DIAGNOSIS — Z87891 Personal history of nicotine dependence: Secondary | ICD-10-CM

## 2020-03-10 NOTE — Telephone Encounter (Signed)
Pt informed of CT results per Sarah Groce, NP.  PT verbalized understanding.  Copy sent to PCP.  Order placed for 1 yr f/u CT.  

## 2020-03-15 ENCOUNTER — Ambulatory Visit: Payer: Medicare Other

## 2020-03-15 ENCOUNTER — Other Ambulatory Visit: Payer: Medicare Other

## 2020-03-16 DIAGNOSIS — M25512 Pain in left shoulder: Secondary | ICD-10-CM | POA: Diagnosis not present

## 2020-03-17 ENCOUNTER — Telehealth: Payer: Self-pay

## 2020-03-17 NOTE — Telephone Encounter (Signed)
Had changes to sleep medications (started and then increased lyrica) last month and was supposed to f/u in 2-4 weeks. Please have patient make appt to discuss. Thanks!

## 2020-03-17 NOTE — Telephone Encounter (Signed)
Patient calls nurse line regarding concerns with sleep medication. Patient reports that she is unable to sleep with taking trazadone. Patient reports "feeling bad" and having increased anxiety.   Patient requesting alternative sleep medication. Offered to schedule patient appointment to follow up on concerns, patient declined stating that it had not been that long since she had been seen.   To PCP  Please advise  Talbot Grumbling, RN

## 2020-03-20 DIAGNOSIS — H524 Presbyopia: Secondary | ICD-10-CM | POA: Diagnosis not present

## 2020-03-20 DIAGNOSIS — H35013 Changes in retinal vascular appearance, bilateral: Secondary | ICD-10-CM | POA: Diagnosis not present

## 2020-03-20 DIAGNOSIS — H35033 Hypertensive retinopathy, bilateral: Secondary | ICD-10-CM | POA: Diagnosis not present

## 2020-03-20 DIAGNOSIS — H35373 Puckering of macula, bilateral: Secondary | ICD-10-CM | POA: Diagnosis not present

## 2020-03-20 DIAGNOSIS — H353231 Exudative age-related macular degeneration, bilateral, with active choroidal neovascularization: Secondary | ICD-10-CM | POA: Diagnosis not present

## 2020-03-20 NOTE — Telephone Encounter (Signed)
Attempted to reach pt to make f/up appt. No answer. LVM for pt to call the office to make f/up appt to see how the increased dosage of lyrica is doing for her. Will try later.  Salvatore Marvel, CMA

## 2020-03-20 NOTE — Telephone Encounter (Signed)
Appt made for 5/10. Salvatore Marvel, CMA

## 2020-03-27 ENCOUNTER — Ambulatory Visit: Payer: Medicare Other | Admitting: Family Medicine

## 2020-03-27 NOTE — Progress Notes (Deleted)
    SUBJECTIVE:   CHIEF COMPLAINT / HPI:   Insomnia/RLS -Medications: Lyrica 300 mg daily, trazodone 50 mg as needed -Previously seen by neurology. -Previously on ropinirole for 4 years with gradual increase with likely augmentation. Also previously on pramipexole *** -CBC with Hb 11, previously on iron? -On CPAP?***  Still on O2?***  PERTINENT  PMH / PSH: HTN, CAD, internal hemorrhoids, A. fib, OSA, GERD, diverticulosis, IBS, OA, osteopenia, H/o breast cancer, HLD, obesity, anxiety/depression, insomnia, RLS  OBJECTIVE:   There were no vitals taken for this visit.  ***  ASSESSMENT/PLAN:   No problem-specific Assessment & Plan notes found for this encounter.     [name], [place]

## 2020-03-28 DIAGNOSIS — J449 Chronic obstructive pulmonary disease, unspecified: Secondary | ICD-10-CM | POA: Diagnosis not present

## 2020-04-03 ENCOUNTER — Encounter (INDEPENDENT_AMBULATORY_CARE_PROVIDER_SITE_OTHER): Payer: Medicare Other | Admitting: Ophthalmology

## 2020-04-05 ENCOUNTER — Encounter (INDEPENDENT_AMBULATORY_CARE_PROVIDER_SITE_OTHER): Payer: Medicare Other | Admitting: Ophthalmology

## 2020-04-10 ENCOUNTER — Encounter (INDEPENDENT_AMBULATORY_CARE_PROVIDER_SITE_OTHER): Payer: Medicare Other | Admitting: Ophthalmology

## 2020-04-18 ENCOUNTER — Encounter (INDEPENDENT_AMBULATORY_CARE_PROVIDER_SITE_OTHER): Payer: Medicare Other | Admitting: Ophthalmology

## 2020-04-19 ENCOUNTER — Inpatient Hospital Stay: Payer: Medicare Other

## 2020-04-19 ENCOUNTER — Inpatient Hospital Stay: Payer: Medicare Other | Attending: Nurse Practitioner

## 2020-04-24 ENCOUNTER — Other Ambulatory Visit: Payer: Self-pay

## 2020-04-24 ENCOUNTER — Encounter (INDEPENDENT_AMBULATORY_CARE_PROVIDER_SITE_OTHER): Payer: Medicare Other | Admitting: Ophthalmology

## 2020-04-24 DIAGNOSIS — H43813 Vitreous degeneration, bilateral: Secondary | ICD-10-CM

## 2020-04-24 DIAGNOSIS — H33302 Unspecified retinal break, left eye: Secondary | ICD-10-CM | POA: Diagnosis not present

## 2020-04-24 DIAGNOSIS — I10 Essential (primary) hypertension: Secondary | ICD-10-CM

## 2020-04-24 DIAGNOSIS — H353231 Exudative age-related macular degeneration, bilateral, with active choroidal neovascularization: Secondary | ICD-10-CM

## 2020-04-24 DIAGNOSIS — H35033 Hypertensive retinopathy, bilateral: Secondary | ICD-10-CM | POA: Diagnosis not present

## 2020-04-25 DIAGNOSIS — R0602 Shortness of breath: Secondary | ICD-10-CM | POA: Diagnosis not present

## 2020-04-25 DIAGNOSIS — I5022 Chronic systolic (congestive) heart failure: Secondary | ICD-10-CM | POA: Diagnosis not present

## 2020-04-25 DIAGNOSIS — I2510 Atherosclerotic heart disease of native coronary artery without angina pectoris: Secondary | ICD-10-CM | POA: Diagnosis not present

## 2020-04-25 DIAGNOSIS — I10 Essential (primary) hypertension: Secondary | ICD-10-CM | POA: Diagnosis not present

## 2020-04-28 DIAGNOSIS — R3915 Urgency of urination: Secondary | ICD-10-CM | POA: Diagnosis not present

## 2020-04-28 DIAGNOSIS — N200 Calculus of kidney: Secondary | ICD-10-CM | POA: Diagnosis not present

## 2020-05-07 ENCOUNTER — Other Ambulatory Visit: Payer: Self-pay | Admitting: Family Medicine

## 2020-05-08 ENCOUNTER — Encounter: Payer: Self-pay | Admitting: Family Medicine

## 2020-05-08 MED ORDER — AMIODARONE HCL 200 MG PO TABS: 200.0000 mg | ORAL_TABLET | Freq: Every day | ORAL | 0 refills | Status: DC

## 2020-05-12 DIAGNOSIS — E876 Hypokalemia: Secondary | ICD-10-CM | POA: Diagnosis not present

## 2020-05-12 DIAGNOSIS — E7849 Other hyperlipidemia: Secondary | ICD-10-CM | POA: Diagnosis not present

## 2020-05-12 DIAGNOSIS — D649 Anemia, unspecified: Secondary | ICD-10-CM | POA: Diagnosis not present

## 2020-05-12 DIAGNOSIS — Z79899 Other long term (current) drug therapy: Secondary | ICD-10-CM | POA: Diagnosis not present

## 2020-05-19 ENCOUNTER — Other Ambulatory Visit: Payer: Self-pay | Admitting: *Deleted

## 2020-05-19 MED ORDER — PREGABALIN 150 MG PO CAPS: 300.0000 mg | ORAL_CAPSULE | Freq: Every day | ORAL | 1 refills | Status: DC

## 2020-05-25 ENCOUNTER — Other Ambulatory Visit: Payer: Self-pay | Admitting: *Deleted

## 2020-05-25 MED ORDER — TRAZODONE HCL 50 MG PO TABS: 50.0000 mg | ORAL_TABLET | Freq: Every evening | ORAL | 0 refills | Status: DC | PRN

## 2020-05-29 ENCOUNTER — Encounter (INDEPENDENT_AMBULATORY_CARE_PROVIDER_SITE_OTHER): Payer: Medicare Other | Admitting: Ophthalmology

## 2020-05-29 ENCOUNTER — Other Ambulatory Visit: Payer: Self-pay

## 2020-05-29 DIAGNOSIS — H43813 Vitreous degeneration, bilateral: Secondary | ICD-10-CM | POA: Diagnosis not present

## 2020-05-29 DIAGNOSIS — H33302 Unspecified retinal break, left eye: Secondary | ICD-10-CM | POA: Diagnosis not present

## 2020-05-29 DIAGNOSIS — I10 Essential (primary) hypertension: Secondary | ICD-10-CM | POA: Diagnosis not present

## 2020-05-29 DIAGNOSIS — H35033 Hypertensive retinopathy, bilateral: Secondary | ICD-10-CM | POA: Diagnosis not present

## 2020-05-29 DIAGNOSIS — H353231 Exudative age-related macular degeneration, bilateral, with active choroidal neovascularization: Secondary | ICD-10-CM | POA: Diagnosis not present

## 2020-05-30 ENCOUNTER — Ambulatory Visit
Admission: RE | Admit: 2020-05-30 | Discharge: 2020-05-30 | Disposition: A | Discharge status: Home / Self Care | Payer: Medicare Other | Source: Ambulatory Visit | Attending: Nurse Practitioner | Admitting: Nurse Practitioner

## 2020-05-30 DIAGNOSIS — M85851 Other specified disorders of bone density and structure, right thigh: Secondary | ICD-10-CM | POA: Diagnosis not present

## 2020-05-30 DIAGNOSIS — M8580 Other specified disorders of bone density and structure, unspecified site: Secondary | ICD-10-CM

## 2020-05-30 DIAGNOSIS — Z1231 Encounter for screening mammogram for malignant neoplasm of breast: Secondary | ICD-10-CM | POA: Diagnosis not present

## 2020-05-30 DIAGNOSIS — C50511 Malignant neoplasm of lower-outer quadrant of right female breast: Secondary | ICD-10-CM

## 2020-06-01 DIAGNOSIS — M47816 Spondylosis without myelopathy or radiculopathy, lumbar region: Secondary | ICD-10-CM | POA: Diagnosis not present

## 2020-06-02 ENCOUNTER — Emergency Department (HOSPITAL_COMMUNITY): Payer: Medicare Other

## 2020-06-02 ENCOUNTER — Emergency Department (HOSPITAL_COMMUNITY)
Admission: EM | Admit: 2020-06-02 | Discharge: 2020-06-03 | Disposition: A | Discharge status: Home / Self Care | Payer: Medicare Other | Attending: Emergency Medicine | Admitting: Emergency Medicine

## 2020-06-02 ENCOUNTER — Telehealth: Payer: Self-pay | Admitting: Family Medicine

## 2020-06-02 ENCOUNTER — Encounter (HOSPITAL_COMMUNITY): Payer: Self-pay

## 2020-06-02 DIAGNOSIS — Z7951 Long term (current) use of inhaled steroids: Secondary | ICD-10-CM | POA: Diagnosis not present

## 2020-06-02 DIAGNOSIS — I10 Essential (primary) hypertension: Secondary | ICD-10-CM | POA: Insufficient documentation

## 2020-06-02 DIAGNOSIS — Z743 Need for continuous supervision: Secondary | ICD-10-CM | POA: Diagnosis not present

## 2020-06-02 DIAGNOSIS — R61 Generalized hyperhidrosis: Secondary | ICD-10-CM | POA: Diagnosis not present

## 2020-06-02 DIAGNOSIS — Z7901 Long term (current) use of anticoagulants: Secondary | ICD-10-CM | POA: Diagnosis not present

## 2020-06-02 DIAGNOSIS — J449 Chronic obstructive pulmonary disease, unspecified: Secondary | ICD-10-CM | POA: Insufficient documentation

## 2020-06-02 DIAGNOSIS — Z79899 Other long term (current) drug therapy: Secondary | ICD-10-CM | POA: Diagnosis not present

## 2020-06-02 DIAGNOSIS — R110 Nausea: Secondary | ICD-10-CM | POA: Diagnosis not present

## 2020-06-02 DIAGNOSIS — F17290 Nicotine dependence, other tobacco product, uncomplicated: Secondary | ICD-10-CM | POA: Insufficient documentation

## 2020-06-02 DIAGNOSIS — R6889 Other general symptoms and signs: Secondary | ICD-10-CM | POA: Diagnosis not present

## 2020-06-02 DIAGNOSIS — I25119 Atherosclerotic heart disease of native coronary artery with unspecified angina pectoris: Secondary | ICD-10-CM | POA: Diagnosis not present

## 2020-06-02 DIAGNOSIS — R42 Dizziness and giddiness: Secondary | ICD-10-CM | POA: Insufficient documentation

## 2020-06-02 DIAGNOSIS — R404 Transient alteration of awareness: Secondary | ICD-10-CM | POA: Diagnosis not present

## 2020-06-02 LAB — COMPREHENSIVE METABOLIC PANEL
ALT: 16 U/L (ref 0–44)
AST: 22 U/L (ref 15–41)
Albumin: 3.6 g/dL (ref 3.5–5.0)
Alkaline Phosphatase: 80 U/L (ref 38–126)
Anion gap: 10 (ref 5–15)
BUN: 15 mg/dL (ref 8–23)
CO2: 25 mmol/L (ref 22–32)
Calcium: 9.1 mg/dL (ref 8.9–10.3)
Chloride: 105 mmol/L (ref 98–111)
Creatinine, Ser: 0.93 mg/dL (ref 0.44–1.00)
GFR calc Af Amer: >60 mL/min (ref >60)
GFR calc non Af Amer: 60 mL/min — ABNORMAL LOW (ref >60)
Glucose, Bld: 135 mg/dL — ABNORMAL HIGH (ref 70–99)
Potassium: 4.1 mmol/L (ref 3.5–5.1)
Sodium: 140 mmol/L (ref 135–145)
Total Bilirubin: 0.3 mg/dL (ref 0.3–1.2)
Total Protein: 6.3 g/dL — ABNORMAL LOW (ref 6.5–8.1)

## 2020-06-02 LAB — CBC
HCT: 41.1 % (ref 36.0–46.0)
Hemoglobin: 12.5 g/dL (ref 12.0–15.0)
MCH: 28 pg (ref 26.0–34.0)
MCHC: 30.4 g/dL (ref 30.0–36.0)
MCV: 92.2 fL (ref 80.0–100.0)
Platelets: 208 10*3/uL (ref 150–400)
RBC: 4.46 MIL/uL (ref 3.87–5.11)
RDW: 15.8 % — ABNORMAL HIGH (ref 11.5–15.5)
WBC: 17.5 10*3/uL — ABNORMAL HIGH (ref 4.0–10.5)
nRBC: 0 % (ref 0.0–0.2)

## 2020-06-02 LAB — DIFFERENTIAL
Abs Immature Granulocytes: 0.16 10*3/uL — ABNORMAL HIGH (ref 0.00–0.07)
Basophils Absolute: 0 10*3/uL (ref 0.0–0.1)
Basophils Relative: 0 %
Eosinophils Absolute: 0 10*3/uL (ref 0.0–0.5)
Eosinophils Relative: 0 %
Immature Granulocytes: 1 %
Lymphocytes Relative: 12 %
Lymphs Abs: 2.1 10*3/uL (ref 0.7–4.0)
Monocytes Absolute: 1.2 10*3/uL — ABNORMAL HIGH (ref 0.1–1.0)
Monocytes Relative: 7 %
Neutro Abs: 14.1 10*3/uL — ABNORMAL HIGH (ref 1.7–7.7)
Neutrophils Relative %: 80 %

## 2020-06-02 LAB — PROTIME-INR
INR: 1.1 (ref 0.8–1.2)
Prothrombin Time: 13.9 seconds (ref 11.4–15.2)

## 2020-06-02 LAB — APTT: aPTT: 32 seconds (ref 24–36)

## 2020-06-02 MED ORDER — SODIUM CHLORIDE 0.9% FLUSH: 3.0000 mL | Freq: Once | INTRAVENOUS | Status: DC

## 2020-06-02 NOTE — ED Triage Notes (Signed)
Pt arrives to ED via [facility] from home w/ c/o sudden onset of dizziness and nausea that started at [facility]. Pt received 4mg  zofran w/ EMS w/ some relief of nausea. EMS VSS, EMS EKG unremarkable, EMS stroke screen negative.

## 2020-06-02 NOTE — Telephone Encounter (Signed)
**  After Hours/ Emergency Line Call**  Received a call to report that Zadaya Cuadra was in usual state of health until 1 hr ago when she got extremely dizzy and could barely walk.  She states that she was able to get herself to the couch and is currently sitting on the couch.  Her son is with her.  She is sweating "profesly."  States that she does not have chest pain or shortness of breath.  She does state, "I have the feeling of anxiety in my chest."  Continues to have significant nausea.  Recommended that given her history of CAD and age, she should be seen in ED for further evaluation.  Advised that if son is unable to safely get her into a car, they should call EMS for transport.  She voices understanding and agrees to the plan.  Will forward to PCP.  [place] Constable, DO PGY-3, [facility] 06/02/2020 7:58 PM

## 2020-06-03 ENCOUNTER — Other Ambulatory Visit: Payer: Self-pay

## 2020-06-03 DIAGNOSIS — I25119 Atherosclerotic heart disease of native coronary artery with unspecified angina pectoris: Secondary | ICD-10-CM | POA: Diagnosis not present

## 2020-06-03 DIAGNOSIS — I2510 Atherosclerotic heart disease of native coronary artery without angina pectoris: Secondary | ICD-10-CM | POA: Diagnosis not present

## 2020-06-03 DIAGNOSIS — G253 Myoclonus: Secondary | ICD-10-CM | POA: Diagnosis not present

## 2020-06-03 DIAGNOSIS — I10 Essential (primary) hypertension: Secondary | ICD-10-CM | POA: Diagnosis not present

## 2020-06-03 DIAGNOSIS — I6782 Cerebral ischemia: Secondary | ICD-10-CM | POA: Diagnosis not present

## 2020-06-03 DIAGNOSIS — R42 Dizziness and giddiness: Secondary | ICD-10-CM | POA: Diagnosis not present

## 2020-06-03 DIAGNOSIS — J9601 Acute respiratory failure with hypoxia: Secondary | ICD-10-CM | POA: Diagnosis not present

## 2020-06-03 DIAGNOSIS — N179 Acute kidney failure, unspecified: Secondary | ICD-10-CM | POA: Diagnosis not present

## 2020-06-03 DIAGNOSIS — E872 Acidosis: Secondary | ICD-10-CM | POA: Diagnosis not present

## 2020-06-03 DIAGNOSIS — I5032 Chronic diastolic (congestive) heart failure: Secondary | ICD-10-CM | POA: Diagnosis not present

## 2020-06-03 DIAGNOSIS — J9621 Acute and chronic respiratory failure with hypoxia: Secondary | ICD-10-CM | POA: Diagnosis not present

## 2020-06-03 DIAGNOSIS — I272 Pulmonary hypertension, unspecified: Secondary | ICD-10-CM | POA: Diagnosis not present

## 2020-06-03 DIAGNOSIS — I288 Other diseases of pulmonary vessels: Secondary | ICD-10-CM | POA: Diagnosis not present

## 2020-06-03 DIAGNOSIS — J439 Emphysema, unspecified: Secondary | ICD-10-CM | POA: Diagnosis not present

## 2020-06-03 DIAGNOSIS — M6250 Muscle wasting and atrophy, not elsewhere classified, unspecified site: Secondary | ICD-10-CM | POA: Diagnosis not present

## 2020-06-03 DIAGNOSIS — F17290 Nicotine dependence, other tobacco product, uncomplicated: Secondary | ICD-10-CM | POA: Diagnosis not present

## 2020-06-03 DIAGNOSIS — F17210 Nicotine dependence, cigarettes, uncomplicated: Secondary | ICD-10-CM | POA: Diagnosis not present

## 2020-06-03 DIAGNOSIS — I5030 Unspecified diastolic (congestive) heart failure: Secondary | ICD-10-CM | POA: Diagnosis not present

## 2020-06-03 DIAGNOSIS — J441 Chronic obstructive pulmonary disease with (acute) exacerbation: Secondary | ICD-10-CM | POA: Diagnosis not present

## 2020-06-03 DIAGNOSIS — K219 Gastro-esophageal reflux disease without esophagitis: Secondary | ICD-10-CM | POA: Diagnosis not present

## 2020-06-03 DIAGNOSIS — R531 Weakness: Secondary | ICD-10-CM | POA: Diagnosis not present

## 2020-06-03 DIAGNOSIS — R0902 Hypoxemia: Secondary | ICD-10-CM | POA: Diagnosis not present

## 2020-06-03 DIAGNOSIS — G4733 Obstructive sleep apnea (adult) (pediatric): Secondary | ICD-10-CM | POA: Diagnosis not present

## 2020-06-03 DIAGNOSIS — I517 Cardiomegaly: Secondary | ICD-10-CM | POA: Diagnosis not present

## 2020-06-03 DIAGNOSIS — I4891 Unspecified atrial fibrillation: Secondary | ICD-10-CM | POA: Diagnosis not present

## 2020-06-03 DIAGNOSIS — R410 Disorientation, unspecified: Secondary | ICD-10-CM | POA: Diagnosis not present

## 2020-06-03 DIAGNOSIS — J449 Chronic obstructive pulmonary disease, unspecified: Secondary | ICD-10-CM | POA: Diagnosis not present

## 2020-06-03 DIAGNOSIS — R110 Nausea: Secondary | ICD-10-CM | POA: Diagnosis not present

## 2020-06-03 DIAGNOSIS — G2581 Restless legs syndrome: Secondary | ICD-10-CM | POA: Diagnosis not present

## 2020-06-03 DIAGNOSIS — R918 Other nonspecific abnormal finding of lung field: Secondary | ICD-10-CM | POA: Diagnosis not present

## 2020-06-03 DIAGNOSIS — Z9981 Dependence on supplemental oxygen: Secondary | ICD-10-CM | POA: Diagnosis not present

## 2020-06-03 DIAGNOSIS — K449 Diaphragmatic hernia without obstruction or gangrene: Secondary | ICD-10-CM | POA: Diagnosis not present

## 2020-06-03 DIAGNOSIS — Z7951 Long term (current) use of inhaled steroids: Secondary | ICD-10-CM | POA: Diagnosis not present

## 2020-06-03 DIAGNOSIS — D72829 Elevated white blood cell count, unspecified: Secondary | ICD-10-CM | POA: Diagnosis not present

## 2020-06-03 DIAGNOSIS — Z79899 Other long term (current) drug therapy: Secondary | ICD-10-CM | POA: Diagnosis not present

## 2020-06-03 DIAGNOSIS — Z7901 Long term (current) use of anticoagulants: Secondary | ICD-10-CM | POA: Diagnosis not present

## 2020-06-03 DIAGNOSIS — R6889 Other general symptoms and signs: Secondary | ICD-10-CM | POA: Diagnosis not present

## 2020-06-03 DIAGNOSIS — I110 Hypertensive heart disease with heart failure: Secondary | ICD-10-CM | POA: Diagnosis not present

## 2020-06-03 DIAGNOSIS — Z20822 Contact with and (suspected) exposure to covid-19: Secondary | ICD-10-CM | POA: Diagnosis not present

## 2020-06-03 DIAGNOSIS — H9319 Tinnitus, unspecified ear: Secondary | ICD-10-CM | POA: Diagnosis not present

## 2020-06-03 DIAGNOSIS — E785 Hyperlipidemia, unspecified: Secondary | ICD-10-CM | POA: Diagnosis not present

## 2020-06-03 DIAGNOSIS — R0602 Shortness of breath: Secondary | ICD-10-CM | POA: Diagnosis not present

## 2020-06-03 MED ORDER — PROCHLORPERAZINE EDISYLATE 10 MG/2ML IJ SOLN
10.0000 mg | Freq: Once | INTRAMUSCULAR | Status: AC
  Administered 2020-06-03: 10 mg via INTRAVENOUS
  Filled 2020-06-03: qty 2

## 2020-06-03 MED ORDER — LORAZEPAM 2 MG/ML IJ SOLN
1.0000 mg | Freq: Once | INTRAMUSCULAR | Status: AC
  Administered 2020-06-03: 1 mg via INTRAVENOUS
  Filled 2020-06-03: qty 1

## 2020-06-03 MED ORDER — MECLIZINE HCL 25 MG PO TABS
25.0000 mg | ORAL_TABLET | Freq: Once | ORAL | Status: AC
  Administered 2020-06-03: 25 mg via ORAL
  Filled 2020-06-03: qty 1

## 2020-06-03 MED ORDER — HALOPERIDOL 5 MG PO TABS
5.0000 mg | ORAL_TABLET | Freq: Once | ORAL | Status: AC
  Administered 2020-06-03: 5 mg via ORAL
  Filled 2020-06-03: qty 1

## 2020-06-03 MED ORDER — NICOTINE 7 MG/24HR TD PT24
7.0000 mg | MEDICATED_PATCH | Freq: Once | TRANSDERMAL | Status: DC
  Filled 2020-06-03: qty 1

## 2020-06-03 MED ORDER — MECLIZINE HCL 12.5 MG PO TABS
12.5000 mg | ORAL_TABLET | Freq: Three times a day (TID) | ORAL | 0 refills | Status: AC
Start: 2020-06-03 — End: 2020-06-10

## 2020-06-03 NOTE — Discharge Instructions (Signed)
As discussed, with your dizziness it is important you follow-up with your physician and our neurology colleagues. Return here for concerning changes in your condition.
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
## 2020-06-03 NOTE — ED Notes (Signed)
Have checked on pt a few of times since arrival; each time I enter the room the patient's O2 level has been approx 86-87% with a good pleth. When asking pt about home oxygen - she states that "yea, I used to wear it I just don't anymore". After medications pt continues to be dizzy, so I placed pt on oxygen via [place] @ 2Lpm

## 2020-06-03 NOTE — ED Notes (Signed)
Pt continues to remove her monitoring equipment, states that she cannot go through with the MRI unless she is "out", etc. Pt has been texting family/friend to pick her up. Pt is extremely restless; provider made aware.

## 2020-06-03 NOTE — ED Notes (Signed)
Pt removed cords and oxygen and refuses to go to MRI. Pt sp02 is dropping because pt keeps taking off her o2.

## 2020-06-03 NOTE — ED Provider Notes (Signed)
7:41 AM Patient awake, alert, sitting upright requesting water. Patient has had multiple meds in attempt to improve her comfort level with MRI, but she continues to defer this exam. Patient comfortable with discharge, outpatient follow-up    Carmin Muskrat, MD 06/03/20 [phone]

## 2020-06-03 NOTE — ED Notes (Signed)
Patient has removed monitoring equipment. Advised of importance of keeping it on.

## 2020-06-07 DIAGNOSIS — R0902 Hypoxemia: Secondary | ICD-10-CM | POA: Diagnosis not present

## 2020-06-08 DIAGNOSIS — R0902 Hypoxemia: Secondary | ICD-10-CM | POA: Diagnosis not present

## 2020-06-08 DIAGNOSIS — R0600 Dyspnea, unspecified: Secondary | ICD-10-CM | POA: Diagnosis not present

## 2020-06-08 DIAGNOSIS — R531 Weakness: Secondary | ICD-10-CM | POA: Diagnosis not present

## 2020-06-08 DIAGNOSIS — J449 Chronic obstructive pulmonary disease, unspecified: Secondary | ICD-10-CM | POA: Diagnosis not present

## 2020-06-12 ENCOUNTER — Other Ambulatory Visit: Payer: Self-pay

## 2020-06-12 ENCOUNTER — Ambulatory Visit (INDEPENDENT_AMBULATORY_CARE_PROVIDER_SITE_OTHER): Payer: Medicare Other | Admitting: Family Medicine

## 2020-06-12 VITALS — BP 118/62 | HR 63 | Ht 61.0 in | Wt 233.0 lb

## 2020-06-12 DIAGNOSIS — R42 Dizziness and giddiness: Secondary | ICD-10-CM | POA: Diagnosis not present

## 2020-06-12 DIAGNOSIS — J383 Other diseases of vocal cords: Secondary | ICD-10-CM | POA: Diagnosis not present

## 2020-06-12 DIAGNOSIS — F419 Anxiety disorder, unspecified: Secondary | ICD-10-CM

## 2020-06-12 DIAGNOSIS — J9610 Chronic respiratory failure, unspecified whether with hypoxia or hypercapnia: Secondary | ICD-10-CM | POA: Insufficient documentation

## 2020-06-12 NOTE — Assessment & Plan Note (Signed)
Based on risk factors, I am concerned this may be related to a CVA.  MRI was not performed in hospital due to claustrophobia.  New MRI order placed today for an open MRI.  She can only make MRI appointment on Monday's.  Based on physical exam, there is evidence this may be a peripheral vertigo etiology and she may benefit from vestibular rehab. -Referral to vestibular rehab -MRI brain order placed

## 2020-06-12 NOTE — Progress Notes (Signed)
SUBJECTIVE:   CHIEF COMPLAINT / HPI:   Vertigo Martha Mora reports that her dizziness started a little over a week ago and she recalls the specific moment it began.  She notes that she bent over to hand something to her dog and when she stood upright again she noted a profound dizziness.  Since the onset of this dizziness it has been steady and persistent.  She has trouble walking and can only walk comfortably with a walker.  She has since been admitted to the hospital and had an unremarkable head CT.  She was not able to have an MRI brain performed because of claustrophobia.  She is not currently taking any medications for dizziness.  She wants to know if there is any medications that will help with her dizziness.  Chronic hypoxic respiratory failure During her recent hospitalization, she was advised to continue using oxygen at home with activity.  In the hospital, she was found to have a hospital requirement of 2 L with activity.  She is not currently using any oxygen.  She wants to know why she needed to follow-up with her pulmonologist if her lungs do not seem to be getting any worse.  Anxiety During her recent hospitalization at [facility], she suffered from significant anxiety and was treated with Seroquel.  She brought this bottle with her today so that it could be appropriately documented in her chart.  Her anxiety is currently well controlled.  Vocal cord dysfunction In the hospital, it was noted that she may be suffering from some vocal cord dysfunction and may benefit from a referral to ENT.  She reports that she has had a mildly low pitched voice for the past 2 months but does not recall exactly when this started or any precipitating symptoms.  She is interested in referral to ENT.  PERTINENT  PMH / PSH: CAD, HTN, A. fib, chronic respiratory failure,  OBJECTIVE:   BP (!) 118/62   Pulse 63   Ht 5\' 1"  (1.549 m)   Wt (!) 233 lb (105.7 kg)   BMI 44.02 kg/m    General: Seated  comfortably in the chair in the exam room.  Leaning forward with her hands resting on her walker.  Able to stand up during the encounter and step up onto the exam table.  No acute distress. HEENT: Extraocular muscles intact.  No evidence of nystagmus.  Epley maneuver attempted in clinic without resolution of her symptoms but did seem to induce some dizziness upon sitting up. Cardiac: Regular rate and rhythm Respiratory: Breathing comfortably on room air.  No respiratory distress despite not using oxygen.  ASSESSMENT/PLAN:   Vertigo Based on risk factors, I am concerned this may be related to a CVA.  MRI was not performed in hospital due to claustrophobia.  New MRI order placed today for an open MRI.  She can only make MRI appointment on Monday's.  Based on physical exam, there is evidence this may be a peripheral vertigo etiology and she may benefit from vestibular rehab. -Referral to vestibular rehab -MRI brain order placed  Vocal cord dysfunction -Referral to ENT  Anxiety Seroquel documented in chart.  No medication changes made at this time.  Chronic respiratory failure [facility]) Per chart review, it appears that her last communication with our pulmonology was in December 2020.  She was encouraged to reach out to reestablish care for routine visits with pulmonology.     Matilde Haymaker, MD [place]

## 2020-06-12 NOTE — Assessment & Plan Note (Signed)
Referral to ENT °

## 2020-06-12 NOTE — Assessment & Plan Note (Signed)
Seroquel documented in chart.  No medication changes made at this time.

## 2020-06-12 NOTE — Assessment & Plan Note (Signed)
Per chart review, it appears that her last communication with our pulmonology was in December 2020.  She was encouraged to reach out to reestablish care for routine visits with pulmonology.

## 2020-06-13 ENCOUNTER — Other Ambulatory Visit: Payer: Self-pay

## 2020-06-13 DIAGNOSIS — J9620 Acute and chronic respiratory failure, unspecified whether with hypoxia or hypercapnia: Secondary | ICD-10-CM | POA: Diagnosis not present

## 2020-06-13 DIAGNOSIS — I10 Essential (primary) hypertension: Secondary | ICD-10-CM | POA: Diagnosis not present

## 2020-06-13 DIAGNOSIS — J441 Chronic obstructive pulmonary disease with (acute) exacerbation: Secondary | ICD-10-CM | POA: Diagnosis not present

## 2020-06-13 MED ORDER — BUSPIRONE HCL 10 MG PO TABS: 10.0000 mg | ORAL_TABLET | Freq: Three times a day (TID) | ORAL | 1 refills | Status: DC

## 2020-06-15 DIAGNOSIS — J9620 Acute and chronic respiratory failure, unspecified whether with hypoxia or hypercapnia: Secondary | ICD-10-CM | POA: Diagnosis not present

## 2020-06-15 DIAGNOSIS — J441 Chronic obstructive pulmonary disease with (acute) exacerbation: Secondary | ICD-10-CM | POA: Diagnosis not present

## 2020-06-15 DIAGNOSIS — I10 Essential (primary) hypertension: Secondary | ICD-10-CM | POA: Diagnosis not present

## 2020-06-16 ENCOUNTER — Other Ambulatory Visit: Payer: Self-pay

## 2020-06-16 MED ORDER — QUETIAPINE FUMARATE 25 MG PO TABS: 25.0000 mg | ORAL_TABLET | Freq: Every day | ORAL | 0 refills | Status: DC

## 2020-06-16 NOTE — Telephone Encounter (Signed)
Patient calls nurse line reporting she needs a refill on Seroquel. Patient reports this was given to here during her hospital stay. Patient reports she forgot to mention this to Pilar Plate at their hospital fu. Will forward to PCP.

## 2020-06-19 NOTE — Telephone Encounter (Signed)
Pt informed and appt made. Margueritte Guthridge, CMA ° °

## 2020-06-20 DIAGNOSIS — J9620 Acute and chronic respiratory failure, unspecified whether with hypoxia or hypercapnia: Secondary | ICD-10-CM | POA: Diagnosis not present

## 2020-06-20 DIAGNOSIS — I10 Essential (primary) hypertension: Secondary | ICD-10-CM | POA: Diagnosis not present

## 2020-06-20 DIAGNOSIS — J441 Chronic obstructive pulmonary disease with (acute) exacerbation: Secondary | ICD-10-CM | POA: Diagnosis not present

## 2020-06-21 ENCOUNTER — Telehealth: Payer: Self-pay | Admitting: *Deleted

## 2020-06-21 DIAGNOSIS — J9620 Acute and chronic respiratory failure, unspecified whether with hypoxia or hypercapnia: Secondary | ICD-10-CM | POA: Diagnosis not present

## 2020-06-21 DIAGNOSIS — I10 Essential (primary) hypertension: Secondary | ICD-10-CM | POA: Diagnosis not present

## 2020-06-21 DIAGNOSIS — J441 Chronic obstructive pulmonary disease with (acute) exacerbation: Secondary | ICD-10-CM | POA: Diagnosis not present

## 2020-06-21 NOTE — Addendum Note (Signed)
Addended by: Valerie Roys on: 06/21/2020 08:41 AM   Modules accepted: Orders

## 2020-06-21 NOTE — Telephone Encounter (Signed)
Referral printed and faxed to Dr. Redmond Baseman' office. Saniyah Mondesir,CMA

## 2020-06-21 NOTE — Telephone Encounter (Signed)
-----   Message from [name], [place] sent at 06/21/2020  4:55 PM EDT ----- Martha Mora!    This pt called and said that she has an appt with ENT (Dr. Redmond Baseman) for 07/07/20 and they need a referral.  I see where there is a referral in the system that says new.  Can you check into that?

## 2020-06-22 ENCOUNTER — Other Ambulatory Visit: Payer: Self-pay | Admitting: Family Medicine

## 2020-06-22 DIAGNOSIS — J9620 Acute and chronic respiratory failure, unspecified whether with hypoxia or hypercapnia: Secondary | ICD-10-CM | POA: Diagnosis not present

## 2020-06-22 DIAGNOSIS — J441 Chronic obstructive pulmonary disease with (acute) exacerbation: Secondary | ICD-10-CM | POA: Diagnosis not present

## 2020-06-22 DIAGNOSIS — I10 Essential (primary) hypertension: Secondary | ICD-10-CM | POA: Diagnosis not present

## 2020-06-25 ENCOUNTER — Other Ambulatory Visit: Payer: Self-pay | Admitting: Family Medicine

## 2020-06-27 IMAGING — CT CT HEAD WITHOUT CONTRAST
3 series · 16 of 47 positions shown, 19 images · non-contrast
Comparison: None.

CLINICAL DATA: Left head injury, headache, on Eliquis

EXAM:
CT HEAD WITHOUT CONTRAST
TECHNIQUE: Contiguous axial images were obtained from the base of the skull
through the vertex without intravenous contrast.

[Series 2: head wo · axial · 0.47mm/px · z∈[-11,+119]mm · 10 of 32 slices shown, 13 images]
[im 3/32  brain]
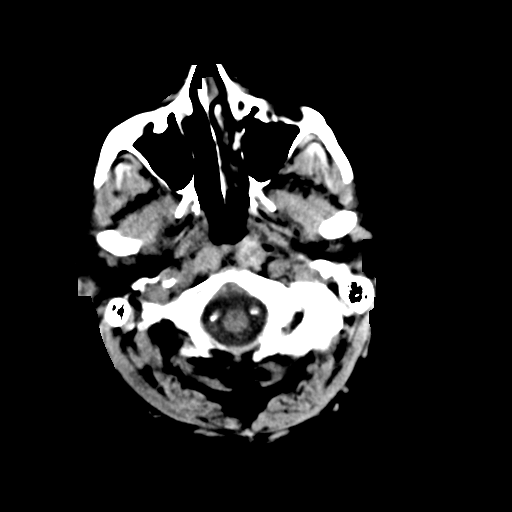
[im 3/32  bone]
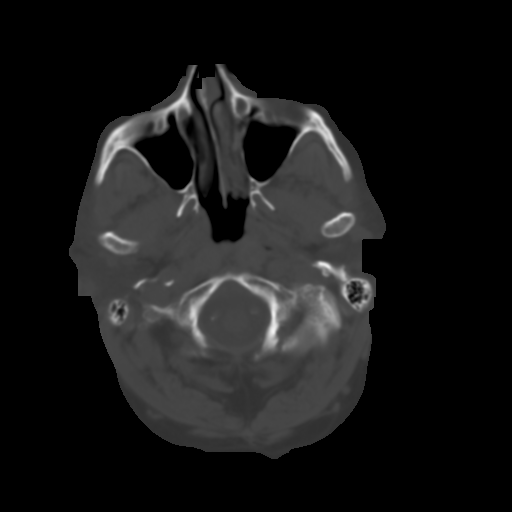
[im 6/32  brain]
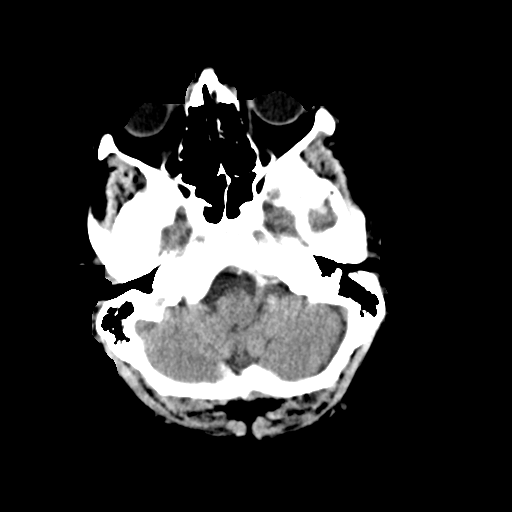
[im 9/32  brain]
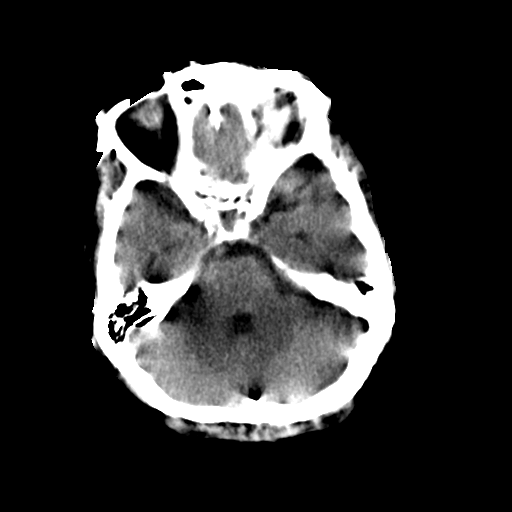
[im 11/32  brain]
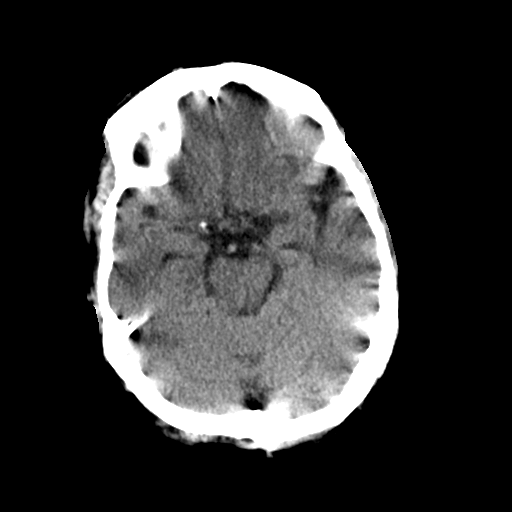
[im 14/32  brain]
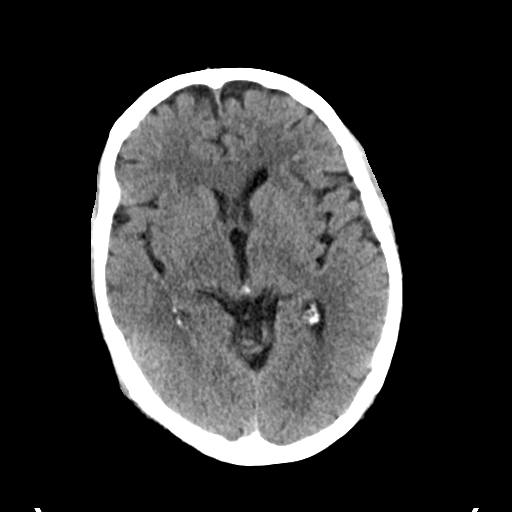
[im 14/32  bone]
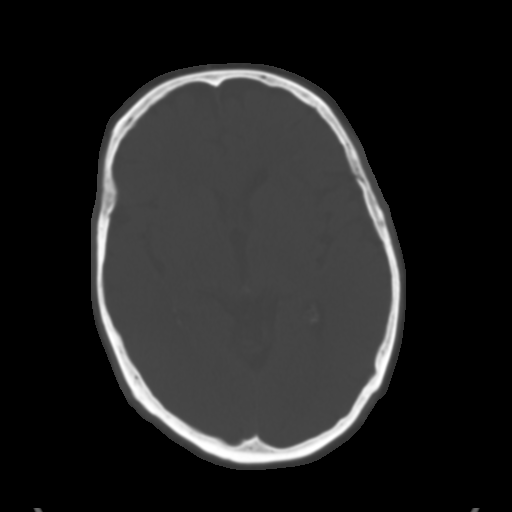
[im 18/32  brain]
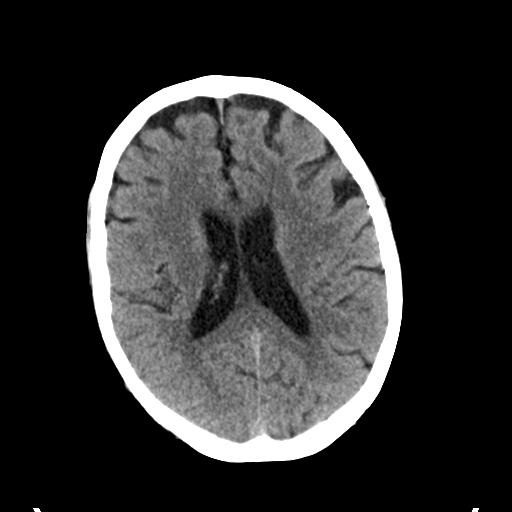
[im 21/32  brain]
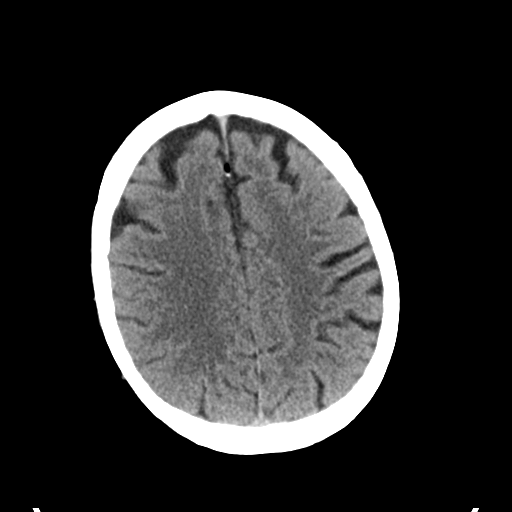
[im 24/32  brain]
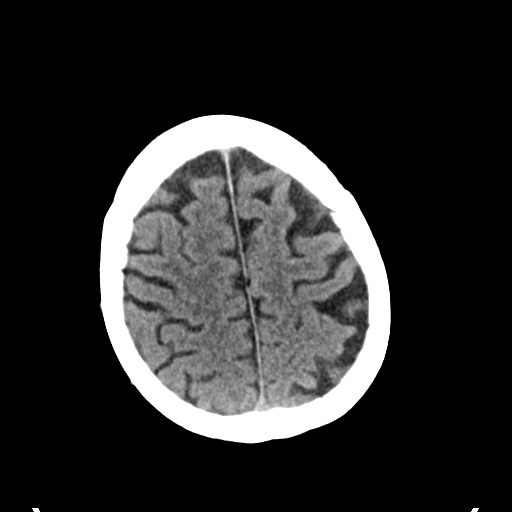
[im 26/32  brain]
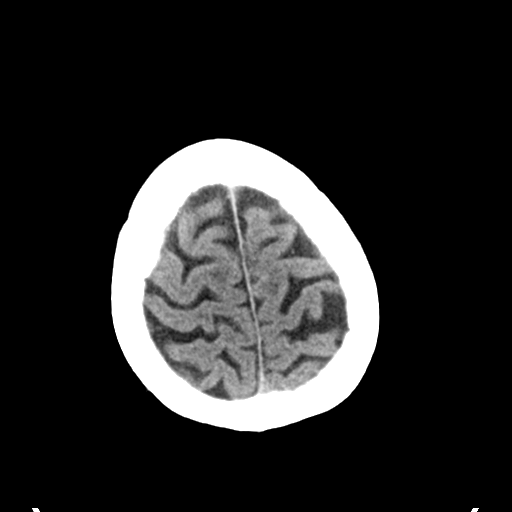
[im 26/32  bone]
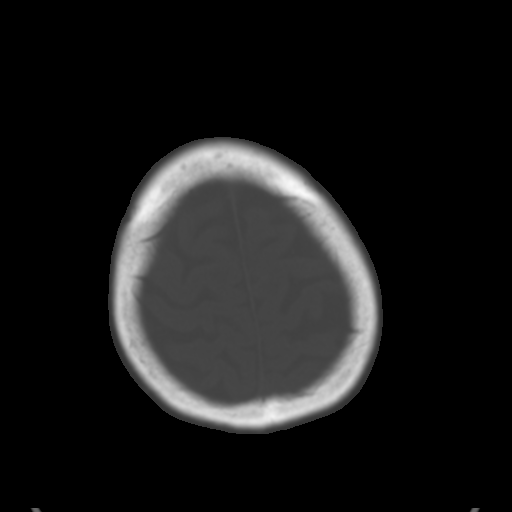
[im 29/32  brain]
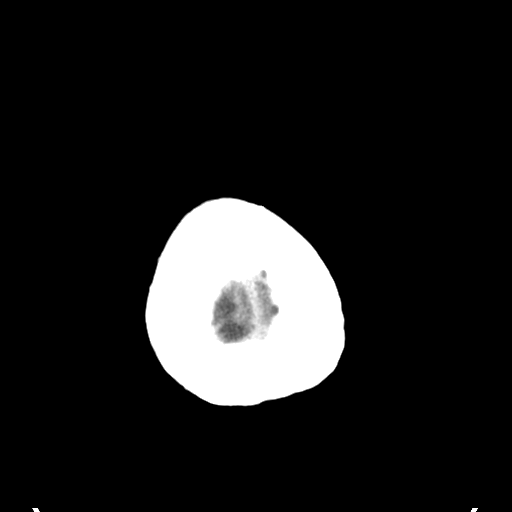

[Series 4: coronal soft tissue · coronal · 0.29mm/px · 3 of 63 slices shown]
[im 21/63  brain]
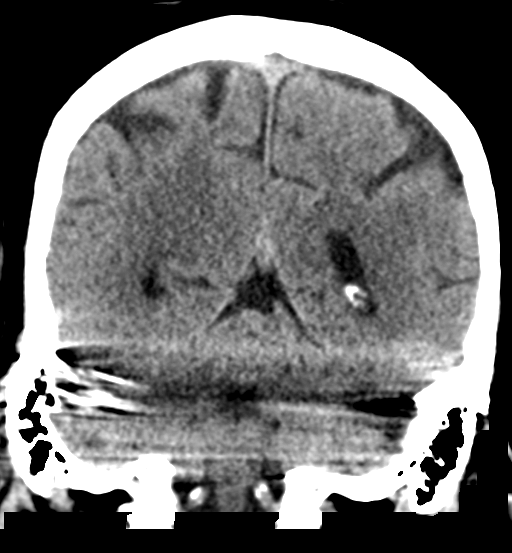
[im 28/63  brain]
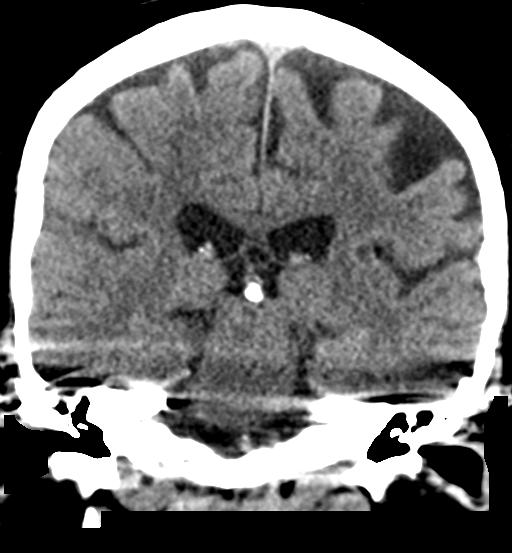
[im 35/63  brain]
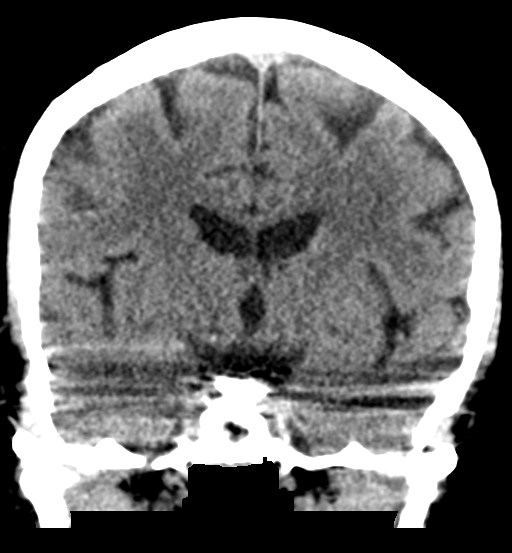

[Series 5: sagittal soft tissue · sagittal · 0.31mm/px · 3 of 51 slices shown]
[im 17/51  brain]
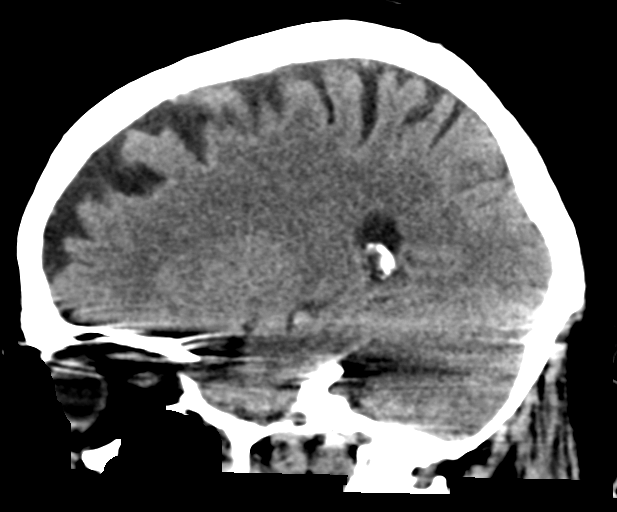
[im 26/51  brain]
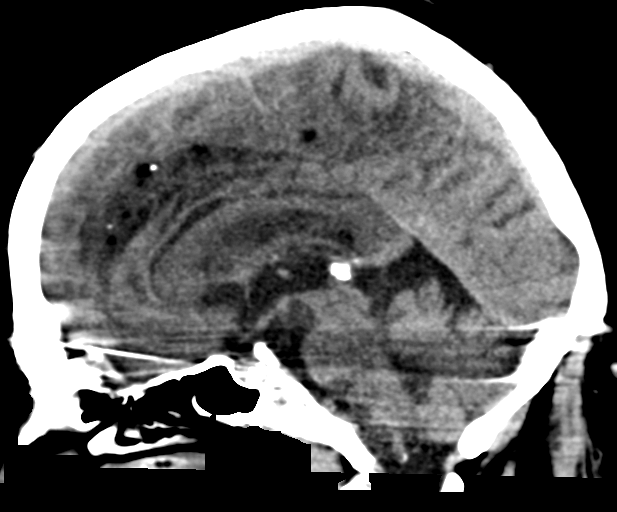
[im 34/51  brain]
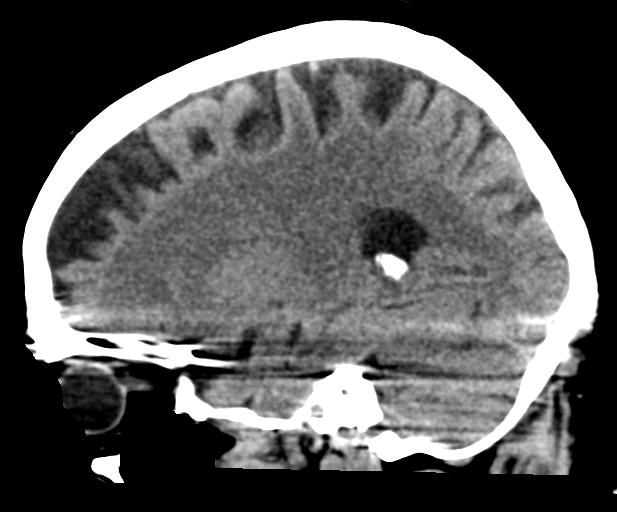

[16 of 47 positions shown; findings below may reference images not displayed]

FINDINGS: Motion degraded images.

Brain: No evidence of acute infarction, hemorrhage, hydrocephalus,
extra-axial collection or mass lesion/mass effect.

Vascular: Intracranial atherosclerosis.

Skull: Normal. Negative for fracture or focal lesion.

Sinuses/Orbits: The visualized paranasal sinuses are essentially
clear. The mastoid air cells are unopacified.

Other: None.
IMPRESSION: Motion degraded images.

No evidence of acute intracranial abnormality.

## 2020-06-27 IMAGING — DX PORTABLE CHEST - 1 VIEW
1 series · 1 of 1 positions shown · non-contrast
Comparison: Portable chest 03/05/2019 and earlier.

CLINICAL DATA: 73-year-old female with progressive shortness of
breath.

EXAM:
PORTABLE CHEST 1 VIEW

[chest ap]
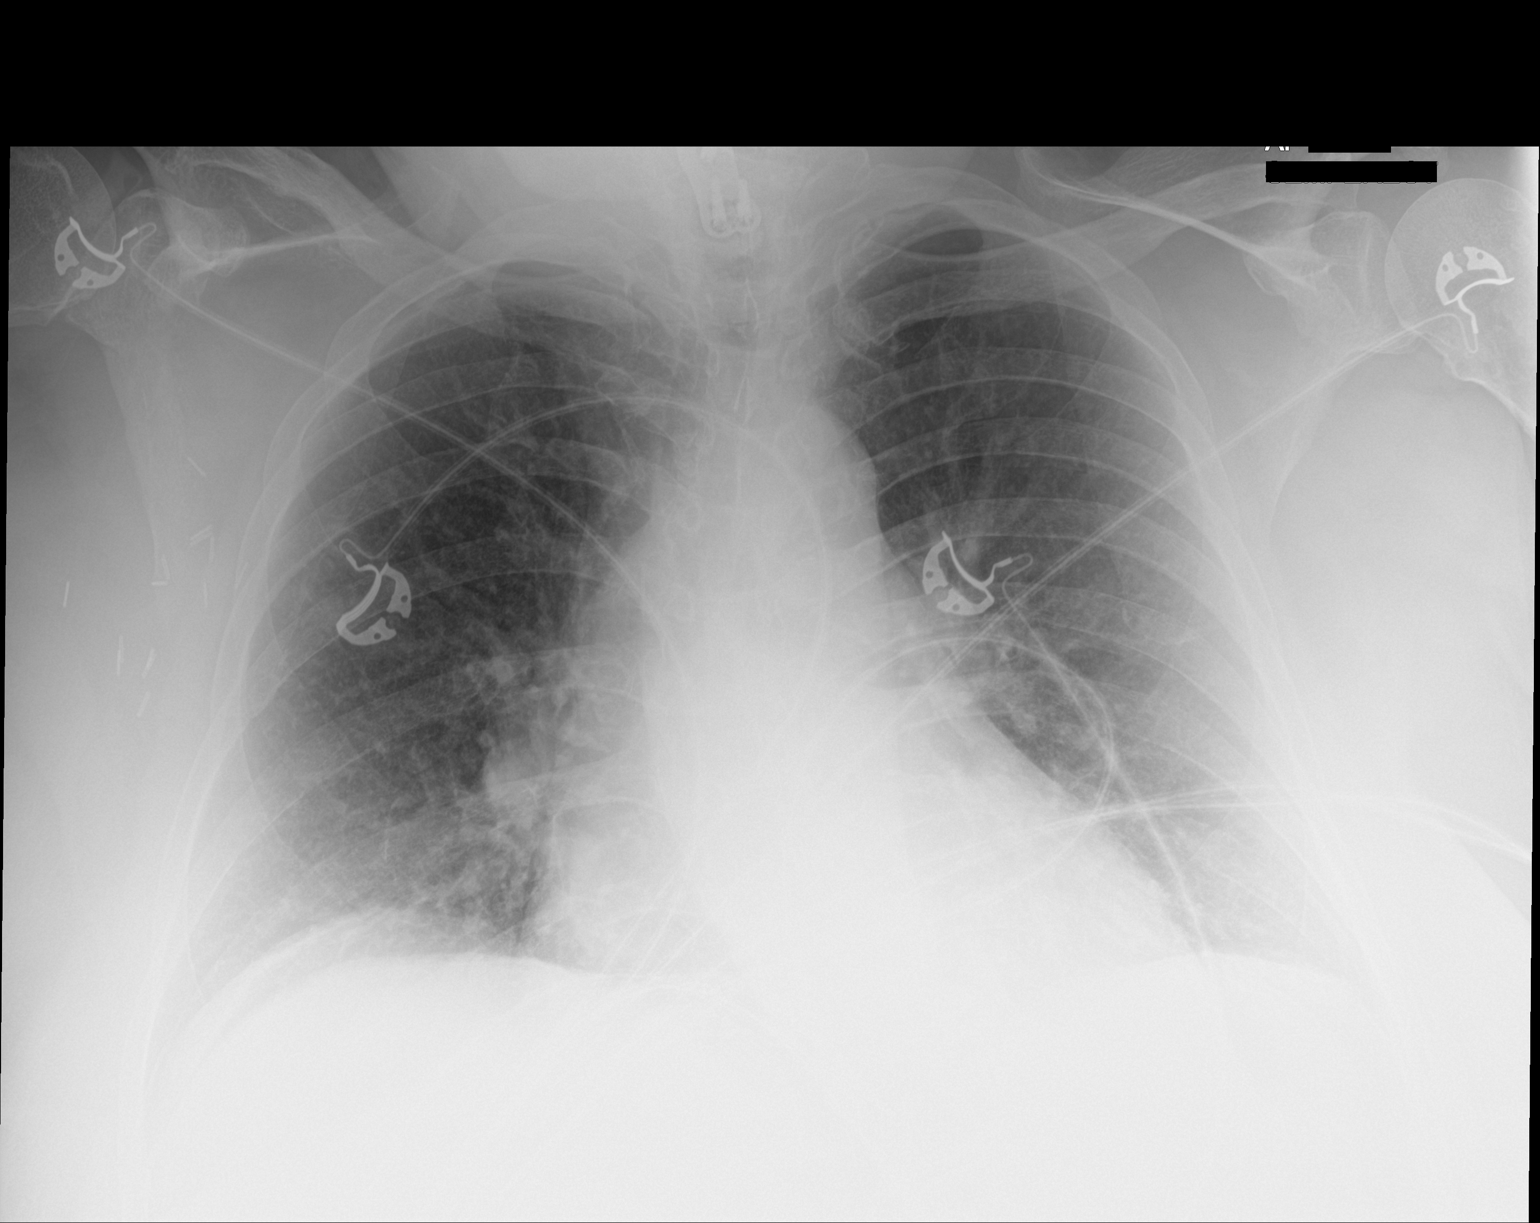

[1 of 1 positions shown; findings below may reference images not displayed]

FINDINGS: Portable AP semi upright view at 7434 hours. Stable lung volumes.
Mediastinal contours remain within normal limits. Visualized
tracheal air column is within normal limits. Allowing for portable
technique the lungs are clear. No pneumothorax. Chronic right
axillary surgical clips. Prior cervical ACDF.
IMPRESSION: No acute cardiopulmonary abnormality.

## 2020-06-28 DIAGNOSIS — I10 Essential (primary) hypertension: Secondary | ICD-10-CM | POA: Diagnosis not present

## 2020-06-28 DIAGNOSIS — J9620 Acute and chronic respiratory failure, unspecified whether with hypoxia or hypercapnia: Secondary | ICD-10-CM | POA: Diagnosis not present

## 2020-06-28 DIAGNOSIS — J441 Chronic obstructive pulmonary disease with (acute) exacerbation: Secondary | ICD-10-CM | POA: Diagnosis not present

## 2020-06-29 ENCOUNTER — Other Ambulatory Visit: Payer: Self-pay | Admitting: Family Medicine

## 2020-06-29 DIAGNOSIS — J441 Chronic obstructive pulmonary disease with (acute) exacerbation: Secondary | ICD-10-CM | POA: Diagnosis not present

## 2020-06-29 DIAGNOSIS — I10 Essential (primary) hypertension: Secondary | ICD-10-CM | POA: Diagnosis not present

## 2020-06-29 DIAGNOSIS — J9620 Acute and chronic respiratory failure, unspecified whether with hypoxia or hypercapnia: Secondary | ICD-10-CM | POA: Diagnosis not present

## 2020-07-01 ENCOUNTER — Ambulatory Visit (HOSPITAL_COMMUNITY): Payer: Medicare Other

## 2020-07-03 ENCOUNTER — Encounter (INDEPENDENT_AMBULATORY_CARE_PROVIDER_SITE_OTHER): Payer: Medicare Other | Admitting: Ophthalmology

## 2020-07-03 ENCOUNTER — Other Ambulatory Visit: Payer: Self-pay

## 2020-07-03 DIAGNOSIS — H43813 Vitreous degeneration, bilateral: Secondary | ICD-10-CM

## 2020-07-03 DIAGNOSIS — H35033 Hypertensive retinopathy, bilateral: Secondary | ICD-10-CM

## 2020-07-03 DIAGNOSIS — I10 Essential (primary) hypertension: Secondary | ICD-10-CM

## 2020-07-03 DIAGNOSIS — H353231 Exudative age-related macular degeneration, bilateral, with active choroidal neovascularization: Secondary | ICD-10-CM

## 2020-07-04 DIAGNOSIS — I10 Essential (primary) hypertension: Secondary | ICD-10-CM | POA: Diagnosis not present

## 2020-07-04 DIAGNOSIS — J441 Chronic obstructive pulmonary disease with (acute) exacerbation: Secondary | ICD-10-CM | POA: Diagnosis not present

## 2020-07-04 DIAGNOSIS — J9620 Acute and chronic respiratory failure, unspecified whether with hypoxia or hypercapnia: Secondary | ICD-10-CM | POA: Diagnosis not present

## 2020-07-06 ENCOUNTER — Telehealth (INDEPENDENT_AMBULATORY_CARE_PROVIDER_SITE_OTHER): Payer: Medicare Other | Admitting: Family Medicine

## 2020-07-06 ENCOUNTER — Encounter: Payer: Self-pay | Admitting: Family Medicine

## 2020-07-06 DIAGNOSIS — R05 Cough: Secondary | ICD-10-CM

## 2020-07-06 DIAGNOSIS — R058 Other specified cough: Secondary | ICD-10-CM | POA: Insufficient documentation

## 2020-07-06 HISTORY — DX: Other specified cough: R05.8

## 2020-07-06 NOTE — Assessment & Plan Note (Addendum)
Patient has a history of respiratory failure, sleep apnea, reflux and vocal cord dysfunction. She reports cough with productive sputum, though she is unsure of the quality as she swallows it back, sore throat, and mild shortness of breath. She has had her COVID vaccine and reports no known sick contacts. Return precautions include: worsening sputum production, increased oxygen requirement, sudden difficulty breathing, increased leg swelling, chest pain, confusion, fever >100.65F. --DDX: viral respiratory illness, COVID, worsening respiratory failure, PNA --[place] COVID testing information given, recommend test --Honey and tea for sore throat --Given Return Precautions

## 2020-07-06 NOTE — Progress Notes (Signed)
[facility] Visit  Patient consented to have virtual visit and was identified by name and date of birth. Method of visit: Telephone  Encounter participants: Patient: Martha Mora - located at daughter's home Provider: Rise Patience - located at [facility] clinic Others (if applicable):   Chief Complaint: Sore throat and cough  HPI: Patient reports that she has a 4-5 day history of cough, dry and sore throat, and "laryngitis". She normally doesn't get "laryngitis or colds this bad". She reports mild dyspnea on exertion and has oxygen at home that she has had to use more often since this onset of sickness. She has nasal and chest congestion with productive sputum but doesn't know the color as she reports she swallows it back. She had an episode of diarrhea this morning and thinks she "ate the wrong stuff" though she only had an omlette with cheese; she took immodium and the diarrhea resolved. She has no other complaints today and wants to know if she needs an antibiotic.  Patient reports that she has dizziness, but 4 weeks ago she had an episode of dizziness with position change that prompted her to make an appointment and is going to be followed by ENT. She does not feel that this has worsened with the onset of this sickness.    Patient reports no recent sick contacts and has had her COVID vaccine. She normally lives with her son, but is currently staying with her daughter's family (son-in-law, and 2 grandchildren) while her son is out of town.   Denies: HA, vomiting, ear pain, leg swellings   ROS: per HPI  Pertinent PMHx: Chronic respiratory failure, hx of CAP, reflux esophagitis, hx of tobacco use, vocal cord dysfunction.  Exam:  Temp 98.8 F (37.1 C)   Wt 233 lb (105.7 kg)   BMI 44.02 kg/m   Respiratory: not coughing during the conversation, voice is raspy, speaking in full sentences, NAD  Assessment/Plan:  At this moment, I do not assess a  need for antibiotics at this time, return precautions given to patient. Advised patient to make an appointment with the office if this does not get better over the weekend. If her condition worsens, patient is to go to the ER due to her history of respiratory failure and many complicating medical conditions.  Cough with sputum Patient has a history of respiratory failure, sleep apnea, reflux and vocal cord dysfunction. She reports cough with productive sputum, though she is unsure of the quality as she swallows it back, sore throat, and mild shortness of breath. She has had her COVID vaccine and reports no known sick contacts. Return precautions include: worsening sputum production, increased oxygen requirement, sudden difficulty breathing, increased leg swelling, chest pain, confusion, fever >100.97F. --DDX: viral respiratory illness, COVID, worsening respiratory failure, PNA -- COVID testing information given, recommend test --Honey and tea for sore throat --Given Return Precautions    Time spent during visit with patient: 17 minutes  Rise Patience, DO PGY [facility]

## 2020-07-08 ENCOUNTER — Other Ambulatory Visit: Payer: Self-pay

## 2020-07-08 ENCOUNTER — Encounter (HOSPITAL_COMMUNITY): Payer: Self-pay

## 2020-07-08 ENCOUNTER — Ambulatory Visit (HOSPITAL_COMMUNITY)
Admission: EM | Admit: 2020-07-08 | Discharge: 2020-07-08 | Disposition: A | Discharge status: Home / Self Care | Payer: Medicare Other | Attending: Emergency Medicine | Admitting: Emergency Medicine

## 2020-07-08 ENCOUNTER — Ambulatory Visit (HOSPITAL_COMMUNITY): Payer: Medicare Other

## 2020-07-08 DIAGNOSIS — M810 Age-related osteoporosis without current pathological fracture: Secondary | ICD-10-CM | POA: Insufficient documentation

## 2020-07-08 DIAGNOSIS — J029 Acute pharyngitis, unspecified: Secondary | ICD-10-CM | POA: Diagnosis not present

## 2020-07-08 DIAGNOSIS — Z79899 Other long term (current) drug therapy: Secondary | ICD-10-CM | POA: Diagnosis not present

## 2020-07-08 DIAGNOSIS — Z881 Allergy status to other antibiotic agents status: Secondary | ICD-10-CM | POA: Diagnosis not present

## 2020-07-08 DIAGNOSIS — F419 Anxiety disorder, unspecified: Secondary | ICD-10-CM | POA: Insufficient documentation

## 2020-07-08 DIAGNOSIS — I10 Essential (primary) hypertension: Secondary | ICD-10-CM | POA: Diagnosis not present

## 2020-07-08 DIAGNOSIS — Z87442 Personal history of urinary calculi: Secondary | ICD-10-CM | POA: Insufficient documentation

## 2020-07-08 DIAGNOSIS — J069 Acute upper respiratory infection, unspecified: Secondary | ICD-10-CM | POA: Diagnosis not present

## 2020-07-08 DIAGNOSIS — Z8701 Personal history of pneumonia (recurrent): Secondary | ICD-10-CM | POA: Insufficient documentation

## 2020-07-08 DIAGNOSIS — Z20822 Contact with and (suspected) exposure to covid-19: Secondary | ICD-10-CM | POA: Diagnosis not present

## 2020-07-08 DIAGNOSIS — G2581 Restless legs syndrome: Secondary | ICD-10-CM | POA: Insufficient documentation

## 2020-07-08 DIAGNOSIS — G4733 Obstructive sleep apnea (adult) (pediatric): Secondary | ICD-10-CM | POA: Diagnosis not present

## 2020-07-08 DIAGNOSIS — Z955 Presence of coronary angioplasty implant and graft: Secondary | ICD-10-CM | POA: Diagnosis not present

## 2020-07-08 DIAGNOSIS — J449 Chronic obstructive pulmonary disease, unspecified: Secondary | ICD-10-CM | POA: Insufficient documentation

## 2020-07-08 DIAGNOSIS — Z9049 Acquired absence of other specified parts of digestive tract: Secondary | ICD-10-CM | POA: Insufficient documentation

## 2020-07-08 DIAGNOSIS — F17290 Nicotine dependence, other tobacco product, uncomplicated: Secondary | ICD-10-CM | POA: Diagnosis not present

## 2020-07-08 DIAGNOSIS — Z8249 Family history of ischemic heart disease and other diseases of the circulatory system: Secondary | ICD-10-CM | POA: Diagnosis not present

## 2020-07-08 DIAGNOSIS — E785 Hyperlipidemia, unspecified: Secondary | ICD-10-CM | POA: Diagnosis not present

## 2020-07-08 DIAGNOSIS — Z888 Allergy status to other drugs, medicaments and biological substances status: Secondary | ICD-10-CM | POA: Insufficient documentation

## 2020-07-08 DIAGNOSIS — Z7901 Long term (current) use of anticoagulants: Secondary | ICD-10-CM | POA: Diagnosis not present

## 2020-07-08 DIAGNOSIS — K219 Gastro-esophageal reflux disease without esophagitis: Secondary | ICD-10-CM | POA: Diagnosis not present

## 2020-07-08 DIAGNOSIS — I252 Old myocardial infarction: Secondary | ICD-10-CM | POA: Insufficient documentation

## 2020-07-08 DIAGNOSIS — Z87440 Personal history of urinary (tract) infections: Secondary | ICD-10-CM | POA: Insufficient documentation

## 2020-07-08 DIAGNOSIS — I2510 Atherosclerotic heart disease of native coronary artery without angina pectoris: Secondary | ICD-10-CM | POA: Insufficient documentation

## 2020-07-08 DIAGNOSIS — I4891 Unspecified atrial fibrillation: Secondary | ICD-10-CM | POA: Diagnosis not present

## 2020-07-08 DIAGNOSIS — Z880 Allergy status to penicillin: Secondary | ICD-10-CM | POA: Insufficient documentation

## 2020-07-08 MED ORDER — ONDANSETRON 4 MG PO TBDP
ORAL_TABLET | ORAL | Status: AC
  Filled 2020-07-08: qty 1

## 2020-07-08 MED ORDER — IPRATROPIUM BROMIDE 0.06 % NA SOLN
2.0000 | Freq: Three times a day (TID) | NASAL | 12 refills | Status: DC | PRN
Start: 2020-07-08 — End: 2020-11-21

## 2020-07-08 MED ORDER — ONDANSETRON 4 MG PO TBDP
4.0000 mg | ORAL_TABLET | Freq: Once | ORAL | Status: AC
  Administered 2020-07-08: 4 mg via ORAL

## 2020-07-08 NOTE — ED Triage Notes (Signed)
Pt c/o sore throat, hoarseness, non-prouductive cough, congestion, and mild SOB, HA for past 5 days, nausea onset today. Pt has been experiencing dizziness for approx 4 weeks and was evaluated for same in the hospital and has an MRI scheduled next month for eval. Pt reports dizziness has improved somewhat. Denies v/d, CP or other complaint. Pt able to ambulate using walker without significant increase in WOB.   Bilateral lung sounds CTA. Pt states that "her Eliquis wasn't refilled recently."
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
## 2020-07-08 NOTE — Discharge Instructions (Signed)
Self isolate until covid results are back and negative.  Will notify you by phone of any positive findings. Your negative results will be sent through your MyChart.     Throat lozenges, gargles, chloraseptic spray, warm teas, popsicles etc to help with throat pain.  Tylenol as needed.  Mucinex as an expectorant may be helpful as well.  Nasal spray as needed for congestion/ nasal drainage/ post nasal drip.  Please return for any worsening of symptoms.  Please follow up with your ENT as scheduled.

## 2020-07-09 LAB — SARS CORONAVIRUS 2 (TAT 6-24 HRS): SARS Coronavirus 2: NEGATIVE

## 2020-07-10 ENCOUNTER — Ambulatory Visit: Payer: Medicare Other | Admitting: Family Medicine

## 2020-07-17 ENCOUNTER — Ambulatory Visit
Admission: RE | Admit: 2020-07-17 | Discharge: 2020-07-17 | Disposition: A | Discharge status: Home / Self Care | Payer: Medicare Other | Source: Ambulatory Visit | Attending: Family Medicine | Admitting: Family Medicine

## 2020-07-17 ENCOUNTER — Telehealth: Payer: Self-pay

## 2020-07-17 ENCOUNTER — Other Ambulatory Visit: Payer: Self-pay

## 2020-07-17 DIAGNOSIS — R42 Dizziness and giddiness: Secondary | ICD-10-CM

## 2020-07-17 DIAGNOSIS — F419 Anxiety disorder, unspecified: Secondary | ICD-10-CM

## 2020-07-17 MED ORDER — LORAZEPAM 1 MG PO TABS: 1.0000 mg | ORAL_TABLET | Freq: Once | ORAL | 0 refills | Status: DC

## 2020-07-17 MED ORDER — LORAZEPAM 1 MG PO TABS: 1.0000 mg | ORAL_TABLET | Freq: Two times a day (BID) | ORAL | 0 refills | Status: DC | PRN

## 2020-07-17 NOTE — Telephone Encounter (Signed)
Called and spoke with Lauri at [facility]. Per MRI technician, medication is typically sent into pharmacy with the following instructions. Take 1 pill 45 minutes prior to imaging and second pill if needed 30 minutes before imaging.   They are unable to dose patient at site. PCP has to send in anti anxiety medication if warranted.   Talbot Grumbling, RN

## 2020-07-17 NOTE — Telephone Encounter (Signed)
Patient calls nurse line regarding receiving diazepam for MRI at 1730 this evening. Patient reports that she is having a lot of anxiety regarding imaging and would like rx [facility] [address], if appropriate.   To PCP  Please advise  Talbot Grumbling, RN

## 2020-07-21 NOTE — Telephone Encounter (Signed)
I attempted to call patient to see if she was able to go through with MRI. I can not figure out if she did or if its scheduled for future. However, I had to leave VM.

## 2020-07-25 NOTE — Telephone Encounter (Signed)
Patient returns my call. Patient reports she could not got through with MRI. Patient reports the Lorazepam did not help and she "freaked" out had to leave. Patient is calling around to see which imaging places are not "full closure" with their machines. Patient let us know if she finds a place. Will forward to PCP as a FYI.

## 2020-07-27 NOTE — Telephone Encounter (Signed)
Patient returns call to nurse line stating that she needs a referral to [facility], as they have an "open" imaging machine.   Please advise if referral can be placed  To PCP  Talbot Grumbling, RN

## 2020-08-07 ENCOUNTER — Encounter (INDEPENDENT_AMBULATORY_CARE_PROVIDER_SITE_OTHER): Payer: Medicare Other | Admitting: Ophthalmology

## 2020-08-08 DIAGNOSIS — R42 Dizziness and giddiness: Secondary | ICD-10-CM | POA: Diagnosis not present

## 2020-08-08 DIAGNOSIS — R2689 Other abnormalities of gait and mobility: Secondary | ICD-10-CM | POA: Diagnosis not present

## 2020-08-11 DIAGNOSIS — H903 Sensorineural hearing loss, bilateral: Secondary | ICD-10-CM | POA: Diagnosis not present

## 2020-08-11 DIAGNOSIS — H8111 Benign paroxysmal vertigo, right ear: Secondary | ICD-10-CM | POA: Diagnosis not present

## 2020-08-14 ENCOUNTER — Encounter (INDEPENDENT_AMBULATORY_CARE_PROVIDER_SITE_OTHER): Payer: Medicare Other | Admitting: Ophthalmology

## 2020-08-14 ENCOUNTER — Other Ambulatory Visit: Payer: Self-pay

## 2020-08-14 DIAGNOSIS — H43813 Vitreous degeneration, bilateral: Secondary | ICD-10-CM | POA: Diagnosis not present

## 2020-08-14 DIAGNOSIS — H35033 Hypertensive retinopathy, bilateral: Secondary | ICD-10-CM | POA: Diagnosis not present

## 2020-08-14 DIAGNOSIS — H33302 Unspecified retinal break, left eye: Secondary | ICD-10-CM

## 2020-08-14 DIAGNOSIS — I10 Essential (primary) hypertension: Secondary | ICD-10-CM

## 2020-08-14 DIAGNOSIS — H353231 Exudative age-related macular degeneration, bilateral, with active choroidal neovascularization: Secondary | ICD-10-CM

## 2020-08-14 MED ORDER — DULOXETINE HCL 60 MG PO CPEP
60.0000 mg | ORAL_CAPSULE | Freq: Every day | ORAL | 1 refills | Status: DC
Start: 2020-08-14 — End: 2020-11-21

## 2020-08-28 ENCOUNTER — Telehealth: Payer: Self-pay

## 2020-08-28 NOTE — Telephone Encounter (Signed)
I spoke with Martha Mora and relayed Martha Mora's comments and recommendations.  Martha Mora verbalized understanding.  She agrees to come in now for lab appt and prolia and then see Martha Mora in December 2021.  Scheduling message sent

## 2020-08-28 NOTE — Telephone Encounter (Signed)
-----   Message from Alla Feeling, NP sent at 08/23/2020 11:50 AM EDT ----- Please let her know her DEXA from this summer continues to show osteopenia, but same as 2018 no progression. Continue prolia, she is overdue for an injection. Please schedule this month if she agrees.   Thanks, Regan Rakers

## 2020-08-29 ENCOUNTER — Telehealth: Payer: Self-pay | Admitting: Hematology

## 2020-08-29 NOTE — Telephone Encounter (Signed)
Scheduled appointments per 10/11 scheduling message. Spoke to patient who is aware of appointment date and time.

## 2020-09-01 ENCOUNTER — Telehealth: Payer: Self-pay | Admitting: Nurse Practitioner

## 2020-09-01 ENCOUNTER — Ambulatory Visit: Payer: Medicare Other

## 2020-09-01 ENCOUNTER — Other Ambulatory Visit: Payer: Medicare Other

## 2020-09-01 NOTE — Telephone Encounter (Signed)
Called pt per 10/15 sch msg - no answer and no vmail.

## 2020-09-11 ENCOUNTER — Encounter (INDEPENDENT_AMBULATORY_CARE_PROVIDER_SITE_OTHER): Payer: Medicare Other | Admitting: Ophthalmology

## 2020-09-12 DIAGNOSIS — H8113 Benign paroxysmal vertigo, bilateral: Secondary | ICD-10-CM | POA: Diagnosis not present

## 2020-09-13 ENCOUNTER — Encounter (INDEPENDENT_AMBULATORY_CARE_PROVIDER_SITE_OTHER): Payer: Medicare Other | Admitting: Ophthalmology

## 2020-09-14 ENCOUNTER — Telehealth: Payer: Self-pay | Admitting: *Deleted

## 2020-09-14 DIAGNOSIS — F419 Anxiety disorder, unspecified: Secondary | ICD-10-CM

## 2020-09-14 NOTE — Telephone Encounter (Signed)
Patient called requesting her MRI order be faxed to [facility] as they offer the open MRI that she is needing due to her extreme anxiety.  I called patient and had to leave a message to let her know that I will fax the order over.  Asked her to call me back with whether or not she picked up the medication sent in for her last MRI appt in august.  Will wait to hear from her before sending over another script.  Joeziah Voit,CMA

## 2020-09-15 MED ORDER — LORAZEPAM 1 MG PO TABS: 1.0000 mg | ORAL_TABLET | Freq: Two times a day (BID) | ORAL | 0 refills | Status: DC | PRN

## 2020-09-15 NOTE — Telephone Encounter (Signed)
Patient returns call to nurse line stating that medication did not work and that she will need a stronger medication for MRI.  To PCP  Talbot Grumbling, RN

## 2020-09-15 NOTE — Telephone Encounter (Signed)
Will forward to MD regarding medication need for MRI scan.  Kris No,CMA

## 2020-09-18 ENCOUNTER — Encounter (INDEPENDENT_AMBULATORY_CARE_PROVIDER_SITE_OTHER): Payer: Medicare Other | Admitting: Ophthalmology

## 2020-09-18 NOTE — Telephone Encounter (Signed)
LM for patient ok per DPR with instructions from MD on medication directions.  Ernie Sagrero,CMA

## 2020-10-05 ENCOUNTER — Encounter (INDEPENDENT_AMBULATORY_CARE_PROVIDER_SITE_OTHER): Payer: Medicare Other | Admitting: Ophthalmology

## 2020-10-09 DIAGNOSIS — R42 Dizziness and giddiness: Secondary | ICD-10-CM | POA: Diagnosis not present

## 2020-10-18 ENCOUNTER — Encounter (INDEPENDENT_AMBULATORY_CARE_PROVIDER_SITE_OTHER): Payer: Medicare Other | Admitting: Ophthalmology

## 2020-10-18 ENCOUNTER — Other Ambulatory Visit: Payer: Self-pay

## 2020-10-18 DIAGNOSIS — I10 Essential (primary) hypertension: Secondary | ICD-10-CM

## 2020-10-18 DIAGNOSIS — H353231 Exudative age-related macular degeneration, bilateral, with active choroidal neovascularization: Secondary | ICD-10-CM | POA: Diagnosis not present

## 2020-10-18 DIAGNOSIS — H43813 Vitreous degeneration, bilateral: Secondary | ICD-10-CM

## 2020-10-18 DIAGNOSIS — H33302 Unspecified retinal break, left eye: Secondary | ICD-10-CM

## 2020-10-18 DIAGNOSIS — H35033 Hypertensive retinopathy, bilateral: Secondary | ICD-10-CM | POA: Diagnosis not present

## 2020-10-19 ENCOUNTER — Inpatient Hospital Stay: Payer: Medicare Other

## 2020-10-19 ENCOUNTER — Inpatient Hospital Stay: Payer: Medicare Other | Attending: Nurse Practitioner

## 2020-10-19 ENCOUNTER — Other Ambulatory Visit: Payer: Self-pay

## 2020-10-19 ENCOUNTER — Encounter: Payer: Self-pay | Admitting: Nurse Practitioner

## 2020-10-19 ENCOUNTER — Inpatient Hospital Stay (HOSPITAL_BASED_OUTPATIENT_CLINIC_OR_DEPARTMENT_OTHER): Payer: Medicare Other | Admitting: Nurse Practitioner

## 2020-10-19 VITALS — BP 116/54 | HR 62 | Resp 17 | Ht 61.0 in | Wt 231.2 lb

## 2020-10-19 DIAGNOSIS — M8580 Other specified disorders of bone density and structure, unspecified site: Secondary | ICD-10-CM | POA: Diagnosis not present

## 2020-10-19 DIAGNOSIS — Z170 Estrogen receptor positive status [ER+]: Secondary | ICD-10-CM | POA: Diagnosis not present

## 2020-10-19 DIAGNOSIS — C50511 Malignant neoplasm of lower-outer quadrant of right female breast: Secondary | ICD-10-CM

## 2020-10-19 LAB — COMPREHENSIVE METABOLIC PANEL
ALT: 19 U/L (ref 0–44)
AST: 42 U/L — ABNORMAL HIGH (ref 15–41)
Albumin: 3.5 g/dL (ref 3.5–5.0)
Alkaline Phosphatase: 105 U/L (ref 38–126)
Anion gap: 9 (ref 5–15)
BUN: 12 mg/dL (ref 8–23)
CO2: 25 mmol/L (ref 22–32)
Calcium: 9.4 mg/dL (ref 8.9–10.3)
Chloride: 108 mmol/L (ref 98–111)
Creatinine, Ser: 0.8 mg/dL (ref 0.44–1.00)
GFR, Estimated: >60 mL/min (ref >60)
Glucose, Bld: 138 mg/dL — ABNORMAL HIGH (ref 70–99)
Potassium: 4.3 mmol/L (ref 3.5–5.1)
Sodium: 142 mmol/L (ref 135–145)
Total Bilirubin: 0.5 mg/dL (ref 0.3–1.2)
Total Protein: 6.7 g/dL (ref 6.5–8.1)

## 2020-10-19 LAB — CBC WITH DIFFERENTIAL/PLATELET
Abs Immature Granulocytes: 0.05 10*3/uL (ref 0.00–0.07)
Basophils Absolute: 0.1 10*3/uL (ref 0.0–0.1)
Basophils Relative: 1 %
Eosinophils Absolute: 0.4 10*3/uL (ref 0.0–0.5)
Eosinophils Relative: 3 %
HCT: 44 % (ref 36.0–46.0)
Hemoglobin: 13.7 g/dL (ref 12.0–15.0)
Immature Granulocytes: 0 %
Lymphocytes Relative: 14 %
Lymphs Abs: 1.7 10*3/uL (ref 0.7–4.0)
MCH: 28.6 pg (ref 26.0–34.0)
MCHC: 31.1 g/dL (ref 30.0–36.0)
MCV: 91.9 fL (ref 80.0–100.0)
Monocytes Absolute: 0.8 10*3/uL (ref 0.1–1.0)
Monocytes Relative: 6 %
Neutro Abs: 9.7 10*3/uL — ABNORMAL HIGH (ref 1.7–7.7)
Neutrophils Relative %: 76 %
Platelets: 292 10*3/uL (ref 150–400)
RBC: 4.79 MIL/uL (ref 3.87–5.11)
RDW: 16.5 % — ABNORMAL HIGH (ref 11.5–15.5)
WBC: 12.8 10*3/uL — ABNORMAL HIGH (ref 4.0–10.5)
nRBC: 0 % (ref 0.0–0.2)

## 2020-10-19 LAB — VITAMIN D 25 HYDROXY (VIT D DEFICIENCY, FRACTURES): Vit D, 25-Hydroxy: 20.42 ng/mL — ABNORMAL LOW (ref 30–100)

## 2020-10-19 MED ORDER — DENOSUMAB 60 MG/ML ~~LOC~~ SOSY
60.0000 mg | PREFILLED_SYRINGE | Freq: Once | SUBCUTANEOUS | Status: AC
  Administered 2020-10-19: 60 mg via SUBCUTANEOUS

## 2020-10-19 MED ORDER — DENOSUMAB 60 MG/ML ~~LOC~~ SOSY
PREFILLED_SYRINGE | SUBCUTANEOUS | Status: AC
  Filled 2020-10-19: qty 1
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
## 2020-10-19 NOTE — Patient Instructions (Signed)
Denosumab injection What is this medicine? DENOSUMAB (den oh sue mab) slows bone breakdown. Prolia is used to treat osteoporosis in women after menopause and in men, and in people who are taking corticosteroids for 6 months or more. Xgeva is used to treat a high calcium level due to cancer and to prevent bone fractures and other bone problems caused by multiple myeloma or cancer bone metastases. Xgeva is also used to treat giant cell tumor of the bone. This medicine may be used for other purposes; ask your health care provider or pharmacist if you have questions. COMMON BRAND NAME(S): Prolia, XGEVA What should I tell my health care provider before I take this medicine? They need to know if you have any of these conditions:  dental disease  having surgery or tooth extraction  infection  kidney disease  low levels of calcium or Vitamin D in the blood  malnutrition  on hemodialysis  skin conditions or sensitivity  thyroid or parathyroid disease  an unusual reaction to denosumab, other medicines, foods, dyes, or preservatives  pregnant or trying to get pregnant  breast-feeding How should I use this medicine? This medicine is for injection under the skin. It is given by a health care professional in a hospital or clinic setting. A special MedGuide will be given to you before each treatment. Be sure to read this information carefully each time. For Prolia, talk to your pediatrician regarding the use of this medicine in children. Special care may be needed. For Xgeva, talk to your pediatrician regarding the use of this medicine in children. While this drug may be prescribed for children as young as 13 years for selected conditions, precautions do apply. Overdosage: If you think you have taken too much of this medicine contact a poison control center or emergency room at once. NOTE: This medicine is only for you. Do not share this medicine with others. What if I miss a dose? It is  important not to miss your dose. Call your doctor or health care professional if you are unable to keep an appointment. What may interact with this medicine? Do not take this medicine with any of the following medications:  other medicines containing denosumab This medicine may also interact with the following medications:  medicines that lower your chance of fighting infection  steroid medicines like prednisone or cortisone This list may not describe all possible interactions. Give your health care provider a list of all the medicines, herbs, non-prescription drugs, or dietary supplements you use. Also tell them if you smoke, drink alcohol, or use illegal drugs. Some items may interact with your medicine. What should I watch for while using this medicine? Visit your doctor or health care professional for regular checks on your progress. Your doctor or health care professional may order blood tests and other tests to see how you are doing. Call your doctor or health care professional for advice if you get a fever, chills or sore throat, or other symptoms of a cold or flu. Do not treat yourself. This drug may decrease your body's ability to fight infection. Try to avoid being around people who are sick. You should make sure you get enough calcium and vitamin D while you are taking this medicine, unless your doctor tells you not to. Discuss the foods you eat and the vitamins you take with your health care professional. See your dentist regularly. Brush and floss your teeth as directed. Before you have any dental work done, tell your dentist you are   receiving this medicine. Do not become pregnant while taking this medicine or for 5 months after stopping it. Talk with your doctor or health care professional about your birth control options while taking this medicine. Women should inform their doctor if they wish to become pregnant or think they might be pregnant. There is a potential for serious side  effects to an unborn child. Talk to your health care professional or pharmacist for more information. What side effects may I notice from receiving this medicine? Side effects that you should report to your doctor or health care professional as soon as possible:  allergic reactions like skin rash, itching or hives, swelling of the face, lips, or tongue  bone pain  breathing problems  dizziness  jaw pain, especially after dental work  redness, blistering, peeling of the skin  signs and symptoms of infection like fever or chills; cough; sore throat; pain or trouble passing urine  signs of low calcium like fast heartbeat, muscle cramps or muscle pain; pain, tingling, numbness in the hands or feet; seizures  unusual bleeding or bruising  unusually weak or tired Side effects that usually do not require medical attention (report to your doctor or health care professional if they continue or are bothersome):  constipation  diarrhea  headache  joint pain  loss of appetite  muscle pain  runny nose  tiredness  upset stomach This list may not describe all possible side effects. Call your doctor for medical advice about side effects. You may report side effects to FDA at 1-800-FDA-1088. Where should I keep my medicine? This medicine is only given in a clinic, doctor's office, or other health care setting and will not be stored at home. NOTE: This sheet is a summary. It may not cover all possible information. If you have questions about this medicine, talk to your doctor, pharmacist, or health care provider.  2020 Elsevier/Gold Standard (2018-03-13 16:10:44)

## 2020-10-20 ENCOUNTER — Telehealth: Payer: Self-pay | Admitting: Hematology

## 2020-10-20 NOTE — Telephone Encounter (Signed)
Scheduled appointments per 12/2 los. Mailed updated calendar to patient

## 2020-11-06 ENCOUNTER — Other Ambulatory Visit: Payer: Self-pay

## 2020-11-06 MED ORDER — OMEPRAZOLE 40 MG PO CPDR: 40.0000 mg | DELAYED_RELEASE_CAPSULE | Freq: Every day | ORAL | 5 refills | Status: DC

## 2020-11-15 ENCOUNTER — Ambulatory Visit: Payer: Medicare Other | Admitting: Family Medicine

## 2020-11-18 ENCOUNTER — Other Ambulatory Visit: Payer: Self-pay | Admitting: Nurse Practitioner

## 2020-11-18 MED ORDER — ERGOCALCIFEROL 1.25 MG (50000 UT) PO CAPS: 50000.0000 [IU] | ORAL_CAPSULE | ORAL | 0 refills | Status: DC

## 2020-11-20 ENCOUNTER — Telehealth: Payer: Self-pay

## 2020-11-20 NOTE — Telephone Encounter (Signed)
Called the pt made her aware of lab result pt has seen message and already picked medication up but expressed appreciation with phone call

## 2020-11-20 NOTE — Telephone Encounter (Signed)
-----   Message from Pollyann Samples, NP sent at 11/18/2020 10:31 PM EST ----- Judeth Cornfield,  Please let her know she has vitamin D deficiency, and I recommend starting vid D 50,000 IU once per week. I called in Rx. Since we see her annually, her PCP may want to follow this.   Thanks, Lacie  ----- Message ----- From: Lorayne Marek, RN Sent: 11/15/2020   2:35 PM EST To: Pollyann Samples, NP, Marily Lente, RN  Clydie Braun, Have you or Clayborn Heron addressed this yet.  It is old so I am asking. Lorayne Marek, RN  ----- Message ----- From: Malachy Mood, MD Sent: 11/04/2020   7:34 PM EST To: [facility]  Lacie, Have you informed her low vitD level from last visit with you? Thanks   Malachy Mood

## 2020-11-21 ENCOUNTER — Ambulatory Visit (INDEPENDENT_AMBULATORY_CARE_PROVIDER_SITE_OTHER): Payer: Medicare Other | Admitting: Family Medicine

## 2020-11-21 ENCOUNTER — Other Ambulatory Visit: Payer: Self-pay

## 2020-11-21 ENCOUNTER — Encounter: Payer: Self-pay | Admitting: Family Medicine

## 2020-11-21 ENCOUNTER — Encounter (INDEPENDENT_AMBULATORY_CARE_PROVIDER_SITE_OTHER): Payer: Medicare Other | Admitting: Ophthalmology

## 2020-11-21 VITALS — BP 122/78 | HR 53

## 2020-11-21 DIAGNOSIS — R3915 Urgency of urination: Secondary | ICD-10-CM | POA: Diagnosis not present

## 2020-11-21 DIAGNOSIS — G2581 Restless legs syndrome: Secondary | ICD-10-CM | POA: Diagnosis not present

## 2020-11-21 DIAGNOSIS — G4700 Insomnia, unspecified: Secondary | ICD-10-CM

## 2020-11-21 LAB — POCT URINALYSIS DIP (MANUAL ENTRY)
Bilirubin, UA: NEGATIVE
Blood, UA: NEGATIVE
Glucose, UA: NEGATIVE mg/dL
Ketones, POC UA: NEGATIVE mg/dL
Leukocytes, UA: NEGATIVE
Nitrite, UA: NEGATIVE
Protein Ur, POC: NEGATIVE mg/dL
Spec Grav, UA: 1.02 (ref 1.010–1.025)
Urobilinogen, UA: 0.2 E.U./dL
pH, UA: 5.5 (ref 5.0–8.0)

## 2020-11-21 MED ORDER — PREGABALIN 75 MG PO CAPS: 75.0000 mg | ORAL_CAPSULE | Freq: Every day | ORAL | 0 refills | Status: DC

## 2020-11-21 MED ORDER — TRAZODONE HCL 100 MG PO TABS: 50.0000 mg | ORAL_TABLET | Freq: Every evening | ORAL | 1 refills | Status: DC | PRN

## 2020-11-21 NOTE — Patient Instructions (Signed)
It was great seeing you today!  Please check-out at the front desk before leaving the clinic. I'd like to see you back on 12/06/19 to get a better look at those spots on your chest/  Likely they will need to be treated (frozen) in office.   I will send you a MyChart message about your urine tests.   Ifyou need to be seen earlier than that for any new issues we're happy to fit you in, just give [place] a call!  Visit Remembers: - Stop by the pharmacy to pick up your prescriptions - Continue to work on your healthy eating habits and incorporating exercise into your daily life.    Regarding lab work today:  Due to recent changes in healthcare laws, you may see the results of your imaging and laboratory studies on MyChart before your provider has had a chance to review them.  I understand that in some cases there may be results that are confusing or concerning to you. Not all laboratory results come back in the same time frame and you may be waiting for multiple results in order to interpret others.  Please give [place] 72 hours in order for your provider to thoroughly review all the results before contacting the office for clarification of your results. If everything is normal, you will get a letter in the mail or a message in My Chart. Please give [place] a call if you do not hear from [place] after 2 weeks.  Please bring all of your medications with you to each visit.    If you haven't already, sign up for My Chart to have easy access to your labs results, and communication with your primary care physician.  Feel free to call with any questions or concerns at any time, at [phone].   Take care,  Dr. Katherina [facility]

## 2020-11-21 NOTE — Assessment & Plan Note (Addendum)
Currently exacerbated by her daughter's recent diagnosis of breast cancer. She has been worried for her daughter. Continue taking melatonin, increase to 6 mg.  Trazodone 100 mg at bedtime.  Sleep hygiene reviewed.

## 2020-11-21 NOTE — Assessment & Plan Note (Signed)
Restart pregabalin at a reduced dose 75 mg.  Titrate up as needed.  Be vigilant of side effects.

## 2020-11-21 NOTE — Assessment & Plan Note (Signed)
On chart review, patient has had several urine cultures with no growth.  Previous urine culture June 2020 grew Proteus that was sensitive to Keflex.   UA obtained was unremarkable.  Urine culture sent.  Treat only if urine culture positive.

## 2020-11-21 NOTE — Progress Notes (Signed)
   SUBJECTIVE:   CHIEF COMPLAINT / HPI:   Chief Complaint  Patient presents with  . Insomnia    Getting worse   . Dysuria     Martha Mora is a 75 y.o. female here for dysuria.   Dysuria: Pain urinating started 2 days ago. Patient reports some urgency, no blood in urine and no frequency. She has tried nothing for this. Reports no antibiotics in the last 30 days.  She has not had more than 3 UTIs in the last 12 months. Pt is post menopausal.  She requires urine check as she has had to be hospitalized for UTI in the past and does not want this to happen again.  Patient reports no CVA tenderness, no fevers, no vaginal discharge, or no mouth ulcers.       PERTINENT  PMH / PSH: reviewed and updated as appropriate   OBJECTIVE:   BP 122/78   Pulse (!) 53   SpO2 93%    GEN: pleasant female, in no acute distress  CV: bradycardia RESP: no increased work of breathing ABD: soft, nontender, nondistended. No CVA tenderness  SKIN: warm, dry, multiple irregularly-shaped lesions concerning for seborrheic keratoses NEURO: moves all extremities appropriately PSYCH: Normal affect, appropriate speech and behavior    ASSESSMENT/PLAN:   Restless leg syndrome Restart pregabalin at a reduced dose 75 mg.  Titrate up as needed.  Be vigilant of side effects.  Insomnia Currently exacerbated by her daughter's recent diagnosis of breast cancer. She has been worried for her daughter. Continue taking melatonin, increase to 6 mg.  Trazodone 100 mg at bedtime.  Sleep hygiene reviewed.  Urinary urgency On chart review, patient has had several urine cultures with no growth.  Previous urine culture June 2020 grew Proteus that was sensitive to Keflex.   UA obtained was unremarkable.  Urine culture sent.  Treat only if urine culture positive.    Follow-up for cryotherapy for what appears to be seborrheic keratoses on anterior chest wall.    Katha Cabal, DO PGY-2, [facility] 11/21/2020

## 2020-11-23 LAB — URINE CULTURE

## 2020-11-28 ENCOUNTER — Other Ambulatory Visit: Payer: Self-pay

## 2020-11-28 ENCOUNTER — Encounter (INDEPENDENT_AMBULATORY_CARE_PROVIDER_SITE_OTHER): Payer: Medicare Other | Admitting: Ophthalmology

## 2020-11-28 DIAGNOSIS — H33302 Unspecified retinal break, left eye: Secondary | ICD-10-CM | POA: Diagnosis not present

## 2020-11-28 DIAGNOSIS — H353231 Exudative age-related macular degeneration, bilateral, with active choroidal neovascularization: Secondary | ICD-10-CM

## 2020-11-28 DIAGNOSIS — H35033 Hypertensive retinopathy, bilateral: Secondary | ICD-10-CM

## 2020-11-28 DIAGNOSIS — H43813 Vitreous degeneration, bilateral: Secondary | ICD-10-CM | POA: Diagnosis not present

## 2020-11-28 DIAGNOSIS — I10 Essential (primary) hypertension: Secondary | ICD-10-CM

## 2020-11-30 ENCOUNTER — Other Ambulatory Visit: Payer: Self-pay

## 2020-12-02 MED ORDER — METOPROLOL TARTRATE 25 MG PO TABS: 12.5000 mg | ORAL_TABLET | Freq: Two times a day (BID) | ORAL | 1 refills | Status: DC

## 2020-12-05 ENCOUNTER — Ambulatory Visit: Payer: Medicare Other | Admitting: Family Medicine

## 2020-12-06 ENCOUNTER — Other Ambulatory Visit: Payer: Self-pay | Admitting: Family Medicine

## 2020-12-08 ENCOUNTER — Telehealth: Payer: Self-pay | Admitting: Family Medicine

## 2020-12-08 NOTE — Telephone Encounter (Signed)
Patient dropped of disc of MRI of her brain but it in Dr's box.  Call patient any questions [phone]

## 2020-12-13 ENCOUNTER — Encounter: Payer: Self-pay | Admitting: Family Medicine

## 2020-12-13 ENCOUNTER — Other Ambulatory Visit: Payer: Self-pay | Admitting: Family Medicine

## 2020-12-13 DIAGNOSIS — G4700 Insomnia, unspecified: Secondary | ICD-10-CM

## 2020-12-15 ENCOUNTER — Telehealth: Payer: Self-pay | Admitting: Family Medicine

## 2020-12-15 NOTE — Telephone Encounter (Signed)
LVM for patient stating Dr. Susa Simmonds is on vacation and will be able to discuss her MRI hopefully next week when she returns.   Gerlene Fee, DO 12/15/2020, 12:17 PM PGY-2, [place]

## 2020-12-19 DIAGNOSIS — M25551 Pain in right hip: Secondary | ICD-10-CM | POA: Diagnosis not present

## 2020-12-19 DIAGNOSIS — M7061 Trochanteric bursitis, right hip: Secondary | ICD-10-CM | POA: Diagnosis not present

## 2020-12-25 ENCOUNTER — Other Ambulatory Visit: Payer: Self-pay | Admitting: Family Medicine

## 2020-12-25 ENCOUNTER — Other Ambulatory Visit: Payer: Self-pay | Admitting: Nurse Practitioner

## 2020-12-26 ENCOUNTER — Encounter (INDEPENDENT_AMBULATORY_CARE_PROVIDER_SITE_OTHER): Payer: Medicare Other | Admitting: Ophthalmology

## 2020-12-27 NOTE — Telephone Encounter (Signed)
For your review. Gardiner Rhyme, RN

## 2021-01-08 ENCOUNTER — Other Ambulatory Visit: Payer: Self-pay

## 2021-01-08 ENCOUNTER — Encounter (INDEPENDENT_AMBULATORY_CARE_PROVIDER_SITE_OTHER): Payer: Medicare Other | Admitting: Ophthalmology

## 2021-01-08 DIAGNOSIS — I10 Essential (primary) hypertension: Secondary | ICD-10-CM

## 2021-01-08 DIAGNOSIS — H43813 Vitreous degeneration, bilateral: Secondary | ICD-10-CM

## 2021-01-08 DIAGNOSIS — H353231 Exudative age-related macular degeneration, bilateral, with active choroidal neovascularization: Secondary | ICD-10-CM

## 2021-01-08 DIAGNOSIS — H35033 Hypertensive retinopathy, bilateral: Secondary | ICD-10-CM

## 2021-01-09 ENCOUNTER — Encounter: Payer: Self-pay | Admitting: Family Medicine

## 2021-01-09 ENCOUNTER — Ambulatory Visit (INDEPENDENT_AMBULATORY_CARE_PROVIDER_SITE_OTHER): Payer: Medicare Other | Admitting: Family Medicine

## 2021-01-09 VITALS — BP 142/80 | HR 84 | Ht 61.0 in | Wt 234.0 lb

## 2021-01-09 DIAGNOSIS — E559 Vitamin D deficiency, unspecified: Secondary | ICD-10-CM | POA: Diagnosis not present

## 2021-01-09 DIAGNOSIS — L821 Other seborrheic keratosis: Secondary | ICD-10-CM | POA: Diagnosis not present

## 2021-01-09 DIAGNOSIS — I4891 Unspecified atrial fibrillation: Secondary | ICD-10-CM

## 2021-01-09 DIAGNOSIS — J9611 Chronic respiratory failure with hypoxia: Secondary | ICD-10-CM | POA: Diagnosis not present

## 2021-01-09 MED ORDER — ALBUTEROL SULFATE HFA 108 (90 BASE) MCG/ACT IN AERS: INHALATION_SPRAY | RESPIRATORY_TRACT | 2 refills | Status: DC

## 2021-01-09 MED ORDER — AMIODARONE HCL 200 MG PO TABS: 200.0000 mg | ORAL_TABLET | Freq: Every day | ORAL | 0 refills | Status: DC

## 2021-01-09 NOTE — Assessment & Plan Note (Addendum)
Patient with hx of COPD, chronic hypoxic respiratory failure, sleep apnea and vocal cord paralysis. Follows with pulmonology. Patient presented with O2 saturation of 84%. She was promptly placed on O2 and remained 95- 97%. Advised patient to use oxygen with acitivity. For mortality benefit should use at least 16 hour/day.    Handicap placard form completed.

## 2021-01-09 NOTE — Assessment & Plan Note (Signed)
Dec 2021 Vit D level low. Patient taking weekly supplementation. Repeat Vit D in 3 months.

## 2021-01-09 NOTE — Patient Instructions (Addendum)
It was great seeing you today!   I'd like to see you back in 4-6 weeks for additional freezing but if you need to be seen earlier than that for any new issues we're happy to fit you in, just give [place] a call!  Your oxygen level was low when it was first checked today.  It is important for you to use your oxygen with activity. I completed paperwork for you to have a handicap placard for your car.   If you have questions or concerns please do not hesitate to call at [phone].  Dr. Rushie [facility]   Seborrheic Keratosis A seborrheic keratosis is a common, noncancerous (benign) skin growth. These growths are velvety, waxy, rough, tan, brown, or black spots that appear on the skin. These skin growths can be flat or raised, and scaly. What are the causes? The cause of this condition is not known. What increases the risk? You are more likely to develop this condition if you:  Have a family history of seborrheic keratosis.  Are 50 or older.  Are pregnant.  Have had estrogen replacement therapy. What are the signs or symptoms? Symptoms of this condition include growths on the face, chest, shoulders, back, or other areas. These growths:  Are usually painless, but may become irritated and itchy.  Can be yellow, brown, black, or other colors.  Are slightly raised or have a flat surface.  Are sometimes rough or wart-like in texture.  Are often velvety or waxy on the surface.  Are round or oval-shaped.  Often occur in groups, but may occur as a single growth.   How is this diagnosed? This condition is diagnosed with a medical history and physical exam.  A sample of the growth may be tested (skin biopsy).  You may need to see a skin specialist (dermatologist). How is this treated? Treatment is not usually needed for this condition, unless the growths are irritated or bleed often.  You may also choose to have the growths removed if you do not like their  appearance. ? Most commonly, these growths are treated with a procedure in which liquid nitrogen is applied to "freeze" off the growth (cryosurgery). ? They may also be burned off with electricity (electrocautery) or removed by scraping (curettage). Follow these instructions at home:  Watch your growth for any changes.  Keep all follow-up visits as told by your health care provider. This is important.  Do not scratch or pick at the growth or growths. This can cause them to become irritated or infected. Contact a health care provider if:  You suddenly have many new growths.  Your growth bleeds, itches, or hurts.  Your growth suddenly becomes larger or changes color. Summary  A seborrheic keratosis is a common, noncancerous (benign) skin growth.  Treatment is not usually needed for this condition, unless the growths are irritated or bleed often.  Watch your growth for any changes.  Contact a health care provider if you suddenly have many new growths or your growth suddenly becomes larger or changes color.  Keep all follow-up visits as told by your health care provider. This is important. This information is not intended to replace advice given to you by your health care provider. Make sure you discuss any questions you have with your health care provider. Document Revised: 03/19/2018 Document Reviewed: 03/19/2018 Elsevier Patient Education  2021 [employer].

## 2021-01-09 NOTE — Progress Notes (Addendum)
   SUBJECTIVE:   CHIEF COMPLAINT / HPI:   Chief Complaint  Patient presents with  . skin tags     Martha Mora is a 75 y.o. female here for skin treatment.   Patient with multiple anterior chest wall lesions that she would like to be treated.    PERTINENT  PMH / PSH: reviewed and updated as appropriate   OBJECTIVE:   BP (!) 142/80   Pulse 84   Ht 5\' 1"  (1.549 m)   Wt 234 lb (106.1 kg)   SpO2 97%   BMI 44.21 kg/m    GEN: pleasant elderly female, in no acute distress  CV: regular rate and rhythm, well perfused  RESP: no increased work of breathing, faint wheezes posteriorly, no rhonchi, no rales, using 2L oxygen, MSK: no LE edema, or extremity cyanosis SKIN: warm, dry, multiple irregular shaped lesions on anterior chest wall and upper and lower back some are hyperpigmented and others are flesh colored c/w seborrheic keratosis      PROCEDURE: Cryotherapy         The area surrounding the skin lesion was prepared in the usual sterile manner. A test freeze was performed ensuring coverage of entire area as above.  The cryotherapy gun was then applied for 3 seconds until an ice ball formed with a 5-7 mm border.  This was allowed to thaw and then the cryotherapy was again applied for 3 seconds to an ice ball of 5-7 mm.     The patient tolerated the procedure well.  Return precautions provided.  Return to the office if area on anterior chest wall or back do not heal completely as she may require repeat treatment or biopsy.  ASSESSMENT/PLAN:   Chronic respiratory failure (HCC) Patient with hx of COPD, chronic hypoxic respiratory failure, sleep apnea and vocal cord paralysis. Follows with pulmonology. Patient presented with O2 saturation of 84%. She was promptly placed on O2 and remained 95- 97%. Advised patient to use oxygen with acitivity. For mortality benefit should use at least 16 hour/day.    Handicap placard form completed.  Atrial fibrillation ([place]) Pt has atrial  fibrillation and states she can not afford her Eliquis. Denies recent falls, blood in stool and urine. Says she pays $115 for 3 month supply. Follows with Dr. [name], cardiologist for A FIB. Will reach out to pharmacy team for resources to help patient afford Eliquis. Patient requested refill for Amiodarone as she is out. Given a one time prescription of amiodarone. Future prescriptions to go to Dr. [name].   Vitamin D deficiency Dec 2021 Vit D level low. Patient taking weekly supplementation. Repeat Vit D in 3 months.   Seborrheic keratoses 13 seborrheic keratosis treated with cryotherapy. Patient tolerated procedure well. See procedure note above. Follow up in 4-6 weeks for additional treatment, if needed.      [name], DO PGY-2, [facility] 01/09/2021

## 2021-01-09 NOTE — Assessment & Plan Note (Signed)
13 seborrheic keratosis treated with cryotherapy. Patient tolerated procedure well. See procedure note above. Follow up in 4-6 weeks for additional treatment, if needed.

## 2021-01-09 NOTE — Assessment & Plan Note (Addendum)
Pt has atrial fibrillation and states she can not afford her Eliquis. Denies recent falls, blood in stool and urine. Says she pays $115 for 3 month supply. Follows with Dr. [name], cardiologist for A FIB. Will reach out to pharmacy team for resources to help patient afford Eliquis. Patient requested refill for Amiodarone as she is out. Given a one time prescription of amiodarone. Future prescriptions to go to Dr. [name].

## 2021-01-15 ENCOUNTER — Other Ambulatory Visit: Payer: Self-pay | Admitting: Genetic Counselor

## 2021-01-15 ENCOUNTER — Inpatient Hospital Stay: Payer: Medicare Other

## 2021-01-15 ENCOUNTER — Inpatient Hospital Stay: Payer: Medicare Other | Attending: Hematology | Admitting: Genetic Counselor

## 2021-01-15 ENCOUNTER — Encounter: Payer: Self-pay | Admitting: Genetic Counselor

## 2021-01-15 ENCOUNTER — Other Ambulatory Visit: Payer: Self-pay

## 2021-01-15 DIAGNOSIS — D72829 Elevated white blood cell count, unspecified: Secondary | ICD-10-CM | POA: Diagnosis not present

## 2021-01-15 DIAGNOSIS — Z853 Personal history of malignant neoplasm of breast: Secondary | ICD-10-CM

## 2021-01-15 DIAGNOSIS — Z803 Family history of malignant neoplasm of breast: Secondary | ICD-10-CM

## 2021-01-15 DIAGNOSIS — C50511 Malignant neoplasm of lower-outer quadrant of right female breast: Secondary | ICD-10-CM

## 2021-01-15 DIAGNOSIS — Z8481 Family history of carrier of genetic disease: Secondary | ICD-10-CM

## 2021-01-15 DIAGNOSIS — Z800 Family history of malignant neoplasm of digestive organs: Secondary | ICD-10-CM

## 2021-01-15 DIAGNOSIS — Z170 Estrogen receptor positive status [ER+]: Secondary | ICD-10-CM

## 2021-01-15 LAB — COMPREHENSIVE METABOLIC PANEL
ALT: 13 U/L (ref 0–44)
AST: 25 U/L (ref 15–41)
Albumin: 3.2 g/dL — ABNORMAL LOW (ref 3.5–5.0)
Alkaline Phosphatase: 82 U/L (ref 38–126)
Anion gap: 10 (ref 5–15)
BUN: 17 mg/dL (ref 8–23)
CO2: 27 mmol/L (ref 22–32)
Calcium: 9.5 mg/dL (ref 8.9–10.3)
Chloride: 106 mmol/L (ref 98–111)
Creatinine, Ser: 0.89 mg/dL (ref 0.44–1.00)
GFR, Estimated: >60 mL/min (ref >60)
Glucose, Bld: 92 mg/dL (ref 70–99)
Potassium: 4.3 mmol/L (ref 3.5–5.1)
Sodium: 143 mmol/L (ref 135–145)
Total Bilirubin: 0.3 mg/dL (ref 0.3–1.2)
Total Protein: 6.1 g/dL — ABNORMAL LOW (ref 6.5–8.1)

## 2021-01-15 LAB — CBC WITH DIFFERENTIAL/PLATELET
Abs Immature Granulocytes: 0.04 10*3/uL (ref 0.00–0.07)
Basophils Absolute: 0.1 10*3/uL (ref 0.0–0.1)
Basophils Relative: 1 %
Eosinophils Absolute: 0.5 10*3/uL (ref 0.0–0.5)
Eosinophils Relative: 4 %
HCT: 42 % (ref 36.0–46.0)
Hemoglobin: 12.8 g/dL (ref 12.0–15.0)
Immature Granulocytes: 0 %
Lymphocytes Relative: 18 %
Lymphs Abs: 1.9 10*3/uL (ref 0.7–4.0)
MCH: 29.2 pg (ref 26.0–34.0)
MCHC: 30.5 g/dL (ref 30.0–36.0)
MCV: 95.9 fL (ref 80.0–100.0)
Monocytes Absolute: 0.9 10*3/uL (ref 0.1–1.0)
Monocytes Relative: 9 %
Neutro Abs: 7 10*3/uL (ref 1.7–7.7)
Neutrophils Relative %: 68 %
Platelets: 259 10*3/uL (ref 150–400)
RBC: 4.38 MIL/uL (ref 3.87–5.11)
RDW: 14.6 % (ref 11.5–15.5)
WBC: 10.4 10*3/uL (ref 4.0–10.5)
nRBC: 0 % (ref 0.0–0.2)

## 2021-01-15 LAB — GENETIC SCREENING ORDER

## 2021-01-16 ENCOUNTER — Encounter: Payer: Self-pay | Admitting: Genetic Counselor

## 2021-01-16 ENCOUNTER — Other Ambulatory Visit: Payer: Self-pay | Admitting: Family Medicine
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
## 2021-01-17 ENCOUNTER — Encounter: Payer: Self-pay | Admitting: Family Medicine

## 2021-02-01 ENCOUNTER — Other Ambulatory Visit: Payer: Self-pay | Admitting: Family Medicine

## 2021-02-01 ENCOUNTER — Other Ambulatory Visit: Payer: Self-pay | Admitting: Nurse Practitioner

## 2021-02-01 DIAGNOSIS — R072 Precordial pain: Secondary | ICD-10-CM | POA: Diagnosis not present

## 2021-02-01 DIAGNOSIS — R0602 Shortness of breath: Secondary | ICD-10-CM | POA: Diagnosis not present

## 2021-02-01 DIAGNOSIS — I2510 Atherosclerotic heart disease of native coronary artery without angina pectoris: Secondary | ICD-10-CM | POA: Diagnosis not present

## 2021-02-01 DIAGNOSIS — I5022 Chronic systolic (congestive) heart failure: Secondary | ICD-10-CM | POA: Diagnosis not present

## 2021-02-01 DIAGNOSIS — G2581 Restless legs syndrome: Secondary | ICD-10-CM

## 2021-02-02 NOTE — Telephone Encounter (Signed)
Taking Lyrica instead of Duloxetine and Trazodone instead of Seroquel.

## 2021-02-05 ENCOUNTER — Other Ambulatory Visit: Payer: Self-pay

## 2021-02-05 ENCOUNTER — Telehealth: Payer: Self-pay | Admitting: Genetic Counselor

## 2021-02-05 ENCOUNTER — Telehealth: Payer: Self-pay | Admitting: *Deleted

## 2021-02-05 ENCOUNTER — Ambulatory Visit (INDEPENDENT_AMBULATORY_CARE_PROVIDER_SITE_OTHER): Payer: Medicare Other | Admitting: Family Medicine

## 2021-02-05 ENCOUNTER — Encounter: Payer: Self-pay | Admitting: Family Medicine

## 2021-02-05 ENCOUNTER — Encounter (INDEPENDENT_AMBULATORY_CARE_PROVIDER_SITE_OTHER): Payer: Medicare Other | Admitting: Ophthalmology

## 2021-02-05 VITALS — BP 140/68 | HR 82 | Ht 61.0 in | Wt 226.1 lb

## 2021-02-05 DIAGNOSIS — M5416 Radiculopathy, lumbar region: Secondary | ICD-10-CM | POA: Insufficient documentation

## 2021-02-05 DIAGNOSIS — J449 Chronic obstructive pulmonary disease, unspecified: Secondary | ICD-10-CM | POA: Diagnosis not present

## 2021-02-05 DIAGNOSIS — F419 Anxiety disorder, unspecified: Secondary | ICD-10-CM

## 2021-02-05 DIAGNOSIS — I4891 Unspecified atrial fibrillation: Secondary | ICD-10-CM

## 2021-02-05 DIAGNOSIS — M5431 Sciatica, right side: Secondary | ICD-10-CM

## 2021-02-05 DIAGNOSIS — J432 Centrilobular emphysema: Secondary | ICD-10-CM | POA: Insufficient documentation

## 2021-02-05 MED ORDER — DULOXETINE HCL 60 MG PO CPEP: 60.0000 mg | ORAL_CAPSULE | Freq: Every day | ORAL | 3 refills | Status: DC

## 2021-02-05 MED ORDER — PREGABALIN 75 MG PO CAPS: 75.0000 mg | ORAL_CAPSULE | Freq: Two times a day (BID) | ORAL | 0 refills | Status: DC

## 2021-02-05 NOTE — Telephone Encounter (Signed)
Contacted patient in attempt to disclose results of genetic testing.  LVM with contact information requesting a call back.  

## 2021-02-05 NOTE — Assessment & Plan Note (Addendum)
Uncontrolled. Restart Cymbalta as pt reported improvement in her anxiety when she was on this medication. GAD7: Score 7.

## 2021-02-05 NOTE — Chronic Care Management (AMB) (Signed)
   Care Management   Outreach Note  02/05/2021 Name: Martha Mora MRN: [id number] DOB: 04-21-1946  Martha Mora is a 75 y.o. year old female who is a primary care patient of [name], DO. I reached out to [name] by phone today in response to a referral sent by [name]'s PCP, [name], DO     An unsuccessful telephone outreach was attempted today. The patient was referred to the case management team for assistance with care management and care coordination.   Follow Up Plan: A HIPAA compliant phone message was left for the patient providing contact information and requesting a return call.  The care management team will reach out to the patient again over the next 7 days.  If patient returns call to provider office, please advise to call [place] [facility] Morales at [facility]

## 2021-02-05 NOTE — Progress Notes (Signed)
   SUBJECTIVE:   CHIEF COMPLAINT / HPI:    Martha Mora is a 75 y.o. female here for anxiety and back pain.  Pt reports since discontinuing Duloxetine her anxiety has worsened. She would like to go back on this medication.   Chronic Back Pain She uses a cane but has been having to use a walker for the last week. She reports bilateral lower back pain but now she has shooting pain radiating to her right leg. Has right sided buttock pain as well. Taking Tylenol 4-500 mg tablets twice a day. No fevers, new bladder or bowel incontinence, recent surgeries or chronic steroid use.    PERTINENT  PMH / PSH: reviewed and updated as appropriate   OBJECTIVE:   BP 140/68   Pulse 82   Ht 5\' 1"  (1.549 m)   Wt 226 lb 2 oz (102.6 kg)   SpO2 93%   BMI 42.73 kg/m    GEN: pleasant elderly female, in no acute distress  CV: regular rate and rhythm, well perfused  RESP: no increased work of breathing, clear to ascultation bilaterally  MSK:  Lumbar spine: - Inspection: no gross deformity or asymmetry, swelling or ecchymosis. No skin changes, prior cervical surgical incision - Palpation: No TTP over the spinous thoracic processes, TTP L4/5 midline, bilateral lumbar paraspinal tenderness, no TTP SI joints b/l, right mid gluteal tenderness - ROM: active ROM of the lumbar spine in flexion and extension without pain - Strength: 5/5 strength of lower extremity in L4-S1 nerve root distributions b/l - Neuro: sensation intact in the L4-S1 nerve root distribution b/l, 2+ L4 and S1 reflexes - Special testing: straight leg raise not assessed  SKIN: warm, dry NEURO: antalgic gait, moves all extremities appropriately PSYCH: Normal affect, appropriate speech and behavior    GAD 7 : Generalized Anxiety Score 02/05/2021 12/02/2019 08/31/2019 04/28/2019  Nervous, Anxious, on Edge 3 3 3 3   Control/stop worrying 1 2 2  0  Worry too much - different things 1 2 2  0  Trouble relaxing 1 3 3 3   Restless 1 3 2 3    Easily annoyed or irritable 0 2 1 3   Afraid - awful might happen 0 2 1 0  Total GAD 7 Score 7 17 14 12   Anxiety Difficulty - Very difficult Very difficult Not difficult at all      ASSESSMENT/PLAN:   Anxiety Uncontrolled. Restart Cymbalta as pt reported improvement in her anxiety when she was on this medication. GAD7: Score 7.  Chronic obstructive pulmonary disease ([place]) Follows with pulmonology. Ox sat today 93% at rest. She does not use oxygen throughout the day as she does not have a portable tank. DME order for portable oxygen placed. Advised pt to wear oxygen at least 16 hours a day.  Lumbar radiculopathy No red flag signs identified. MR Lumbar spine from 2016 reviewed. Pt with w/o significant lumbar stenosis however had lateral recess stenosis and moderate to severe L4 foraminal stenosis. Treat with Tylenol 1000 mg four times a day. Advised pt not to take 4-500mg  Tylenol. Increase Lyrica to 75 mg BID. Consider repeating MR Lumbar spine.     [name], DO PGY-2, [facility] 02/05/2021

## 2021-02-05 NOTE — Assessment & Plan Note (Signed)
No red flag signs identified. MR Lumbar spine from 2016 reviewed. Pt with w/o significant lumbar stenosis however had lateral recess stenosis and moderate to severe L4 foraminal stenosis. Treat with Tylenol 1000 mg four times a day. Advised pt not to take 4-500mg  Tylenol. Increase Lyrica to 75 mg BID. Consider repeating MR Lumbar spine.

## 2021-02-05 NOTE — Assessment & Plan Note (Addendum)
Follows with pulmonology. Ox sat today 93% at rest. She does not use oxygen throughout the day as she does not have a portable tank. DME order for portable oxygen placed. Advised pt to wear oxygen at least 16 hours a day.

## 2021-02-05 NOTE — Patient Instructions (Addendum)
It was great seeing you today!  For your back and leg pain: Take Pregablin (Lyrica) twice a day. I sent a new prescription to your pharmacy. Continue taking Tylenol but change to (2- 500 mg tablets) four times a day. The amount you were taking previously was too much for one dose.    For your anxiety: Stop by the pharmacy to pick up Duloxetine. Take 1 tablet daily.   Schedule an appointment for your skin treatment.     If you have questions or concerns please do not hesitate to call at [phone].  Dr. Rushie [facility]

## 2021-02-06 NOTE — Chronic Care Management (AMB) (Signed)
  Care Management   Note  02/06/2021 Name: [name] MRN: [id number] DOB: July 15, 1946  Martha Mora is a 75 y.o. year old female who is a primary care patient of [name], DO. I reached out to [name] by phone today in response to a referral sent by Ms. Brett Canales [employer]'s health plan.    Ms. Laser was given information about care management services today including:  1. Care management services include personalized support from designated clinical staff supervised by her physician, including individualized plan of care and coordination with other care providers 2. 24/7 contact phone numbers for assistance for urgent and routine care needs. 3. The patient may stop care management services at any time by phone call to the office staff.  Patient agreed to services and verbal consent obtained.   Follow up plan: Telephone appointment with care management team member scheduled for:02/13/2021  [facility]

## 2021-02-12 ENCOUNTER — Encounter: Payer: Self-pay | Admitting: Genetic Counselor

## 2021-02-12 ENCOUNTER — Ambulatory Visit: Payer: Self-pay | Admitting: Genetic Counselor

## 2021-02-12 DIAGNOSIS — Z853 Personal history of malignant neoplasm of breast: Secondary | ICD-10-CM

## 2021-02-12 DIAGNOSIS — Z1509 Genetic susceptibility to other malignant neoplasm: Secondary | ICD-10-CM

## 2021-02-12 DIAGNOSIS — Z1501 Genetic susceptibility to malignant neoplasm of breast: Secondary | ICD-10-CM

## 2021-02-12 DIAGNOSIS — Z803 Family history of malignant neoplasm of breast: Secondary | ICD-10-CM

## 2021-02-12 DIAGNOSIS — Z1589 Genetic susceptibility to other disease: Secondary | ICD-10-CM

## 2021-02-12 DIAGNOSIS — Z1379 Encounter for other screening for genetic and chromosomal anomalies: Secondary | ICD-10-CM

## 2021-02-12 DIAGNOSIS — C50511 Malignant neoplasm of lower-outer quadrant of right female breast: Secondary | ICD-10-CM

## 2021-02-12 DIAGNOSIS — Z800 Family history of malignant neoplasm of digestive organs: Secondary | ICD-10-CM

## 2021-02-12 HISTORY — DX: Genetic susceptibility to malignant neoplasm of breast: Z15.01

## 2021-02-12 HISTORY — DX: Genetic susceptibility to other malignant neoplasm: Z15.09

## 2021-02-12 HISTORY — DX: Genetic susceptibility to other disease: Z15.89

## 2021-02-12 NOTE — Progress Notes (Signed)
GENETIC TEST RESULTS   Patient Name: Martha Mora Patient Age: 75 y.o. Encounter Date: 02/12/2021  Martha Mora was seen in the [place] clinic on January 15, 2021 due to a personal and family history of cancer and a known hereditary predisposition to cancer in her daughter. Please refer to the prior Genetics clinic note for more information regarding Martha Mora's medical and family histories and our assessment at the time.   CANCER HISTORY: In 2008, at the age of 60, Martha Mora was diagnosed with invasive ductal carcinoma of the right breast (ER+/PR+/HER2-). The treatment plan included lumpectomy, adjuvant radiation, and tamoxifen for 10 years.  She is followed by Dr. Burr Medico for her breast cancer history.   FAMILY HISTORY:  We obtained a detailed, 4-generation family history.  Significant diagnoses are listed below: Family History  Problem Relation Age of Onset  . Colon cancer Paternal Grandmother 66  . Breast cancer Daughter 44       mutation in ATM gene    Martha Mora has one daughter, age 30, and one son, age 47.  Her daughter was recently diagnosed with breast cancer and was positive for a pathogenic variant in ATM at c.170G>A (p.Trp57*).  In her daughter's testing, No pathogenic variants were detected in AIP, ALK, APC, AXIN2, BAP1,  BARD1, BLM, BMPR1A, BRCA1, BRCA2, BRIP1, CASR, CDC73, CDH1, CDK4, CDKN1B, CDKN1C, CDKN2A (p14ARF), CDKN2A (p16INK4a), CEBPA, CHEK2, CTNNA1, DICER1, DIS3L2, EGFR (c.2369C>T, p.Thr790Met variant only), EPCAM (Deletion/duplication testing only), FH, FLCN, GATA2, GPC3, GREM1 (Promoter region deletion/duplication testing only), HOXB13 (c.251G>A, p.Gly84Glu), HRAS, KIT, MAX, MEN1, MET, MITF (c.952G>A, p.Glu318Lys variant only), MLH1, MSH2, MSH3, MSH6, MUTYH, NBN, NF1, NF2, NTHL1, PALB2, PDGFRA, PHOX2B, PMS2, POLD1, POLE, POT1, PRKAR1A, PTCH1, PTEN, RAD50, RAD51C, RAD51D, RB1, RECQL4, RET, RNF43, RUNX1, SDHAF2, SDHA (sequence changes only), SDHB, SDHC, SDHD,  SMAD4, SMARCA4, SMARCB1, SMARCE1, STK11, SUFU, TERC, TERT, TMEM127, TP53, TSC1, TSC2, VHL, WRN and WT1.  Martha Mora mother passed away at age 81 and did not have cancer.  Martha Mora father passed away at age 74 and did not have cancer.  Martha Mora paternal grandmother was diagnosed with colon cancer and passed away at age 63.  No other family history of cancer was reported.   Martha Mora is unaware of previous family history of genetic testing for hereditary cancer risks besides those mentioned above. Patient's maternal ancestors are of [place] descent, and paternal ancestors are of [place], [place], and [place] descent. There is no reported Ashkenazi Jewish ancestry. There is no known consanguinity.   GENETIC TESTING:  At the time of Martha Mora's visit, we recommended she pursue genetic testing for the specific ATM mutation that was identified in the family. The genetic testing reported out on February 01, 2021.  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     DISCUSSION: The ATM gene is involved in the detection and surveillance of DNA damage.  ATM phosphorylation of BRCA1 is critical for proper response to DNA double-strand breaks.  This is believed to be the reason for the role ATM has in breast cancer risk.  Women who are heterozygous ATM carriers have an increase breast cancer risk.  They have 5-fold increased risk of breast cancer <50 years, and 2-3 fold increased risk for breast cancer overall.  In families with familial breast cancer that were negative for BRCA1 or BRCA2 genes, approximately 2.7% of women were found to have one ATM mutation.  Management for individuals with ATM mutations can be found in the NCCN guidelines (v.1.2022).  These guidelines recommend the following:  Breast Cancer  Absolute risk: 15%-40%, compared to general population risk of approximately  12.8%  Screening: Annual mammogram with consideration of tomosynthesis and consider breast MRI with contrast starting at age 39 years.  Risk Reducing Mastectomy: Evidence insufficient, consider based on family history  Ovarian Cancer  Absolute risk: <3%, compared to general population risk of approximately 1.3%  Risk Reducing Salpingo-oophorectomy: Evidence insufficient, manage based on family history   Pancreatic Cancer  Absolute risk: ~5-10%, compared to general population risk of approximately 1.6%  Pancreatic screening for mutation carriers with a family history of pancreatic cancer   Other Cancer Risks  Unknown or insufficient evidence for prostate cancer in males  FAMILY MEMBERS: It is important that all of Martha Mora's relatives (both men and women) know of the presence of this gene mutation. Site-specific genetic testing can sort out who in the family is at risk and who is not.    Martha Mora son has 50% chance to have inherited this mutation. We recommended he have genetic testing for this same mutation, as identifying the presence of this mutation would allow them to also take advantage of risk-reducing measures.  Martha Mora is aware that distant relatives also have an increased chance of having this mutation.  Individuals with a ATM mutation are at a greater risk for having children with Ataxia-telangiectasia (A-T).  AT is characterized by progressive cerebellar degeneration (ataxia), dilated blood vessels in the eyes and skin (telangiectasia), immunodeficiency, chromosomal instability, increased sensitivity to ionizing radiation and a predisposition to lymphoma and leukemia.  Therefore, individuals of childbearing age who have a known ATM mutation may want to consider having their reproductive partner tested to determine their risk for having a child with A-T.  SUPPORT AND RESOURCES: If Martha Mora is interested in ATM-specific information and support, there are two groups, Facing  Our Risk (www.facingourrisk.com) and Bright Pink (www.brightpink.org) which some people have found useful. They provide opportunities to speak with other individuals from high-risk families. To locate genetic counselors in other cities, visit the website of the [employer] (ArtistMovie.se) and Secretary/administrator for a Social worker by zip code.  We encouraged Ms. Kraker to remain in contact with [place] on an annual basis so we can update her personal and family histories, and let her know of advances in cancer genetics that may benefit the family. Our contact number was provided. Ms. Lacorte questions were answered to her satisfaction today, and she knows she is welcome to call anytime with additional questions.   Teona Vargus M. Joette Catching, [place], [facility] Genetic Counselor [email or web address] (P) [phone]

## 2021-02-13 ENCOUNTER — Ambulatory Visit: Payer: Medicare Other

## 2021-02-13 ENCOUNTER — Other Ambulatory Visit: Payer: Self-pay | Admitting: Family Medicine

## 2021-02-13 MED ORDER — QUETIAPINE FUMARATE 25 MG PO TABS: ORAL_TABLET | ORAL | 0 refills | Status: DC

## 2021-02-13 NOTE — Chronic Care Management (AMB) (Signed)
Care Management    RN Visit Note  02/13/2021 Name: Martha Mora MRN: [id number] DOB: 08/27/1946  Subjective: Martha Mora is a 75 y.o. year old female who is a primary care patient of Martha Hensen, DO. The care management team was consulted for assistance with disease management and care coordination needs.    Engaged with patient by telephone for initial visit in response to provider referral for case management and/or care coordination services.   Consent to Services:   Martha Mora was given information about Care Management services today including:  1. Care Management services includes personalized support from designated clinical staff supervised by her physician, including individualized plan of care and coordination with other care providers 2. 24/7 contact phone numbers for assistance for urgent and routine care needs. 3. The patient may stop case management services at any time by phone call to the office staff.  Patient agreed to services and consent obtained.   Assessment: Patient is currently experiencing difficulty with low oxygen level and needs to wear her oxygen during the day... See Care Plan below for interventions and patient self-care actives. Follow up Plan: Patient would like continued follow-up.  CCM RNCM will outreach the patient within the next 14 days.. Patient will call office if needed prior to next encounter  Review of patient past medical history, allergies, medications, health status, including review of consultants reports, laboratory and other test data, was performed as part of comprehensive evaluation and provision of chronic care management services.   SDOH (Social Determinants of Health) assessments and interventions performed:    Care Plan  Allergies  Allergen Reactions  . Keflex [Cephalexin] Other (See Comments)    "Made me feel funny"  Tolerated Rocephin in April 2020 over several days and 07/01/19 in ED.  [name] Penicillins Rash and  Other (See Comments)    03/01/19 tolerated Zosyn Red bumps all over stomach Did it involve swelling of the face/tongue/throat, SOB, or low BP? No Did it involve sudden or severe rash/hives, skin peeling, or any reaction on the inside of your mouth or nose? Yes Did you need to seek medical attention at a hospital or doctor's office? Yes When did it last happen?30+ years  If all above answers are "NO", may proceed with cephalosporin use.  03/01/19 tolerated Zosyn Red bumps all over stomach Did it involve swelling of the face/tongue/throat, SOB, or low BP? No Did it involve sudden or severe rash/hives, skin peeling, or any reaction on the inside of your mouth or nose? Yes Did you need to seek medical attention at a hospital or doctor's office? Yes When did it last happen?30+ years  If all above answers are "NO", may proceed with cephalosporin use.  . Ropinirole Hcl Other (See Comments)    "Could not stop moving" likely akathisia occurred in 2019  . Shellfish Allergy Nausea And Vomiting    Outpatient Encounter Medications as of 02/13/2021  Medication Sig Note  . acetaminophen (TYLENOL) 500 MG tablet Take 1,500 mg by mouth 2 (two) times daily. 11/01/2019: Take 3 in the morning and 4 at night.  [name] albuterol (VENTOLIN HFA) 108 (90 Base) MCG/ACT inhaler 1-2 inhalations every 4-6 hours as needed for wheezing. Dispense spacer as needed.   [name] amLODipine (NORVASC) 5 MG tablet Take 2 tablets (10 mg total) by mouth daily.   . busPIRone (BUSPAR) 10 MG tablet TAKE 1 TABLET BY MOUTH THREE TIMES A DAY   . Calcium Carb-Cholecalciferol 600-800 MG-UNIT TABS Take 1 tablet by  mouth 2 (two) times a day.   . DULoxetine (CYMBALTA) 60 MG capsule Take 1 capsule (60 mg total) by mouth daily.   [name] ELIQUIS 5 MG TABS tablet TAKE 1 TABLET BY MOUTH TWICE A DAY   . losartan (COZAAR) 100 MG tablet Take 100 mg by mouth daily.    . metoprolol tartrate (LOPRESSOR) 25 MG tablet Take 0.5 tablets (12.5 mg total) by mouth  2 (two) times daily. 02/13/2021: Patient takes whole tablet in the morning  . Multiple Vitamin (MULTIVITAMIN) tablet Take 1 tablet by mouth daily. Herbal Life multivitamin   . Multiple Vitamins-Minerals (PRESERVISION AREDS 2 PO) Take 1 tablet by mouth daily.   [name] omeprazole (PRILOSEC) 40 MG capsule Take 1 capsule (40 mg total) by mouth daily.   . pregabalin (LYRICA) 75 MG capsule Take 1 capsule (75 mg total) by mouth 2 (two) times daily.   [name] PROLIA 60 MG/ML SOSY injection Inject 60 mg into the skin every 6 (six) months.  11/23/2019: Daughter recalls Dec/2020  . rosuvastatin (CRESTOR) 20 MG tablet Take 20 mg by mouth daily.    . traZODone (DESYREL) 100 MG tablet TAKE 0.5 TABLETS (50 MG TOTAL) BY MOUTH AT BEDTIME AS NEEDED FOR SLEEP. 02/13/2021: Patient takes 11/2 a day  . Vitamin D, Ergocalciferol, (DRISDOL) 1.25 MG (50000 UNIT) CAPS capsule TAKE 1 CAPSULE BY MOUTH ONE TIME PER WEEK   . amiodarone (PACERONE) 200 MG tablet Take 1 tablet (200 mg total) by mouth daily.   . [employer] MELATONIN 3 MG TABS TAKE 1 TABLET (3 MG TOTAL) BY MOUTH AT BEDTIME AS NEEDED (FOR SLEEP). (Patient not taking: No sig reported)   . QUEtiapine (SEROQUEL) 25 MG tablet TAKE 1 TABLET BY MOUTH EVERYDAY AT BEDTIME (Patient not taking: No sig reported) 02/13/2021: Patient has the  medication but has not started taking it.  . zinc gluconate 50 MG tablet Take 50 mg by mouth daily. (Patient not taking: No sig reported)    No facility-administered encounter medications on file as of 02/13/2021.    Patient Active Problem List   Diagnosis Date Noted  . 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 Genetic testing 02/12/2021  . Chronic obstructive pulmonary disease ([place]) 02/05/2021  . Lumbar radiculopathy 02/05/2021  . Atrial fibrillation ([place]) 01/09/2021  . Vitamin D deficiency 01/09/2021  . Seborrheic keratoses 01/09/2021  . Urinary urgency 11/21/2020  . Cough with sputum 07/06/2020  . Vertigo 06/12/2020  . Vocal cord dysfunction  06/12/2020  . Chronic respiratory failure ([place]) 06/12/2020  . History of atrial fibrillation 02/15/2020  . CAP (community acquired pneumonia) 11/23/2019  . Delirium   . 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 07/16/2019  . Renal stones 03/15/2019  . Acute respiratory failure with hypoxia ([place]) 02/22/2019  . Malodorous urine 10/06/2018  . Insomnia 09/08/2016  . Urinary incontinence, mixed 10/25/2015  . Restless leg syndrome 05/24/2015  . History of tobacco use 05/23/2014  . Mitral valve regurgitation 05/23/2014  . Dysfunctional uterine bleeding 09/02/2013  . Breast cancer of lower-outer quadrant of right female breast ([place]) 07/24/2013  . Postmenopausal bleeding 07/14/2013  . Osteopenia 12/27/2011  . Anxiety 07/19/2009  . Osteoarthritis 07/19/2009  . NAUSEA 07/19/2009  . IRRITABLE BOWEL SYNDROME 11/17/2008  . PERSONAL HX COLONIC POLYPS 07/19/2008  . COLONIC POLYPS, ADENOMATOUS 11/17/2007  . History of breast cancer 11/17/2007  . Hyperlipidemia 11/17/2007  . Obesity, unspecified 11/17/2007  . Depression 11/17/2007  . Essential hypertension 11/17/2007  . Coronary atherosclerosis 11/17/2007  . HEMORRHOIDS, INTERNAL 11/17/2007  . GERD  11/17/2007  . PEPTIC STRICTURE 11/17/2007  . OSA (obstructive sleep apnea) 11/17/2007  . Diverticulosis of colon (without mention of hemorrhage) 09/05/2005  . REFLUX ESOPHAGITIS 06/01/2002  . ESOPHAGEAL STRICTURE 06/01/2002    Conditions to be addressed/monitored: COPD  Care Plan : RN Case Manager  Updates made by Lazaro Arms, RN since 02/13/2021 12:00 AM    Problem: Quality of Life COPD     Goal: Quality of Life Maintained by using  portable Oxygen   Start Date: 02/13/2021  Expected End Date: 03/07/2021  Priority: High  Note:    . Care Coordination needs related to needed DME/Supplies in a patient with COPD  Nurse Case Manager Clinical Goal(s):  [name] Over the next 14 days, patient will verbalize understanding of plan to obtain needed DME.  . Over the  next 21 days, patient will work with care guide and/or [facility]  to address needs related to DME needs.  Interventions:  . Inter-disciplinary care team collaboration (see longitudinal plan of care) . Collaboration with/confirmation of receipt of DME orders at [facility]  . Evaluation of current treatment plan related to primary care provider's note on 01/09/21 and patient's adherence to plan as established by provider. . Reviewed medications with patient and discussed of taking medication- patient reports that she had thrown her Seroquel away.  She is going to call the pharmacy to see if she has a refill so that she can startt to take the medication. . Discussed plans with patient for ongoing care management follow up and provided patient with direct contact information for care management team . Will send patient COPD folder. . Explain to the patient that her information will be sent to [facility] for the portable oxygen and she will need to have a Fit test done for portable oxygen.   Patient Goals/Self-Care Activities: . Avoid smoke and air pollution . Exercise on a regular basis . Keep your airway clear from mucus build up  . Control your cough by drinking plenty of water . Use a humidifier, if needed . Visit your doctor on a regular basis . Receive your annual flu vaccine       Lazaro Arms RN, BSN, [facility] Care Management Coordinator [place] Phone: [phone] I Fax: [phone]

## 2021-02-13 NOTE — Patient Instructions (Addendum)
Ms. Wahlstrom  it was nice speaking with you. Please call me directly [phone] if you have questions about the goals we discussed.  Goals Addressed            This Visit's Progress   . COPD Management       Timeframe:  Short-Term Goal Priority:  High Start Date:      02/13/21                       Expected End Date:   03/17/21                     Follow Up Date 02/28/21  .  Avoid smoke and air pollution . Exercise on a regular basis . Keep your airway clear from mucus build up  . Control your cough by drinking plenty of water . Use a humidifier, if needed . Visit your doctor on a regular basis . Receive your annual flu vaccine   Why is this important?    Having a long-term illness can be scary.   It can also be stressful for you and your caregiver.   These steps may help.    Notes:        Patient Care Plan: RN Case Manager  Problem Identified: Quality of Life COPD   Goal: Quality of Life Maintained by using portable Oxygen   Start Date: 02/13/2021  Expected End Date: 03/07/2021  Priority: High   . Care Coordination needs related to needed DME/Supplies in a patient with COPD  Nurse Case Manager Clinical Goal(s):  [name] Over the next 14 days, patient will verbalize understanding of plan to obtain needed DME.  . Over the next 21 days, patient will work with care guide and/or [employer]  to address needs related to DME needs.  Interventions:  . Inter-disciplinary care team collaboration (see longitudinal plan of care) . Collaboration with/confirmation of receipt of DME orders at [place]  . Evaluation of current treatment plan related to primary care provider's note on 01/09/21 and patient's adherence to plan as established by provider. . Reviewed medications with patient and discussed of taking medication- patient reports that she had thrown her Seroquel away.  She is going to call the pharmacy to see if she has a refill so that she can startt to take the medication. . Discussed  plans with patient for ongoing care management follow up and provided patient with direct contact information for care management team . Will send COPD education Explain to the patient that her information will be sent to [employer] for the portable oxygen and she will need to have a Fit test done for portable oxygen.   Patient Goals/Self-Care Activities: . Avoid smoke and air pollution . Exercise on a regular basis . Keep your airway clear from mucus build up  . Control your cough by drinking plenty of water . Use a humidifier, if needed . Visit your doctor on a regular basis . Receive your annual flu vaccine        Ms. Leavens received Care Management services today:  1. Care Management services include personalized support from designated clinical staff supervised by her physician, including individualized plan of care and coordination with other care providers 2. 24/7 contact [phone] for assistance for urgent and routine care needs. 3. Care Management are voluntary services and be declined at any time by calling the office.  The patient verbalized understanding of instructions provided today and declined a print copy  of patient instruction materials.     Follow up Plan: Patient would like continued follow-up.  CCM RNCM will outreach the patient withinn the next 14 days.. Patient will call office if needed prior to next encounter  Lazaro Arms, RN   [phone]

## 2021-02-14 ENCOUNTER — Other Ambulatory Visit: Payer: Self-pay | Admitting: Family Medicine

## 2021-02-19 ENCOUNTER — Encounter (INDEPENDENT_AMBULATORY_CARE_PROVIDER_SITE_OTHER): Payer: Medicare Other | Admitting: Ophthalmology

## 2021-02-19 ENCOUNTER — Other Ambulatory Visit: Payer: Self-pay

## 2021-02-19 DIAGNOSIS — H35033 Hypertensive retinopathy, bilateral: Secondary | ICD-10-CM | POA: Diagnosis not present

## 2021-02-19 DIAGNOSIS — H43813 Vitreous degeneration, bilateral: Secondary | ICD-10-CM | POA: Diagnosis not present

## 2021-02-19 DIAGNOSIS — I10 Essential (primary) hypertension: Secondary | ICD-10-CM | POA: Diagnosis not present

## 2021-02-19 DIAGNOSIS — H33302 Unspecified retinal break, left eye: Secondary | ICD-10-CM | POA: Diagnosis not present

## 2021-02-19 DIAGNOSIS — H353231 Exudative age-related macular degeneration, bilateral, with active choroidal neovascularization: Secondary | ICD-10-CM

## 2021-02-28 ENCOUNTER — Telehealth: Payer: Self-pay

## 2021-02-28 ENCOUNTER — Telehealth: Payer: Medicare Other

## 2021-02-28 NOTE — Telephone Encounter (Signed)
  Care Management   Outreach Note  02/28/2021 Name: Martha Mora MRN: [id number] DOB: Apr 04, 1946  Referred by: [name], DO Reason for referral : Care Coordination (Oxygen/)   An unsuccessful telephone outreach was attempted today. The patient was referred to the case management team for assistance with care management and care coordination.  2 attempts made   Follow Up Plan: A HIPAA compliant phone message was left for the patient providing contact information and requesting a return call.  The care management team will reach out to the patient again over the next 7-14 days.   Lazaro Arms RN, BSN, [facility] Care Management Coordinator [place] Phone: [phone] I Fax: [phone]

## 2021-03-05 ENCOUNTER — Other Ambulatory Visit: Payer: Self-pay

## 2021-03-05 ENCOUNTER — Ambulatory Visit (INDEPENDENT_AMBULATORY_CARE_PROVIDER_SITE_OTHER): Payer: Medicare Other | Admitting: Family Medicine

## 2021-03-05 ENCOUNTER — Encounter: Payer: Self-pay | Admitting: Family Medicine

## 2021-03-05 VITALS — BP 128/62 | HR 60 | Ht 61.0 in | Wt 227.2 lb

## 2021-03-05 DIAGNOSIS — M5416 Radiculopathy, lumbar region: Secondary | ICD-10-CM

## 2021-03-05 DIAGNOSIS — F419 Anxiety disorder, unspecified: Secondary | ICD-10-CM

## 2021-03-05 NOTE — Progress Notes (Signed)
   SUBJECTIVE:   CHIEF COMPLAINT / HPI:   Chief Complaint  Patient presents with  . Follow-up  . Hand Pain     Martha Mora is a 75 y.o. female here for back pain and anxiety follow up.    Anxiety  Pt reports significant improvement since restarting Seroquel and Duloxetine.   Back pain  Reports worsening shooting right sided lower back pain.  Pain radiates to her right knee.  She uses her cane normally but has been having to use her walker at home.  She has been taking Tylenol and Lyrica for pain.  They are currently previously worked but however is not touching her pain now.  Denies unintended weight loss, perianal numbness loss of bowel or bladder control.  She has not been using steroids.  She is not immunosuppressed.      PERTINENT  PMH / PSH: reviewed and updated as appropriate   OBJECTIVE:   BP 128/62   Pulse 60   Ht 5\' 1"  (1.549 m)   Wt 227 lb 3.2 oz (103.1 kg)   SpO2 90%   BMI 42.93 kg/m    GEN:  well appearing elderly female, in no acute distress  CV: regular rate  RESP: no increased work of breathing  MSK:  Lumbar spine: - Inspection: no gross deformity or asymmetry, swelling or ecchymosis. No skin changes, prior cervical and lumbar surgical incisions - Palpation: No TTP over the spinous thoracic processes, TTP L3/4/5 midline, bilateral lumbar paraspinal tenderness, no TTP SI joints b/l, right mid gluteal tenderness - ROM: active ROM limited due to pain, sitting in wheelchair - Strength: 5/5 strength of lower extremity in L4-S1 nerve root distributions b/l - Neuro: sensation intact in the L4-S1 nerve root distribution bilaterally  - Special testing: straight leg raise not assessed  SKIN: warm, dry   ASSESSMENT/PLAN:   Anxiety Stable. Continue Cymbalta and Seroquel. GAD 7 9.  Lumbar radiculopathy Uncontrolled. Pt in wheelchair today whereas she is normally able to ambulate with and at times without her cane. Continue Tylenol 1000 mg 3 times daily  and Lyrica 75 mg twice daily. Considered increasing Lyrica given history of side effects will stay at current dose. On Eliquis, therefore holding oral NSAIDs. Encouraged topical diclofenac. No red flag symptoms identified.  MRI lumbar from 2016 reviewed.  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  Suspect progression of spinal disease. Will repeat MR lumbar spine.  Patient requires antianxiety medications and an open MRI.  MRI scheduled for Mar 26, 2021.  Patient to follow-up after MRI or before if pain acutely worsens.      [name], DO PGY-2, [facility] 03/06/2021

## 2021-03-06 NOTE — Assessment & Plan Note (Signed)
Stable. Continue Cymbalta and Seroquel. GAD 7 9.

## 2021-03-06 NOTE — Assessment & Plan Note (Addendum)
Uncontrolled. Pt in wheelchair today whereas she is normally able to ambulate with and at times without her cane. Continue Tylenol 1000 mg 3 times daily and Lyrica 75 mg twice daily. Considered increasing Lyrica given history of side effects will stay at current dose. On Eliquis, therefore holding oral NSAIDs. Encouraged topical diclofenac. No red flag symptoms identified.  MRI lumbar from 2016 reviewed.  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  Suspect progression of spinal disease. Will repeat MR lumbar spine.  Patient requires antianxiety medications and an open MRI.  MRI scheduled for Mar 26, 2021.  Patient to follow-up after MRI or before if pain acutely worsens.

## 2021-03-06 NOTE — Telephone Encounter (Signed)
Patient has been rescheduled for 03/14/2021 .

## 2021-03-09 ENCOUNTER — Encounter: Payer: Self-pay | Admitting: Family Medicine

## 2021-03-14 ENCOUNTER — Ambulatory Visit: Payer: Medicare Other

## 2021-03-15 NOTE — Patient Instructions (Signed)
Visit Information  Martha Mora  it was nice speaking with you. Please call me directly [phone] if you have questions about the goals we discussed.  Goals Addressed              This Visit's Progress   .  I would like portable oxygen (pt-stated)        Timeframe:  Short-Term Goal Priority:  High Start Date:      02/13/21                       Expected End Date:   03/17/21                     Follow Up Date 02/28/21  .  Avoid smoke and air pollution . Exercise on a regular basis . Keep your airway clear from mucus build up  . Control your cough by drinking plenty of water . Use a humidifier, if needed . Visit your doctor on a regular basis . Receive your annual flu vaccine   Why is this important?    Having a long-term illness can be scary.   It can also be stressful for you and your caregiver.   These steps may help.    Notes:        The patient verbalized understanding of instructions, educational materials, and care plan provided today and declined offer to receive copy of patient instructions, educational materials, and care plan.   Follow up Plan: Patient would like continued follow-up.  CCM RNCM will outreach the patient within the next 2 weeks.  Patient will call office if needed prior to next encounter  Lazaro Arms, RN  [phone]

## 2021-03-15 NOTE — Chronic Care Management (AMB) (Signed)
Care Management    RN Visit Note  03/15/2021 Name: Racquel Arkin MRN: [id number] DOB: 1946/02/09  Subjective: Martha Mora is a 75 y.o. year old female who is a primary care patient of [name], DO. The care management team was consulted for assistance with disease management and care coordination needs.    Engaged with patient by telephone for follow up visit in response to provider referral for case management and/or care coordination services.   The patient was given information about Chronic Care Management services, agreed to services, and gave verbal consent prior to initiation of services.  Please see initial visit note for detailed documentation.  Patient agreed to services and consent obtained.    Assessment: The patient was unable to remember who she had her account with for the  concentrator that she has but will talk with [employer] regarding a portable tank.. See Care Plan below for interventions and patient self-care actives. Follow up Plan: Patient would like continued follow-up.  CCM RNCM will outreach the patient within the next 2 weeks.  Patient will call office if needed prior to next encounter : Review of patient past medical history, allergies, medications, health status, including review of consultants reports, laboratory and other test data, was performed as part of comprehensive evaluation and provision of chronic care management services.   SDOH (Social Determinants of Health) assessments and interventions performed:    Care Plan  Allergies  Allergen Reactions  . Keflex [Cephalexin] Other (See Comments)    "Made me feel funny"  Tolerated Rocephin in April 2020 over several days and 07/01/19 in ED.  [name] Penicillins Rash and Other (See Comments)    03/01/19 tolerated Zosyn Red bumps all over stomach Did it involve swelling of the face/tongue/throat, SOB, or low BP? No Did it involve sudden or severe rash/hives, skin peeling, or any reaction on the  inside of your mouth or nose? Yes Did you need to seek medical attention at a hospital or doctor's office? Yes When did it last happen?30+ years  If all above answers are "NO", may proceed with cephalosporin use.  03/01/19 tolerated Zosyn Red bumps all over stomach Did it involve swelling of the face/tongue/throat, SOB, or low BP? No Did it involve sudden or severe rash/hives, skin peeling, or any reaction on the inside of your mouth or nose? Yes Did you need to seek medical attention at a hospital or doctor's office? Yes When did it last happen?30+ years  If all above answers are "NO", may proceed with cephalosporin use.  . Ropinirole Hcl Other (See Comments)    "Could not stop moving" likely akathisia occurred in 2019  . Shellfish Allergy Nausea And Vomiting    Outpatient Encounter Medications as of 03/14/2021  Medication Sig Note  . acetaminophen (TYLENOL) 500 MG tablet Take 1,500 mg by mouth 2 (two) times daily. 11/01/2019: Take 3 in the morning and 4 at night.  [name] albuterol (VENTOLIN HFA) 108 (90 Base) MCG/ACT inhaler 1-2 inhalations every 4-6 hours as needed for wheezing. Dispense spacer as needed.   [name] amLODipine (NORVASC) 5 MG tablet Take 2 tablets (10 mg total) by mouth daily.   . busPIRone (BUSPAR) 10 MG tablet TAKE 1 TABLET BY MOUTH THREE TIMES A DAY   . Calcium Carb-Cholecalciferol 600-800 MG-UNIT TABS Take 1 tablet by mouth 2 (two) times a day.   . CVS MELATONIN 3 MG TABS TAKE 1 TABLET (3 MG TOTAL) BY MOUTH AT BEDTIME AS NEEDED (FOR SLEEP). (Patient not  taking: No sig reported)   . DULoxetine (CYMBALTA) 60 MG capsule Take 1 capsule (60 mg total) by mouth daily.   [name] ELIQUIS 5 MG TABS tablet TAKE 1 TABLET BY MOUTH TWICE A DAY   . losartan (COZAAR) 100 MG tablet Take 100 mg by mouth daily.    . metoprolol tartrate (LOPRESSOR) 25 MG tablet Take 0.5 tablets (12.5 mg total) by mouth 2 (two) times daily. 02/13/2021: Patient takes whole tablet in the morning  . Multiple  Vitamin (MULTIVITAMIN) tablet Take 1 tablet by mouth daily. Herbal Life multivitamin   . Multiple Vitamins-Minerals (PRESERVISION AREDS 2 PO) Take 1 tablet by mouth daily.   [name] omeprazole (PRILOSEC) 40 MG capsule Take 1 capsule (40 mg total) by mouth daily.   . pregabalin (LYRICA) 75 MG capsule Take 1 capsule (75 mg total) by mouth 2 (two) times daily.   [name] PROLIA 60 MG/ML SOSY injection Inject 60 mg into the skin every 6 (six) months.  11/23/2019: Daughter recalls Dec/2020  . QUEtiapine (SEROQUEL) 25 MG tablet TAKE 1 TABLET BY MOUTH EVERYDAY AT BEDTIME   . rosuvastatin (CRESTOR) 20 MG tablet Take 20 mg by mouth daily.    . traZODone (DESYREL) 100 MG tablet TAKE 0.5 TABLETS (50 MG TOTAL) BY MOUTH AT BEDTIME AS NEEDED FOR SLEEP. 02/13/2021: Patient takes 11/2 a day  . Vitamin D, Ergocalciferol, (DRISDOL) 1.25 MG (50000 UNIT) CAPS capsule TAKE 1 CAPSULE BY MOUTH ONE TIME PER WEEK   . zinc gluconate 50 MG tablet Take 50 mg by mouth daily. (Patient not taking: No sig reported)    No facility-administered encounter medications on file as of 03/14/2021.    Patient Active Problem List   Diagnosis Date Noted  . 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 Genetic testing 02/12/2021  . Chronic obstructive pulmonary disease ([place]) 02/05/2021  . Lumbar radiculopathy 02/05/2021  . Atrial fibrillation ([place]) 01/09/2021  . Vitamin D deficiency 01/09/2021  . Seborrheic keratoses 01/09/2021  . Urinary urgency 11/21/2020  . Cough with sputum 07/06/2020  . Vertigo 06/12/2020  . Vocal cord dysfunction 06/12/2020  . Chronic respiratory failure ([place]) 06/12/2020  . History of atrial fibrillation 02/15/2020  . CAP (community acquired pneumonia) 11/23/2019  . Delirium   . 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 07/16/2019  . Renal stones 03/15/2019  . Acute respiratory failure with hypoxia ([place]) 02/22/2019  . Malodorous urine 10/06/2018  . Insomnia 09/08/2016  . Urinary incontinence, mixed 10/25/2015  . Restless leg  syndrome 05/24/2015  . History of tobacco use 05/23/2014  . Mitral valve regurgitation 05/23/2014  . Dysfunctional uterine bleeding 09/02/2013  . Breast cancer of lower-outer quadrant of right female breast ([place]) 07/24/2013  . Postmenopausal bleeding 07/14/2013  . Osteopenia 12/27/2011  . Anxiety 07/19/2009  . Osteoarthritis 07/19/2009  . NAUSEA 07/19/2009  . IRRITABLE BOWEL SYNDROME 11/17/2008  . PERSONAL HX COLONIC POLYPS 07/19/2008  . COLONIC POLYPS, ADENOMATOUS 11/17/2007  . History of breast cancer 11/17/2007  . Hyperlipidemia 11/17/2007  . Obesity, unspecified 11/17/2007  . Depression 11/17/2007  . Essential hypertension 11/17/2007  . Coronary atherosclerosis 11/17/2007  . HEMORRHOIDS, INTERNAL 11/17/2007  . GERD 11/17/2007  . PEPTIC STRICTURE 11/17/2007  . OSA (obstructive sleep apnea) 11/17/2007  . Diverticulosis of colon (without mention of hemorrhage) 09/05/2005  . REFLUX ESOPHAGITIS 06/01/2002  . ESOPHAGEAL STRICTURE 06/01/2002    Conditions to be addressed/monitored: Oxygen  Care Plan : RN Case Manager  Updates made by Lazaro Arms, RN since 03/15/2021 12:00 AM  Problem: Quality of Life  COPD   Goal: Quality of Life Maintained by using portable Oxygen   Start Date: 02/13/2021  Expected End Date: 04/17/2021  Priority: High   . Care Coordination needs related to needed DME/Supplies in a patient with COPD  Nurse Case Manager Clinical Goal(s):  [name] Over the next 14 days, patient will verbalize understanding of plan to obtain needed DME.  . Over the next 21 days, patient will work with care guide and/or [employer]  to address needs related to DME needs.  Interventions:  . Inter-disciplinary care team collaboration (see longitudinal plan of care) . Collaboration with/confirmation of receipt of DME orders at [place]  . Evaluation of current treatment plan related to primary care provider's note on 01/09/21 and patient's adherence to plan as established by provider. . Reviewed  medications with patient and discussed of taking medication- patient reports that she had thrown her Seroquel away.  She is going to call the pharmacy to see if she has a refill so that she can startt to take the medication. . Discussed plans with patient for ongoing care management follow up and provided patient with direct contact information for care management team . Will send the patient COPD folder . Explain to the patient that her information will be sent to [employer] for the portable oxygen and she will need to have a Fit test done for portable oxygen. [name] Spoke with the patient and she does not have any equipment with [employer] after many calls we were unable to find out who the patient has her concentrator with.  She wants to see about getting her portable oxygen with [employer] she may have to pay for the portable oxygen out of pocket .  RNCM sent a message to [employer] via epic for them to call and explain what options that she has.  The patient states that she is in agreement with this.   Patient Goals/Self-Care Activities: . Avoid smoke and air pollution . Exercise on a regular basis . Keep your airway clear from mucus build up  . Control your cough by drinking plenty of water . Use a humidifier, if needed . Visit your doctor on a regular basis . Receive your annual flu vaccine        Lazaro Arms RN, BSN, [facility] Care Management Coordinator [place] Phone: [phone] I Fax: [phone]

## 2021-03-15 NOTE — Progress Notes (Signed)
    SUBJECTIVE:   CHIEF COMPLAINT / HPI:   Urinary Tract Infection: Patient complains of burning with urination, hesitancy, incontinence and urgency She has had symptoms for 2 days.  Patient reports symptoms acutely worsened yesterday around 2 PM.  She took Azo at 3 AM this morning.  She does have a history of stress incontinence.  She has noted that she has been leaking urine more recently and has been wearing pads over the last week.  Prior to that, she reports using toilet tissue for the last 30 to 40 years for her stress incontinence.  Patient denies back pain, fever, stomach ache and vaginal discharge. Patient does not have a history of recurrent UTI.  Patient does have a history of e coli bacteremia in 02/2019 where she was admitted to the hospital.   PERTINENT  PMH / PSH: urinary incontinence, HTN, HLD, GERD, OA, OSA, lithotripsy   04/28/20 Proteus Res to ampicillin, cipro, macrobid and bactrim  02/21/19 Ecoli Res amp and amp/sulbacatm OBJECTIVE:   BP (!) 130/58   Pulse 66   Wt 223 lb 3.2 oz (101.2 kg)   SpO2 91%   BMI 42.17 kg/m   Well-appearing, no acute distress.  No CVA tenderness.  No tenderness to palpation of abdomen or pelvic area.  ASSESSMENT/PLAN:   Dysuria Patient reporting 2-day history of dysuria, urgency.  She took Azo this morning.  She was able to leave a sample with [place] today, unclear if UA will be able to be run.  We will also send for culture.  Given patient is having symptoms, will treat with Keflex 4 times daily x7 days.  (Patient had Keflex allergy in her chart, spoke with her about this and we discussed removing this from her chart today.)  It looks like she has been seen a few times in the last year for urinary symptoms, she reports symptoms at previous visits were urgency, but negative UA and cultures. Consider other forms of incontinence? Should she continue to have urinary symptoms without obvious infection, would recommend further work-up and evaluation for  other sources.    Wilber Oliphant, MD [place]

## 2021-03-16 ENCOUNTER — Ambulatory Visit (INDEPENDENT_AMBULATORY_CARE_PROVIDER_SITE_OTHER): Payer: Medicare Other | Admitting: Family Medicine

## 2021-03-16 ENCOUNTER — Encounter: Payer: Self-pay | Admitting: Family Medicine

## 2021-03-16 ENCOUNTER — Other Ambulatory Visit: Payer: Self-pay

## 2021-03-16 VITALS — BP 130/58 | HR 66 | Wt 223.2 lb

## 2021-03-16 DIAGNOSIS — R300 Dysuria: Secondary | ICD-10-CM | POA: Diagnosis not present

## 2021-03-16 MED ORDER — CEPHALEXIN 500 MG PO CAPS: 500.0000 mg | ORAL_CAPSULE | Freq: Four times a day (QID) | ORAL | 0 refills | Status: AC

## 2021-03-16 NOTE — Assessment & Plan Note (Signed)
Patient reporting 2-day history of dysuria, urgency.  She took Azo this morning.  She was able to leave a sample with [place] today, unclear if UA will be able to be run.  We will also send for culture.  Given patient is having symptoms, will treat with Keflex 4 times daily x7 days.  (Patient had Keflex allergy in her chart, spoke with her about this and we discussed removing this from her chart today.)  It looks like she has been seen a few times in the last year for urinary symptoms, she reports symptoms at previous visits were urgency, but negative UA and cultures. Consider other forms of incontinence? Should she continue to have urinary symptoms without obvious infection, would recommend further work-up and evaluation for other sources.

## 2021-03-17 LAB — URINALYSIS, ROUTINE W REFLEX MICROSCOPIC
Bilirubin, UA: NEGATIVE
Glucose, UA: NEGATIVE
Ketones, UA: NEGATIVE
Nitrite, UA: POSITIVE — AB
Specific Gravity, UA: 1.022 (ref 1.005–1.030)
Urobilinogen, Ur: 1 mg/dL (ref 0.2–1.0)
pH, UA: 5 (ref 5.0–7.5)

## 2021-03-17 LAB — MICROSCOPIC EXAMINATION
Bacteria, UA: NONE SEEN
Casts: NONE SEEN /lpf
RBC, Urine: >30 /hpf — AB (ref 0–2)

## 2021-03-19 ENCOUNTER — Other Ambulatory Visit: Payer: Medicare Other

## 2021-03-21 ENCOUNTER — Other Ambulatory Visit: Payer: Self-pay | Admitting: Family Medicine

## 2021-03-21 DIAGNOSIS — F419 Anxiety disorder, unspecified: Secondary | ICD-10-CM

## 2021-03-22 ENCOUNTER — Encounter: Payer: Self-pay | Admitting: Family Medicine

## 2021-03-22 ENCOUNTER — Other Ambulatory Visit: Payer: Self-pay | Admitting: Family Medicine

## 2021-03-22 DIAGNOSIS — F419 Anxiety disorder, unspecified: Secondary | ICD-10-CM

## 2021-03-22 LAB — URINE CULTURE

## 2021-03-22 MED ORDER — DIAZEPAM 10 MG PO TABS: 10.0000 mg | ORAL_TABLET | ORAL | 0 refills | Status: AC

## 2021-03-22 NOTE — Progress Notes (Signed)
Sent anti-anxiety medications for pt's upcoming MRI to the pharmacy. Valium 10 mg for 2 doses sent to pharmacy. Pt's son will be her driver.   [name], DO PGY-2, [facility] 03/22/2021

## 2021-03-26 ENCOUNTER — Ambulatory Visit
Admission: RE | Admit: 2021-03-26 | Discharge: 2021-03-26 | Disposition: A | Discharge status: Home / Self Care | Payer: Medicare Other | Source: Ambulatory Visit | Attending: Family Medicine | Admitting: Family Medicine

## 2021-03-26 ENCOUNTER — Other Ambulatory Visit: Payer: Medicare Other

## 2021-03-26 DIAGNOSIS — M48061 Spinal stenosis, lumbar region without neurogenic claudication: Secondary | ICD-10-CM | POA: Diagnosis not present

## 2021-03-26 DIAGNOSIS — M5416 Radiculopathy, lumbar region: Secondary | ICD-10-CM

## 2021-03-26 DIAGNOSIS — M545 Low back pain, unspecified: Secondary | ICD-10-CM | POA: Diagnosis not present

## 2021-03-27 ENCOUNTER — Encounter: Payer: Self-pay | Admitting: Family Medicine

## 2021-03-27 DIAGNOSIS — M25559 Pain in unspecified hip: Secondary | ICD-10-CM

## 2021-03-29 ENCOUNTER — Other Ambulatory Visit: Payer: Self-pay | Admitting: Family Medicine

## 2021-03-29 ENCOUNTER — Ambulatory Visit (INDEPENDENT_AMBULATORY_CARE_PROVIDER_SITE_OTHER): Payer: Medicare Other | Admitting: Family Medicine

## 2021-03-29 ENCOUNTER — Encounter: Payer: Self-pay | Admitting: Family Medicine

## 2021-03-29 ENCOUNTER — Other Ambulatory Visit: Payer: Self-pay

## 2021-03-29 ENCOUNTER — Ambulatory Visit: Payer: Medicare Other

## 2021-03-29 VITALS — BP 138/70 | HR 62 | Ht 61.0 in | Wt 227.6 lb

## 2021-03-29 DIAGNOSIS — L989 Disorder of the skin and subcutaneous tissue, unspecified: Secondary | ICD-10-CM | POA: Diagnosis not present

## 2021-03-29 DIAGNOSIS — L821 Other seborrheic keratosis: Secondary | ICD-10-CM

## 2021-03-29 DIAGNOSIS — L570 Actinic keratosis: Secondary | ICD-10-CM | POA: Diagnosis not present

## 2021-03-29 DIAGNOSIS — D0461 Carcinoma in situ of skin of right upper limb, including shoulder: Secondary | ICD-10-CM | POA: Diagnosis not present

## 2021-03-29 NOTE — Progress Notes (Signed)
    SUBJECTIVE:   CHIEF COMPLAINT / HPI:   Patient has "Itchy" areas on face, neck and back.  The ones on her neck have been there a year, while the ones on her back have been there a few years. She has had them frozen off previously. The spot on her forehead was removed previously but came back.  They are not painful.  Patient has no reported history of skin cancer. She does not put any cream on them.  She states she does not go in the sun much and wears sunscreen if she does.   PERTINENT  PMH / PSH: afib on AC  OBJECTIVE:   BP 138/70   Pulse 62   Ht 5\' 1"  (1.549 m)   Wt 227 lb 9.6 oz (103.2 kg)   SpO2 91%   BMI 43.00 kg/m   Gen: alert, oriented. No acute distress. Elderly fair skinned woman, appears stated age.  Skin: fair complexion. Areas of sun damage on face and most prominent on arms..  Several seborrheic keratoses on back of varying sizes.  Some are scaly. 3 small circular  areas of reddened skin on the face. Larger red circular area on the dorsal aspect of the right hand.          ASSESSMENT/PLAN:   Seborrheic keratoses Approximately 10 SK on back were frozen with liquid nitrogen. 2 SK on neck also frozen.  Pt tolerated procedure well.  Liquid nitrogen applied with cotton applicator to 3 areas on the face.  Pt advised to use anti itch lotion such as sarna in the future to avoid perpetuation of itch-scratch cycle.    Actinic keratoses 2 presumed Actinic keratoses on the cheeks bilaterally frozen with liquid nitrogen on applicator tip.  Pt tolerated procedure well.  Another possible AK on the right dorsal hand was removed with punch biopsy and sent for path review. Pt on anticoagulation for Afib. Bleeding controlled with local chemical cauterization.  Pt tolerated procedure well.  Discussed after care and advised on return precautions.    Patient given informed consent, signed copy in the chart.  Appropriate time out taken. Area of biopsy was sterilized and  anesthesized  with 1% lidocaine.    Benay Pike, MD [place]

## 2021-03-29 NOTE — Assessment & Plan Note (Signed)
Approximately 10 SK on back were frozen with liquid nitrogen. 2 SK on neck also frozen.  Pt tolerated procedure well.  Liquid nitrogen applied with cotton applicator to 3 areas on the face.  Pt advised to use anti itch lotion such as sarna in the future to avoid perpetuation of itch-scratch cycle.

## 2021-03-29 NOTE — Progress Notes (Deleted)
   SUBJECTIVE:   CHIEF COMPLAINT / HPI:   No chief complaint on file.    Martha Mora is a 75 y.o. female here for ***   Pt reports ***    PERTINENT  PMH / PSH: reviewed and updated as appropriate   OBJECTIVE:   There were no vitals taken for this visit.  ***  ASSESSMENT/PLAN:   No problem-specific Assessment & Plan notes found for this encounter.     Martha Hensen, DO PGY-2, [facility] 03/29/2021      {    This will disappear when note is signed, click to select method of visit    :1}

## 2021-03-29 NOTE — Assessment & Plan Note (Signed)
2 presumed Actinic keratoses on the cheeks bilaterally frozen with liquid nitrogen on applicator tip.  Pt tolerated procedure well.  Another possible AK on the right dorsal hand was removed with punch biopsy and sent for path review. Pt on anticoagulation for Afib. Bleeding controlled with local chemical cauterization.  Pt tolerated procedure well.  Discussed after care and advised on return precautions.

## 2021-03-29 NOTE — Patient Instructions (Signed)
Visit Information  Ms. Tourangeau  it was nice speaking with you. Please call me directly [phone] if you have questions about the goals we discussed.  Goals Addressed              This Visit's Progress   .  I would like portable oxygen (pt-stated)        Timeframe:  Short-Term Goal Priority:  High Start Date:      02/13/21                       Expected End Date:  06/15/21   .  Avoid smoke and air pollution . Exercise on a regular basis . Keep your airway clear from mucus build up  . Control your cough by drinking plenty of water . Use a humidifier, if needed . Visit your doctor on a regular basis . Receive your annual flu vaccine   Why is this important?    Having a long-term illness can be scary.   It can also be stressful for you and your caregiver.   These steps may help.    Notes:        The patient verbalized understanding of instructions, educational materials, and care plan provided today and declined offer to receive copy of patient instructions, educational materials, and care plan.   Follow up Plan: Patient would like continued follow-up.  CCM RNCM will outreach the patient within the next 6 weeks.  Patient will call office if needed prior to next encounter  Lazaro Arms, RN  [phone]

## 2021-03-29 NOTE — Chronic Care Management (AMB) (Signed)
Care Management    RN Visit Note  03/29/2021 Name: Martha Mora MRN: [id number] DOB: Jul 14, 1946  Subjective: Martha Mora is a 75 y.o. year old female who is a primary care patient of [name], DO. The care management team was consulted for assistance with disease management and care coordination needs.    Engaged with patient by telephone for follow up visit in response to provider referral for case management and/or care coordination services.  The patient was given information about Chronic Care Management services, agreed to services, and gave verbal consent prior to initiation of services.  Please see initial visit note for detailed documentation. Patient agreed to services and consent obtained.    Assessment: The patient is going to be evaluated for her oxygen. See Care Plan below for interventions and patient self-care actives. Follow up Plan: Patient would like continued follow-up.  CCM RNCM will outreach the patient within the next 6 weeks  Patient will call office if needed prior to next encounter  Review of patient past medical history, allergies, medications, health status, including review of consultants reports, laboratory and other test data, was performed as part of comprehensive evaluation and provision of chronic care management services.   SDOH (Social Determinants of Health) assessments and interventions performed:    Care Plan  Allergies  Allergen Reactions  . Keflex [Cephalexin] Other (See Comments)    "Made me feel funny"  Tolerated Rocephin in April 2020 over several days and 07/01/19 in ED.  [name] Penicillins Rash and Other (See Comments)    03/01/19 tolerated Zosyn Red bumps all over stomach Did it involve swelling of the face/tongue/throat, SOB, or low BP? No Did it involve sudden or severe rash/hives, skin peeling, or any reaction on the inside of your mouth or nose? Yes Did you need to seek medical attention at a hospital or doctor's office?  Yes When did it last happen?30+ years  If all above answers are "NO", may proceed with cephalosporin use.  03/01/19 tolerated Zosyn Red bumps all over stomach Did it involve swelling of the face/tongue/throat, SOB, or low BP? No Did it involve sudden or severe rash/hives, skin peeling, or any reaction on the inside of your mouth or nose? Yes Did you need to seek medical attention at a hospital or doctor's office? Yes When did it last happen?30+ years  If all above answers are "NO", may proceed with cephalosporin use.  . Ropinirole Hcl Other (See Comments)    "Could not stop moving" likely akathisia occurred in 2019  . Shellfish Allergy Nausea And Vomiting    Outpatient Encounter Medications as of 03/29/2021  Medication Sig Note  . acetaminophen (TYLENOL) 500 MG tablet Take 1,500 mg by mouth 2 (two) times daily. 11/01/2019: Take 3 in the morning and 4 at night.  [name] albuterol (VENTOLIN HFA) 108 (90 Base) MCG/ACT inhaler 1-2 inhalations every 4-6 hours as needed for wheezing. Dispense spacer as needed.   [name] amiodarone (PACERONE) 200 MG tablet Take 1 tablet (200 mg total) by mouth daily.   [name] amLODipine (NORVASC) 5 MG tablet Take 2 tablets (10 mg total) by mouth daily.   . busPIRone (BUSPAR) 10 MG tablet TAKE 1 TABLET BY MOUTH THREE TIMES A DAY   . Calcium Carb-Cholecalciferol 600-800 MG-UNIT TABS Take 1 tablet by mouth 2 (two) times a day.   . [employer]S MELATONIN 3 MG TABS TAKE 1 TABLET (3 MG TOTAL) BY MOUTH AT BEDTIME AS NEEDED (FOR SLEEP). (Patient not taking: No sig  reported)   . DULoxetine (CYMBALTA) 60 MG capsule TAKE 1 CAPSULE BY MOUTH EVERY DAY   . ELIQUIS 5 MG TABS tablet TAKE 1 TABLET BY MOUTH TWICE A DAY   . losartan (COZAAR) 100 MG tablet Take 100 mg by mouth daily.    . metoprolol tartrate (LOPRESSOR) 25 MG tablet Take 0.5 tablets (12.5 mg total) by mouth 2 (two) times daily. 02/13/2021: Patient takes whole tablet in the morning  . Multiple Vitamin (MULTIVITAMIN) tablet Take 1  tablet by mouth daily. Herbal Life multivitamin   . Multiple Vitamins-Minerals (PRESERVISION AREDS 2 PO) Take 1 tablet by mouth daily.   [name] omeprazole (PRILOSEC) 40 MG capsule Take 1 capsule (40 mg total) by mouth daily.   . pregabalin (LYRICA) 75 MG capsule Take 1 capsule (75 mg total) by mouth 2 (two) times daily.   [name] PROLIA 60 MG/ML SOSY injection Inject 60 mg into the skin every 6 (six) months.  11/23/2019: Daughter recalls Dec/2020  . QUEtiapine (SEROQUEL) 25 MG tablet TAKE 1 TABLET BY MOUTH EVERYDAY AT BEDTIME   . rosuvastatin (CRESTOR) 20 MG tablet Take 20 mg by mouth daily.    . traZODone (DESYREL) 100 MG tablet TAKE 0.5 TABLETS (50 MG TOTAL) BY MOUTH AT BEDTIME AS NEEDED FOR SLEEP. 02/13/2021: Patient takes 11/2 a day  . Vitamin D, Ergocalciferol, (DRISDOL) 1.25 MG (50000 UNIT) CAPS capsule TAKE 1 CAPSULE BY MOUTH ONE TIME PER WEEK   . zinc gluconate 50 MG tablet Take 50 mg by mouth daily. (Patient not taking: No sig reported)    No facility-administered encounter medications on file as of 03/29/2021.    Patient Active Problem List   Diagnosis Date Noted  . Actinic keratoses 03/29/2021  . Dysuria 03/16/2021  . 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 Genetic testing 02/12/2021  . Chronic obstructive pulmonary disease ([place]) 02/05/2021  . Lumbar radiculopathy 02/05/2021  . Atrial fibrillation ([place]) 01/09/2021  . Vitamin D deficiency 01/09/2021  . Seborrheic keratoses 01/09/2021  . Urinary urgency 11/21/2020  . Cough with sputum 07/06/2020  . Vertigo 06/12/2020  . Vocal cord dysfunction 06/12/2020  . Chronic respiratory failure ([place]) 06/12/2020  . History of atrial fibrillation 02/15/2020  . CAP (community acquired pneumonia) 11/23/2019  . Delirium   . 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 07/16/2019  . Renal stones 03/15/2019  . Acute respiratory failure with hypoxia ([place]) 02/22/2019  . Malodorous urine 10/06/2018  . Insomnia 09/08/2016  . Urinary incontinence, mixed 10/25/2015   . Restless leg syndrome 05/24/2015  . History of tobacco use 05/23/2014  . Mitral valve regurgitation 05/23/2014  . Dysfunctional uterine bleeding 09/02/2013  . Breast cancer of lower-outer quadrant of right female breast ([place]) 07/24/2013  . Postmenopausal bleeding 07/14/2013  . Osteopenia 12/27/2011  . Anxiety 07/19/2009  . Osteoarthritis 07/19/2009  . NAUSEA 07/19/2009  . IRRITABLE BOWEL SYNDROME 11/17/2008  . PERSONAL HX COLONIC POLYPS 07/19/2008  . COLONIC POLYPS, ADENOMATOUS 11/17/2007  . History of breast cancer 11/17/2007  . Hyperlipidemia 11/17/2007  . Obesity, unspecified 11/17/2007  . Depression 11/17/2007  . Essential hypertension 11/17/2007  . Coronary atherosclerosis 11/17/2007  . HEMORRHOIDS, INTERNAL 11/17/2007  . GERD 11/17/2007  . PEPTIC STRICTURE 11/17/2007  . OSA (obstructive sleep apnea) 11/17/2007  . Diverticulosis of colon (without mention of hemorrhage) 09/05/2005  . REFLUX ESOPHAGITIS 06/01/2002  . ESOPHAGEAL STRICTURE 06/01/2002    Conditions to be addressed/monitored: Oxygen  Care Plan : RN Case Manager  Updates made by Lazaro Arms, RN since 03/29/2021 12:00 AM  Problem: Quality of Life COPD   Goal: Quality of Life Maintained by using portable Oxygen   Start Date: 02/13/2021  Expected End Date: 04/17/2021  Priority: High   . Care Coordination needs related to needed DME/Supplies in a patient with COPD  Nurse Case Manager Clinical Goal(s):  [name] Over the next 14 days, patient will verbalize understanding of plan to obtain needed DME.  . Over the next 21 days, patient will work with care guide and/or [facility]  to address needs related to DME needs.  Interventions:  . Inter-disciplinary care team collaboration (see longitudinal plan of care) . Collaboration with/confirmation of receipt of DME orders at [facility]  . Evaluation of current treatment plan related to primary care provider's note on 01/09/21 and patient's adherence to plan as established by  provider. . Reviewed medications with patient and discussed of taking medication- patient reports that she had thrown her Seroquel away.  She is going to call the pharmacy to see if she has a refill so that she can startt to take the medication. . Discussed plans with patient for ongoing care management follow up and provided patient with direct contact information for care management team . Will send the patient COPD folder . Explain to the patient that her information will be sent to [facility] for the portable oxygen and she will need to have a Fit test done for portable oxygen. [name] Spoke with the patient and she reports that she has an appointment on June 7th for an evaluation at 1 pm.  I advised the patient to ask about payment if it will be with insurance or will she need to pay out of pocket.  That is an important question to ask.   Patient Goals/Self-Care Activities: . Avoid smoke and air pollution . Exercise on a regular basis . Keep your airway clear from mucus build up  . Control your cough by drinking plenty of water . Use a humidifier, if needed . Visit your doctor on a regular basis . Receive your annual flu vaccine       Lazaro Arms RN, BSN, [facility] Care Management Coordinator [place] Phone: [phone] I Fax: [phone]

## 2021-03-29 NOTE — Patient Instructions (Signed)
Today we froze off several lesions from your back, face, and chest. If you have itching you can use Sarna over the counter cream. We also took a biopsy of the lesion on your right hand and will call you with the results when they come back. Follow the wound care instructions in the handout given.    Seborrheic Keratosis A seborrheic keratosis is a common, noncancerous (benign) skin growth. These growths are velvety, waxy, rough, tan, brown, or black spots that appear on the skin. These skin growths can be flat or raised, and scaly. What are the causes? The cause of this condition is not known. What increases the risk? You are more likely to develop this condition if you:  Have a family history of seborrheic keratosis.  Are 50 or older.  Are pregnant.  Have had estrogen replacement therapy. What are the signs or symptoms? Symptoms of this condition include growths on the face, chest, shoulders, back, or other areas. These growths:  Are usually painless, but may become irritated and itchy.  Can be yellow, brown, black, or other colors.  Are slightly raised or have a flat surface.  Are sometimes rough or wart-like in texture.  Are often velvety or waxy on the surface.  Are round or oval-shaped.  Often occur in groups, but may occur as a single growth.   How is this diagnosed? This condition is diagnosed with a medical history and physical exam.  A sample of the growth may be tested (skin biopsy).  You may need to see a skin specialist (dermatologist). How is this treated? Treatment is not usually needed for this condition, unless the growths are irritated or bleed often.  You may also choose to have the growths removed if you do not like their appearance. ? Most commonly, these growths are treated with a procedure in which liquid nitrogen is applied to "freeze" off the growth (cryosurgery). ? They may also be burned off with electricity (electrocautery) or removed by  scraping (curettage). Follow these instructions at home:  Watch your growth for any changes.  Keep all follow-up visits as told by your health care provider. This is important.  Do not scratch or pick at the growth or growths. This can cause them to become irritated or infected. Contact a health care provider if:  You suddenly have many new growths.  Your growth bleeds, itches, or hurts.  Your growth suddenly becomes larger or changes color. Summary  A seborrheic keratosis is a common, noncancerous (benign) skin growth.  Treatment is not usually needed for this condition, unless the growths are irritated or bleed often.  Watch your growth for any changes.  Contact a health care provider if you suddenly have many new growths or your growth suddenly becomes larger or changes color.  Keep all follow-up visits as told by your health care provider. This is important. This information is not intended to replace advice given to you by your health care provider. Make sure you discuss any questions you have with your health care provider. Document Revised: 03/19/2018 Document Reviewed: 03/19/2018 Elsevier Patient Education  2021 [employer].

## 2021-03-30 ENCOUNTER — Ambulatory Visit: Payer: Medicare Other | Admitting: Family Medicine

## 2021-04-02 ENCOUNTER — Encounter (INDEPENDENT_AMBULATORY_CARE_PROVIDER_SITE_OTHER): Payer: Medicare Other | Admitting: Ophthalmology

## 2021-04-03 DIAGNOSIS — M25551 Pain in right hip: Secondary | ICD-10-CM | POA: Diagnosis not present

## 2021-04-03 DIAGNOSIS — M75112 Incomplete rotator cuff tear or rupture of left shoulder, not specified as traumatic: Secondary | ICD-10-CM | POA: Diagnosis not present

## 2021-04-03 DIAGNOSIS — M7061 Trochanteric bursitis, right hip: Secondary | ICD-10-CM | POA: Diagnosis not present

## 2021-04-03 DIAGNOSIS — M25512 Pain in left shoulder: Secondary | ICD-10-CM | POA: Diagnosis not present

## 2021-04-05 ENCOUNTER — Other Ambulatory Visit: Payer: Self-pay

## 2021-04-05 ENCOUNTER — Ambulatory Visit (INDEPENDENT_AMBULATORY_CARE_PROVIDER_SITE_OTHER)
Admission: RE | Admit: 2021-04-05 | Discharge: 2021-04-05 | Disposition: A | Discharge status: Home / Self Care | Payer: Medicare Other | Source: Ambulatory Visit | Attending: Obstetrics & Gynecology | Admitting: Obstetrics & Gynecology

## 2021-04-05 DIAGNOSIS — Z87891 Personal history of nicotine dependence: Secondary | ICD-10-CM | POA: Diagnosis not present

## 2021-04-09 ENCOUNTER — Encounter (INDEPENDENT_AMBULATORY_CARE_PROVIDER_SITE_OTHER): Payer: Medicare Other | Admitting: Ophthalmology

## 2021-04-09 ENCOUNTER — Telehealth: Payer: Self-pay | Admitting: Family Medicine

## 2021-04-09 NOTE — Telephone Encounter (Signed)
Discussed skin biopsy report with her - Showed SCC.  Treatment option discussed. Given the lesion size is no greater than 1 cm, cryotherapy will be beneficial  Unfortunately, there is currently no openings in Derm clinic. However, I was able to get her into Gyn clinic for June 30 for treatment.  F/U sooner if needed.   NB: She stated that the biopsy site healed very well. Monitor closely.

## 2021-04-19 ENCOUNTER — Inpatient Hospital Stay: Payer: Medicare Other | Attending: Hematology

## 2021-04-19 ENCOUNTER — Other Ambulatory Visit: Payer: Self-pay

## 2021-04-19 ENCOUNTER — Inpatient Hospital Stay: Payer: Medicare Other

## 2021-04-19 ENCOUNTER — Other Ambulatory Visit: Payer: Self-pay | Admitting: Hematology

## 2021-04-19 VITALS — BP 117/54 | HR 57 | Temp 99.3°F | Resp 18

## 2021-04-19 DIAGNOSIS — Z170 Estrogen receptor positive status [ER+]: Secondary | ICD-10-CM

## 2021-04-19 DIAGNOSIS — M8580 Other specified disorders of bone density and structure, unspecified site: Secondary | ICD-10-CM | POA: Diagnosis not present

## 2021-04-19 DIAGNOSIS — C50511 Malignant neoplasm of lower-outer quadrant of right female breast: Secondary | ICD-10-CM

## 2021-04-19 LAB — BASIC METABOLIC PANEL - CANCER CENTER ONLY
Anion gap: 9 (ref 5–15)
BUN: 17 mg/dL (ref 8–23)
CO2: 27 mmol/L (ref 22–32)
Calcium: 9.7 mg/dL (ref 8.9–10.3)
Chloride: 106 mmol/L (ref 98–111)
Creatinine: 0.81 mg/dL (ref 0.44–1.00)
GFR, Estimated: >60 mL/min (ref >60)
Glucose, Bld: 113 mg/dL — ABNORMAL HIGH (ref 70–99)
Potassium: 4.4 mmol/L (ref 3.5–5.1)
Sodium: 142 mmol/L (ref 135–145)

## 2021-04-19 MED ORDER — DENOSUMAB 60 MG/ML ~~LOC~~ SOSY
PREFILLED_SYRINGE | SUBCUTANEOUS | Status: AC
  Filled 2021-04-19: qty 1

## 2021-04-19 MED ORDER — DENOSUMAB 60 MG/ML ~~LOC~~ SOSY
60.0000 mg | PREFILLED_SYRINGE | Freq: Once | SUBCUTANEOUS | Status: AC
  Administered 2021-04-19: 60 mg via SUBCUTANEOUS

## 2021-04-19 NOTE — Patient Instructions (Signed)
Denosumab injection What is this medicine? DENOSUMAB (den oh sue mab) slows bone breakdown. Prolia is used to treat osteoporosis in women after menopause and in men, and in people who are taking corticosteroids for 6 months or more. Xgeva is used to treat a high calcium level due to cancer and to prevent bone fractures and other bone problems caused by multiple myeloma or cancer bone metastases. Xgeva is also used to treat giant cell tumor of the bone. This medicine may be used for other purposes; ask your health care provider or pharmacist if you have questions. COMMON BRAND NAME(S): Prolia, XGEVA What should I tell my health care provider before I take this medicine? They need to know if you have any of these conditions:  dental disease  having surgery or tooth extraction  infection  kidney disease  low levels of calcium or Vitamin D in the blood  malnutrition  on hemodialysis  skin conditions or sensitivity  thyroid or parathyroid disease  an unusual reaction to denosumab, other medicines, foods, dyes, or preservatives  pregnant or trying to get pregnant  breast-feeding How should I use this medicine? This medicine is for injection under the skin. It is given by a health care professional in a hospital or clinic setting. A special MedGuide will be given to you before each treatment. Be sure to read this information carefully each time. For Prolia, talk to your pediatrician regarding the use of this medicine in children. Special care may be needed. For Xgeva, talk to your pediatrician regarding the use of this medicine in children. While this drug may be prescribed for children as young as 13 years for selected conditions, precautions do apply. Overdosage: If you think you have taken too much of this medicine contact a poison control center or emergency room at once. NOTE: This medicine is only for you. Do not share this medicine with others. What if I miss a dose? It is  important not to miss your dose. Call your doctor or health care professional if you are unable to keep an appointment. What may interact with this medicine? Do not take this medicine with any of the following medications:  other medicines containing denosumab This medicine may also interact with the following medications:  medicines that lower your chance of fighting infection  steroid medicines like prednisone or cortisone This list may not describe all possible interactions. Give your health care provider a list of all the medicines, herbs, non-prescription drugs, or dietary supplements you use. Also tell them if you smoke, drink alcohol, or use illegal drugs. Some items may interact with your medicine. What should I watch for while using this medicine? Visit your doctor or health care professional for regular checks on your progress. Your doctor or health care professional may order blood tests and other tests to see how you are doing. Call your doctor or health care professional for advice if you get a fever, chills or sore throat, or other symptoms of a cold or flu. Do not treat yourself. This drug may decrease your body's ability to fight infection. Try to avoid being around people who are sick. You should make sure you get enough calcium and vitamin D while you are taking this medicine, unless your doctor tells you not to. Discuss the foods you eat and the vitamins you take with your health care professional. See your dentist regularly. Brush and floss your teeth as directed. Before you have any dental work done, tell your dentist you are   receiving this medicine. Do not become pregnant while taking this medicine or for 5 months after stopping it. Talk with your doctor or health care professional about your birth control options while taking this medicine. Women should inform their doctor if they wish to become pregnant or think they might be pregnant. There is a potential for serious side  effects to an unborn child. Talk to your health care professional or pharmacist for more information. What side effects may I notice from receiving this medicine? Side effects that you should report to your doctor or health care professional as soon as possible:  allergic reactions like skin rash, itching or hives, swelling of the face, lips, or tongue  bone pain  breathing problems  dizziness  jaw pain, especially after dental work  redness, blistering, peeling of the skin  signs and symptoms of infection like fever or chills; cough; sore throat; pain or trouble passing urine  signs of low calcium like fast heartbeat, muscle cramps or muscle pain; pain, tingling, numbness in the hands or feet; seizures  unusual bleeding or bruising  unusually weak or tired Side effects that usually do not require medical attention (report to your doctor or health care professional if they continue or are bothersome):  constipation  diarrhea  headache  joint pain  loss of appetite  muscle pain  runny nose  tiredness  upset stomach This list may not describe all possible side effects. Call your doctor for medical advice about side effects. You may report side effects to FDA at 1-800-FDA-1088. Where should I keep my medicine? This medicine is only given in a clinic, doctor's office, or other health care setting and will not be stored at home. NOTE: This sheet is a summary. It may not cover all possible information. If you have questions about this medicine, talk to your doctor, pharmacist, or health care provider.  2021 Elsevier/Gold Standard (2018-03-13 16:10:44)

## 2021-04-19 NOTE — Progress Notes (Signed)
Please call patient and let them  know their  low dose Ct was read as a Lung RADS 2: nodules that are benign in appearance and behavior with a very low likelihood of becoming a clinically active cancer due to size or lack of growth. Recommendation per radiology is for a repeat LDCT in 12 months. .Please let them  know we will order and schedule their  annual screening scan for 03/2022. Please let them  know there was notation of CAD on their  scan.  Please remind the patient  that this is a non-gated exam therefore degree or severity of disease  cannot be determined. Please have them  follow up with their PCP regarding potential risk factor modification, dietary therapy or pharmacologic therapy if clinically indicated. Pt.  is  currently on statin therapy. Please place order for annual  screening scan for  03/2022 and fax results to PCP. Thanks so much.  Pt will be 76 at next screening date. Let's see if her insurance pays, if not we can discuss if she would like to continue through the practice.   CT showed dilation of the  pulmonic trunk, concerning for pulmonary htn. She had an echo 1 year ago. Can you confirm with her that she is following up with cards? Thanks so much.

## 2021-04-20 DIAGNOSIS — M5441 Lumbago with sciatica, right side: Secondary | ICD-10-CM | POA: Diagnosis not present

## 2021-04-20 DIAGNOSIS — M25551 Pain in right hip: Secondary | ICD-10-CM | POA: Diagnosis not present

## 2021-04-23 ENCOUNTER — Other Ambulatory Visit: Payer: Self-pay

## 2021-04-23 ENCOUNTER — Encounter (INDEPENDENT_AMBULATORY_CARE_PROVIDER_SITE_OTHER): Payer: Medicare Other | Admitting: Ophthalmology

## 2021-04-23 DIAGNOSIS — H33302 Unspecified retinal break, left eye: Secondary | ICD-10-CM | POA: Diagnosis not present

## 2021-04-23 DIAGNOSIS — H35033 Hypertensive retinopathy, bilateral: Secondary | ICD-10-CM

## 2021-04-23 DIAGNOSIS — I10 Essential (primary) hypertension: Secondary | ICD-10-CM

## 2021-04-23 DIAGNOSIS — H43813 Vitreous degeneration, bilateral: Secondary | ICD-10-CM | POA: Diagnosis not present

## 2021-04-23 DIAGNOSIS — H353231 Exudative age-related macular degeneration, bilateral, with active choroidal neovascularization: Secondary | ICD-10-CM

## 2021-04-24 ENCOUNTER — Telehealth: Payer: Self-pay | Admitting: Acute Care

## 2021-04-24 DIAGNOSIS — Z87891 Personal history of nicotine dependence: Secondary | ICD-10-CM

## 2021-04-24 NOTE — Telephone Encounter (Signed)
Routing to [employer]. thanks

## 2021-04-25 NOTE — Telephone Encounter (Signed)
LMTC x 1  

## 2021-05-01 ENCOUNTER — Other Ambulatory Visit: Payer: Self-pay | Admitting: Family Medicine

## 2021-05-01 ENCOUNTER — Encounter: Payer: Self-pay | Admitting: *Deleted

## 2021-05-01 DIAGNOSIS — G4700 Insomnia, unspecified: Secondary | ICD-10-CM

## 2021-05-01 NOTE — Telephone Encounter (Signed)
LMOM for pt that I will mail a letter with CT results. Also left call back number if pt has any further questions.

## 2021-05-10 ENCOUNTER — Ambulatory Visit: Payer: Medicare Other | Admitting: Licensed Clinical Social Worker

## 2021-05-10 DIAGNOSIS — Z7689 Persons encountering health services in other specified circumstances: Secondary | ICD-10-CM

## 2021-05-10 NOTE — Patient Instructions (Signed)
Visit Information  Your phone appointment was rescheduled with Traci on July 11th at 12:00  Patient verbalizes understanding of instructions provided today and agrees to view in [place].    Casimer Lanius, LCSW Care Management & Coordination  [phone]

## 2021-05-10 NOTE — Chronic Care Management (AMB) (Addendum)
    Clinical Social Work  Care Management   Phone Outreach    05/10/2021 Name: Cameka Rae MRN: [id number] DOB: Sep 01, 1946  Laveda Demedeiros is a 75 y.o. year old female who is a primary care patient of Brimage, Ronnette Juniper, DO .   F/U phone appointment scheduled with CCM RN today to assess needs, and progress with care plan goals.  CCM LCSW providing coverage.  Patient reports no immediate needs or concerns for CCM RN.  She continues to work on getting her portable O2.  Had to reschedule the appointment with [facility].  Plan:Appointment was rescheduled with CCM RN for July 11th at 12:00  Review of patient status, including review of consultants reports, relevant laboratory and other test results, and collaboration with appropriate care team members and the patient's provider was performed as part of comprehensive patient evaluation and provision of care management services.    Casimer Lanius, [place] [facility] [place]   [phone] 12:12 PM

## 2021-05-17 ENCOUNTER — Other Ambulatory Visit: Payer: Self-pay

## 2021-05-17 ENCOUNTER — Ambulatory Visit (INDEPENDENT_AMBULATORY_CARE_PROVIDER_SITE_OTHER): Payer: Medicare Other | Admitting: Family Medicine

## 2021-05-17 DIAGNOSIS — L989 Disorder of the skin and subcutaneous tissue, unspecified: Secondary | ICD-10-CM

## 2021-05-17 DIAGNOSIS — I10 Essential (primary) hypertension: Secondary | ICD-10-CM | POA: Diagnosis not present

## 2021-05-17 DIAGNOSIS — C44622 Squamous cell carcinoma of skin of right upper limb, including shoulder: Secondary | ICD-10-CM | POA: Diagnosis not present

## 2021-05-17 NOTE — Progress Notes (Signed)
SUBJECTIVE:   CHIEF COMPLAINT / HPI:   SCC of right hand dorsum - visit 03/29/21 with Dr. Jeannine Kitten for multiple skin concerns - punch biopsy obtained from dorsum of right hand - pathology indicated SCC - returned today for curative cryotherapy treatment  Skin lesions - patient also requests we apply cryotherapy to skin lesions on arm, forehead, cheek, and left clavicle - after review, only lesions on right upper arm, left forehead, and left eyebrow are eligible for cryotherapy  PERTINENT  PMH / PSH: actinic keratoses, seborrheic keratoses, post menopausal uterine bleeding, HTN, A. Fib on anticoagulation, COPD  OBJECTIVE:   BP (!) 97/53   Pulse 65   Wt 233 lb 3.2 oz (105.8 kg)   SpO2 98%   BMI 44.06 kg/m   PHQ-9:  Depression screen [facility] 2/9 05/17/2021 03/29/2021 03/16/2021  Decreased Interest 1 1 0  Down, Depressed, Hopeless 1 1 0  PHQ - 2 Score 2 2 0  Altered sleeping 2 1 2   Tired, decreased energy 2 1 1   Change in appetite 0 0 0  Feeling bad or failure about yourself  1 0 1  Trouble concentrating 0 0 0  Moving slowly or fidgety/restless 0 0 0  Suicidal thoughts 0 0 0  PHQ-9 Score 7 4 4   Difficult doing work/chores Somewhat difficult - -  Some recent data might be hidden     GAD-7:  GAD 7 : Generalized Anxiety Score 03/05/2021 02/05/2021 12/02/2019 08/31/2019  Nervous, Anxious, on Edge 3 3 3 3   Control/stop worrying 1 1 2 2   Worry too much - different things 1 1 2 2   Trouble relaxing 1 1 3 3   Restless 2 1 3 2   Easily annoyed or irritable 1 0 2 1  Afraid - awful might happen 0 0 2 1  Total GAD 7 Score 9 7 17 14   Anxiety Difficulty - - Very difficult Very difficult     Physical Exam General: Awake, alert, oriented, no acute distress Respiratory: Unlabored respirations, speaking in full sentences, no respiratory distress Extremities: Moving all extremities spontaneously Neuro: Cranial nerves II through X grossly intact Psych: Normal insight and judgement Skin:  right hand dorsum lesion - erythematous plaque with irregular borders and overlying scale appox 60mm diameter; right upper arm - mildly erythematous macule with scaly irregular borders approx 5 mm diameter, left forehead - mildly erythematous macule with scaly irregular borders approx 3 mm diameter             PROCEDURE:   Cryotherapy  Preoperative diagnosis: 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 Jeani Hawking Supervisor: Dr. Gwendlyn Deutscher Preprocedure counseling: The risks, benefits, and alternatives of the procedure were discussed with the patient.   EBL: 0 ml Anesthesia: None  Lesions identified: 1. Right dorsal hand 1cm x 0.5 cm 2. Right lateral arm 3. Central forehead  4. Right superior eyelid  A test freeze was performed ensuring coverage of entire area as above.  The cryotherapy gun was then applied for 3 seconds until an ice ball formed with a 5-7 mm border.  This was allowed to thaw and then the cryotherapy was again applied for 3 seconds to an ice ball of 5-7 mm.    The patient tolerated the procedure well.  Return precautions provided.  Return to the office in 32-months for reevaluation.    Ezequiel Essex, M.D.  Family Medicine  PGY-1  ASSESSMENT/PLAN:   Essential hypertension With hypotension today- BP 97/53. Endorses some lightheadedness today. Unfortunately, patient did not bring in her medications to visit today and is unsure of what she is taking- cannot rely on med rec as her prescriptions appear old. She follows with Cardiology who manages her blood pressure. Instructed to patient to discuss her hypotension with her Cardiologist ASAP. No medication changes today. Return precautions given to patient. Encouraged hydration and caution with walking/getting up to avoid falls. She has BP cuff at home to monitor.   SCC (squamous cell carcinoma), hand,  right Diagnosed with punch biopsy 03/29/21. Treated today with cryotherapy. Return precautions given. Instructed to follow up with derm clinic in 3 months.      Ezequiel Essex, MD [place]

## 2021-05-17 NOTE — Assessment & Plan Note (Addendum)
With hypotension today- BP 97/53. Endorses some lightheadedness today. Unfortunately, patient did not bring in her medications to visit today and is unsure of what she is taking- cannot rely on med rec as her prescriptions appear old. She follows with Cardiology who manages her blood pressure. Instructed to patient to discuss her hypotension with her Cardiologist ASAP. No medication changes today. Return precautions given to patient. Encouraged hydration and caution with walking/getting up to avoid falls. She has BP cuff at home to monitor.

## 2021-05-17 NOTE — Patient Instructions (Addendum)
It was wonderful to meet you today! Thank you for allowing me to be a part of your care. Below is a short summary of what we discussed at your visit today:  We performed cryotherapy to your right hand, right arm and forehead. You can expect these areas to blister in the next couple of days and then they should fall off.   Please return in 40-months for a follow up on your right hand. This dermatology appointment follow up will need to be in September. We will call you in late July to schedule this.   Please bring all of your medications to every appointment!  If you have any questions or concerns, please do not hesitate to contact us via phone or MyChart message.   Ezequiel Essex, MD

## 2021-05-17 NOTE — Assessment & Plan Note (Signed)
Diagnosed with punch biopsy 03/29/21. Treated today with cryotherapy. Return precautions given. Instructed to follow up with derm clinic in 3 months.

## 2021-05-22 DIAGNOSIS — M533 Sacrococcygeal disorders, not elsewhere classified: Secondary | ICD-10-CM | POA: Diagnosis not present

## 2021-05-25 ENCOUNTER — Ambulatory Visit: Payer: Medicare Other | Admitting: Family Medicine

## 2021-05-28 ENCOUNTER — Telehealth: Payer: Self-pay | Admitting: *Deleted

## 2021-05-28 ENCOUNTER — Telehealth: Payer: Medicare Other

## 2021-05-28 NOTE — Telephone Encounter (Signed)
  Care Management   Outreach Note  05/28/2021 Name: Martha Mora MRN: [id number] DOB: 11-30-1945  Referred by: [name], DO Reason for referral : Care Coordination (COPD, GERD, Obesity, HLD, CAD, HTN)   An unsuccessful telephone outreach was attempted today. The patient was referred to the case management team for assistance with care management and care coordination.   Follow Up Plan: A HIPAA compliant phone message was left for the patient providing contact information and requesting a return call.  The care management team will reach out to the patient again over the next 7 days.   SIGNATURE

## 2021-05-30 ENCOUNTER — Other Ambulatory Visit: Payer: Self-pay | Admitting: Family Medicine

## 2021-05-30 ENCOUNTER — Other Ambulatory Visit: Payer: Self-pay | Admitting: Nurse Practitioner

## 2021-05-30 DIAGNOSIS — M5431 Sciatica, right side: Secondary | ICD-10-CM

## 2021-05-31 ENCOUNTER — Inpatient Hospital Stay: Admission: RE | Admit: 2021-05-31 | Payer: Medicare Other | Source: Ambulatory Visit

## 2021-06-04 ENCOUNTER — Encounter (INDEPENDENT_AMBULATORY_CARE_PROVIDER_SITE_OTHER): Payer: Medicare Other | Admitting: Ophthalmology

## 2021-06-04 ENCOUNTER — Ambulatory Visit: Payer: Medicare Other

## 2021-06-05 NOTE — Patient Instructions (Signed)
Visit Information  Martha Mora  it was nice speaking with you. Please call me directly [phone] if you have questions about the goals we discussed.   Goals Addressed               This Visit's Progress     I would like portable oxygen (pt-stated)        Timeframe:  Short-Term Goal Priority:  High Start Date:      02/13/21                       Expected End Date:  07/17/21    Avoid smoke and air pollution Exercise on a regular basis Keep your airway clear from mucus build up  Control your cough by drinking plenty of water Use a humidifier, if needed Visit your doctor on a regular basis Receive your annual flu vaccine   Why is this important?   Having a long-term illness can be scary.  It can also be stressful for you and your caregiver.  These steps may help.    Notes:         The patient verbalized understanding of instructions, educational materials, and care plan provided today and declined offer to receive copy of patient instructions, educational materials, and care plan.   Follow up Plan: Patient would like continued follow-up.  CCM RNCM will outreach the patient within the next 10 days  Patient will call office if needed prior to next encounter  Lazaro Arms, RN  [phone]

## 2021-06-05 NOTE — Chronic Care Management (AMB) (Signed)
Care Management    RN Visit Note  06/05/2021 Name: Martha Mora MRN: [id number] DOB: 07-Sep-1946  Subjective: Martha Mora is a 75 y.o. year old female who is a primary care patient of Martha Hensen, DO. The care management team was consulted for assistance with disease management and care coordination needs.    Engaged with patient by telephone for follow up visit in response to provider referral for case management and/or care coordination services.   Consent to Services:   Martha Mora was given information about Care Management services today including:  Care Management services includes personalized support from designated clinical staff supervised by her physician, including individualized plan of care and coordination with other care providers 24/7 contact phone numbers for assistance for urgent and routine care needs. The patient may stop case management services at any time by phone call to the office staff.  Patient agreed to services and consent obtained.    Assessment:  The patient is still having skepticism that [facility] will have the type of oxygen she is looking for.  I advised her to talk with them face to face so that she can see what type of products they have and then decide . See Care Plan below for interventions and patient self-care actives. Follow up Plan: Patient would like continued follow-up.  CCM RNCM will outreach the patient within the next 10 days.  Patient will call office if needed prior to next encounter Review of patient past medical history, allergies, medications, health status, including review of consultants reports, laboratory and other test data, was performed as part of comprehensive evaluation and provision of chronic care management services.   SDOH (Social Determinants of Health) assessments and interventions performed:    Care Plan  Allergies  Allergen Reactions   Keflex [Cephalexin] Other (See Comments)    "Made me feel  funny"  Tolerated Rocephin in April 2020 over several days and 07/01/19 in ED.   Penicillins Rash and Other (See Comments)    03/01/19 tolerated Zosyn Red bumps all over stomach Did it involve swelling of the face/tongue/throat, SOB, or low BP? No Did it involve sudden or severe rash/hives, skin peeling, or any reaction on the inside of your mouth or nose? Yes Did you need to seek medical attention at a hospital or doctor's office? Yes When did it last happen?      30+ years  If all above answers are "NO", may proceed with cephalosporin use.  03/01/19 tolerated Zosyn Red bumps all over stomach Did it involve swelling of the face/tongue/throat, SOB, or low BP? No Did it involve sudden or severe rash/hives, skin peeling, or any reaction on the inside of your mouth or nose? Yes Did you need to seek medical attention at a hospital or doctor's office? Yes When did it last happen?      30+ years  If all above answers are "NO", may proceed with cephalosporin use.   Ropinirole Hcl Other (See Comments)    "Could not stop moving" likely akathisia occurred in 2019   Shellfish Allergy Nausea And Vomiting    Outpatient Encounter Medications as of 06/04/2021  Medication Sig Note   acetaminophen (TYLENOL) 500 MG tablet Take 1,500 mg by mouth 2 (two) times daily. 11/01/2019: Take 3 in the morning and 4 at night.   albuterol (VENTOLIN HFA) 108 (90 Base) MCG/ACT inhaler 1-2 inhalations every 4-6 hours as needed for wheezing. Dispense spacer as needed.    amLODipine (NORVASC) 5 MG tablet  Take 2 tablets (10 mg total) by mouth daily.    busPIRone (BUSPAR) 10 MG tablet TAKE 1 TABLET BY MOUTH THREE TIMES A DAY    Calcium Carb-Cholecalciferol 600-800 MG-UNIT TABS Take 1 tablet by mouth 2 (two) times a day.    [employer] MELATONIN 3 MG TABS TAKE 1 TABLET (3 MG TOTAL) BY MOUTH AT BEDTIME AS NEEDED (FOR SLEEP). (Patient not taking: No sig reported)    DULoxetine (CYMBALTA) 60 MG capsule TAKE 1 CAPSULE BY MOUTH EVERY DAY     ELIQUIS 5 MG TABS tablet TAKE 1 TABLET BY MOUTH TWICE A DAY    losartan (COZAAR) 100 MG tablet Take 100 mg by mouth daily.     metoprolol tartrate (LOPRESSOR) 25 MG tablet Take 0.5 tablets (12.5 mg total) by mouth 2 (two) times daily. 02/13/2021: Patient takes whole tablet in the morning   Multiple Vitamin (MULTIVITAMIN) tablet Take 1 tablet by mouth daily. Herbal Life multivitamin    Multiple Vitamins-Minerals (PRESERVISION AREDS 2 PO) Take 1 tablet by mouth daily.    omeprazole (PRILOSEC) 40 MG capsule Take 1 capsule (40 mg total) by mouth daily.    pregabalin (LYRICA) 75 MG capsule TAKE 1 CAPSULE BY MOUTH TWICE A DAY    PROLIA 60 MG/ML SOSY injection Inject 60 mg into the skin every 6 (six) months.  11/23/2019: Daughter recalls Dec/2020   QUEtiapine (SEROQUEL) 25 MG tablet TAKE 1 TABLET BY MOUTH EVERYDAY AT BEDTIME    rosuvastatin (CRESTOR) 20 MG tablet Take 20 mg by mouth daily.     traZODone (DESYREL) 100 MG tablet TAKE 1 TABLET BY MOUTH AT BEDTIME AS NEEDED FOR SLEEP.    Vitamin D, Ergocalciferol, (DRISDOL) 1.25 MG (50000 UNIT) CAPS capsule TAKE 1 CAPSULE BY MOUTH ONE TIME PER WEEK    zinc gluconate 50 MG tablet Take 50 mg by mouth daily. (Patient not taking: No sig reported)    No facility-administered encounter medications on file as of 06/04/2021.    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   Genetic testing 02/12/2021   Chronic obstructive pulmonary disease ([place]) 02/05/2021   Lumbar radiculopathy 02/05/2021   Atrial fibrillation ([place]) 01/09/2021   Vitamin D deficiency 01/09/2021   Seborrheic keratoses 01/09/2021   Urinary urgency 11/21/2020   Vertigo 06/12/2020   Vocal cord dysfunction 06/12/2020   Chronic respiratory failure ([place]) 06/12/2020   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 07/16/2019   Renal stones 03/15/2019    Insomnia 09/08/2016   Restless leg syndrome 05/24/2015   History of tobacco use 05/23/2014   Mitral valve regurgitation 05/23/2014   Dysfunctional uterine bleeding 09/02/2013   Breast cancer of lower-outer quadrant of right female breast ([place]) 07/24/2013   Postmenopausal bleeding 07/14/2013   Osteopenia 12/27/2011   Anxiety 07/19/2009   Osteoarthritis 07/19/2009   IRRITABLE BOWEL SYNDROME 11/17/2008   COLONIC POLYPS, ADENOMATOUS 11/17/2007   Hyperlipidemia 11/17/2007   Obesity, unspecified 11/17/2007   Depression 11/17/2007   Essential hypertension 11/17/2007   Coronary atherosclerosis 11/17/2007   HEMORRHOIDS, INTERNAL 11/17/2007   GERD 11/17/2007   PEPTIC STRICTURE 11/17/2007   OSA (obstructive sleep apnea) 11/17/2007   Diverticulosis of colon (without mention of hemorrhage) 09/05/2005   ESOPHAGEAL STRICTURE 06/01/2002    Conditions to be addressed/monitored:  Oxygen  Care Plan : RN Case Manager  Updates made by Lazaro Arms, RN since 06/05/2021 12:00  AM     Problem: Quality of Life COPD      Goal: Quality of Life Maintained by using portable Oxygen   Start Date: 02/13/2021  Expected End Date: 07/17/2021  Priority: High  Note:    Care Coordination needs related to needed DME/Supplies in a patient with COPD  Nurse Case Manager Clinical Goal(s):  Over the next 14 days, patient will verbalize understanding of plan to obtain needed DME.  Over the next 21 days, patient will work with care guide and/or [facility]  to address needs related to DME needs.  Interventions:  Inter-disciplinary care team collaboration (see longitudinal plan of care) Collaboration with/confirmation of receipt of DME orders at [facility]  Evaluation of current treatment plan related to primary care provider's note on 01/09/21 and patient's adherence to plan as established by provider. Reviewed medications with patient and discussed of taking medication- patient reports that she had thrown her Seroquel away.   She is going to call the pharmacy to see if she has a refill so that she can start to take the medication. Discussed plans with patient for ongoing care management follow up and provided patient with direct contact information for care management team Will send the patient COPD folder Explain to the patient that her information will be sent to [facility] for the portable oxygen and she will need to have a Fit test done for portable oxygen. Spoke with the patient and she reports that she has an appointment on June 20th for an evaluation.  The patient is looking for a light weight tank that she can carry to her appointments.  She does not want anything she will have to push. I advised her to talk face to face with the representatives so they can understand what she is looking for .  She stated that she would.  I will follow back up with the patient next week.  Patient Goals/Self-Care Activities: Avoid smoke and air pollution Exercise on a regular basis Keep your airway clear from mucus build up  Control your cough by drinking plenty of water Use a humidifier, if needed Visit your doctor on a regular basis Receive your annual flu vaccine       Plan: Telephone follow up appointment with care management team member scheduled for:  next 10 days  Lazaro Arms RN, BSN, [place] Management Coordinator [place] Phone: [phone] I Fax: [phone]

## 2021-06-14 ENCOUNTER — Ambulatory Visit: Payer: Medicare Other

## 2021-06-15 ENCOUNTER — Telehealth: Payer: Self-pay

## 2021-06-15 ENCOUNTER — Telehealth: Payer: Medicare Other

## 2021-06-15 NOTE — Telephone Encounter (Signed)
   RN Case Manager Care Management   Phone Outreach    06/15/2021 Name: Martha Mora MRN: [id number] DOB: Mar 15, 1946  Martha Mora is a 75 y.o. year old female who is a primary care patient of [name], DO .   Telephone outreach was unsuccessful A HIPPA compliant phone message was left for the patient providing contact information and requesting a return call.   Plan:Will route chart to Care Guide to see if patient would like to reschedule phone appointment   Review of patient status, including review of consultants reports, relevant laboratory and other test results, and collaboration with appropriate care team members and the patient's provider was performed as part of comprehensive patient evaluation and provision of care management services.    Lazaro Arms RN, BSN, [facility] Care Management Coordinator [place] Phone: [phone] I Fax: [phone]

## 2021-06-22 ENCOUNTER — Telehealth: Payer: Self-pay | Admitting: *Deleted

## 2021-06-22 NOTE — Chronic Care Management (AMB) (Signed)
  Care Management   Note  06/22/2021 Name: Martha Mora MRN: [id number] DOB: 03-18-1946  Martha Mora is a 75 y.o. year old female who is a primary care patient of [name], DO and is actively engaged with the care management team. I reached out to Martha Mora by phone today to assist with re-scheduling a follow up visit with the RN Case Manager  Follow up plan: Unsuccessful telephone outreach attempt made. A HIPAA compliant phone message was left for the patient providing contact information and requesting a return call.  The care management team will reach out to the patient again over the next 7-14 days.  If patient returns call to provider office, please advise to call [place] at [phone].  [place] Management  Direct Dial: [phone]

## 2021-06-27 DIAGNOSIS — H524 Presbyopia: Secondary | ICD-10-CM | POA: Diagnosis not present

## 2021-07-02 ENCOUNTER — Encounter (INDEPENDENT_AMBULATORY_CARE_PROVIDER_SITE_OTHER): Payer: Medicare Other | Admitting: Ophthalmology

## 2021-07-04 NOTE — Chronic Care Management (AMB) (Signed)
  Care Management   Note  07/04/2021 Name: Martha Mora MRN: [id number] DOB: 09-15-1946  Martha Mora is a 75 y.o. year old female who is a primary care patient of [name], DO and is actively engaged with the care management team. I reached out to [name] by phone today to assist with re-scheduling a follow up visit with the RN Case Manager  Follow up plan: The patient has been provided with contact information for the care management team and has been advised to call with any health related questions or concerns.  Patient is helping to take care of daughter and grandchildren and would like for a call to schedule a follow up with RNCM in 2 months   [place]: [phone]

## 2021-07-05 ENCOUNTER — Other Ambulatory Visit: Payer: Self-pay | Admitting: Family Medicine

## 2021-07-05 DIAGNOSIS — J9611 Chronic respiratory failure with hypoxia: Secondary | ICD-10-CM

## 2021-07-09 ENCOUNTER — Other Ambulatory Visit: Payer: Self-pay

## 2021-07-09 ENCOUNTER — Encounter (INDEPENDENT_AMBULATORY_CARE_PROVIDER_SITE_OTHER): Payer: Medicare Other | Admitting: Ophthalmology

## 2021-07-09 DIAGNOSIS — I10 Essential (primary) hypertension: Secondary | ICD-10-CM

## 2021-07-09 DIAGNOSIS — H33302 Unspecified retinal break, left eye: Secondary | ICD-10-CM | POA: Diagnosis not present

## 2021-07-09 DIAGNOSIS — H43813 Vitreous degeneration, bilateral: Secondary | ICD-10-CM

## 2021-07-09 DIAGNOSIS — H35033 Hypertensive retinopathy, bilateral: Secondary | ICD-10-CM | POA: Diagnosis not present

## 2021-07-09 DIAGNOSIS — H353231 Exudative age-related macular degeneration, bilateral, with active choroidal neovascularization: Secondary | ICD-10-CM

## 2021-07-10 DIAGNOSIS — M533 Sacrococcygeal disorders, not elsewhere classified: Secondary | ICD-10-CM | POA: Diagnosis not present

## 2021-07-25 ENCOUNTER — Encounter: Payer: Self-pay | Admitting: Family Medicine

## 2021-07-25 ENCOUNTER — Other Ambulatory Visit: Payer: Self-pay | Admitting: Nurse Practitioner

## 2021-07-25 ENCOUNTER — Other Ambulatory Visit: Payer: Self-pay | Admitting: Family Medicine

## 2021-07-25 DIAGNOSIS — M5431 Sciatica, right side: Secondary | ICD-10-CM

## 2021-07-25 DIAGNOSIS — F419 Anxiety disorder, unspecified: Secondary | ICD-10-CM

## 2021-08-02 ENCOUNTER — Ambulatory Visit: Payer: Medicare Other

## 2021-08-06 ENCOUNTER — Other Ambulatory Visit: Payer: Self-pay | Admitting: Family Medicine

## 2021-08-10 ENCOUNTER — Other Ambulatory Visit: Payer: Self-pay | Admitting: Family Medicine

## 2021-08-10 DIAGNOSIS — G4700 Insomnia, unspecified: Secondary | ICD-10-CM

## 2021-08-20 ENCOUNTER — Encounter (INDEPENDENT_AMBULATORY_CARE_PROVIDER_SITE_OTHER): Payer: Medicare Other | Admitting: Ophthalmology

## 2021-08-31 ENCOUNTER — Other Ambulatory Visit: Payer: Self-pay | Admitting: Family Medicine

## 2021-08-31 DIAGNOSIS — M5431 Sciatica, right side: Secondary | ICD-10-CM

## 2021-09-03 ENCOUNTER — Other Ambulatory Visit: Payer: Self-pay

## 2021-09-03 ENCOUNTER — Encounter (INDEPENDENT_AMBULATORY_CARE_PROVIDER_SITE_OTHER): Payer: Medicare Other | Admitting: Ophthalmology

## 2021-09-03 DIAGNOSIS — I10 Essential (primary) hypertension: Secondary | ICD-10-CM

## 2021-09-03 DIAGNOSIS — H35033 Hypertensive retinopathy, bilateral: Secondary | ICD-10-CM | POA: Diagnosis not present

## 2021-09-03 DIAGNOSIS — H43813 Vitreous degeneration, bilateral: Secondary | ICD-10-CM | POA: Diagnosis not present

## 2021-09-03 DIAGNOSIS — H33302 Unspecified retinal break, left eye: Secondary | ICD-10-CM

## 2021-09-03 DIAGNOSIS — H353231 Exudative age-related macular degeneration, bilateral, with active choroidal neovascularization: Secondary | ICD-10-CM

## 2021-09-05 DIAGNOSIS — I2510 Atherosclerotic heart disease of native coronary artery without angina pectoris: Secondary | ICD-10-CM | POA: Diagnosis not present

## 2021-09-05 DIAGNOSIS — R072 Precordial pain: Secondary | ICD-10-CM | POA: Diagnosis not present

## 2021-09-05 DIAGNOSIS — R0602 Shortness of breath: Secondary | ICD-10-CM | POA: Diagnosis not present

## 2021-09-05 DIAGNOSIS — Z9861 Coronary angioplasty status: Secondary | ICD-10-CM | POA: Diagnosis not present

## 2021-09-05 DIAGNOSIS — I10 Essential (primary) hypertension: Secondary | ICD-10-CM | POA: Diagnosis not present

## 2021-09-19 ENCOUNTER — Telehealth: Payer: Medicare Other

## 2021-09-19 ENCOUNTER — Telehealth: Payer: Self-pay

## 2021-09-19 NOTE — Telephone Encounter (Signed)
   RN Case Manager Care Management   Phone Outreach    09/19/2021 Name: Martha Mora MRN: [id number] DOB: 1945/12/10  Martha Mora is a 75 y.o. year old female who is a primary care patient of [name], DO .   Telephone outreach was unsuccessful A HIPPA compliant phone message was left for the patient providing contact information and requesting a return call.   Follow Up Plan: Will route chart to Care Guide to see if patient would like to reschedule phone appointment    Review of patient status, including review of consultants reports, relevant laboratory and other test results, and collaboration with appropriate care team members and the patient's provider was performed as part of comprehensive patient evaluation and provision of care management services.    Lazaro Arms RN, BSN, [facility] Care Management Coordinator [place] Phone: [phone] I Fax: [phone]

## 2021-09-21 ENCOUNTER — Telehealth: Payer: Self-pay | Admitting: *Deleted

## 2021-09-21 NOTE — Chronic Care Management (AMB) (Signed)
  Care Management   Note  09/21/2021 Name: [name] MRN: [id number] DOB: Mar 22, 1946  Martha Mora is a 75 y.o. year old female who is a primary care patient of [name], DO and is actively engaged with the care management team. I reached out to [name] by phone today to assist with re-scheduling a follow up visit with the RN Case Manager  Follow up plan: Unsuccessful telephone outreach attempt made. A HIPAA compliant phone message was left for the patient providing contact information and requesting a return call.  The care management team will reach out to the patient again over the next 7 days.  If patient returns call to provider office, please advise to call [place] at [phone].  [place] Management  Direct Dial: [phone]

## 2021-09-24 DIAGNOSIS — I2510 Atherosclerotic heart disease of native coronary artery without angina pectoris: Secondary | ICD-10-CM | POA: Diagnosis not present

## 2021-09-24 DIAGNOSIS — H353131 Nonexudative age-related macular degeneration, bilateral, early dry stage: Secondary | ICD-10-CM | POA: Diagnosis not present

## 2021-09-24 DIAGNOSIS — Z9861 Coronary angioplasty status: Secondary | ICD-10-CM | POA: Diagnosis not present

## 2021-09-24 DIAGNOSIS — R072 Precordial pain: Secondary | ICD-10-CM | POA: Diagnosis not present

## 2021-09-24 DIAGNOSIS — I340 Nonrheumatic mitral (valve) insufficiency: Secondary | ICD-10-CM | POA: Diagnosis not present

## 2021-09-28 NOTE — Chronic Care Management (AMB) (Signed)
  Care Management   Note  09/28/2021 Name: Martha Mora MRN: [id number] DOB: 1945/11/25  Martha Mora is a 75 y.o. year old female who is a primary care patient of [name], DO and is actively engaged with the care management team. I reached out to [name] by phone today to assist with re-scheduling a follow up visit with the RN Case Manager  Follow up plan: A second unsuccessful telephone outreach attempt made. A HIPAA compliant phone message was left for the patient providing contact information and requesting a return call. The care management team will reach out to the patient again over the next 7 days. If patient returns call to provider office, please advise to call [place] at [phone].  [place] Management  Direct Dial: [phone]

## 2021-10-04 NOTE — Chronic Care Management (AMB) (Signed)
  Care Management   Note  10/04/2021 Name: Martha Mora MRN: [id number] DOB: [birth year]  Martha Mora is a 75 y.o. year old female who is a primary care patient of [name], DO and is actively engaged with the care management team. I reached out to [name] by phone today to assist with re-scheduling a follow up visit with the RN Case Manager  Follow up plan: A third unsuccessful telephone outreach attempt made. A HIPAA compliant phone message was left for the patient providing contact information and requesting a return call. Unable to make contact on outreach attempts x 3. PCP [name], DO notified via routed documentation in medical record. We have been unable to make contact with the patient for follow up. The care management team is available to follow up with the patient after provider conversation with the patient regarding recommendation for care management engagement and subsequent re-referral to the care management team.   [place] Management  Direct Dial: [phone]

## 2021-10-05 DIAGNOSIS — M47816 Spondylosis without myelopathy or radiculopathy, lumbar region: Secondary | ICD-10-CM | POA: Diagnosis not present

## 2021-10-05 DIAGNOSIS — M25512 Pain in left shoulder: Secondary | ICD-10-CM | POA: Diagnosis not present

## 2021-10-15 ENCOUNTER — Other Ambulatory Visit: Payer: Self-pay | Admitting: Family Medicine

## 2021-10-15 ENCOUNTER — Encounter (INDEPENDENT_AMBULATORY_CARE_PROVIDER_SITE_OTHER): Payer: Medicare Other | Admitting: Ophthalmology

## 2021-10-15 DIAGNOSIS — J9611 Chronic respiratory failure with hypoxia: Secondary | ICD-10-CM

## 2021-10-19 ENCOUNTER — Ambulatory Visit: Payer: Medicare Other | Admitting: Hematology

## 2021-10-19 ENCOUNTER — Other Ambulatory Visit: Payer: Medicare Other

## 2021-10-19 ENCOUNTER — Ambulatory Visit: Payer: Medicare Other

## 2021-10-22 ENCOUNTER — Encounter (INDEPENDENT_AMBULATORY_CARE_PROVIDER_SITE_OTHER): Payer: Medicare Other | Admitting: Ophthalmology

## 2021-10-22 ENCOUNTER — Other Ambulatory Visit: Payer: Self-pay

## 2021-10-22 DIAGNOSIS — H353231 Exudative age-related macular degeneration, bilateral, with active choroidal neovascularization: Secondary | ICD-10-CM | POA: Diagnosis not present

## 2021-10-22 DIAGNOSIS — H35033 Hypertensive retinopathy, bilateral: Secondary | ICD-10-CM | POA: Diagnosis not present

## 2021-10-22 DIAGNOSIS — H33302 Unspecified retinal break, left eye: Secondary | ICD-10-CM

## 2021-10-22 DIAGNOSIS — H43813 Vitreous degeneration, bilateral: Secondary | ICD-10-CM

## 2021-10-22 DIAGNOSIS — I10 Essential (primary) hypertension: Secondary | ICD-10-CM | POA: Diagnosis not present

## 2021-10-26 ENCOUNTER — Inpatient Hospital Stay: Payer: Medicare Other | Attending: Hematology

## 2021-10-26 ENCOUNTER — Inpatient Hospital Stay: Payer: Medicare Other

## 2021-10-26 ENCOUNTER — Inpatient Hospital Stay (HOSPITAL_BASED_OUTPATIENT_CLINIC_OR_DEPARTMENT_OTHER): Payer: Medicare Other | Admitting: Hematology

## 2021-10-26 ENCOUNTER — Other Ambulatory Visit: Payer: Self-pay

## 2021-10-26 VITALS — BP 105/48 | HR 59 | Temp 99.1°F | Resp 18 | Ht 61.0 in | Wt 235.0 lb

## 2021-10-26 DIAGNOSIS — M8580 Other specified disorders of bone density and structure, unspecified site: Secondary | ICD-10-CM

## 2021-10-26 DIAGNOSIS — Z170 Estrogen receptor positive status [ER+]: Secondary | ICD-10-CM

## 2021-10-26 DIAGNOSIS — C50511 Malignant neoplasm of lower-outer quadrant of right female breast: Secondary | ICD-10-CM

## 2021-10-26 DIAGNOSIS — I10 Essential (primary) hypertension: Secondary | ICD-10-CM | POA: Diagnosis not present

## 2021-10-26 LAB — CBC WITH DIFFERENTIAL/PLATELET
Abs Immature Granulocytes: 0.02 10*3/uL (ref 0.00–0.07)
Basophils Absolute: 0.1 10*3/uL (ref 0.0–0.1)
Basophils Relative: 1 %
Eosinophils Absolute: 0.4 10*3/uL (ref 0.0–0.5)
Eosinophils Relative: 4 %
HCT: 45 % (ref 36.0–46.0)
Hemoglobin: 14.3 g/dL (ref 12.0–15.0)
Immature Granulocytes: 0 %
Lymphocytes Relative: 22 %
Lymphs Abs: 2.2 10*3/uL (ref 0.7–4.0)
MCH: 29.7 pg (ref 26.0–34.0)
MCHC: 31.8 g/dL (ref 30.0–36.0)
MCV: 93.6 fL (ref 80.0–100.0)
Monocytes Absolute: 0.9 10*3/uL (ref 0.1–1.0)
Monocytes Relative: 9 %
Neutro Abs: 6.5 10*3/uL (ref 1.7–7.7)
Neutrophils Relative %: 64 %
Platelets: 219 10*3/uL (ref 150–400)
RBC: 4.81 MIL/uL (ref 3.87–5.11)
RDW: 14.9 % (ref 11.5–15.5)
WBC: 10.1 10*3/uL (ref 4.0–10.5)
nRBC: 0 % (ref 0.0–0.2)

## 2021-10-26 LAB — COMPREHENSIVE METABOLIC PANEL
ALT: 25 U/L (ref 0–44)
AST: 29 U/L (ref 15–41)
Albumin: 3.4 g/dL — ABNORMAL LOW (ref 3.5–5.0)
Alkaline Phosphatase: 99 U/L (ref 38–126)
Anion gap: 11 (ref 5–15)
BUN: 16 mg/dL (ref 8–23)
CO2: 25 mmol/L (ref 22–32)
Calcium: 9 mg/dL (ref 8.9–10.3)
Chloride: 105 mmol/L (ref 98–111)
Creatinine, Ser: 0.96 mg/dL (ref 0.44–1.00)
GFR, Estimated: >60 mL/min (ref >60)
Glucose, Bld: 132 mg/dL — ABNORMAL HIGH (ref 70–99)
Potassium: 4.3 mmol/L (ref 3.5–5.1)
Sodium: 141 mmol/L (ref 135–145)
Total Bilirubin: 0.4 mg/dL (ref 0.3–1.2)
Total Protein: 6.4 g/dL — ABNORMAL LOW (ref 6.5–8.1)

## 2021-10-26 MED ORDER — DENOSUMAB 60 MG/ML ~~LOC~~ SOSY
60.0000 mg | PREFILLED_SYRINGE | Freq: Once | SUBCUTANEOUS | Status: AC
  Administered 2021-10-26: 60 mg via SUBCUTANEOUS
  Filled 2021-10-26: qty 1
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
## 2021-10-27 ENCOUNTER — Encounter: Payer: Self-pay | Admitting: Hematology

## 2021-10-29 ENCOUNTER — Telehealth: Payer: Self-pay | Admitting: Hematology

## 2021-10-29 NOTE — Telephone Encounter (Signed)
Left message with follow-up appointment per 12/9 los.

## 2021-11-06 ENCOUNTER — Other Ambulatory Visit: Payer: Self-pay | Admitting: Family Medicine

## 2021-11-06 DIAGNOSIS — G4700 Insomnia, unspecified: Secondary | ICD-10-CM

## 2021-11-26 ENCOUNTER — Ambulatory Visit: Payer: Medicare Other

## 2021-11-30 ENCOUNTER — Encounter (INDEPENDENT_AMBULATORY_CARE_PROVIDER_SITE_OTHER): Payer: Medicare Other | Admitting: Ophthalmology

## 2021-12-03 ENCOUNTER — Encounter (INDEPENDENT_AMBULATORY_CARE_PROVIDER_SITE_OTHER): Payer: Medicare Other | Admitting: Ophthalmology

## 2021-12-03 ENCOUNTER — Other Ambulatory Visit: Payer: Self-pay | Admitting: Family Medicine

## 2021-12-03 ENCOUNTER — Other Ambulatory Visit: Payer: Self-pay | Admitting: Nurse Practitioner

## 2021-12-03 ENCOUNTER — Other Ambulatory Visit: Payer: Self-pay

## 2021-12-03 DIAGNOSIS — H353231 Exudative age-related macular degeneration, bilateral, with active choroidal neovascularization: Secondary | ICD-10-CM

## 2021-12-03 DIAGNOSIS — H35033 Hypertensive retinopathy, bilateral: Secondary | ICD-10-CM

## 2021-12-03 DIAGNOSIS — M5431 Sciatica, right side: Secondary | ICD-10-CM

## 2021-12-03 DIAGNOSIS — H43813 Vitreous degeneration, bilateral: Secondary | ICD-10-CM | POA: Diagnosis not present

## 2021-12-03 DIAGNOSIS — I10 Essential (primary) hypertension: Secondary | ICD-10-CM | POA: Diagnosis not present

## 2021-12-06 ENCOUNTER — Encounter: Payer: Self-pay | Admitting: Family Medicine

## 2021-12-07 ENCOUNTER — Other Ambulatory Visit: Payer: Self-pay

## 2021-12-07 ENCOUNTER — Ambulatory Visit (INDEPENDENT_AMBULATORY_CARE_PROVIDER_SITE_OTHER): Payer: Medicare Other | Admitting: Family Medicine

## 2021-12-07 VITALS — BP 140/75 | HR 66 | Wt 230.0 lb

## 2021-12-07 DIAGNOSIS — F419 Anxiety disorder, unspecified: Secondary | ICD-10-CM | POA: Diagnosis not present

## 2021-12-07 DIAGNOSIS — B079 Viral wart, unspecified: Secondary | ICD-10-CM | POA: Diagnosis not present

## 2021-12-07 DIAGNOSIS — G2581 Restless legs syndrome: Secondary | ICD-10-CM | POA: Diagnosis not present

## 2021-12-07 DIAGNOSIS — L989 Disorder of the skin and subcutaneous tissue, unspecified: Secondary | ICD-10-CM | POA: Diagnosis not present

## 2021-12-07 DIAGNOSIS — R300 Dysuria: Secondary | ICD-10-CM

## 2021-12-07 LAB — POCT URINALYSIS DIP (CLINITEK)
Glucose, UA: NEGATIVE mg/dL
Ketones, POC UA: NEGATIVE mg/dL
Leukocytes, UA: NEGATIVE
Nitrite, UA: NEGATIVE
POC PROTEIN,UA: 30 — AB
Spec Grav, UA: 1.025 (ref 1.010–1.025)
Urobilinogen, UA: 1 E.U./dL
pH, UA: 5.5 (ref 5.0–8.0)

## 2021-12-07 NOTE — Assessment & Plan Note (Signed)
Restless legs relatively well controlled for several months but worsening the past several weeks.  She discontinued quetiapine on her own with resolution of her symptoms, so I do wonder if there is some component of akathisia related to antipsychotic.  Restless leg syndrome does predate initiation of antipsychotic however. - discontinue quetiapine for now

## 2021-12-07 NOTE — Assessment & Plan Note (Signed)
On duloxetine and buspirone.  Discontinue quetiapine as above.  Advised to follow-up if anxiety is worsening to discuss medication adjustment.

## 2021-12-07 NOTE — Patient Instructions (Addendum)
It was nice seeing you today!  I recommend treating symptoms with Azo. I will send your urine for culture and send you antibiotics if it shows a UTI.  Stop taking the quetiapine for now. If your anxiety is getting worse, schedule a follow-up visit.  Schedule a separate visit with myself or your PCP or with our dermatology clinic.  Stay well, Zola Button, MD [place] [phone]  --  Make sure to check out at the front desk before you leave today.  Please arrive at least 15 minutes prior to your scheduled appointments.  If you had blood work today, I will send you a MyChart message or a letter if results are normal. Otherwise, I will give you a call.  If you had a referral placed, they will call you to set up an appointment. Please give [place] a call if you don't hear back in the next 2 weeks.  If you need additional refills before your next appointment, please call your pharmacy first.

## 2021-12-07 NOTE — Assessment & Plan Note (Signed)
Symptoms of dysuria with malodorous urine.  UA not indicative of UTI.  She was previously evaluated for urinary symptoms in April 2022 during which urine culture was ultimately negative.  We will hold off on antibiotics for now. - urine culture, antibiotics if indicated - symptomatic care with Azo

## 2021-12-07 NOTE — Progress Notes (Signed)
SUBJECTIVE:   CHIEF COMPLAINT / HPI:  No chief complaint on file.   Restless legs She has a history of restless leg syndrome for which she takes pregabalin.  She has not had any issues with restless legs in several months but reports that she has had more sensation of restlessness in her legs over the past several weeks.  This only occurs at night and not during the day.  She wonders if the quetiapine she is taking is causing the symptoms.  She did stop taking this 2 days ago and states that her symptoms have resolved the past 2 nights.  She takes quetiapine for anxiety.  She denies prior diagnosis of bipolar disorder or schizophrenia.  Urinary symptoms Dysuria and malodorous urine for 1.5 weeks. Feels like she is having a UTI.  Denies new back pain, fever.  She does have some back pain at baseline.  Wart She reports she has a wart on her face which she would like removed.  She also states she has other skin lesions she would like examined.  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  PERTINENT  PMH / PSH: HTN, A. fib on anticoagulation, kidney stones, urinary incontinence, E. coli bacteremia requiring hospital admission 02/2019, SCC  Patient Care Team: [name], DO as PCP - General (Family Medicine) Monna Fam, MD as Consulting Physician (Ophthalmology) [name], MD as Consulting Physician (Cardiology) [name], MD as Consulting Physician (Gastroenterology) Normajean Glasgow, MD as Attending Physician (Physical Medicine and Rehabilitation) Lazaro Arms, RN as [place] Management   OBJECTIVE:   BP 140/75    Pulse 66    Wt 230 lb (104.3 kg)    SpO2 90%    BMI 43.46 kg/m   Physical Exam Constitutional:      General: She is not in acute distress. HENT:     Head: Normocephalic and atraumatic.  Cardiovascular:     Rate and Rhythm: Normal rate and regular rhythm.  Pulmonary:     Effort: Pulmonary effort is normal.  No respiratory distress.     Breath sounds: Normal breath sounds.  Musculoskeletal:     Cervical back: Neck supple.  Neurological:     Mental Status: She is alert.     Cranial Nerves: No cranial nerve deficit.     Motor: No weakness.     Coordination: Coordination normal.  Derm: verrucous lesion on left cheek      Depression screen [facility] 2/9 05/17/2021  Decreased Interest 1  Down, Depressed, Hopeless 1  PHQ - 2 Score 2  Altered sleeping 2  Tired, decreased energy 2  Change in appetite 0  Feeling bad or failure about yourself  1  Trouble concentrating 0  Moving slowly or fidgety/restless 0  Suicidal thoughts 0  PHQ-9 Score 7  Difficult doing work/chores Somewhat difficult  Some recent data might be hidden     {Show previous vital signs (optional):23777}    ASSESSMENT/PLAN:   Restless leg syndrome Restless legs relatively well controlled for several months but worsening the past several weeks.  She discontinued quetiapine on her own with resolution of her symptoms, so I do wonder if there is some component of akathisia related to antipsychotic.  Restless leg syndrome does predate initiation of antipsychotic however. - discontinue quetiapine for now  Anxiety On duloxetine and buspirone.  Discontinue quetiapine as above.  Advised to follow-up if anxiety is worsening to discuss medication adjustment.  Dysuria  Symptoms of dysuria with malodorous urine.  UA not indicative of UTI.  She was previously evaluated for urinary symptoms in April 2022 during which urine culture was ultimately negative.  We will hold off on antibiotics for now. - urine culture, antibiotics if indicated - symptomatic care with Azo   Advised to schedule appointment with dermatology clinic to discuss wart removal and other skin lesion concerns.  I believe the wart would be amenable to cryotherapy.    Return if symptoms worsen or fail to improve.   Zola Button, MD [place]

## 2021-12-10 LAB — URINE CULTURE

## 2021-12-18 ENCOUNTER — Encounter: Payer: Self-pay | Admitting: Family Medicine

## 2021-12-21 NOTE — Progress Notes (Deleted)
° ° °  SUBJECTIVE:   CHIEF COMPLAINT / HPI:  No chief complaint on file.   ***  PERTINENT  PMH / PSH: ***  Patient Care Team: [name], DO as PCP - General (Family Medicine) Monna Fam, MD as Consulting Physician (Ophthalmology) [name], MD as Consulting Physician (Cardiology) [name], MD as Consulting Physician (Gastroenterology) Normajean Glasgow, MD as Attending Physician (Physical Medicine and Rehabilitation) Lazaro Arms, RN as [place] Management   OBJECTIVE:   There were no vitals taken for this visit.  Physical Exam   Depression screen PHQ 2/9 05/17/2021  Decreased Interest 1  Down, Depressed, Hopeless 1  PHQ - 2 Score 2  Altered sleeping 2  Tired, decreased energy 2  Change in appetite 0  Feeling bad or failure about yourself  1  Trouble concentrating 0  Moving slowly or fidgety/restless 0  Suicidal thoughts 0  PHQ-9 Score 7  Difficult doing work/chores Somewhat difficult  Some recent data might be hidden     {Show previous vital signs (optional):23777}  {Labs   Heme   Chem   Endocrine   Serology   Results Review (optional):23779}  ASSESSMENT/PLAN:   No problem-specific Assessment & Plan notes found for this encounter.    No follow-ups on file.   Zola Button, MD [place]

## 2021-12-21 NOTE — Patient Instructions (Incomplete)
It was nice seeing you today!  Blood work today.  See me in 3 months or whenever is a good for you.  Stay well, Kasai Beltran, MD [facility] [phone]  --  Make sure to check out at the front desk before you leave today.  Please arrive at least 15 minutes prior to your scheduled appointments.  If you had blood work today, I will send you a MyChart message or a letter if results are normal. Otherwise, I will give you a call.  If you had a referral placed, they will call you to set up an appointment. Please give us a call if you don't hear back in the next 2 weeks.  If you need additional refills before your next appointment, please call your pharmacy first.  

## 2021-12-25 ENCOUNTER — Other Ambulatory Visit: Payer: Self-pay | Admitting: Family Medicine

## 2021-12-25 ENCOUNTER — Ambulatory Visit: Payer: Medicare Other | Admitting: Family Medicine

## 2021-12-30 ENCOUNTER — Inpatient Hospital Stay (HOSPITAL_COMMUNITY)
Admission: EM | Admit: 2021-12-30 | Discharge: 2022-01-02 | DRG: 871 | Disposition: A | Discharge status: Home Health | Payer: Medicare Other | Attending: Family Medicine | Admitting: Family Medicine

## 2021-12-30 ENCOUNTER — Encounter (HOSPITAL_COMMUNITY): Payer: Self-pay | Admitting: Emergency Medicine

## 2021-12-30 ENCOUNTER — Other Ambulatory Visit: Payer: Self-pay

## 2021-12-30 ENCOUNTER — Emergency Department (HOSPITAL_COMMUNITY): Payer: Medicare Other

## 2021-12-30 DIAGNOSIS — Z20822 Contact with and (suspected) exposure to covid-19: Secondary | ICD-10-CM | POA: Diagnosis present

## 2021-12-30 DIAGNOSIS — Z853 Personal history of malignant neoplasm of breast: Secondary | ICD-10-CM

## 2021-12-30 DIAGNOSIS — R000 Tachycardia, unspecified: Secondary | ICD-10-CM | POA: Diagnosis not present

## 2021-12-30 DIAGNOSIS — F419 Anxiety disorder, unspecified: Secondary | ICD-10-CM | POA: Diagnosis present

## 2021-12-30 DIAGNOSIS — I2510 Atherosclerotic heart disease of native coronary artery without angina pectoris: Secondary | ICD-10-CM | POA: Diagnosis present

## 2021-12-30 DIAGNOSIS — Z888 Allergy status to other drugs, medicaments and biological substances status: Secondary | ICD-10-CM

## 2021-12-30 DIAGNOSIS — R6520 Severe sepsis without septic shock: Secondary | ICD-10-CM | POA: Diagnosis not present

## 2021-12-30 DIAGNOSIS — J189 Pneumonia, unspecified organism: Secondary | ICD-10-CM | POA: Diagnosis not present

## 2021-12-30 DIAGNOSIS — G8929 Other chronic pain: Secondary | ICD-10-CM | POA: Diagnosis present

## 2021-12-30 DIAGNOSIS — J440 Chronic obstructive pulmonary disease with acute lower respiratory infection: Secondary | ICD-10-CM | POA: Diagnosis present

## 2021-12-30 DIAGNOSIS — Z79899 Other long term (current) drug therapy: Secondary | ICD-10-CM

## 2021-12-30 DIAGNOSIS — R011 Cardiac murmur, unspecified: Secondary | ICD-10-CM | POA: Diagnosis present

## 2021-12-30 DIAGNOSIS — M1990 Unspecified osteoarthritis, unspecified site: Secondary | ICD-10-CM | POA: Diagnosis present

## 2021-12-30 DIAGNOSIS — Z87442 Personal history of urinary calculi: Secondary | ICD-10-CM

## 2021-12-30 DIAGNOSIS — Z743 Need for continuous supervision: Secondary | ICD-10-CM | POA: Diagnosis not present

## 2021-12-30 DIAGNOSIS — I4891 Unspecified atrial fibrillation: Secondary | ICD-10-CM | POA: Diagnosis not present

## 2021-12-30 DIAGNOSIS — I340 Nonrheumatic mitral (valve) insufficiency: Secondary | ICD-10-CM | POA: Diagnosis not present

## 2021-12-30 DIAGNOSIS — R531 Weakness: Secondary | ICD-10-CM | POA: Diagnosis not present

## 2021-12-30 DIAGNOSIS — Z880 Allergy status to penicillin: Secondary | ICD-10-CM

## 2021-12-30 DIAGNOSIS — J9621 Acute and chronic respiratory failure with hypoxia: Secondary | ICD-10-CM | POA: Diagnosis present

## 2021-12-30 DIAGNOSIS — H3530 Unspecified macular degeneration: Secondary | ICD-10-CM | POA: Diagnosis not present

## 2021-12-30 DIAGNOSIS — J9811 Atelectasis: Secondary | ICD-10-CM | POA: Diagnosis not present

## 2021-12-30 DIAGNOSIS — Z86010 Personal history of colonic polyps: Secondary | ICD-10-CM

## 2021-12-30 DIAGNOSIS — G9340 Encephalopathy, unspecified: Secondary | ICD-10-CM | POA: Diagnosis not present

## 2021-12-30 DIAGNOSIS — F17290 Nicotine dependence, other tobacco product, uncomplicated: Secondary | ICD-10-CM | POA: Diagnosis present

## 2021-12-30 DIAGNOSIS — E559 Vitamin D deficiency, unspecified: Secondary | ICD-10-CM | POA: Diagnosis present

## 2021-12-30 DIAGNOSIS — B999 Unspecified infectious disease: Secondary | ICD-10-CM

## 2021-12-30 DIAGNOSIS — Z825 Family history of asthma and other chronic lower respiratory diseases: Secondary | ICD-10-CM

## 2021-12-30 DIAGNOSIS — R404 Transient alteration of awareness: Secondary | ICD-10-CM | POA: Diagnosis not present

## 2021-12-30 DIAGNOSIS — Z6841 Body Mass Index (BMI) 40.0 and over, adult: Secondary | ICD-10-CM | POA: Diagnosis not present

## 2021-12-30 DIAGNOSIS — E785 Hyperlipidemia, unspecified: Secondary | ICD-10-CM | POA: Diagnosis not present

## 2021-12-30 DIAGNOSIS — I110 Hypertensive heart disease with heart failure: Secondary | ICD-10-CM | POA: Diagnosis present

## 2021-12-30 DIAGNOSIS — E875 Hyperkalemia: Secondary | ICD-10-CM | POA: Diagnosis not present

## 2021-12-30 DIAGNOSIS — R6889 Other general symptoms and signs: Secondary | ICD-10-CM | POA: Diagnosis not present

## 2021-12-30 DIAGNOSIS — M810 Age-related osteoporosis without current pathological fracture: Secondary | ICD-10-CM | POA: Diagnosis present

## 2021-12-30 DIAGNOSIS — I252 Old myocardial infarction: Secondary | ICD-10-CM

## 2021-12-30 DIAGNOSIS — K589 Irritable bowel syndrome without diarrhea: Secondary | ICD-10-CM | POA: Diagnosis present

## 2021-12-30 DIAGNOSIS — G2581 Restless legs syndrome: Secondary | ICD-10-CM | POA: Diagnosis present

## 2021-12-30 DIAGNOSIS — G9341 Metabolic encephalopathy: Secondary | ICD-10-CM | POA: Diagnosis present

## 2021-12-30 DIAGNOSIS — Z881 Allergy status to other antibiotic agents status: Secondary | ICD-10-CM

## 2021-12-30 DIAGNOSIS — Z923 Personal history of irradiation: Secondary | ICD-10-CM

## 2021-12-30 DIAGNOSIS — R0600 Dyspnea, unspecified: Secondary | ICD-10-CM | POA: Diagnosis not present

## 2021-12-30 DIAGNOSIS — I5031 Acute diastolic (congestive) heart failure: Secondary | ICD-10-CM | POA: Diagnosis not present

## 2021-12-30 DIAGNOSIS — Z91013 Allergy to seafood: Secondary | ICD-10-CM

## 2021-12-30 DIAGNOSIS — A419 Sepsis, unspecified organism: Secondary | ICD-10-CM | POA: Diagnosis not present

## 2021-12-30 DIAGNOSIS — I499 Cardiac arrhythmia, unspecified: Secondary | ICD-10-CM | POA: Diagnosis not present

## 2021-12-30 DIAGNOSIS — Z87891 Personal history of nicotine dependence: Secondary | ICD-10-CM

## 2021-12-30 DIAGNOSIS — Z7901 Long term (current) use of anticoagulants: Secondary | ICD-10-CM

## 2021-12-30 DIAGNOSIS — J449 Chronic obstructive pulmonary disease, unspecified: Secondary | ICD-10-CM | POA: Diagnosis not present

## 2021-12-30 DIAGNOSIS — R0609 Other forms of dyspnea: Secondary | ICD-10-CM | POA: Diagnosis not present

## 2021-12-30 DIAGNOSIS — R1310 Dysphagia, unspecified: Secondary | ICD-10-CM | POA: Diagnosis present

## 2021-12-30 DIAGNOSIS — G4733 Obstructive sleep apnea (adult) (pediatric): Secondary | ICD-10-CM | POA: Diagnosis present

## 2021-12-30 DIAGNOSIS — R0902 Hypoxemia: Secondary | ICD-10-CM | POA: Diagnosis not present

## 2021-12-30 DIAGNOSIS — K219 Gastro-esophageal reflux disease without esophagitis: Secondary | ICD-10-CM | POA: Diagnosis not present

## 2021-12-30 DIAGNOSIS — Z8249 Family history of ischemic heart disease and other diseases of the circulatory system: Secondary | ICD-10-CM

## 2021-12-30 DIAGNOSIS — R918 Other nonspecific abnormal finding of lung field: Secondary | ICD-10-CM | POA: Diagnosis not present

## 2021-12-30 DIAGNOSIS — R0602 Shortness of breath: Secondary | ICD-10-CM | POA: Diagnosis present

## 2021-12-30 DIAGNOSIS — Z955 Presence of coronary angioplasty implant and graft: Secondary | ICD-10-CM

## 2021-12-30 DIAGNOSIS — J441 Chronic obstructive pulmonary disease with (acute) exacerbation: Secondary | ICD-10-CM | POA: Diagnosis not present

## 2021-12-30 LAB — I-STAT ARTERIAL BLOOD GAS, ED
Acid-Base Excess: 1 mmol/L (ref 0.0–2.0)
Bicarbonate: 27.3 mmol/L (ref 20.0–28.0)
Calcium, Ion: 1.22 mmol/L (ref 1.15–1.40)
HCT: 45 % (ref 36.0–46.0)
Hemoglobin: 15.3 g/dL — ABNORMAL HIGH (ref 12.0–15.0)
O2 Saturation: 98 %
Patient temperature: 98.6
Potassium: 3.8 mmol/L (ref 3.5–5.1)
Sodium: 139 mmol/L (ref 135–145)
TCO2: 29 mmol/L (ref 22–32)
pCO2 arterial: 49.3 mmHg — ABNORMAL HIGH (ref 32.0–48.0)
pH, Arterial: 7.352 (ref 7.350–7.450)
pO2, Arterial: 108 mmHg (ref 83.0–108.0)

## 2021-12-30 LAB — URINALYSIS, ROUTINE W REFLEX MICROSCOPIC
Bacteria, UA: NONE SEEN
Bilirubin Urine: NEGATIVE
Glucose, UA: NEGATIVE mg/dL
Hgb urine dipstick: NEGATIVE
Ketones, ur: NEGATIVE mg/dL
Leukocytes,Ua: NEGATIVE
Nitrite: NEGATIVE
Protein, ur: 100 mg/dL — AB
Specific Gravity, Urine: 1.023 (ref 1.005–1.030)
pH: 5 (ref 5.0–8.0)

## 2021-12-30 LAB — COMPREHENSIVE METABOLIC PANEL
ALT: 17 U/L (ref 0–44)
AST: 39 U/L (ref 15–41)
Albumin: 3.8 g/dL (ref 3.5–5.0)
Alkaline Phosphatase: 71 U/L (ref 38–126)
Anion gap: 14 (ref 5–15)
BUN: 12 mg/dL (ref 8–23)
CO2: 21 mmol/L — ABNORMAL LOW (ref 22–32)
Calcium: 9.1 mg/dL (ref 8.9–10.3)
Chloride: 103 mmol/L (ref 98–111)
Creatinine, Ser: 0.98 mg/dL (ref 0.44–1.00)
GFR, Estimated: 59 mL/min — ABNORMAL LOW (ref >60)
Glucose, Bld: 163 mg/dL — ABNORMAL HIGH (ref 70–99)
Potassium: 4.9 mmol/L (ref 3.5–5.1)
Sodium: 138 mmol/L (ref 135–145)
Total Bilirubin: 1.5 mg/dL — ABNORMAL HIGH (ref 0.3–1.2)
Total Protein: 6.7 g/dL (ref 6.5–8.1)

## 2021-12-30 LAB — LACTIC ACID, PLASMA
Lactic Acid, Venous: 2.7 mmol/L (ref 0.5–1.9)
Lactic Acid, Venous: 3.8 mmol/L (ref 0.5–1.9)

## 2021-12-30 LAB — CBC
HCT: 48.5 % — ABNORMAL HIGH (ref 36.0–46.0)
Hemoglobin: 15 g/dL (ref 12.0–15.0)
MCH: 30.7 pg (ref 26.0–34.0)
MCHC: 30.9 g/dL (ref 30.0–36.0)
MCV: 99.2 fL (ref 80.0–100.0)
Platelets: 252 10*3/uL (ref 150–400)
RBC: 4.89 MIL/uL (ref 3.87–5.11)
RDW: 15.4 % (ref 11.5–15.5)
WBC: 36.3 10*3/uL — ABNORMAL HIGH (ref 4.0–10.5)
nRBC: 0 % (ref 0.0–0.2)

## 2021-12-30 LAB — RESP PANEL BY RT-PCR (FLU A&B, COVID) ARPGX2
Influenza A by PCR: NEGATIVE
Influenza B by PCR: NEGATIVE
SARS Coronavirus 2 by RT PCR: NEGATIVE

## 2021-12-30 LAB — BRAIN NATRIURETIC PEPTIDE: B Natriuretic Peptide: 249.2 pg/mL — ABNORMAL HIGH (ref 0.0–100.0)

## 2021-12-30 MED ORDER — SODIUM CHLORIDE 0.9 % IV SOLN
1.0000 g | Freq: Once | INTRAVENOUS | Status: AC
  Administered 2021-12-30: 1 g via INTRAVENOUS
  Filled 2021-12-30: qty 10

## 2021-12-30 MED ORDER — VITAMIN D (ERGOCALCIFEROL) 1.25 MG (50000 UNIT) PO CAPS
50000.0000 [IU] | ORAL_CAPSULE | ORAL | Status: DC
  Administered 2021-12-31: 50000 [IU] via ORAL
  Filled 2021-12-30: qty 1

## 2021-12-30 MED ORDER — OYSTER SHELL CALCIUM/D3 500-5 MG-MCG PO TABS
1.0000 | ORAL_TABLET | Freq: Two times a day (BID) | ORAL | Status: DC
  Administered 2021-12-30 – 2022-01-02 (×7): 1 via ORAL
  Filled 2021-12-30 (×8): qty 1

## 2021-12-30 MED ORDER — BUSPIRONE HCL 10 MG PO TABS
10.0000 mg | ORAL_TABLET | Freq: Three times a day (TID) | ORAL | Status: DC
  Administered 2021-12-30 – 2022-01-02 (×9): 10 mg via ORAL
  Filled 2021-12-30 (×9): qty 1

## 2021-12-30 MED ORDER — ACETAMINOPHEN 650 MG RE SUPP
650.0000 mg | Freq: Once | RECTAL | Status: AC
  Administered 2021-12-30: 650 mg via RECTAL
  Filled 2021-12-30: qty 1

## 2021-12-30 MED ORDER — ADULT MULTIVITAMIN W/MINERALS CH
1.0000 | ORAL_TABLET | Freq: Every day | ORAL | Status: DC
  Administered 2021-12-30 – 2022-01-02 (×4): 1 via ORAL
  Filled 2021-12-30 (×4): qty 1

## 2021-12-30 MED ORDER — SODIUM CHLORIDE 0.9 % IV BOLUS (SEPSIS)
1000.0000 mL | Freq: Once | INTRAVENOUS | Status: AC
  Administered 2021-12-30: 1000 mL via INTRAVENOUS

## 2021-12-30 MED ORDER — CALCIUM CARB-CHOLECALCIFEROL 600-800 MG-UNIT PO TABS
1.0000 | ORAL_TABLET | Freq: Two times a day (BID) | ORAL | Status: DC
Start: 2021-12-30 — End: 2021-12-30

## 2021-12-30 MED ORDER — OCUVITE-LUTEIN PO CAPS
1.0000 | ORAL_CAPSULE | Freq: Every day | ORAL | Status: DC
  Filled 2021-12-30: qty 1

## 2021-12-30 MED ORDER — SODIUM CHLORIDE 0.9 % IV SOLN
1000.0000 mL | INTRAVENOUS | Status: DC
  Administered 2021-12-30: 1000 mL via INTRAVENOUS

## 2021-12-30 MED ORDER — ROSUVASTATIN CALCIUM 20 MG PO TABS
20.0000 mg | ORAL_TABLET | Freq: Every day | ORAL | Status: DC
  Administered 2021-12-30 – 2022-01-02 (×4): 20 mg via ORAL
  Filled 2021-12-30 (×4): qty 1

## 2021-12-30 MED ORDER — PRESERVISION AREDS 2 PO CAPS: 1.0000 | ORAL_CAPSULE | Freq: Every day | ORAL | Status: DC

## 2021-12-30 MED ORDER — AZITHROMYCIN 250 MG PO TABS: 500.0000 mg | ORAL_TABLET | Freq: Every day | ORAL | Status: DC

## 2021-12-30 MED ORDER — IPRATROPIUM BROMIDE 0.02 % IN SOLN
0.5000 mg | Freq: Once | RESPIRATORY_TRACT | Status: AC
Start: 2021-12-30 — End: 2021-12-30
  Administered 2021-12-30: 0.5 mg via RESPIRATORY_TRACT
  Filled 2021-12-30: qty 2.5

## 2021-12-30 MED ORDER — PROSIGHT PO TABS
1.0000 | ORAL_TABLET | Freq: Every day | ORAL | Status: DC
  Administered 2021-12-30 – 2022-01-02 (×4): 1 via ORAL
  Filled 2021-12-30 (×4): qty 1

## 2021-12-30 MED ORDER — SODIUM CHLORIDE 0.9 % IV SOLN
500.0000 mg | Freq: Once | INTRAVENOUS | Status: AC
  Administered 2021-12-30: 500 mg via INTRAVENOUS
  Filled 2021-12-30: qty 5

## 2021-12-30 MED ORDER — AMIODARONE HCL 200 MG PO TABS
100.0000 mg | ORAL_TABLET | Freq: Every day | ORAL | Status: DC
  Administered 2021-12-30 – 2022-01-02 (×4): 100 mg via ORAL
  Filled 2021-12-30 (×4): qty 1

## 2021-12-30 MED ORDER — PANTOPRAZOLE SODIUM 40 MG PO TBEC
40.0000 mg | DELAYED_RELEASE_TABLET | Freq: Every day | ORAL | Status: DC
  Administered 2021-12-30 – 2022-01-02 (×4): 40 mg via ORAL
  Filled 2021-12-30 (×4): qty 1

## 2021-12-30 MED ORDER — PREDNISONE 20 MG PO TABS
40.0000 mg | ORAL_TABLET | Freq: Every day | ORAL | Status: DC
  Administered 2021-12-31 – 2022-01-02 (×3): 40 mg via ORAL
  Filled 2021-12-30 (×3): qty 2

## 2021-12-30 MED ORDER — MAGNESIUM SULFATE 2 GM/50ML IV SOLN
2.0000 g | Freq: Once | INTRAVENOUS | Status: AC
  Administered 2021-12-30: 2 g via INTRAVENOUS
  Filled 2021-12-30: qty 50

## 2021-12-30 MED ORDER — APIXABAN 5 MG PO TABS
5.0000 mg | ORAL_TABLET | Freq: Two times a day (BID) | ORAL | Status: DC
  Administered 2021-12-30 – 2022-01-01 (×4): 5 mg via ORAL
  Filled 2021-12-30 (×4): qty 1

## 2021-12-30 MED ORDER — DULOXETINE HCL 60 MG PO CPEP
60.0000 mg | ORAL_CAPSULE | Freq: Every day | ORAL | Status: DC
  Administered 2021-12-30 – 2022-01-02 (×4): 60 mg via ORAL
  Filled 2021-12-30 (×4): qty 1

## 2021-12-30 MED ORDER — ALBUTEROL SULFATE (2.5 MG/3ML) 0.083% IN NEBU
10.0000 mg/h | INHALATION_SOLUTION | Freq: Once | RESPIRATORY_TRACT | Status: AC
  Administered 2021-12-30: 10 mg/h via RESPIRATORY_TRACT
  Filled 2021-12-30: qty 3

## 2021-12-30 MED ORDER — SODIUM CHLORIDE 0.9 % IV SOLN
2.0000 g | INTRAVENOUS | Status: DC
  Administered 2021-12-31 – 2022-01-01 (×2): 2 g via INTRAVENOUS
  Filled 2021-12-30 (×2): qty 20

## 2021-12-30 MED ORDER — ACETAMINOPHEN 650 MG RE SUPP: 650.0000 mg | Freq: Four times a day (QID) | RECTAL | Status: DC | PRN

## 2021-12-30 MED ORDER — PREDNISONE 20 MG PO TABS: 40.0000 mg | ORAL_TABLET | Freq: Every day | ORAL | Status: DC

## 2021-12-30 MED ORDER — NICOTINE 7 MG/24HR TD PT24
7.0000 mg | MEDICATED_PATCH | Freq: Every day | TRANSDERMAL | Status: DC
  Administered 2021-12-30 – 2022-01-02 (×4): 7 mg via TRANSDERMAL
  Filled 2021-12-30 (×5): qty 1

## 2021-12-30 MED ORDER — ACETAMINOPHEN 325 MG PO TABS
650.0000 mg | ORAL_TABLET | Freq: Four times a day (QID) | ORAL | Status: DC | PRN
  Administered 2022-01-02: 650 mg via ORAL
  Filled 2021-12-30: qty 2

## 2021-12-30 MED ORDER — PREGABALIN 75 MG PO CAPS
75.0000 mg | ORAL_CAPSULE | Freq: Two times a day (BID) | ORAL | Status: DC
  Administered 2021-12-30 – 2022-01-02 (×7): 75 mg via ORAL
  Filled 2021-12-30: qty 3
  Filled 2021-12-30 (×6): qty 1

## 2021-12-30 MED ORDER — AZITHROMYCIN 500 MG PO TABS
500.0000 mg | ORAL_TABLET | Freq: Every day | ORAL | Status: DC
  Administered 2021-12-31 – 2022-01-01 (×2): 500 mg via ORAL
  Filled 2021-12-30 (×2): qty 1

## 2021-12-30 MED ORDER — IPRATROPIUM-ALBUTEROL 0.5-2.5 (3) MG/3ML IN SOLN
3.0000 mL | Freq: Four times a day (QID) | RESPIRATORY_TRACT | Status: DC
  Administered 2021-12-30 – 2021-12-31 (×5): 3 mL via RESPIRATORY_TRACT
  Filled 2021-12-30 (×5): qty 3

## 2021-12-30 NOTE — ED Triage Notes (Signed)
Patient BIB [facility] from home, initial complaint of dizziness, syncope. Pt presented with shortness of breath. Pt received duoneb, 125 solumedrol from EMS. Attempted cpap pt became agitated. 5 midazolam given for agitation via EMS.
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
## 2021-12-30 NOTE — ED Notes (Signed)
Attempted report to 4E. RN unavailable at this time
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
## 2021-12-30 NOTE — Progress Notes (Signed)
Patient arrived to the floor via a wheelchair on 6l O2 alert and oriented and transferred to the recliner.  CHG bath given, cardiac monitoring initiated, VSS. Patient oriented to staff, room and equipment, we'll continue to monitor.

## 2021-12-30 NOTE — ED Provider Notes (Signed)
[facility] EMERGENCY DEPARTMENT Provider Note   CSN: [id number] Arrival date & time: 12/30/21  0802     History  Chief Complaint  Patient presents with   Shortness of Breath    Martha Mora is a 76 y.o. female.   Shortness of Breath  Patient has a history of coronary artery disease, breast cancer, sepsis, delirium, A-fib COPD who presents with shortness of breath.  History provided by EMS as patient is not able to answer any questions.  Family is not here yet to provide additional information.  Family indicated patient had an episode of lightheadedness and fainting spell.  She was notably short of breath.  EMS provided her with DuoNeb Solu-Medrol.  Patient became more agitated and they gave her 5 mg of midazolam.  Patient is able to tell me her name but not provide any other information at this time  Home Medications Prior to Admission medications   Medication Sig Start Date End Date Taking? Authorizing Provider  acetaminophen (TYLENOL) 500 MG tablet Take 1,500 mg by mouth 2 (two) times daily.    [provider]  albuterol (VENTOLIN HFA) 108 (90 Base) MCG/ACT inhaler TAKE 1-2 INHALATIONS EVERY 4-6 HOURS AS NEEDED FOR WHEEZING. DISPENSE SPACER AS NEEDED. Patient taking differently: Inhale 1-2 puffs into the lungs See admin instructions. Every 4 to 6 hours as needed for wheezing. 10/17/21   [name], DO  amiodarone (PACERONE) 200 MG tablet Take 1 tablet (200 mg total) by mouth daily. 01/09/21 02/23/22  [name], DO  amLODipine (NORVASC) 5 MG tablet Take 2 tablets (10 mg total) by mouth daily. 11/28/19   Brimage, Ronnette Juniper, DO  busPIRone (BUSPAR) 10 MG tablet TAKE 1 TABLET BY MOUTH THREE TIMES A DAY Patient taking differently: Take 10 mg by mouth 3 (three) times daily. 12/26/21   [name], DO  Calcium Carb-Cholecalciferol 600-800 MG-UNIT TABS Take 1 tablet by mouth 2 (two) times a day.    [provider]  [facility] MELATONIN 3 MG TABS  TAKE 1 TABLET (3 MG TOTAL) BY MOUTH AT BEDTIME AS NEEDED (FOR SLEEP). Patient taking differently: Take 3 mg by mouth at bedtime as needed (sleep). 12/15/19   Myles Gip, DO  DULoxetine (CYMBALTA) 60 MG capsule TAKE 1 CAPSULE BY MOUTH EVERY DAY Patient taking differently: Take 60 mg by mouth. 07/25/21   Brimage, Vondra, DO  ELIQUIS 5 MG TABS tablet TAKE 1 TABLET BY MOUTH TWICE A DAY Patient taking differently: Take 5 mg by mouth 2 (two) times daily. 03/31/19   Myles Gip, DO  losartan (COZAAR) 100 MG tablet Take 100 mg by mouth daily.  11/09/14   [provider]  metoprolol tartrate (LOPRESSOR) 25 MG tablet Take 0.5 tablets (12.5 mg total) by mouth 2 (two) times daily. 12/02/20 02/23/22  [name], DO  Multiple Vitamin (MULTIVITAMIN) tablet Take 1 tablet by mouth daily. Herbal Life multivitamin    [provider]  Multiple Vitamins-Minerals (PRESERVISION AREDS 2 PO) Take 1 tablet by mouth daily.    [provider]  omeprazole (PRILOSEC) 40 MG capsule TAKE 1 CAPSULE BY MOUTH  DAILY Patient taking differently: Take 40 mg by mouth daily. 08/06/21   Brimage, Ronnette Juniper, DO  pregabalin (LYRICA) 75 MG capsule TAKE 1 CAPSULE BY MOUTH TWICE A DAY Patient taking differently: Take 75 mg by mouth 2 (two) times daily. 12/05/21   Brimage, Ronnette Juniper, DO  PROLIA 60 MG/ML SOSY injection Inject 60 mg into the skin every 6 (six) months.  08/18/18   [provider]  QUEtiapine (SEROQUEL) 25 MG tablet Take 25 mg by mouth at bedtime.    [provider]  rosuvastatin (CRESTOR) 20 MG tablet Take 20 mg by mouth daily.  03/15/19   [provider]  traZODone (DESYREL) 100 MG tablet TAKE 1 TABLET BY MOUTH AT BEDTIME AS NEEDED FOR SLEEP Patient taking differently: Take 100 mg by mouth at bedtime as needed for sleep. 11/07/21   [name], DO  Vitamin D, Ergocalciferol, (DRISDOL) 1.25 MG (50000 UNIT) CAPS capsule TAKE 1 CAPSULE BY MOUTH ONE TIME PER WEEK Patient  taking differently: Take 50,000 Units by mouth once a week. 12/03/21   Truitt Merle, MD      Allergies    Keflex [cephalexin], Penicillins, Ropinirole hcl, and Shellfish allergy    Review of Systems   Review of Systems  Respiratory:  Positive for shortness of breath.    Physical Exam Updated Vital Signs BP (!) 119/51    Pulse 96    Temp (!) 101.3 F (38.5 C) (Axillary)    Resp (!) 22    SpO2 98%  Physical Exam Vitals and nursing note reviewed.  Constitutional:      General: She is in acute distress.     Appearance: She is well-developed. She is ill-appearing.  HENT:     Head: Normocephalic and atraumatic.     Right Ear: External ear normal.     Left Ear: External ear normal.  Eyes:     General: No scleral icterus.       Right eye: No discharge.        Left eye: No discharge.     Conjunctiva/sclera: Conjunctivae normal.  Neck:     Trachea: No tracheal deviation.  Cardiovascular:     Rate and Rhythm: Normal rate and regular rhythm.  Pulmonary:     Effort: Tachypnea and accessory muscle usage present. No respiratory distress.     Breath sounds: No stridor. Wheezing present. No rales.  Abdominal:     General: Bowel sounds are normal. There is no distension.     Palpations: Abdomen is soft.     Tenderness: There is no abdominal tenderness. There is no guarding or rebound.  Musculoskeletal:        General: No tenderness or deformity.     Cervical back: Neck supple.     Right lower leg: No edema.     Left lower leg: No edema.  Skin:    General: Skin is warm and dry.     Findings: No rash.  Neurological:     General: No focal deficit present.     Mental Status: She is disoriented.     Cranial Nerves: No cranial nerve deficit (no facial droop, , no slurred speech).     Sensory: No sensory deficit.     Motor: No abnormal muscle tone or seizure activity.     Coordination: Coordination normal.     Comments: Patient will answer with her name, not consistently following commands,  does not answer any further questions  Psychiatric:        Mood and Affect: Mood normal.    ED Results / Procedures / Treatments   Labs (all labs ordered are listed, but only abnormal results are displayed) Labs Reviewed  COMPREHENSIVE METABOLIC PANEL - Abnormal; Notable for the following components:      Result Value   CO2 21 (*)    Glucose, Bld 163 (*)    Total Bilirubin 1.5 (*)  GFR, Estimated 59 (*)    All other components within normal limits  CBC - Abnormal; Notable for the following components:   WBC 36.3 (*)    HCT 48.5 (*)    All other components within normal limits  BRAIN NATRIURETIC PEPTIDE - Abnormal; Notable for the following components:   B Natriuretic Peptide 249.2 (*)    All other components within normal limits  URINALYSIS, ROUTINE W REFLEX MICROSCOPIC - Abnormal; Notable for the following components:   APPearance HAZY (*)    Protein, ur 100 (*)    All other components within normal limits  LACTIC ACID, PLASMA - Abnormal; Notable for the following components:   Lactic Acid, Venous 2.7 (*)    All other components within normal limits  I-STAT ARTERIAL BLOOD GAS, ED - Abnormal; Notable for the following components:   pCO2 arterial 49.3 (*)    Hemoglobin 15.3 (*)    All other components within normal limits  RESP PANEL BY RT-PCR (FLU A&B, COVID) ARPGX2  CULTURE, BLOOD (ROUTINE X 2)  CULTURE, BLOOD (ROUTINE X 2)  LACTIC ACID, PLASMA    EKG EKG Interpretation  Date/Time:  Sunday December 30 2021 08:06:50 EST Ventricular Rate:  122 PR Interval:    QRS Duration: 94 QT Interval:  316 QTC Calculation: 451 R Axis:   3 Text Interpretation: undetermined rhythm, poor data quality probable sinus tachycardia Artifact in lead(s) I III Confirmed by Dorie Rank [phone]) on 12/30/2021 8:12:58 AM  Radiology DG Chest Port 1 View  Result Date: 12/30/2021 CLINICAL DATA:  Dyspnea EXAM: PORTABLE CHEST 1 VIEW COMPARISON:  11/25/2019 FINDINGS: Pulmonary opacity at the  bases, fairly hazy except for a asymmetrically focal opacity in the peripherally of the right lung. Cardiopericardial enlargement likely accentuated by mediastinal fat. Pleural fluid is not excluded. No pneumothorax. IMPRESSION: Low volume chest with infiltrate at the bases, more dense on the right. The asymmetry favors pneumonia. Electronically Signed   By: Jorje Guild M.D.   On: 12/30/2021 08:51    Procedures .Critical Care Performed by: Dorie Rank, MD Authorized by: Dorie Rank, MD   Critical care provider statement:    Critical care time (minutes):  40   Critical care was time spent personally by me on the following activities:  Development of treatment plan with patient or surrogate, discussions with consultants, evaluation of patient's response to treatment, examination of patient, ordering and review of laboratory studies, ordering and review of radiographic studies, ordering and performing treatments and interventions, pulse oximetry, re-evaluation of patient's condition and review of old charts    Medications Ordered in ED Medications  sodium chloride 0.9 % bolus 1,000 mL (0 mLs Intravenous Stopped 12/30/21 0938)    Followed by  0.9 %  sodium chloride infusion (has no administration in time range)  albuterol (PROVENTIL) (2.5 MG/3ML) 0.083% nebulizer solution (10 mg/hr Nebulization Given 12/30/21 0841)  ipratropium (ATROVENT) nebulizer solution 0.5 mg (0.5 mg Nebulization Given 12/30/21 0841)  magnesium sulfate IVPB 2 g 50 mL (0 g Intravenous Stopped 12/30/21 0938)  acetaminophen (TYLENOL) suppository 650 mg (650 mg Rectal Given 12/30/21 0837)  cefTRIAXone (ROCEPHIN) 1 g in sodium chloride 0.9 % 100 mL IVPB (0 g Intravenous Stopped 12/30/21 0938)  azithromycin (ZITHROMAX) 500 mg in sodium chloride 0.9 % 250 mL IVPB (0 mg Intravenous Stopped 12/30/21 0956)    ED Course/ Medical Decision Making/ A&P Clinical Course as of 12/31/21 0659  Sun Dec 30, 2021  0815 Patient clearly altered.   Initial vital signs normal  oxygen level.  She does have a fever.  Presentation concerning for possible evolving sepsis.  Hypercapnic respiratory failure is also a concern.  We will start with BiPAP, obtain an ABG, continuous albuterol treatment and magnesium [JK]  0832 Will initiate abx with the elevated wbc.  Reviewed allergy, tolerates rocephin [JK]  0848 DG Chest [facility] Formal radiology report pending but on my review patient appears to have right-sided infiltrate and possible effusion [JK]  0953 Mental status improving slightly.  Patient does wake up when shaking.  She speaks to me and says she is breathing a little better [JK]  1005 Urinalysis, Routine w reflex microscopic Urine, Catheterized(!) Urinalysis without signs of infection [JK]  1005 Blood culture (routine x 2) COVID and flu negative [JK]  1023 Patient continues to improve.  She would like to try to stop the BiPAP.  We will transition to oxygen and monitor closely [JK]  1026 Patient is breathing comfortably off of BiPAP.  Mental status has completely returned to baseline [JK]    Clinical Course User Index [JK] Dorie Rank, MD                           Medical Decision Making Amount and/or Complexity of Data Reviewed Labs: ordered. Decision-making details documented in ED Course. Radiology: ordered. Decision-making details documented in ED Course.  Risk OTC drugs. Prescription drug management. Decision regarding hospitalization.  Dyspnea Patient presented in respiratory distress with increased work of breathing wheezing on exam.  I felt the patient may end up requiring intubation but we started on BiPAP.  Fortunately she responded well to that intervention.  She was also given an hour-long nebulizer treatment.  Patient was given magnesium and had been given steroids by EMS.  Her breathing has improved.  She is oxygenating in the 90s.  ABG does not show any signs of severe hypoxia or hypercapnic respiratory failure.  We  will continue on supportive BiPAP.  Fever Patient also noted to have some confusion on arrival.  Concerned about the possibility of meningitis encephalitis urinary tract infection pneumonia.  Chest x-ray does suggest pneumonia.  Her mental status has improved to this her fever is decreased.  I suspect her symptoms are related to the pneumonia.  Patient started on IV antibiotics.  No signs of hypotension but she does have elevated lactic acid level.  We will need to monitor closely for evolving sepsis  Altered mental status. Appears to be improving and I think was related to her hypoxia and difficulty breathing as well as fever.  We will continue to monitor.  With the severity of her illness patient will require admission to the hospital for IV antibiotic, respiratory support and further close monitoring.  Case discussed with FM service, Pt seen by Dr Jinny Sanders for admission.        Final Clinical Impression(s) / ED Diagnoses Final diagnoses:  Community acquired pneumonia, unspecified laterality  COPD exacerbation ([place])  Acute metabolic encephalopathy     Dorie Rank, MD 12/31/21 [phone]
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
## 2021-12-31 ENCOUNTER — Other Ambulatory Visit (HOSPITAL_COMMUNITY): Payer: Self-pay

## 2021-12-31 ENCOUNTER — Encounter: Payer: Self-pay | Admitting: Hematology

## 2021-12-31 ENCOUNTER — Inpatient Hospital Stay (HOSPITAL_COMMUNITY): Payer: Medicare Other

## 2021-12-31 DIAGNOSIS — R0609 Other forms of dyspnea: Secondary | ICD-10-CM

## 2021-12-31 LAB — LIPID PANEL
Cholesterol: 146 mg/dL (ref 0–200)
HDL: 58 mg/dL (ref >40)
LDL Cholesterol: 70 mg/dL (ref 0–99)
Total CHOL/HDL Ratio: 2.5 RATIO
Triglycerides: 88 mg/dL (ref <150)
VLDL: 18 mg/dL (ref 0–40)

## 2021-12-31 LAB — BASIC METABOLIC PANEL
Anion gap: 9 (ref 5–15)
BUN: 30 mg/dL — ABNORMAL HIGH (ref 8–23)
CO2: 26 mmol/L (ref 22–32)
Calcium: 9.3 mg/dL (ref 8.9–10.3)
Chloride: 104 mmol/L (ref 98–111)
Creatinine, Ser: 1.06 mg/dL — ABNORMAL HIGH (ref 0.44–1.00)
GFR, Estimated: 54 mL/min — ABNORMAL LOW (ref >60)
Glucose, Bld: 140 mg/dL — ABNORMAL HIGH (ref 70–99)
Potassium: 4.5 mmol/L (ref 3.5–5.1)
Sodium: 139 mmol/L (ref 135–145)

## 2021-12-31 LAB — ECHOCARDIOGRAM COMPLETE
AR max vel: 2.15 cm2
AV Area VTI: 2.25 cm2
AV Area mean vel: 2.2 cm2
AV Mean grad: 9.7 mmHg
AV Peak grad: 15.8 mmHg
Ao pk vel: 1.99 m/s
Area-P 1/2: 2.45 cm2
Calc EF: 61.2 %
Height: 61 in
S' Lateral: 2.9 cm
Single Plane A2C EF: 58.5 %
Single Plane A4C EF: 62.9 %
Weight: 3798.4 oz

## 2021-12-31 LAB — CBC
HCT: 42.3 % (ref 36.0–46.0)
Hemoglobin: 13.7 g/dL (ref 12.0–15.0)
MCH: 31.1 pg (ref 26.0–34.0)
MCHC: 32.4 g/dL (ref 30.0–36.0)
MCV: 95.9 fL (ref 80.0–100.0)
Platelets: 197 10*3/uL (ref 150–400)
RBC: 4.41 MIL/uL (ref 3.87–5.11)
RDW: 16 % — ABNORMAL HIGH (ref 11.5–15.5)
WBC: 39.6 10*3/uL — ABNORMAL HIGH (ref 4.0–10.5)
nRBC: 0 % (ref 0.0–0.2)

## 2021-12-31 LAB — LACTIC ACID, PLASMA: Lactic Acid, Venous: 1.7 mmol/L (ref 0.5–1.9)

## 2021-12-31 LAB — URINE CULTURE: Culture: NO GROWTH

## 2021-12-31 LAB — MAGNESIUM: Magnesium: 2.3 mg/dL (ref 1.7–2.4)

## 2021-12-31 MED ORDER — FUROSEMIDE 10 MG/ML IJ SOLN
40.0000 mg | Freq: Once | INTRAMUSCULAR | Status: AC
  Administered 2021-12-31: 40 mg via INTRAVENOUS
  Filled 2021-12-31: qty 4

## 2021-12-31 MED ORDER — IPRATROPIUM-ALBUTEROL 0.5-2.5 (3) MG/3ML IN SOLN
3.0000 mL | Freq: Two times a day (BID) | RESPIRATORY_TRACT | Status: DC
  Administered 2021-12-31 – 2022-01-02 (×4): 3 mL via RESPIRATORY_TRACT
  Filled 2021-12-31 (×4): qty 3

## 2021-12-31 NOTE — Progress Notes (Signed)
PT HAS REFUSED BIPAP OR CPAP AT THIS TIME.  Rt REMOVED V60 BIPAP FROM ROOM.  RtTWILL CONT TO MONITOR.

## 2021-12-31 NOTE — Evaluation (Signed)
Physical Therapy Evaluation Patient Details Name: Martha Mora MRN: [id number] DOB: 16-May-1946 Today's Date: 12/31/2021  History of Present Illness  Patient is a 76 y/o female who presents on 12/30/21 with dizziness, syncope, SOB. Admitted with sepsis and acute on chronic hypoxemic respiratory failure and AMS likely secondary to PNA. PMH includes CAD, A-fib, COPD, HTN, breast ca in remission, anxiety, MI, osteoporosis  Clinical Impression  Patient presents with dyspnea on exertion, decreased activity tolerance and impaired cardiovascular endurance s/p above. Pt currently lives with her daughter and son in law but plans to move to a cottage beside her son's home in the next 2 weeks. Pt uses RW for ambulation, drives, and is independent for ADls at baseline. Pt has not been wearing her 02, "not sure why I needed it." Sp02 dropped to mid80s on 6L/min 02 [place]. Noted to have 2-3/4 DOE with ambulation. Discussed importance/need for supplemental 02, activity modifications and energy conservation techniques. Pt will need to negotiate 1 flight of stairs to enter home. Encouraged walking with mobility tech daily. Will follow acutely to maximize independence and mobility prior to return home.       Recommendations for follow up therapy are one component of a multi-disciplinary discharge planning process, led by the attending physician.  Recommendations may be updated based on patient status, additional functional criteria and insurance authorization.  Follow Up Recommendations No PT follow up    Assistance Recommended at Discharge Intermittent Supervision/Assistance  Patient can return home with the following  Assistance with cooking/housework;Help with stairs or ramp for entrance;A little help with walking and/or transfers;Direct supervision/assist for financial management    Equipment Recommendations None recommended by PT  Recommendations for Other Services       Functional Status Assessment  Patient has had a recent decline in their functional status and demonstrates the ability to make significant improvements in function in a reasonable and predictable amount of time.     Precautions / Restrictions Precautions Precautions: Other (comment);Fall Precaution Comments: watch 02 Restrictions Weight Bearing Restrictions: No      Mobility  Bed Mobility               General bed mobility comments: Up in chair upon PT arrival.    Transfers Overall transfer level: Needs assistance Equipment used: Rolling walker (2 wheels) Transfers: Sit to/from Stand Sit to Stand: Supervision           General transfer comment: Supervision for safety. Stood from Youth worker. Slightly impulsive.    Ambulation/Gait Ambulation/Gait assistance: Min guard Gait Distance (Feet): 100 Feet Assistive device: Rolling walker (2 wheels) Gait Pattern/deviations: Step-through pattern, Decreased stride length, Trunk flexed   Gait velocity interpretation: 1.31 - 2.62 ft/sec, indicative of limited community ambulator   General Gait Details: Mostly steady gait with RW; 2-3/4 DOE. Pt on 6L/min 02 [place] and Sp02 dropped to mid 80s with activity. Cues for pursed lip breathing.  Stairs            Wheelchair Mobility    Modified Rankin (Stroke Patients Only)       Balance Overall balance assessment: Needs assistance Sitting-balance support: Feet supported, No upper extremity supported Sitting balance-Leahy Scale: Good     Standing balance support: During functional activity Standing balance-Leahy Scale: Poor Standing balance comment: Requires UE support for dynamic tasks                             Pertinent Vitals/Pain Pain  Assessment Pain Assessment: No/denies pain    Home Living Family/patient expects to be discharged to:: Private residence Living Arrangements: Children;Alone (with daughter and son in law for the next 2 weeks than moving into a cottage next to her  son's house) Available Help at Discharge: Family;Available 24 hours/day Type of Home: House Home Access: Stairs to enter Entrance Stairs-Rails: Right Entrance Stairs-Number of Steps: 1 flight   Home Layout: One level Home Equipment: Conservation officer, nature (2 wheels);BSC/3in1;Rollator (4 wheels);Other (comment);Cane - single point;Shower seat Additional Comments: has a motorized scooter, does not use at baseline    Prior Function Prior Level of Function : Independent/Modified Independent             Mobility Comments: USes RW for ambulation, rollator for community, drives. ADLs Comments: independent     Hand Dominance   Dominant Hand: Right    Extremity/Trunk Assessment   Upper Extremity Assessment Upper Extremity Assessment: Defer to OT evaluation    Lower Extremity Assessment Lower Extremity Assessment: Overall WFL for tasks assessed    Cervical / Trunk Assessment Cervical / Trunk Assessment: Normal  Communication   Communication: No difficulties  Cognition Arousal/Alertness: Awake/alert Behavior During Therapy: WFL for tasks assessed/performed Overall Cognitive Status: No family/caregiver present to determine baseline cognitive functioning                                 General Comments: Pt not wearing 02 at home, "not sure why I needed it" despite being hypoxic and having severe AMS.        General Comments General comments (skin integrity, edema, etc.): Sp02 ranging from mid 80s-90s on 6L/min 02 [place].    Exercises     Assessment/Plan    PT Assessment Patient needs continued PT services  PT Problem List Decreased mobility;Decreased safety awareness;Cardiopulmonary status limiting activity;Decreased activity tolerance       PT Treatment Interventions Therapeutic exercise;Gait training;Stair training;Balance training;Therapeutic activities;Patient/family education;Functional mobility training    PT Goals (Current goals can be found in the Care  Plan section)  Acute Rehab PT Goals Patient Stated Goal: to go home PT Goal Formulation: With patient Time For Goal Achievement: 01/14/22 Potential to Achieve Goals: Good    Frequency Min 3X/week     Co-evaluation               AM-PAC PT "6 Clicks" Mobility  Outcome Measure Help needed turning from your back to your side while in a flat bed without using bedrails?: None Help needed moving from lying on your back to sitting on the side of a flat bed without using bedrails?: A Little Help needed moving to and from a bed to a chair (including a wheelchair)?: A Little Help needed standing up from a chair using your arms (e.g., wheelchair or bedside chair)?: A Little Help needed to walk in hospital room?: A Little Help needed climbing 3-5 steps with a railing? : A Little 6 Click Score: 19    End of Session Equipment Utilized During Treatment: Oxygen;Gait belt Activity Tolerance: Treatment limited secondary to medical complications (Comment);Patient limited by fatigue (drop in SP02) Patient left: in chair;with call bell/phone within reach Nurse Communication: Mobility status;Other (comment) (02 sats) PT Visit Diagnosis: Other abnormalities of gait and mobility (R26.89);Other (comment) (DOE)    Time: 1829-9371 PT Time Calculation (min) (ACUTE ONLY): 17 min   Charges:   PT Evaluation $PT Eval Moderate Complexity: 1 Mod  Zettie Cooley, DPT Acute Rehabilitation Services Pager [phone] Office [phone]     Lacie Draft 12/31/2021, 12:09 PM

## 2021-12-31 NOTE — Progress Notes (Addendum)
Family Medicine Teaching Service Daily Progress Note Intern Pager: [phone]  Patient name: Martha Mora Medical record number: [id number] Date of birth: 01-Sep-1946 Age: 76 y.o. Gender: female  Primary Care Provider: [name], DO Consultants: None Code Status: Full  Pt Overview and Major Events to Date:  1/12 admitted  Assessment and Plan:  Solash Tullo is a 76 y.o. female presenting with respiratory distress. PMH is significant for history of breast cancer, anxiety, hypertension, CAD s/p 3 stents in 2005, esophageal stricture, GERD, diverticulosis, IBS, OA, OSA, MVR, RLS, mixed urinary incontinence, history of respiratory distress requiring intubation in 2020.  Severe Sepsis- resolved   Acute on Chronic Hypoxic Respiratory Failure 2/2 to CAP  Vitals overnight show patient saturating 89-99% on 6L [place]. Has remained afebrile. WBC is 39.6 from 36.3. LA down to 1.7 from 3.8. Blood culture and urine culture pending. Was saturating 92% in room while in the room with coarse crackle throughout posterior fields  -f/u cultures -f/u WBC given marked leukocytosis -monitor fever curve -prednisone course day 1/4 -lasix 40 IV x 1 -echocardiogram -duonebs -IS -PT/OT -CBC with diff adding  Afib and need for chronic anticoagulation Rates have been 70s-90s most recently. Has not been able to afford eliquis for 8-9 months. -eliquis 5 mg BID -amiodarone 100 mg daily -holding home metoprolol given soft pressures -EKG -Keep K>4 and Mag >2  History of LVH without outflow obstruction HFpEF On examination appeared 1+ pitting edema to knees bilaterally.  Coarse crackles throughout lung fields posteriorly -Repeat Echocardiogram -monitor and consider lasix   HTN BP range of 101-165-41-106 -hold home amlodipine, losartan, metoprolol  CAD s/p stents last in 2013 Hx of [place] -home med of rosuvastatin 20 mg -lipid panel  Chronic/stable Hx of Qtc prolongation-monitor Chronic  pain/RLS-pregabalin 75 mg BID, consider iron panel  Tobacco Use-nicotine patch 7 mg daily Anxiety-continue buspar, cymbalta GERD-pantoprazole 40 mg Osteoporosis-prolia injections q 6 months, vitamin D q7days Macular degeneration-A reds  FEN/GI: Heart Healthy PPx: Eliquis 5 mg BID Dispo:Home pending decreased O2 requirement and PT recs  Subjective:  Patient feels like her breathing is gotten much better she continues to sit upright in her chair and was requiring 6 L/min  Objective: Temp:  [98.4 F (36.9 C)-101.3 F (38.5 C)] 98.7 F (37.1 C) (02/13 0330) Pulse Rate:  [79-123] 79 (02/13 0330) Resp:  [11-34] 20 (02/13 0330) BP: (101-168)/(41-106) 135/60 (02/13 0330) SpO2:  [89 %-99 %] 93 % (02/13 0330) FiO2 (%):  [100 %] 100 % (02/12 0841) Weight:  [107.7 kg] 107.7 kg (02/12 2021) Physical Exam: General: NAD, alert and oriented responsive to all questions Cardiovascular: RRR no murmurs rubs or gallops Respiratory: Coarse crackles throughout posterior lung field Abdomen: Soft, nontender to palpation Extremities: 1+ pitting edema to knees bilaterally  Laboratory: Recent Labs  Lab 12/30/21 0807 12/30/21 0825 12/31/21 0153  WBC 36.3*  --  39.6*  HGB 15.0 15.3* 13.7  HCT 48.5* 45.0 42.3  PLT 252  --  197   Recent Labs  Lab 12/30/21 0807 12/30/21 0825  NA 138 139  K 4.9 3.8  CL 103  --   CO2 21*  --   BUN 12  --   CREATININE 0.98  --   CALCIUM 9.1  --   PROT 6.7  --   BILITOT 1.5*  --   ALKPHOS 71  --   ALT 17  --   AST 39  --   GLUCOSE 163*  --  Imaging/Diagnostic Tests: DG Chest Port 1 View  Result Date: 12/30/2021 CLINICAL DATA:  Dyspnea EXAM: PORTABLE CHEST 1 VIEW COMPARISON:  11/25/2019 FINDINGS: Pulmonary opacity at the bases, fairly hazy except for a asymmetrically focal opacity in the peripherally of the right lung. Cardiopericardial enlargement likely accentuated by mediastinal fat. Pleural fluid is not excluded. No pneumothorax. IMPRESSION: Low  volume chest with infiltrate at the bases, more dense on the right. The asymmetry favors pneumonia. Electronically Signed   By: Jorje Guild M.D.   On: 12/30/2021 08:51     Gerrit Heck, MD 12/31/2021, 4:49 AM PGY-1, [facility] Intern pager: [phone], text pages welcome

## 2021-12-31 NOTE — Evaluation (Signed)
Occupational Therapy Evaluation Patient Details Name: Martha Mora MRN: [id number] DOB: [birth year] Today's Date: 12/31/2021   History of Present Illness Patient is a 76 y/o female who presents on 12/30/21 with dizziness, syncope, SOB. Admitted with sepsis and acute on chronic hypoxemic respiratory failure and AMS likely secondary to PNA. PMH includes CAD, A-fib, COPD, HTN, breast ca in remission, anxiety, MI, osteoporosis   Clinical Impression   Pt admitted with the above diagnoses and presents with below problem list. Pt will benefit from continued acute OT to address the below listed deficits and maximize independence with basic ADLs prior to d/c home. PTA pt was mod I with ADLs, utilizes walker. She endorses being mostly home-bound, has groceries delivered. Pt reporting dizziness throughout session with onset "2 days ago." Pt on 6L O2 throughout session with SpO2 91 after walking household distance, recovered up to 93 quickly at rest.       Recommendations for follow up therapy are one component of a multi-disciplinary discharge planning process, led by the attending physician.  Recommendations may be updated based on patient status, additional functional criteria and insurance authorization.   Follow Up Recommendations  Home health OT    Assistance Recommended at Discharge Intermittent Supervision/Assistance  Patient can return home with the following      Functional Status Assessment  Patient has had a recent decline in their functional status and demonstrates the ability to make significant improvements in function in a reasonable and predictable amount of time.  Equipment Recommendations  None recommended by OT    Recommendations for Other Services       Precautions / Restrictions Precautions Precautions: Other (comment);Fall Precaution Comments: watch 02 Restrictions Weight Bearing Restrictions: No      Mobility Bed Mobility               General bed  mobility comments: not observed, in recliner    Transfers Overall transfer level: Needs assistance Equipment used: Rolling walker (2 wheels) Transfers: Sit to/from Stand Sit to Stand: Supervision           General transfer comment: to/from recliner      Balance Overall balance assessment: Needs assistance Sitting-balance support: Feet supported, No upper extremity supported Sitting balance-Leahy Scale: Good     Standing balance support: During functional activity Standing balance-Leahy Scale: Poor Standing balance comment: Requires UE support for dynamic tasks                           ADL either performed or assessed with clinical judgement   ADL Overall ADL's : Needs assistance/impaired Eating/Feeding: Set up;Sitting   Grooming: Set up;Sitting   Upper Body Bathing: Set up;Sitting   Lower Body Bathing: Min guard;Sit to/from stand   Upper Body Dressing : Set up;Sitting   Lower Body Dressing: Min guard;Sit to/from stand   Toilet Transfer: Min guard;Ambulation;Rolling walker (2 wheels)   Toileting- Clothing Manipulation and Hygiene: Min guard;Sitting/lateral lean;Sit to/from stand   Tub/ Shower Transfer: Tub transfer;Min guard;Rolling walker (2 wheels);Shower Scientist, research (medical) Details (indicate cue type and reason): clinical judgement Functional mobility during ADLs: Min guard;Rolling walker (2 wheels) General ADL Comments: Pt completed functional transfers and household distance functional mobility. On 6L O2 throughout session, SpO2 91 at end of mobility, recovered to 93 quickly. began energy conservation education as related to ADLs.     Vision Baseline Vision/History: 1 Wears glasses       Perception     Praxis  Pertinent Vitals/Pain Pain Assessment Pain Assessment: No/denies pain     Hand Dominance Right   Extremity/Trunk Assessment Upper Extremity Assessment Upper Extremity Assessment: Overall WFL for tasks assessed    Lower Extremity Assessment Lower Extremity Assessment: Defer to PT evaluation   Cervical / Trunk Assessment Cervical / Trunk Assessment: Normal   Communication Communication Communication: No difficulties   Cognition Arousal/Alertness: Awake/alert Behavior During Therapy: WFL for tasks assessed/performed Overall Cognitive Status: Within Functional Limits for tasks assessed                                 General Comments: "I didn't understabd I needed to be on the oxygen at home but i understand now."     General Comments  Pt reporting dizziness with onset "2 days ago"    Exercises     Shoulder Instructions      Home Living Family/patient expects to be discharged to:: Private residence Living Arrangements: Children;Alone (with daughter and son in law for the next 2 weeks than moving into a cottage next to her son's house) Available Help at Discharge: Family;Available 24 hours/day Type of Home: House Home Access: Stairs to enter [employer] of Steps: 1 flight Entrance Stairs-Rails: Right Home Layout: One level     Bathroom Shower/Tub: Teacher, early years/pre: Standard     Home Equipment: Conservation officer, nature (2 wheels);BSC/3in1;Rollator (4 wheels);Other (comment);Cane - single point;Shower seat   Additional Comments: has a motorized scooter, does not use at baseline      Prior Functioning/Environment Prior Level of Function : Independent/Modified Independent             Mobility Comments: USes RW for ambulation, rollator for community, drives. ADLs Comments: mod I        OT Problem List: Decreased strength;Decreased activity tolerance;Impaired balance (sitting and/or standing);Decreased knowledge of precautions;Decreased knowledge of use of DME or AE      OT Treatment/Interventions: Self-care/ADL training;Therapeutic exercise;Energy conservation;DME and/or AE instruction;Patient/family education;Balance training    OT  Goals(Current goals can be found in the care plan section) Acute Rehab OT Goals Patient Stated Goal: "not tire out so easily" OT Goal Formulation: With patient Time For Goal Achievement: 01/14/22 Potential to Achieve Goals: Good ADL Goals Pt Will Perform Grooming: with modified independence;sitting;standing Pt Will Perform Lower Body Dressing: with modified independence;sit to/from stand Pt Will Perform Tub/Shower Transfer: Tub transfer;with supervision;shower seat;rolling walker Additional ADL Goal #1: Pt will independently verbalize 2 energy conservation strategies to use for ADL managment  at home.  OT Frequency: Min 2X/week    Co-evaluation              AM-PAC OT "6 Clicks" Daily Activity     Outcome Measure Help from another person eating meals?: None Help from another person taking care of personal grooming?: A Little Help from another person toileting, which includes using toliet, bedpan, or urinal?: A Little Help from another person bathing (including washing, rinsing, drying)?: A Little Help from another person to put on and taking off regular upper body clothing?: None Help from another person to put on and taking off regular lower body clothing?: A Little 6 Click Score: 20   End of Session Equipment Utilized During Treatment: Rolling walker (2 wheels);Oxygen  Activity Tolerance: Patient tolerated treatment well Patient left: in chair;with call bell/phone within reach  OT Visit Diagnosis: Unsteadiness on feet (R26.81);Pain  Time: 5465-0354 OT Time Calculation (min): 20 min Charges:  OT General Charges $OT Visit: 1 Visit OT Evaluation $OT Eval Low Complexity: [place], OT Acute Rehabilitation Services Office: [phone]   Hortencia Pilar 12/31/2021, 12:35 PM

## 2022-01-01 ENCOUNTER — Other Ambulatory Visit (HOSPITAL_COMMUNITY): Payer: Self-pay

## 2022-01-01 ENCOUNTER — Inpatient Hospital Stay (HOSPITAL_COMMUNITY): Payer: Medicare Other

## 2022-01-01 DIAGNOSIS — J441 Chronic obstructive pulmonary disease with (acute) exacerbation: Secondary | ICD-10-CM

## 2022-01-01 LAB — CBC WITH DIFFERENTIAL/PLATELET
Abs Immature Granulocytes: 0.14 10*3/uL — ABNORMAL HIGH (ref 0.00–0.07)
Basophils Absolute: 0.1 10*3/uL (ref 0.0–0.1)
Basophils Relative: 0 %
Eosinophils Absolute: 0 10*3/uL (ref 0.0–0.5)
Eosinophils Relative: 0 %
HCT: 42.1 % (ref 36.0–46.0)
Hemoglobin: 13.6 g/dL (ref 12.0–15.0)
Immature Granulocytes: 1 %
Lymphocytes Relative: 10 %
Lymphs Abs: 2.5 10*3/uL (ref 0.7–4.0)
MCH: 31.1 pg (ref 26.0–34.0)
MCHC: 32.3 g/dL (ref 30.0–36.0)
MCV: 96.1 fL (ref 80.0–100.0)
Monocytes Absolute: 1.4 10*3/uL — ABNORMAL HIGH (ref 0.1–1.0)
Monocytes Relative: 6 %
Neutro Abs: 20.2 10*3/uL — ABNORMAL HIGH (ref 1.7–7.7)
Neutrophils Relative %: 83 %
Platelets: 206 10*3/uL (ref 150–400)
RBC: 4.38 MIL/uL (ref 3.87–5.11)
RDW: 15.9 % — ABNORMAL HIGH (ref 11.5–15.5)
WBC: 24.3 10*3/uL — ABNORMAL HIGH (ref 4.0–10.5)
nRBC: 0 % (ref 0.0–0.2)

## 2022-01-01 LAB — BASIC METABOLIC PANEL
Anion gap: 12 (ref 5–15)
BUN: 35 mg/dL — ABNORMAL HIGH (ref 8–23)
CO2: 24 mmol/L (ref 22–32)
Calcium: 8.8 mg/dL — ABNORMAL LOW (ref 8.9–10.3)
Chloride: 102 mmol/L (ref 98–111)
Creatinine, Ser: 1.1 mg/dL — ABNORMAL HIGH (ref 0.44–1.00)
GFR, Estimated: 51 mL/min — ABNORMAL LOW (ref >60)
Glucose, Bld: 133 mg/dL — ABNORMAL HIGH (ref 70–99)
Potassium: 4 mmol/L (ref 3.5–5.1)
Sodium: 138 mmol/L (ref 135–145)

## 2022-01-01 LAB — FERRITIN: Ferritin: 65 ng/mL (ref 11–307)

## 2022-01-01 MED ORDER — FUROSEMIDE 10 MG/ML IJ SOLN
40.0000 mg | Freq: Once | INTRAMUSCULAR | Status: AC
  Administered 2022-01-01: 40 mg via INTRAVENOUS
  Filled 2022-01-01: qty 4

## 2022-01-01 MED ORDER — METOPROLOL TARTRATE 12.5 MG HALF TABLET
12.5000 mg | ORAL_TABLET | Freq: Two times a day (BID) | ORAL | Status: DC
  Administered 2022-01-01 – 2022-01-02 (×3): 12.5 mg via ORAL
  Filled 2022-01-01 (×3): qty 1

## 2022-01-01 MED ORDER — ASPIRIN EC 81 MG PO TBEC
81.0000 mg | DELAYED_RELEASE_TABLET | Freq: Every day | ORAL | Status: DC
  Administered 2022-01-01 – 2022-01-02 (×2): 81 mg via ORAL
  Filled 2022-01-01 (×2): qty 1

## 2022-01-01 MED ORDER — TRAZODONE HCL 100 MG PO TABS
100.0000 mg | ORAL_TABLET | Freq: Every evening | ORAL | Status: DC | PRN
  Administered 2022-01-01 – 2022-01-02 (×2): 100 mg via ORAL
  Filled 2022-01-01 (×2): qty 1

## 2022-01-01 MED ORDER — CEFDINIR 300 MG PO CAPS
300.0000 mg | ORAL_CAPSULE | Freq: Two times a day (BID) | ORAL | Status: DC
  Administered 2022-01-02: 300 mg via ORAL
  Filled 2022-01-01 (×3): qty 1

## 2022-01-01 MED ORDER — RIVAROXABAN 20 MG PO TABS
20.0000 mg | ORAL_TABLET | Freq: Every day | ORAL | Status: DC
  Administered 2022-01-01: 20 mg via ORAL
  Filled 2022-01-01: qty 1

## 2022-01-01 MED ORDER — RIVAROXABAN 10 MG PO TABS
10.0000 mg | ORAL_TABLET | Freq: Every day | ORAL | Status: DC
Start: 2022-01-01 — End: 2022-01-01

## 2022-01-01 NOTE — Plan of Care (Signed)
°  Problem: Respiratory: Goal: Ability to maintain a clear airway will improve Outcome: Progressing Goal: Levels of oxygenation will improve Outcome: Progressing Goal: Ability to maintain adequate ventilation will improve Outcome: Progressing   Problem: Activity: Goal: Ability to tolerate increased activity will improve Outcome: Progressing

## 2022-01-01 NOTE — TOC Benefit Eligibility Note (Signed)
Patient Teacher, English as a foreign language completed.    The patient is currently admitted and upon discharge could be taking Xarelto 20 mg.  The current 30 day co-pay is, $47.00.   The patient is insured through [employer], [place] Patient Advocate Specialist [place] Patient Advocate Team Direct Number: [phone]  Fax: [phone]
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
## 2022-01-01 NOTE — TOC Initial Note (Addendum)
Transition of Care (TOC) - Initial/Assessment Note  Marvetta Gibbons RN, BSN Transitions of Care Unit 4E- RN Case Manager See Treatment Team for direct phone #    Patient Details  Name: Martha Mora MRN: [id number] Date of Birth: 20-Oct-1946  Transition of Care [facility]) CM/SW Contact:    Dawayne Patricia, RN Phone Number: 01/01/2022, 3:30 PM  Clinical Narrative:                 Pt from home, noted order for [facility]- CM spoke with pt at bedside who is agreeable to [facility] services- however HHOT can not stand alone with HH in the home- pt will need either HHPT added for home safety eval or HHRN for disease management.   List provided to pt for [facility] choice- Per CMS guidelines from medicare.gov website with star ratings (copy placed in shadow chart) , per pt she believes she has used [facility] or [facility] in the past- states she really liked the [facility] person that came out and would like to see if that same person can come back.  Also discussed home 02 with pt- pt reports she has home 02 with [employer], her kittens "chewed up" her nasal cannula and she needs new tubing. Patient also reports she has been working with her PCP for the smaller tanks, Pt will need a transport tank to go home with.  [facility]   Address for discharge confirmed with pt as her daughter's [name] [address], [place] [address], daughter's phone- [phone] Pt states she will be staying her for awhile longer before hopefully returning to her home address - which is the [facility]. Address.   Call made to [facility] with [facility] for [facility] referral- per Tommi Rumps they did receive referral back in 2021- however pt was actually active with [facility] at the time so referral was not accepted and referred back to [facility]- Call made to [place] with [facility] for [facility] referral- msg left and awaiting return call at this time.   Call made to [facility] with [employer] for home 02 needs, per review in [employer] system pt had home 02 with them  but looks like she returned tanks, per pt she still has concentrator in the home. Zach to f/u for home 02 needs. Pt will need new home 02 DME order, with new qualifying ambulatory sat note.  Thedore Mins will f/u for transport tank as well for pt to transport home with, and f/u for smaller tank request made by patient.   Expected Discharge Plan: [place] Barriers to Discharge: Continued Medical Work up   Patient Goals and CMS Choice   CMS Medicare.gov Compare Post Acute Care list provided to:: Patient Choice offered to / list presented to : Patient  Expected Discharge Plan and Services Expected Discharge Plan: [place]   Discharge Planning Services: CM Consult Post Acute Care Choice: Home Health, Durable Medical Equipment Living arrangements for the past 2 months: Single Family Home                 DME Arranged: Oxygen DME Agency: [employer] Date DME Agency Contacted: 01/01/22 Time DME Agency Contacted: 1500 Representative spoke with at DME Agency: Thedore Mins HH Arranged: OT [place]: [place] Date [place]: 01/01/22 Time [place]: 1528 Representative spoke with at [place]: [place]  Prior Living Arrangements/Services Living arrangements for the past 2 months: [place] with:: Self, Adult  Children Patient language and need for interpreter reviewed:: Yes Do you feel safe going back to the place where you live?: Yes      Need for Family Participation in Patient Care: Yes (Comment) Care giver support system in place?: Yes (comment) Current home services: DME (home 02) Criminal Activity/Legal Involvement Pertinent to Current Situation/Hospitalization: No - Comment as needed  Activities of Daily Living      Permission Sought/Granted Permission sought to share information with : Facility Art therapist granted to share information with : Yes, Verbal Permission Granted     Permission  granted to share info w AGENCY: HH/DME        Emotional Assessment Appearance:: Appears stated age Attitude/Demeanor/Rapport: Engaged Affect (typically observed): Accepting, Appropriate, Pleasant Orientation: : Oriented to Self, Oriented to Place, Oriented to  Time, Oriented to Situation Alcohol / Substance Use: Not Applicable Psych Involvement: No (comment)  Admission diagnosis:  COPD exacerbation ([place]) [J44.1] CAP (community acquired pneumonia) [T24.4] Acute metabolic encephalopathy [Q28.63] Community acquired pneumonia, unspecified laterality [J18.9] Patient Active Problem List   Diagnosis Date Noted   CAP (community acquired pneumonia) 12/30/2021   SCC (squamous cell carcinoma), hand, right 05/17/2021   Actinic keratoses 03/29/2021   Dysuria 81/77/1165   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   Genetic testing 02/12/2021   Chronic obstructive pulmonary disease ([place]) 02/05/2021   Lumbar radiculopathy 02/05/2021   Atrial fibrillation ([place]) 01/09/2021   Vitamin D deficiency 01/09/2021   Seborrheic keratoses 01/09/2021   Urinary urgency 11/21/2020   Vertigo 06/12/2020   Vocal cord dysfunction 06/12/2020   Chronic respiratory failure ([place]) 06/12/2020   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 07/16/2019   Renal stones 03/15/2019   Insomnia 09/08/2016   Restless leg syndrome 05/24/2015   History of tobacco use 05/23/2014   Mitral valve regurgitation 05/23/2014   Dysfunctional uterine bleeding 09/02/2013   Breast cancer of lower-outer quadrant of right female breast ([place]) 07/24/2013   Postmenopausal bleeding 07/14/2013   Osteopenia 12/27/2011   Anxiety 07/19/2009   Osteoarthritis 07/19/2009   IRRITABLE BOWEL SYNDROME 11/17/2008   COLONIC POLYPS, ADENOMATOUS 11/17/2007   Hyperlipidemia 11/17/2007   Obesity, unspecified 11/17/2007   Depression 11/17/2007   Essential hypertension 11/17/2007   Coronary atherosclerosis 11/17/2007   HEMORRHOIDS, INTERNAL 11/17/2007   GERD  11/17/2007   PEPTIC STRICTURE 11/17/2007   OSA (obstructive sleep apnea) 11/17/2007   Diverticulosis of colon (without mention of hemorrhage) 09/05/2005   ESOPHAGEAL STRICTURE 06/01/2002   PCP:  [name], DO [facility]   [id number] [place] [address] [id number]: [phone]: [phone]    Social Determinants of Health (SDOH) Interventions    Readmission Risk Interventions Readmission Risk Prevention Plan 11/26/2019  Transportation Screening Complete  PCP or Specialist Appt within 3-5 Days Complete  HRI or Home Care Consult Complete  Social Work Consult for [place]sel[place] Complete  Palliative Care Screening Not Applicable  Medication Review (Press photographer Complete  Some recent data might be hidden

## 2022-01-01 NOTE — Hospital Course (Addendum)
Martha Mora is a 76 y.o. female with a history of breast cancer, anxiety, hypertension, CAD s/p 3 stents in 2005, esophageal stricture, GERD, diverticulosis, IBS, OA, OSA, MVR, RLS, mixed urinary incontinence, history of respiratory distress requiring intubation in 2020 who presented with severe sepsis secondary to community-acquired pneumonia. Hospital course outlined by problem below:  Severe sepsis Acute on chronic hypoxemic respiratory failure secondary to CAP/COPD Patient was admitted for respiratory distress with increasing oxygen requirement from baseline (2.5 L) and meeting severe sepsis criteria.  Initially required BiPAP in the ED but was able to weaned to nasal cannula within a few hours. CXR with evidence of pneumonia. She was treated with broad spectrum antibiotics which was de-escalated to cefdinir for a 5-day total course prior to discharge. Lasix and duonebs also helped with breathing as well. Did receive steroid course as well. On discharge, she was breathing comfortably on 2L [place]. She was set up for new home oxygen prior to discharge.   A fib and need for chronic anticoagulation Was rate controlled in the hospital.  Amiodarone was continued as well as metoprolol.  Patient had medication cost issues with Eliquis and had not taken for several months. Xarelto was started in hospital with patient assistance program.  HFpEF Was not on diuretics at home.  Was given Lasix during hospitalization with improvement in breathing.  Did have lower extremity 1+ pitting edema.  As needed Lasix given to patient at discharge  Leukocytosis WBC was up to 39.6 in hospitalization and came down to 15.8 at end of hospitalization.  Likely reactive from illness and steroids.  Pathology smear review was ordered and pending at discharge.  Chronic conditions remained stable

## 2022-01-01 NOTE — Progress Notes (Signed)
Mobility Specialist Progress Note   01/01/22 1610  Mobility  Activity Ambulated with assistance in hallway  Level of Assistance Contact guard assist, steadying assist  Distance Ambulated (ft) 470 ft  Activity Response Tolerated well  $Mobility charge 1 Mobility   Performed ambulatory saturation qualification. [place] seated at RA pt dropped to 84% SpO2, Pt while ambulating on RA was fluctuating around 85% SpO2. Pt requesting O2 after x2 standing rest breaks and an inc in SOB. Per advisement of RN placed on 2LO2 where pt maintained ~91% SpO2 until requesting a seated break, pt requesting a little more oxygen and placed on 3L where they remained >95% SpO2 for remainder of session. Returned back to chair w/ no further complaint and placed on 2LO2 while in chair, per advisement of RN.   Pre Mobility: 84% SpO2 on RA During Mobility: 97% SpO2 on 3L Post Mobility: 94% SpO2 on 2L  Holland Falling Mobility Specialist Phone Number [phone]

## 2022-01-01 NOTE — Progress Notes (Signed)
FPTS Interim Progress Note  S: Reports unable to sleep without her sleeping medicine.   Notes cough for the past few weeks that got better with albuterol. She would like to leave the hospital as soon as possible. Her immunocompromised daughter has MAC lung disease.   O: BP 138/66 (BP Location: Left Arm)    Pulse 74    Temp 98 F (36.7 C) (Oral)    Resp 20    Ht _0  (1.549 m)    Wt 107.7 kg    SpO2 98%    BMI 44.86 kg/m    GEN: sitting in chair watching TV. RESP: equal chest rise and fall, speaking in full sentences without pause, 6L [place]    A/P: AoC Hypoxic Resp Failure: Consider MAC workup   Difficulty sleeping: restart home Trazodone  - Orders reviewed. Labs for AM ordered, which was adjusted as needed.   [name], DO 01/01/2022, 1:14 AM PGY-3, [place] [facility] Night Resident  Please page [phone] with questions.

## 2022-01-01 NOTE — Progress Notes (Signed)
Family Medicine Teaching Service Daily Progress Note Intern Pager: [phone]  Patient name: Martha Mora Medical record number: [id number] Date of birth: 1946/02/06 Age: 76 y.o. Gender: female  Primary Care Provider: [name], DO Consultants: None Code Status: Full  Pt Overview and Major Events to Date:  1/12 admitted  Assessment and Plan:  Martha Mora is a 76 y.o. female presenting with respiratory distress. PMH is significant for history of breast cancer, anxiety, hypertension, CAD s/p 3 stents in 2005, esophageal stricture, GERD, diverticulosis, IBS, OA, OSA, MVR, RLS, mixed urinary incontinence, history of respiratory distress requiring intubation in 2020.  Severe Sepsis- resolved   Acute on Chronic Hypoxic Respiratory Failure 2/2 to CAP + COPD Vitals show patient on 6 L [place] and saturating in 90s. Remains afebrile. WBC down to 24.3 from 26.3, neutrophilic predominance. Has not been tachypneic. Home health OT rec. Blood cultures and urine cultures NGTD. Received diuresis yesterday. Cr 1.10 from 1.06 yesterday. CXR seems improved -F/U wbc -monitor fever curve -lasix 40 mg x1 -prednisone course 2/4 -azithro day 3 complet -can consider cefdinir  for oral antibiotic transition -duonebs scheduled BID -ambulate with O2 -IS -PT/OT  Afib and need forr chronic anticoagulation Rates have been 69-70s. Has not been able to afford eliquis, aptient assitance program in for xarelto. EKG ordered but not performed -eliquis 5 mg BID to xarelto -amiodarone 100 mg daily -home metoprolol restart -Keep K>4, Mag >2  HFpEF On examination appeared to have coarse crackles in lower lung fields. Echo showed grade II diastolic dysfunction and EF 65-70% with severely dilated L atrial size. UOP was 304. -lasix x 1  Dysphagia Patient says she felt like her throat closing yesterday, not having as much dysphagia. -SLP consulted  HTN  BP ranges of 134-138/54-66. -hold home  antihypertensives  CAD s/p stents and MI hx Lipid panel showed total cholesterol of 146, LDL of 70, TG 88. Said a long time ago she took aspirin but not anymore. No particular reason she stopped. Denied any GI bleeding.  -continue home medication of rosuvastatin 20 mg  -aspirin restart  Chronic/stable Hx of Qtc prolongation-monitor on EKG Chronic pain/RLS-pregabalin, ferritin was 65 Tobacco use-nicotine patch 7 mg daily Anxiety-continue buspar and cymbalta GERD-protonix 40 mg Osteoporosis-prolia injections q 6 months, vitamin D weekly Macular degeneration- A reds  FEN/GI: Heart Healthy PPx: eliquis 5 mg BID to xarelto Dispo:Home pending respiratory status improvement  Subjective:  Like her breathing is better and feels like her swallowing issues have gotten better as well.  Objective: Temp:  [97.7 F (36.5 C)-98.5 F (36.9 C)] 97.7 F (36.5 C) (02/14 0326) Pulse Rate:  [69-74] 73 (02/14 0326) Resp:  [14-20] 20 (02/14 0326) BP: (134-138)/(54-66) 137/62 (02/14 0326) SpO2:  [93 %-98 %] 95 % (02/14 0326) Physical Exam: General: NAD, sitting upright in chair Cardiovascular: RRR no murmurs rubs or gallops Respiratory: Coarse crackles in bases, speaking full sentences Abdomen: Nontender, soft Extremities: No lower extremity edema  Laboratory: Recent Labs  Lab 12/30/21 0807 12/30/21 0825 12/31/21 0153 01/01/22 0209  WBC 36.3*  --  39.6* 24.3*  HGB 15.0 15.3* 13.7 13.6  HCT 48.5* 45.0 42.3 42.1  PLT 252  --  197 206   Recent Labs  Lab 12/30/21 0807 12/30/21 0825 12/31/21 0833 01/01/22 0209  NA 138 139 139 138  K 4.9 3.8 4.5 4.0  CL 103  --  104 102  CO2 21*  --  26 24  BUN 12  --  30* 35*  CREATININE 0.98  --  1.06* 1.10*  CALCIUM 9.1  --  9.3 8.8*  PROT 6.7  --   --   --   BILITOT 1.5*  --   --   --   ALKPHOS 71  --   --   --   ALT 17  --   --   --   AST 39  --   --   --   GLUCOSE 163*  --  140* 133*    Imaging/Diagnostic Tests:   Gerrit Heck,  MD 01/01/2022, 4:58 AM PGY-1, [place] Intern pager: [phone], text pages welcome

## 2022-01-01 NOTE — Progress Notes (Signed)
Spoke with patient and she feels more comfortable monitoring respiratory status tonight with likely plan to discharge tomorrow

## 2022-01-01 NOTE — Consult Note (Addendum)
° °  [facility] Inpatient Consult   01/01/2022  Quinette Hentges 1946/06/16 [id number]  [place] [facility] [ACO] Patient: [name]  01/02/22 12:20 pm: Met with patient at the bedside regarding follow up needs for post hospital at the Embedded provider.  Patient endorses PCP and explained post hospital follow up with an Embedded CCM team member.  Patient recalls [facility] Care Management in the past and explaiined about CCM at the practice. Plan:  Will make a referral for CCM nursing for follow up  Primary Care Provider[PCP]:  [name], DO  is an embedded provider with a Chronic Care Management team and program, and is listed for the transition of care follow up and appointments.  10/31/22 Patient was screened for Embedded practice service needs for chronic care management. Patient had been out reached about and the team notes difficulty maintaining contact.  Plan: Will continue to follow for referral request [place] Management for post hospital needs.  Please contact for further questions,  Natividad Brood, RN BSN [facility] Liaison  [phone] business mobile phone Toll free office [phone]  Fax number: [phone] [email or web address]

## 2022-01-01 NOTE — Progress Notes (Signed)
SATURATION QUALIFICATIONS: (This note is used to comply with regulatory documentation for home oxygen)  Patient Saturations on Room Air at Rest = 84%  Patient Saturations on Room Air while Ambulating = 85%  Patient Saturations on 3 Liters of oxygen while Ambulating = 97%  Please briefly explain why patient needs home oxygen:  Due to patients current condition she requires this amount of oxygen so she does not become short of breath.

## 2022-01-01 NOTE — Progress Notes (Signed)
FPTS Brief Note Reviewed patient's vitals, recent notes.  Vitals:   01/01/22 1959 01/01/22 2018  BP: (!) 151/67   Pulse: 71 70  Resp:  (!) 21  Temp: 98.3 F (36.8 C)   SpO2: 91% 93%   Resp status at baseline: 2L [place]   At this time, no change in plan from day progress note.  [name], DO Page [phone] with questions about this patient.

## 2022-01-02 ENCOUNTER — Encounter: Payer: Self-pay | Admitting: Hematology

## 2022-01-02 ENCOUNTER — Other Ambulatory Visit (HOSPITAL_COMMUNITY): Payer: Self-pay

## 2022-01-02 LAB — CBC WITH DIFFERENTIAL/PLATELET
Abs Immature Granulocytes: 0.08 10*3/uL — ABNORMAL HIGH (ref 0.00–0.07)
Basophils Absolute: 0 10*3/uL (ref 0.0–0.1)
Basophils Relative: 0 %
Eosinophils Absolute: 0 10*3/uL (ref 0.0–0.5)
Eosinophils Relative: 0 %
HCT: 43.3 % (ref 36.0–46.0)
Hemoglobin: 14.3 g/dL (ref 12.0–15.0)
Immature Granulocytes: 1 %
Lymphocytes Relative: 14 %
Lymphs Abs: 2.3 10*3/uL (ref 0.7–4.0)
MCH: 31.3 pg (ref 26.0–34.0)
MCHC: 33 g/dL (ref 30.0–36.0)
MCV: 94.7 fL (ref 80.0–100.0)
Monocytes Absolute: 0.9 10*3/uL (ref 0.1–1.0)
Monocytes Relative: 6 %
Neutro Abs: 12.5 10*3/uL — ABNORMAL HIGH (ref 1.7–7.7)
Neutrophils Relative %: 79 %
Platelets: 233 10*3/uL (ref 150–400)
RBC: 4.57 MIL/uL (ref 3.87–5.11)
RDW: 15.6 % — ABNORMAL HIGH (ref 11.5–15.5)
WBC: 15.8 10*3/uL — ABNORMAL HIGH (ref 4.0–10.5)
nRBC: 0 % (ref 0.0–0.2)

## 2022-01-02 LAB — BASIC METABOLIC PANEL
Anion gap: 10 (ref 5–15)
BUN: 29 mg/dL — ABNORMAL HIGH (ref 8–23)
CO2: 28 mmol/L (ref 22–32)
Calcium: 9.1 mg/dL (ref 8.9–10.3)
Chloride: 101 mmol/L (ref 98–111)
Creatinine, Ser: 1.01 mg/dL — ABNORMAL HIGH (ref 0.44–1.00)
GFR, Estimated: 57 mL/min — ABNORMAL LOW (ref >60)
Glucose, Bld: 113 mg/dL — ABNORMAL HIGH (ref 70–99)
Potassium: 4.3 mmol/L (ref 3.5–5.1)
Sodium: 139 mmol/L (ref 135–145)

## 2022-01-02 LAB — PATHOLOGIST SMEAR REVIEW

## 2022-01-02 MED ORDER — AMIODARONE HCL 200 MG PO TABS
100.0000 mg | ORAL_TABLET | Freq: Every day | ORAL | 0 refills | Status: DC
  Filled 2022-01-02: qty 30, 60d supply, fill #0

## 2022-01-02 MED ORDER — RIVAROXABAN 20 MG PO TABS
20.0000 mg | ORAL_TABLET | Freq: Every day | ORAL | 0 refills | Status: DC
  Filled 2022-01-02: qty 30, 30d supply, fill #0

## 2022-01-02 MED ORDER — LOPERAMIDE HCL 2 MG PO CAPS
2.0000 mg | ORAL_CAPSULE | ORAL | Status: DC | PRN
  Administered 2022-01-02: 2 mg via ORAL
  Filled 2022-01-02: qty 1

## 2022-01-02 MED ORDER — ASPIRIN 81 MG PO TBEC
81.0000 mg | DELAYED_RELEASE_TABLET | Freq: Every day | ORAL | 0 refills | Status: DC
  Filled 2022-01-02: qty 30, 30d supply, fill #0

## 2022-01-02 MED ORDER — FUROSEMIDE 20 MG PO TABS
20.0000 mg | ORAL_TABLET | Freq: Every day | ORAL | 0 refills | Status: DC | PRN
  Filled 2022-01-02: qty 30, 30d supply, fill #0

## 2022-01-02 MED ORDER — PREDNISONE 20 MG PO TABS
40.0000 mg | ORAL_TABLET | Freq: Every day | ORAL | 0 refills | Status: DC
  Filled 2022-01-02: qty 2, 1d supply, fill #0

## 2022-01-02 MED ORDER — CEFDINIR 300 MG PO CAPS
300.0000 mg | ORAL_CAPSULE | Freq: Two times a day (BID) | ORAL | 0 refills | Status: DC
  Filled 2022-01-02: qty 3, 2d supply, fill #0

## 2022-01-02 NOTE — TOC Transition Note (Signed)
Transition of Care (TOC) - CM/SW Discharge Note Martha Gibbons RN, BSN Transitions of Care Unit 4E- RN Case Manager See Treatment Team for direct phone #    Patient Details  Name: Martha Mora MRN: [id number] Date of Birth: Jan 16, 1946  Transition of Care [facility]) CM/SW Contact:  Martha Patricia, RN Phone Number: 01/02/2022, 11:24 AM   Clinical Narrative:    Pt stable for transition home today, new home 02 orders have been placed and transport tank and portable concentrator have been delivered to room by [employer]. Pt states she has stationary concentrator at home. [employer] to follow up on any further home 02 needs as well as assess for smaller portable tank needs at home.   Follow up done with [facility]- per Martha Mora they can accept referral- however do not currently have OT available- can offer RN/PT at this time. Updated MD and [place] orders modified to HHRN/PT- CM spoke with pt at bedside to clarify [facility] agency that serviced her last time Martha Mora) and available services- pt agreeable to [place] again as well understands OT is not available but HHRN/PT have been ordered and will f/u with her in the home.  Msg sent to Martha Mora with [facility] that [facility] orders have been placed and pt is going home today. Martha Mora will follow to schedule start of care.   [facility] pharmacy to fill and deliver meds to bedside prior to discharge.    Final next level of care: [place] Barriers to Discharge: Barriers Resolved   Patient Goals and CMS Choice Patient states their goals for this hospitalization and ongoing recovery are:: return home CMS Medicare.gov Compare Post Acute Care list provided to:: Patient Choice offered to / list presented to : Patient  Discharge Placement                 Home w/ [facility]      Discharge Plan and Services   Discharge Planning Services: CM Consult Post Acute Care Choice: Home Health, Durable Medical Equipment          DME Arranged: Oxygen DME Agency:  [facility] Date DME Agency Contacted: 01/01/22 Time DME Agency Contacted: 1500 Representative spoke with at DME Agency: Thedore Mins HH Arranged: RN, Disease Management, PT [place] Agency: [place] Date [place]: 01/01/22 Time [place]: 1528 Representative spoke with at [place]: [place] ([place]) Interventions     Readmission Risk Interventions Readmission Risk Prevention Plan 01/02/2022 11/26/2019  Transportation Screening Complete Complete  PCP or Specialist Appt within 5-7 Days Complete -  PCP or Specialist Appt within 3-5 Days - Complete  Home Care Screening Complete -  Medication Review (RN CM) Complete -  HRI or [place] - Complete  Social Work Consult for [place] Planning/Counseling - Complete  Palliative Care Screening - Not Applicable  Medication Review Press photographer) - Complete  Some recent data might be hidden

## 2022-01-02 NOTE — Plan of Care (Signed)
°  Problem: Education: Goal: Knowledge of disease or condition will improve Outcome: Adequate for Discharge Goal: Knowledge of the prescribed therapeutic regimen will improve Outcome: Adequate for Discharge Goal: Individualized Educational Video(s) Outcome: Adequate for Discharge   Problem: Activity: Goal: Ability to tolerate increased activity will improve Outcome: Adequate for Discharge Goal: Will verbalize the importance of balancing activity with adequate rest periods Outcome: Adequate for Discharge   Problem: Respiratory: Goal: Ability to maintain a clear airway will improve Outcome: Adequate for Discharge Goal: Levels of oxygenation will improve Outcome: Adequate for Discharge Goal: Ability to maintain adequate ventilation will improve Outcome: Adequate for Discharge   Problem: Activity: Goal: Ability to tolerate increased activity will improve Outcome: Adequate for Discharge   Problem: Clinical Measurements: Goal: Ability to maintain a body temperature in the normal range will improve Outcome: Adequate for Discharge   Problem: Respiratory: Goal: Ability to maintain adequate ventilation will improve Outcome: Adequate for Discharge Goal: Ability to maintain a clear airway will improve Outcome: Adequate for Discharge   Problem: Education: Goal: Knowledge of disease or condition will improve Outcome: Adequate for Discharge Goal: Understanding of medication regimen will improve Outcome: Adequate for Discharge Goal: Individualized Educational Video(s) Outcome: Adequate for Discharge   Problem: Activity: Goal: Ability to tolerate increased activity will improve Outcome: Adequate for Discharge   Problem: Cardiac: Goal: Ability to achieve and maintain adequate cardiopulmonary perfusion will improve Outcome: Adequate for Discharge   Problem: Health Behavior/Discharge Planning: Goal: Ability to safely manage health-related needs after discharge will improve Outcome:  Adequate for Discharge   Problem: Education: Goal: Knowledge of General Education information will improve Description: Including pain rating scale, medication(s)/side effects and non-pharmacologic comfort measures Outcome: Adequate for Discharge   Problem: Health Behavior/Discharge Planning: Goal: Ability to manage health-related needs will improve Outcome: Adequate for Discharge   Problem: Clinical Measurements: Goal: Ability to maintain clinical measurements within normal limits will improve Outcome: Adequate for Discharge Goal: Will remain free from infection Outcome: Adequate for Discharge Goal: Diagnostic test results will improve Outcome: Adequate for Discharge Goal: Respiratory complications will improve Outcome: Adequate for Discharge Goal: Cardiovascular complication will be avoided Outcome: Adequate for Discharge   Problem: Activity: Goal: Risk for activity intolerance will decrease Outcome: Adequate for Discharge   Problem: Nutrition: Goal: Adequate nutrition will be maintained Outcome: Adequate for Discharge   Problem: Coping: Goal: Level of anxiety will decrease Outcome: Adequate for Discharge   Problem: Elimination: Goal: Will not experience complications related to bowel motility Outcome: Adequate for Discharge Goal: Will not experience complications related to urinary retention Outcome: Adequate for Discharge   Problem: Pain Managment: Goal: General experience of comfort will improve Outcome: Adequate for Discharge   Problem: Safety: Goal: Ability to remain free from injury will improve Outcome: Adequate for Discharge   Problem: Skin Integrity: Goal: Risk for impaired skin integrity will decrease Outcome: Adequate for Discharge

## 2022-01-02 NOTE — Progress Notes (Signed)
Occupational Therapy Treatment Patient Details Name: Martha Mora MRN: [id number] DOB: 17-Nov-1946 Today's Date: 01/02/2022   History of present illness Patient is a 76 y/o female who presents on 12/30/21 with dizziness, syncope, SOB. Admitted with sepsis and acute on chronic hypoxemic respiratory failure and AMS likely secondary to PNA. PMH includes CAD, A-fib, COPD, HTN, breast ca in remission, anxiety, MI, osteoporosis   OT comments  Pt progressing towards acute OT goals. Pt finishing sponge bath and getting dressed during session. No physical assist needed. Further education on strategies for managing daily ADL routine including energy conservation techniques. D/c plan remains appropriate.    Recommendations for follow up therapy are one component of a multi-disciplinary discharge planning process, led by the attending physician.  Recommendations may be updated based on patient status, additional functional criteria and insurance authorization.    Follow Up Recommendations  Home health OT    Assistance Recommended at Discharge Intermittent Supervision/Assistance  Patient can return home with the following      Equipment Recommendations  None recommended by OT    Recommendations for Other Services      Precautions / Restrictions Precautions Precautions: Other (comment);Fall Precaution Comments: watch 02 Restrictions Weight Bearing Restrictions: No       Mobility Bed Mobility               General bed mobility comments: pt up in chair    Transfers Overall transfer level: Needs assistance Equipment used: Rolling walker (2 wheels) Transfers: Sit to/from Stand Sit to Stand: Supervision                 Balance Overall balance assessment: Needs assistance Sitting-balance support: Feet supported, No upper extremity supported Sitting balance-Leahy Scale: Good     Standing balance support: During functional activity, No upper extremity supported, Bilateral  upper extremity supported Standing balance-Leahy Scale: Fair Standing balance comment: Requires UE support for dynamic tasks                           ADL either performed or assessed with clinical judgement   ADL                   Upper Body Dressing : Set up;Sitting   Lower Body Dressing: Supervision/safety;Sit to/from stand                 General ADL Comments: Pt finishing sponge bath and getting dressed during session. No physical assist needed. Further education on strategies for managing daily ADL routine including energy conservation.    Extremity/Trunk Assessment Upper Extremity Assessment Upper Extremity Assessment: Overall WFL for tasks assessed   Lower Extremity Assessment Lower Extremity Assessment: Defer to PT evaluation        Vision       Perception     Praxis      Cognition Arousal/Alertness: Awake/alert Behavior During Therapy: WFL for tasks assessed/performed Overall Cognitive Status: Within Functional Limits for tasks assessed                                          Exercises      Shoulder Instructions       General Comments SpO2 90% at rest on 3L. SpO2 87% on 4L during amb.    Pertinent Vitals/ Pain       Pain Assessment Pain Assessment:  No/denies pain  Home Living                                          Prior Functioning/Environment              Frequency  Min 2X/week        Progress Toward Goals  OT Goals(current goals can now be found in the care plan section)  Progress towards OT goals: Progressing toward goals  Acute Rehab OT Goals Patient Stated Goal: "not tire out so easily" OT Goal Formulation: With patient Time For Goal Achievement: 01/14/22 Potential to Achieve Goals: Good ADL Goals Pt Will Perform Grooming: with modified independence;sitting;standing Pt Will Perform Lower Body Dressing: with modified independence;sit to/from stand Pt Will  Perform Tub/Shower Transfer: Tub transfer;with supervision;shower seat;rolling walker Additional ADL Goal #1: Pt will independently verbalize 2 energy conservation strategies to use for ADL managment  at home.  Plan Discharge plan remains appropriate    Co-evaluation                 AM-PAC OT "6 Clicks" Daily Activity     Outcome Measure   Help from another person eating meals?: None Help from another person taking care of personal grooming?: None Help from another person toileting, which includes using toliet, bedpan, or urinal?: A Little Help from another person bathing (including washing, rinsing, drying)?: A Little Help from another person to put on and taking off regular upper body clothing?: None Help from another person to put on and taking off regular lower body clothing?: A Little 6 Click Score: 21    End of Session Equipment Utilized During Treatment: Rolling walker (2 wheels);Oxygen  OT Visit Diagnosis: Unsteadiness on feet (R26.81);Pain   Activity Tolerance Patient tolerated treatment well   Patient Left in chair;with call bell/phone within reach   Nurse Communication          Time: 1302-1310 OT Time Calculation (min): 8 min  Charges: OT General Charges $OT Visit: 1 Visit OT Treatments $Self Care/Home Management : 8-22 mins  Tyrone Schimke, OT Acute Rehabilitation Services Office: [phone]   Hortencia Pilar 01/02/2022, 1:17 PM

## 2022-01-02 NOTE — Progress Notes (Signed)
Pt to be d/c from 4E to home. D/c instructions and medication education provided. Tele and IV removed.  Raelyn Number, RN

## 2022-01-02 NOTE — Progress Notes (Signed)
Family Medicine Teaching Service Daily Progress Note Intern Pager: [phone]  Patient name: Martha Mora Medical record number: [id number] Date of birth: 01-10-1946 Age: 76 y.o. Gender: female  Primary Care Provider: [name], DO Consultants: None  Code Status: Full  Pt Overview and Major Events to Date:  1/12 Admitted  Assessment and Plan:  Martha Mora is a 76 y.o. female presenting with respiratory distress. PMH is significant for history of breast cancer, anxiety, hypertension, CAD s/p 3 stents in 2005, esophageal stricture, GERD, diverticulosis, IBS, OA, OSA, MVR, RLS, mixed urinary incontinence, history of respiratory distress requiring intubation in 2020.  Severe Sepsis- resolved   Acute on Chronic Hypoxic Respiratory Failure 2/2 to CAP + COPD Vitals 113-160/54-72. WBC 15.8 from 24.3.  Has been doing well at 2 L overnight. Pathology smear review in process.  -Follow-up WBC -Lasix at discharge -Prednisone 3/4 -Cefdinir 300 mg twice daily for 4 more days -DuoNeb scheduled twice daily -Ambulate with oxygen -IS -PT/OT  A fib and need for chronic anticoagulation Rates 59-76.  Was transitioned to Xarelto yesterday -Xarelto 20 mg daily -Amiodarone 100 mg daily -Metoprolol 12.5 mg twice daily -Keep K greater than 4 and mag greater than 2  HFpEF On examination appearsto have coarse crackles, LE edema improved. UOP 1.4L -Lasix on discharge  HTN  113-160/54-72. -metoprolol 12.5 mg BID  Chronic conditions remain stable  FEN/GI: Heart Healthy PPx: Xarelto Dispo:Home likely today  Subjective:  Her breathing has greatly improved and was on 2 L saturating well  Objective: Temp:  [97.6 F (36.4 C)-98.9 F (37.2 C)] 97.6 F (36.4 C) (02/15 0440) Pulse Rate:  [70-75] 70 (02/14 2018) Resp:  [16-21] 21 (02/14 2018) BP: (113-160)/(54-72) 160/72 (02/15 0440) SpO2:  [91 %-94 %] 93 % (02/14 2018) FiO2 (%):  [93 %] 93 % (02/14 2018) Physical Exam: General:  NAD, sitting up in chair Cardiovascular: RRR no murmurs rubs or gallops Respiratory: No increased work of breathing, coarse crackles in bases, speaking full sentences Abdomen: Nontender, soft Extremities: Trace lower extremity edema  Laboratory: Recent Labs  Lab 12/31/21 0153 01/01/22 0209 01/02/22 0204  WBC 39.6* 24.3* 15.8*  HGB 13.7 13.6 14.3  HCT 42.3 42.1 43.3  PLT 197 206 233   Recent Labs  Lab 12/30/21 0807 12/30/21 0825 12/31/21 0833 01/01/22 0209 01/02/22 0204  NA 138   < > 139 138 139  K 4.9   < > 4.5 4.0 4.3  CL 103  --  104 102 101  CO2 21*  --  26 24 28   BUN 12  --  30* 35* 29*  CREATININE 0.98  --  1.06* 1.10* 1.01*  CALCIUM 9.1  --  9.3 8.8* 9.1  PROT 6.7  --   --   --   --   BILITOT 1.5*  --   --   --   --   ALKPHOS 71  --   --   --   --   ALT 17  --   --   --   --   AST 39  --   --   --   --   GLUCOSE 163*  --  140* 133* 113*   < > = values in this interval not displayed.   Imaging/Diagnostic Tests:   Gerrit Heck, MD 01/02/2022, 5:13 AM PGY-1, [place] Intern pager: [phone], text pages welcome

## 2022-01-02 NOTE — Discharge Instructions (Signed)
Dear Martha Mora,   Thank you so much for allowing [place] to be part of your care!  You were admitted to [facility] for shortness of breath and confusion. You were given antibiotics and water pills and your breathing got better. Please continue to use your oxygen    POST-HOSPITAL & CARE INSTRUCTIONS Please finish your steroid and antibiotic course Please let PCP/Specialists know of any changes that were made.  Please see medications section of this packet for any medication changes.   DOCTOR'S APPOINTMENT & FOLLOW UP CARE INSTRUCTIONS  Future Appointments  Date Time Provider [place]  01/07/2022 12:15 PM Hayden Pedro, MD [facility] None  10/24/2022 10:00 AM [facility] None  10/24/2022 10:30 AM Alla Feeling, NP [facility] None    RETURN PRECAUTIONS: Having a harder time breathing  Take care and be well!  [facility]  [place], [place] [address] [phone]

## 2022-01-02 NOTE — Discharge Summary (Signed)
[facility] Discharge Summary  Patient name: Martha Mora Medical record number: [id number] Date of birth: [birth year] Age: 76 y.o. Gender: female Date of Admission: 12/30/2021  Date of Discharge: 01/02/2022 Admitting Physician: Gerrit Heck, MD  Primary Care Provider: [name], DO Consultants: None  Indication for Hospitalization: Hypoxia  Discharge Diagnoses/Problem List:  Active Problems:   History of tobacco use   Atrial fibrillation ([place])   CAP (community acquired pneumonia)  Disposition: Home  Discharge Condition: Stable  Discharge Exam:   General: NAD, sitting up in chair Cardiovascular: RRR no murmurs rubs or gallops Respiratory: No increased work of breathing, coarse crackles in bases, speaking full sentences Abdomen: Nontender, soft Extremities: Trace lower extremity edema   Brief Hospital Course:  Martha Mora is a 76 y.o. female with a history of breast cancer, anxiety, hypertension, CAD s/p 3 stents in 2005, esophageal stricture, GERD, diverticulosis, IBS, OA, OSA, MVR, RLS, mixed urinary incontinence, history of respiratory distress requiring intubation in 2020 who presented with severe sepsis secondary to community-acquired pneumonia. Hospital course outlined by problem below:  Severe sepsis Acute on chronic hypoxemic respiratory failure secondary to CAP/COPD Patient was admitted for respiratory distress with increasing oxygen requirement from baseline (2.5 L) and meeting severe sepsis criteria.  Initially required BiPAP in the ED but was able to weaned to nasal cannula within a few hours. CXR with evidence of pneumonia. She was treated with broad spectrum antibiotics which was de-escalated to cefdinir for a 5-day total course prior to discharge. Lasix and duonebs also helped with breathing as well. Did receive steroid course as well. On discharge, she was breathing comfortably on 2L [place]. She was set up for new home  oxygen prior to discharge.   A fib and need for chronic anticoagulation Was rate controlled in the hospital.  Amiodarone was continued as well as metoprolol.  Patient had medication cost issues with Eliquis and had not taken for several months. Xarelto was started in hospital with patient assistance program.  HFpEF Was not on diuretics at home.  Was given Lasix during hospitalization with improvement in breathing.  Did have lower extremity 1+ pitting edema.  As needed Lasix given to patient at discharge  Leukocytosis WBC was up to 39.6 in hospitalization and came down to 15.8 at end of hospitalization.  Likely reactive from illness and steroids.  Pathology smear review was ordered and pending at discharge.  Chronic conditions remained stable  Issues for Follow Up:  Consider adding farxiga outpatient.  Amlodipine held on discharge, restart if needed. Consider weaning trazodone. Anticoagulation switched to rivaroxaban due to inability to afford apixaban. 30-day supply given through [place]. As needed Lasix 20 mg given to patient for fluid status/weight gain CBC and BMP at follow up  Significant Procedures: None  Significant Labs and Imaging:  Recent Labs  Lab 12/31/21 0153 01/01/22 0209 01/02/22 0204  WBC 39.6* 24.3* 15.8*  HGB 13.7 13.6 14.3  HCT 42.3 42.1 43.3  PLT 197 206 233   Recent Labs  Lab 12/30/21 0807 12/30/21 0825 12/31/21 0833 01/01/22 0209 01/02/22 0204  NA 138 139 139 138 139  K 4.9 3.8 4.5 4.0 4.3  CL 103  --  104 102 101  CO2 21*  --  26 24 28   GLUCOSE 163*  --  140* 133* 113*  BUN 12  --  30* 35* 29*  CREATININE 0.98  --  1.06* 1.10* 1.01*  CALCIUM 9.1  --  9.3 8.8* 9.1  MG  --   --  2.3  --   --   ALKPHOS 71  --   --   --   --   AST 39  --   --   --   --   ALT 17  --   --   --   --   ALBUMIN 3.8  --   --   --   --     Results/Tests Pending at Time of Discharge:   Discharge Medications:  Allergies as of 01/02/2022       Reactions    Keflex [cephalexin] Other (See Comments)   "Made me feel funny" Tolerated Rocephin in April 2020 over several days and 07/01/19 in ED.   Penicillins Rash, Other (See Comments)   03/01/19 tolerated Zosyn Red bumps all over stomach Did it involve swelling of the face/tongue/throat, SOB, or low BP? No Did it involve sudden or severe rash/hives, skin peeling, or any reaction on the inside of your mouth or nose? Yes Did you need to seek medical attention at a hospital or doctor's office? Yes When did it last happen?      30+ years  If all above answers are NO, may proceed with cephalosporin use. 03/01/19 tolerated Zosyn Red bumps all over stomach Did it involve swelling of the face/tongue/throat, SOB, or low BP? No Did it involve sudden or severe rash/hives, skin peeling, or any reaction on the inside of your mouth or nose? Yes Did you need to seek medical attention at a hospital or doctor's office? Yes When did it last happen?      30+ years  If all above answers are NO, may proceed with cephalosporin use.   Ropinirole Hcl Other (See Comments)   "Could not stop moving" likely akathisia occurred in 2019   Trazodone And Nefazodone Other (See Comments)   Makes restless leg worse   Shellfish Allergy Nausea And Vomiting        Medication List     STOP taking these medications    amLODipine 5 MG tablet Commonly known as: NORVASC   [employer] Melatonin 3 MG Tabs tablet Generic drug: melatonin   Eliquis 5 MG Tabs tablet Generic drug: apixaban       TAKE these medications    acetaminophen 500 MG tablet Commonly known as: TYLENOL Take 500 mg by mouth 2 (two) times daily.   albuterol 108 (90 Base) MCG/ACT inhaler Commonly known as: VENTOLIN HFA TAKE 1-2 INHALATIONS EVERY 4-6 HOURS AS NEEDED FOR WHEEZING. DISPENSE SPACER AS NEEDED. What changed: See the new instructions.   amiodarone 200 MG tablet Commonly known as: PACERONE Take 1/2 tablet (100 mg total) by mouth daily.    Aspirin Low Dose 81 MG EC tablet Generic drug: aspirin Take 1 tablet (81 mg total) by mouth daily. Swallow whole.   busPIRone 10 MG tablet Commonly known as: BUSPAR TAKE 1 TABLET BY MOUTH THREE TIMES A DAY   Calcium Carb-Cholecalciferol 600-800 MG-UNIT Tabs Take 1 tablet by mouth 2 (two) times a day.   cefdinir 300 MG capsule Commonly known as: OMNICEF Take 1 capsule (300 mg total) by mouth every 12 (twelve) hours.   DULoxetine 60 MG capsule Commonly known as: CYMBALTA TAKE 1 CAPSULE BY MOUTH EVERY DAY What changed:  how much to take when to take this   furosemide 20 MG tablet Commonly known as: Lasix Take 1 tablet (20 mg total) by mouth daily as needed. Take if gains three pounds in one day  or five pounds in one week   ketorolac 0.5 % ophthalmic solution Commonly known as: ACULAR Place 1 drop into both eyes See admin instructions. Four times a day for 2 days following eye injection. Injection every 5 weeks   losartan 100 MG tablet Commonly known as: COZAAR Take 100 mg by mouth daily.   metoprolol tartrate 25 MG tablet Commonly known as: LOPRESSOR Take 0.5 tablets (12.5 mg total) by mouth 2 (two) times daily. What changed:  how much to take when to take this   multivitamin tablet Take 1 tablet by mouth daily. Herbal Life multivitamin   omeprazole 40 MG capsule Commonly known as: PRILOSEC TAKE 1 CAPSULE BY MOUTH  DAILY   predniSONE 20 MG tablet Commonly known as: DELTASONE Take 2 tablets (40 mg total) by mouth daily with breakfast. Start taking on: January 03, 2022   pregabalin 75 MG capsule Commonly known as: LYRICA TAKE 1 CAPSULE BY MOUTH TWICE A DAY   PRESERVISION AREDS 2 PO Take 1 tablet by mouth daily.   Prolia 60 MG/ML Sosy injection Generic drug: denosumab Inject 60 mg into the skin every 6 (six) months.   rosuvastatin 20 MG tablet Commonly known as: CRESTOR Take 20 mg by mouth daily.   traZODone 100 MG tablet Commonly known as:  DESYREL TAKE 1 TABLET BY MOUTH AT BEDTIME AS NEEDED FOR SLEEP   Vitamin D (Ergocalciferol) 1.25 MG (50000 UNIT) Caps capsule Commonly known as: DRISDOL TAKE 1 CAPSULE BY MOUTH ONE TIME PER WEEK What changed: See the new instructions.   Xarelto 20 MG Tabs tablet Generic drug: rivaroxaban Take 1 tablet (20 mg total) by mouth daily with supper.               Durable Medical Equipment  (From admission, onward)           Start     Ordered   01/01/22 1648  For home use only DME oxygen  Once       Comments: POC eval  Question Answer Comment  Length of Need Lifetime   Mode or (Route) Nasal cannula   Liters per Minute 3   Frequency Continuous (stationary and portable oxygen unit needed)   Oxygen delivery system Gas      01/01/22 1647            Discharge Instructions: Please refer to Patient Instructions section of EMR for full details.  Patient was counseled important signs and symptoms that should prompt return to medical care, changes in medications, dietary instructions, activity restrictions, and follow up appointments.   Follow-Up Appointments:  Follow-up Information     [name], DO Follow up on 01/09/2022.   Specialty: Family Medicine Contact information: [address] [address] [place], [place] Follow up.   Specialty: Home Health Services Why: HHRN/PT arranged- they will contact you to schedule initial visit for start of care within 48hr post discharge Contact information: [place] [address] [phone]         [facility], [facility] Follow up.   Why: ([facility])- Home 02 arranged- portable tank and concentrator delivered to room for transport home, they will assess post discharge for smaller tanks. Contact information: [place] [address] [phone]                 Gerrit Heck, MD 01/02/2022, 4:46 PM PGY-1, [place]

## 2022-01-02 NOTE — Progress Notes (Signed)
Physical Therapy Treatment Patient Details Name: Martha Mora MRN: [id number] DOB: Apr 04, 1946 Today's Date: 01/02/2022   History of Present Illness Patient is a 76 y/o female who presents on 12/30/21 with dizziness, syncope, SOB. Admitted with sepsis and acute on chronic hypoxemic respiratory failure and AMS likely secondary to PNA. PMH includes CAD, A-fib, COPD, HTN, breast ca in remission, anxiety, MI, osteoporosis    PT Comments    Pt required supervision transfers, and min guard assist ambulation 175' with RW. Gait distance limited by 2-3/4 DOE. On arrival, pt on 2L O2. Required 4L with amb for 87% SpO2. Only able to tolerate decrease to 3L at rest after session for SpO2 90%. Pt educated on energy conservation techniques. Discharge recommendations updated to HHPT.    Recommendations for follow up therapy are one component of a multi-disciplinary discharge planning process, led by the attending physician.  Recommendations may be updated based on patient status, additional functional criteria and insurance authorization.  Follow Up Recommendations  Home health PT     Assistance Recommended at Discharge Intermittent Supervision/Assistance  Patient can return home with the following Assistance with cooking/housework;Help with stairs or ramp for entrance;A little help with walking and/or transfers   Equipment Recommendations  None recommended by PT    Recommendations for Other Services       Precautions / Restrictions Precautions Precautions: Other (comment);Fall Precaution Comments: watch 02     Mobility  Bed Mobility               General bed mobility comments: Pt up in recliner.    Transfers Overall transfer level: Needs assistance Equipment used: Rolling walker (2 wheels) Transfers: Sit to/from Stand Sit to Stand: Supervision                Ambulation/Gait Ambulation/Gait assistance: Min guard Gait Distance (Feet): 175 Feet Assistive device:  Rolling walker (2 wheels) Gait Pattern/deviations: Step-through pattern, Decreased stride length Gait velocity: WFL Gait velocity interpretation: >2.62 ft/sec, indicative of community ambulatory   General Gait Details: Ambulated on 2L with desat to 84% requiring increase to 4L for 87%. Standing rest break midway. Cues for pursed lip breathing. 2-3/4 DOE. Having to limited conversation for return amb to room.   Stairs             Wheelchair Mobility    Modified Rankin (Stroke Patients Only)       Balance Overall balance assessment: Needs assistance Sitting-balance support: Feet supported, No upper extremity supported Sitting balance-Leahy Scale: Good     Standing balance support: During functional activity, No upper extremity supported, Bilateral upper extremity supported Standing balance-Leahy Scale: Fair Standing balance comment: Requires UE support for dynamic tasks                            Cognition Arousal/Alertness: Awake/alert Behavior During Therapy: WFL for tasks assessed/performed Overall Cognitive Status: Within Functional Limits for tasks assessed                                          Exercises      General Comments General comments (skin integrity, edema, etc.): SpO2 90% at rest on 3L. SpO2 87% on 4L during amb.      Pertinent Vitals/Pain Pain Assessment Pain Assessment: No/denies pain    Home Living  Prior Function            PT Goals (current goals can now be found in the care plan section) Acute Rehab PT Goals Patient Stated Goal: home Progress towards PT goals: Progressing toward goals    Frequency    Min 3X/week      PT Plan Discharge plan needs to be updated    Co-evaluation              AM-PAC PT "6 Clicks" Mobility   Outcome Measure  Help needed turning from your back to your side while in a flat bed without using bedrails?: None Help needed  moving from lying on your back to sitting on the side of a flat bed without using bedrails?: A Little Help needed moving to and from a bed to a chair (including a wheelchair)?: A Little Help needed standing up from a chair using your arms (e.g., wheelchair or bedside chair)?: A Little Help needed to walk in hospital room?: A Little Help needed climbing 3-5 steps with a railing? : A Little 6 Click Score: 19    End of Session Equipment Utilized During Treatment: Oxygen;Gait belt Activity Tolerance: Patient tolerated treatment well Patient left: in chair;with call bell/phone within reach Nurse Communication: Mobility status PT Visit Diagnosis: Difficulty in walking, not elsewhere classified (R26.2);Other abnormalities of gait and mobility (R26.89)     Time: 1572-6203 PT Time Calculation (min) (ACUTE ONLY): 25 min  Charges:  $Gait Training: 23-37 mins                     Lorrin Goodell, [place]  Office # [phone] Pager [phone]    Lorriane Shire 01/02/2022, 10:09 AM

## 2022-01-02 NOTE — Care Management Important Message (Signed)
Important Message  Patient Details  Name: Chrisa Hassan MRN: [id number] Date of Birth: May 23, 1946   Medicare Important Message Given:  Yes     Jameel[place], [place] 01/02/2022, 9:51 AM

## 2022-01-03 DIAGNOSIS — J449 Chronic obstructive pulmonary disease, unspecified: Secondary | ICD-10-CM | POA: Diagnosis not present

## 2022-01-03 DIAGNOSIS — C44622 Squamous cell carcinoma of skin of right upper limb, including shoulder: Secondary | ICD-10-CM | POA: Diagnosis not present

## 2022-01-03 DIAGNOSIS — I4891 Unspecified atrial fibrillation: Secondary | ICD-10-CM | POA: Diagnosis not present

## 2022-01-03 DIAGNOSIS — L570 Actinic keratosis: Secondary | ICD-10-CM | POA: Diagnosis not present

## 2022-01-03 DIAGNOSIS — M5416 Radiculopathy, lumbar region: Secondary | ICD-10-CM | POA: Diagnosis not present

## 2022-01-04 DIAGNOSIS — R531 Weakness: Secondary | ICD-10-CM | POA: Diagnosis not present

## 2022-01-04 DIAGNOSIS — J449 Chronic obstructive pulmonary disease, unspecified: Secondary | ICD-10-CM | POA: Diagnosis not present

## 2022-01-04 DIAGNOSIS — R0902 Hypoxemia: Secondary | ICD-10-CM | POA: Diagnosis not present

## 2022-01-04 DIAGNOSIS — R0600 Dyspnea, unspecified: Secondary | ICD-10-CM | POA: Diagnosis not present

## 2022-01-04 LAB — CULTURE, BLOOD (ROUTINE X 2)
Culture: NO GROWTH
Culture: NO GROWTH
Special Requests: ADEQUATE

## 2022-01-07 ENCOUNTER — Other Ambulatory Visit: Payer: Self-pay

## 2022-01-07 ENCOUNTER — Encounter (INDEPENDENT_AMBULATORY_CARE_PROVIDER_SITE_OTHER): Payer: Medicare Other | Admitting: Ophthalmology

## 2022-01-07 DIAGNOSIS — I10 Essential (primary) hypertension: Secondary | ICD-10-CM

## 2022-01-07 DIAGNOSIS — H43813 Vitreous degeneration, bilateral: Secondary | ICD-10-CM | POA: Diagnosis not present

## 2022-01-07 DIAGNOSIS — H35033 Hypertensive retinopathy, bilateral: Secondary | ICD-10-CM

## 2022-01-07 DIAGNOSIS — H353231 Exudative age-related macular degeneration, bilateral, with active choroidal neovascularization: Secondary | ICD-10-CM

## 2022-01-07 DIAGNOSIS — H33302 Unspecified retinal break, left eye: Secondary | ICD-10-CM | POA: Diagnosis not present

## 2022-01-08 ENCOUNTER — Other Ambulatory Visit (HOSPITAL_COMMUNITY): Payer: Self-pay

## 2022-01-08 ENCOUNTER — Telehealth: Payer: Self-pay | Admitting: *Deleted

## 2022-01-08 ENCOUNTER — Telehealth (HOSPITAL_COMMUNITY): Payer: Self-pay

## 2022-01-08 DIAGNOSIS — M5416 Radiculopathy, lumbar region: Secondary | ICD-10-CM | POA: Diagnosis not present

## 2022-01-08 DIAGNOSIS — I4891 Unspecified atrial fibrillation: Secondary | ICD-10-CM | POA: Diagnosis not present

## 2022-01-08 DIAGNOSIS — L570 Actinic keratosis: Secondary | ICD-10-CM | POA: Diagnosis not present

## 2022-01-08 DIAGNOSIS — J449 Chronic obstructive pulmonary disease, unspecified: Secondary | ICD-10-CM | POA: Diagnosis not present

## 2022-01-08 DIAGNOSIS — C44622 Squamous cell carcinoma of skin of right upper limb, including shoulder: Secondary | ICD-10-CM | POA: Diagnosis not present

## 2022-01-08 NOTE — Telephone Encounter (Signed)
Transitions of Care Pharmacy  ° °Call attempted for a pharmacy transitions of care follow-up. HIPAA appropriate voicemail was left with call back information provided.  ° °Call attempt #1. Will follow-up in 2-3 days.  °  °

## 2022-01-08 NOTE — Chronic Care Management (AMB) (Signed)
°  Care Management   Outreach Note  01/08/2022 Name: Martha Mora MRN: [id number] DOB: 05-09-1946  Referred by: [name], DO Reason for referral : Care Coordination (Initial outreach to schedule referral with [facility] )   An unsuccessful telephone outreach was attempted today. The patient was referred to the case management team for assistance with care management and care coordination.   Follow Up Plan:  A HIPAA compliant phone message was left for the patient providing contact information and requesting a return call.  The care management team will reach out to the patient again over the next 7 days. If patient returns call to provider office, please advise to call [place] at [phone].  [place] Management  Direct Dial: [phone]

## 2022-01-08 NOTE — Chronic Care Management (AMB) (Signed)
°  Care Management   Note  01/08/2022 Name: Martha Mora MRN: [id number] DOB: June 29, 1946  Martha Mora is a 76 y.o. year old female who is a primary care patient of [name], DO. I reached out to [name] by phone today in response to a referral sent by Martha Mora's primary care provider.   Martha Mora was given information about care management services today including:  Care management services include personalized support from designated clinical staff supervised by her physician, including individualized plan of care and coordination with other care providers 24/7 contact phone numbers for assistance for urgent and routine care needs. The patient may stop care management services at any time by phone call to the office staff.  Patient agreed to services and verbal consent obtained.   Follow up plan: Telephone appointment with care management team member scheduled for:01/21/22  [place] Management  Direct Dial: [phone]

## 2022-01-09 ENCOUNTER — Other Ambulatory Visit: Payer: Self-pay

## 2022-01-09 ENCOUNTER — Ambulatory Visit (INDEPENDENT_AMBULATORY_CARE_PROVIDER_SITE_OTHER): Payer: Medicare Other | Admitting: Family Medicine

## 2022-01-09 ENCOUNTER — Encounter: Payer: Self-pay | Admitting: Family Medicine

## 2022-01-09 VITALS — BP 152/60 | HR 71 | Wt 232.8 lb

## 2022-01-09 DIAGNOSIS — I4891 Unspecified atrial fibrillation: Secondary | ICD-10-CM

## 2022-01-09 DIAGNOSIS — I10 Essential (primary) hypertension: Secondary | ICD-10-CM | POA: Diagnosis not present

## 2022-01-09 DIAGNOSIS — D72829 Elevated white blood cell count, unspecified: Secondary | ICD-10-CM

## 2022-01-09 DIAGNOSIS — I5032 Chronic diastolic (congestive) heart failure: Secondary | ICD-10-CM

## 2022-01-09 DIAGNOSIS — J9611 Chronic respiratory failure with hypoxia: Secondary | ICD-10-CM

## 2022-01-09 DIAGNOSIS — J449 Chronic obstructive pulmonary disease, unspecified: Secondary | ICD-10-CM

## 2022-01-09 DIAGNOSIS — F419 Anxiety disorder, unspecified: Secondary | ICD-10-CM

## 2022-01-09 MED ORDER — BUSPIRONE HCL 15 MG PO TABS: 15.0000 mg | ORAL_TABLET | Freq: Three times a day (TID) | ORAL | 0 refills | Status: DC

## 2022-01-09 NOTE — Patient Instructions (Signed)
Medication changes:  - Restart your amlodipine as your BP was elevated today.   - Increased Buspar to 15 mg TID for your anxiety.It was great seeing you today!  Please check-out at the front desk before leaving the clinic. I'd like to see you back in 3 months but if you need to be seen earlier than that for any new issues we're happy to fit you in, just give [place] a call!  Visit Remembers: - Stop by the pharmacy to pick up your prescriptions  - Continue to work on your healthy eating habits and incorporating exercise into your daily life.  - Your goal is to have an BP < 120/80  Please bring all of your medications with you to each visit.   Feel free to call with any questions or concerns at any time, at [phone].   Take care,  Dr. Rushie Chestnut [facility]

## 2022-01-09 NOTE — Progress Notes (Signed)
° °  SUBJECTIVE:   CHIEF COMPLAINT / HPI:    Martha Mora is a 76 y.o. female here for hospital follow up.  She was admitted for acute respiratory failure 2/2 CAP.    AFIB Switched to Xarelto.  Has had mild nosebleed but is believes this to be due to her consistent use of oxygen.  Previously on Eliquis for  HTN Held amlodipine upon discharge.  HFpEF  Given PRN Lasix with improvement in the hospital.  She not been getting this medication.  No lower extremity edema.    COPD Continues to be short of breath but only with exertion.  Uses 2.5 to 3 L oxygen now consistently throughout the day.  Notices it is breath most when moving from the bathroom to her room.  She would like to get a smaller oxygen tank as the one she has currently is very large and difficult to move around.  Anxiety Her anxiety has been worse since getting out of the hospital.  Notes that she gets more and more anxious.  Additionally, she is [place] to the [place] to her son's home.  She is worrying that she will have to go back to the hospital.  PERTINENT  PMH / PSH: reviewed and updated as appropriate   OBJECTIVE:   BP (!) 152/60    Pulse 71    Wt 232 lb 12.8 oz (105.6 kg)    SpO2 96%    BMI 43.99 kg/m    GEN: pleasant elderly female, in no acute distress  CV: regular rate and rhythm, no JVP RESP: no increased work of breathing, clear to ascultation bilaterally, wearing nasal cannula MSK: no edema, SKIN: warm, dry  ASSESSMENT/PLAN:    Hospital follow up:  Essential hypertension BP not at goal.  Amlodipine was held at discharge.  Restart amlodipine milligrams daily.  Atrial fibrillation [facility]) Patient switched from Eliquis to Xarelto due to cost.  Follows with Dr. [name], cardiologist.  Taking amiodarone 100 mg daily.    Chronic respiratory failure (HCC) Has known COPD.  Continue 2 to 3 L nasal cannula.  Request for many portable oxygen tank versus concentrator will help prevent falls as patient  is on chronic anticoagulation.  DME order placed.  Chronic heart failure with preserved ejection fraction ([place]) Follows with cardiology.  Started on as needed Lasix during her admission.  She has not used this medication as of yet.  BMP today.  Advised to follow-up with cardiology.  Consider starting Farxiga.  Anxiety Increase buspirone 15 mg 3 times daily.  Continue duloxetine.  For a while she was stable on Seroquel but this caused restless leg syndrome.  This medicine was discontinued by Dr. Nancy Fetter last month.  Leukocytosis Repeat CBC today.    [name], DO PGY-3, [facility] 01/10/2022

## 2022-01-10 ENCOUNTER — Telehealth (HOSPITAL_COMMUNITY): Payer: Self-pay | Admitting: Pharmacist

## 2022-01-10 ENCOUNTER — Other Ambulatory Visit (HOSPITAL_COMMUNITY): Payer: Self-pay

## 2022-01-10 DIAGNOSIS — I4891 Unspecified atrial fibrillation: Secondary | ICD-10-CM | POA: Diagnosis not present

## 2022-01-10 DIAGNOSIS — M5416 Radiculopathy, lumbar region: Secondary | ICD-10-CM | POA: Diagnosis not present

## 2022-01-10 DIAGNOSIS — I5032 Chronic diastolic (congestive) heart failure: Secondary | ICD-10-CM | POA: Insufficient documentation

## 2022-01-10 DIAGNOSIS — J449 Chronic obstructive pulmonary disease, unspecified: Secondary | ICD-10-CM | POA: Diagnosis not present

## 2022-01-10 DIAGNOSIS — L570 Actinic keratosis: Secondary | ICD-10-CM | POA: Diagnosis not present

## 2022-01-10 DIAGNOSIS — C44622 Squamous cell carcinoma of skin of right upper limb, including shoulder: Secondary | ICD-10-CM | POA: Diagnosis not present

## 2022-01-10 LAB — BASIC METABOLIC PANEL
BUN/Creatinine Ratio: 18 (ref 12–28)
BUN: 14 mg/dL (ref 8–27)
CO2: 23 mmol/L (ref 20–29)
Calcium: 9.6 mg/dL (ref 8.7–10.3)
Chloride: 105 mmol/L (ref 96–106)
Creatinine, Ser: 0.77 mg/dL (ref 0.57–1.00)
Glucose: 104 mg/dL — ABNORMAL HIGH (ref 70–99)
Potassium: 4.5 mmol/L (ref 3.5–5.2)
Sodium: 142 mmol/L (ref 134–144)
eGFR: 79 mL/min/{1.73_m2} (ref >59)

## 2022-01-10 LAB — CBC WITH DIFFERENTIAL/PLATELET
Basophils Absolute: 0.1 10*3/uL (ref 0.0–0.2)
Basos: 1 %
EOS (ABSOLUTE): 0.6 10*3/uL — ABNORMAL HIGH (ref 0.0–0.4)
Eos: 4 %
Hematocrit: 42.1 % (ref 34.0–46.6)
Hemoglobin: 13.7 g/dL (ref 11.1–15.9)
Immature Grans (Abs): 0.1 10*3/uL (ref 0.0–0.1)
Immature Granulocytes: 1 %
Lymphocytes Absolute: 2.6 10*3/uL (ref 0.7–3.1)
Lymphs: 19 %
MCH: 30.2 pg (ref 26.6–33.0)
MCHC: 32.5 g/dL (ref 31.5–35.7)
MCV: 93 fL (ref 79–97)
Monocytes Absolute: 1 10*3/uL — ABNORMAL HIGH (ref 0.1–0.9)
Monocytes: 7 %
Neutrophils Absolute: 9 10*3/uL — ABNORMAL HIGH (ref 1.4–7.0)
Neutrophils: 68 %
Platelets: 174 10*3/uL (ref 150–450)
RBC: 4.53 x10E6/uL (ref 3.77–5.28)
RDW: 13.7 % (ref 11.7–15.4)
WBC: 13.2 10*3/uL — ABNORMAL HIGH (ref 3.4–10.8)

## 2022-01-10 NOTE — Assessment & Plan Note (Addendum)
Patient switched from Eliquis to Xarelto due to cost.  Follows with Dr. [name], cardiologist.  Taking amiodarone 100 mg daily.

## 2022-01-10 NOTE — Assessment & Plan Note (Addendum)
Follows with cardiology.  Started on as needed Lasix during her admission.  She has not used this medication as of yet.  BMP today.  Advised to follow-up with cardiology.  Consider starting Farxiga.

## 2022-01-10 NOTE — Assessment & Plan Note (Signed)
BP not at goal.  Amlodipine was held at discharge.  Restart amlodipine milligrams daily.

## 2022-01-10 NOTE — Telephone Encounter (Signed)
LVM, second attempt.

## 2022-01-10 NOTE — Assessment & Plan Note (Addendum)
Has known COPD.  Continue 2 to 3 L nasal cannula.  Request for many portable oxygen tank versus concentrator will help prevent falls as patient is on chronic anticoagulation.  DME order placed.

## 2022-01-10 NOTE — Assessment & Plan Note (Signed)
Increase buspirone 15 mg 3 times daily.  Continue duloxetine.  For a while she was stable on Seroquel but this caused restless leg syndrome.  This medicine was discontinued by Dr. Nancy Fetter last month.

## 2022-01-11 ENCOUNTER — Telehealth (HOSPITAL_COMMUNITY): Payer: Self-pay

## 2022-01-11 ENCOUNTER — Telehealth: Payer: Self-pay

## 2022-01-11 NOTE — Telephone Encounter (Signed)
Transitions of Care Pharmacy   Call attempted for a pharmacy transitions of care follow-up. HIPAA appropriate voicemail was left with call back information provided.   Call attempt #3. Will no longer attempt to reach patient.

## 2022-01-11 NOTE — Telephone Encounter (Signed)
[facility] PT calls nurse line requesting verbal orders for [facility] PT as follows.   1x a week for 1 week 2x a week for 4 weeks  1x a week for 4 weeks   Verbal order given per [facility] protocol.

## 2022-01-15 DIAGNOSIS — I4891 Unspecified atrial fibrillation: Secondary | ICD-10-CM | POA: Diagnosis not present

## 2022-01-15 DIAGNOSIS — M5416 Radiculopathy, lumbar region: Secondary | ICD-10-CM | POA: Diagnosis not present

## 2022-01-15 DIAGNOSIS — C44622 Squamous cell carcinoma of skin of right upper limb, including shoulder: Secondary | ICD-10-CM | POA: Diagnosis not present

## 2022-01-15 DIAGNOSIS — L570 Actinic keratosis: Secondary | ICD-10-CM | POA: Diagnosis not present

## 2022-01-15 DIAGNOSIS — J449 Chronic obstructive pulmonary disease, unspecified: Secondary | ICD-10-CM | POA: Diagnosis not present

## 2022-01-18 DIAGNOSIS — I5030 Unspecified diastolic (congestive) heart failure: Secondary | ICD-10-CM | POA: Diagnosis not present

## 2022-01-18 DIAGNOSIS — A419 Sepsis, unspecified organism: Secondary | ICD-10-CM | POA: Diagnosis not present

## 2022-01-18 DIAGNOSIS — I110 Hypertensive heart disease with heart failure: Secondary | ICD-10-CM | POA: Diagnosis not present

## 2022-01-18 DIAGNOSIS — J440 Chronic obstructive pulmonary disease with acute lower respiratory infection: Secondary | ICD-10-CM | POA: Diagnosis not present

## 2022-01-18 DIAGNOSIS — J9621 Acute and chronic respiratory failure with hypoxia: Secondary | ICD-10-CM | POA: Diagnosis not present

## 2022-01-19 ENCOUNTER — Other Ambulatory Visit: Payer: Self-pay | Admitting: Family Medicine

## 2022-01-21 ENCOUNTER — Ambulatory Visit: Payer: Medicare Other

## 2022-01-22 ENCOUNTER — Encounter: Payer: Self-pay | Admitting: Family Medicine

## 2022-01-22 NOTE — Patient Instructions (Signed)
?  Martha Mora  it was nice speaking with you. Please call me directly [phone] if you have questions about the goals we discussed. ? ? ? ?Patient Care Plan: RN Case Manager  ?  ? ?Problem Identified: Chronic Care needs in a patient with COPD   ?  ? ?Goal: Quality of Life Maintained by using portable Oxygen   ?Start Date: 01/21/2022  ?Expected End Date: 03/15/2022  ?Priority: High  ?Note:   ? ?Care Coordination needs related to needed DME/Supplies in a patient with COPD ? ?Nurse Case Manager Clinical Goal(s):  ?Over the next 14 days, patient will verbalize understanding of plan to obtain needed DME.  ?Over the next 21 days, patient will work with care guide and/or [facility]  to address needs related to DME needs. ? ?Interventions:  ?Inter-disciplinary care team collaboration (see longitudinal plan of care) ?Collaboration with/confirmation of receipt of DME orders at [facility]  ?Evaluation of current treatment plan related to primary care and patient's adherence to plan as established by provider. ?Reviewed medications with patient and discussed of taking medication-  ?Discussed plans with patient for ongoing care management follow up and provided patient with direct contact information for care management team ?Explain to the patient that her information will be sent to [facility] for the portable oxygen  ?CM RN was sent a referral for the patient who was in the hospital for [place] on 12/30/21-01/02/22. She was discharged with oxygen and a concentrator from [facility]. She is currently on 2.5 to 3 liters of oxygen. She has to go to appointments and other places, and when she travels, she uses a walker, which is very cumbersome for her use of the walker and the tank she has. She would like to have a smaller, more portable tank. I will send a message to [facility] about her obtaining a smaller tank. ? ?Patient Goals/Self Care Activities: ?-Patient/Caregiver will self-administer medications as prescribed as evidenced by self-report/primary  caregiver report  ?-Patient/Caregiver will attend all scheduled provider appointments as evidenced by clinician review of documented attendance to scheduled appointments and patient/caregiver report ?-Patient/Caregiver will call pharmacy for medication refills as evidenced by patient report and review of pharmacy fill history as appropriate ?-Patient/Caregiver will call provider office for new concerns or questions as evidenced by review of documented incoming telephone call notes and patient report ?-Patient/Caregiver verbalizes understanding of plan ?-Patient/Caregiver will focus on medication adherence by taking medications as prescribed ?-Avoid smoke and air pollution ?-Use a humidifier, if needed ?-Practice and use pursed lip breathing for shortness of breath recovery and prevention ?-Self assess COPD action plan zone and make appointment with provider if you have been in the yellow zone for 48 hours without improvement. ?-Utilize infection prevention strategies to reduce risk of respiratory infection  ?  ?  ? ?Martha Mora received Care Management services today:  ?Care Management services include personalized support from designated clinical staff supervised by her physician, including individualized plan of care and coordination with other care providers ?24/7 contact [phone] for assistance for urgent and routine care needs. ?Care Management are voluntary services and be declined at any time by calling the office. ? ?The patient will review AVS in My Chart   ? ?Follow Up Plan: Patient would like continued follow-up.  CCM RNCM will outreach the patient within the next 2 weeks.  Patient will call office if needed prior to next encounter ? ? ?Lazaro Arms, RN  ? ?[phone]  ?

## 2022-01-22 NOTE — Chronic Care Management (AMB) (Signed)
? Care Management ?  ? RN Visit Note ? ?01/22/2022 ?Name: Martha Mora MRN: [id number] DOB: August 23, 1946 ? ?Subjective: ?Martha Mora is a 76 y.o. year old female who is a primary care patient of Martha Hensen, DO. The care management team was consulted for assistance with disease management and care coordination needs.   ? ?Engaged with patient by telephone for follow up visit in response to provider referral for case management and/or care coordination services.  ? ?Consent to Services:  ? Martha Mora was given information about Care Management services today including:  ?Care Management services includes personalized support from designated clinical staff supervised by her physician, including individualized plan of care and coordination with other care providers ?24/7 contact phone numbers for assistance for urgent and routine care needs. ?The patient may stop case management services at any time by phone call to the office staff. ? ?Patient agreed to services and consent obtained.  ? ?Summary: The patient is making significant efforts toward positive changes in her chronic conditions.She is trying to get a smaller tank and more portable oxygen that   . See Care Plan below for interventions and patient self-care actives. ? ?Recommendation: The patient may benefit from Taking her medications using her oxygen and monitoring for communication from [facility] ? ?Follow up Plan: Patient would like continued follow-up.  CCM RNCM will outreach the patient within the next 2 weeks  Patient will call office if needed prior to next encounter ? ? ?Assessment: Review of patient past medical history, allergies, medications, health status, including review of consultants reports, laboratory and other test data, was performed as part of comprehensive evaluation and provision of chronic care management services.  ? ?SDOH (Social Determinants of Health) assessments and interventions performed:  No ? ?Care Plan ? ? ? ?Conditions  to be addressed/monitored:  COPD ? ?Care Plan : RN Case Manager  ?Updates made by Martha Arms, RN since 01/22/2022 12:00 AM  ?  ? ?Problem: Chronic Care needs in a patient with COPD   ?  ? ?Goal: Quality of Life Maintained by using portable Oxygen   ?Start Date: 01/21/2022  ?Expected End Date: 03/15/2022  ?Priority: High  ?Note:   ? ?Care Coordination needs related to needed DME/Supplies in a patient with COPD ? ?Nurse Case Manager Clinical Goal(s):  ?Over the next 14 days, patient will verbalize understanding of plan to obtain needed DME.  ?Over the next 21 days, patient will work with care guide and/or [facility]  to address needs related to DME needs. ? ?Interventions:  ?Inter-disciplinary care team collaboration (see longitudinal plan of care) ?Collaboration with/confirmation of receipt of DME orders at [facility]  ?Evaluation of current treatment plan related to primary care and patient's adherence to plan as established by provider. ?Reviewed medications with patient and discussed of taking medication-  ?Discussed plans with patient for ongoing care management follow up and provided patient with direct contact information for care management team ?Explain to the patient that her information will be sent to [facility] for the portable oxygen  ?CM RN was sent a referral for the patient who was in the hospital for [place] on 12/30/21-01/02/22. She was discharged with oxygen and a concentrator from [facility]. She is currently on 2.5 to 3 liters of oxygen. She has to go to appointments and other places, and when she travels, she uses a walker, which is very cumbersome for her use of the walker and the tank she has. She would like to have a smaller,  more portable tank. I will send a message to [facility] about her obtaining a smaller tank. ? ?Patient Goals/Self Care Activities: ?-Patient/Caregiver will self-administer medications as prescribed as evidenced by self-report/primary caregiver report  ?-Patient/Caregiver will attend all scheduled  provider appointments as evidenced by clinician review of documented attendance to scheduled appointments and patient/caregiver report ?-Patient/Caregiver will call pharmacy for medication refills as evidenced by patient report and review of pharmacy fill history as appropriate ?-Patient/Caregiver will call provider office for new concerns or questions as evidenced by review of documented incoming telephone call notes and patient report ?-Patient/Caregiver verbalizes understanding of plan ?-Patient/Caregiver will focus on medication adherence by taking medications as prescribed ?-Avoid smoke and air pollution ?-Use a humidifier, if needed ?-Practice and use pursed lip breathing for shortness of breath recovery and prevention ?-Self assess COPD action plan zone and make appointment with provider if you have been in the yellow zone for 48 hours without improvement. ?-Utilize infection prevention strategies to reduce risk of respiratory infection  ?  ? ?Martha Arms RN, BSN, [place] ?Care Management Coordinator ?[place]  ?Phone: [phone]  ?  ? ? ? ? ? ? ? ? ? ?

## 2022-01-23 DIAGNOSIS — I110 Hypertensive heart disease with heart failure: Secondary | ICD-10-CM | POA: Diagnosis not present

## 2022-01-23 DIAGNOSIS — J440 Chronic obstructive pulmonary disease with acute lower respiratory infection: Secondary | ICD-10-CM | POA: Diagnosis not present

## 2022-01-23 DIAGNOSIS — J9621 Acute and chronic respiratory failure with hypoxia: Secondary | ICD-10-CM | POA: Diagnosis not present

## 2022-01-23 DIAGNOSIS — I5030 Unspecified diastolic (congestive) heart failure: Secondary | ICD-10-CM | POA: Diagnosis not present

## 2022-01-23 DIAGNOSIS — A419 Sepsis, unspecified organism: Secondary | ICD-10-CM | POA: Diagnosis not present

## 2022-01-24 DIAGNOSIS — A419 Sepsis, unspecified organism: Secondary | ICD-10-CM | POA: Diagnosis not present

## 2022-01-24 DIAGNOSIS — I5030 Unspecified diastolic (congestive) heart failure: Secondary | ICD-10-CM | POA: Diagnosis not present

## 2022-01-24 DIAGNOSIS — J9621 Acute and chronic respiratory failure with hypoxia: Secondary | ICD-10-CM | POA: Diagnosis not present

## 2022-01-24 DIAGNOSIS — I110 Hypertensive heart disease with heart failure: Secondary | ICD-10-CM | POA: Diagnosis not present

## 2022-01-24 DIAGNOSIS — J440 Chronic obstructive pulmonary disease with acute lower respiratory infection: Secondary | ICD-10-CM | POA: Diagnosis not present

## 2022-01-25 DIAGNOSIS — I110 Hypertensive heart disease with heart failure: Secondary | ICD-10-CM | POA: Diagnosis not present

## 2022-01-25 DIAGNOSIS — J440 Chronic obstructive pulmonary disease with acute lower respiratory infection: Secondary | ICD-10-CM | POA: Diagnosis not present

## 2022-01-25 DIAGNOSIS — I5030 Unspecified diastolic (congestive) heart failure: Secondary | ICD-10-CM | POA: Diagnosis not present

## 2022-01-25 DIAGNOSIS — A419 Sepsis, unspecified organism: Secondary | ICD-10-CM | POA: Diagnosis not present

## 2022-01-25 DIAGNOSIS — J9621 Acute and chronic respiratory failure with hypoxia: Secondary | ICD-10-CM | POA: Diagnosis not present

## 2022-01-28 DIAGNOSIS — J440 Chronic obstructive pulmonary disease with acute lower respiratory infection: Secondary | ICD-10-CM | POA: Diagnosis not present

## 2022-01-28 DIAGNOSIS — I110 Hypertensive heart disease with heart failure: Secondary | ICD-10-CM | POA: Diagnosis not present

## 2022-01-28 DIAGNOSIS — I5030 Unspecified diastolic (congestive) heart failure: Secondary | ICD-10-CM | POA: Diagnosis not present

## 2022-01-28 DIAGNOSIS — A419 Sepsis, unspecified organism: Secondary | ICD-10-CM | POA: Diagnosis not present

## 2022-01-28 DIAGNOSIS — J9621 Acute and chronic respiratory failure with hypoxia: Secondary | ICD-10-CM | POA: Diagnosis not present

## 2022-01-29 DIAGNOSIS — M7061 Trochanteric bursitis, right hip: Secondary | ICD-10-CM | POA: Diagnosis not present

## 2022-01-29 DIAGNOSIS — M47816 Spondylosis without myelopathy or radiculopathy, lumbar region: Secondary | ICD-10-CM | POA: Diagnosis not present

## 2022-01-30 DIAGNOSIS — R0902 Hypoxemia: Secondary | ICD-10-CM | POA: Diagnosis not present

## 2022-01-30 DIAGNOSIS — R531 Weakness: Secondary | ICD-10-CM | POA: Diagnosis not present

## 2022-01-30 DIAGNOSIS — R0600 Dyspnea, unspecified: Secondary | ICD-10-CM | POA: Diagnosis not present

## 2022-01-30 DIAGNOSIS — J449 Chronic obstructive pulmonary disease, unspecified: Secondary | ICD-10-CM | POA: Diagnosis not present

## 2022-01-31 DIAGNOSIS — J440 Chronic obstructive pulmonary disease with acute lower respiratory infection: Secondary | ICD-10-CM | POA: Diagnosis not present

## 2022-01-31 DIAGNOSIS — A419 Sepsis, unspecified organism: Secondary | ICD-10-CM | POA: Diagnosis not present

## 2022-01-31 DIAGNOSIS — I5030 Unspecified diastolic (congestive) heart failure: Secondary | ICD-10-CM | POA: Diagnosis not present

## 2022-01-31 DIAGNOSIS — J9621 Acute and chronic respiratory failure with hypoxia: Secondary | ICD-10-CM | POA: Diagnosis not present

## 2022-01-31 DIAGNOSIS — I110 Hypertensive heart disease with heart failure: Secondary | ICD-10-CM | POA: Diagnosis not present

## 2022-02-01 DIAGNOSIS — I5030 Unspecified diastolic (congestive) heart failure: Secondary | ICD-10-CM | POA: Diagnosis not present

## 2022-02-01 DIAGNOSIS — I110 Hypertensive heart disease with heart failure: Secondary | ICD-10-CM | POA: Diagnosis not present

## 2022-02-01 DIAGNOSIS — R531 Weakness: Secondary | ICD-10-CM | POA: Diagnosis not present

## 2022-02-01 DIAGNOSIS — J449 Chronic obstructive pulmonary disease, unspecified: Secondary | ICD-10-CM | POA: Diagnosis not present

## 2022-02-01 DIAGNOSIS — J9621 Acute and chronic respiratory failure with hypoxia: Secondary | ICD-10-CM | POA: Diagnosis not present

## 2022-02-01 DIAGNOSIS — A419 Sepsis, unspecified organism: Secondary | ICD-10-CM | POA: Diagnosis not present

## 2022-02-01 DIAGNOSIS — R0902 Hypoxemia: Secondary | ICD-10-CM | POA: Diagnosis not present

## 2022-02-01 DIAGNOSIS — J440 Chronic obstructive pulmonary disease with acute lower respiratory infection: Secondary | ICD-10-CM | POA: Diagnosis not present

## 2022-02-01 DIAGNOSIS — R0600 Dyspnea, unspecified: Secondary | ICD-10-CM | POA: Diagnosis not present

## 2022-02-03 ENCOUNTER — Other Ambulatory Visit: Payer: Self-pay | Admitting: Family Medicine

## 2022-02-03 DIAGNOSIS — F419 Anxiety disorder, unspecified: Secondary | ICD-10-CM

## 2022-02-05 ENCOUNTER — Ambulatory Visit: Payer: Medicare Other

## 2022-02-05 ENCOUNTER — Other Ambulatory Visit: Payer: Self-pay | Admitting: Family Medicine

## 2022-02-05 DIAGNOSIS — G4700 Insomnia, unspecified: Secondary | ICD-10-CM

## 2022-02-05 NOTE — Patient Instructions (Signed)
Visit Information ? ?Ms. Minahan  it was nice speaking with you. Please call me directly [phone] if you have questions about the goals we discussed. ? ?Patient Goals/Self Care Activities: ?-Patient/Caregiver will self-administer medications as prescribed as evidenced by self-report/primary caregiver report  ?-Patient/Caregiver will attend all scheduled provider appointments as evidenced by clinician review of documented attendance to scheduled appointments and patient/caregiver report ?-Patient/Caregiver will call pharmacy for medication refills as evidenced by patient report and review of pharmacy fill history as appropriate ?-Patient/Caregiver will call provider office for new concerns or questions as evidenced by review of documented incoming telephone call notes and patient report ?-Patient/Caregiver verbalizes understanding of plan ?-Patient/Caregiver will focus on medication adherence by taking medications as prescribed ?-Avoid smoke and air pollution ?-Use a humidifier, if needed ?-Self assess COPD action plan zone and make appointment with provider if you have been in the yellow zone for 48 hours without improvement. ?-Expect a call from [facility] ?   ?  ? ? ? ?Patient verbalizes understanding of instructions and care plan provided today and agrees to view in [place]. Active MyChart status confirmed with patient.   ? ?Follow up Plan: Patient would like continued follow-up.  CCM RNCM will outreach the patient within the next 5 weeks.  Patient will call office if needed prior to next encounter ? ?Lazaro Arms, RN ? ?[phone]  ?

## 2022-02-05 NOTE — Chronic Care Management (AMB) (Signed)
?Chronic Care Management  ? ?CCM RN Visit Note ? ?02/05/2022 ?Name: Martha Mora MRN: [id number] DOB: 1946-02-27 ? ?Subjective: ?Martha Mora is a 76 y.o. year old female who is a primary care patient of Martha Hensen, DO. The care management team was consulted for assistance with disease management and care coordination needs.   ? ?Engaged with patient by telephone for follow up visit in response to provider referral for case management and/or care coordination services.  ? ?Consent to Services:  ?The patient was given information about Chronic Care Management services, agreed to services, and gave verbal consent prior to initiation of services.  Please see initial visit note for detailed documentation.  ? ?Patient agreed to services and verbal consent obtained.  ? ? ? ?Summary: the patient continues to maintain positive progress with care plan goals but she continues to experience difficulty with carrying the oxygen that she has and feels that she can not travel far with them. See Care Plan below for interventions and patient self-care actives. ? ?Recommendation: The patient may benefit from taking medications as prescribed and Using her oxygen and following the COPD action plan. ? ?Follow up Plan: Patient would like continued follow-up.  CCM RNCM will outreach the patient within the next 5 weeks.  Patient will call office if needed prior to next encounter ? ?Assessment: Review of patient past medical history, allergies, medications, health status, including review of consultants reports, laboratory and other test data, was performed as part of comprehensive evaluation and provision of chronic care management services.  ? ?SDOH (Social Determinants of Health) assessments and interventions performed:  No ? ?CCM Care Plan ? ? ?Conditions to be addressed/monitored:COPD ? ?Care Plan : RN Case Manager  ?Updates made by Lazaro Arms, RN since 02/05/2022 12:00 AM  ?  ? ?Problem: Chronic Care needs in a patient  with COPD   ?  ? ?Goal: Quality of Life Maintained by using portable Oxygen   ?Start Date: 01/21/2022  ?Expected End Date: 04/17/2022  ?Priority: High  ?Note:   ? ?Current Barriers:  ?Knowledge Deficits related to plan of care for management of COPD  ? ?RNCM Clinical Goal(s):  ?Patient will verbalize understanding of plan for management of COPD as evidenced by 30 days no admissions to the hospital through collaboration with RN Care manager, provider, and care team.  ?Over the next 14 days, patient will verbalize understanding of plan to obtain needed DME.  ?Over the next 21 days, patient will work with care guide and/or [facility]  to address needs related to DME needs. ? ?Interventions: ?1:1 collaboration with primary care provider regarding development and update of comprehensive plan of care as evidenced by provider attestation and co-signature ?Inter-disciplinary care team collaboration (see longitudinal plan of care) ?Evaluation of current treatment plan related to  self management and patient's adherence to plan as established by provider ?Care Coordination needs related to needed DME/Supplies in a patient with COPD ? ?COPD Interventions:  (Status:  Goal on track:  Yes.) Long Term Goal ?Advised patient to track and manage COPD triggers ?Advised patient to self assesses COPD action plan zone and make appointment with provider if in the yellow zone for 48 hours without improvement ?Collaboration with/confirmation of receipt of DME orders at [facility]  ?Evaluation of current treatment plan related to primary care and patient's adherence to plan as established by provider. ?Discussed plans with patient for ongoing care management follow up and provided patient with direct contact information for care management team ?Explain  to the patient that her information will be sent to [facility] for the oxygen concentrator ?02/05/22:  I talked with Martha Mora, and she said that her breathing is fine. She is taking her medications and using her  oxygen continuously at 2.5 liters. No one from [facility] has contacted her. The two issues she is dealing with are she feels confined because the tanks she has run out of air in 3-4 hours are big and hard to carry. She would like something light to take out of town and feel safe having. I sent another message to [employer], giving them the patient's concerns and that she would like a small oxygen concentrator and asking them what I need to do on my part for the patient. ? ? ? ?Patient Goals/Self Care Activities: ?-Patient/Caregiver will self-administer medications as prescribed as evidenced by self-report/primary caregiver report  ?-Patient/Caregiver will attend all scheduled provider appointments as evidenced by clinician review of documented attendance to scheduled appointments and patient/caregiver report ?-Patient/Caregiver will call pharmacy for medication refills as evidenced by patient report and review of pharmacy fill history as appropriate ?-Patient/Caregiver will call provider office for new concerns or questions as evidenced by review of documented incoming telephone call notes and patient report ?-Patient/Caregiver verbalizes understanding of plan ?-Patient/Caregiver will focus on medication adherence by taking medications as prescribed ?-Avoid smoke and air pollution ?-Use a humidifier, if needed ?-Self assess COPD action plan zone and make appointment with provider if you have been in the yellow zone for 48 hours without improvement. ?-Expect a call from [facility] ?  ? ?Lazaro Arms RN, BSN, [place] ?Care Management Coordinator ?[place]  ?Phone: [phone]  ?  ? ? ? ? ? ? ? ?

## 2022-02-06 DIAGNOSIS — I110 Hypertensive heart disease with heart failure: Secondary | ICD-10-CM | POA: Diagnosis not present

## 2022-02-06 DIAGNOSIS — I5030 Unspecified diastolic (congestive) heart failure: Secondary | ICD-10-CM | POA: Diagnosis not present

## 2022-02-06 DIAGNOSIS — J440 Chronic obstructive pulmonary disease with acute lower respiratory infection: Secondary | ICD-10-CM | POA: Diagnosis not present

## 2022-02-06 DIAGNOSIS — A419 Sepsis, unspecified organism: Secondary | ICD-10-CM | POA: Diagnosis not present

## 2022-02-06 DIAGNOSIS — J9621 Acute and chronic respiratory failure with hypoxia: Secondary | ICD-10-CM | POA: Diagnosis not present

## 2022-02-07 DIAGNOSIS — J440 Chronic obstructive pulmonary disease with acute lower respiratory infection: Secondary | ICD-10-CM | POA: Diagnosis not present

## 2022-02-07 DIAGNOSIS — I5030 Unspecified diastolic (congestive) heart failure: Secondary | ICD-10-CM | POA: Diagnosis not present

## 2022-02-07 DIAGNOSIS — J9621 Acute and chronic respiratory failure with hypoxia: Secondary | ICD-10-CM | POA: Diagnosis not present

## 2022-02-07 DIAGNOSIS — A419 Sepsis, unspecified organism: Secondary | ICD-10-CM | POA: Diagnosis not present

## 2022-02-07 DIAGNOSIS — I110 Hypertensive heart disease with heart failure: Secondary | ICD-10-CM | POA: Diagnosis not present

## 2022-02-11 ENCOUNTER — Encounter (INDEPENDENT_AMBULATORY_CARE_PROVIDER_SITE_OTHER): Payer: Medicare Other | Admitting: Ophthalmology

## 2022-02-13 DIAGNOSIS — J440 Chronic obstructive pulmonary disease with acute lower respiratory infection: Secondary | ICD-10-CM | POA: Diagnosis not present

## 2022-02-13 DIAGNOSIS — A419 Sepsis, unspecified organism: Secondary | ICD-10-CM | POA: Diagnosis not present

## 2022-02-13 DIAGNOSIS — I110 Hypertensive heart disease with heart failure: Secondary | ICD-10-CM | POA: Diagnosis not present

## 2022-02-13 DIAGNOSIS — I5030 Unspecified diastolic (congestive) heart failure: Secondary | ICD-10-CM | POA: Diagnosis not present

## 2022-02-13 DIAGNOSIS — J9621 Acute and chronic respiratory failure with hypoxia: Secondary | ICD-10-CM | POA: Diagnosis not present

## 2022-02-18 ENCOUNTER — Encounter (INDEPENDENT_AMBULATORY_CARE_PROVIDER_SITE_OTHER): Payer: Medicare Other | Admitting: Ophthalmology

## 2022-02-21 DIAGNOSIS — I110 Hypertensive heart disease with heart failure: Secondary | ICD-10-CM | POA: Diagnosis not present

## 2022-02-21 DIAGNOSIS — I5030 Unspecified diastolic (congestive) heart failure: Secondary | ICD-10-CM | POA: Diagnosis not present

## 2022-02-21 DIAGNOSIS — J9621 Acute and chronic respiratory failure with hypoxia: Secondary | ICD-10-CM | POA: Diagnosis not present

## 2022-02-21 DIAGNOSIS — A419 Sepsis, unspecified organism: Secondary | ICD-10-CM | POA: Diagnosis not present

## 2022-02-21 DIAGNOSIS — J440 Chronic obstructive pulmonary disease with acute lower respiratory infection: Secondary | ICD-10-CM | POA: Diagnosis not present

## 2022-02-25 ENCOUNTER — Other Ambulatory Visit: Payer: Self-pay | Admitting: Family Medicine

## 2022-02-25 DIAGNOSIS — Z1231 Encounter for screening mammogram for malignant neoplasm of breast: Secondary | ICD-10-CM

## 2022-02-28 ENCOUNTER — Ambulatory Visit: Payer: Medicare Other | Admitting: Family Medicine

## 2022-02-28 DIAGNOSIS — J440 Chronic obstructive pulmonary disease with acute lower respiratory infection: Secondary | ICD-10-CM | POA: Diagnosis not present

## 2022-02-28 DIAGNOSIS — A419 Sepsis, unspecified organism: Secondary | ICD-10-CM | POA: Diagnosis not present

## 2022-02-28 DIAGNOSIS — J9621 Acute and chronic respiratory failure with hypoxia: Secondary | ICD-10-CM | POA: Diagnosis not present

## 2022-02-28 DIAGNOSIS — I5030 Unspecified diastolic (congestive) heart failure: Secondary | ICD-10-CM | POA: Diagnosis not present

## 2022-02-28 DIAGNOSIS — I110 Hypertensive heart disease with heart failure: Secondary | ICD-10-CM | POA: Diagnosis not present

## 2022-03-02 DIAGNOSIS — R0902 Hypoxemia: Secondary | ICD-10-CM | POA: Diagnosis not present

## 2022-03-02 DIAGNOSIS — J449 Chronic obstructive pulmonary disease, unspecified: Secondary | ICD-10-CM | POA: Diagnosis not present

## 2022-03-02 DIAGNOSIS — R0600 Dyspnea, unspecified: Secondary | ICD-10-CM | POA: Diagnosis not present

## 2022-03-02 DIAGNOSIS — R531 Weakness: Secondary | ICD-10-CM | POA: Diagnosis not present

## 2022-03-03 ENCOUNTER — Other Ambulatory Visit: Payer: Self-pay | Admitting: Student

## 2022-03-03 DIAGNOSIS — I4891 Unspecified atrial fibrillation: Secondary | ICD-10-CM

## 2022-03-04 ENCOUNTER — Other Ambulatory Visit (HOSPITAL_COMMUNITY): Payer: Self-pay

## 2022-03-04 ENCOUNTER — Encounter (INDEPENDENT_AMBULATORY_CARE_PROVIDER_SITE_OTHER): Payer: Medicare Other | Admitting: Ophthalmology

## 2022-03-04 DIAGNOSIS — H353231 Exudative age-related macular degeneration, bilateral, with active choroidal neovascularization: Secondary | ICD-10-CM | POA: Diagnosis not present

## 2022-03-04 DIAGNOSIS — R531 Weakness: Secondary | ICD-10-CM | POA: Diagnosis not present

## 2022-03-04 DIAGNOSIS — H35033 Hypertensive retinopathy, bilateral: Secondary | ICD-10-CM

## 2022-03-04 DIAGNOSIS — R0902 Hypoxemia: Secondary | ICD-10-CM | POA: Diagnosis not present

## 2022-03-04 DIAGNOSIS — I10 Essential (primary) hypertension: Secondary | ICD-10-CM | POA: Diagnosis not present

## 2022-03-04 DIAGNOSIS — R0600 Dyspnea, unspecified: Secondary | ICD-10-CM | POA: Diagnosis not present

## 2022-03-04 DIAGNOSIS — H43813 Vitreous degeneration, bilateral: Secondary | ICD-10-CM | POA: Diagnosis not present

## 2022-03-04 DIAGNOSIS — J449 Chronic obstructive pulmonary disease, unspecified: Secondary | ICD-10-CM | POA: Diagnosis not present

## 2022-03-04 MED ORDER — AMIODARONE HCL 200 MG PO TABS
100.0000 mg | ORAL_TABLET | Freq: Every day | ORAL | 0 refills | Status: DC
  Filled 2022-03-04: qty 15, 30d supply, fill #0

## 2022-03-07 ENCOUNTER — Telehealth: Payer: Self-pay

## 2022-03-07 ENCOUNTER — Ambulatory Visit: Payer: Medicare Other | Admitting: Family Medicine

## 2022-03-07 ENCOUNTER — Telehealth: Payer: Medicare Other

## 2022-03-07 NOTE — Telephone Encounter (Signed)
? ?  RN Case Manager ?Care Management  ? Phone Outreach  ? ? ?03/07/2022 ?Name: Martha Mora MRN: [id number] DOB: 08-14-1946 ? ?Martha Mora is a 76 y.o. year old female who is a primary care patient of [name], DO .  ? ?Telephone outreach was unsuccessful A HIPPA compliant phone message was left for the patient providing contact information and requesting a return call.  ? ?Follow Up Plan: Will route chart to Care Guide to see if patient would like to reschedule phone appointment.   ? ?Review of patient status, including review of consultants reports, relevant laboratory and other test results, and collaboration with appropriate care team members and the patient's provider was performed as part of comprehensive patient evaluation and provision of care management services.   ? ?Lazaro Arms RN, BSN, [facility] ?Care Management Coordinator ?[place] ?Phone: [phone] Fax: [phone] ?  ? ? ? ? ? ? ? ? ? ? ? ?

## 2022-03-08 ENCOUNTER — Telehealth: Payer: Self-pay | Admitting: *Deleted

## 2022-03-08 NOTE — Chronic Care Management (AMB) (Signed)
?  Care Management  ? ?Note ? ?03/08/2022 ?Name: Martha Mora MRN: [id number] DOB: May 13, 1946 ? ?Martha Mora is a 76 y.o. year old female who is a primary care patient of [name], DO and is actively engaged with the care management team. I reached out to [name] by phone today to assist with re-scheduling a follow up visit with the RN Case Manager ? ?Follow up plan: ?Unsuccessful telephone outreach attempt made. A HIPAA compliant phone message was left for the patient providing contact information and requesting a return call.  ?The care management team will reach out to the patient again over the next 7 days.  ?If patient returns call to provider office, please advise to call [facility] at [phone]. ? ?Laverda Sorenson  ?Care Guide, Embedded Care Coordination ?[place]  Care Management  ?Direct Dial: [phone] ? ?

## 2022-03-11 ENCOUNTER — Encounter: Payer: Self-pay | Admitting: Family Medicine

## 2022-03-11 ENCOUNTER — Ambulatory Visit (INDEPENDENT_AMBULATORY_CARE_PROVIDER_SITE_OTHER): Payer: Medicare Other

## 2022-03-11 ENCOUNTER — Ambulatory Visit (INDEPENDENT_AMBULATORY_CARE_PROVIDER_SITE_OTHER): Payer: Medicare Other | Admitting: Family Medicine

## 2022-03-11 VITALS — BP 133/50 | HR 70 | Wt 239.1 lb

## 2022-03-11 DIAGNOSIS — Z23 Encounter for immunization: Secondary | ICD-10-CM | POA: Diagnosis not present

## 2022-03-11 DIAGNOSIS — G4700 Insomnia, unspecified: Secondary | ICD-10-CM

## 2022-03-11 DIAGNOSIS — L820 Inflamed seborrheic keratosis: Secondary | ICD-10-CM

## 2022-03-11 DIAGNOSIS — G479 Sleep disorder, unspecified: Secondary | ICD-10-CM | POA: Diagnosis not present

## 2022-03-11 DIAGNOSIS — J302 Other seasonal allergic rhinitis: Secondary | ICD-10-CM

## 2022-03-11 DIAGNOSIS — L570 Actinic keratosis: Secondary | ICD-10-CM

## 2022-03-11 DIAGNOSIS — J9611 Chronic respiratory failure with hypoxia: Secondary | ICD-10-CM | POA: Diagnosis not present

## 2022-03-11 MED ORDER — MIRTAZAPINE 7.5 MG PO TABS: 7.5000 mg | ORAL_TABLET | Freq: Every day | ORAL | 0 refills | Status: DC

## 2022-03-11 MED ORDER — LORATADINE 10 MG PO TABS
10.0000 mg | ORAL_TABLET | Freq: Every day | ORAL | 11 refills | Status: DC
Start: 2022-03-11 — End: 2023-02-11

## 2022-03-11 NOTE — Patient Instructions (Addendum)
Stop by the pharmacy to pick up your prescriptions.   ? ?Be sure to follow up with our dermatology office as scheduled and to schedule an appointment with Dr. Merrilee Jansky office. ? ?Take Care,  ? ?Dr Susa Simmonds  ? ?

## 2022-03-11 NOTE — Progress Notes (Signed)
? ?SUBJECTIVE:  ? ?CHIEF COMPLAINT / HPI:  ? ? ? ?Martha Mora is a 76 y.o. female here for: ? ?Skin Lesions ?Patient would like another treatment today.  She also states she has concerning lesion on her right hand.  Reports she had a lesion removed previously because it was concerning for cancer on the same hand. ? ?Rhinorrhea  ?Reports for persistent rhinorrhea.  She took some OTC allergy medicine with relief.  She would like to restart her allergy medicine. ? ?Sleep  ?She stopped trazodone as it was causing her restless leg syndrome to be worse.  Now she has been unable to sleep.  He sleeps about an hour and a half each night.  She says that she sometimes is up until 5 AM.  She feels like her body is restless. ? ? ? ? ?PERTINENT  PMH / PSH: reviewed and updated as appropriate  ? ?OBJECTIVE:  ? ?BP (!) 133/50   Pulse 70   Wt 239 lb 2 oz (108.5 kg)   SpO2 95% Comment: with oxygen  BMI 45.18 kg/m?   ? ?GEN: well appearing pleasant elderly female in no acute distress  ?CVS: well perfused  ?RESP: speaking in full sentences without pause, CTAB, wearing 2 L [place] ?SKIN: right hand with rough area scaly patch c/f actinic keratosis, left hand: non-uniform hyperpigmented c/f SK,  right forehead with multiple flesh tone SK's  ? ?Left hand  ? ? ?Right hand  ? ? ?PROCEDURE: Cryotherapy seborrheic keratosis of forehead      ?Written consent obtained.  Risks and benefits discussed.  ? ?The area surrounding the skin lesion was prepared in the usual sterile manner. A test freeze was performed ensuring coverage of entire area as above.  The cryotherapy gun was then applied for 3 seconds until an ice ball formed with a 5-7 mm border.  This was allowed to thaw and then the cryotherapy was again applied for 3 seconds to an ice ball of 5-7 mm.  Repeat for a total of 4 lesions. Pt tolerated procedure well.  ?  ? ? ?ASSESSMENT/PLAN:  ? ? ? ?Seborrheic keratoses ?Cryotherapy performed today on right 4 forehead lesions near the  hairline. The patient tolerated the procedure well.  Return precautions provided.   ? 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   ? ?Seasonal Allergies  ?Restart Claritin 10 mg daily.  ? ?Insomnia ?Chronic.  Stopped trazodone due to RLS.  Melatonin is not working as well as it used to.  Seroquel was discontinued previously for concern of QT prolongation.  Mirtazapine 7.5 mg qhs.  Sleep hygiene reviewed. May benefit for CBT.  ? ?Chronic respiratory failure ([place]) ?She uses 2 to 3 L daily.  History of atrial fibrillation on amiodarone 100 mg daily.  I worry about pulmonary fibrosis with this medication.  She also has OSA, vocal cord dysfunction and COPD.  Previously placed a DME order for bag O2 as she is a fall risk (uses rolling walker) and has to drag the O2 tank along with her ambulatory equipment. She has an upcoming meeting to see if she qualifies for a c smaller tank.  Previously followed with [facility].  It appears she has not had an office visit with them in quite some time.  Referral placed so she can re-establish care. ? ? ? ?[name], DO ?PGY-3, [facility] ?03/11/2022  ? ? ? ? ? ? ? ? ?

## 2022-03-13 ENCOUNTER — Encounter: Payer: Self-pay | Admitting: Family Medicine

## 2022-03-13 NOTE — Assessment & Plan Note (Addendum)
Chronic.  Stopped trazodone due to RLS.  Melatonin is not working as well as it used to.  Seroquel was discontinued previously for concern of QT prolongation.  Mirtazapine 7.5 mg qhs.  Sleep hygiene reviewed. May benefit for CBT.  ?

## 2022-03-13 NOTE — Assessment & Plan Note (Addendum)
She uses 2 to 3 L daily.  History of atrial fibrillation on amiodarone 100 mg daily.  I worry about pulmonary fibrosis with this medication.  She also has OSA, vocal cord dysfunction and COPD.  Previously placed a DME order for bag O2 as she is a fall risk (uses rolling walker) and has to drag the O2 tank along with her ambulatory equipment. She has an upcoming meeting to see if she qualifies for a c smaller tank.  Previously followed with [facility].  It appears she has not had an office visit with them in quite some time.  Referral placed so she can re-establish care. ?

## 2022-03-14 ENCOUNTER — Other Ambulatory Visit: Payer: Self-pay

## 2022-03-14 ENCOUNTER — Ambulatory Visit (INDEPENDENT_AMBULATORY_CARE_PROVIDER_SITE_OTHER): Payer: Medicare Other | Admitting: Family Medicine

## 2022-03-14 ENCOUNTER — Other Ambulatory Visit: Payer: Self-pay | Admitting: Hematology

## 2022-03-14 VITALS — BP 128/53 | HR 65 | Ht 61.0 in | Wt 236.4 lb

## 2022-03-14 DIAGNOSIS — L989 Disorder of the skin and subcutaneous tissue, unspecified: Secondary | ICD-10-CM

## 2022-03-14 DIAGNOSIS — F419 Anxiety disorder, unspecified: Secondary | ICD-10-CM

## 2022-03-14 DIAGNOSIS — D0461 Carcinoma in situ of skin of right upper limb, including shoulder: Secondary | ICD-10-CM | POA: Diagnosis not present

## 2022-03-14 NOTE — Progress Notes (Signed)
? ? ?  SUBJECTIVE:  ? ?CHIEF COMPLAINT / HPI:  ? ?Skin lesions: ?She c/o dry, scaly, red lesions on the back of her hands, more on the right. She has about 3-4 lesions in total. Lesions have been present for a while but have increased over the years. She denies any itching or pain. She endorsed past hx of frequent sunlight exposure. She is here for further evaluation and treatment recommendation.  ? ?PERTINENT  PMH / PSH: PMHx reviewed ? ?OBJECTIVE:  ? ?BP (!) 128/53   Pulse 65   Wt 236 lb 6.4 oz (107.2 kg)   SpO2 93%   BMI 44.67 kg/m?   ?Physical Exam ?Vitals reviewed.  ?Constitutional:   ?   General: She is not in acute distress. ?   Appearance: Normal appearance.  ?Skin: ?   Comments: Round, scaly, mildly erythematous macular lesion on the dorsum of her right hand measuring about 1 by 2 cm circumferentially. Similar, non-erythematous, scaly lesions, which are smaller in size on her right hand and a few on the left. ?  ? ? ? ?ASSESSMENT/PLAN:  ? 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 ?Cryotherapy in the future depending on biopsy result. ?She agreed to proceed with biopsy today. ? ?Skin Biopsy Procedure Note ? ?PRE-OP DIAGNOSIS: Hand skin lesions/AK ? ?POST-OP DIAGNOSIS: Same  ? ?PROCEDURE: skin biopsy ? ?Performing Physician: Andrena Mews ? ? ?PROCEDURE:  ?Punch (Size 71m) ? ?The area surrounding the skin lesion was prepared and draped in the  usual sterile manner. The lesion was removed in the usual manner by the  biopsy method noted above. Hemostasis was assured. The patient tolerated  the procedure well. ? ?Closure:    pressure dressing with brown tape - triple antibiotic cream applied. ? ?Followup: The patient tolerated the procedure well without  complications.  Standard post-procedure care is explained and return  precautions are given. ?I will contact her soon with her biopsy report. ?         ? ?KAndrena Mews MD ?[facility] ? ?

## 2022-03-14 NOTE — Patient Instructions (Signed)

## 2022-03-15 ENCOUNTER — Telehealth: Payer: Self-pay

## 2022-03-15 MED ORDER — BUSPIRONE HCL 15 MG PO TABS: 15.0000 mg | ORAL_TABLET | Freq: Three times a day (TID) | ORAL | 1 refills | Status: DC

## 2022-03-15 NOTE — Chronic Care Management (AMB) (Signed)
?  Care Management  ? ?Note ? ?03/15/2022 ?Name: Alegra Rost MRN: [id number] DOB: 02-Nov-1946 ? ?Odette Watanabe is a 76 y.o. year old female who is a primary care patient of [name], DO and is actively engaged with the care management team. I reached out to [name] by phone today to assist with re-scheduling a follow up visit with the RN Case Manager ? ?Follow up plan: ?Telephone appointment with care management team member scheduled for:03/25/22 ? ?Laverda Sorenson  ?Care Guide, Embedded Care Coordination ?[place]  Care Management  ?Direct Dial: [phone] ? ?

## 2022-03-15 NOTE — Chronic Care Management (AMB) (Signed)
?  Care Management  ? ?Note ? ?03/15/2022 ?Name: Najah Liverman MRN: [id number] DOB: 04-Jun-1946 ? ?Zylah Elsbernd is a 76 y.o. year old female who is a primary care patient of [name], DO and is actively engaged with the care management team. I reached out to [name] by phone today to assist with re-scheduling a follow up visit with the RN Case Manager ? ?Follow up plan: ?Unsuccessful telephone outreach attempt made. A HIPAA compliant phone message was left for the patient providing contact information and requesting a return call.  ?The care management team will reach out to the patient again over the next 7 days.  ?If patient returns call to provider office, please advise to call [place]  at [phone]. ? ?Laverda Sorenson  ?Care Guide, Embedded Care Coordination ?[place]  Care Management  ?Direct Dial: [phone] ? ?

## 2022-03-15 NOTE — Telephone Encounter (Signed)
? ?  RN Case Manager ?Care Management  ? Phone Outreach  ? ? ?03/15/2022 ?Name: Martha Mora MRN: [id number] DOB: [birth year] ? ?Martha Mora is a 76 y.o. year old female who is a primary care patient of [name], DO .  ? ?This morning, [name] called RNCM to inquire about the location of [employer] in [facility] for her scheduled test. I promptly left the necessary details on her answering machine after being contacted by the patient. Despite sending a community message, RNCM did not receive a response. Consequently, I contacted the company and spoke to [place], who informed me that the address is [place]. ? ?Follow Up Plan: CM RN will follow up with the patient at he next scheduled Interval.  ? ?Review of patient status, including review of consultants reports, relevant laboratory and other test results, and collaboration with appropriate care team members and the patient's provider was performed as part of comprehensive patient evaluation and provision of care management services.   ? ?Lazaro Arms RN, BSN, [facility] ?Care Management Coordinator ?[place] ?Phone: [phone] Fax: [phone] ?  ? ? ? ? ? ? ? ? ? ? ? ?

## 2022-03-16 ENCOUNTER — Other Ambulatory Visit: Payer: Self-pay | Admitting: Family Medicine

## 2022-03-16 DIAGNOSIS — M5431 Sciatica, right side: Secondary | ICD-10-CM

## 2022-03-18 ENCOUNTER — Other Ambulatory Visit: Payer: Self-pay | Admitting: Family Medicine

## 2022-03-18 ENCOUNTER — Encounter: Payer: Self-pay | Admitting: Family Medicine

## 2022-03-18 DIAGNOSIS — G4700 Insomnia, unspecified: Secondary | ICD-10-CM

## 2022-03-18 MED ORDER — RAMELTEON 8 MG PO TABS: 8.0000 mg | ORAL_TABLET | Freq: Every day | ORAL | 0 refills | Status: DC

## 2022-03-18 NOTE — Progress Notes (Signed)
Stopped trial of Mirtazapine due to worsening RLS.  Switch to ramelteon. Rx sent to pharmacy.  Pt to follow up in office for any follow up questions or concerns.   ? ?[name], DO ? ? ?

## 2022-03-19 ENCOUNTER — Telehealth: Payer: Self-pay | Admitting: Family Medicine

## 2022-03-19 ENCOUNTER — Ambulatory Visit: Payer: Medicare Other

## 2022-03-19 ENCOUNTER — Other Ambulatory Visit: Payer: Self-pay

## 2022-03-19 DIAGNOSIS — F419 Anxiety disorder, unspecified: Secondary | ICD-10-CM

## 2022-03-19 NOTE — Telephone Encounter (Signed)
HIPAA compliant callback message left. ? ?Please, advise her, when she calls, that her skin biopsy result is abnormal. Help her schedule dermatology clinic appointment for treatment. ? 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 ?

## 2022-03-20 ENCOUNTER — Ambulatory Visit: Payer: Medicare Other

## 2022-03-20 NOTE — Chronic Care Management (AMB) (Signed)
?Chronic Care Management  ? ?CCM RN Visit Note ? ?03/20/2022 ?Name: Martha Mora MRN: [id number] DOB: 1946-10-23 ? ?Subjective: ?Martha Mora is a 76 y.o. year old female who is a primary care patient of [name], DO. The care management team was consulted for assistance with disease management and care coordination needs.   ? ?Engaged with patient by telephone for follow up visit in response to provider referral for case management and/or care coordination services.  ? ?Consent to Services:  ?The patient was given information about Chronic Care Management services, agreed to services, and gave verbal consent prior to initiation of services.  Please see initial visit note for detailed documentation.  ? ?Patient agreed to services and verbal consent obtained.  ? ? ?Summary: The patient is making progress with but found that her oxygen level was running low. When she checked she found holes in the tubing.  The cat had punctured the line.  She is going to patch it until she can find her other tubing in her house. . See Care Plan below for interventions and patient self-care actives. ? ?Recommendation: The patient may benefit from taking medications as prescribed, find her new tubing in the home, and The patient agrees. ? ?Follow up Plan: Patient would like continued follow-up.  CCM RNCM will outreach the patient within the next 2 weeks.  Patient will call office if needed prior to next encounter ? ? ?Assessment: Review of patient past medical history, allergies, medications, health status, including review of consultants reports, laboratory and other test data, was performed as part of comprehensive evaluation and provision of chronic care management services.  ? ?SDOH (Social Determinants of Health) assessments and interventions performed:  No ? ?CCM Care Plan ?Conditions to be addressed/monitored:COPD ? ?Care Plan : RN Case Manager  ?Updates made by Lazaro Arms, RN since 03/20/2022 12:00 AM  ?   ? ?Problem: Chronic Care needs in a patient with COPD   ?  ? ?Goal: Quality of Life Maintained by using portable Oxygen   ?Start Date: 01/21/2022  ?Expected End Date: 05/17/2022  ?Priority: High  ?Note:   ? ?Current Barriers:  ?Knowledge Deficits related to plan of care for management of COPD  ? ?RNCM Clinical Goal(s):  ?Patient will verbalize understanding of plan for management of COPD as evidenced by 30 days no admissions to the hospital through collaboration with RN Care manager, provider, and care team.  ?Over the next 14 days, patient will verbalize understanding of plan to obtain needed DME.  ?Over the next 21 days, patient will work with care guide and/or [employer]  to address needs related to DME needs. ? ?Interventions: ?1:1 collaboration with primary care provider regarding development and update of comprehensive plan of care as evidenced by provider attestation and co-signature ?Inter-disciplinary care team collaboration (see longitudinal plan of care) ?Evaluation of current treatment plan related to  self management and patient's adherence to plan as established by provider ?Care Coordination needs related to needed DME/Supplies in a patient with COPD ? ?COPD Interventions:  (Status:  Goal on track:  Yes.) Long Term Goal ?Advised patient to track and manage COPD triggers ?Advised patient to self assesses COPD action plan zone and make appointment with provider if in the yellow zone for 48 hours without improvement ?Collaboration with/confirmation of receipt of DME orders at [place]  ?Evaluation of current treatment plan related to primary care and patient's adherence to plan as established by provider. ?Discussed plans with patient for ongoing care management  follow up and provided patient with direct contact information for care management team ?Explain to the patient that her information will be sent to [employer] for the oxygen concentrator ?03/20/22:  During our conversation, Martha Mora informed me that she is  doing well. However, she has been experiencing low oxygen levels for the past few days, which she discovered was caused by holes in her tubing that her cat punctured. She has patched the holes until she can find new tubing. Martha Mora has recently moved to a new home that is currently disorganized. Unfortunately, she could not attend her appointment with [employer] on Monday as it was rescheduled for 03/25/22. Nevertheless, I have provided her with the address for the meeting. ? ? ?Patient Goals/Self Care Activities: ?-Patient/Caregiver will self-administer medications as prescribed as evidenced by self-report/primary caregiver report  ?-Patient/Caregiver will attend all scheduled provider appointments as evidenced by clinician review of documented attendance to scheduled appointments and patient/caregiver report ?-Patient/Caregiver will call pharmacy for medication refills as evidenced by patient report and review of pharmacy fill history as appropriate ?-Patient/Caregiver will call provider office for new concerns or questions as evidenced by review of documented incoming telephone call notes and patient report ?-Patient/Caregiver verbalizes understanding of plan ?-Patient/Caregiver will focus on medication adherence by taking medications as prescribed ?-Avoid smoke and air pollution ?-Use a humidifier, if needed ?-Self assess COPD action plan zone and make appointment with provider if you have been in the yellow zone for 48 hours without improvement. ?-Find new tubing ?  ? ?Lazaro Arms RN, BSN, [place] ?Care Management Coordinator ?[place]  ?Phone: [phone]  ?  ? ? ? ? ? ? ? ?

## 2022-03-20 NOTE — Patient Instructions (Signed)
Visit Information ? ?Ms. Lortz  it was nice speaking with you. Please call me directly [phone] if you have questions about the goals we discussed. ? ?Patient Goals/Self Care Activities: ?-Patient/Caregiver will self-administer medications as prescribed as evidenced by self-report/primary caregiver report  ?-Patient/Caregiver will attend all scheduled provider appointments as evidenced by clinician review of documented attendance to scheduled appointments and patient/caregiver report ?-Patient/Caregiver will call pharmacy for medication refills as evidenced by patient report and review of pharmacy fill history as appropriate ?-Patient/Caregiver will call provider office for new concerns or questions as evidenced by review of documented incoming telephone call notes and patient report ?-Patient/Caregiver verbalizes understanding of plan ?-Patient/Caregiver will focus on medication adherence by taking medications as prescribed ?-Avoid smoke and air pollution ?-Use a humidifier, if needed ?-Self assess COPD action plan zone and make appointment with provider if you have been in the yellow zone for 48 hours without improvement. ?-Find new tubing ?  ? ? ?Patient verbalizes understanding of instructions and care plan provided today and agrees to view in [place]. Active MyChart status confirmed with patient.   ? ?Follow up Plan: Patient would like continued follow-up.  CCM RNCM will outreach the patient within the next 2 weeks.  Patient will call office if needed prior to next encounter ? ?Lazaro Arms, RN ? ?[phone]  ?

## 2022-03-21 NOTE — Telephone Encounter (Signed)
Second attempt to reach patient about her test result. ? ?HIPAA compliant callback message was left. I will send her a result letter. ?

## 2022-03-24 ENCOUNTER — Encounter (HOSPITAL_COMMUNITY): Payer: Self-pay | Admitting: Emergency Medicine

## 2022-03-24 ENCOUNTER — Observation Stay (HOSPITAL_COMMUNITY): Payer: Medicare Other

## 2022-03-24 ENCOUNTER — Inpatient Hospital Stay (HOSPITAL_COMMUNITY)
Admission: EM | Admit: 2022-03-24 | Discharge: 2022-04-08 | DRG: 813 | Disposition: A | Discharge status: Home Health | Payer: Medicare Other | Attending: Family Medicine | Admitting: Family Medicine

## 2022-03-24 ENCOUNTER — Other Ambulatory Visit: Payer: Self-pay

## 2022-03-24 DIAGNOSIS — J9621 Acute and chronic respiratory failure with hypoxia: Secondary | ICD-10-CM | POA: Diagnosis present

## 2022-03-24 DIAGNOSIS — Z9981 Dependence on supplemental oxygen: Secondary | ICD-10-CM

## 2022-03-24 DIAGNOSIS — Z7901 Long term (current) use of anticoagulants: Secondary | ICD-10-CM | POA: Diagnosis not present

## 2022-03-24 DIAGNOSIS — I5033 Acute on chronic diastolic (congestive) heart failure: Secondary | ICD-10-CM | POA: Diagnosis not present

## 2022-03-24 DIAGNOSIS — Z8249 Family history of ischemic heart disease and other diseases of the circulatory system: Secondary | ICD-10-CM

## 2022-03-24 DIAGNOSIS — Z923 Personal history of irradiation: Secondary | ICD-10-CM

## 2022-03-24 DIAGNOSIS — D6832 Hemorrhagic disorder due to extrinsic circulating anticoagulants: Principal | ICD-10-CM | POA: Diagnosis present

## 2022-03-24 DIAGNOSIS — N3946 Mixed incontinence: Secondary | ICD-10-CM | POA: Diagnosis present

## 2022-03-24 DIAGNOSIS — Z6841 Body Mass Index (BMI) 40.0 and over, adult: Secondary | ICD-10-CM

## 2022-03-24 DIAGNOSIS — R34 Anuria and oliguria: Secondary | ICD-10-CM | POA: Diagnosis not present

## 2022-03-24 DIAGNOSIS — Z800 Family history of malignant neoplasm of digestive organs: Secondary | ICD-10-CM

## 2022-03-24 DIAGNOSIS — J9692 Respiratory failure, unspecified with hypercapnia: Secondary | ICD-10-CM | POA: Diagnosis not present

## 2022-03-24 DIAGNOSIS — D638 Anemia in other chronic diseases classified elsewhere: Secondary | ICD-10-CM | POA: Diagnosis not present

## 2022-03-24 DIAGNOSIS — F05 Delirium due to known physiological condition: Secondary | ICD-10-CM | POA: Diagnosis not present

## 2022-03-24 DIAGNOSIS — Z8371 Family history of colonic polyps: Secondary | ICD-10-CM

## 2022-03-24 DIAGNOSIS — J90 Pleural effusion, not elsewhere classified: Secondary | ICD-10-CM | POA: Diagnosis not present

## 2022-03-24 DIAGNOSIS — Z4682 Encounter for fitting and adjustment of non-vascular catheter: Secondary | ICD-10-CM | POA: Diagnosis not present

## 2022-03-24 DIAGNOSIS — D72829 Elevated white blood cell count, unspecified: Secondary | ICD-10-CM | POA: Diagnosis present

## 2022-03-24 DIAGNOSIS — Z818 Family history of other mental and behavioral disorders: Secondary | ICD-10-CM

## 2022-03-24 DIAGNOSIS — I110 Hypertensive heart disease with heart failure: Secondary | ICD-10-CM | POA: Diagnosis present

## 2022-03-24 DIAGNOSIS — Z880 Allergy status to penicillin: Secondary | ICD-10-CM

## 2022-03-24 DIAGNOSIS — R531 Weakness: Secondary | ICD-10-CM | POA: Diagnosis not present

## 2022-03-24 DIAGNOSIS — N179 Acute kidney failure, unspecified: Secondary | ICD-10-CM | POA: Diagnosis not present

## 2022-03-24 DIAGNOSIS — J449 Chronic obstructive pulmonary disease, unspecified: Secondary | ICD-10-CM | POA: Diagnosis not present

## 2022-03-24 DIAGNOSIS — Z87442 Personal history of urinary calculi: Secondary | ICD-10-CM

## 2022-03-24 DIAGNOSIS — D62 Acute posthemorrhagic anemia: Secondary | ICD-10-CM | POA: Diagnosis not present

## 2022-03-24 DIAGNOSIS — J9601 Acute respiratory failure with hypoxia: Secondary | ICD-10-CM | POA: Diagnosis not present

## 2022-03-24 DIAGNOSIS — Z981 Arthrodesis status: Secondary | ICD-10-CM

## 2022-03-24 DIAGNOSIS — I5031 Acute diastolic (congestive) heart failure: Secondary | ICD-10-CM | POA: Diagnosis not present

## 2022-03-24 DIAGNOSIS — I959 Hypotension, unspecified: Secondary | ICD-10-CM | POA: Diagnosis not present

## 2022-03-24 DIAGNOSIS — E162 Hypoglycemia, unspecified: Secondary | ICD-10-CM | POA: Diagnosis not present

## 2022-03-24 DIAGNOSIS — Z881 Allergy status to other antibiotic agents status: Secondary | ICD-10-CM

## 2022-03-24 DIAGNOSIS — J9691 Respiratory failure, unspecified with hypoxia: Secondary | ICD-10-CM | POA: Diagnosis present

## 2022-03-24 DIAGNOSIS — J9600 Acute respiratory failure, unspecified whether with hypoxia or hypercapnia: Secondary | ICD-10-CM | POA: Diagnosis not present

## 2022-03-24 DIAGNOSIS — Z955 Presence of coronary angioplasty implant and graft: Secondary | ICD-10-CM

## 2022-03-24 DIAGNOSIS — G4700 Insomnia, unspecified: Secondary | ICD-10-CM | POA: Diagnosis present

## 2022-03-24 DIAGNOSIS — Z9049 Acquired absence of other specified parts of digestive tract: Secondary | ICD-10-CM

## 2022-03-24 DIAGNOSIS — Z853 Personal history of malignant neoplasm of breast: Secondary | ICD-10-CM

## 2022-03-24 DIAGNOSIS — I4891 Unspecified atrial fibrillation: Secondary | ICD-10-CM | POA: Diagnosis not present

## 2022-03-24 DIAGNOSIS — R040 Epistaxis: Secondary | ICD-10-CM | POA: Diagnosis not present

## 2022-03-24 DIAGNOSIS — G2581 Restless legs syndrome: Secondary | ICD-10-CM | POA: Diagnosis not present

## 2022-03-24 DIAGNOSIS — R079 Chest pain, unspecified: Secondary | ICD-10-CM | POA: Diagnosis not present

## 2022-03-24 DIAGNOSIS — I2510 Atherosclerotic heart disease of native coronary artery without angina pectoris: Secondary | ICD-10-CM | POA: Diagnosis present

## 2022-03-24 DIAGNOSIS — R918 Other nonspecific abnormal finding of lung field: Secondary | ICD-10-CM | POA: Diagnosis not present

## 2022-03-24 DIAGNOSIS — R58 Hemorrhage, not elsewhere classified: Secondary | ICD-10-CM | POA: Diagnosis not present

## 2022-03-24 DIAGNOSIS — Z743 Need for continuous supervision: Secondary | ICD-10-CM | POA: Diagnosis not present

## 2022-03-24 DIAGNOSIS — F419 Anxiety disorder, unspecified: Secondary | ICD-10-CM | POA: Diagnosis present

## 2022-03-24 DIAGNOSIS — G928 Other toxic encephalopathy: Secondary | ICD-10-CM | POA: Diagnosis present

## 2022-03-24 DIAGNOSIS — K219 Gastro-esophageal reflux disease without esophagitis: Secondary | ICD-10-CM | POA: Diagnosis present

## 2022-03-24 DIAGNOSIS — M419 Scoliosis, unspecified: Secondary | ICD-10-CM | POA: Diagnosis not present

## 2022-03-24 DIAGNOSIS — F32A Depression, unspecified: Secondary | ICD-10-CM | POA: Diagnosis present

## 2022-03-24 DIAGNOSIS — I517 Cardiomegaly: Secondary | ICD-10-CM | POA: Diagnosis not present

## 2022-03-24 DIAGNOSIS — R0600 Dyspnea, unspecified: Secondary | ICD-10-CM | POA: Diagnosis not present

## 2022-03-24 DIAGNOSIS — Z803 Family history of malignant neoplasm of breast: Secondary | ICD-10-CM

## 2022-03-24 DIAGNOSIS — Z781 Physical restraint status: Secondary | ICD-10-CM

## 2022-03-24 DIAGNOSIS — R0602 Shortness of breath: Secondary | ICD-10-CM | POA: Diagnosis not present

## 2022-03-24 DIAGNOSIS — Z825 Family history of asthma and other chronic lower respiratory diseases: Secondary | ICD-10-CM

## 2022-03-24 DIAGNOSIS — J9690 Respiratory failure, unspecified, unspecified whether with hypoxia or hypercapnia: Secondary | ICD-10-CM | POA: Diagnosis not present

## 2022-03-24 DIAGNOSIS — E870 Hyperosmolality and hypernatremia: Secondary | ICD-10-CM | POA: Diagnosis not present

## 2022-03-24 DIAGNOSIS — G9340 Encephalopathy, unspecified: Secondary | ICD-10-CM | POA: Diagnosis present

## 2022-03-24 DIAGNOSIS — R001 Bradycardia, unspecified: Secondary | ICD-10-CM | POA: Diagnosis not present

## 2022-03-24 DIAGNOSIS — Z888 Allergy status to other drugs, medicaments and biological substances status: Secondary | ICD-10-CM

## 2022-03-24 DIAGNOSIS — T45515A Adverse effect of anticoagulants, initial encounter: Secondary | ICD-10-CM | POA: Diagnosis not present

## 2022-03-24 DIAGNOSIS — M1990 Unspecified osteoarthritis, unspecified site: Secondary | ICD-10-CM | POA: Diagnosis present

## 2022-03-24 DIAGNOSIS — R410 Disorientation, unspecified: Secondary | ICD-10-CM | POA: Diagnosis not present

## 2022-03-24 DIAGNOSIS — I252 Old myocardial infarction: Secondary | ICD-10-CM

## 2022-03-24 DIAGNOSIS — Z87891 Personal history of nicotine dependence: Secondary | ICD-10-CM

## 2022-03-24 DIAGNOSIS — E876 Hypokalemia: Secondary | ICD-10-CM | POA: Diagnosis not present

## 2022-03-24 DIAGNOSIS — M810 Age-related osteoporosis without current pathological fracture: Secondary | ICD-10-CM | POA: Diagnosis present

## 2022-03-24 DIAGNOSIS — J811 Chronic pulmonary edema: Secondary | ICD-10-CM | POA: Diagnosis not present

## 2022-03-24 DIAGNOSIS — M5431 Sciatica, right side: Secondary | ICD-10-CM

## 2022-03-24 DIAGNOSIS — R0902 Hypoxemia: Secondary | ICD-10-CM | POA: Diagnosis not present

## 2022-03-24 DIAGNOSIS — R6889 Other general symptoms and signs: Secondary | ICD-10-CM | POA: Diagnosis not present

## 2022-03-24 DIAGNOSIS — G4733 Obstructive sleep apnea (adult) (pediatric): Secondary | ICD-10-CM | POA: Diagnosis present

## 2022-03-24 DIAGNOSIS — J9622 Acute and chronic respiratory failure with hypercapnia: Secondary | ICD-10-CM | POA: Diagnosis not present

## 2022-03-24 DIAGNOSIS — J9602 Acute respiratory failure with hypercapnia: Secondary | ICD-10-CM | POA: Diagnosis not present

## 2022-03-24 DIAGNOSIS — Z86010 Personal history of colonic polyps: Secondary | ICD-10-CM

## 2022-03-24 DIAGNOSIS — Z79899 Other long term (current) drug therapy: Secondary | ICD-10-CM

## 2022-03-24 DIAGNOSIS — I509 Heart failure, unspecified: Secondary | ICD-10-CM | POA: Diagnosis not present

## 2022-03-24 DIAGNOSIS — E785 Hyperlipidemia, unspecified: Secondary | ICD-10-CM | POA: Diagnosis present

## 2022-03-24 DIAGNOSIS — Z7982 Long term (current) use of aspirin: Secondary | ICD-10-CM

## 2022-03-24 DIAGNOSIS — T424X5A Adverse effect of benzodiazepines, initial encounter: Secondary | ICD-10-CM | POA: Diagnosis present

## 2022-03-24 DIAGNOSIS — Z85828 Personal history of other malignant neoplasm of skin: Secondary | ICD-10-CM

## 2022-03-24 DIAGNOSIS — G8929 Other chronic pain: Secondary | ICD-10-CM | POA: Diagnosis present

## 2022-03-24 DIAGNOSIS — Z823 Family history of stroke: Secondary | ICD-10-CM

## 2022-03-24 LAB — CBC WITH DIFFERENTIAL/PLATELET
Abs Immature Granulocytes: 0.09 10*3/uL — ABNORMAL HIGH (ref 0.00–0.07)
Basophils Absolute: 0.1 10*3/uL (ref 0.0–0.1)
Basophils Relative: 1 %
Eosinophils Absolute: 0.4 10*3/uL (ref 0.0–0.5)
Eosinophils Relative: 2 %
HCT: 40 % (ref 36.0–46.0)
Hemoglobin: 12.2 g/dL (ref 12.0–15.0)
Immature Granulocytes: 1 %
Lymphocytes Relative: 14 %
Lymphs Abs: 2.1 10*3/uL (ref 0.7–4.0)
MCH: 29.9 pg (ref 26.0–34.0)
MCHC: 30.5 g/dL (ref 30.0–36.0)
MCV: 98 fL (ref 80.0–100.0)
Monocytes Absolute: 1.2 10*3/uL — ABNORMAL HIGH (ref 0.1–1.0)
Monocytes Relative: 8 %
Neutro Abs: 11.9 10*3/uL — ABNORMAL HIGH (ref 1.7–7.7)
Neutrophils Relative %: 74 %
Platelets: 279 10*3/uL (ref 150–400)
RBC: 4.08 MIL/uL (ref 3.87–5.11)
RDW: 13.9 % (ref 11.5–15.5)
WBC: 15.7 10*3/uL — ABNORMAL HIGH (ref 4.0–10.5)
nRBC: 0 % (ref 0.0–0.2)

## 2022-03-24 LAB — I-STAT ARTERIAL BLOOD GAS, ED
Acid-Base Excess: 1 mmol/L (ref 0.0–2.0)
Bicarbonate: 25.8 mmol/L (ref 20.0–28.0)
Calcium, Ion: 1.22 mmol/L (ref 1.15–1.40)
HCT: 39 % (ref 36.0–46.0)
Hemoglobin: 13.3 g/dL (ref 12.0–15.0)
O2 Saturation: 91 %
Potassium: 4.3 mmol/L (ref 3.5–5.1)
Sodium: 138 mmol/L (ref 135–145)
TCO2: 27 mmol/L (ref 22–32)
pCO2 arterial: 40.1 mmHg (ref 32–48)
pH, Arterial: 7.417 (ref 7.35–7.45)
pO2, Arterial: 59 mmHg — ABNORMAL LOW (ref 83–108)

## 2022-03-24 LAB — CBC
HCT: 41.1 % (ref 36.0–46.0)
Hemoglobin: 12.7 g/dL (ref 12.0–15.0)
MCH: 30.6 pg (ref 26.0–34.0)
MCHC: 30.9 g/dL (ref 30.0–36.0)
MCV: 99 fL (ref 80.0–100.0)
Platelets: 302 10*3/uL (ref 150–400)
RBC: 4.15 MIL/uL (ref 3.87–5.11)
RDW: 14 % (ref 11.5–15.5)
WBC: 18.3 10*3/uL — ABNORMAL HIGH (ref 4.0–10.5)
nRBC: 0 % (ref 0.0–0.2)

## 2022-03-24 LAB — BASIC METABOLIC PANEL
Anion gap: 7 (ref 5–15)
BUN: 18 mg/dL (ref 8–23)
CO2: 27 mmol/L (ref 22–32)
Calcium: 9.2 mg/dL (ref 8.9–10.3)
Chloride: 107 mmol/L (ref 98–111)
Creatinine, Ser: 0.89 mg/dL (ref 0.44–1.00)
GFR, Estimated: >60 mL/min (ref >60)
Glucose, Bld: 165 mg/dL — ABNORMAL HIGH (ref 70–99)
Potassium: 4.6 mmol/L (ref 3.5–5.1)
Sodium: 141 mmol/L (ref 135–145)

## 2022-03-24 MED ORDER — AYR SALINE NASAL NA GEL
1.0000 "application " | NASAL | Status: DC | PRN
  Filled 2022-03-24: qty 1

## 2022-03-24 MED ORDER — SUCCINYLCHOLINE CHLORIDE 200 MG/10ML IV SOSY
PREFILLED_SYRINGE | INTRAVENOUS | Status: AC
  Filled 2022-03-24: qty 10

## 2022-03-24 MED ORDER — LORAZEPAM 2 MG/ML IJ SOLN
1.0000 mg | Freq: Once | INTRAMUSCULAR | Status: AC
Start: 2022-03-24 — End: 2022-03-24
  Administered 2022-03-24: 1 mg via INTRAVENOUS
  Filled 2022-03-24: qty 1

## 2022-03-24 MED ORDER — ETOMIDATE 2 MG/ML IV SOLN
INTRAVENOUS | Status: DC | PRN
  Administered 2022-03-24: 20 mg via INTRAVENOUS

## 2022-03-24 MED ORDER — OXYMETAZOLINE HCL 0.05 % NA SOLN
1.0000 | Freq: Once | NASAL | Status: AC
  Administered 2022-03-24: 1 via NASAL
  Filled 2022-03-24: qty 30

## 2022-03-24 MED ORDER — FENTANYL CITRATE PF 50 MCG/ML IJ SOSY: 25.0000 ug | PREFILLED_SYRINGE | INTRAMUSCULAR | Status: DC | PRN

## 2022-03-24 MED ORDER — FENTANYL 2500MCG IN NS 250ML (10MCG/ML) PREMIX INFUSION
25.0000 ug/h | INTRAVENOUS | Status: DC
  Administered 2022-03-25: 100 ug/h via INTRAVENOUS
  Administered 2022-03-25: 50 ug/h via INTRAVENOUS
  Filled 2022-03-24 (×2): qty 250

## 2022-03-24 MED ORDER — DOCUSATE SODIUM 50 MG/5ML PO LIQD
100.0000 mg | Freq: Two times a day (BID) | ORAL | Status: DC
  Administered 2022-03-25 (×2): 100 mg
  Filled 2022-03-24 (×2): qty 10

## 2022-03-24 MED ORDER — FENTANYL BOLUS VIA INFUSION
25.0000 ug | INTRAVENOUS | Status: DC | PRN
  Administered 2022-03-25: 50 ug via INTRAVENOUS
  Administered 2022-03-25: 25 ug via INTRAVENOUS
  Filled 2022-03-24: qty 100

## 2022-03-24 MED ORDER — PANTOPRAZOLE 2 MG/ML SUSPENSION
40.0000 mg | Freq: Every day | ORAL | Status: DC
  Administered 2022-03-25: 40 mg
  Filled 2022-03-24: qty 20

## 2022-03-24 MED ORDER — FENTANYL CITRATE PF 50 MCG/ML IJ SOSY
25.0000 ug | PREFILLED_SYRINGE | INTRAMUSCULAR | Status: DC | PRN
  Administered 2022-03-24: 100 ug via INTRAVENOUS
  Filled 2022-03-24: qty 2

## 2022-03-24 MED ORDER — ROCURONIUM BROMIDE 10 MG/ML (PF) SYRINGE
PREFILLED_SYRINGE | INTRAVENOUS | Status: AC
  Filled 2022-03-24: qty 10

## 2022-03-24 MED ORDER — SUCCINYLCHOLINE CHLORIDE 20 MG/ML IJ SOLN
INTRAMUSCULAR | Status: DC | PRN
  Administered 2022-03-24: 100 mg via INTRAVENOUS

## 2022-03-24 MED ORDER — DOCUSATE SODIUM 50 MG/5ML PO LIQD: 100.0000 mg | Freq: Two times a day (BID) | ORAL | Status: DC | PRN

## 2022-03-24 MED ORDER — POLYETHYLENE GLYCOL 3350 17 G PO PACK
17.0000 g | PACK | Freq: Every day | ORAL | Status: DC
  Administered 2022-03-25: 17 g
  Filled 2022-03-24: qty 1

## 2022-03-24 MED ORDER — POLYETHYLENE GLYCOL 3350 17 G PO PACK: 17.0000 g | PACK | Freq: Every day | ORAL | Status: DC | PRN

## 2022-03-24 MED ORDER — FENTANYL CITRATE PF 50 MCG/ML IJ SOSY: 25.0000 ug | PREFILLED_SYRINGE | Freq: Once | INTRAMUSCULAR | Status: DC

## 2022-03-24 MED ORDER — LORAZEPAM 1 MG PO TABS: 2.0000 mg | ORAL_TABLET | Freq: Once | ORAL | Status: DC

## 2022-03-24 MED ORDER — LORAZEPAM 2 MG/ML IJ SOLN
2.0000 mg | Freq: Once | INTRAMUSCULAR | Status: AC
  Administered 2022-03-24: 2 mg via INTRAVENOUS
  Filled 2022-03-24: qty 1

## 2022-03-24 MED ORDER — PROPOFOL 1000 MG/100ML IV EMUL
INTRAVENOUS | Status: AC
  Administered 2022-03-24: 20 ug/kg/min
  Filled 2022-03-24: qty 100

## 2022-03-24 MED ORDER — TRANEXAMIC ACID FOR EPISTAXIS
500.0000 mg | Freq: Once | TOPICAL | Status: AC
  Administered 2022-03-25: 500 mg via TOPICAL
  Filled 2022-03-24: qty 10

## 2022-03-24 MED ORDER — LORAZEPAM 1 MG PO TABS
0.5000 mg | ORAL_TABLET | Freq: Once | ORAL | Status: AC
  Administered 2022-03-24: 0.5 mg via ORAL
  Filled 2022-03-24: qty 1

## 2022-03-24 MED ORDER — ETOMIDATE 2 MG/ML IV SOLN
INTRAVENOUS | Status: AC
  Filled 2022-03-24: qty 20

## 2022-03-24 NOTE — ED Notes (Signed)
Discharge instructions reviewed with patient. Patient verbalized understanding of instructions. Follow-up care and medications were reviewed. Patient ambulatory with steady gait. VSS upon discharge.  ?

## 2022-03-24 NOTE — ED Notes (Signed)
Pt transferred to [facility] uncooperative, disoreinted.   ?

## 2022-03-24 NOTE — ED Provider Notes (Addendum)
?[facility] ?Provider Note ? ? ?CSN: [id number] ?Arrival date & time: 03/24/22  1800 ? ?  ? ?History ?Chief Complaint  ?Patient presents with  ? Epistaxis  ? ? ?Martha Mora is a 76 y.o. female. ? ?Patient with history of COPD on 2 L of oxygen, atrial fibrillation on Eliquis who presents with nosebleed.  Started about 15 to 20 minutes ago.  Had a brief nosebleed 2 days ago that stopped with direct pressure.  She states that she spontaneously started to have nosebleed while drinking soda.  Does wear oxygen all the time.  Does have humidification machine but has not started using it.  Denies any trauma.  Denies any chest pain or shortness of breath. ? ?The history is provided by the patient.  ? ?  ? ? ?Allergies    ?Keflex [cephalexin], Penicillins, Ropinirole hcl, Trazodone and nefazodone, and Shellfish allergy   ? ?Review of Systems   ?Review of Systems ? ?Physical Exam ?Updated Vital Signs ? ?Physical Exam ?Vitals and nursing note reviewed.  ?Constitutional:   ?   General: She is not in acute distress. ?   Appearance: She is well-developed.  ?HENT:  ?   Head: Normocephalic and atraumatic.  ?   Comments: Dried blood clots in both naris, no bleeding in the posterior oropharynx there is still some bleeding mildly in both nares ?   Mouth/Throat:  ?   Mouth: Mucous membranes are moist.  ?Eyes:  ?   Extraocular Movements: Extraocular movements intact.  ?   Conjunctiva/sclera: Conjunctivae normal.  ?   Pupils: Pupils are equal, round, and reactive to light.  ?Cardiovascular:  ?   Rate and Rhythm: Normal rate and regular rhythm.  ?   Heart sounds: No murmur heard. ?Pulmonary:  ?   Effort: Pulmonary effort is normal. No respiratory distress.  ?   Breath sounds: Normal breath sounds.  ?Abdominal:  ?   Palpations: Abdomen is soft.  ?   Tenderness: There is no abdominal tenderness.  ?Musculoskeletal:     ?   General: No swelling.  ?   Cervical back: Neck supple.  ?Skin: ?   General:  Skin is warm and dry.  ?   Capillary Refill: Capillary refill takes less than 2 seconds.  ?Neurological:  ?   Mental Status: She is alert.  ?Psychiatric:     ?   Mood and Affect: Mood normal.  ? ? ?ED Results / Procedures / Treatments   ?Labs ?(all labs ordered are listed, but only abnormal results are displayed) ?Labs Reviewed  ?CBC WITH DIFFERENTIAL/PLATELET  ?BASIC METABOLIC PANEL  ? ? ?EKG ?None ? ?Radiology ?No results found. ? ?Procedures ?.Epistaxis Management ? ?Date/Time: 03/24/2022 8:33 PM ?Performed by: Lennice Sites, DO ?Authorized by: Lennice Sites, DO  ? ?Consent:  ?  Consent obtained:  Verbal ?  Consent given by:  Patient ?  Risks, benefits, and alternatives were discussed: yes   ?  Risks discussed:  Bleeding, infection, nasal injury and pain ?  Alternatives discussed:  No treatment ?Universal protocol:  ?  Procedure explained and questions answered to patient or proxy's satisfaction: yes   ?  Relevant documents present and verified: yes   ?  Patient identity confirmed:  Verbally with patient ?Anesthesia:  ?  Anesthesia method:  None ?Procedure details:  ?  Treatment site:  L anterior ?  Treatment method:  Anterior pack ?  Treatment complexity:  Limited ?  Treatment episode: recurring   ?  Post-procedure details:  ?  Assessment:  Bleeding decreased ?  Procedure completion:  Tolerated  ? ?Medications Ordered in ED ?Medications  ?LORazepam (ATIVAN) injection 1 mg (has no administration in time range)  ?oxymetazoline (AFRIN) 0.05 % nasal spray 1 spray (1 spray Each Nare Given 03/24/22 1820)  ?oxymetazoline (AFRIN) 0.05 % nasal spray 1 spray (1 spray Each Nare Given 03/24/22 2123)  ?LORazepam (ATIVAN) tablet 0.5 mg (0.5 mg Oral Given 03/24/22 2122)  ? ? ?ED Course/ Medical Decision Making/ A&P ?  ?                        ?Medical Decision Making ?Amount and/or Complexity of Data Reviewed ?Labs: ordered. ? ?Risk ?OTC drugs. ?Prescription drug management. ?Decision regarding hospitalization. ? ? ?Martha Mora  is here with nosebleed.  On Eliquis for A-fib.  History of COPD on 2 L of oxygen.  Spontaneous nosebleed tonight.  Normal vitals.  No fever.  Had a nosebleed 2 days ago that stopped with direct pressure.  She denies any trauma.  She has large blood clots in both nares.  I had her blow out all of these clots.  We placed Afrin in both naris.  I was unable to see any brisk bleeding from either nare.  Pressure was held for 15 minutes and on recheck bleeding has now stopped.  There is a lot of mucosal irritation bilaterally in both nares.  She has not swallowed any blood since being here and overall it I do not have concern for posterior bleed.  We will monitor and see if I can find the source for the bleeding if there is a rebleed. ? ?Patient was observed for about 90 minutes after the nosebleed was stopped.  She felt comfortable going home.  We will have her take bottle Afrin.   ? ?Pt started to bleed again prior to d/c. ? ?Patient with pretty brisk bleeding from the left nares appeared.  There was some bleeding from the right nare but much faster on the left.  Decision was made to pack the left nare with a 7.5 Rhino with Afrin.  Bleeding has seemed to stop at this time but will continue to observe.  We will check basic labs. ? ?Still having some small bleeding from the right nare, passed a big clot.  But over the last hour or so bleeding seems to be slowing down to a minimum.  I did talk with ENT and given that she is on Eliquis, COPD here with nasal cannula need with some recurrent bleeding think that it is safe to observe her overnight make sure that she tolerates this packing.  Dr. Janace Hoard does state that sometimes it does take a little bit of time with Eliquis for clot formation and for packing to take hold.  Want to make sure she has a safe oxygen plan for home and that she is tolerating her packing well.  She had a lot of anxiety since the packing was placed but has improved with IV Ativan.  Will admit to medicine  service for further care.  Hemodynamically she stable.  Nasal cannula for the most part has been in her mouth ? ?This chart was dictated using voice recognition software.  Despite best efforts to proofread,  errors can occur which can change the documentation meaning.  ? ? ?Final Clinical Impression(s) / ED Diagnoses ?Final diagnoses:  ?Epistaxis  ? ? ?Rx / DC Orders ?ED Discharge Orders   ? ?  None  ? ?  ? ? ?  ?Lennice Sites, DO ?03/24/22 1932 ? ?  ?Lennice Sites, DO ?03/24/22 2033 ? ?  ?Lennice Sites, DO ?03/24/22 2132 ? ?

## 2022-03-24 NOTE — Progress Notes (Signed)
Family medicine teaching service will be admitting this patient. Our pager information can be located in the physician sticky notes, treatment team sticky notes, and the headers of all our official daily progress notes.   FAMILY MEDICINE TEACHING SERVICE Patient - Please contact intern pager [phone] or text page via website [email or web address] ([email or web address]) for questions regarding care. DO NOT page listed attending provider unless there is no answer from the number above.   Ralynn San, MD PGY-2, [place] [facility] Service pager [phone]   

## 2022-03-24 NOTE — ED Notes (Signed)
Son has agreed that pt can be intubated for her safety. ?

## 2022-03-24 NOTE — Progress Notes (Addendum)
FPTS Consult Note ? ?S: Went to ED to admit patient to [place] service due to epistaxis on chronic anticoagulation and ENT recommended observation. Patient was very anxious in the room and unable to sit still. Nursing had let me know patient had gotten 3.5 mg of ativan total-2122 (0.5) 2135 (1 mg) and 2237 (2 mg). Patient complained of it being very hot and she was very restless wanting to stand up.  While moving her IV was pulled out and she was bleeding from IV insertion site. Her nose was packed with rhino rocket and patient was still bleeding moderately from it. She was oriented to person and place but could not answer year or situation while I was in the room. Was tachycardic and tachypneic and diaphoretic while I was in the room. ABG was obtained. CBC ordered. Patient was transported to different ED room. Dr. Karle Starch was able to place [facility] in left nares and bled through in 2 minutes. ENT Dr. Janace Hoard was called for evaluation of this bleed-on their way. Patient was acutely disoriented and agitated to only self. Patient's son Legrand Como was Media planner and said it was okay to intubate given patient was unable to keep mask on/protect airway. CCM paged. Intubation performed by Dr. Karle Starch in the ED. CCM came to room and will admit patient to their service. ? ?O: ?BP (!) 102/58   Pulse (!) 108   Temp 98.1 ?F (36.7 ?C) (Oral)   Resp (!) 100   SpO2 100%   ? ? ?A/P: ?-CCM to take over care ?-ENT to evaluate patient ?-FPTS will take over care when no longer requiring ICU level care ? ?Gerrit Heck, MD ?03/24/2022, 11:30 PM ?PGY-1, [place] ?Service pager [phone]  ?

## 2022-03-24 NOTE — Discharge Instructions (Addendum)
Dear Martha Mora,   Thank you so much for allowing [place] to be part of your care!  You were admitted to [facility] for a nosebleed and had to go to the ICU to keep your breathing under control.    POST-HOSPITAL & CARE INSTRUCTIONS Please use a humidified oxygen source and for your nasal cavity is moisturized Please let PCP/Specialists know of any changes that were made.  Please see medications section of this packet for any medication changes.  4.   Your PCP will order a low dose CT scan of your lungs to follow up on a nodule.  5. Make an appointment to see your cardiologist and pulmonologist in follow up.   RETURN PRECAUTIONS: If you start having issues with breathing or bleeding out of your nose again  DOCTOR'S APPOINTMENT   Future Appointments  Date Time Provider [place]  04/09/2022  9:50 AM [name], DO [facility] [place]  04/26/2022  9:50 AM [name], DO [facility] [place]  10/24/2022 10:00 AM [facility] None  10/24/2022 10:30 AM Alla Feeling, NP [facility] None    Follow-up Information     [facility] .   Specialty: Emergency Medicine Contact information: [address] [id number] [place]        Melissa Montane, MD .   Specialty: Otolaryngology Contact information: [address] [place] [address] [place] [address] [phone]         [name], DO. Go on 04/09/2022.   Specialty: Family Medicine Why: 9:50 AM Contact information: [address] [address] [phone]         [name], DO. Go on 04/26/2022.   Specialty: Family Medicine Why: At 9:50 am. Please arrive by 9:35 am. This is your two week follow up with your PCP to check in on any bleeding after two weeks of Eliquis and aspirin. Contact information: [address] [address] [phone]                Take care and be  well!  [facility]  [place],  [phone]

## 2022-03-24 NOTE — ED Provider Notes (Signed)
Called to bedside at request of admitting service for evaluation. Patient admitted for recurrent nose bleed on Eliquis. Had resolved spontaneously initially discharged, but bleeding returned before she left and so she had a rapid rhino placed with hemostasis and admitted. In the meantime, she has begun bleeding again and has been more agitated. She has been given multiple doses of Ativan but continues to pull at lines, packing and monitor leads. Trying to get out of bed and not cooperating with staff.  ? ?I replaced Rapid Rhino with a long Merocel but bleeding continued. After discussion with son at bedside, the decision was made to intubate for airway protection and to facilitate management of bleeding.  ? ?FM Residents at bedside have spoken with Dr. Janace Hoard, ENT who will come evaluate the patient and with PCCM who will come admit to ICU. PCCM team has ordered reversal agents for her coagulopathy.  ? ?.Epistaxis Management ? ?Date/Time: 03/24/2022 11:38 PM ?Performed by: Truddie Hidden, MD ?Authorized by: Truddie Hidden, MD  ? ?Consent:  ?  Consent obtained:  Emergent situation ?Procedure details:  ?  Treatment site:  Unable to specify ?  Treatment method:  Merocel sponge ?  Treatment complexity:  Extensive ?  Treatment episode: recurring   ?Post-procedure details:  ?  Assessment:  No improvement ?Procedure Name: Intubation ?Date/Time: 03/24/2022 11:39 PM ?Performed by: Truddie Hidden, MD ?Pre-anesthesia Checklist: Patient identified, Patient being monitored, Emergency Drugs available and Suction available ?Oxygen Delivery Method: Non-rebreather mask ?Preoxygenation: Pre-oxygenation with 100% oxygen ?Induction Type: Rapid sequence ?Ventilation: Mask ventilation without difficulty ?Laryngoscope Size: Glidescope and 3 ?Grade View: Grade I ?Tube size: 7.5 mm ?Number of attempts: 1 ?Placement Confirmation: ETT inserted through vocal cords under direct vision, CO2 detector and Breath sounds checked- equal and  bilateral ?Secured at: 25 cm ?Tube secured with: ETT holder ? ? ? ?.Critical Care ?Performed by: Truddie Hidden, MD ?Authorized by: Truddie Hidden, MD  ? ?Critical care provider statement:  ?  Critical care time (minutes):  35 ?  Critical care time was exclusive of:  Separately billable procedures and treating other patients ?  Critical care was time spent personally by me on the following activities:  Development of treatment plan with patient or surrogate, discussions with consultants, evaluation of patient's response to treatment, examination of patient, ordering and review of laboratory studies, ordering and review of radiographic studies, ordering and performing treatments and interventions, pulse oximetry, re-evaluation of patient's condition and review of old charts ?  I assumed direction of critical care for this patient from another provider in my specialty: yes   ?  Care discussed with: admitting provider   ? ?  ?Truddie Hidden, MD ?03/24/22 2341 ? ?
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
## 2022-03-24 NOTE — ED Notes (Signed)
Scot Jun son [phone] call with any changes and when she gets a bed ?

## 2022-03-24 NOTE — ED Triage Notes (Signed)
Pt to ER via EMS form home with c/o nosebleed.  Pt reports bleeding for approximately 15 mins two days ago that stopped on its own.  Today it has been bleeding for at least 13mns and continues on arrival. Pt uses oxygen at home and has recently been delivered humidification, but has not started using it.  Pt takes Eliquis and has HTN. ?

## 2022-03-25 ENCOUNTER — Ambulatory Visit: Payer: Medicare Other

## 2022-03-25 ENCOUNTER — Inpatient Hospital Stay (HOSPITAL_COMMUNITY): Payer: Medicare Other

## 2022-03-25 DIAGNOSIS — J449 Chronic obstructive pulmonary disease, unspecified: Secondary | ICD-10-CM

## 2022-03-25 DIAGNOSIS — R040 Epistaxis: Secondary | ICD-10-CM | POA: Diagnosis not present

## 2022-03-25 DIAGNOSIS — G9340 Encephalopathy, unspecified: Secondary | ICD-10-CM | POA: Diagnosis present

## 2022-03-25 DIAGNOSIS — D72829 Elevated white blood cell count, unspecified: Secondary | ICD-10-CM | POA: Insufficient documentation

## 2022-03-25 DIAGNOSIS — J9600 Acute respiratory failure, unspecified whether with hypoxia or hypercapnia: Secondary | ICD-10-CM | POA: Diagnosis not present

## 2022-03-25 DIAGNOSIS — Z7901 Long term (current) use of anticoagulants: Secondary | ICD-10-CM | POA: Diagnosis not present

## 2022-03-25 DIAGNOSIS — J9691 Respiratory failure, unspecified with hypoxia: Secondary | ICD-10-CM | POA: Diagnosis present

## 2022-03-25 LAB — GLUCOSE, CAPILLARY
Glucose-Capillary: 164 mg/dL — ABNORMAL HIGH (ref 70–99)
Glucose-Capillary: 152 mg/dL — ABNORMAL HIGH (ref 70–99)
Glucose-Capillary: 136 mg/dL — ABNORMAL HIGH (ref 70–99)
Glucose-Capillary: 150 mg/dL — ABNORMAL HIGH (ref 70–99)

## 2022-03-25 LAB — POCT I-STAT 7, (LYTES, BLD GAS, ICA,H+H)
Acid-Base Excess: 3 mmol/L — ABNORMAL HIGH (ref 0.0–2.0)
Acid-base deficit: 1 mmol/L (ref 0.0–2.0)
Bicarbonate: 28.1 mmol/L — ABNORMAL HIGH (ref 20.0–28.0)
Bicarbonate: 30.3 mmol/L — ABNORMAL HIGH (ref 20.0–28.0)
Calcium, Ion: 1.23 mmol/L (ref 1.15–1.40)
Calcium, Ion: 1.26 mmol/L (ref 1.15–1.40)
HCT: 33 % — ABNORMAL LOW (ref 36.0–46.0)
HCT: 47 % — ABNORMAL HIGH (ref 36.0–46.0)
Hemoglobin: 11.2 g/dL — ABNORMAL LOW (ref 12.0–15.0)
Hemoglobin: 16 g/dL — ABNORMAL HIGH (ref 12.0–15.0)
O2 Saturation: 90 %
O2 Saturation: 99 %
Patient temperature: 98.2
Patient temperature: 98.6
Potassium: 4.2 mmol/L (ref 3.5–5.1)
Potassium: 4.5 mmol/L (ref 3.5–5.1)
Sodium: 139 mmol/L (ref 135–145)
Sodium: 139 mmol/L (ref 135–145)
TCO2: 30 mmol/L (ref 22–32)
TCO2: 32 mmol/L (ref 22–32)
pCO2 arterial: 59.8 mmHg — ABNORMAL HIGH (ref 32–48)
pCO2 arterial: 63.4 mmHg — ABNORMAL HIGH (ref 32–48)
pH, Arterial: 7.255 — ABNORMAL LOW (ref 7.35–7.45)
pH, Arterial: 7.311 — ABNORMAL LOW (ref 7.35–7.45)
pO2, Arterial: 158 mmHg — ABNORMAL HIGH (ref 83–108)
pO2, Arterial: 65 mmHg — ABNORMAL LOW (ref 83–108)

## 2022-03-25 LAB — BASIC METABOLIC PANEL
Anion gap: 12 (ref 5–15)
BUN: 28 mg/dL — ABNORMAL HIGH (ref 8–23)
CO2: 21 mmol/L — ABNORMAL LOW (ref 22–32)
Calcium: 8.6 mg/dL — ABNORMAL LOW (ref 8.9–10.3)
Chloride: 107 mmol/L (ref 98–111)
Creatinine, Ser: 1.07 mg/dL — ABNORMAL HIGH (ref 0.44–1.00)
GFR, Estimated: 53 mL/min — ABNORMAL LOW (ref >60)
Glucose, Bld: 149 mg/dL — ABNORMAL HIGH (ref 70–99)
Potassium: 5 mmol/L (ref 3.5–5.1)
Sodium: 140 mmol/L (ref 135–145)

## 2022-03-25 LAB — URINALYSIS, ROUTINE W REFLEX MICROSCOPIC
Bilirubin Urine: NEGATIVE
Glucose, UA: NEGATIVE mg/dL
Hgb urine dipstick: NEGATIVE
Ketones, ur: NEGATIVE mg/dL
Leukocytes,Ua: NEGATIVE
Nitrite: NEGATIVE
Protein, ur: 30 mg/dL — AB
Specific Gravity, Urine: 1.025 (ref 1.005–1.030)
pH: 5 (ref 5.0–8.0)

## 2022-03-25 LAB — RAPID URINE DRUG SCREEN, HOSP PERFORMED
Amphetamines: NOT DETECTED
Barbiturates: NOT DETECTED
Benzodiazepines: POSITIVE — AB
Cocaine: NOT DETECTED
Opiates: NOT DETECTED
Tetrahydrocannabinol: NOT DETECTED

## 2022-03-25 LAB — HEMOGLOBIN AND HEMATOCRIT, BLOOD
HCT: 35.3 % — ABNORMAL LOW (ref 36.0–46.0)
HCT: 36.8 % (ref 36.0–46.0)
HCT: 35.1 % — ABNORMAL LOW (ref 36.0–46.0)
Hemoglobin: 11 g/dL — ABNORMAL LOW (ref 12.0–15.0)
Hemoglobin: 11 g/dL — ABNORMAL LOW (ref 12.0–15.0)
Hemoglobin: 10.8 g/dL — ABNORMAL LOW (ref 12.0–15.0)

## 2022-03-25 LAB — CBC
HCT: 35.6 % — ABNORMAL LOW (ref 36.0–46.0)
Hemoglobin: 11 g/dL — ABNORMAL LOW (ref 12.0–15.0)
MCH: 30.8 pg (ref 26.0–34.0)
MCHC: 30.9 g/dL (ref 30.0–36.0)
MCV: 99.7 fL (ref 80.0–100.0)
Platelets: 238 10*3/uL (ref 150–400)
RBC: 3.57 MIL/uL — ABNORMAL LOW (ref 3.87–5.11)
RDW: 14 % (ref 11.5–15.5)
WBC: 16.6 10*3/uL — ABNORMAL HIGH (ref 4.0–10.5)
nRBC: 0 % (ref 0.0–0.2)

## 2022-03-25 LAB — MAGNESIUM: Magnesium: 1.8 mg/dL (ref 1.7–2.4)

## 2022-03-25 LAB — MRSA NEXT GEN BY PCR, NASAL: MRSA by PCR Next Gen: NOT DETECTED

## 2022-03-25 LAB — APTT: aPTT: 31 seconds (ref 24–36)

## 2022-03-25 LAB — SALICYLATE LEVEL: Salicylate Lvl: <7 mg/dL — ABNORMAL LOW (ref 7.0–30.0)

## 2022-03-25 LAB — ACETAMINOPHEN LEVEL: Acetaminophen (Tylenol), Serum: <10 ug/mL — ABNORMAL LOW (ref 10–30)

## 2022-03-25 LAB — PROTIME-INR
INR: 1.1 (ref 0.8–1.2)
Prothrombin Time: 14.2 seconds (ref 11.4–15.2)

## 2022-03-25 LAB — ETHANOL: Alcohol, Ethyl (B): <10 mg/dL (ref <10)

## 2022-03-25 LAB — PROCALCITONIN: Procalcitonin: 0.16 ng/mL

## 2022-03-25 MED ORDER — FENTANYL CITRATE (PF) 100 MCG/2ML IJ SOLN: 25.0000 ug | INTRAMUSCULAR | Status: DC | PRN

## 2022-03-25 MED ORDER — FENTANYL CITRATE (PF) 100 MCG/2ML IJ SOLN: 25.0000 ug | Freq: Once | INTRAMUSCULAR | Status: DC

## 2022-03-25 MED ORDER — IPRATROPIUM-ALBUTEROL 0.5-2.5 (3) MG/3ML IN SOLN
3.0000 mL | RESPIRATORY_TRACT | Status: DC | PRN
  Administered 2022-03-30 – 2022-03-31 (×4): 3 mL via RESPIRATORY_TRACT
  Filled 2022-03-25 (×4): qty 3

## 2022-03-25 MED ORDER — LACTATED RINGERS IV BOLUS
500.0000 mL | Freq: Once | INTRAVENOUS | Status: AC
  Administered 2022-03-25: 500 mL via INTRAVENOUS

## 2022-03-25 MED ORDER — CHLORHEXIDINE GLUCONATE CLOTH 2 % EX PADS
6.0000 | MEDICATED_PAD | Freq: Every day | CUTANEOUS | Status: DC
  Administered 2022-03-25 – 2022-04-02 (×9): 6 via TOPICAL

## 2022-03-25 MED ORDER — ROSUVASTATIN CALCIUM 20 MG PO TABS
20.0000 mg | ORAL_TABLET | Freq: Every day | ORAL | Status: DC
  Administered 2022-03-25: 20 mg
  Filled 2022-03-25: qty 1

## 2022-03-25 MED ORDER — CHLORHEXIDINE GLUCONATE 0.12% ORAL RINSE (MEDLINE KIT)
15.0000 mL | Freq: Two times a day (BID) | OROMUCOSAL | Status: DC
  Administered 2022-03-25 – 2022-03-29 (×9): 15 mL via OROMUCOSAL

## 2022-03-25 MED ORDER — LACTATED RINGERS IV SOLN: INTRAVENOUS | Status: DC

## 2022-03-25 MED ORDER — SODIUM CHLORIDE 0.9 % IV SOLN: INTRAVENOUS | Status: DC | PRN

## 2022-03-25 MED ORDER — NICOTINE 7 MG/24HR TD PT24
7.0000 mg | MEDICATED_PATCH | Freq: Every day | TRANSDERMAL | Status: DC
  Administered 2022-03-25 – 2022-04-08 (×15): 7 mg via TRANSDERMAL
  Filled 2022-03-25 (×15): qty 1

## 2022-03-25 MED ORDER — AMIODARONE HCL 200 MG PO TABS
100.0000 mg | ORAL_TABLET | Freq: Every day | ORAL | Status: DC
  Administered 2022-03-25 – 2022-03-26 (×2): 100 mg
  Filled 2022-03-25 (×2): qty 1

## 2022-03-25 MED ORDER — LABETALOL HCL 5 MG/ML IV SOLN
5.0000 mg | INTRAVENOUS | Status: DC | PRN
  Administered 2022-03-31: 10 mg via INTRAVENOUS
  Administered 2022-03-31 – 2022-04-01 (×3): 20 mg via INTRAVENOUS
  Filled 2022-03-25 (×4): qty 4

## 2022-03-25 MED ORDER — ORAL CARE MOUTH RINSE
15.0000 mL | OROMUCOSAL | Status: DC
  Administered 2022-03-25 – 2022-03-26 (×12): 15 mL via OROMUCOSAL

## 2022-03-25 NOTE — Progress Notes (Addendum)
eLink Physician-Brief Progress Note ?Patient Name: Martha Mora ?DOB: July 20, 1946 ?MRN: [id number] ? ? ?Date of Service ? 03/25/2022  ?HPI/Events of Note ? 89 F, COPD, OSA, a fib, CAD, Depression, breast cancer admitted for epistaxis. On Ventilator. ENT seen her already, notes reviewed. ?PA H and P reviewed.  ? ?Camera: ?Obese. In synchrony with vent. ?HR 76, 330/5/24. PIP 16. ?91% sats. MAP 67. ?On fenta/propofol gtt. ?Discussed with RN.  ? ?Data: ?Reviewed ?WBC 15.7, Hgb 12.2, platelets 279.  ABG: 7.41, 40, 59, 25.  CXR: personally reviewed ETT about 3 cm above carina. Basal atelectasis vs effusion bi basal. Air space densities could not be ruled out.  ?ABG: allow permissive hypercapnea for now. Wean fio2 as tolerated to keep sats > 90%.  ? ? ?Epistaxis, s/p ENT cauterization and balloon pressure dressing.  ?Acute on chronic type 2 resp failure, on Vent.  ?Encephalopathy ?HTN/A fib/CAD/HFpEF ?  ?eICU Interventions ? - eliquis on hold. Received Txa. ?- continue nebs. No steroids.  ?- VTE: SCD, on PPI as SUP ?- CBG goals < 180. ?- hold CAD meds-asa for now. Hold BP meds ?- keep MAP < 70 if possible. ?- labetolol prn ?- mostly has reactive leukocytosis. No antibiotics. Watch for pneumonitis- fever, worsening leukocytosis .  ?- follow tox screen, pro cal. If pro cal elevated, will need unasyn abx for ? Asp pneumonitis.   ? ? ? ?Intervention Category ?Intermediate Interventions: Bleeding - evaluation and treatment with blood products ?Evaluation Type: New Patient Evaluation ? ?Elmer Sow ?03/25/2022, 12:48 AM ? ?

## 2022-03-25 NOTE — TOC Progression Note (Signed)
Transition of Care (TOC) - Initial/Assessment Note  ? ? ?Patient Details  ?Name: Martha Mora ?MRN: [id number] ?Date of Birth: 06-05-1946 ? ?Transition of Care (TOC) CM/SW Contact:    ?Paulene Floor Mithran Strike, LCSWA ?Phone Number: ?03/25/2022, 4:55 PM ? ?Clinical Narrative:                 ?Patient admitted for epistaxis and is currently on the ventilator.  CSW spoke with the patient's daughter, [name], who reports that the patient is from home alone.  Prior to admission, the patient was able to mobilize with the use of a cane and was completing ADLs independently.  The patient was driving self to appointments.  The patient has received home health services after previous hospital admissions, but no services are currently active.  Patient currently has a cane, bedside commode, and walker at the home.  The daughter could not remember the name of the home health agency or DME providers.  ? ?TOC following patient for any d/c planning needs once medically stable.   ? ?Lind Covert, MSW, LCSWA   ? ?  ?  ? ? ?Patient Goals and CMS Choice ?  ?  ?  ? ?Expected Discharge Plan and Services ?  ?  ?  ?  ?  ?                ?  ?  ?  ?  ?  ?  ?  ?  ?  ?  ? ?Prior Living Arrangements/Services ?  ?  ?  ?       ?  ?  ?  ?  ? ?Activities of Daily Living ?  ?ADL Screening (condition at time of admission) ?Patient's cognitive ability adequate to safely complete daily activities?: No ?Is the patient deaf or have difficulty hearing?: No ?Does the patient have difficulty seeing, even when wearing glasses/contacts?: No ?Does the patient have difficulty concentrating, remembering, or making decisions?: No ?Patient able to express need for assistance with ADLs?: No ?Does the patient have difficulty dressing or bathing?: Yes ?Independently performs ADLs?: No ?Does the patient have difficulty walking or climbing stairs?: Yes ?Weakness of Legs: Both ?Weakness of Arms/Hands: Both ? ?Permission Sought/Granted ?  ?  ?   ?   ?   ?   ? ?Emotional  Assessment ?  ?  ?  ?  ?  ?  ? ?Admission diagnosis:  Epistaxis [R04.0] ?Nosebleed [R04.0] ?Patient Active Problem List  ? Diagnosis Date Noted  ? Respiratory failure with hypoxia and hypercapnia ([place]) 03/25/2022  ? Leukocytosis   ? Epistaxis requiring cauterization 03/24/2022  ? Chronic heart failure with preserved ejection fraction ([place]) 01/10/2022  ? SCC (squamous cell carcinoma), hand, right 05/17/2021  ? Actinic keratoses 03/29/2021  ? Dysuria 03/16/2021  ? 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 Genetic testing 02/12/2021  ? Chronic obstructive pulmonary disease ([place]) 02/05/2021  ? Lumbar radiculopathy 02/05/2021  ? Atrial fibrillation ([place]) 01/09/2021  ? Vitamin D deficiency 01/09/2021  ? Seborrheic keratoses 01/09/2021  ? Urinary urgency 11/21/2020  ? Vertigo 06/12/2020  ? Vocal cord dysfunction 06/12/2020  ? Chronic respiratory failure ([place]) 06/12/2020  ? 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 07/16/2019  ? Renal stones 03/15/2019  ? Insomnia 09/08/2016  ? Restless leg syndrome 05/24/2015  ? History of tobacco use 05/23/2014  ? Mitral valve regurgitation 05/23/2014  ? Dysfunctional uterine bleeding 09/02/2013  ? Breast cancer of lower-outer quadrant of right female breast ([place]) 07/24/2013  ? Postmenopausal bleeding  07/14/2013  ? Osteopenia 12/27/2011  ? Anxiety 07/19/2009  ? Osteoarthritis 07/19/2009  ? IRRITABLE BOWEL SYNDROME 11/17/2008  ? COLONIC POLYPS, ADENOMATOUS 11/17/2007  ? Hyperlipidemia 11/17/2007  ? Obesity, unspecified 11/17/2007  ? Depression 11/17/2007  ? Essential hypertension 11/17/2007  ? Coronary atherosclerosis 11/17/2007  ? HEMORRHOIDS, INTERNAL 11/17/2007  ? GERD 11/17/2007  ? OSA (obstructive sleep apnea) 11/17/2007  ? Diverticulosis of colon (without mention of hemorrhage) 09/05/2005  ? ESOPHAGEAL STRICTURE 06/01/2002  ? ?PCP:  [name], DO ?Pharmacy:   ?[id number]- [place], [place] - [address] AT [id number]?Phone: [phone]: [phone]? ?[facility]?Phone: [phone]: [phone] ? ? ? ? ?Social Determinants of Health (SDOH) Interventions ?  ? ?Readmission Risk Interventions ? ?  03/25/2022  ?  4:51 PM 01/02/2022  ? 11:24 AM 11/26/2019  ?  1:27 PM  ?Readmission Risk Prevention Plan  ?Transportation Screening Complete Complete Complete  ?PCP or Specialist Appt within 5-7 Days  Complete   ?PCP or Specialist Appt within 3-5 Days Not Complete  Complete  ?Not Complete comments Patient still urrently in ICU    ?Home Care Screening  Complete   ?Medication Review (RN CM)  Complete   ?[facility] Consult Complete  Complete  ?Social Work Consult for RValmeyerPlanning/Counseling Complete  Complete  ?Palliative Care Screening Not Applicable  Not Applicable  ?Medication Review (RN Care Manager) Referral to Pharmacy  Complete  ? ? ? ?

## 2022-03-25 NOTE — Progress Notes (Signed)
Dr. Lynetta Mare was notified of patient's soft BP unless being stimulated and also low urine output. Orders for bolus and IVF's received. ?

## 2022-03-25 NOTE — Assessment & Plan Note (Addendum)
No obvious bleeding at this time.  Epistat deflated and removed. ? ?-Monitor for clinical bleeding, but appears resolved.  ?-Nasal saline for moisturization. ?

## 2022-03-25 NOTE — Progress Notes (Signed)
AT bedside to assess PIV. Redressed and adjusted catheter with success. Flushes easily with good blood return. RN aware. ?
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
## 2022-03-25 NOTE — Assessment & Plan Note (Addendum)
Hypercarbic on arrival .  Possible nonadherence to BiPAP.  Possible oversedation from medication. ? ?-Will need nocturnal CPAP post extubation if can tolerate.  Currently on to high flow oxygen for CPAP use. ?

## 2022-03-25 NOTE — Assessment & Plan Note (Addendum)
Home antidepressants currently on hold. ? ?-Resume home psychiatric medications. ?

## 2022-03-25 NOTE — Plan of Care (Signed)
Patient admitted to MICU following endotracheal intubation in ED. Admitting diagnosis of "Nosebleed". Patient is intubated and sedated, Epistat to left nare, pure wick, OGT to LIS, PIV access only, SCDs.  ? ? ?Problem: Education: ?Goal: Knowledge of General Education information will improve ?Description: Including pain rating scale, medication(s)/side effects and non-pharmacologic comfort measures ?Outcome: Progressing ?  ?Problem: Health Behavior/Discharge Planning: ?Goal: Ability to manage health-related needs will improve ?Outcome: Progressing ?  ?Problem: Clinical Measurements: ?Goal: Ability to maintain clinical measurements within normal limits will improve ?Outcome: Progressing ?Goal: Will remain free from infection ?Outcome: Progressing ?Goal: Diagnostic test results will improve ?Outcome: Progressing ?Goal: Respiratory complications will improve ?Outcome: Progressing ?Goal: Cardiovascular complication will be avoided ?Outcome: Progressing ?  ?Problem: Activity: ?Goal: Risk for activity intolerance will decrease ?Outcome: Progressing ?  ?Problem: Nutrition: ?Goal: Adequate nutrition will be maintained ?Outcome: Progressing ?  ?Problem: Coping: ?Goal: Level of anxiety will decrease ?Outcome: Progressing ?  ?Problem: Elimination: ?Goal: Will not experience complications related to bowel motility ?Outcome: Progressing ?Goal: Will not experience complications related to urinary retention ?Outcome: Progressing ?  ?Problem: Pain Managment: ?Goal: General experience of comfort will improve ?Outcome: Progressing ?  ?Problem: Safety: ?Goal: Ability to remain free from injury will improve ?Outcome: Progressing ?  ?Problem: Skin Integrity: ?Goal: Risk for impaired skin integrity will decrease ?Outcome: Progressing ?  ?
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
## 2022-03-25 NOTE — Progress Notes (Signed)
Initial Nutrition Assessment ? ?DOCUMENTATION CODES:  ? ?Not applicable ? ?INTERVENTION:  ? ?Recommend initiation of enteral nutrition via OG tube: ?- Vital 1.5 @ 45 ml/hr (1080 ml/day) ?- ProSource TF 45 ml BID ? ?Recommended tube feeding regimen would provide 1700 kcal, 95 grams of protein, and 825 ml of H2O.  ? ?NUTRITION DIAGNOSIS:  ? ?Inadequate oral intake related to inability to eat as evidenced by NPO status. ? ?GOAL:  ? ?Patient will meet greater than or equal to 90% of their needs ? ?MONITOR:  ? ?Vent status, Labs, Weight trends ? ?REASON FOR ASSESSMENT:  ? ?Ventilator ?  ? ?ASSESSMENT:  ? ?76 year old female who presented to the ED on 5/07 with epistaxis. PMH of COPD, atrial fibrillation, anxiety, depression, HTN, HLD, CAD, GERD. Pt required intubation in the ED due to anxiety and SOB. ? ?Reached out to CCM MD regarding initiation of enteral nutrition via OG tube which is currently in place to low intermittent suction. No response received. RD to leave tube feeding recommendations. OG tube terminates near pylorus per abdominal x-ray. ? ?Unable to obtain diet and weight history at this time. Reviewed weight history in chart. Weight trending up slightly over the last year. No evidence of weight loss. ? ?Patient is currently intubated on ventilator support ?MV: 7.7 L/min ?Temp (24hrs), Avg:98.1 ?F (36.7 ?C), Min:97.7 ?F (36.5 ?C), Max:98.5 ?F (36.9 ?C) ? ?Drips: ?Fentanyl ? ?Medications reviewed and include: colace, protonix, miralax ? ?Labs reviewed: BUN 28, creatinine 1.07, WBC 16.6 ?CBG's: 152-164 ? ?NUTRITION - FOCUSED PHYSICAL EXAM: ? ?Unable to complete at this time. RD working remotely. ? ?Diet Order:   ?Diet Order   ? ?       ?  Diet NPO time specified  Diet effective now       ?  ? ?  ?  ? ?  ? ? ?EDUCATION NEEDS:  ? ?No education needs have been identified at this time ? ?Skin:  Skin Assessment: Reviewed RN Assessment (MASD abdomen) ? ?Last BM:  no documented BM ? ?Height:  ? ?Ht Readings from Last  1 Encounters:  ?03/25/22 '5\' 1"'$  (1.549 m)  ? ? ?Weight:  ? ?Wt Readings from Last 1 Encounters:  ?03/25/22 108.2 kg  ? ? ?Ideal Body Weight:  47.7 kg ? ?BMI:  Body mass index is 45.07 kg/m?. ? ?Estimated Nutritional Needs:  ? ?Kcal:  1600-1800 ? ?Protein:  90-105 grams ? ?Fluid:  1.6-1.8 L ? ? ? ?Gustavus Bryant, MS, RD, LDN ?Inpatient Clinical Dietitian ?Please see AMiON for contact information. ? ?

## 2022-03-25 NOTE — Assessment & Plan Note (Addendum)
Previously intubated for agitation and hypoventilation.  Has been on heated high flow since self extubation.  Suspect combination of aspiration during initial episode of epistaxis plus component of volume overload at this time on possible background of COPD ? ?-Wean to, keep saturation greater than 88%. ?-No evidence of bronchospasm at this time. ?-Gentle diuresis ?-Progressive ambulation. ?-Incentive spirometry. ?

## 2022-03-25 NOTE — Progress Notes (Signed)
Pt transported from ED RES to 6E15 with no complications.  ?

## 2022-03-25 NOTE — Progress Notes (Signed)
Informed Dr. Redmond Baseman about epistat balloon falling out. Pt currently not having epistaxis.  Was informed to leave out for now and call back if bleeding resumes.   ?Irven [place], RN ?   ?

## 2022-03-25 NOTE — Assessment & Plan Note (Signed)
On apixaban at home.  Currently in sinus rhythm on oral amiodarone. ? ?-Continue amiodarone. ?-Hold apixaban for at least 1 week prior to rechallenge. ?

## 2022-03-26 ENCOUNTER — Inpatient Hospital Stay (HOSPITAL_COMMUNITY): Payer: Medicare Other

## 2022-03-26 ENCOUNTER — Ambulatory Visit: Payer: Medicare Other

## 2022-03-26 DIAGNOSIS — N179 Acute kidney failure, unspecified: Secondary | ICD-10-CM | POA: Diagnosis not present

## 2022-03-26 DIAGNOSIS — Z7901 Long term (current) use of anticoagulants: Secondary | ICD-10-CM | POA: Diagnosis not present

## 2022-03-26 DIAGNOSIS — R040 Epistaxis: Secondary | ICD-10-CM | POA: Diagnosis not present

## 2022-03-26 LAB — CBC WITH DIFFERENTIAL/PLATELET
Abs Immature Granulocytes: 0.16 10*3/uL — ABNORMAL HIGH (ref 0.00–0.07)
Basophils Absolute: 0.1 10*3/uL (ref 0.0–0.1)
Basophils Relative: 1 %
Eosinophils Absolute: 0 10*3/uL (ref 0.0–0.5)
Eosinophils Relative: 0 %
HCT: 32.1 % — ABNORMAL LOW (ref 36.0–46.0)
Hemoglobin: 9.6 g/dL — ABNORMAL LOW (ref 12.0–15.0)
Immature Granulocytes: 1 %
Lymphocytes Relative: 7 %
Lymphs Abs: 1.6 10*3/uL (ref 0.7–4.0)
MCH: 30.7 pg (ref 26.0–34.0)
MCHC: 29.9 g/dL — ABNORMAL LOW (ref 30.0–36.0)
MCV: 102.6 fL — ABNORMAL HIGH (ref 80.0–100.0)
Monocytes Absolute: 2.4 10*3/uL — ABNORMAL HIGH (ref 0.1–1.0)
Monocytes Relative: 10 %
Neutro Abs: 19.3 10*3/uL — ABNORMAL HIGH (ref 1.7–7.7)
Neutrophils Relative %: 81 %
Platelets: 233 10*3/uL (ref 150–400)
RBC: 3.13 MIL/uL — ABNORMAL LOW (ref 3.87–5.11)
RDW: 14.6 % (ref 11.5–15.5)
WBC: 23.6 10*3/uL — ABNORMAL HIGH (ref 4.0–10.5)
nRBC: 0.1 % (ref 0.0–0.2)

## 2022-03-26 LAB — GLUCOSE, CAPILLARY
Glucose-Capillary: 157 mg/dL — ABNORMAL HIGH (ref 70–99)
Glucose-Capillary: 160 mg/dL — ABNORMAL HIGH (ref 70–99)
Glucose-Capillary: 163 mg/dL — ABNORMAL HIGH (ref 70–99)
Glucose-Capillary: 161 mg/dL — ABNORMAL HIGH (ref 70–99)
Glucose-Capillary: 139 mg/dL — ABNORMAL HIGH (ref 70–99)

## 2022-03-26 LAB — BASIC METABOLIC PANEL
Anion gap: 6 (ref 5–15)
BUN: 43 mg/dL — ABNORMAL HIGH (ref 8–23)
CO2: 27 mmol/L (ref 22–32)
Calcium: 8.3 mg/dL — ABNORMAL LOW (ref 8.9–10.3)
Chloride: 107 mmol/L (ref 98–111)
Creatinine, Ser: 3.11 mg/dL — ABNORMAL HIGH (ref 0.44–1.00)
GFR, Estimated: 15 mL/min — ABNORMAL LOW (ref >60)
Glucose, Bld: 153 mg/dL — ABNORMAL HIGH (ref 70–99)
Potassium: 4.8 mmol/L (ref 3.5–5.1)
Sodium: 140 mmol/L (ref 135–145)

## 2022-03-26 LAB — URINE CULTURE: Culture: NO GROWTH

## 2022-03-26 MED ORDER — NOREPINEPHRINE 4 MG/250ML-% IV SOLN
2.0000 ug/min | INTRAVENOUS | Status: DC
  Administered 2022-03-26 – 2022-03-27 (×2): 2 ug/min via INTRAVENOUS
  Filled 2022-03-26 (×2): qty 250

## 2022-03-26 MED ORDER — BUSPIRONE HCL 15 MG PO TABS: 15.0000 mg | ORAL_TABLET | Freq: Three times a day (TID) | ORAL | Status: DC

## 2022-03-26 MED ORDER — KETOROLAC TROMETHAMINE 0.5 % OP SOLN: 1.0000 [drp] | OPHTHALMIC | Status: DC

## 2022-03-26 MED ORDER — HEPARIN SODIUM (PORCINE) 5000 UNIT/ML IJ SOLN
5000.0000 [IU] | Freq: Three times a day (TID) | INTRAMUSCULAR | Status: DC
  Administered 2022-03-26 – 2022-03-31 (×15): 5000 [IU] via SUBCUTANEOUS
  Filled 2022-03-26 (×15): qty 1

## 2022-03-26 MED ORDER — BUSPIRONE HCL 15 MG PO TABS
15.0000 mg | ORAL_TABLET | Freq: Three times a day (TID) | ORAL | Status: DC
  Administered 2022-03-26 – 2022-03-29 (×7): 15 mg via ORAL
  Filled 2022-03-26 (×8): qty 1

## 2022-03-26 MED ORDER — LACTATED RINGERS IV BOLUS
500.0000 mL | Freq: Once | INTRAVENOUS | Status: AC
  Administered 2022-03-26: 500 mL via INTRAVENOUS

## 2022-03-26 MED ORDER — PREGABALIN 25 MG PO CAPS
25.0000 mg | ORAL_CAPSULE | Freq: Two times a day (BID) | ORAL | Status: DC
  Administered 2022-03-27 – 2022-03-29 (×4): 25 mg via ORAL
  Filled 2022-03-26 (×5): qty 1

## 2022-03-26 MED ORDER — POLYETHYLENE GLYCOL 3350 17 G PO PACK
17.0000 g | PACK | Freq: Every day | ORAL | Status: DC | PRN
  Filled 2022-03-26: qty 1

## 2022-03-26 MED ORDER — PANTOPRAZOLE SODIUM 40 MG PO TBEC: 40.0000 mg | DELAYED_RELEASE_TABLET | Freq: Every day | ORAL | Status: DC

## 2022-03-26 MED ORDER — RAMELTEON 8 MG PO TABS
8.0000 mg | ORAL_TABLET | Freq: Every day | ORAL | Status: DC
Start: 2022-03-26 — End: 2022-03-29
  Administered 2022-03-26 – 2022-03-28 (×3): 8 mg via ORAL
  Filled 2022-03-26 (×5): qty 1

## 2022-03-26 MED ORDER — DULOXETINE HCL 60 MG PO CPEP
60.0000 mg | ORAL_CAPSULE | Freq: Every day | ORAL | Status: DC
Start: 2022-03-26 — End: 2022-03-29
  Administered 2022-03-26 – 2022-03-29 (×3): 60 mg via ORAL
  Filled 2022-03-26 (×4): qty 1

## 2022-03-26 MED ORDER — PANTOPRAZOLE SODIUM 40 MG PO TBEC
40.0000 mg | DELAYED_RELEASE_TABLET | Freq: Every day | ORAL | Status: DC
  Administered 2022-03-27 – 2022-03-29 (×2): 40 mg via ORAL
  Filled 2022-03-26 (×3): qty 1

## 2022-03-26 MED ORDER — SODIUM CHLORIDE 0.9 % IV SOLN: 250.0000 mL | INTRAVENOUS | Status: DC

## 2022-03-26 MED ORDER — DOCUSATE SODIUM 50 MG/5ML PO LIQD
100.0000 mg | Freq: Two times a day (BID) | ORAL | Status: DC | PRN
  Filled 2022-03-26: qty 10

## 2022-03-26 MED ORDER — AMIODARONE HCL 200 MG PO TABS
100.0000 mg | ORAL_TABLET | Freq: Every day | ORAL | Status: DC
Start: 2022-03-27 — End: 2022-03-29
  Administered 2022-03-27 – 2022-03-29 (×2): 100 mg via ORAL
  Filled 2022-03-26 (×3): qty 1

## 2022-03-26 MED ORDER — LACTATED RINGERS IV BOLUS
250.0000 mL | Freq: Once | INTRAVENOUS | Status: AC
  Administered 2022-03-26: 250 mL via INTRAVENOUS

## 2022-03-26 MED ORDER — ROSUVASTATIN CALCIUM 20 MG PO TABS
20.0000 mg | ORAL_TABLET | Freq: Every day | ORAL | Status: DC
  Administered 2022-03-27 – 2022-03-29 (×2): 20 mg via ORAL
  Filled 2022-03-26 (×3): qty 1

## 2022-03-26 NOTE — Progress Notes (Signed)
eLink Physician-Brief Progress Note ?Patient Name: Martha Mora ?DOB: [birth year] ?MRN: [id number] ? ? ?Date of Service ? 03/26/2022  ?HPI/Events of Note ? Patient with a soft blood pressure (SBP 80's) and oliguria despite a 500 ml iv crystalloid bolus earlier. No central line to allow CVP measurement.  ?eICU Interventions ? Peripheral Norepinephrine gtt ordered to see if raising the MAP will translate into improved urine output.  ? ? ? ?  ? ?Kerry Kass Brooke Steinhilber ?03/26/2022, 4:03 AM ?

## 2022-03-26 NOTE — Assessment & Plan Note (Addendum)
Poor urine output initially with creatinine rising to 3.11.  Now back to baseline, but possibly volume overloaded given BNP of 1100 ? ?-Gentle diuresis ?-Follow BNP and creatinine. ?
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
## 2022-03-26 NOTE — Progress Notes (Signed)
Entered pt's room to answer vent alarm and found pt had self-extubated.  Placed on NRB mask and o2 saturation is now 92%.  MD aware. ?

## 2022-03-26 NOTE — Progress Notes (Signed)
eLink Physician-Brief Progress Note ?Patient Name: Jodine Muchmore ?DOB: May 22, 1946 ?MRN: [id number] ? ? ?Date of Service ? 03/26/2022  ?HPI/Events of Note ? Patient with a soft blood pressure (88/68) and oliguria.  ?eICU Interventions ? LR 250 ml iv fluid bolus ordered infused over 1 hour.  ? ? ? ?  ? ?Kerry Kass Dartanion Teo ?03/26/2022, 10:58 PM ?
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
## 2022-03-26 NOTE — Progress Notes (Signed)
An USGPIV (ultrasound guided PIV) has been placed in R anterior forearm for short-term vasopressor infusion. A correctly placed ivWatch must be used when administering Vasopressors. Should this treatment be needed beyond 72 hours, central line access should be obtained.  It will be the responsibility of the bedside nurse to follow best practice to prevent extravasations.   ?

## 2022-03-27 ENCOUNTER — Ambulatory Visit: Payer: Medicare Other

## 2022-03-27 DIAGNOSIS — J9601 Acute respiratory failure with hypoxia: Secondary | ICD-10-CM

## 2022-03-27 DIAGNOSIS — J9602 Acute respiratory failure with hypercapnia: Secondary | ICD-10-CM | POA: Diagnosis not present

## 2022-03-27 LAB — BLOOD GAS, ARTERIAL
Acid-Base Excess: 4.3 mmol/L — ABNORMAL HIGH (ref 0.0–2.0)
Bicarbonate: 31.8 mmol/L — ABNORMAL HIGH (ref 20.0–28.0)
Drawn by: 36527
O2 Saturation: 56.4 %
Patient temperature: 36.9
pCO2 arterial: 59 mmHg — ABNORMAL HIGH (ref 32–48)
pH, Arterial: 7.34 — ABNORMAL LOW (ref 7.35–7.45)
pO2, Arterial: 36 mmHg — CL (ref 83–108)

## 2022-03-27 LAB — CBC WITH DIFFERENTIAL/PLATELET
Abs Immature Granulocytes: 0.2 10*3/uL — ABNORMAL HIGH (ref 0.00–0.07)
Basophils Absolute: 0.1 10*3/uL (ref 0.0–0.1)
Basophils Relative: 0 %
Eosinophils Absolute: 0 10*3/uL (ref 0.0–0.5)
Eosinophils Relative: 0 %
HCT: 30 % — ABNORMAL LOW (ref 36.0–46.0)
Hemoglobin: 9 g/dL — ABNORMAL LOW (ref 12.0–15.0)
Immature Granulocytes: 1 %
Lymphocytes Relative: 10 %
Lymphs Abs: 2.2 10*3/uL (ref 0.7–4.0)
MCH: 30.6 pg (ref 26.0–34.0)
MCHC: 30 g/dL (ref 30.0–36.0)
MCV: 102 fL — ABNORMAL HIGH (ref 80.0–100.0)
Monocytes Absolute: 2.1 10*3/uL — ABNORMAL HIGH (ref 0.1–1.0)
Monocytes Relative: 9 %
Neutro Abs: 18.4 10*3/uL — ABNORMAL HIGH (ref 1.7–7.7)
Neutrophils Relative %: 80 %
Platelets: 228 10*3/uL (ref 150–400)
RBC: 2.94 MIL/uL — ABNORMAL LOW (ref 3.87–5.11)
RDW: 14.6 % (ref 11.5–15.5)
WBC: 23 10*3/uL — ABNORMAL HIGH (ref 4.0–10.5)
nRBC: 0 % (ref 0.0–0.2)

## 2022-03-27 LAB — POCT I-STAT 7, (LYTES, BLD GAS, ICA,H+H)
Acid-Base Excess: 3 mmol/L — ABNORMAL HIGH (ref 0.0–2.0)
Bicarbonate: 29.1 mmol/L — ABNORMAL HIGH (ref 20.0–28.0)
Calcium, Ion: 1.26 mmol/L (ref 1.15–1.40)
HCT: 30 % — ABNORMAL LOW (ref 36.0–46.0)
Hemoglobin: 10.2 g/dL — ABNORMAL LOW (ref 12.0–15.0)
O2 Saturation: 91 %
Patient temperature: 99.2
Potassium: 4.4 mmol/L (ref 3.5–5.1)
Sodium: 141 mmol/L (ref 135–145)
TCO2: 31 mmol/L (ref 22–32)
pCO2 arterial: 49.3 mmHg — ABNORMAL HIGH (ref 32–48)
pH, Arterial: 7.38 (ref 7.35–7.45)
pO2, Arterial: 65 mmHg — ABNORMAL LOW (ref 83–108)

## 2022-03-27 LAB — BASIC METABOLIC PANEL
Anion gap: 7 (ref 5–15)
BUN: 48 mg/dL — ABNORMAL HIGH (ref 8–23)
CO2: 26 mmol/L (ref 22–32)
Calcium: 8.5 mg/dL — ABNORMAL LOW (ref 8.9–10.3)
Chloride: 107 mmol/L (ref 98–111)
Creatinine, Ser: 1.77 mg/dL — ABNORMAL HIGH (ref 0.44–1.00)
GFR, Estimated: 29 mL/min — ABNORMAL LOW (ref >60)
Glucose, Bld: 151 mg/dL — ABNORMAL HIGH (ref 70–99)
Potassium: 4.8 mmol/L (ref 3.5–5.1)
Sodium: 140 mmol/L (ref 135–145)

## 2022-03-27 LAB — GLUCOSE, CAPILLARY
Glucose-Capillary: 151 mg/dL — ABNORMAL HIGH (ref 70–99)
Glucose-Capillary: 151 mg/dL — ABNORMAL HIGH (ref 70–99)
Glucose-Capillary: 163 mg/dL — ABNORMAL HIGH (ref 70–99)
Glucose-Capillary: 164 mg/dL — ABNORMAL HIGH (ref 70–99)
Glucose-Capillary: 170 mg/dL — ABNORMAL HIGH (ref 70–99)

## 2022-03-27 MED ORDER — HALOPERIDOL LACTATE 5 MG/ML IJ SOLN
2.0000 mg | Freq: Once | INTRAMUSCULAR | Status: AC
  Administered 2022-03-27: 2 mg via INTRAVENOUS
  Filled 2022-03-27: qty 1

## 2022-03-27 MED ORDER — DEXMEDETOMIDINE HCL IN NACL 400 MCG/100ML IV SOLN
INTRAVENOUS | Status: AC
  Administered 2022-03-27: 0.2 ug/kg/h via INTRAVENOUS
  Filled 2022-03-27: qty 100

## 2022-03-27 MED ORDER — HALOPERIDOL LACTATE 5 MG/ML IJ SOLN
2.0000 mg | Freq: Once | INTRAMUSCULAR | Status: AC
  Administered 2022-03-27: 2 mg via INTRAVENOUS

## 2022-03-27 MED ORDER — HALOPERIDOL LACTATE 5 MG/ML IJ SOLN
INTRAMUSCULAR | Status: AC
  Administered 2022-03-27: 5 mg
  Filled 2022-03-27: qty 1

## 2022-03-27 MED ORDER — HALOPERIDOL LACTATE 5 MG/ML IJ SOLN
1.0000 mg | INTRAMUSCULAR | Status: AC | PRN
  Administered 2022-03-27 – 2022-03-28 (×5): 1 mg via INTRAVENOUS
  Filled 2022-03-27 (×5): qty 1

## 2022-03-27 MED ORDER — DEXMEDETOMIDINE HCL IN NACL 400 MCG/100ML IV SOLN
0.4000 ug/kg/h | INTRAVENOUS | Status: AC
  Administered 2022-03-27 (×2): 1.2 ug/kg/h via INTRAVENOUS
  Administered 2022-03-27: 1 ug/kg/h via INTRAVENOUS
  Administered 2022-03-27 – 2022-03-28 (×6): 1.2 ug/kg/h via INTRAVENOUS
  Administered 2022-03-28: 0.8 ug/kg/h via INTRAVENOUS
  Administered 2022-03-29 (×2): 1.2 ug/kg/h via INTRAVENOUS
  Filled 2022-03-27 (×2): qty 100
  Filled 2022-03-27: qty 200
  Filled 2022-03-27 (×3): qty 100
  Filled 2022-03-27: qty 200
  Filled 2022-03-27 (×6): qty 100

## 2022-03-27 MED ORDER — SODIUM CHLORIDE 0.9 % IV SOLN
1.0000 g | INTRAVENOUS | Status: AC
  Administered 2022-03-27 – 2022-04-02 (×7): 1 g via INTRAVENOUS
  Filled 2022-03-27 (×7): qty 10

## 2022-03-27 MED ORDER — HALOPERIDOL LACTATE 5 MG/ML IJ SOLN
5.0000 mg | Freq: Once | INTRAMUSCULAR | Status: AC
  Administered 2022-03-27: 5 mg via INTRAVENOUS
  Filled 2022-03-27: qty 1

## 2022-03-27 MED ORDER — QUETIAPINE FUMARATE 50 MG PO TABS
50.0000 mg | ORAL_TABLET | Freq: Two times a day (BID) | ORAL | Status: DC
Start: 2022-03-27 — End: 2022-03-28
  Administered 2022-03-27 (×2): 50 mg via ORAL
  Filled 2022-03-27 (×3): qty 1

## 2022-03-27 NOTE — Progress Notes (Signed)
eLink Physician-Brief Progress Note ?Patient Name: Shunna Mikaelian ?DOB: [birth year] ?MRN: [id number] ? ? ?Date of Service ? 03/27/2022  ?HPI/Events of Note ? Patient with agitated delirium, she keeps pulling off her oxygen.  ?eICU Interventions ? Bilateral soft restraints order  entered for patient safety.  ? ? ? ?  ? ?Frederik Pear ?03/27/2022, 7:14 AM ?

## 2022-03-27 NOTE — Progress Notes (Signed)
Speech Language Pathology Treatment: Dysphagia  ?Patient Details ?Name: Martha Mora ?MRN: [id number] ?DOB: Jan 03, 1946 ?Today's Date: 03/27/2022 ?Time: 4403-4742 ?SLP Time Calculation (min) (ACUTE ONLY): 17 min ? ?Assessment / Plan / Recommendation ?Clinical Impression ? Patient seen by SLP for skilled treatment focused on dysphagia goals. Patient was awake and alert when SLP entered the room and she was receptive towards some PO intake of water and applesauce. SLP provided oral care with toothette sponge soaked in water and patient did manipulate toothette sponge in mouth as well. Oral mucosa dry but no secretions observed. SLP then observed patient with straw sips of thin liquids (water). Patient was able to hold the cup with assitance to support but also required tactile cues to place straw in mouth. Swallow initiation delay suspected but only one mild cough response observed which occured after first sip.  Patient was then agreeable to a couple bites of applesauce but after second bite said, "Alright thats enough". She was very fidgety in bed and required frequent cues to direct and redirect her attention. Per her RN, she was able to take some oral meds crushed in applesauce. SLP will continue to follow patient for readiness to initiate PO's and/or need for objective swallow study (MBS). Currently she is too confused, agitated and distracted to participate. Recommend continue NPO but allow sips of water PRN and can try meds crushed in puree.  ?  ?HPI HPI: 76 year old female with pertinent PMH of COPD on 2L [place] at home, A-fib, anxiety, depression presents to [facility] ED on 5/7 with recurrent epistaxis. Needed rhino pocket and pt became anxious, confused/altered and intubated 5/8 and self extubated 5/9. BSE 01/01/22 with no s/sx dysphagia and reg/thin recommended. ?  ?   ?SLP Plan ? Continue with current plan of care ? ?  ?  ?Recommendations for follow up therapy are one component of a multi-disciplinary discharge  planning process, led by the attending physician.  Recommendations may be updated based on patient status, additional functional criteria and insurance authorization. ?  ? ?Recommendations  ?Diet recommendations: NPO ?Medication Administration: Crushed with puree ?Supervision: Full supervision/cueing for compensatory strategies;Staff to assist with self feeding ?Compensations: Slow rate;Small sips/bites ?Postural Changes and/or Swallow Maneuvers: Seated upright 90 degrees  ?   ?    ?   ? ? ? ? Oral Care Recommendations: Oral care QID;Staff/trained caregiver to provide oral care ?Follow Up Recommendations: Follow physician's recommendations for discharge plan and follow up therapies ?Assistance recommended at discharge: Frequent or constant Supervision/Assistance ?SLP Visit Diagnosis: Dysphagia, unspecified (R13.10) ?Plan: Continue with current plan of care ? ? ? ? ?  ?  ? ?Sonia Baller, MA, CCC-SLP ?Speech Therapy ? ?
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
## 2022-03-27 NOTE — Progress Notes (Addendum)
eLink Physician-Brief Progress Note ?Patient Name: Martha Mora ?DOB: 28-Jul-1946 ?MRN: [id number] ? ? ?Date of Service ? 03/27/2022  ?HPI/Events of Note ? Patient with agitated delirium, she keeps pulling off her oxygen mask, Qtc is 457 on a recent EKG.  ?eICU Interventions ? Haldol 2 mg iv x 1 ordered.  ? ? ? ?  ? ?Frederik Pear ?03/27/2022, 4:39 AM ?

## 2022-03-27 NOTE — Plan of Care (Signed)
?  Problem: Education: ?Goal: Knowledge of General Education information will improve ?Description: Including pain rating scale, medication(s)/side effects and non-pharmacologic comfort measures ?Outcome: Not Progressing ?  ?Problem: Health Behavior/Discharge Planning: ?Goal: Ability to manage health-related needs will improve ?Outcome: Not Progressing ?  ?Problem: Clinical Measurements: ?Goal: Ability to maintain clinical measurements within normal limits will improve ?Outcome: Not Progressing ?Goal: Will remain free from infection ?Outcome: Not Progressing ?Goal: Diagnostic test results will improve ?Outcome: Not Progressing ?Goal: Respiratory complications will improve ?Outcome: Not Progressing ?Goal: Cardiovascular complication will be avoided ?Outcome: Not Progressing ?  ?Problem: Activity: ?Goal: Risk for activity intolerance will decrease ?Outcome: Not Progressing ?  ?Problem: Nutrition: ?Goal: Adequate nutrition will be maintained ?Outcome: Not Progressing ?  ?Problem: Coping: ?Goal: Level of anxiety will decrease ?Outcome: Not Progressing ?  ?Problem: Elimination: ?Goal: Will not experience complications related to bowel motility ?Outcome: Not Progressing ?Goal: Will not experience complications related to urinary retention ?Outcome: Not Progressing ?  ?Problem: Pain Managment: ?Goal: General experience of comfort will improve ?Outcome: Not Progressing ?  ?Problem: Safety: ?Goal: Ability to remain free from injury will improve ?Outcome: Not Progressing ?  ?Problem: Skin Integrity: ?Goal: Risk for impaired skin integrity will decrease ?Outcome: Not Progressing ?  ?Problem: Activity: ?Goal: Ability to tolerate increased activity will improve ?Outcome: Not Progressing ?  ?Problem: Respiratory: ?Goal: Ability to maintain a clear airway and adequate ventilation will improve ?Outcome: Not Progressing ?  ?Problem: Role Relationship: ?Goal: Method of communication will improve ?Outcome: Not Progressing ?  ?Problem:  Safety: ?Goal: Non-violent Restraint(s) ?Outcome: Not Progressing ?  ?

## 2022-03-27 NOTE — Progress Notes (Signed)
This nurse is the 2nd VAST nurse that has assessed for USGPIV placement. This patient has had 6 PIVs in 2 days, with multiple infiltrations. Veins to small/splitting or to deep. Not appropriate for midline d/t vasopressor and renal function. Not appropriate for PICC d/t renal function. Janett Billow RN, Dr. Lynetta Mare, Dr. Laural Golden notified. Fran Lowes, RN VAST ?

## 2022-03-27 NOTE — Progress Notes (Signed)
Dear Doctor: This patient has been identified as a candidate for CVC (d/t CrCl neither a PICC or midline (vasopressor) or the following reason (s): poor veins/poor circulatory system (CHF, COPD, emphysema, diabetes, steroid use, IV drug abuse, etc.), restarts due to phlebitis and infiltration in 24 hours, and incompatible drugs (aminophyllin, TPN, heparin, given with an antibiotic) If you agree, please write an order for the indicated device. ?Thank you for supporting the early vascular access assessment program. ?

## 2022-03-27 NOTE — Progress Notes (Signed)
eLink Physician-Brief Progress Note ?Patient Name: Martha Mora ?DOB: December 24, 1945 ?MRN: [id number] ? ? ?Date of Service ? 03/27/2022  ?HPI/Events of Note ? Notified that current peripheral access has infiltrated, with levophed and precedex running. ?Pt starting to become agitated.  ?Pt has lost multiple peripheral IV sites, and is deemed to be a poor candidate for PICC given renal dysfunction.  ?  ?eICU Interventions ? Will give haldol '5mg'$  x 1.  ?Notified ground team for assessment, possible placement of central line.   ? ? ? ?  ? ?[place] ?03/27/2022, 8:00 PM ?

## 2022-03-27 NOTE — Progress Notes (Signed)
Notified MD of blood results.  ?

## 2022-03-27 NOTE — Progress Notes (Signed)
Nutrition Follow-up ? ?DOCUMENTATION CODES:  ? ?Not applicable ? ?INTERVENTION:  ? ?- RD will follow for diet advancement and order oral nutrition supplements as appropriate ? ?NUTRITION DIAGNOSIS:  ? ?Inadequate oral intake related to inability to eat as evidenced by NPO status. ? ?Ongoing ? ?GOAL:  ? ?Patient will meet greater than or equal to 90% of their needs ? ?Unmet at this time ? ?MONITOR:  ? ?Diet advancement, Labs, Weight trends, I & O's ? ?REASON FOR ASSESSMENT:  ? ?Ventilator ?  ? ?ASSESSMENT:  ? ?76 year old female who presented to the ED on 5/07 with epistaxis. PMH of COPD, atrial fibrillation, anxiety, depression, HTN, HLD, CAD, GERD. Pt required intubation in the ED due to anxiety and SOB. ? ?05/09 - self-extubated ? ?Discussed pt with RN and during ICU rounds. Pt is confused with acute delirium per MD. Pt on precedex drip. ? ?Pt did not pass swallow evaluation today with SLP. Pt remains NPO. Pt has not received nutrition since admission. Discussed with CCM MD. Per MD, pt is not a candidate for Cortrak NG tube placement due to recent epistaxis. MD would like to watch and wait to see if pt returns to baseline and is cleared for a diet. RD will follow for diet advancement and add oral nutrition supplements as appropriate. ? ?Admit weight: 108.2 kg ?Current weight: 109.9 kg ? ?Medications reviewed and include: protonix, IV abx, precedex drip, levophed drip ?IVF: LR @ 75 ml/hr ? ?Labs reviewed: BUN 48, creatinine 1.77, WBC 23.0, hemoglobin 9.0 ?CBG's: 139-164 x 24 hours ? ?UOP: 830 ml x 24 hours ?I/O's: +4.1 L since admit ? ?NUTRITION - FOCUSED PHYSICAL EXAM: ? ?Flowsheet Row Most Recent Value  ?Orbital Region No depletion  ?Upper Arm Region No depletion  ?Thoracic and Lumbar Region No depletion  ?Buccal Region Mild depletion  ?Temple Region No depletion  ?Clavicle Bone Region No depletion  ?Clavicle and Acromion Bone Region Mild depletion  ?Scapular Bone Region No depletion  ?Dorsal Hand No depletion   ?Patellar Region No depletion  ?Anterior Thigh Region Mild depletion  ?Posterior Calf Region Mild depletion  ?Edema (RD Assessment) Mild  ?Hair Reviewed  ?Eyes Reviewed  ?Mouth Reviewed  ?Skin Reviewed  ?Nails Reviewed  ? ?  ? ? ?Diet Order:   ?Diet Order   ? ?       ?  Diet NPO time specified  Diet effective now       ?  ? ?  ?  ? ?  ? ? ?EDUCATION NEEDS:  ? ?Not appropriate for education at this time ? ?Skin:  Skin Assessment: Reviewed RN Assessment (MASD abdomen) ? ?Last BM:  no documented BM ? ?Height:  ? ?Ht Readings from Last 1 Encounters:  ?03/25/22 '5\' 1"'$  (1.549 m)  ? ? ?Weight:  ? ?Wt Readings from Last 1 Encounters:  ?03/26/22 109.9 kg  ? ? ?Ideal Body Weight:  47.7 kg ? ?BMI:  Body mass index is 45.78 kg/m?. ? ?Estimated Nutritional Needs:  ? ?Kcal:  1700-1900 ? ?Protein:  90-105 grams ? ?Fluid:  1.7-1.9 L ? ? ? ?Gustavus Bryant, MS, RD, LDN ?Inpatient Clinical Dietitian ?Please see AMiON for contact information. ? ?

## 2022-03-27 NOTE — Assessment & Plan Note (Addendum)
Much calmer today.  Oriented and redirectable.  Still has baseline anxiety.  Now off Precedex ? ?-Continue oral medications. ?-We will add oral Seroquel to limit need for Geodon. ?-Requirement for sedatives should improve as delirium continues to clear. ?

## 2022-03-27 NOTE — Progress Notes (Signed)
eLink Physician-Brief Progress Note ?Patient Name: Martha Mora ?DOB: Oct 02, 1946 ?MRN: [id number] ? ? ?Date of Service ? 03/27/2022  ?HPI/Events of Note ? BP remains soft, and urine output marginal despite fluid bolus.  ?eICU Interventions ? Levophed gtt to be re-started targeting a MAP > 65 mmHg.  ? ? ? ?  ? ?Frederik Pear ?03/27/2022, 1:50 AM ?

## 2022-03-27 NOTE — Progress Notes (Signed)
Patient placed on CPAP 10 cmH20 two times but continues to remove it stating she can not breath. She is non-compliant with use. ?

## 2022-03-27 NOTE — Progress Notes (Signed)
PT Cancellation Note ? ?Patient Details ?Name: Martha Mora ?MRN: [id number] ?DOB: January 29, 1946 ? ? ?Cancelled Treatment:    Reason Eval/Treat Not Completed: (P) Medical issues which prohibited therapy Attempted Evaluation, however pt delirious only oriented to self, loosened restraint and pt immediately tore off mitten and requires increased effort to reapply restraints. PT will follow back for evaluation when more appropriate.  ? ?Kiran Carline B. Migdalia Dk PT, DPT ?Acute Rehabilitation Services ?Please use secure chat or  ?Call Office [phone] ? ? ? ?[place] ?03/27/2022, 9:19 AM ? ? ?

## 2022-03-27 NOTE — Progress Notes (Signed)
eLink Physician-Brief Progress Note ?Patient Name: Martha Mora ?DOB: [birth year] ?MRN: [id number] ? ? ?Date of Service ? 03/27/2022  ?HPI/Events of Note ? Haldol had no effect on patient's agitation, and she continued to pull off her mask and squirm around frenetically..  ?eICU Interventions ? Precedex gtt ordered.  ? ? ? ?  ? ?Frederik Pear ?03/27/2022, 4:54 AM ?

## 2022-03-28 ENCOUNTER — Inpatient Hospital Stay (HOSPITAL_COMMUNITY): Payer: Medicare Other

## 2022-03-28 DIAGNOSIS — J9601 Acute respiratory failure with hypoxia: Secondary | ICD-10-CM | POA: Diagnosis not present

## 2022-03-28 DIAGNOSIS — J9602 Acute respiratory failure with hypercapnia: Secondary | ICD-10-CM | POA: Diagnosis not present

## 2022-03-28 LAB — CBC WITH DIFFERENTIAL/PLATELET
Abs Immature Granulocytes: 0.08 10*3/uL — ABNORMAL HIGH (ref 0.00–0.07)
Basophils Absolute: 0.1 10*3/uL (ref 0.0–0.1)
Basophils Relative: 0 %
Eosinophils Absolute: 0 10*3/uL (ref 0.0–0.5)
Eosinophils Relative: 0 %
HCT: 27.7 % — ABNORMAL LOW (ref 36.0–46.0)
Hemoglobin: 8.5 g/dL — ABNORMAL LOW (ref 12.0–15.0)
Immature Granulocytes: 1 %
Lymphocytes Relative: 10 %
Lymphs Abs: 1.6 10*3/uL (ref 0.7–4.0)
MCH: 30.1 pg (ref 26.0–34.0)
MCHC: 30.7 g/dL (ref 30.0–36.0)
MCV: 98.2 fL (ref 80.0–100.0)
Monocytes Absolute: 1.5 10*3/uL — ABNORMAL HIGH (ref 0.1–1.0)
Monocytes Relative: 10 %
Neutro Abs: 12.4 10*3/uL — ABNORMAL HIGH (ref 1.7–7.7)
Neutrophils Relative %: 79 %
Platelets: UNDETERMINED 10*3/uL (ref 150–400)
RBC: 2.82 MIL/uL — ABNORMAL LOW (ref 3.87–5.11)
RDW: 14 % (ref 11.5–15.5)
WBC: 15.6 10*3/uL — ABNORMAL HIGH (ref 4.0–10.5)
nRBC: 0 % (ref 0.0–0.2)

## 2022-03-28 LAB — BASIC METABOLIC PANEL
Anion gap: 10 (ref 5–15)
Anion gap: 7 (ref 5–15)
BUN: 41 mg/dL — ABNORMAL HIGH (ref 8–23)
BUN: 38 mg/dL — ABNORMAL HIGH (ref 8–23)
CO2: 26 mmol/L (ref 22–32)
CO2: 30 mmol/L (ref 22–32)
Calcium: 4.9 mg/dL — CL (ref 8.9–10.3)
Calcium: 8.8 mg/dL — ABNORMAL LOW (ref 8.9–10.3)
Chloride: 107 mmol/L (ref 98–111)
Chloride: 108 mmol/L (ref 98–111)
Creatinine, Ser: 1.2 mg/dL — ABNORMAL HIGH (ref 0.44–1.00)
Creatinine, Ser: 1.25 mg/dL — ABNORMAL HIGH (ref 0.44–1.00)
GFR, Estimated: 46 mL/min — ABNORMAL LOW (ref >60)
GFR, Estimated: 44 mL/min — ABNORMAL LOW (ref >60)
Glucose, Bld: 156 mg/dL — ABNORMAL HIGH (ref 70–99)
Glucose, Bld: 159 mg/dL — ABNORMAL HIGH (ref 70–99)
Potassium: 7.2 mmol/L (ref 3.5–5.1)
Potassium: 4.7 mmol/L (ref 3.5–5.1)
Sodium: 143 mmol/L (ref 135–145)
Sodium: 145 mmol/L (ref 135–145)

## 2022-03-28 LAB — GLUCOSE, CAPILLARY
Glucose-Capillary: 145 mg/dL — ABNORMAL HIGH (ref 70–99)
Glucose-Capillary: 152 mg/dL — ABNORMAL HIGH (ref 70–99)
Glucose-Capillary: 152 mg/dL — ABNORMAL HIGH (ref 70–99)
Glucose-Capillary: 154 mg/dL — ABNORMAL HIGH (ref 70–99)
Glucose-Capillary: 162 mg/dL — ABNORMAL HIGH (ref 70–99)
Glucose-Capillary: 163 mg/dL — ABNORMAL HIGH (ref 70–99)

## 2022-03-28 MED ORDER — HALOPERIDOL LACTATE 5 MG/ML IJ SOLN
5.0000 mg | Freq: Once | INTRAMUSCULAR | Status: AC
  Administered 2022-03-28: 5 mg via INTRAVENOUS
  Filled 2022-03-28: qty 1

## 2022-03-28 MED ORDER — VALPROATE SODIUM 100 MG/ML IV SOLN
500.0000 mg | Freq: Two times a day (BID) | INTRAVENOUS | Status: DC
  Administered 2022-03-28 – 2022-03-31 (×7): 500 mg via INTRAVENOUS
  Filled 2022-03-28 (×8): qty 5

## 2022-03-28 MED ORDER — STERILE WATER FOR INJECTION IJ SOLN
INTRAMUSCULAR | Status: AC
  Filled 2022-03-28: qty 10

## 2022-03-28 MED ORDER — ZIPRASIDONE MESYLATE 20 MG IM SOLR
20.0000 mg | Freq: Once | INTRAMUSCULAR | Status: AC
  Administered 2022-03-28: 20 mg via INTRAMUSCULAR
  Filled 2022-03-28: qty 20

## 2022-03-28 MED ORDER — FUROSEMIDE 10 MG/ML IJ SOLN
40.0000 mg | Freq: Once | INTRAMUSCULAR | Status: AC
  Administered 2022-03-28: 40 mg via INTRAVENOUS
  Filled 2022-03-28: qty 4

## 2022-03-28 MED ORDER — STERILE WATER FOR INJECTION IJ SOLN
INTRAMUSCULAR | Status: AC
  Administered 2022-03-28: 1 mL
  Filled 2022-03-28: qty 10

## 2022-03-28 MED ORDER — ZIPRASIDONE MESYLATE 20 MG IM SOLR
10.0000 mg | Freq: Four times a day (QID) | INTRAMUSCULAR | Status: DC | PRN
  Administered 2022-03-28 – 2022-03-30 (×4): 10 mg via INTRAMUSCULAR
  Filled 2022-03-28 (×5): qty 20

## 2022-03-28 NOTE — Evaluation (Signed)
Physical Therapy Evaluation ?Patient Details ?Name: Martha Mora ?MRN: [id number] ?DOB: [birth year] ?Today's Date: 03/28/2022 ? ?History of Present Illness ? Pt adm  5/7 with epitaxis. Pt required packing of nares. Pt became agitated and disoriented and was intubated for airway protection. Nose cauterized. Pt self extubated 5/9. Pt remained agitated with acute delirium. PMH - CAD, afib, COPD, HTN, breast cancer, anxiety, depression  ?Clinical Impression ? Pt presents with decr mobility due to decr cognition, weakness, and decr balance. Currently pt agitated/restless. At this level will need SNF. If cognition clears pt may make rapid improvement and other dc options may be more feasible. Will continue to follow and assess for dc needs.    ?   ? ?Recommendations for follow up therapy are one component of a multi-disciplinary discharge planning process, led by the attending physician.  Recommendations may be updated based on patient status, additional functional criteria and insurance authorization. ? ?Follow Up Recommendations Skilled nursing-short term rehab (<3 hours/day) ? ?  ?Assistance Recommended at Discharge Frequent or constant Supervision/Assistance  ?Patient can return home with the following ? Two people to help with walking and/or transfers;Two people to help with bathing/dressing/bathroom;Help with stairs or ramp for entrance;Assist for transportation ? ?  ?Equipment Recommendations None recommended by PT  ?Recommendations for Other Services ?    ?  ?Functional Status Assessment Patient has had a recent decline in their functional status and demonstrates the ability to make significant improvements in function in a reasonable and predictable amount of time.  ? ?  ?Precautions / Restrictions Precautions ?Precautions: Fall  ? ?  ? ?Mobility ? Bed Mobility ?Overal bed mobility: Needs Assistance ?Bed Mobility: Supine to Sit, Sit to Supine ?  ?  ?Supine to sit: Min guard, HOB elevated ?Sit to supine: Min  assist ?  ?General bed mobility comments: Pt coming to sit EOB with rails still up spontaneously. Assist for safety. Assist to guide trunk down and bring legs back up into bed ?  ? ?Transfers ?  ?  ?  ?  ?  ?  ?  ?  ?  ?General transfer comment: Did not attempt due to agitation ?  ? ?Ambulation/Gait ?  ?  ?  ?  ?  ?  ?  ?  ? ?Stairs ?  ?  ?  ?  ?  ? ?Wheelchair Mobility ?  ? ?Modified Rankin (Stroke Patients Only) ?  ? ?  ? ?Balance Overall balance assessment: Needs assistance ?Sitting-balance support: Bilateral upper extremity supported, Feet unsupported ?Sitting balance-Leahy Scale: Poor ?Sitting balance - Comments: Sits unsupported but min guard to min assist for safety ?  ?  ?  ?  ?  ?  ?  ?  ?  ?  ?  ?  ?  ?  ?  ?   ? ? ? ?Pertinent Vitals/Pain Pain Assessment ?Pain Assessment: Faces ?Faces Pain Scale: No hurt  ? ? ?Home Living Family/patient expects to be discharged to:: Private residence ?Living Arrangements: Alone ?Available Help at Discharge: Family;Available PRN/intermittently ?Type of Home: House ?Home Access: Stairs to enter ?Entrance Stairs-Rails: Right ?Entrance Stairs-Number of Steps: 1 flight ?  ?Home Layout: One level ?Home Equipment: Rolling Walker (2 wheels);BSC/3in1;Rollator (4 wheels);Other (comment);Cane - single point;Shower seat ?Additional Comments: has a motorized scooter, does not use at baseline  ?  ?Prior Function Prior Level of Function : Independent/Modified Independent;Driving ?  ?  ?  ?  ?  ?  ?Mobility Comments: Uses cane for amb ?  ?  ? ? ?  Hand Dominance  ? Dominant Hand: Right ? ?  ?Extremity/Trunk Assessment  ? Upper Extremity Assessment ?Upper Extremity Assessment: Defer to OT evaluation ?  ? ?Lower Extremity Assessment ?Lower Extremity Assessment: Generalized weakness ?  ? ?   ?Communication  ? Communication: Expressive difficulties (difficult to understand)  ?Cognition Arousal/Alertness: Awake/alert ?Behavior During Therapy: Agitated, Restless ?Overall Cognitive Status:  Difficult to assess ?  ?  ?  ?  ?  ?  ?  ?  ?  ?  ?  ?  ?  ?  ?  ?  ?General Comments: Pt following 1 step commands only occasionally. ?  ?  ? ?  ?General Comments General comments (skin integrity, edema, etc.): Pt on HFNC at ? ?  ?Exercises    ? ?Assessment/Plan  ?  ?PT Assessment Patient needs continued PT services  ?PT Problem List Decreased strength;Decreased balance;Decreased mobility;Decreased activity tolerance;Decreased cognition;Decreased safety awareness;Cardiopulmonary status limiting activity ? ?   ?  ?PT Treatment Interventions DME instruction;Gait training;Functional mobility training;Therapeutic activities;Therapeutic exercise;Balance training;Patient/family education   ? ?PT Goals (Current goals can be found in the Care Plan section)  ?Acute Rehab PT Goals ?PT Goal Formulation: Patient unable to participate in goal setting ?Time For Goal Achievement: 04/11/22 ?Potential to Achieve Goals: Fair ? ?  ?Frequency Min 3X/week ?  ? ? ?Co-evaluation   ?  ?  ?  ?  ? ? ?  ?AM-PAC PT "6 Clicks" Mobility  ?Outcome Measure Help needed turning from your back to your side while in a flat bed without using bedrails?: A Little ?Help needed moving from lying on your back to sitting on the side of a flat bed without using bedrails?: A Little ?Help needed moving to and from a bed to a chair (including a wheelchair)?: Total ?Help needed standing up from a chair using your arms (e.g., wheelchair or bedside chair)?: Total ?Help needed to walk in hospital room?: Total ?Help needed climbing 3-5 steps with a railing? : Total ?6 Click Score: 10 ? ?  ?End of Session Equipment Utilized During Treatment: Oxygen ?Activity Tolerance: Treatment limited secondary to agitation ?Patient left: in bed;with call bell/phone within reach;with bed alarm set ?Nurse Communication: Mobility status ?PT Visit Diagnosis: Other abnormalities of gait and mobility (R26.89) ?  ? ?Time: 1771-1657 ?PT Time Calculation (min) (ACUTE ONLY): 20  min ? ? ?Charges:   PT Evaluation ?$PT Eval Moderate Complexity: 1 Mod ?  ?  ?   ? ? ?[facility] PT ?Acute Rehabilitation Services ?Office [phone] ? ? ?Shary Decamp [facility] ?03/28/2022, 4:38 PM ? ?

## 2022-03-28 NOTE — Progress Notes (Signed)
Questionable BMP results. Will re-collect. ?

## 2022-03-28 NOTE — Hospital Course (Addendum)
Timeline of events 5/7 admitted to [facility] with epistaxis; intubated for airway protection; ENT consulted  5/8 bleeding right nare cauterized by and balloon packing placed. Epistat balloon came out. Also started on NE overnight for soft pressures.  5/9 self extubated now on NRB, weaned from pressors   5/10 on and off levophed for soft pressures, started on precedex for agitation 5/13 continues to have agitated delirium.  On heated high flow nasal cannula. 5/14 No obvious bleeding at this time.  Epistat deflated and removed. Remained calm and redirectable with some baseline anxiety overnight off Precedex.  Musculoskeletal pain has improved with local measures 5/16 Heparin restarted for A. Fib 5/20 Eliquis restarted for A Fib   Epistaxis Martha Mora is a 76 yr old female who presented with massive epistaxis on Eliquis. Initially she was treated with Afrin, nasal pressure and a rhino rocket. Eliquis was held.The bleeding did not stop left>right nare and pt became altered and confused. She was subsequently intubated for airway protection and admitted to ICU on 5/8. ENT were consulted who placed a Epistat baloon and cauterized the right nares. She self extubated on 5/9. Heparin restarted 5/16 for A. Fib which was stopped after IV pulled 5/17. Was continued on DVT prophylaxis and transitioned to eliquis on 5/20. Did not have recurrence of epistaxis and hemoglobin remained stable at discharge.   Respiratory failure with hypoxia  Previously intubated for agitation and hypoventilation in setting of massive epistaxis.  Was transitioned to heated high flow since self extubation.  Suspect combination of aspiration during initial episode of epistaxis plus component of volume overload. Was weaned from [facility] and discharged on 2.5 L O2 humidified. CXR was obtained on 5/18 d/t continue high flow which showed interval improvement of pulmonary edema seen on previous CXR in ICU. Patient was transitioned from IV diuresis to PO  home diuretic.  Atrial fibrillation Remained stable on amiodarone 100 mg daily and metoprolol. Eliquis was stopped on admission and restarted on 5/20. Rates remained stable.  Acute deliriumAnxiety Home medications of buspirone 15 mg 3 times daily, Depakote to 50 mg twice daily, Cymbalta 60 mg daily, mirtazapine 7.5 mg daily. Seroquel 25 mg (d/c'd was not home med).  AKI, resolved Poor urine output initially with creatinine rising to 3.11. Returned to baseline. Cr at end of discharge was 1.23  HFpEF  BNP initally 1,101.1>131 at end of hospitalizaiton. Was diuresed 3.2 L. End weight was 103.8 kg
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
## 2022-03-28 NOTE — Progress Notes (Signed)
Speech Language Pathology Treatment: Dysphagia  ?Patient Details ?Name: Martha Mora ?MRN: [id number] ?DOB: 08-Nov-1946 ?Today's Date: 03/28/2022 ?Time: 3664-4034 ?SLP Time Calculation (min) (ACUTE ONLY): 20 min ? ?Assessment / Plan / Recommendation ?Clinical Impression ? Patient fidgeting, anxious and trying to get out of bed. During moments of agitation her oxygen saturations decrease to mid-80's but return to mid to high 90's when calm. Patient was receptive towards oral care with toothette sponges soaked in water which helped to moisten her very dry oral mucosa. Although she did attempt to suck through straw, she was not successful and her attention was very poor resulting in her quickly ceasing attempts. She accepted water pipetted through straw and exhibited oral delay, one instance of immediate cough and suspected swallow initiation delay. RN reported that patient was able to be given oral medications last night but she has not be able to give oral meds at this time. RN and SLP discussed need for patient to have necessary medications available via IV as she isn't able to consistently take oral meds. SLP continues to recommend NPO at this time and will continue to follow for PO readiness. ? ?  ?HPI HPI: 76 year old female with pertinent PMH of COPD on 2L [place] at home, A-fib, anxiety, depression presents to [facility] ED on 5/7 with recurrent epistaxis. Needed rhino pocket and pt became anxious, confused/altered and intubated 5/8 and self extubated 5/9. BSE 01/01/22 with no s/sx dysphagia and reg/thin recommended. ?  ?   ?SLP Plan ? Continue with current plan of care ? ?  ?  ?Recommendations for follow up therapy are one component of a multi-disciplinary discharge planning process, led by the attending physician.  Recommendations may be updated based on patient status, additional functional criteria and insurance authorization. ?  ? ?Recommendations  ?Diet recommendations: NPO ?Medication Administration: Via  alternative means  ?   ?    ?   ? ? ? ? Oral Care Recommendations: Oral care QID;Staff/trained caregiver to provide oral care ?Follow Up Recommendations: Follow physician's recommendations for discharge plan and follow up therapies ?Assistance recommended at discharge: Frequent or constant Supervision/Assistance ?SLP Visit Diagnosis: Dysphagia, unspecified (R13.10) ?Plan: Continue with current plan of care ? ? ? ? ?  ?  ? ? ?Sonia Baller, MA, CCC-SLP ?Speech Therapy ? ?

## 2022-03-29 ENCOUNTER — Inpatient Hospital Stay (HOSPITAL_COMMUNITY): Payer: Medicare Other

## 2022-03-29 ENCOUNTER — Inpatient Hospital Stay: Payer: Self-pay

## 2022-03-29 DIAGNOSIS — J9601 Acute respiratory failure with hypoxia: Secondary | ICD-10-CM | POA: Diagnosis not present

## 2022-03-29 DIAGNOSIS — J9602 Acute respiratory failure with hypercapnia: Secondary | ICD-10-CM | POA: Diagnosis not present

## 2022-03-29 LAB — CBC WITH DIFFERENTIAL/PLATELET
Abs Immature Granulocytes: 0.1 10*3/uL — ABNORMAL HIGH (ref 0.00–0.07)
Basophils Absolute: 0.1 10*3/uL (ref 0.0–0.1)
Basophils Relative: 0 %
Eosinophils Absolute: 0.1 10*3/uL (ref 0.0–0.5)
Eosinophils Relative: 0 %
HCT: 28.8 % — ABNORMAL LOW (ref 36.0–46.0)
Hemoglobin: 8.6 g/dL — ABNORMAL LOW (ref 12.0–15.0)
Immature Granulocytes: 1 %
Lymphocytes Relative: 10 %
Lymphs Abs: 1.4 10*3/uL (ref 0.7–4.0)
MCH: 29.8 pg (ref 26.0–34.0)
MCHC: 29.9 g/dL — ABNORMAL LOW (ref 30.0–36.0)
MCV: 99.7 fL (ref 80.0–100.0)
Monocytes Absolute: 1.5 10*3/uL — ABNORMAL HIGH (ref 0.1–1.0)
Monocytes Relative: 11 %
Neutro Abs: 11.3 10*3/uL — ABNORMAL HIGH (ref 1.7–7.7)
Neutrophils Relative %: 78 %
Platelets: 161 10*3/uL (ref 150–400)
RBC: 2.89 MIL/uL — ABNORMAL LOW (ref 3.87–5.11)
RDW: 14.1 % (ref 11.5–15.5)
WBC: 14.4 10*3/uL — ABNORMAL HIGH (ref 4.0–10.5)
nRBC: 0.2 % (ref 0.0–0.2)

## 2022-03-29 LAB — BASIC METABOLIC PANEL
Anion gap: 12 (ref 5–15)
BUN: 29 mg/dL — ABNORMAL HIGH (ref 8–23)
CO2: 27 mmol/L (ref 22–32)
Calcium: 8.5 mg/dL — ABNORMAL LOW (ref 8.9–10.3)
Chloride: 105 mmol/L (ref 98–111)
Creatinine, Ser: 1.01 mg/dL — ABNORMAL HIGH (ref 0.44–1.00)
GFR, Estimated: 57 mL/min — ABNORMAL LOW (ref >60)
Glucose, Bld: 139 mg/dL — ABNORMAL HIGH (ref 70–99)
Potassium: 4.5 mmol/L (ref 3.5–5.1)
Sodium: 144 mmol/L (ref 135–145)

## 2022-03-29 LAB — POCT I-STAT 7, (LYTES, BLD GAS, ICA,H+H)
Acid-Base Excess: 5 mmol/L — ABNORMAL HIGH (ref 0.0–2.0)
Bicarbonate: 30.8 mmol/L — ABNORMAL HIGH (ref 20.0–28.0)
Calcium, Ion: 1.25 mmol/L (ref 1.15–1.40)
HCT: 26 % — ABNORMAL LOW (ref 36.0–46.0)
Hemoglobin: 8.8 g/dL — ABNORMAL LOW (ref 12.0–15.0)
O2 Saturation: 88 %
Patient temperature: 98.4
Potassium: 4.1 mmol/L (ref 3.5–5.1)
Sodium: 146 mmol/L — ABNORMAL HIGH (ref 135–145)
TCO2: 32 mmol/L (ref 22–32)
pCO2 arterial: 48.6 mmHg — ABNORMAL HIGH (ref 32–48)
pH, Arterial: 7.409 (ref 7.35–7.45)
pO2, Arterial: 55 mmHg — ABNORMAL LOW (ref 83–108)

## 2022-03-29 LAB — GLUCOSE, CAPILLARY
Glucose-Capillary: 144 mg/dL — ABNORMAL HIGH (ref 70–99)
Glucose-Capillary: 138 mg/dL — ABNORMAL HIGH (ref 70–99)
Glucose-Capillary: 133 mg/dL — ABNORMAL HIGH (ref 70–99)
Glucose-Capillary: 135 mg/dL — ABNORMAL HIGH (ref 70–99)
Glucose-Capillary: 137 mg/dL — ABNORMAL HIGH (ref 70–99)
Glucose-Capillary: 155 mg/dL — ABNORMAL HIGH (ref 70–99)

## 2022-03-29 LAB — MAGNESIUM: Magnesium: 2 mg/dL (ref 1.7–2.4)

## 2022-03-29 LAB — PHOSPHORUS: Phosphorus: 3.7 mg/dL (ref 2.5–4.6)

## 2022-03-29 MED ORDER — POLYETHYLENE GLYCOL 3350 17 G PO PACK
17.0000 g | PACK | Freq: Every day | ORAL | Status: DC | PRN
Start: 2022-03-29 — End: 2022-03-30
  Administered 2022-03-29: 17 g

## 2022-03-29 MED ORDER — ROSUVASTATIN CALCIUM 20 MG PO TABS: 20.0000 mg | ORAL_TABLET | Freq: Every day | ORAL | Status: DC

## 2022-03-29 MED ORDER — STERILE WATER FOR INJECTION IJ SOLN
INTRAMUSCULAR | Status: AC
  Filled 2022-03-29: qty 10

## 2022-03-29 MED ORDER — OSMOLITE 1.5 CAL PO LIQD
1000.0000 mL | ORAL | Status: DC
  Administered 2022-03-29: 1000 mL

## 2022-03-29 MED ORDER — CHLORHEXIDINE GLUCONATE 0.12 % MT SOLN
15.0000 mL | Freq: Two times a day (BID) | OROMUCOSAL | Status: DC
  Administered 2022-03-29 – 2022-04-01 (×6): 15 mL via OROMUCOSAL
  Filled 2022-03-29 (×6): qty 15

## 2022-03-29 MED ORDER — RAMELTEON 8 MG PO TABS
8.0000 mg | ORAL_TABLET | Freq: Every day | ORAL | Status: DC
  Filled 2022-03-29: qty 1

## 2022-03-29 MED ORDER — BUSPIRONE HCL 15 MG PO TABS
15.0000 mg | ORAL_TABLET | Freq: Three times a day (TID) | ORAL | Status: DC
  Administered 2022-03-29: 15 mg
  Filled 2022-03-29: qty 1

## 2022-03-29 MED ORDER — ORAL CARE MOUTH RINSE
15.0000 mL | Freq: Two times a day (BID) | OROMUCOSAL | Status: DC
  Administered 2022-03-29 – 2022-03-31 (×3): 15 mL via OROMUCOSAL

## 2022-03-29 MED ORDER — DEXMEDETOMIDINE HCL IN NACL 400 MCG/100ML IV SOLN
0.4000 ug/kg/h | INTRAVENOUS | Status: AC
  Administered 2022-03-29: 1.2 ug/kg/h via INTRAVENOUS
  Administered 2022-03-29: 1 ug/kg/h via INTRAVENOUS
  Administered 2022-03-29: 1.2 ug/kg/h via INTRAVENOUS
  Administered 2022-03-29 (×2): 1 ug/kg/h via INTRAVENOUS
  Administered 2022-03-30: 1.2 ug/kg/h via INTRAVENOUS
  Filled 2022-03-29 (×3): qty 100
  Filled 2022-03-29: qty 200
  Filled 2022-03-29: qty 100

## 2022-03-29 MED ORDER — MIRTAZAPINE 7.5 MG PO TABS
7.5000 mg | ORAL_TABLET | Freq: Every day | ORAL | Status: DC
  Administered 2022-03-29: 7.5 mg
  Filled 2022-03-29 (×2): qty 1

## 2022-03-29 MED ORDER — PREGABALIN 25 MG PO CAPS
25.0000 mg | ORAL_CAPSULE | Freq: Two times a day (BID) | ORAL | Status: DC
  Administered 2022-03-29: 25 mg
  Filled 2022-03-29: qty 1

## 2022-03-29 MED ORDER — AMIODARONE HCL 200 MG PO TABS: 100.0000 mg | ORAL_TABLET | Freq: Every day | ORAL | Status: DC

## 2022-03-29 MED ORDER — PROSOURCE TF PO LIQD
45.0000 mL | Freq: Two times a day (BID) | ORAL | Status: DC
  Administered 2022-03-29 (×2): 45 mL
  Filled 2022-03-29 (×2): qty 45

## 2022-03-29 MED ORDER — MIRTAZAPINE 7.5 MG PO TABS
7.5000 mg | ORAL_TABLET | Freq: Every day | ORAL | Status: DC
  Filled 2022-03-29: qty 1

## 2022-03-29 MED ORDER — DOCUSATE SODIUM 50 MG/5ML PO LIQD
100.0000 mg | Freq: Two times a day (BID) | ORAL | Status: DC | PRN
  Administered 2022-03-29: 100 mg

## 2022-03-29 MED ORDER — SALINE SPRAY 0.65 % NA SOLN
2.0000 | NASAL | Status: DC | PRN
  Administered 2022-03-29: 2 via NASAL
  Filled 2022-03-29: qty 44

## 2022-03-29 MED ORDER — PANTOPRAZOLE 2 MG/ML SUSPENSION: 40.0000 mg | Freq: Every day | ORAL | Status: DC

## 2022-03-29 MED ORDER — VENLAFAXINE HCL 37.5 MG PO TABS
37.5000 mg | ORAL_TABLET | Freq: Two times a day (BID) | ORAL | Status: DC
  Filled 2022-03-29 (×2): qty 1

## 2022-03-29 NOTE — Procedures (Addendum)
Cortrak ? ?Person Inserting Tube:  Martha Mora, RD ?Tube Type:  Cortrak - 43 inches ?Tube Size:  10 ?Tube Location:  Right nare ?Secured by: Clip ?Technique Used to Measure Tube Placement:  Marking at nare/corner of mouth ?Cortrak Secured At:  71 cm ? ?Cortrak Tube Team Note: ? ?Consult received to place a Cortrak feeding tube.  ?Unable to place bridle due to complete blockage of L nare. RN at bedside and unable to clear. Tube secure with tape/clip.  ? ?X-ray is required, abdominal x-ray has been ordered by the Cortrak team. Please confirm tube placement before using the Cortrak tube.  ? ?If the tube becomes dislodged please keep the tube and contact the Cortrak team at [email or web address] (password [email or web address]) for replacement.  ?If after hours and replacement cannot be delayed, place a NG tube and confirm placement with an abdominal x-ray.  ? ?Martha Mora., RD, LDN, CNSC ?See AMiON for contact information  ? ? ?

## 2022-03-29 NOTE — Evaluation (Signed)
Occupational Therapy Evaluation ?Patient Details ?Name: Martha Mora ?MRN: [id number] ?DOB: 1945/12/30 ?Today's Date: 03/29/2022 ? ? ?History of Present Illness Pt adm  5/7 with epitaxis. Pt required packing of nares. Pt became agitated and disoriented and was intubated for airway protection. Nose cauterized. Pt self extubated 5/9. Pt remained agitated with acute delirium. PMH - CAD, afib, COPD, HTN, breast cancer, anxiety, depression  ? ?Clinical Impression ?  ?Pt admitted for concerns listed above. PTA pt reported that she was fairly independent with all ADL's and functional mobility. At this time, pt presents with cognitive deficits, weakness, and decreased activity tolerance. This session pt followed approximately 50% of commands. She was unable to complete functional tasks this session due to difficulty following commands. Recommending SNF at this time, due to pt's functional abilities and need for assist at this time. OT Will follow acutely.   ?   ? ?Recommendations for follow up therapy are one component of a multi-disciplinary discharge planning process, led by the attending physician.  Recommendations may be updated based on patient status, additional functional criteria and insurance authorization.  ? ?Follow Up Recommendations ? Skilled nursing-short term rehab (<3 hours/day)  ?  ?Assistance Recommended at Discharge Frequent or constant Supervision/Assistance  ?Patient can return home with the following Two people to help with walking and/or transfers;Two people to help with bathing/dressing/bathroom;Assistance with cooking/housework;Assistance with feeding;Direct supervision/assist for medications management;Direct supervision/assist for financial management;Assist for transportation;Help with stairs or ramp for entrance ? ?  ?Functional Status Assessment ? Patient has had a recent decline in their functional status and demonstrates the ability to make significant improvements in function in a  reasonable and predictable amount of time.  ?Equipment Recommendations ? Other (comment) (TBD)  ?  ?Recommendations for Other Services   ? ? ?  ?Precautions / Restrictions Precautions ?Precautions: Fall ?Restrictions ?Weight Bearing Restrictions: No  ? ?  ? ?Mobility Bed Mobility ?  ?  ?  ?  ?  ?  ?  ?General bed mobility comments: Pt resisting assist to sit up this session ?  ? ?Transfers ?  ?  ?  ?  ?  ?  ?  ?  ?  ?General transfer comment: Deferred due to pt agitation and resistance ?  ? ?  ?Balance   ?  ?  ?  ?  ?  ?  ?  ?  ?  ?  ?  ?  ?  ?  ?  ?  ?  ?  ?   ? ?ADL either performed or assessed with clinical judgement  ? ?ADL Overall ADL's : Needs assistance/impaired ?  ?  ?  ?  ?  ?  ?  ?  ?  ?  ?  ?  ?  ?  ?  ?  ?  ?  ?  ?General ADL Comments: At this time, pt requiring max to total assist +1-2 due to cognition and difficulty following direction.  ? ? ? ?Vision Baseline Vision/History: 1 Wears glasses ?Ability to See in Adequate Light: 0 Adequate ?Vision Assessment?: Vision impaired- to be further tested in functional context ?Additional Comments: Pt appears to have difficulty tracking to the L, as well as looking to the L when OT stands on that side.  ?   ?Perception   ?  ?Praxis   ?  ? ?Pertinent Vitals/Pain Pain Assessment ?Pain Assessment: Faces ?Pain Score: 0-No pain ?Pain Intervention(s): Monitored during session  ? ? ? ?Hand Dominance Right ?  ?  Extremity/Trunk Assessment Upper Extremity Assessment ?Upper Extremity Assessment: Difficult to assess due to impaired cognition (Pt able to move all extremities however not following commands enough to see functionally or test strength.) ?  ?Lower Extremity Assessment ?Lower Extremity Assessment: Defer to PT evaluation ?  ?  ?  ?Communication Communication ?Communication: Expressive difficulties ?  ?Cognition Arousal/Alertness: Awake/alert ?Behavior During Therapy: Agitated, Restless ?Overall Cognitive Status: Difficult to assess ?  ?  ?  ?  ?  ?  ?  ?  ?  ?  ?  ?   ?  ?  ?  ?  ?General Comments: Pt following 1 step commands only occasionally. ?  ?  ?General Comments  Pt on Heated HF 70% O2, 40L ? ?  ?Exercises   ?  ?Shoulder Instructions    ? ? ?Home Living Family/patient expects to be discharged to:: Private residence ?Living Arrangements: Alone ?Available Help at Discharge: Family;Available PRN/intermittently ?Type of Home: House ?Home Access: Stairs to enter ?Entrance Stairs-Number of Steps: 1 flight ?Entrance Stairs-Rails: Right ?Home Layout: One level ?  ?  ?Bathroom Shower/Tub: Tub/shower unit ?  ?Bathroom Toilet: Standard ?  ?  ?Home Equipment: Rolling Walker (2 wheels);BSC/3in1;Rollator (4 wheels);Other (comment);Cane - single point;Shower seat ?  ?Additional Comments: has a motorized scooter, does not use at baseline ?  ? ?  ?Prior Functioning/Environment Prior Level of Function : Independent/Modified Independent;Driving ?  ?  ?  ?  ?  ?  ?Mobility Comments: Uses cane for amb ?  ?  ? ?  ?  ?OT Problem List: Decreased strength;Decreased range of motion;Decreased activity tolerance;Impaired balance (sitting and/or standing);Impaired vision/perception;Decreased coordination;Decreased cognition;Decreased safety awareness;Decreased knowledge of use of DME or AE;Cardiopulmonary status limiting activity;Impaired UE functional use;Obesity ?  ?   ?OT Treatment/Interventions: Self-care/ADL training;Therapeutic exercise;Energy conservation;Neuromuscular education;DME and/or AE instruction;Therapeutic activities;Cognitive remediation/compensation;Patient/family education;Visual/perceptual remediation/compensation;Balance training  ?  ?OT Goals(Current goals can be found in the care plan section) Acute Rehab OT Goals ?Patient Stated Goal: none stated ?OT Goal Formulation: Patient unable to participate in goal setting ?Time For Goal Achievement: 04/12/22 ?Potential to Achieve Goals: Fair ?ADL Goals ?Additional ADL Goal #1: Pt will follow 1 step commands 75%+ of each session with  minimal cuing. ?Additional ADL Goal #2: Pt will complete all aspects of bed mobility with mod A, as precursor to ADL's. ?Additional ADL Goal #3: Pt will demonstrate increased activity tolerance to perform seated grooming tasks with min A.  ?OT Frequency: Min 2X/week ?  ? ?Co-evaluation   ?  ?  ?  ?  ? ?  ?AM-PAC OT "6 Clicks" Daily Activity     ?Outcome Measure Help from another person eating meals?: A Lot ?Help from another person taking care of personal grooming?: A Lot ?Help from another person toileting, which includes using toliet, bedpan, or urinal?: Total ?Help from another person bathing (including washing, rinsing, drying)?: A Lot ?Help from another person to put on and taking off regular upper body clothing?: A Lot ?Help from another person to put on and taking off regular lower body clothing?: Total ?6 Click Score: 10 ?  ?End of Session Equipment Utilized During Treatment: Oxygen ?Nurse Communication: Mobility status ? ?Activity Tolerance: Patient limited by lethargy ?Patient left: in bed;with call bell/phone within reach;with bed alarm set ? ?OT Visit Diagnosis: Unsteadiness on feet (R26.81);Other abnormalities of gait and mobility (R26.89);Muscle weakness (generalized) (M62.81);Other symptoms and signs involving cognitive function  ?              ?  Time: 1555-1610 ?OT Time Calculation (min): 15 min ?Charges:  OT General Charges ?$OT Visit: 1 Visit ?OT Evaluation ?$OT Eval Moderate Complexity: 1 Mod ? ?Rolfe Hartsell H., OTR/L ?Acute Rehabilitation ? ?Martha Mora ?03/29/2022, 6:38 PM ?

## 2022-03-29 NOTE — Progress Notes (Signed)
Dear Doctor:  This patient has been identified as a candidate for PICC for the following reason (s): restarts due to phlebitis and infiltration in 24 hours. L arm edematous and red/ bruised. Limited options in R forearm. If you agree, please write an order for the indicated device. For any questions contact the Vascular Access Team at [phone] if no answer, please leave a message. ? ?Thank you for supporting the early vascular access assessment program. ?

## 2022-03-29 NOTE — Progress Notes (Signed)
Nutrition Follow-up ? ?DOCUMENTATION CODES:  ? ?Not applicable ? ?INTERVENTION:  ? ?- Recommend starting bowel regimen ? ?Once Cortrak placed and placement is confirmed via x-ray, initiate tube feeds: ?- Start Osmolite 1.5 @ 20 ml/hr and advance by 10 ml q 8 hours to goal rate of 50 ml/hr (1200 ml/day) ?- ProSource TF 45 ml BID ? ?Tube feeding regimen at goal rate provides 1880 kcal, 97 grams of protein, and 914 ml of H2O. ? ?Monitor magnesium, potassium, and phosphorus BID for at least 3 days, MD to replete as needed, as pt is at risk for refeeding syndrome given no nutrition x 5 days. ? ?NUTRITION DIAGNOSIS:  ? ?Inadequate oral intake related to inability to eat as evidenced by NPO status. ? ?Ongoing, being addressed via initiation of enteral nutrition ? ?GOAL:  ? ?Patient will meet greater than or equal to 90% of their needs ? ?Progressing with initiation of enteral nutrition ? ?MONITOR:  ? ?Diet advancement, Labs, Weight trends, TF tolerance, I & O's ? ?REASON FOR ASSESSMENT:  ? ?Consult ?Enteral/tube feeding initiation and management ? ?ASSESSMENT:  ? ?76 year old female who presented to the ED on 5/07 with epistaxis. PMH of COPD, atrial fibrillation, anxiety, depression, HTN, HLD, CAD, GERD. Pt required intubation in the ED due to anxiety and SOB. ? ?Discussed pt with RN and during ICU rounds. Pt remains encephalopathic and lethargic and has not had diet advanced yet. Pt has not received nutrition during admission (x 5 days). Plan for Cortrak placement today and initiation of enteral nutrition. ? ?Will start tube feed and trickle rate and slowly advance to goal. Recommend monitoring for refeeding syndrome given no nutrition x 5 days. ? ?Admit weight: 108.2 kg ?Current weight: 112.9 kg ? ?Pt with mild pitting generalized edema. ? ?Medications reviewed and include: protonix, IV abx, precedex drip ?IVF: LR @ 75 ml/hr ? ?Labs reviewed: BUN 29, creatinine 1.01, WBC 14.4, hemoglobin 8.6 ?CBG's: 133-154 x 24  hours ? ?UOP: 1275 ml x 24 hours ?I/O's: +5.6 L since admit ? ?Diet Order:   ?Diet Order   ? ?       ?  Diet NPO time specified  Diet effective now       ?  ? ?  ?  ? ?  ? ? ?EDUCATION NEEDS:  ? ?Not appropriate for education at this time ? ?Skin:  Skin Assessment: Reviewed RN Assessment (MASD abdomen) ? ?Last BM:  no documented BM ? ?Height:  ? ?Ht Readings from Last 1 Encounters:  ?03/25/22 '5\' 1"'$  (1.549 m)  ? ? ?Weight:  ? ?Wt Readings from Last 1 Encounters:  ?03/29/22 112.9 kg  ? ? ?Ideal Body Weight:  47.7 kg ? ?BMI:  Body mass index is 47.03 kg/m?. ? ?Estimated Nutritional Needs:  ? ?Kcal:  1700-1900 ? ?Protein:  90-105 grams ? ?Fluid:  1.7-1.9 L ? ? ? ?Gustavus Bryant, MS, RD, LDN ?Inpatient Clinical Dietitian ?Please see AMiON for contact information. ? ?

## 2022-03-29 NOTE — Progress Notes (Signed)
RN aware PICC will not be place today.Patient has working PIV's. ?

## 2022-03-29 NOTE — Progress Notes (Signed)
On 15L [place], Pt desaturating into low 80s. Placed Pt on [place] 38L/100%. HR 53, Sat 90-92% Will continue to monitor.  ?

## 2022-03-29 NOTE — Progress Notes (Signed)
SLP Cancellation Note ? ?Patient Details ?Name: Martha Mora ?MRN: [id number] ?DOB: [birth year] ? ? ?Cancelled treatment:        Checked on pt for possible po initiation (other than meds in applesauce). MD, RN at bedside stating she is currently too sleepy to attempt even meds. Will continue efforts. MD to reach out if she becomes more alert today.  ? ? ?[name] ?03/29/2022, 9:17 AM ?

## 2022-03-29 NOTE — Progress Notes (Signed)
eLink Physician-Brief Progress Note ?Patient Name: Martha Mora ?DOB: 13-Apr-1946 ?MRN: [id number] ? ? ?Date of Service ? 03/29/2022  ?HPI/Events of Note ? Worsening hypoxemia in setting of delirium.  Transitioned to HHF cannula.  At risk if needing intubation.  ?eICU Interventions ? Continue close observation  ? ? ? ?Intervention Category ?Major Interventions: Hypoxemia - evaluation and management ? ?[name], [name], P ?03/29/2022, 1:53 AM ?
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
## 2022-03-29 NOTE — TOC Progression Note (Signed)
Transition of Care (TOC) - Initial/Assessment Note  ? ? ?Patient Details  ?Name: Martha Mora ?MRN: [id number] ?Date of Birth: [birth year] ? ?Transition of Care (TOC) CM/SW Contact:    ?Paulene Floor Adanely Reynoso, LCSWA ?Phone Number: ?03/29/2022, 3:09 PM ? ?Clinical Narrative:                 ?Patient is agitated and oriented only to self.  ? ?TOC following patient for any d/c planning needs once medically stable.   ? ?  ?  ? ? ?Patient Goals and CMS Choice ?  ?  ?  ? ?Expected Discharge Plan and Services ?  ?  ?  ?  ?  ?                ?  ?  ?  ?  ?  ?  ?  ?  ?  ?  ? ?Prior Living Arrangements/Services ?  ?  ?  ?       ?  ?  ?  ?  ? ?Activities of Daily Living ?  ?ADL Screening (condition at time of admission) ?Patient's cognitive ability adequate to safely complete daily activities?: No ?Is the patient deaf or have difficulty hearing?: No ?Does the patient have difficulty seeing, even when wearing glasses/contacts?: No ?Does the patient have difficulty concentrating, remembering, or making decisions?: No ?Patient able to express need for assistance with ADLs?: No ?Does the patient have difficulty dressing or bathing?: Yes ?Independently performs ADLs?: No ?Does the patient have difficulty walking or climbing stairs?: Yes ?Weakness of Legs: Both ?Weakness of Arms/Hands: Both ? ?Permission Sought/Granted ?  ?  ?   ?   ?   ?   ? ?Emotional Assessment ?  ?  ?  ?  ?  ?  ? ?Admission diagnosis:  Epistaxis [R04.0] ?Nosebleed [R04.0] ?Patient Active Problem List  ? Diagnosis Date Noted  ? AKI (acute kidney injury) ([place]) 03/26/2022  ? Respiratory failure with hypoxia and hypercapnia ([place]) 03/25/2022  ? Leukocytosis   ? Epistaxis requiring cauterization 03/24/2022  ? Chronic heart failure with preserved ejection fraction ([place]) 01/10/2022  ? SCC (squamous cell carcinoma), hand, right 05/17/2021  ? Actinic keratoses 03/29/2021  ? Dysuria 03/16/2021  ? 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 Genetic testing 02/12/2021  ?  Chronic obstructive pulmonary disease ([place]) 02/05/2021  ? Lumbar radiculopathy 02/05/2021  ? Atrial fibrillation ([place]) 01/09/2021  ? Vitamin D deficiency 01/09/2021  ? Seborrheic keratoses 01/09/2021  ? Urinary urgency 11/21/2020  ? Vertigo 06/12/2020  ? Vocal cord dysfunction 06/12/2020  ? Chronic respiratory failure ([place]) 06/12/2020  ? Acute delirium   ? 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 07/16/2019  ? Renal stones 03/15/2019  ? Insomnia 09/08/2016  ? Restless leg syndrome 05/24/2015  ? History of tobacco use 05/23/2014  ? Mitral valve regurgitation 05/23/2014  ? Dysfunctional uterine bleeding 09/02/2013  ? Breast cancer of lower-outer quadrant of right female breast ([place]) 07/24/2013  ? Postmenopausal bleeding 07/14/2013  ? Osteopenia 12/27/2011  ? Anxiety 07/19/2009  ? Osteoarthritis 07/19/2009  ? IRRITABLE BOWEL SYNDROME 11/17/2008  ? COLONIC POLYPS, ADENOMATOUS 11/17/2007  ? Hyperlipidemia 11/17/2007  ? Obesity, unspecified 11/17/2007  ? Depression 11/17/2007  ? Essential hypertension 11/17/2007  ? Coronary atherosclerosis 11/17/2007  ? HEMORRHOIDS, INTERNAL 11/17/2007  ? GERD 11/17/2007  ? OSA (obstructive sleep apnea) 11/17/2007  ? Diverticulosis of colon (without mention of hemorrhage) 09/05/2005  ? ESOPHAGEAL STRICTURE 06/01/2002  ? ?PCP:  [name], DO ?Pharmacy:   ?  [id number] [place], [place] - [id number]?Phone: [phone]: [phone]? ?[facility] ?[id number]?Phone: [phone]: [phone] ? ? ? ? ?Social Determinants of Health (SDOH) Interventions ?  ? ?Readmission Risk Interventions ? ?  03/25/2022  ?  4:51 PM 01/02/2022  ? 11:24 AM 11/26/2019  ?  1:27 PM  ?Readmission Risk Prevention Plan  ?Transportation Screening Complete Complete Complete  ?PCP or Specialist Appt within 5-7 Days  Complete   ?PCP or Specialist Appt within 3-5  Days Not Complete  Complete  ?Not Complete comments Patient still urrently in ICU    ?Home Care Screening  Complete   ?Medication Review (RN CM)  Complete   ?[place] Home Care Consult Complete  Complete  ?Social Work Consult for RArkomaPlanning/Counseling Complete  Complete  ?Palliative Care Screening Not Applicable  Not Applicable  ?Medication Review (RN Care Manager) Referral to Pharmacy  Complete  ? ? ? ?

## 2022-03-29 NOTE — Progress Notes (Signed)
Speech Language Pathology Treatment: Dysphagia  ?Patient Details ?Name: Martha Mora ?MRN: [id number] ?DOB: 1946-09-28 ?Today's Date: 03/29/2022 ?Time: 1310-1320 ?SLP Time Calculation (min) (ACUTE ONLY): 10 min ? ?Assessment / Plan / Recommendation ?Clinical Impression ? Pt currently without means of nutrition and plan to check on pt when more alert. Pt woke up and took meds with RN and applesauce and reached out to this SLP. She could intermittently wake with verbal and tactile stimuli upon therapists arrival and with oral care. She demonstrated decreased oral manipulation and cohesion with delayed propulsion with applesauce. She was able to contain ice chip in her mouth, moving with tongue then swallowed mostly intact ice chip from what SLP could tell. She kept her eyes closed throughout needing reminders to "move it around and swallow." Pt did not cough although intake was limited given her lethargy. She is on HFNC 40 L, lethargic and per RN is now on schedule to receive Cortrak today. Recommend all meds be given via Cortrak and pt allowed to have ice chips after oral care when adequately awake and responsive.  ?  ?HPI HPI: 76 year old female with pertinent PMH of COPD on 2L [place] at home, A-fib, anxiety, depression presents to [facility] ED on 5/7 with recurrent epistaxis. Needed rhino pocket and pt became anxious, confused/altered and intubated 5/8 and self extubated 5/9. BSE 01/01/22 with no s/sx dysphagia and reg/thin recommended. ?  ?   ?SLP Plan ? Continue with current plan of care ? ?  ?  ?Recommendations for follow up therapy are one component of a multi-disciplinary discharge planning process, led by the attending physician.  Recommendations may be updated based on patient status, additional functional criteria and insurance authorization. ?  ? ?Recommendations  ?Diet recommendations: NPO;Other(comment) (except ice chips when awake) ?Medication Administration: Via alternative means  ?   ?    ?   ? ? ? ? Oral  Care Recommendations: Oral care QID ?Follow Up Recommendations:  (TBD) ?Assistance recommended at discharge: Frequent or constant Supervision/Assistance ?SLP Visit Diagnosis: Dysphagia, unspecified (R13.10) ?Plan: Continue with current plan of care ? ? ? ? ?  ?  ? ? ?[name] ? ?03/29/2022, 2:07 PM ?

## 2022-03-30 ENCOUNTER — Other Ambulatory Visit: Payer: Self-pay

## 2022-03-30 ENCOUNTER — Inpatient Hospital Stay (HOSPITAL_COMMUNITY): Payer: Medicare Other

## 2022-03-30 DIAGNOSIS — J9601 Acute respiratory failure with hypoxia: Secondary | ICD-10-CM | POA: Diagnosis not present

## 2022-03-30 DIAGNOSIS — J9602 Acute respiratory failure with hypercapnia: Secondary | ICD-10-CM | POA: Diagnosis not present

## 2022-03-30 LAB — CBC WITH DIFFERENTIAL/PLATELET
Abs Immature Granulocytes: 0.2 10*3/uL — ABNORMAL HIGH (ref 0.00–0.07)
Basophils Absolute: 0.1 10*3/uL (ref 0.0–0.1)
Basophils Relative: 0 %
Eosinophils Absolute: 0.1 10*3/uL (ref 0.0–0.5)
Eosinophils Relative: 0 %
HCT: 27.8 % — ABNORMAL LOW (ref 36.0–46.0)
Hemoglobin: 8.8 g/dL — ABNORMAL LOW (ref 12.0–15.0)
Immature Granulocytes: 1 %
Lymphocytes Relative: 8 %
Lymphs Abs: 1.3 10*3/uL (ref 0.7–4.0)
MCH: 30.8 pg (ref 26.0–34.0)
MCHC: 31.7 g/dL (ref 30.0–36.0)
MCV: 97.2 fL (ref 80.0–100.0)
Monocytes Absolute: 1.5 10*3/uL — ABNORMAL HIGH (ref 0.1–1.0)
Monocytes Relative: 10 %
Neutro Abs: 12.8 10*3/uL — ABNORMAL HIGH (ref 1.7–7.7)
Neutrophils Relative %: 81 %
Platelets: 232 10*3/uL (ref 150–400)
RBC: 2.86 MIL/uL — ABNORMAL LOW (ref 3.87–5.11)
RDW: 13.8 % (ref 11.5–15.5)
WBC: 15.8 10*3/uL — ABNORMAL HIGH (ref 4.0–10.5)
nRBC: 0.2 % (ref 0.0–0.2)

## 2022-03-30 LAB — POCT I-STAT 7, (LYTES, BLD GAS, ICA,H+H)
Acid-Base Excess: 5 mmol/L — ABNORMAL HIGH (ref 0.0–2.0)
Bicarbonate: 30.1 mmol/L — ABNORMAL HIGH (ref 20.0–28.0)
Calcium, Ion: 1.26 mmol/L (ref 1.15–1.40)
HCT: 28 % — ABNORMAL LOW (ref 36.0–46.0)
Hemoglobin: 9.5 g/dL — ABNORMAL LOW (ref 12.0–15.0)
O2 Saturation: 88 %
Patient temperature: 98.8
Potassium: 3.9 mmol/L (ref 3.5–5.1)
Sodium: 147 mmol/L — ABNORMAL HIGH (ref 135–145)
TCO2: 31 mmol/L (ref 22–32)
pCO2 arterial: 46 mmHg (ref 32–48)
pH, Arterial: 7.424 (ref 7.35–7.45)
pO2, Arterial: 54 mmHg — ABNORMAL LOW (ref 83–108)

## 2022-03-30 LAB — MAGNESIUM
Magnesium: 2 mg/dL (ref 1.7–2.4)
Magnesium: 1.9 mg/dL (ref 1.7–2.4)

## 2022-03-30 LAB — BASIC METABOLIC PANEL
Anion gap: 8 (ref 5–15)
BUN: 26 mg/dL — ABNORMAL HIGH (ref 8–23)
CO2: 31 mmol/L (ref 22–32)
Calcium: 8.7 mg/dL — ABNORMAL LOW (ref 8.9–10.3)
Chloride: 109 mmol/L (ref 98–111)
Creatinine, Ser: 1 mg/dL (ref 0.44–1.00)
GFR, Estimated: 58 mL/min — ABNORMAL LOW (ref >60)
Glucose, Bld: 147 mg/dL — ABNORMAL HIGH (ref 70–99)
Potassium: 3.7 mmol/L (ref 3.5–5.1)
Sodium: 148 mmol/L — ABNORMAL HIGH (ref 135–145)

## 2022-03-30 LAB — GLUCOSE, CAPILLARY
Glucose-Capillary: 142 mg/dL — ABNORMAL HIGH (ref 70–99)
Glucose-Capillary: 139 mg/dL — ABNORMAL HIGH (ref 70–99)
Glucose-Capillary: 131 mg/dL — ABNORMAL HIGH (ref 70–99)
Glucose-Capillary: 142 mg/dL — ABNORMAL HIGH (ref 70–99)
Glucose-Capillary: 148 mg/dL — ABNORMAL HIGH (ref 70–99)

## 2022-03-30 LAB — BRAIN NATRIURETIC PEPTIDE: B Natriuretic Peptide: 1101.1 pg/mL — ABNORMAL HIGH (ref 0.0–100.0)

## 2022-03-30 LAB — PHOSPHORUS
Phosphorus: 4.6 mg/dL (ref 2.5–4.6)
Phosphorus: 4.2 mg/dL (ref 2.5–4.6)

## 2022-03-30 MED ORDER — BUSPIRONE HCL 5 MG PO TABS
15.0000 mg | ORAL_TABLET | Freq: Three times a day (TID) | ORAL | Status: DC
Start: 2022-03-30 — End: 2022-04-08
  Administered 2022-03-30 – 2022-04-08 (×27): 15 mg via ORAL
  Filled 2022-03-30 (×5): qty 3
  Filled 2022-03-30: qty 1
  Filled 2022-03-30: qty 3
  Filled 2022-03-30 (×2): qty 1
  Filled 2022-03-30: qty 3
  Filled 2022-03-30: qty 1
  Filled 2022-03-30 (×2): qty 3
  Filled 2022-03-30: qty 1
  Filled 2022-03-30 (×2): qty 3
  Filled 2022-03-30: qty 1
  Filled 2022-03-30 (×3): qty 3
  Filled 2022-03-30: qty 1
  Filled 2022-03-30 (×2): qty 3
  Filled 2022-03-30: qty 1
  Filled 2022-03-30: qty 3
  Filled 2022-03-30: qty 1
  Filled 2022-03-30: qty 3

## 2022-03-30 MED ORDER — HYDROXYZINE HCL 10 MG PO TABS
10.0000 mg | ORAL_TABLET | Freq: Four times a day (QID) | ORAL | Status: DC | PRN
  Administered 2022-03-30 – 2022-04-02 (×7): 10 mg via ORAL
  Filled 2022-03-30 (×8): qty 1

## 2022-03-30 MED ORDER — MIRTAZAPINE 15 MG PO TABS
7.5000 mg | ORAL_TABLET | Freq: Every day | ORAL | Status: DC
  Administered 2022-03-30 – 2022-04-07 (×9): 7.5 mg via ORAL
  Filled 2022-03-30 (×10): qty 1

## 2022-03-30 MED ORDER — FUROSEMIDE 10 MG/ML IJ SOLN
40.0000 mg | Freq: Once | INTRAMUSCULAR | Status: AC
  Administered 2022-03-30: 40 mg via INTRAVENOUS
  Filled 2022-03-30: qty 4

## 2022-03-30 MED ORDER — DEXMEDETOMIDINE HCL IN NACL 400 MCG/100ML IV SOLN
0.4000 ug/kg/h | INTRAVENOUS | Status: DC
  Administered 2022-03-30: 0.5 ug/kg/h via INTRAVENOUS
  Administered 2022-03-30: 0.4 ug/kg/h via INTRAVENOUS
  Filled 2022-03-30: qty 100

## 2022-03-30 MED ORDER — POLYETHYLENE GLYCOL 3350 17 G PO PACK: 17.0000 g | PACK | Freq: Every day | ORAL | Status: DC | PRN

## 2022-03-30 MED ORDER — STERILE WATER FOR INJECTION IJ SOLN
INTRAMUSCULAR | Status: AC
  Administered 2022-03-30: 10 mL
  Filled 2022-03-30: qty 10

## 2022-03-30 MED ORDER — PREGABALIN 25 MG PO CAPS
25.0000 mg | ORAL_CAPSULE | Freq: Two times a day (BID) | ORAL | Status: DC
  Administered 2022-03-30 – 2022-04-08 (×18): 25 mg via ORAL
  Filled 2022-03-30 (×18): qty 1

## 2022-03-30 MED ORDER — AMIODARONE HCL 200 MG PO TABS
100.0000 mg | ORAL_TABLET | Freq: Every day | ORAL | Status: DC
  Administered 2022-03-31 – 2022-04-08 (×9): 100 mg via ORAL
  Filled 2022-03-30 (×9): qty 1

## 2022-03-30 MED ORDER — POTASSIUM CHLORIDE 10 MEQ/50ML IV SOLN
10.0000 meq | INTRAVENOUS | Status: AC
  Administered 2022-03-30 (×4): 10 meq via INTRAVENOUS
  Filled 2022-03-30 (×4): qty 50

## 2022-03-30 MED ORDER — PANTOPRAZOLE 2 MG/ML SUSPENSION
40.0000 mg | Freq: Every day | ORAL | Status: DC
  Administered 2022-03-31: 40 mg via ORAL
  Filled 2022-03-30: qty 20

## 2022-03-30 MED ORDER — VENLAFAXINE HCL 37.5 MG PO TABS
37.5000 mg | ORAL_TABLET | Freq: Two times a day (BID) | ORAL | Status: AC
  Administered 2022-03-30 – 2022-03-31 (×3): 37.5 mg via ORAL
  Filled 2022-03-30 (×3): qty 1

## 2022-03-30 MED ORDER — SODIUM CHLORIDE 0.9% FLUSH
10.0000 mL | Freq: Two times a day (BID) | INTRAVENOUS | Status: DC
  Administered 2022-03-30 – 2022-04-04 (×11): 10 mL
  Administered 2022-04-04: 3 mL
  Administered 2022-04-05 – 2022-04-08 (×6): 10 mL

## 2022-03-30 MED ORDER — ROSUVASTATIN CALCIUM 20 MG PO TABS
20.0000 mg | ORAL_TABLET | Freq: Every day | ORAL | Status: DC
  Administered 2022-03-31 – 2022-04-08 (×9): 20 mg via ORAL
  Filled 2022-03-30 (×9): qty 1

## 2022-03-30 MED ORDER — CYCLOBENZAPRINE HCL 5 MG PO TABS
5.0000 mg | ORAL_TABLET | Freq: Three times a day (TID) | ORAL | Status: DC | PRN
  Administered 2022-03-30 – 2022-03-31 (×3): 5 mg via ORAL
  Filled 2022-03-30 (×3): qty 1

## 2022-03-30 MED ORDER — SODIUM CHLORIDE 0.9% FLUSH: 10.0000 mL | INTRAVENOUS | Status: DC | PRN

## 2022-03-30 MED ORDER — MUSCLE RUB 10-15 % EX CREA
TOPICAL_CREAM | CUTANEOUS | Status: DC | PRN
  Filled 2022-03-30: qty 85

## 2022-03-30 MED ORDER — TRAMADOL HCL 50 MG PO TABS
50.0000 mg | ORAL_TABLET | Freq: Four times a day (QID) | ORAL | Status: DC | PRN
  Administered 2022-03-30 – 2022-04-05 (×9): 50 mg via ORAL
  Filled 2022-03-30 (×9): qty 1

## 2022-03-30 MED ORDER — ACETAMINOPHEN 325 MG PO TABS
650.0000 mg | ORAL_TABLET | Freq: Four times a day (QID) | ORAL | Status: DC | PRN
  Administered 2022-03-30 – 2022-04-02 (×4): 650 mg via ORAL
  Filled 2022-03-30 (×4): qty 2

## 2022-03-30 MED ORDER — ACETAMINOPHEN 10 MG/ML IV SOLN
1000.0000 mg | Freq: Four times a day (QID) | INTRAVENOUS | Status: DC | PRN
  Administered 2022-03-30: 1000 mg via INTRAVENOUS
  Filled 2022-03-30: qty 100

## 2022-03-30 MED ORDER — LIDOCAINE 5 % EX PTCH
1.0000 | MEDICATED_PATCH | CUTANEOUS | Status: DC
  Administered 2022-03-30 – 2022-04-07 (×9): 1 via TRANSDERMAL
  Filled 2022-03-30 (×10): qty 1

## 2022-03-30 NOTE — Progress Notes (Signed)
Patient complaining of shortness of breath, chest pain in center of chest, requesting MD. Duoneb prn administered. Dr. Lynetta Mare notified. 12 lead EKG obtained results read by Dr. Lynetta Mare. BNP results from this morning reviewed as well. Lasix 40 mg IVP ordered.  ?

## 2022-03-30 NOTE — Progress Notes (Signed)
Mariah RN at bedside, assisted with pt during placement due to agitation and confusion.  Alver Fisher also aware to remove all PIV's and change tubing. Notified Mariah RN per pt, she has had 17 lymph nodes removed from Right side due to breast cancer. ?

## 2022-03-30 NOTE — Progress Notes (Signed)
0100: Patient yelling that she is bleeding. Upon entering room, Cortrak was dislodged from right nare, no signs of bleeding from nares, mouth, or back of throat. [place] notified.  ? ?0130: Patient yelling, hitting side rails, tachypneic, desatting to 85%, attempted to talk to patient to de-escalate agitation, Geodon given as per order (see MAR). O2 sat still 85%, RT notified, increased HHF from 35L to 40L, patient became lethargic with labored breathing with use of accessory muscles and periods of apnea, diminished breath sounds bilaterally throughout all lung fields, Precedex decreased, ABG obtained, notified eLink. ? ?0200-0230: patient still very lethargic, response to sternal rub, bladder scan 0, 350 cc urine total output so far on shift, received order for CXR and Lasix, requested ground team to come assess pt, MD does not think necessary at the moment and "will intubate if she continues to decompensate," see MAR. ? ?6160: CXR in room, patient still lethargic, requested ground team to come assess pt, MD did not think necessary at the moment, [place] aware.  ? ?0330: chest PT, patient becoming pale, still lethargic, eLink notified.  ? ?7371-0626: patient breaths becoming shallow, eLink notified. ? ?0600: pt continues to have shallow, tachypneic breathing with use of accessory muscles. ? ?0725: pt continues to have shallow, tachypneic breathing with use of accessory muscles with brief episodes of apnea, will report to day shift RN. ? ?Lesle Reek, RN ? ? ? ? ? ? ? ? ?

## 2022-03-30 NOTE — Progress Notes (Signed)
Peripherally Inserted Central Catheter Placement ? ?The IV Nurse has discussed with the patient and/or persons authorized to consent for the patient, the purpose of this procedure and the potential benefits and risks involved with this procedure.  The benefits include less needle sticks, lab draws from the catheter, and the patient may be discharged home with the catheter. Risks include, but not limited to, infection, bleeding, blood clot (thrombus formation), and puncture of an artery; nerve damage and irregular heartbeat and possibility to perform a PICC exchange if needed/ordered by physician.  Alternatives to this procedure were also discussed.  Bard Power PICC patient education guide, fact sheet on infection prevention and patient information card has been provided to patient /or left at bedside.  Telephone consent obtained from son, Legrand Como, for PICC placement, witnessed by [employer]. ? ?PICC Placement Documentation  ?PICC Double Lumen 03/30/22 Left Brachial 48 cm 1 cm (Active)  ?Indication for Insertion or Continuance of Line Poor Vasculature-patient has had multiple peripheral attempts or PIVs lasting less than 24 hours;Limited venous access - need for IV therapy >5 days (PICC only) 03/30/22 1047  ?Exposed Catheter (cm) 1 cm 03/30/22 1047  ?Site Assessment Clean, Dry, Intact 03/30/22 1047  ?Lumen #1 Status Flushed;Saline locked;Blood return noted 03/30/22 1047  ?Lumen #2 Status Flushed;Saline locked;Blood return noted 03/30/22 1047  ?Dressing Type Transparent;Securing device 03/30/22 1047  ?Dressing Status Antimicrobial disc in place;Clean, Dry, Intact 03/30/22 1047  ?Safety Lock Not Applicable 65/68/12 7517  ?Line Care Connections checked and tightened 03/30/22 1047  ?Line Adjustment (NICU/IV Team Only) No 03/30/22 1047  ?Dressing Intervention New dressing 03/30/22 1047  ?Dressing Change Due 04/06/22 03/30/22 1047  ? ? ? ? ? ?Rolena Infante ?03/30/2022, 10:48 AM ? ?
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
## 2022-03-30 NOTE — Progress Notes (Addendum)
eLink Physician-Brief Progress Note ?Patient Name: Martha Mora ?DOB: 05/14/1946 ?MRN: [id number] ? ? ?Date of Service ? 03/30/2022  ?HPI/Events of Note ? Oxygen needs increasing and patient with delirium on precedex drip.  On HHF cannula with 90% FIO2.  Pao2 now 52.  Presently patient calm. ?Cxr with edema  ?eICU Interventions ? Dose of lasix ?Stop IVF ?If continues to deteriorate will need intubation  ? ? ? ?Intervention Category ?Major Interventions: Hypoxemia - evaluation and management ? ?KHADEEJAH, CASTNER, P ?03/30/2022, 2:20 AM ?

## 2022-03-30 NOTE — Progress Notes (Signed)
Speech Language Pathology Treatment: Dysphagia  ?Patient Details ?Name: Martha Mora ?MRN: [id number] ?DOB: 02-Sep-1946 ?Today's Date: 03/30/2022 ?Time: 3532-9924 ?SLP Time Calculation (min) (ACUTE ONLY): 20 min ? ?Assessment / Plan / Recommendation ?Clinical Impression ? Asked by RN to re-evaluate patient as she is more alert this am. Reviewed chart. Patient is more alert this am than what has been documented, remains confused, but appropriate with this SLP, able to follow commands and make needs known. She was able to consume thin liquids and pureed solids with HOH assist for feeding and no overt s/s of aspiration. Vocal quality remaining clear through out trials. Discussed with RN. Concern is that mentation fluctuates significantly and will impact aspiration risk. Recommend allowing snacks from floor, thin liquids and pureed solids, only when patient fully alert and able to participate. SLP will continue to f/u. Suspect that when patient able to maintain consistent alert status during waking hours, she will be able to advance back to a regular diet.  ?  ?HPI HPI: 76 year old female with pertinent PMH of COPD on 2L [place] at home, A-fib, anxiety, depression presents to [facility] ED on 5/7 with recurrent epistaxis. Needed rhino pocket and pt became anxious, confused/altered and intubated 5/8 and self extubated 5/9. BSE 01/01/22 with no s/sx dysphagia and reg/thin recommended. ?  ?   ?SLP Plan ? Goals updated ? ?  ?  ?Recommendations for follow up therapy are one component of a multi-disciplinary discharge planning process, led by the attending physician.  Recommendations may be updated based on patient status, additional functional criteria and insurance authorization. ?  ? ?Recommendations  ?Diet recommendations: Thin liquid ?Liquids provided via: Cup;Straw ?Medication Administration: Crushed with puree ?Supervision: Patient able to self feed;Full supervision/cueing for compensatory strategies;Staff to assist with self  feeding ?Compensations: Slow rate;Small sips/bites ?Postural Changes and/or Swallow Maneuvers: Seated upright 90 degrees  ?   ?    ?   ? ? ? ? Oral Care Recommendations: Oral care BID ?Follow Up Recommendations: No SLP follow up ?SLP Visit Diagnosis: Dysphagia, unspecified (R13.10) ?Plan: Goals updated ? ? ? ? ?  ?  ?Martha Saperstein MA, CCC-SLP ? ? ?Martha Mora Martha Mora ? ?03/30/2022, 9:57 AM ?

## 2022-03-30 NOTE — Progress Notes (Signed)
Was called to room by RN. PT was desating into 80s. Had to increased Martha Mora to 40L/100% (titrating it back down to 90% once Sats recovered). Pt agonally breathing with minimal air movement. Duoneb TX given with no perceivable improvement. Elink consulted. ABG drawn. Xray ordered. Will continue to monitor and prepare for potential intubation.  ?

## 2022-03-31 DIAGNOSIS — J9602 Acute respiratory failure with hypercapnia: Secondary | ICD-10-CM | POA: Diagnosis not present

## 2022-03-31 DIAGNOSIS — J9601 Acute respiratory failure with hypoxia: Secondary | ICD-10-CM | POA: Diagnosis not present

## 2022-03-31 LAB — CBC WITH DIFFERENTIAL/PLATELET
Abs Immature Granulocytes: 0.33 10*3/uL — ABNORMAL HIGH (ref 0.00–0.07)
Basophils Absolute: 0.1 10*3/uL (ref 0.0–0.1)
Basophils Relative: 1 %
Eosinophils Absolute: 0.1 10*3/uL (ref 0.0–0.5)
Eosinophils Relative: 1 %
HCT: 26.3 % — ABNORMAL LOW (ref 36.0–46.0)
Hemoglobin: 8 g/dL — ABNORMAL LOW (ref 12.0–15.0)
Immature Granulocytes: 2 %
Lymphocytes Relative: 13 %
Lymphs Abs: 1.8 10*3/uL (ref 0.7–4.0)
MCH: 30.1 pg (ref 26.0–34.0)
MCHC: 30.4 g/dL (ref 30.0–36.0)
MCV: 98.9 fL (ref 80.0–100.0)
Monocytes Absolute: 1.4 10*3/uL — ABNORMAL HIGH (ref 0.1–1.0)
Monocytes Relative: 10 %
Neutro Abs: 10.1 10*3/uL — ABNORMAL HIGH (ref 1.7–7.7)
Neutrophils Relative %: 73 %
Platelets: 279 10*3/uL (ref 150–400)
RBC: 2.66 MIL/uL — ABNORMAL LOW (ref 3.87–5.11)
RDW: 14.2 % (ref 11.5–15.5)
WBC: 13.8 10*3/uL — ABNORMAL HIGH (ref 4.0–10.5)
nRBC: 0.4 % — ABNORMAL HIGH (ref 0.0–0.2)

## 2022-03-31 LAB — BASIC METABOLIC PANEL
Anion gap: 8 (ref 5–15)
BUN: 19 mg/dL (ref 8–23)
CO2: 34 mmol/L — ABNORMAL HIGH (ref 22–32)
Calcium: 8.5 mg/dL — ABNORMAL LOW (ref 8.9–10.3)
Chloride: 104 mmol/L (ref 98–111)
Creatinine, Ser: 0.91 mg/dL (ref 0.44–1.00)
GFR, Estimated: >60 mL/min (ref >60)
Glucose, Bld: 134 mg/dL — ABNORMAL HIGH (ref 70–99)
Potassium: 3.3 mmol/L — ABNORMAL LOW (ref 3.5–5.1)
Sodium: 146 mmol/L — ABNORMAL HIGH (ref 135–145)

## 2022-03-31 LAB — GLUCOSE, CAPILLARY
Glucose-Capillary: 131 mg/dL — ABNORMAL HIGH (ref 70–99)
Glucose-Capillary: 125 mg/dL — ABNORMAL HIGH (ref 70–99)
Glucose-Capillary: 103 mg/dL — ABNORMAL HIGH (ref 70–99)
Glucose-Capillary: 106 mg/dL — ABNORMAL HIGH (ref 70–99)
Glucose-Capillary: 103 mg/dL — ABNORMAL HIGH (ref 70–99)
Glucose-Capillary: 102 mg/dL — ABNORMAL HIGH (ref 70–99)
Glucose-Capillary: 117 mg/dL — ABNORMAL HIGH (ref 70–99)

## 2022-03-31 LAB — PHOSPHORUS
Phosphorus: 4.5 mg/dL (ref 2.5–4.6)
Phosphorus: 3.2 mg/dL (ref 2.5–4.6)

## 2022-03-31 LAB — MAGNESIUM
Magnesium: 2 mg/dL (ref 1.7–2.4)
Magnesium: 2 mg/dL (ref 1.7–2.4)

## 2022-03-31 MED ORDER — DULOXETINE HCL 60 MG PO CPEP
60.0000 mg | ORAL_CAPSULE | Freq: Every day | ORAL | Status: DC
  Administered 2022-04-01 – 2022-04-08 (×8): 60 mg via ORAL
  Filled 2022-03-31 (×8): qty 1

## 2022-03-31 MED ORDER — DIVALPROEX SODIUM 500 MG PO DR TAB
500.0000 mg | DELAYED_RELEASE_TABLET | Freq: Two times a day (BID) | ORAL | Status: DC
  Administered 2022-03-31 – 2022-04-01 (×3): 500 mg via ORAL
  Filled 2022-03-31 (×5): qty 1

## 2022-03-31 MED ORDER — ENOXAPARIN SODIUM 40 MG/0.4ML IJ SOSY
40.0000 mg | PREFILLED_SYRINGE | Freq: Every day | INTRAMUSCULAR | Status: DC
  Administered 2022-03-31 – 2022-04-01 (×2): 40 mg via SUBCUTANEOUS
  Filled 2022-03-31: qty 0.4

## 2022-03-31 MED ORDER — QUETIAPINE FUMARATE 25 MG PO TABS
25.0000 mg | ORAL_TABLET | Freq: Every day | ORAL | Status: DC
  Administered 2022-03-31 – 2022-04-05 (×6): 25 mg via ORAL
  Filled 2022-03-31 (×6): qty 1

## 2022-03-31 MED ORDER — POTASSIUM CHLORIDE 10 MEQ/50ML IV SOLN
10.0000 meq | INTRAVENOUS | Status: AC
  Administered 2022-03-31 (×3): 10 meq via INTRAVENOUS
  Filled 2022-03-31 (×4): qty 50

## 2022-03-31 MED ORDER — POTASSIUM CHLORIDE 10 MEQ/50ML IV SOLN
INTRAVENOUS | Status: AC
  Administered 2022-03-31: 10 meq via INTRAVENOUS
  Filled 2022-03-31: qty 50

## 2022-03-31 MED ORDER — PANTOPRAZOLE SODIUM 40 MG PO TBEC
40.0000 mg | DELAYED_RELEASE_TABLET | Freq: Every day | ORAL | Status: DC
  Administered 2022-04-01 – 2022-04-08 (×8): 40 mg via ORAL
  Filled 2022-03-31 (×8): qty 1

## 2022-03-31 MED ORDER — SODIUM CHLORIDE 0.9 % IV SOLN
12.5000 mg | Freq: Four times a day (QID) | INTRAVENOUS | Status: DC | PRN
  Administered 2022-03-31: 12.5 mg via INTRAVENOUS
  Filled 2022-03-31: qty 0.5

## 2022-03-31 MED ORDER — POTASSIUM CHLORIDE CRYS ER 20 MEQ PO TBCR
20.0000 meq | EXTENDED_RELEASE_TABLET | ORAL | Status: AC
  Administered 2022-03-31 (×2): 20 meq via ORAL
  Filled 2022-03-31 (×2): qty 1

## 2022-03-31 MED ORDER — HYDRALAZINE HCL 20 MG/ML IJ SOLN
10.0000 mg | INTRAMUSCULAR | Status: DC | PRN
  Administered 2022-03-31 – 2022-04-02 (×5): 10 mg via INTRAVENOUS
  Filled 2022-03-31 (×5): qty 1

## 2022-03-31 NOTE — Progress Notes (Signed)
[facility] ADULT ICU REPLACEMENT PROTOCOL ? ? ?The patient does apply for the [facility] Adult ICU Electrolyte Replacment Protocol based on the criteria listed below:  ? ?1.Exclusion criteria: TCTS patients, ECMO patients, and Dialysis patients ?2. Is GFR >/= 30 ml/min? Yes.    ?Patient's GFR today is >60 ?3. Is SCr </= 2? Yes.   ?Patient's SCr is 0.91 mg/dL ?4. Did SCr increase >/= 0.5 in 24 hours? No. ?5.Pt's weight >40kg  Yes.   ?6. Abnormal electrolyte(s): K  ?7. Electrolytes replaced per protocol ?8.  Call MD STAT for K+ </= 2.5, Phos </= 1, or Mag </= 1 ?Physician:  Burnard Leigh ? ?Martha Mora 03/31/2022 4:34 AM  ?

## 2022-03-31 NOTE — Assessment & Plan Note (Signed)
As above.
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
## 2022-03-31 NOTE — Progress Notes (Signed)
Speech Language Pathology Treatment: Dysphagia  ?Patient Details ?Name: Martha Mora ?MRN: [id number] ?DOB: Dec 21, 1945 ?Today's Date: 03/31/2022 ?Time: 1010-1027 ?SLP Time Calculation (min) (ACUTE ONLY): 17 min ? ?Assessment / Plan / Recommendation ?Clinical Impression ? Patient seen for f/u diet tolerance assessment s/p evaluation 5/13. Patient continues to be anxious however has remained alert per RN without significant mentation fluctuations. She was able to self feed clinician provided trials of dysphagia 3 and thin liquid without overt s/s of aspiration. Mildly prolonged but functional mastication of soft solids noted due to missing dentition. Dentures are in room but are coated in dried blood. Tablet and water placed in denture cup to begin soaking and cleaning process. Overall, current diet remains appropriate for patient. With improved anxiety, WOB, and dentures in place, patient may advance to regular diet if she wishes without SLP f/u. No further acute SLP needs indicated.  ?  ?HPI HPI: 76 year old female with pertinent PMH of COPD on 2L [place] at home, A-fib, anxiety, depression presents to [facility] ED on 5/7 with recurrent epistaxis. Needed rhino pocket and pt became anxious, confused/altered and intubated 5/8 and self extubated 5/9. BSE 01/01/22 with no s/sx dysphagia and reg/thin recommended. ?  ?   ?SLP Plan ? All goals met ? ?  ?  ?Recommendations for follow up therapy are one component of a multi-disciplinary discharge planning process, led by the attending physician.  Recommendations may be updated based on patient status, additional functional criteria and insurance authorization. ?  ? ?Recommendations  ?Diet recommendations: Dysphagia 3 (mechanical soft);Thin liquid ?Liquids provided via: Cup;Straw ?Medication Administration: Whole meds with liquid ?Supervision: Patient able to self feed;Intermittent supervision to cue for compensatory strategies ?Compensations: Slow rate;Small sips/bites ?Postural  Changes and/or Swallow Maneuvers: Seated upright 90 degrees  ?   ?    ?   ? ? ? ? Oral Care Recommendations: Oral care BID ?Follow Up Recommendations: No SLP follow up ?Assistance recommended at discharge: None ?SLP Visit Diagnosis: Dysphagia, unspecified (R13.10) ?Plan: All goals met ? ? ? ? ?  ?  ?Bronwen Pendergraft MA, CCC-SLP ? ? ?Martha Mora ? ?03/31/2022, 10:30 AM ?

## 2022-03-31 NOTE — Progress Notes (Signed)
RT instructed patient on the use of a flutter valve. Patient able to demonstrate back good technique. 

## 2022-04-01 ENCOUNTER — Encounter (INDEPENDENT_AMBULATORY_CARE_PROVIDER_SITE_OTHER): Payer: Medicare Other | Admitting: Ophthalmology

## 2022-04-01 ENCOUNTER — Encounter (HOSPITAL_COMMUNITY): Payer: Self-pay | Admitting: Internal Medicine

## 2022-04-01 DIAGNOSIS — R040 Epistaxis: Secondary | ICD-10-CM | POA: Diagnosis not present

## 2022-04-01 LAB — BASIC METABOLIC PANEL
Anion gap: 8 (ref 5–15)
BUN: 15 mg/dL (ref 8–23)
CO2: 32 mmol/L (ref 22–32)
Calcium: 8.8 mg/dL — ABNORMAL LOW (ref 8.9–10.3)
Chloride: 107 mmol/L (ref 98–111)
Creatinine, Ser: 0.86 mg/dL (ref 0.44–1.00)
GFR, Estimated: >60 mL/min (ref >60)
Glucose, Bld: 115 mg/dL — ABNORMAL HIGH (ref 70–99)
Potassium: 4 mmol/L (ref 3.5–5.1)
Sodium: 147 mmol/L — ABNORMAL HIGH (ref 135–145)

## 2022-04-01 LAB — MAGNESIUM: Magnesium: 2.1 mg/dL (ref 1.7–2.4)

## 2022-04-01 LAB — CBC
HCT: 29.2 % — ABNORMAL LOW (ref 36.0–46.0)
Hemoglobin: 8.5 g/dL — ABNORMAL LOW (ref 12.0–15.0)
MCH: 29.7 pg (ref 26.0–34.0)
MCHC: 29.1 g/dL — ABNORMAL LOW (ref 30.0–36.0)
MCV: 102.1 fL — ABNORMAL HIGH (ref 80.0–100.0)
Platelets: 329 10*3/uL (ref 150–400)
RBC: 2.86 MIL/uL — ABNORMAL LOW (ref 3.87–5.11)
RDW: 14.4 % (ref 11.5–15.5)
WBC: 13.8 10*3/uL — ABNORMAL HIGH (ref 4.0–10.5)
nRBC: 0.7 % — ABNORMAL HIGH (ref 0.0–0.2)

## 2022-04-01 LAB — GLUCOSE, CAPILLARY
Glucose-Capillary: 110 mg/dL — ABNORMAL HIGH (ref 70–99)
Glucose-Capillary: 136 mg/dL — ABNORMAL HIGH (ref 70–99)
Glucose-Capillary: 123 mg/dL — ABNORMAL HIGH (ref 70–99)
Glucose-Capillary: 131 mg/dL — ABNORMAL HIGH (ref 70–99)
Glucose-Capillary: 103 mg/dL — ABNORMAL HIGH (ref 70–99)
Glucose-Capillary: 110 mg/dL — ABNORMAL HIGH (ref 70–99)

## 2022-04-01 LAB — BRAIN NATRIURETIC PEPTIDE: B Natriuretic Peptide: 694.5 pg/mL — ABNORMAL HIGH (ref 0.0–100.0)

## 2022-04-01 LAB — PHOSPHORUS: Phosphorus: 4.8 mg/dL — ABNORMAL HIGH (ref 2.5–4.6)

## 2022-04-01 MED ORDER — HEPARIN (PORCINE) 25000 UT/250ML-% IV SOLN
1500.0000 [IU]/h | INTRAVENOUS | Status: DC
  Administered 2022-04-02: 1000 [IU]/h via INTRAVENOUS
  Administered 2022-04-03: 1500 [IU]/h via INTRAVENOUS
  Filled 2022-04-01 (×2): qty 250

## 2022-04-01 MED ORDER — AMLODIPINE BESYLATE 5 MG PO TABS
5.0000 mg | ORAL_TABLET | Freq: Every day | ORAL | Status: DC
Start: 2022-04-01 — End: 2022-04-08
  Administered 2022-04-01 – 2022-04-08 (×8): 5 mg via ORAL
  Filled 2022-04-01 (×8): qty 1

## 2022-04-01 MED ORDER — METOPROLOL TARTRATE 12.5 MG HALF TABLET
12.5000 mg | ORAL_TABLET | Freq: Two times a day (BID) | ORAL | Status: DC
Start: 2022-04-01 — End: 2022-04-08
  Administered 2022-04-01 – 2022-04-08 (×14): 12.5 mg via ORAL
  Filled 2022-04-01 (×14): qty 1

## 2022-04-01 MED ORDER — ORAL CARE MOUTH RINSE
15.0000 mL | Freq: Two times a day (BID) | OROMUCOSAL | Status: DC
  Administered 2022-04-01 – 2022-04-08 (×12): 15 mL via OROMUCOSAL

## 2022-04-01 NOTE — Progress Notes (Signed)
? ?NAME:  Siena Poehler, MRN:  [id number], DOB:  27-Jul-1946, LOS: 8 ?ADMISSION DATE:  03/24/2022, CONSULTATION DATE:  5/7 ?REFERRING MD:  Dr. Jinny Sanders CHIEF COMPLAINT:  Epistaxis, respiratory failure  ? ?History of Present Illness:  ?Patient is a 76 year old female with pertinent PMH of COPD on 2L [place] at home, A-fib on Eliquis, anxiety, depression presents to [facility] ED on 5/7 with epistaxis. ? ?Patient's son states that patient wears oxygen without humidity at home and is on Eliquis.  Patient had nosebleed about 2 days ago that stopped with pressure.  On 5/7 patient's nosebleed began again and would not stop.  Patient came to [facility] ED.  Patient denies SOB or chest pain. Vitals stable. Initially dried blood clots in both nares.  Afrin placed in both nares.  Pressure was held for 15 minutes and bleeding initially stopped.  Few hours later patient's meds started to bleed again from left nare> right.  Rhino Rocket placed with Afrin in left nare.  Patient began to experience anxiety after packing placed.  Patient given Ativan.  Patient became more altered and confused.  Patient was intubated for airway protection.  ENT consulted.  PCCM consulted for ICU admission. ? ?Past Medical History:  ?CAD ?AF  ?MI  ?HTN ?HLD ?Anxiety / Depression  ?COPD  ?Reflux Esophagitis  ?Obesity  ?UTI ?Breast Cancer   ? ?Significant Hospital Events:  ?5/7 admitted to [facility] with epistaxis; intubated for airway protection; ENT consulted  ?5/8 bleeding right nare cauterized by and balloon packing placed. Epistat balloon came out. Also started on NE overnight for soft pressures.  ?5/9 self extubated now on NRB, weaned from pressors   ?5/10 on and off levophed for soft pressures, started on precedex for agitation ?5/13 continues to have agitated delirium.  On heated high flow nasal cannula. ?5/14 Remained calm and redirectable with some baseline anxiety overnight off Precedex.  Musculoskeletal pain has improved with local measures ? ?Consults:   ?ENT ? ?Interim History / Subjective:  ?Afebrile  ?Worked with SLP 5/14, D3 diet, thin liquids ?On 60% / 25L heated high flow  ?Pt denies acute c/o's  ? ?Objective   ?Blood pressure (!) 167/60, pulse 63, temperature 97.8 ?F (36.6 ?C), temperature source Oral, resp. rate 16, height '5\' 1"'$  (1.549 m), weight 108.8 kg, SpO2 97 %. ? ? ?Intake/Output Summary (Last 24 hours) at 04/01/2022 0716 ?Last data filed at 04/01/2022 0600 ?Gross per 24 hour  ?Intake 2946.52 ml  ?Output 750 ml  ?Net 2196.52 ml  ? ?Filed Weights  ? 03/29/22 0500 03/30/22 0500 03/31/22 0500  ?Weight: 112.9 kg 112.8 kg 108.8 kg  ? ? ?Examination: ?General: adult female sitting up in chair in NAD  ?HEENT: MM pink/moist, [place] O2, anicteric  ?Neuro: AAOx4, speech clear, MAE ?CV: s1s2 RRR, SR 80's on monitor, no m/r/g ?PULM: non-labored at rest, good air entry, bibasilar crackles posteriorly  ?GI: soft, bsx4 active  ?Extremities: warm/dry, trace peripheral edema  ?Skin: no rashes or lesions ? ? ?Resolved Hospital Problem list   ?Epistaxis  ?Fever  ?Oliguria ?Hypotension  ? ?Assessment & Plan:  ? ?Epistaxis requiring cauterization ?No obvious bleeding at this time.  S/p Epistat deflated and removed. ?-monitor for further bleeding ?-nasal saline for moisture ?-wean O2 to lowest amount necessary  ? ?Acute Respiratory failure with hypoxia ([place]) ?Previously intubated for agitation and hypoventilation.  Has been on heated high flow since self extubation.  Suspect combination of aspiration during initial episode of epistaxis plus component of volume overload  at this time on possible background of COPD ?-heated high flow O2 ?-wean O2 for sats >88% ?-pulmonary hygiene -IS, mobilize  ?-follow intermittent CXR  ?-lasix 40 mg IV x1  ?-Duoneb PRN  ?-follow BNP  ? ?OSA (obstructive sleep apnea) ?Hypercarbic on admit.  Possible nonadherence to BiPAP.  Possible oversedation from medication. ?-nocturnal CPAP QHS ? ?Atrial fibrillation ([place]) ?On apixaban at home.  Currently in  sinus rhythm on oral amiodarone. ?-continue amiodarone  ?-apixiban held x1 week ?-resume IV heparin in am 5/16, plan to observe on full dose anticoagulation for 24-48 hours then resume apixiban. Discussed anticoagulation plan with pharmacy.  ? ?AKI (acute kidney injury) ([place]) ?Poor urine output initially with creatinine rising to 3.11.  Now back to baseline, but possibly volume overloaded given BNP of 1100 ?-Trend BMP / urinary output ?-Replace electrolytes as indicated ?-Avoid nephrotoxic agents, ensure adequate renal perfusion ? ?Acute Delirium ?Anxiety / Depression  ?Baseline anxiety.  Now off Precedex ?-continue lyrica, seroquel, cymalta, depakote, flexeril  ?-stop PRN geodon  ?-consider weaning depakote in next few days  ?-frequent reorientation  ? ? ?Best practice (evaluated daily)  ?Diet: Advance diet ?Pain/Anxiety/Delirium protocol (if indicated): n/a  ?VAP protocol (if indicated): n/a ?DVT prophylaxis: lovenox, transition to full dose anticoagulation 5/16 ?GI prophylaxis: none ?Glucose control: none ?Disposition: ICU ?Last date of multidisciplinary goals of care discussion: 5/12 ?Code Status: Full Code  ? ?Critical Care Time: 34 minutes    ? ? ?Noe Gens, MSN, APRN, NP-C, AGACNP-BC ?[facility] ?04/01/2022, 7:21 AM ? ? ?Please see [email or web address] for pager details.  ? ?From 7A-7P if no response, please call [phone] ?After hours, please call Warren Lacy [phone] ? ? ? ?

## 2022-04-01 NOTE — Progress Notes (Signed)
Physical Therapy Treatment ?Patient Details ?Name: Martha Mora ?MRN: [id number] ?DOB: 18-Aug-1946 ?Today's Date: 04/01/2022 ? ? ?History of Present Illness Pt adm  5/7 with epitaxis. Pt required packing of nares. Pt became agitated and disoriented and was intubated for airway protection. Nose cauterized. Pt self extubated 5/9. Pt remained agitated with acute delirium. PMH - CAD, afib, COPD, HTN, breast cancer, anxiety, depression ? ?  ?PT Comments  ? ? Pt much improved cognition but does continue to have poor insight into deficits (wanted to remove O2 during amb) and is impulsive. Currently recommending SNF but if pt can have some supervision/assistance at home she likely could return there.   ?Recommendations for follow up therapy are one component of a multi-disciplinary discharge planning process, led by the attending physician.  Recommendations may be updated based on patient status, additional functional criteria and insurance authorization. ? ?Follow Up Recommendations ? Skilled nursing-short term rehab (<3 hours/day) ?  ?  ?Assistance Recommended at Discharge Frequent or constant Supervision/Assistance  ?Patient can return home with the following Help with stairs or ramp for entrance;Assist for transportation;A little help with walking and/or transfers;A little help with bathing/dressing/bathroom;Assistance with cooking/housework;Direct supervision/assist for medications management ?  ?Equipment Recommendations ? None recommended by PT  ?  ?Recommendations for Other Services   ? ? ?  ?Precautions / Restrictions Precautions ?Precautions: Fall  ?  ? ?Mobility ? Bed Mobility ?Overal bed mobility: Needs Assistance ?Bed Mobility: Supine to Sit, Sit to Supine ?  ?  ?Supine to sit: HOB elevated, Supervision ?Sit to supine: Supervision ?  ?General bed mobility comments: Assist for safety ?  ? ?Transfers ?Overall transfer level: Needs assistance ?Equipment used: Rollator (4 wheels) ?Transfers: Sit to/from  Stand ?Sit to Stand: Min guard ?  ?  ?  ?  ?  ?General transfer comment: Assist for safety and lines. ?  ? ?Ambulation/Gait ?Ambulation/Gait assistance: Min guard ?Gait Distance (Feet): 15 Feet (x 3) ?Assistive device: Rollator (4 wheels) ?Gait Pattern/deviations: Step-through pattern, Decreased stride length, Trunk flexed ?Gait velocity: decr ?Gait velocity interpretation: 1.31 - 2.62 ft/sec, indicative of limited community ambulator ?  ?General Gait Details: Assist for safety and lines. Verbal cues to keep in. ? ? ?Stairs ?  ?  ?  ?  ?  ? ? ?Wheelchair Mobility ?  ? ?Modified Rankin (Stroke Patients Only) ?  ? ? ?  ?Balance Overall balance assessment: Needs assistance ?Sitting-balance support: No upper extremity supported, Feet supported ?Sitting balance-Leahy Scale: Fair ?  ?  ?  ?  ?  ?  ?  ?  ?  ?  ?  ?  ?  ?  ?  ?  ?  ? ?  ?Cognition Arousal/Alertness: Awake/alert ?Behavior During Therapy: Restless, Anxious ?Overall Cognitive Status: Impaired/Different from baseline ?Area of Impairment: Attention, Memory, Safety/judgement, Awareness, Problem solving ?  ?  ?  ?  ?  ?  ?  ?  ?  ?Current Attention Level: Sustained ?Memory: Decreased short-term memory ?  ?Safety/Judgement: Decreased awareness of safety, Decreased awareness of deficits ?Awareness: Emergent ?Problem Solving: Requires verbal cues ?  ?  ?  ? ?  ?Exercises   ? ?  ?General Comments General comments (skin integrity, edema, etc.): Pt on HHFNC at 25L 60%. SpO2 86-89% with amb and returns to >90% after sitting rest break ?  ?  ? ?Pertinent Vitals/Pain Pain Assessment ?Pain Assessment: No/denies pain ?Faces Pain Scale: No hurt  ? ? ?Home Living Family/patient expects to be discharged  to:: Private residence ?Living Arrangements: Alone ?Available Help at Discharge: Family;Available PRN/intermittently ?Type of Home: House ?Home Access: Stairs to enter ?Entrance Stairs-Rails: Right ?Entrance Stairs-Number of Steps: 1 flight ?  ?Home Layout: One level ?Home  Equipment: Rolling Walker (2 wheels);BSC/3in1;Rollator (4 wheels);Other (comment);Cane - single point;Shower seat ?Additional Comments: has a motorized scooter, does not use at baseline  ?  ?Prior Function    ?  ?  ?   ? ?PT Goals (current goals can now be found in the care plan section) Progress towards PT goals: Progressing toward goals ? ?  ?Frequency ? ? ? Min 3X/week ? ? ? ?  ?PT Plan Current plan remains appropriate  ? ? ?Co-evaluation   ?  ?  ?  ?  ? ?  ?AM-PAC PT "6 Clicks" Mobility   ?Outcome Measure ? Help needed turning from your back to your side while in a flat bed without using bedrails?: A Little ?Help needed moving from lying on your back to sitting on the side of a flat bed without using bedrails?: A Little ?Help needed moving to and from a bed to a chair (including a wheelchair)?: A Little ?Help needed standing up from a chair using your arms (e.g., wheelchair or bedside chair)?: A Little ?Help needed to walk in hospital room?: Total ?Help needed climbing 3-5 steps with a railing? : Total ?6 Click Score: 14 ? ?  ?End of Session Equipment Utilized During Treatment: Oxygen ?Activity Tolerance: Other (comment) (Treatment limited by [facility].) ?Patient left: in bed;with call bell/phone within reach;with bed alarm set ?Nurse Communication: Mobility status ?PT Visit Diagnosis: Other abnormalities of gait and mobility (R26.89) ?  ? ? ?Time: 1287-8676 ?PT Time Calculation (min) (ACUTE ONLY): 26 min ? ?Charges:  $Gait Training: 23-37 mins          ?          ? ?[facility] PT ?Acute Rehabilitation Services ?Office [phone] ? ? ? ?Shary Decamp [facility] ?04/01/2022, 3:59 PM ? ?
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
## 2022-04-02 DIAGNOSIS — R040 Epistaxis: Secondary | ICD-10-CM | POA: Diagnosis not present

## 2022-04-02 LAB — BASIC METABOLIC PANEL
Anion gap: 6 (ref 5–15)
BUN: 16 mg/dL (ref 8–23)
CO2: 33 mmol/L — ABNORMAL HIGH (ref 22–32)
Calcium: 8.8 mg/dL — ABNORMAL LOW (ref 8.9–10.3)
Chloride: 104 mmol/L (ref 98–111)
Creatinine, Ser: 0.84 mg/dL (ref 0.44–1.00)
GFR, Estimated: >60 mL/min (ref >60)
Glucose, Bld: 116 mg/dL — ABNORMAL HIGH (ref 70–99)
Potassium: 3.7 mmol/L (ref 3.5–5.1)
Sodium: 143 mmol/L (ref 135–145)

## 2022-04-02 LAB — CBC
HCT: 28.7 % — ABNORMAL LOW (ref 36.0–46.0)
Hemoglobin: 8.5 g/dL — ABNORMAL LOW (ref 12.0–15.0)
MCH: 29.8 pg (ref 26.0–34.0)
MCHC: 29.6 g/dL — ABNORMAL LOW (ref 30.0–36.0)
MCV: 100.7 fL — ABNORMAL HIGH (ref 80.0–100.0)
Platelets: 351 10*3/uL (ref 150–400)
RBC: 2.85 MIL/uL — ABNORMAL LOW (ref 3.87–5.11)
RDW: 14.4 % (ref 11.5–15.5)
WBC: 13.9 10*3/uL — ABNORMAL HIGH (ref 4.0–10.5)
nRBC: 0.5 % — ABNORMAL HIGH (ref 0.0–0.2)

## 2022-04-02 LAB — HEPARIN LEVEL (UNFRACTIONATED): Heparin Unfractionated: <0.1 IU/mL — ABNORMAL LOW (ref 0.30–0.70)

## 2022-04-02 LAB — GLUCOSE, CAPILLARY
Glucose-Capillary: 100 mg/dL — ABNORMAL HIGH (ref 70–99)
Glucose-Capillary: 120 mg/dL — ABNORMAL HIGH (ref 70–99)
Glucose-Capillary: 122 mg/dL — ABNORMAL HIGH (ref 70–99)
Glucose-Capillary: 123 mg/dL — ABNORMAL HIGH (ref 70–99)
Glucose-Capillary: 129 mg/dL — ABNORMAL HIGH (ref 70–99)
Glucose-Capillary: 129 mg/dL — ABNORMAL HIGH (ref 70–99)

## 2022-04-02 MED ORDER — ENSURE ENLIVE PO LIQD
237.0000 mL | Freq: Two times a day (BID) | ORAL | Status: DC
  Administered 2022-04-02 – 2022-04-07 (×8): 237 mL via ORAL

## 2022-04-02 MED ORDER — DIVALPROEX SODIUM 250 MG PO DR TAB
250.0000 mg | DELAYED_RELEASE_TABLET | Freq: Two times a day (BID) | ORAL | Status: AC
  Administered 2022-04-02 – 2022-04-04 (×6): 250 mg via ORAL
  Filled 2022-04-02 (×7): qty 1

## 2022-04-02 MED ORDER — ADULT MULTIVITAMIN W/MINERALS CH
1.0000 | ORAL_TABLET | Freq: Every day | ORAL | Status: DC
  Administered 2022-04-02 – 2022-04-08 (×7): 1 via ORAL
  Filled 2022-04-02 (×7): qty 1

## 2022-04-02 MED ORDER — FUROSEMIDE 10 MG/ML IJ SOLN
40.0000 mg | Freq: Once | INTRAMUSCULAR | Status: AC
Start: 2022-04-02 — End: 2022-04-02
  Administered 2022-04-02: 40 mg via INTRAVENOUS
  Filled 2022-04-02: qty 4

## 2022-04-02 NOTE — Progress Notes (Addendum)
? ?NAME:  Martha Mora, MRN:  [id number], DOB:  October 10, 1946, LOS: 9 ?ADMISSION DATE:  03/24/2022, CONSULTATION DATE:  5/7 ?REFERRING MD:  Dr. Jinny Sanders CHIEF COMPLAINT:  Epistaxis, respiratory failure  ? ?History of Present Illness:  ?Patient is a 76 year old female with pertinent PMH of COPD on 2L [place] at home, A-fib on Eliquis, anxiety, depression presents to [facility] ED on 5/7 with epistaxis. ? ?Patient's son states that patient wears oxygen without humidity at home and is on Eliquis.  Patient had nosebleed about 2 days ago that stopped with pressure.  On 5/7 patient's nosebleed began again and would not stop.  Patient came to [facility] ED.  Patient denies SOB or chest pain. Vitals stable. Initially dried blood clots in both nares.  Afrin placed in both nares.  Pressure was held for 15 minutes and bleeding initially stopped.  Few hours later patient's meds started to bleed again from left nare> right.  Rhino Rocket placed with Afrin in left nare.  Patient began to experience anxiety after packing placed.  Patient given Ativan.  Patient became more altered and confused.  Patient was intubated for airway protection.  ENT consulted.  PCCM consulted for ICU admission. ? ?Past Medical History:  ?CAD ?AF  ?MI  ?HTN ?HLD ?Anxiety / Depression  ?COPD  ?Reflux Esophagitis  ?Obesity  ?UTI ?Breast Cancer   ? ?Significant Hospital Events:  ?5/7 admitted to [facility] with epistaxis; intubated for airway protection; ENT consulted  ?5/8 bleeding right nare cauterized by and balloon packing placed. Epistat balloon came out. Also started on NE overnight for soft pressures.  ?5/9 self extubated now on NRB, weaned from pressors   ?5/10 on and off levophed for soft pressures, started on precedex for agitation ?5/13 continues to have agitated delirium.  On heated high flow nasal cannula. ?5/14 Remained calm and redirectable with some baseline anxiety overnight off Precedex.  Musculoskeletal pain has improved with local measures ? ?Consults:   ?ENT ? ?Interim History / Subjective:  ?Remains on heated HFNC 60% and 25L ? ?Objective   ?Blood pressure (!) 167/58, pulse 88, temperature 98.2 ?F (36.8 ?C), temperature source Oral, resp. rate 20, height '5\' 1"'$  (1.549 m), weight 110.8 kg, SpO2 95 %. ? ? ?Intake/Output Summary (Last 24 hours) at 04/02/2022 0706 ?Last data filed at 04/02/2022 0500 ?Gross per 24 hour  ?Intake 836.18 ml  ?Output 200 ml  ?Net 636.18 ml  ? ? ?Filed Weights  ? 03/30/22 0500 03/31/22 0500 04/02/22 0500  ?Weight: 112.8 kg 108.8 kg 110.8 kg  ? ? ?Examination: ?General:   up in chair in NAD on HHFNC ?HEENT: MM pink/moist; HHFNC in place ?Neuro: Aox3; MAE ?CV: s1s2, rate 80s, no m/r/g ?PULM:  dim clear BS bilaterally; Heated HFNC 60% 25L ?GI: soft, bsx4 active  ?Extremities: warm/dry, trace peripheral edema  ?Skin: no rashes or lesions appreciated ? ?Resolved Hospital Problem list   ?Epistaxis  ?Fever  ?Oliguria ?Hypotension  ? ?Assessment & Plan:  ? ?Epistaxis requiring cauterization ?No obvious bleeding at this time.  S/p Epistat deflated and removed. ?P: ?-will restart heparin today for afib; closely monitor for bleeding ?-cont nasal saline ?-cont humidity with O2 ? ?Acute Respiratory failure with hypoxia ([place]) ?Previously intubated for agitation and hypoventilation.  Has been on heated high flow since self extubation.  Suspect combination of aspiration during initial episode of epistaxis plus component of volume overload at this time on possible background of COPD ?-on 2.5 L [place] at home ?P: ?-cont to wean HHFNC  for sats >88% ?-pulm toiletry: IS, flutter ?-PT/OT ?-duoneb prn for wheezing ?-lasix prn; trend BNP ? ?OSA (obstructive sleep apnea) ?Hypercarbic on admit.  Possible nonadherence to BiPAP.  Possible oversedation from medication. ?P: ?-patient refuses cpap at bedtime ?-cont o2 ? ?Atrial fibrillation ([place]) ?On apixaban at home.  Currently in sinus rhythm on oral amiodarone. ?P: ?-cont amio ?-heparin restart today; consider starting  apixiban in 24 -48 hours ?-telemetry monitoring ? ?AKI (acute kidney injury): improved ?Poor urine output initially with creatinine rising to 3.11.  Now back to baseline, but possibly volume overloaded given BNP of 1100 ?P: ?-creat/bun improved ?-Trend BMP / urinary output ?-Replace electrolytes as indicated ?-Avoid nephrotoxic agents, ensure adequate renal perfusion  ? ?Acute Delirium ?Anxiety / Depression  ?Baseline anxiety.  Now off Precedex ?P: ?-continue lyrica, seroquel, cymalta, depakote, flexeril  ?-consider weaning depakote in next few days  ?-PT/OT ? ? ?Best practice (evaluated daily)  ?Diet: Advance diet ?Pain/Anxiety/Delirium protocol (if indicated): n/a  ?VAP protocol (if indicated): n/a ?DVT prophylaxis: lovenox, transition to full dose anticoagulation 5/16 ?GI prophylaxis: none ?Glucose control: none ?Disposition: ICU ?Last date of multidisciplinary goals of care discussion: 5/12; 5/16 spoke with patient and updated at bedside ?Code Status: Full Code  ? ?Critical Care Time: 30 minutes  ? ?Mikki Harbor, PA-C ?[place] [facility] ?04/02/2022, 7:46 AM ? ?Please see [email or web address] for pager details. ? ?From 7A-7P if no response, please call [phone]. ?After hours, please call Warren Lacy [phone]. ? ? ? ? ? ?

## 2022-04-02 NOTE — Progress Notes (Signed)
Patient transferred to5w11 via wheelchair tolerated well ?

## 2022-04-02 NOTE — TOC Initial Note (Signed)
Transition of Care (TOC) - Initial/Assessment Note  ? ? ?Patient Details  ?Name: Martha Mora ?MRN: [id number] ?Date of Birth: [birth year] ? ?Transition of Care (TOC) CM/SW Contact:    ?Paulene Floor Canaan Holzer, LCSWA ?Phone Number: ?04/02/2022, 12:51 PM ? ?Clinical Narrative:                 ?CSW received consult for possible SNF placement at time of discharge. CSW spoke with patient. Patient reported that she is from home alone and that her son lives next door to her.   Patient expressed understanding of PT recommendation and is agreeable to SNF placement at time of discharge. Patient reports preference for a facility in [place]. CSW discussed insurance authorization process and will provide Medicare SNF ratings list. Patient has received 5 COVID vaccines. CSW will send out referrals for review when patient is closer to being medically ready.  No further questions reported at this time.  ? ?Skilled Nursing Rehab Facilities-   [email or web address]   Ratings out of 5 possible   ?Name Address  Phone # Quality Care Staffing Health Inspection Overall  ?[facility] [address] [place] '4 5 2 3  ' ?[facility]  [address], [place] [phone] '3 2 5 5  ' ?[facility] [place], [place] '3 1 1 1  ' ?[facility] [address], [place] '3 2 4 4  ' ?[facility] [address], [place] '1 1 2 1  ' ?[facility] [place] [phone] '2 1 4 3  ' ?[place] '5 2 3 4  ' ?[facility] [address] [place] '5 2 2 3  ' ?[address] ([facility]) [address], [place] [phone] '5 1 2 2  ' ?[facility] [address], [place] [phone] '4 1 2 1  ' ?[facility] [address] [place] [phone] '4 1 2 1  ' ?[facility] ([place]) [place], [place]  [phone] '4 1 1 1  ' ?Dustin Flock [address] [place] [phone] '3 2 4 4  ' ?        ?[place], [place]      ?[facility] [place] '4 2 3 3  ' ?[facility] [place], [place] '4 1 5 4  ' ?[place], [place] [address], [place] [phone] '2 1 1 1  ' ?[facility] [address] [place] [phone] '2 1 3 2  ' ?        ?[place] (no [facility]) [address], [place] [phone] '4 5 5 5  ' ?[facility] (No [employer]) [address], [place] '3 1 4 3  ' ?[facility] (No UHC) [address] [place] [phone] '5 5 5 5  ' ?[facility] [address], [place] [phone] '3 2 4 4  ' ?[facility] [address], [place] '1 1 2 1  ' ?[facility] [address] [place] [phone] '2 1 1 1  ' ?[facility] [place] '5 2 4 5  ' ?[facility] [address], [place] '3 1 1 1  ' ?[facility] [place], [place] '2 1 2 1  ' ?        ?[facility] [address] [place] [phone] '1 1 1 1  ' ?[facility] [address] [place]  [phone] '2 4 2 2  ' ?[facility]  [place] [address], [place] [phone] '5 2 3 4  ' ?[facility] [address], [place] '2 1 1 1  ' ?[place] (No Humana) [address], [place] '2 1 3 2  ' ?[facility] [address], [place] [phone] '3 1 1 1  ' ?        ?[facility] [place], [place] '5 4 5 5  ' ?[facility])  [address], [place] '2 2 3 3  ' ?[facility]) [place], [place] '3 2 4 4  ' ?[facility] [address], [place] '4 3 4 4  ' ?[address] [place], [place] '3 3 1 1  ' ?[facility]) [address] [place] [phone] '2 2 4 4  ' ?  ? ?Expected Discharge Plan: [place] ?Barriers to Discharge: Continued Medical Work up, SNF Pending bed offer, Insurance Authorization ? ? ?Patient Goals and CMS Choice ?Patient states their goals for this hospitalization and ongoing recovery are:: To get back home ?CMS Medicare.gov Compare Post Acute Care list provided to:: Patient ?Choice offered to / list presented to : Patient ? ?Expected Discharge Plan and Services ?Expected Discharge Plan: [place] ?  ?  ?  ?Living arrangements for the past 2 months: [place] ?                ?  ?  ?  ?  ?  ?  ?  ?  ?  ?  ? ?Prior Living Arrangements/Services ?Living arrangements for the past 2 months: [place] ?Lives with:: Self ?Patient language and need for interpreter reviewed:: Yes ?Do you feel safe going back to the place where you live?: Yes      ?Need for Family Participation in Patient Care: Yes (Comment) ?Care giver support system in place?: Yes (comment) ?  ?Criminal Activity/Legal Involvement Pertinent to Current Situation/Hospitalization: No - Comment as needed ? ?Activities of Daily Living ?  ?ADL Screening (condition at time of admission) ?Patient's cognitive ability adequate to safely complete daily activities?: No ?Is the patient deaf or have difficulty hearing?: No ?Does the patient have difficulty seeing, even when wearing glasses/contacts?: No ?Does the patient have difficulty concentrating, remembering, or making decisions?: No ?Patient able to express need for assistance with ADLs?: No ?Does the patient have difficulty dressing or bathing?: Yes ?Independently performs ADLs?: No ?Does the patient have difficulty walking or climbing stairs?: Yes ?Weakness of Legs: Both ?Weakness of Arms/Hands: Both ? ?Permission Sought/Granted ?  ?Permission granted to share information with : Yes, Verbal Permission Granted ?   ? Permission granted to  share info w AGENCY: SNF ?   ?   ? ?Emotional Assessment ?Appearance:: Appears stated age ?Attitude/Demeanor/Rapport: Engaged ?Affect (typically observed): Pleasant, Appropriate ?Orientation: : Oriented to Self, Oriented to Place, Oriented to Situation ?Alcohol / Substance Use: Not Applicable ?Psych Involvement: No (comment) ? ?Admission diagnosis:  Epistaxis [R04.0] ?Nosebleed [R04.0] ?Patient Active Problem List  ? Diagnosis Date Noted  ? AKI (acute kidney injury) ([place]) 03/26/2022  ? Respiratory failure with hypoxia and hypercapnia ([place]) 03/25/2022  ? Leukocytosis   ? Epistaxis requiring cauterization 03/24/2022  ? Chronic heart failure with preserved ejection fraction ([place]) 01/10/2022  ? SCC (squamous cell carcinoma), hand, right 05/17/2021  ? Actinic keratoses 03/29/2021  ? Dysuria 03/16/2021  ? 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  Genetic testing 02/12/2021  ? Chronic obstructive pulmonary disease ([place]) 02/05/2021  ? Lumbar radiculopathy 02/05/2021  ? Atrial fibrillation ([place]) 01/09/2021  ? Vitamin D deficiency 01/09/2021  ? Seborrheic keratoses 01/09/2021  ? Urinary urgency 11/21/2020  ? Vertigo 06/12/2020  ? Vocal cord dysfunction 06/12/2020  ? Chronic respiratory failure ([place]) 06/12/2020  ? Acute delirium   ? 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 07/16/2019  ? Renal stones 03/15/2019  ? Insomnia 09/08/2016  ? Restless leg syndrome 05/24/2015  ? History of tobacco use 05/23/2014  ? Mitral valve regurgitation 05/23/2014  ? Dysfunctional uterine bleeding 09/02/2013  ? Breast cancer of lower-outer quadrant of right female breast ([place]) 07/24/2013  ? Postmenopausal bleeding 07/14/2013  ? Osteopenia 12/27/2011  ? Anxiety 07/19/2009  ? Osteoarthritis 07/19/2009  ? IRRITABLE BOWEL SYNDROME 11/17/2008  ? COLONIC POLYPS, ADENOMATOUS 11/17/2007  ? Hyperlipidemia 11/17/2007  ? Obesity, unspecified 11/17/2007  ? Depression 11/17/2007  ? Essential hypertension 11/17/2007  ? Coronary atherosclerosis 11/17/2007  ?  HEMORRHOIDS, INTERNAL 11/17/2007  ? GERD 11/17/2007  ? OSA (obstructive sleep apnea) 11/17/2007  ? Diverticulosis of colon (without mention of hemorrhage) 09/05/2005  ? ESOPHAGEAL STRICTURE 06/01/2002  ? ?PCP:  Susa Simmonds

## 2022-04-02 NOTE — Progress Notes (Signed)
Family Medicine Teaching Service ?Daily Progress Note ?Intern Pager: [phone] ? ?Patient name: Martha Mora Medical record number: [id number] ?Date of birth: [birth year] Age: 76 y.o. Gender: female ? ?Primary Care Provider: [name], DO ?Consultants: CCM ?Code Status: Full ? ?Pt Overview and Major Events to Date:  ?5/7-admitted to [facility] with epistaxis; intubated for airway protection in ED; ENT consulted, CCM took over care ?5/8-bleeding right nare cauterized by ENT and balloon packing placed. Epistat balloon came out. Also started on Norepi overnight for soft pressures.  ?5/9-self extubated now on NRB, weaned from pressors   ?5/10-on and off levophed for soft pressures, started on precedex for agitation ?5/13-continues to have agitated delirium.  On heated high flow nasal cannula. ?5/14-No obvious bleeding at this time. Epistat deflated and removed. Remained calm and redirectable with some baseline anxiety overnight off Precedex.  Musculoskeletal pain has improved with local measures ?5/16-Heparin restarted for A. fib ? ?Assessment and Plan: ? ?Martha Mora is a 76 yr old female who presented with massive epistaxis on Eliquis. Initially she was treated with Afrin, nasal pressure and a rhino rocket. Eliquis was held.The bleeding did not stop left>right nare and pt became altered and confused. She was subsequently intubated for airway protection and admitted to ICU on 5/8. ENT were consulted who placed a Epistat baloon and cauterized the right nares. She self extubated on 5/9. Heparin restarted 5/16 for A. Fib. PMH significant for A. Fib on Eliquis, HFpEF, COPD, HTN, HLD, IBS, esophageal stricture. ? ?Epistaxis requiring cauterization ?No epistaxis since 5/7. Hemoglobin 9.6.  ?-monitor CBC ?-heated high flow ?-hold anticoagulation (patient removed PIV this morning) ? ?Acute on Chronic Hypoxic Respiratory Failure ?15 L O2 (2.5L at baseline), satting 88% when I was in room. Hx of COPD/hypercarbia make sure not to  over-oxygenate. Likely 2/2 to pulmonary edema  ?-o2 sats 88-92% ?-wean o2 ?-60 IV lasix x 1 ?-reassess in PM, potential re-dose ?-BMP tomorrow ? ?Acute Delirium, resolving  Anxiety/Depression ?Oriented on exam today but did have issues with pulling out PIV.  No bleeding at the site.  See nursing note from today at 7:35 AM.  Home medications of buspirone 15 mg 3 times daily, Depakote to 50 mg twice daily, Cymbalta 60 mg daily, mirtazapine 7.5 mg daily, Seroquel 25 mg nightly. ?-Monitor mental status ?-Delirium precautions ?-Continue home medications ? ?Atrial fibrillation on eliquis ?Currently on heparin-was restarted yesterday however patient pulled out PIV and heparin no longer running. Home med eliquis 5 mg BID, amiodarone 100 mg daily ?-continue amiodarone ?-hold eliquis  ?-continue metoprolol ?-lovenox for VTE prophylaxis ? ?AKI ?Cr 0.88 this morning.  Received Lasix yesterday. ?-Monitor renal function on BMP ? ?HTN ?BP 116-158/47-83 ?-Amlodipine 5 mg daily.  ?-Labetalol prn d/c ?-metoprolol 12.5 mg BID ?-monitor BP ? ?HLD ?Rosuvastatin 20 mg ?-continue home med ? ?HFpEF ?Lasix 40 mg IV yesterday.  Had 1-2+ lower extremity edema on examination today. BNP initially in the 4000's and now 131 after trended by PCCM.  Home med of Lasix 40 p.o. daily ?-60 IV lasix  ?-strict I's and O's ?-daily weights ? ?Hypoglycemia, resolved ?CBGs have been in 100s (was in 90s in ICU) ?-d/c CBGs ? ?Chronic pain, restless leg syndrome ?Home medications of pregabalin 75 mg twice daily ?-Continue pregabalin ?-tramadol 50 mg -consider d/c this tomorrow if pain under control ?-lidocaine patch ? ?Tobacco use ?-Nicotine patch ? ?Insomnia ?-rozerem 8 mg qhs ? ?FEN/GI: Dyspahgia 3 ?PPx: Lovenox ?Dispo: pending clinical improvement ? ?Subjective:  ?Denies any nosebleeds and  feels like breathing is better however still working on high flow requirement ? ?Objective: ?Temp:  [98 ?F (36.7 ?C)-98.6 ?F (37 ?C)] 98.4 ?F (36.9 ?C) (05/16  1955) ?Pulse Rate:  [65-94] 89 (05/16 2017) ?Resp:  [14-32] 18 (05/16 2017) ?BP: (117-189)/(48-145) 144/82 (05/16 2017) ?SpO2:  [81 %-97 %] 94 % (05/16 2017) ?FiO2 (%):  [50 %-60 %] 50 % (05/16 2017) ?Weight:  [110.8 kg] 110.8 kg (05/16 0500) ?Physical Exam: ?General: NAD, alert and oriented, responsive to all questions ?Cardiovascular: Regular rate, no murmurs rubs or gallops ?Respiratory: Clear to auscultation bilaterally, no wheezes rales or crackles in anterior lung fields, no significant dyspnea and able to speak full sentences ?Abdomen: Nontender, soft ?Extremities: 2+ pitting edema to knees bilaterally ? ?Laboratory: ?Recent Labs  ?Lab 03/31/22 ?3094 04/01/22 ?0768 04/02/22 ?0319  ?WBC 13.8* 13.8* 13.9*  ?HGB 8.0* 8.5* 8.5*  ?HCT 26.3* 29.2* 28.7*  ?PLT 279 329 351  ? ?Recent Labs  ?Lab 03/31/22 ?0881 04/01/22 ?1031 04/02/22 ?0319  ?NA 146* 147* 143  ?K 3.3* 4.0 3.7  ?CL 104 107 104  ?CO2 34* 32 33*  ?BUN '19 15 16  '$ ?CREATININE 0.91 0.86 0.84  ?CALCIUM 8.5* 8.8* 8.8*  ?GLUCOSE 134* 115* 116*  ? ? ?Imaging/Diagnostic Tests: ?No results found.  ? ?Martha Heck, MD ?04/02/2022, 10:34 PM ?PGY-1, [place] ?[place] Intern pager: [phone], text pages welcome  ?

## 2022-04-02 NOTE — Progress Notes (Signed)
Nutrition Follow-up ? ?DOCUMENTATION CODES:  ? ?Not applicable ? ?INTERVENTION:  ? ?- Ensure Enlive po BID, each supplement provides 350 kcal and 20 grams of protein. ? ?- MVI with minerals daily ? ?NUTRITION DIAGNOSIS:  ? ?Inadequate oral intake related to inability to eat as evidenced by NPO status. ? ?Progressing, pt now on dysphagia 3 diet with thin liquids ? ?GOAL:  ? ?Patient will meet greater than or equal to 90% of their needs ? ?Progressing ? ?MONITOR:  ? ?PO intake, Supplement acceptance, Weight trends ? ?REASON FOR ASSESSMENT:  ? ?Consult ?Enteral/tube feeding initiation and management ? ?ASSESSMENT:  ? ?76 year old female who presented to the ED on 5/07 with epistaxis. PMH of COPD, atrial fibrillation, anxiety, depression, HTN, HLD, CAD, GERD. Pt required intubation in the ED due to anxiety and SOB. ? ?05/12 - Cortrak placed (tip gastric, unable to place bridle), TF started ?05/13 - pt pulled Cortrak out ?05/14 - diet advanced to dysphagia 3 with thin liquids ? ?Discussed pt with RN and during ICU rounds. Pt pulled Cortrak tube out less than 24 hours after it was placed. Pt now on PO diet with meal completions improving. So far today, pt consumed 100% of breakfast and 50% of lunch. RD to order oral nutrition supplements to aid pt in meeting kcal and protein needs. Will also order daily MVI with minerals. ? ?Admit weight: 108.2 kg ?Current weight: 110.8 kg ? ?Pt with mild pitting generalized edema. ? ?Meal Completion: ?5/14: 20%, 5%, 25% ?5/15: 25%, 25%, 50% ?5/16: 100%, 50% ? ?Medications reviewed and include: remeron, protonix, heparin drip ? ?Labs reviewed: WBC 13.9, hemoglobin 8.5 ?CBG's: 100-131 x 24 hours ? ?Diet Order:   ?Diet Order   ? ?       ?  DIET DYS 3 Room service appropriate? Yes; Fluid consistency: Thin  Diet effective now       ?  ? ?  ?  ? ?  ? ? ?EDUCATION NEEDS:  ? ?No education needs have been identified at this time ? ?Skin:  Skin Assessment: Reviewed RN Assessment (MASD  abdomen) ? ?Last BM:  04/01/22 medium type 6 ? ?Height:  ? ?Ht Readings from Last 1 Encounters:  ?03/25/22 '5\' 1"'$  (1.549 m)  ? ? ?Weight:  ? ?Wt Readings from Last 1 Encounters:  ?04/02/22 110.8 kg  ? ? ?Ideal Body Weight:  47.7 kg ? ?BMI:  Body mass index is 46.15 kg/m?. ? ?Estimated Nutritional Needs:  ? ?Kcal:  1700-1900 ? ?Protein:  90-105 grams ? ?Fluid:  1.7-1.9 L ? ? ? ?Gustavus Bryant, MS, RD, LDN ?Inpatient Clinical Dietitian ?Please see AMiON for contact information. ? ?

## 2022-04-02 NOTE — Progress Notes (Signed)
Report called to 5w11 ?
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
## 2022-04-03 ENCOUNTER — Other Ambulatory Visit: Payer: Self-pay

## 2022-04-03 DIAGNOSIS — R040 Epistaxis: Secondary | ICD-10-CM | POA: Diagnosis not present

## 2022-04-03 LAB — CBC
HCT: 32.1 % — ABNORMAL LOW (ref 36.0–46.0)
Hemoglobin: 9.6 g/dL — ABNORMAL LOW (ref 12.0–15.0)
MCH: 30 pg (ref 26.0–34.0)
MCHC: 29.9 g/dL — ABNORMAL LOW (ref 30.0–36.0)
MCV: 100.3 fL — ABNORMAL HIGH (ref 80.0–100.0)
Platelets: 268 10*3/uL (ref 150–400)
RBC: 3.2 MIL/uL — ABNORMAL LOW (ref 3.87–5.11)
RDW: 14.4 % (ref 11.5–15.5)
WBC: 14.7 10*3/uL — ABNORMAL HIGH (ref 4.0–10.5)
nRBC: 1 % — ABNORMAL HIGH (ref 0.0–0.2)

## 2022-04-03 LAB — BASIC METABOLIC PANEL
Anion gap: 9 (ref 5–15)
BUN: 16 mg/dL (ref 8–23)
CO2: 30 mmol/L (ref 22–32)
Calcium: 8.9 mg/dL (ref 8.9–10.3)
Chloride: 103 mmol/L (ref 98–111)
Creatinine, Ser: 0.88 mg/dL (ref 0.44–1.00)
GFR, Estimated: >60 mL/min (ref >60)
Glucose, Bld: 121 mg/dL — ABNORMAL HIGH (ref 70–99)
Potassium: 3.5 mmol/L (ref 3.5–5.1)
Sodium: 142 mmol/L (ref 135–145)

## 2022-04-03 LAB — GLUCOSE, CAPILLARY
Glucose-Capillary: 124 mg/dL — ABNORMAL HIGH (ref 70–99)
Glucose-Capillary: 125 mg/dL — ABNORMAL HIGH (ref 70–99)
Glucose-Capillary: 126 mg/dL — ABNORMAL HIGH (ref 70–99)
Glucose-Capillary: 159 mg/dL — ABNORMAL HIGH (ref 70–99)
Glucose-Capillary: 130 mg/dL — ABNORMAL HIGH (ref 70–99)
Glucose-Capillary: 256 mg/dL — ABNORMAL HIGH (ref 70–99)

## 2022-04-03 LAB — HEPARIN LEVEL (UNFRACTIONATED): Heparin Unfractionated: 0.1 IU/mL — ABNORMAL LOW (ref 0.30–0.70)

## 2022-04-03 LAB — BRAIN NATRIURETIC PEPTIDE: B Natriuretic Peptide: 131 pg/mL — ABNORMAL HIGH (ref 0.0–100.0)

## 2022-04-03 MED ORDER — FUROSEMIDE 10 MG/ML IJ SOLN
60.0000 mg | Freq: Once | INTRAMUSCULAR | Status: AC
  Administered 2022-04-03: 60 mg via INTRAVENOUS
  Filled 2022-04-03: qty 6

## 2022-04-03 MED ORDER — ENOXAPARIN SODIUM 40 MG/0.4ML IJ SOSY
40.0000 mg | PREFILLED_SYRINGE | INTRAMUSCULAR | Status: DC
  Administered 2022-04-03 – 2022-04-05 (×3): 40 mg via SUBCUTANEOUS
  Filled 2022-04-03 (×3): qty 0.4

## 2022-04-03 NOTE — Progress Notes (Signed)
Occupational Therapy Treatment ?Patient Details ?Name: Zakyra Kukuk ?MRN: [id number] ?DOB: 06/15/1946 ?Today's Date: 04/03/2022 ? ? ?History of present illness Pt adm  5/7 with epitaxis. Pt required packing of nares. Pt became agitated and disoriented and was intubated for airway protection. Nose cauterized. Pt self extubated 5/9. Pt remained agitated with acute delirium. PMH - CAD, afib, COPD, HTN, breast cancer, anxiety, depression ?  ?OT comments ? Pt is progressing well but continues to need at least min A for LB dressing and stand pivoting to/from [facility]. Pt demonstrated poor insight to safety and deficits and verbalized that she wanted to go home despite assist levels and HHFNC requirements. Pt declined further mobility after toileting and LB ADLs; agreeable to ambulate again tomorrow. OT to continue to follow.   ? ?Recommendations for follow up therapy are one component of a multi-disciplinary discharge planning process, led by the attending physician.  Recommendations may be updated based on patient status, additional functional criteria and insurance authorization. ?   ?Follow Up Recommendations ? Skilled nursing-short term rehab (<3 hours/day)  ?  ?Assistance Recommended at Discharge Frequent or constant Supervision/Assistance  ?Patient can return home with the following ? Two people to help with walking and/or transfers;Two people to help with bathing/dressing/bathroom;Assistance with cooking/housework;Assistance with feeding;Direct supervision/assist for medications management;Direct supervision/assist for financial management;Assist for transportation;Help with stairs or ramp for entrance ?  ?Equipment Recommendations ? BSC/3in1;Other (comment);Wheelchair (measurements OT);Wheelchair cushion (measurements OT) (RW)  ?  ?Recommendations for Other Services   ? ?  ?Precautions / Restrictions Precautions ?Precautions: Fall ?Restrictions ?Weight Bearing Restrictions: No  ? ? ?  ? ?Mobility Bed  Mobility ?Overal bed mobility: Needs Assistance ?Bed Mobility: Sit to Supine ?  ?  ?  ?Sit to supine: Min guard ?  ?  ?  ? ?Transfers ?Overall transfer level: Needs assistance ?Equipment used: 1 person hand held assist ?Transfers: Bed to chair/wheelchair/BSC ?  ?Stand pivot transfers: Min assist ?  ?  ?  ?  ?General transfer comment: Assist for safety and lines. ?  ?  ?Balance Overall balance assessment: Needs assistance ?  ?Sitting balance-Leahy Scale: Fair ?  ?  ?  ?Standing balance-Leahy Scale: Poor ?  ?  ?  ?  ?  ?  ?  ?  ?  ?  ?  ?  ?   ? ?ADL either performed or assessed with clinical judgement  ? ?ADL Overall ADL's : Needs assistance/impaired ?  ?  ?Grooming: Set up;Sitting ?  ?  ?  ?  ?  ?  ?  ?Lower Body Dressing: Minimal assistance;Sitting/lateral leans ?Lower Body Dressing Details (indicate cue type and reason): able to don R sock, required assist for L ?Toilet Transfer: Minimal assistance;Stand-pivot;BSC/3in1 ?  ?  ?  ?  ?  ?Functional mobility during ADLs: Minimal assistance ?General ADL Comments: cues for PLB breathing throughout ?  ? ?Extremity/Trunk Assessment Upper Extremity Assessment ?Upper Extremity Assessment: Generalized weakness ?  ?Lower Extremity Assessment ?Lower Extremity Assessment: Defer to PT evaluation ?  ?  ?  ? ?Vision   ?Vision Assessment?: Vision impaired- to be further tested in functional context ?  ?Perception Perception ?Perception: Not tested ?  ?Praxis Praxis ?Praxis: Not tested ?  ? ?Cognition Arousal/Alertness: Awake/alert ?Behavior During Therapy: Restless, Anxious, Impulsive ?Overall Cognitive Status: Impaired/Different from baseline ?Area of Impairment: Attention, Memory, Safety/judgement, Awareness, Problem solving ?  ?  ?  ?  ?  ?  ?  ?  ?  ?  ?Memory:  Decreased short-term memory ?  ?Safety/Judgement: Decreased awareness of safety, Decreased awareness of deficits ?  ?  ?General Comments: poor safety awareness, pt states she has been getting to the [facility] by herself. ?  ?   ?   ?Exercises   ? ?  ?Shoulder Instructions   ? ? ?  ?General Comments pt on HHFNC  ? ? ?Pertinent Vitals/ Pain       Pain Assessment ?Pain Assessment: Faces ?Pain Location: Back pain with incr time walking ?Pain Descriptors / Indicators: Sore ?Pain Intervention(s): Limited activity within patient's tolerance, Monitored during session ? ?Home Living   ?  ?  ?  ?  ?  ?  ?  ?  ?  ?  ?  ?  ?  ?  ?  ?  ?  ?  ? ?  ?Prior Functioning/Environment    ?  ?  ?  ?   ? ?Frequency ? Min 2X/week  ? ? ? ? ?  ?Progress Toward Goals ? ?OT Goals(current goals can now be found in the care plan section) ? Progress towards OT goals: Progressing toward goals ? ?Acute Rehab OT Goals ?Patient Stated Goal: home tomorrow ?OT Goal Formulation: Patient unable to participate in goal setting ?Time For Goal Achievement: 04/12/22 ?Potential to Achieve Goals: Fair ?ADL Goals ?Additional ADL Goal #1: Pt will follow 1 step commands 75%+ of each session with minimal cuing. ?Additional ADL Goal #2: Pt will complete all aspects of bed mobility with mod A, as precursor to ADL's. ?Additional ADL Goal #3: Pt will demonstrate increased activity tolerance to perform seated grooming tasks with min A.  ?Plan Discharge plan remains appropriate   ? ?Co-evaluation ? ? ?   ?  ?  ?  ?  ? ?  ?AM-PAC OT "6 Clicks" Daily Activity     ?Outcome Measure ? ? Help from another person eating meals?: A Little ?Help from another person taking care of personal grooming?: A Little ?Help from another person toileting, which includes using toliet, bedpan, or urinal?: A Lot ?Help from another person bathing (including washing, rinsing, drying)?: A Lot ?Help from another person to put on and taking off regular upper body clothing?: A Little ?Help from another person to put on and taking off regular lower body clothing?: A Lot ?6 Click Score: 15 ? ?  ?End of Session Equipment Utilized During Treatment: Oxygen (BSC) ? ?OT Visit Diagnosis: Unsteadiness on feet (R26.81);Other  abnormalities of gait and mobility (R26.89);Muscle weakness (generalized) (M62.81);Other symptoms and signs involving cognitive function ?  ?Activity Tolerance Patient tolerated treatment well ?  ?Patient Left in bed;with call bell/phone within reach;with bed alarm set ?  ?Nurse Communication Mobility status ?  ? ?   ? ?Time: 4709-6283 ?OT Time Calculation (min): 19 min ? ?Charges: OT General Charges ?$OT Visit: 1 Visit ?OT Treatments ?$Self Care/Home Management : 8-22 mins ? ? ?Daxon Kyne A Allanna Bresee ?04/03/2022, 9:45 PM ?

## 2022-04-03 NOTE — Progress Notes (Signed)
Physical Therapy Treatment ?Patient Details ?Name: Martha Mora ?MRN: [id number] ?DOB: [birth year] ?Today's Date: 04/03/2022 ? ? ?History of Present Illness Pt adm  5/7 with epitaxis. Pt required packing of nares. Pt became agitated and disoriented and was intubated for airway protection. Nose cauterized. Pt self extubated 5/9. Pt remained agitated with acute delirium. PMH - CAD, afib, COPD, HTN, breast cancer, anxiety, depression ? ?  ?PT Comments  ? ? Continuing work on functional mobility and activity tolerance;  Session focused on progressive amb, and pt was able to manage walking in hallway, initially on 3 L supplemental O2, but with O2 desat, and incr to 4 L supplemental O2; Reports at home she is normally on 2.5 L supplemental O2; Pt needed up to mod assist with progressive amb, mostly for RW control and stability; she kept the RW further in front of her than is safe, and tended to gain speed without mod assist for control; Concerns remain for her safety at home alone  ?Recommendations for follow up therapy are one component of a multi-disciplinary discharge planning process, led by the attending physician.  Recommendations may be updated based on patient status, additional functional criteria and insurance authorization. ? ?Follow Up Recommendations ? Skilled nursing-short term rehab (<3 hours/day) ?  ?  ?Assistance Recommended at Discharge Frequent or constant Supervision/Assistance  ?Patient can return home with the following Help with stairs or ramp for entrance;Assist for transportation;A little help with walking and/or transfers;A little help with bathing/dressing/bathroom;Assistance with cooking/housework;Direct supervision/assist for medications management ?  ?Equipment Recommendations ? None recommended by PT  ?  ?Recommendations for Other Services   ? ? ?  ?Precautions / Restrictions Precautions ?Precautions: Fall  ?  ? ?Mobility ? Bed Mobility ?Overal bed mobility: Needs Assistance ?Bed Mobility:  Supine to Sit ?  ?  ?Supine to sit: HOB elevated, Supervision ?  ?  ?General bed mobility comments: Assist for safety ?  ? ?Transfers ?Overall transfer level: Needs assistance ?Equipment used: Rolling walker (2 wheels) ?Transfers: Sit to/from Stand ?Sit to Stand: Min guard ?  ?  ?  ?  ?  ?General transfer comment: Assist for safety and lines. ?  ? ?Ambulation/Gait ?Ambulation/Gait assistance: Min guard, Min assist ?Gait Distance (Feet): 35 Feet ?Assistive device: Rolling walker (2 wheels) ?Gait Pattern/deviations: Step-through pattern, Decreased stride length, Trunk flexed ?  ?  ?  ?General Gait Details: Assist for safety and lines. Verbal cues to keep inside RW; Trunk very flexed, most of walk, pt's face, neck and upper trunk were parallel to the floor; pt used her RW at tiems to push the IV pole;  walked on 3 L via regular [place], and O2 sats decr to 86%, incr to 4 L and sats stayed in high 80s/low 90s ? ? ?Stairs ?  ?  ?  ?  ?  ? ? ?Wheelchair Mobility ?  ? ?Modified Rankin (Stroke Patients Only) ?  ? ? ?  ?Balance   ?  ?Sitting balance-Leahy Scale: Fair ?  ?  ?  ?Standing balance-Leahy Scale: Poor ?  ?  ?  ?  ?  ?  ?  ?  ?  ?  ?  ?  ?  ? ?  ?Cognition Arousal/Alertness: Awake/alert ?Behavior During Therapy: Restless, Anxious, Impulsive ?Overall Cognitive Status: Impaired/Different from baseline ?Area of Impairment: Attention, Memory, Safety/judgement, Awareness, Problem solving ?  ?  ?  ?  ?  ?  ?  ?  ?  ?  ?Memory: Decreased short-term  memory ?  ?Safety/Judgement: Decreased awareness of safety, Decreased awareness of deficits ?  ?  ?  ?  ?  ? ?  ?Exercises   ? ?  ?General Comments General comments (skin integrity, edema, etc.): walked on 3 L via regular [place], and O2 sats decr to 86%, incr to 4 L and sats stayed in high 80s/low 90s; finished session bakc in room with pt on HHFNC ?  ?  ? ?Pertinent Vitals/Pain Pain Assessment ?Pain Assessment: Faces ?Faces Pain Scale: Hurts little more ?Pain Location: Back pain with  incr time walking ?Pain Descriptors / Indicators: Sore ?Pain Intervention(s): Monitored during session, Other (comment) (had chair follow to allow for pt to sit when back pain too much)  ? ? ?Home Living   ?  ?  ?  ?  ?  ?  ?  ?  ?  ?   ?  ?Prior Function    ?  ?  ?   ? ?PT Goals (current goals can now be found in the care plan section) Acute Rehab PT Goals ?Patient Stated Goal: Tired, but agreeeable to walk ?PT Goal Formulation: Patient unable to participate in goal setting ?Time For Goal Achievement: 04/11/22 ?Potential to Achieve Goals: Fair ?Progress towards PT goals: Progressing toward goals ? ?  ?Frequency ? ? ? Min 3X/week ? ? ? ?  ?PT Plan Current plan remains appropriate  ? ? ?Co-evaluation   ?  ?  ?  ?  ? ?  ?AM-PAC PT "6 Clicks" Mobility   ?Outcome Measure ? Help needed turning from your back to your side while in a flat bed without using bedrails?: A Little ?Help needed moving from lying on your back to sitting on the side of a flat bed without using bedrails?: A Little ?Help needed moving to and from a bed to a chair (including a wheelchair)?: A Little ?Help needed standing up from a chair using your arms (e.g., wheelchair or bedside chair)?: A Little ?Help needed to walk in hospital room?: Total ?Help needed climbing 3-5 steps with a railing? : Total ?6 Click Score: 14 ? ?  ?End of Session Equipment Utilized During Treatment: Gait belt;Oxygen ?Activity Tolerance: Patient tolerated treatment well ?Patient left: in chair;with call bell/phone within reach;with chair alarm set ?Nurse Communication: Mobility status ?PT Visit Diagnosis: Other abnormalities of gait and mobility (R26.89) ?  ? ? ?Time: 7414-2395 ?PT Time Calculation (min) (ACUTE ONLY): 23 min ? ?Charges:  $Gait Training: 8-22 mins ?$Therapeutic Activity: 8-22 mins          ?          ? ?Roney Marion, PT  ?Acute Rehabilitation Services ?Office [phone] ? ? ? ?Colletta [place] ?04/03/2022, 2:30 PM ? ?

## 2022-04-03 NOTE — Progress Notes (Signed)
Doctor on call paged per operator at this time. When NT went into rm at shift chg pt had taken off all of her cardiac leads (unclipped them off the stickers) and put the monitor on stand-by, pulled out her PICC (had Heparin drip going at 15 ml/hr) and turned off the IV pump, took off her gown and dressed herself and was sitting in her chair waiting for a cup of coffee. Pt is alert and oriented x4 and has been A&O x4 all shift. Steady gait. Pt also had her HHFNC oxygen off and was in no distress. Pt stated she had been sitting there about 30 min waiting on a cup of coffee. Attempted to start a PIV x2 on RFA without success. LUE (where PICC was wasn't bleeding and no signs that it did bleed when she pulled it out.) Pt placed back on monitor, oxygen resumed, pt placed in clean gown, and pt got back into bed. SR up and Bed Alarm is set. Difficult to set r/t not being like the beds on the unit but is set. Call light and rm phone beside pt in bed.  ?

## 2022-04-03 NOTE — Progress Notes (Signed)
Entered room to flush PICC for line care. Pt. States she removed PICC herself this morning. Confirmed with RN. Site looks bruised, however, no dressing or bleeding present. ?

## 2022-04-03 NOTE — Progress Notes (Signed)
FPTS Brief Progress Note ? ?S:Patient awake, sitting on side of bed eating. Endorses feeling much better than previously. Denied any questions or concerns.  ? ? ?O: ?BP (!) 126/46 (BP Location: Left Arm)   Pulse 83   Temp 98.7 ?F (37.1 ?C) (Oral)   Resp (!) 24   Ht '5\' 1"'$  (1.549 m)   Wt 111.7 kg   SpO2 94%   BMI 46.53 kg/m?   ?Physical exam ?General: well appearing, pleasant, NAD ?Cardiovascular: RRR, no murmurs ?Lungs: Breathing comfortably on 15L  ?Abdomen: soft, non-distended ?Skin: warm, dry. 2+ pitting edema to above calves bilaterally  ? ?A/P: ? ?Epistaxis requiring cauterization ?Hgb stable.  ?- monitor with am CBC ? ?Acute on chronic hypoxic respiratory failure  ?Currently on 15L (Baseline 2.5L) with O2 sats 96%.  ?Received 60 IV lasix this morning and evening. Will monitor output and adjust tomorrow accordingly  ?- wean O2  ?- aim keep O2 sats 88-92% ?- continue to monitor closely  ?- strict Is and Os  ? ?- Orders reviewed. Labs for AM ordered, which was adjusted as needed.  ? ?Remainder of plan per day team's note  ? ?Shary Key, DO ?04/03/2022, 9:04 PM ?PGY-2, [place] Night Resident  ?Please page [phone] with questions.  ?  ?

## 2022-04-03 NOTE — Progress Notes (Signed)
ANTICOAGULATION CONSULT NOTE - Follow Up Consult ? ?Pharmacy Consult for heparin ?Indication: atrial fibrillation ? ?Labs: ?Recent Labs  ?  04/01/22 ?0329 04/02/22 ?0319 04/02/22 ?1539 04/03/22 ?0203  ?HGB 8.5* 8.5*  --  9.6*  ?HCT 29.2* 28.7*  --  32.1*  ?PLT 329 351  --  268  ?HEPARINUNFRC  --   --  <0.10* 0.10*  ?CREATININE 0.86 0.84  --  0.88  ? ? ?Assessment: ?76yo female subtherapeutic on heparin after rate change; no infusion issues or signs of bleeding per RN. ? ?Goal of Therapy:  ?Heparin level 0.3-0.7 units/ml ?  ?Plan:  ?Will increase heparin infusion by 4 units/kgABW/hr to 1500 units/hr and check level in 8 hours.   ? ?Wynona Neat, PharmD, BCPS  ?04/03/2022,3:58 AM ? ? ?

## 2022-04-04 ENCOUNTER — Inpatient Hospital Stay (HOSPITAL_COMMUNITY): Payer: Medicare Other

## 2022-04-04 DIAGNOSIS — R040 Epistaxis: Secondary | ICD-10-CM | POA: Diagnosis not present

## 2022-04-04 DIAGNOSIS — J9692 Respiratory failure, unspecified with hypercapnia: Secondary | ICD-10-CM

## 2022-04-04 DIAGNOSIS — J9691 Respiratory failure, unspecified with hypoxia: Secondary | ICD-10-CM | POA: Diagnosis not present

## 2022-04-04 DIAGNOSIS — I5031 Acute diastolic (congestive) heart failure: Secondary | ICD-10-CM | POA: Diagnosis present

## 2022-04-04 LAB — BASIC METABOLIC PANEL
Anion gap: 10 (ref 5–15)
BUN: 13 mg/dL (ref 8–23)
CO2: 35 mmol/L — ABNORMAL HIGH (ref 22–32)
Calcium: 9.1 mg/dL (ref 8.9–10.3)
Chloride: 97 mmol/L — ABNORMAL LOW (ref 98–111)
Creatinine, Ser: 0.93 mg/dL (ref 0.44–1.00)
GFR, Estimated: >60 mL/min (ref >60)
Glucose, Bld: 112 mg/dL — ABNORMAL HIGH (ref 70–99)
Potassium: 3.4 mmol/L — ABNORMAL LOW (ref 3.5–5.1)
Sodium: 142 mmol/L (ref 135–145)

## 2022-04-04 LAB — GLUCOSE, CAPILLARY
Glucose-Capillary: 128 mg/dL — ABNORMAL HIGH (ref 70–99)
Glucose-Capillary: 127 mg/dL — ABNORMAL HIGH (ref 70–99)
Glucose-Capillary: 108 mg/dL — ABNORMAL HIGH (ref 70–99)
Glucose-Capillary: 94 mg/dL (ref 70–99)

## 2022-04-04 LAB — CBC
HCT: 33.9 % — ABNORMAL LOW (ref 36.0–46.0)
Hemoglobin: 9.8 g/dL — ABNORMAL LOW (ref 12.0–15.0)
MCH: 29.2 pg (ref 26.0–34.0)
MCHC: 28.9 g/dL — ABNORMAL LOW (ref 30.0–36.0)
MCV: 100.9 fL — ABNORMAL HIGH (ref 80.0–100.0)
Platelets: 309 10*3/uL (ref 150–400)
RBC: 3.36 MIL/uL — ABNORMAL LOW (ref 3.87–5.11)
RDW: 14.5 % (ref 11.5–15.5)
WBC: 15.2 10*3/uL — ABNORMAL HIGH (ref 4.0–10.5)
nRBC: 0.9 % — ABNORMAL HIGH (ref 0.0–0.2)

## 2022-04-04 LAB — MAGNESIUM: Magnesium: 1.8 mg/dL (ref 1.7–2.4)

## 2022-04-04 MED ORDER — FUROSEMIDE 10 MG/ML IJ SOLN
60.0000 mg | Freq: Two times a day (BID) | INTRAMUSCULAR | Status: DC
  Administered 2022-04-04 – 2022-04-05 (×2): 60 mg via INTRAVENOUS
  Filled 2022-04-04 (×2): qty 6

## 2022-04-04 MED ORDER — FUROSEMIDE 10 MG/ML IJ SOLN
60.0000 mg | Freq: Two times a day (BID) | INTRAMUSCULAR | Status: DC
  Administered 2022-04-04: 60 mg via INTRAVENOUS
  Filled 2022-04-04: qty 6

## 2022-04-04 MED ORDER — POTASSIUM CHLORIDE CRYS ER 20 MEQ PO TBCR
40.0000 meq | EXTENDED_RELEASE_TABLET | Freq: Once | ORAL | Status: AC
  Administered 2022-04-04: 40 meq via ORAL
  Filled 2022-04-04: qty 2

## 2022-04-04 NOTE — Progress Notes (Addendum)
Family Medicine Teaching Service Daily Progress Note Intern Pager: [phone]  Patient name: Martha Mora Medical record number: [id number] Date of birth: Jul 31, 1946 Age: 76 y.o. Gender: female  Primary Care Provider: [name], DO Consultants: CCM, ENT    Code Status: Full  Pt Overview and Major Events to Date:  5/7-admitted to [facility] with epistaxis; intubated for airway protection in ED; ENT consulted, CCM took over care 5/8-bleeding right nare cauterized by ENT and balloon packing placed. Epistat balloon came out. Also started on Norepi overnight for soft pressures.  5/9-self extubated now on NRB, weaned from pressors   5/10-on and off levophed for soft pressures, started on precedex for agitation 5/13-continues to have agitated delirium.  On heated high flow nasal cannula. 5/14-No obvious bleeding at this time. Epistat deflated and removed. Remained calm and redirectable with some baseline anxiety overnight off Precedex.  Musculoskeletal pain has improved with local measures 5/16-Heparin restarted for A. fib    Assessment and Plan:  Martha Mora is a 76 yr old female who presented with massive epistaxis on Eliquis. Initially she was treated with Afrin, nasal pressure and a rhino rocket. Eliquis was held.The bleeding did not stop left>right nare and pt became altered and confused. She was subsequently intubated for airway protection and admitted to ICU on 5/8. ENT were consulted who placed a Epistat baloon and cauterized the right nares. She self extubated on 5/9. Heparin restarted 5/16 for A. Fib. PMH significant for A. Fib on Eliquis, HFpEF, COPD, HTN, HLD, IBS, esophageal stricture.  Acute on chronic hypoxic respiratory failure  HFpEF Likely secondary from pulmonary edema. CXR from 5/13 showed cardiomegaly with pulmonary vascular congestion.  BNP was elevated to 1101.1 (5 days) > 131.0 (1 day) history of COPD/hypercarbia notes monitor to make sure there is no over oxygenation.   Creatinine of 0.93 from 0.88.  UOP 2.7 L.  HHFNC at 15 L. Weight today 106.4 kg from 111 kg.  Received 120 IV Lasix yesterday in total.  Coarse crackles throughout posterior lung fields and 1+ pitting edema to knees bilaterally. Will check CXR today to see if reason for continued requirement for high flow such as PNA. Patient has remained afebrile -O2 sats 88 to 92% -Wean oxygen as tolerated -60 IV Lasix BID -Consider PM redose today -Strict I's and O's -Daily weights -DuoNebs every 4 hours as needed -d/c phenergran -CXR    Epistaxis requiring cauterization Hemoglobin 9.8 from 9.6 yesterday.  Has not had any bleeding the patient says that she "picked out her scab from her nose feels like she can breathe better now." -Discussed that she should not retraumatized nose -Monitor CBC -Saline nasal gel with aloe -Saline spray  Atrial fibrillation on Eliquis Heparin was stopped yesterday and put on Lovenox for DVT prophylaxis.  Home med of Eliquis 5 mg twice daily has been held.  Home med of amiodarone is continued. -Continue amiodarone -Hold Eliquis, consider restarting in a day if stable -Continue metoprolol  Acute Delirium, resolving  Anxiety/Depression Alert and oriented x4. -BuSpar 15 mg 3 times daily -Depakote 250 twice daily -Duloxetine 60 mg daily -Hydroxyzine 10 mg every 6 hours as needed -Mirtazapine 7.5 mg nightly -Seroquel 25 mg nightly -atarax 10 mg q6h prn  AKI, resolved Creatinine 3.11 on 03/26/22 likely secondary to volume overload.  Today it is 0.93. -Monitor on BMP -Monitor on Lasix  Hypokalemia Potassium of 3.4. -40 meq potassium tablet -monitor, replete prn  HTN Blood pressure ranges have been 117-158/40 685. -amlodipine 5 mg  daily -metoprolol 12.5 mg BID -d/c hydral -monitor BP  HLD -rosuvastatin  Chronic pain  restless leg syndrome -pregabalin  -tramadol 50 mg -lidocaine patch -Acetaminophen 650 mg every 6 hours as needed -Flexeril 5 mg 3 times  daily as needed -Nasal breathing  Tobacco use -nicotine patch  Insomnia -remeron 7.5 qhs -seroquel 25 qhs  FEN/GI: Dysphagia 3 PPx: Lovenox Dispo: SNF recommended pending oxygen requirement  Subjective:  Patient wanted to go home today, discussed oxygen requirement currently and she was amenable.  Objective: Temp:  [98 F (36.7 C)-98.7 F (37.1 C)] 98.2 F (36.8 C) (05/18 0343) Pulse Rate:  [69-92] 74 (05/18 0343) Resp:  [18-24] 20 (05/18 0343) BP: (117-158)/(46-85) 151/85 (05/18 0343) SpO2:  [89 %-96 %] 93 % (05/18 0343) FiO2 (%):  [50 %] 50 % (05/17 1946) Weight:  [106.4 kg] 106.4 kg (05/18 0425) Physical Exam: General: NAD, alert and oriented x4, responsive to all questions Cardiovascular: RRR no murmurs rubs or gallops Respiratory: Coarse crackles throughout posterior lung fields, no retractions or increased work of breathing on 15 L heated high flow Abdomen: Normoactive bowel sounds, soft, nontender, nondistended Extremities: 1+ lower extremity edema to knees bilaterally  Laboratory: Recent Labs  Lab 04/02/22 0319 04/03/22 0203 04/04/22 0300  WBC 13.9* 14.7* 15.2*  HGB 8.5* 9.6* 9.8*  HCT 28.7* 32.1* 33.9*  PLT 351 268 309   Recent Labs  Lab 04/02/22 0319 04/03/22 0203 04/04/22 0300  NA 143 142 142  K 3.7 3.5 3.4*  CL 104 103 97*  CO2 33* 30 35*  BUN '16 16 13  '$ CREATININE 0.84 0.88 0.93  CALCIUM 8.8* 8.9 9.1  GLUCOSE 116* 121* 112*    Imaging/Diagnostic Tests: No results found.   Martha Heck, MD 04/04/2022, 8:21 AM PGY-1, [place] Intern pager: [phone], text pages welcome

## 2022-04-04 NOTE — Progress Notes (Signed)
RT note. RT at bedside to check [place], Patient off oxygen at this time sat 75%. Patient stated she took herself off oxygen. RT explained to patient and RN importance of oxygen and why not to take oxygen off. Patient placed back on HHFNC, 15L-50% sat 90%.  Patient being transported to XR at this time on NRB. [place] and salter set up at patients bedside for when she returns. RT will continue to monitor.

## 2022-04-04 NOTE — Care Management Important Message (Signed)
Important Message  Patient Details  Name: Martha Mora MRN: [id number] Date of Birth: Jun 28, 1946   Medicare Important Message Given:  Yes     Orbie Pyo 04/04/2022, 2:56 PM

## 2022-04-04 NOTE — Progress Notes (Incomplete)
Tried to wean pt. Is sats are low so left on HHFNC

## 2022-04-04 NOTE — Progress Notes (Signed)
FPTS Brief Progress Note  S:Patient sleeping. Did not awaken    O: BP (!) 125/49 (BP Location: Right Arm)   Pulse 78   Temp 98 F (36.7 C) (Oral)   Resp (!) 22   Ht '5\' 1"'$  (1.549 m)   Wt 106.4 kg   SpO2 (!) 87%   BMI 44.32 kg/m   Physical exam General: sleeping, NAD Resp: Normal WOB on 15L [place]   A/P:  Acute on chronic hypoxemic respiratory failure  Remains on 15L HFNC despite diuresis  Repeat CXR today with cardiomegaly with central pulmonary vascular congestion, small pleural effusions and minimal bibasilar infiltrates. - wean O2 as tolerated  - goal O2 sats 88-92% - IV lasix '60mg'$  BID  - strict Is and Os, daily weights   - Orders reviewed. Labs for AM ordered, which was adjusted as needed.   Remainder of plan per day team's note  Shary Key, DO 04/04/2022, 10:42 PM PGY-2, [employer] Family [facility] Night Resident  Please page [phone] with questions.

## 2022-04-04 NOTE — Progress Notes (Signed)
Patient is alert and oriented with forgetfulness, observed up in chair without nasal cannula in place, reapplied and patient redirected and educated, will continue to observe. No acute distress observed.

## 2022-04-04 NOTE — Progress Notes (Signed)
RE: Martha Mora. Stipp Date of Birth: [birth year] Date: 04/04/22  Please be advised that the above-named patient will require a short-term nursing home stay - anticipated 30 days or less for rehabilitation and strengthening.  The plan is for return home.

## 2022-04-05 ENCOUNTER — Ambulatory Visit: Payer: Medicare Other

## 2022-04-05 DIAGNOSIS — I4891 Unspecified atrial fibrillation: Secondary | ICD-10-CM | POA: Diagnosis not present

## 2022-04-05 DIAGNOSIS — R040 Epistaxis: Secondary | ICD-10-CM | POA: Diagnosis not present

## 2022-04-05 DIAGNOSIS — I5031 Acute diastolic (congestive) heart failure: Secondary | ICD-10-CM | POA: Diagnosis not present

## 2022-04-05 LAB — BASIC METABOLIC PANEL
Anion gap: 9 (ref 5–15)
BUN: 14 mg/dL (ref 8–23)
CO2: 40 mmol/L — ABNORMAL HIGH (ref 22–32)
Calcium: 9.2 mg/dL (ref 8.9–10.3)
Chloride: 94 mmol/L — ABNORMAL LOW (ref 98–111)
Creatinine, Ser: 1.05 mg/dL — ABNORMAL HIGH (ref 0.44–1.00)
GFR, Estimated: 54 mL/min — ABNORMAL LOW (ref >60)
Glucose, Bld: 117 mg/dL — ABNORMAL HIGH (ref 70–99)
Potassium: 3.9 mmol/L (ref 3.5–5.1)
Sodium: 143 mmol/L (ref 135–145)

## 2022-04-05 LAB — CBC
HCT: 30.3 % — ABNORMAL LOW (ref 36.0–46.0)
Hemoglobin: 9 g/dL — ABNORMAL LOW (ref 12.0–15.0)
MCH: 29.4 pg (ref 26.0–34.0)
MCHC: 29.7 g/dL — ABNORMAL LOW (ref 30.0–36.0)
MCV: 99 fL (ref 80.0–100.0)
Platelets: 278 10*3/uL (ref 150–400)
RBC: 3.06 MIL/uL — ABNORMAL LOW (ref 3.87–5.11)
RDW: 14.4 % (ref 11.5–15.5)
WBC: 15.3 10*3/uL — ABNORMAL HIGH (ref 4.0–10.5)
nRBC: 0.8 % — ABNORMAL HIGH (ref 0.0–0.2)

## 2022-04-05 LAB — VITAMIN B12: Vitamin B-12: 717 pg/mL (ref 180–914)

## 2022-04-05 MED ORDER — IPRATROPIUM-ALBUTEROL 0.5-2.5 (3) MG/3ML IN SOLN
3.0000 mL | RESPIRATORY_TRACT | Status: DC
Start: 2022-04-05 — End: 2022-04-06
  Administered 2022-04-05 (×4): 3 mL via RESPIRATORY_TRACT
  Filled 2022-04-05 (×3): qty 3

## 2022-04-05 MED ORDER — FUROSEMIDE 10 MG/ML IJ SOLN: 60.0000 mg | Freq: Every day | INTRAMUSCULAR | Status: DC

## 2022-04-05 NOTE — Progress Notes (Signed)
Physical Therapy Treatment Patient Details Name: Martha Mora MRN: [id number] DOB: [birth year] Today's Date: 04/05/2022   History of Present Illness Pt adm  5/7 with epitaxis. Pt required packing of nares. Pt became agitated and disoriented and was intubated for airway protection. Nose cauterized. Pt self extubated 5/9. Pt remained agitated with acute delirium. PMH - CAD, afib, COPD, HTN, breast cancer, anxiety, depression.    PT Comments    Pt received in chair, agreeable to therapy session and eager to progress functional mobility. Pt performed transfers with Supervision and gait with mostly min guard but occasionally needing minA for RW management/mod safety cues with pt demonstrating decreased awareness of lines and decreased insight into activity tolerance. Pt desat to 86% with exertion on 6L HF [place] but SpO2 improves with standing break and postural cues. Pt able to perform short household distance gait task and reports moderate to severe fatigue after, requesting nap prior to returning to chair for dinner. Pt continues to benefit from PT services to progress toward functional mobility goals.   Recommendations for follow up therapy are one component of a multi-disciplinary discharge planning process, led by the attending physician.  Recommendations may be updated based on patient status, additional functional criteria and insurance authorization.  Follow Up Recommendations  Skilled nursing-short term rehab (<3 hours/day)     Assistance Recommended at Discharge Frequent or constant Supervision/Assistance  Patient can return home with the following Help with stairs or ramp for entrance;Assist for transportation;A little help with walking and/or transfers;A little help with bathing/dressing/bathroom;Assistance with cooking/housework;Direct supervision/assist for medications management   Equipment Recommendations  None recommended by PT    Recommendations for Other Services        Precautions / Restrictions Precautions Precautions: Fall Restrictions Weight Bearing Restrictions: No     Mobility  Bed Mobility Overal bed mobility: Needs Assistance Bed Mobility: Sit to Supine       Sit to supine: Supervision   General bed mobility comments: cues for line awareness/safety    Transfers Overall transfer level: Needs assistance Equipment used: Rolling walker (2 wheels) Transfers: Sit to/from Stand Sit to Stand: Supervision      General transfer comment: Assist for safety and lines    Ambulation/Gait Ambulation/Gait assistance: Min guard, up to minA with mod safety cues for RW mgmt Gait Distance (Feet): 45 Feet Assistive device: Rolling walker (2 wheels) Gait Pattern/deviations: Step-through pattern, Decreased stride length, Trunk flexed       General Gait Details: Assist for safety and lines. Verbal cues to keep inside RW; Trunk very flexed, most of walk, pt's face, neck and upper trunk were often parallel to the floor; on 6L HF O2 sats decr to 86%, with standing break and cues for pursed-lip breathing, improved to >90% within 10 sec. Sensor on ear as pt with fake nails and poor signal on fingers.       Balance Overall balance assessment: Needs assistance   Sitting balance-Leahy Scale: Fair     Standing balance support: Bilateral upper extremity supported, Reliant on assistive device for balance Standing balance-Leahy Scale: Poor Standing balance comment: Fair static standing with U UE support and BUE support for gait                            Cognition Arousal/Alertness: Awake/alert Behavior During Therapy: Restless, Impulsive Overall Cognitive Status: Impaired/Different from baseline Area of Impairment: Attention, Memory, Safety/judgement, Awareness, Problem solving  Memory: Decreased short-term memory   Safety/Judgement: Decreased awareness of safety, Decreased awareness of deficits Awareness:  Emergent Problem Solving: Requires verbal cues General Comments: poor safety awareness, pt impulsive but will wait when therapist cues her. pt needs frequent reminder for safe posture and pursed-lip breathing        Exercises Other Exercises Other Exercises: encouraged her to use flutter valve hourly x10, pt defers due to RT in room for breathing tx    General Comments General comments (skin integrity, edema, etc.): see gait comments; HR WFL, pt back on 7L HF [place] post-ambulation and RT entering room to work with her      Pertinent Vitals/Pain Pain Assessment Pain Assessment: Faces Faces Pain Scale: Hurts even more Pain Location: Back pain with increased time standing Pain Descriptors / Indicators: Sore, Discomfort Pain Intervention(s): Monitored during session, Repositioned           PT Goals (current goals can now be found in the care plan section) Acute Rehab PT Goals Patient Stated Goal: To get back to walking in the woods, decrease O2 needs. PT Goal Formulation: With patient Time For Goal Achievement: 04/11/22 Progress towards PT goals: Progressing toward goals    Frequency    Min 3X/week      PT Plan Current plan remains appropriate       AM-PAC PT "6 Clicks" Mobility   Outcome Measure  Help needed turning from your back to your side while in a flat bed without using bedrails?: A Little Help needed moving from lying on your back to sitting on the side of a flat bed without using bedrails?: A Little Help needed moving to and from a bed to a chair (including a wheelchair)?: A Little Help needed standing up from a chair using your arms (e.g., wheelchair or bedside chair)?: A Little Help needed to walk in hospital room?: A Lot (mod safety cues) Help needed climbing 3-5 steps with a railing? : A Lot 6 Click Score: 16    End of Session Equipment Utilized During Treatment: Gait belt;Oxygen Activity Tolerance: Patient tolerated treatment well Patient left: in  bed;with call bell/phone within reach;with bed alarm set;Other (comment) (RT entering the room for breathing tx) Nurse Communication: Mobility status PT Visit Diagnosis: Other abnormalities of gait and mobility (R26.89)     Time: 7902-4097 PT Time Calculation (min) (ACUTE ONLY): 20 min  Charges:  $Gait Training: 8-22 mins                     Karole Oo P., PTA Acute Rehabilitation Services Secure Chat Preferred 9a-5:30pm Office: [place] 04/05/2022, 4:34 PM

## 2022-04-05 NOTE — Progress Notes (Signed)
Family Medicine Teaching Service Daily Progress Note Intern Pager: [phone]  Patient name: Martha Mora Medical record number: [id number] Date of birth: [birth year] Age: 76 y.o. Gender: female  Primary Care Provider: [name], DO Consultants: CCM, ENT signed off Code Status: Full  Pt Overview and Major Events to Date:  5/7-admitted to [facility] with epistaxis; intubated for airway protection in ED; ENT consulted, CCM took over care 5/8-bleeding right nare cauterized by ENT and balloon packing placed. Epistat balloon came out. Also started on Norepi overnight for soft pressures.  5/9-self extubated now on NRB, weaned from pressors   5/10-on and off levophed for soft pressures, started on precedex for agitation 5/13-continues to have agitated delirium.  On heated high flow nasal cannula. 5/14-No obvious bleeding at this time. Epistat deflated and removed. Remained calm and redirectable with some baseline anxiety overnight off Precedex.  Musculoskeletal pain has improved with local measures 5/16-Heparin restarted for A. fib    Assessment and Plan:  Martha Mora is a 76 yr old female who presented with massive epistaxis on Eliquis. Initially she was treated with Afrin, nasal pressure and a rhino rocket. Eliquis was held.The bleeding did not stop left>right nare and pt became altered and confused. She was subsequently intubated for airway protection and admitted to ICU on 5/8. ENT were consulted who placed a Epistat baloon and cauterized the right nares. She self extubated on 5/9. Heparin restarted 5/16 for A. Fib. PMH significant for A. Fib on Eliquis, HFpEF, COPD, HTN, HLD, IBS, esophageal stricture.   Acute on chronic hypoxic respiratory failure  HFpEF Chest x-ray from yesterday showed improvement in pulmonary effusions/infiltrates with a stable vascular congestion.Was on 15 L high flow nasal cannula which I decreased to 9 L this morning and patient was saturating well with goal of 88 to  92%.  Received another 120 IV Lasix yesterday.  Output of 1.4 L yesterday but still up 7.4 L since admit.  Weight today 106.4 kg from 111.7.  Patient still has some bibasilar crackles but overall improved and has 1+ pitting edema to knees bilaterally.  Patient says that she is trying to elevate her legs.  Creatinine today mildly elevated to 1.05 from 0.93.  WBC stable at 13.3.  Remained afebrile -Keep O2 sats 88 to 92% -Wean oxygen as tolerated -60 IV Lasix today, maybe transition to  -Strict I's and O's -Daily weights -DuoNebs every 4 hours scheduled  Macrocytic anemia  Epistaxis requiring cauterization Hemoglobin of 9 from 9.8 yesterday.  B12 was 717.  Patient has not had any known bleeding from nose.  Could be from anemia of chronic disease/blood loss. -Discussed not traumatizing nose -Monitor CBC -Saline nasal gel with aloe -Saline spray  A-fib on Eliquis CHA2DS2-VASc of 6.  Currently holding home Eliquis due to high flow requirement. -Continue amiodarone -Hold Eliquis, restart in 1-2 days pending high flow requirement -Continue metoprolol  Acute delirium, resolved  anxiety/depression Alert and oriented x4 -BuSpar 15 mg 3 times daily -Depakote 250 twice daily -Duloxetine 60 mg daily -Hydroxyzine 10 mg every 6 hours as needed -Mirtazapine 7.5 mg nightly -Seroquel 25 mg nightly  AKI, resolved Creatinine 1.05 from 0.93 we will watch closely while on Lasix -Monitor BMP  Hypokalemia Potassium 3.9 -Monitor  HTN BP ranges of 111-154/49-71 -Amlodipine 5 mg daily -Metoprolol 12.5 mg twice daily -Monitor blood pressure  Chronic/stable Chronic pain-pregabalin, tramadol, lidocaine patch, Tylenol, Flexeril Tobacco use-nicotine patch Insomnia-remeron 7.5 qhs, seroquel 25 qhs  FEN/GI: dysphagia 3 PPx: lovenox Dispo:SNF pending oxygen  requirement  Subjective:  Patient feels fine this morning and is alert and oriented.  Denies any bleeding or abdominal  pain  Objective: Temp:  [98 F (36.7 C)-98.2 F (36.8 C)] 98.2 F (36.8 C) (05/19 0802) Pulse Rate:  [65-84] 76 (05/19 0811) Resp:  [18-27] 24 (05/19 0811) BP: (111-154)/(49-71) 111/53 (05/19 0802) SpO2:  [87 %-95 %] 94 % (05/19 0811) FiO2 (%):  [50 %-60 %] 60 % (05/19 0500) Physical Exam: General: NAD, laying in bed upright comfortably Cardiovascular: RRR no murmurs rubs or gallops Respiratory: Basilar crackles, no increased work of breathing 9 L high flow Abdomen: Nontender to palpation, soft Extremities: 1+ pitting edema to knees bilaterally  Laboratory: Recent Labs  Lab 04/03/22 0203 04/04/22 0300 04/05/22 0405  WBC 14.7* 15.2* 15.3*  HGB 9.6* 9.8* 9.0*  HCT 32.1* 33.9* 30.3*  PLT 268 309 278   Recent Labs  Lab 04/03/22 0203 04/04/22 0300 04/05/22 0405  NA 142 142 143  K 3.5 3.4* 3.9  CL 103 97* 94*  CO2 30 35* 40*  BUN '16 13 14  '$ CREATININE 0.88 0.93 1.05*  CALCIUM 8.9 9.1 9.2  GLUCOSE 121* 112* 117*    Imaging/Diagnostic Tests: DG Chest 2 View  Result Date: 04/04/2022 CLINICAL DATA:  Respiratory failure. EXAM: CHEST - 2 VIEW COMPARISON:  Chest x-ray 03/24/2022. FINDINGS: The heart is mildly enlarged. There central pulmonary vascular congestion and minimal patchy opacities in the lung bases. Small pleural effusions are present. No pneumothorax or acute fracture. Cervical spinal fusion plate is present. Right axillary surgical clips are present. IMPRESSION: 1. Cardiomegaly with central pulmonary vascular congestion, small pleural effusions and minimal bibasilar infiltrates. Electronically Signed   By: Ronney Asters M.D.   On: 04/04/2022 15:19     Gerrit Heck, MD 04/05/2022, 8:44 AM PGY-1, [place] Intern pager: [phone], text pages welcome

## 2022-04-05 NOTE — Progress Notes (Signed)
RT note. Patient placed on 9L [place] sat 94% with no labored breathing noted. Patient [place] on stand by if needed, RT will continue to monitor.

## 2022-04-05 NOTE — Progress Notes (Signed)
RT note. Patient intrusted on flutter valve, patient has good effort. RT will continue to monitor.

## 2022-04-05 NOTE — TOC Progression Note (Signed)
Transition of Care [facility]) - Progression Note    Patient Details  Name: Marbeth Smedley MRN: [id number] Date of Birth: 1946/10/01  Transition of Care [facility]) CM/SW [place], LCSW Phone Number: 04/05/2022, 3:29 PM  Clinical Narrative:    CSW continuing to follow for SNF placement needs. Will complete Fl2 once oxygen less than 5L nasal cannula and submit to pasrr.    Expected Discharge Plan: [place] Barriers to Discharge: Continued Medical Work up, SNF Pending bed offer, Ship broker  Expected Discharge Plan and Services Expected Discharge Plan: [place] arrangements for the past 2 months: Single Family Home                                       Social Determinants of Health (SDOH) Interventions    Readmission Risk Interventions    04/02/2022    2:42 PM 03/25/2022    4:51 PM 01/02/2022   11:24 AM  Readmission Risk Prevention Plan  Transportation Screening Complete Complete Complete  PCP or Specialist Appt within 5-7 Days   Complete  PCP or Specialist Appt within 3-5 Days  Not Complete   Not Complete comments  Patient still urrently in ICU   Home Care Screening   Complete  Medication Review (RN CM)   Complete  HRI or Home Care Consult  Complete   Social Work Consult for [place] Planning/Counseling  Complete   Palliative Care Screening  Not Applicable   Medication Review Press photographer) Referral to Pharmacy Referral to Pharmacy   PCP or Specialist appointment within 3-5 days of discharge Complete    HRI or Home Care Consult Complete    SW Recovery Care/Counseling Consult Complete    Palliative Care Screening Not [place] Complete

## 2022-04-06 DIAGNOSIS — R040 Epistaxis: Secondary | ICD-10-CM | POA: Diagnosis not present

## 2022-04-06 DIAGNOSIS — I5031 Acute diastolic (congestive) heart failure: Secondary | ICD-10-CM | POA: Diagnosis not present

## 2022-04-06 LAB — CBC
HCT: 29.2 % — ABNORMAL LOW (ref 36.0–46.0)
Hemoglobin: 8.7 g/dL — ABNORMAL LOW (ref 12.0–15.0)
MCH: 29.4 pg (ref 26.0–34.0)
MCHC: 29.8 g/dL — ABNORMAL LOW (ref 30.0–36.0)
MCV: 98.6 fL (ref 80.0–100.0)
Platelets: 285 10*3/uL (ref 150–400)
RBC: 2.96 MIL/uL — ABNORMAL LOW (ref 3.87–5.11)
RDW: 14.3 % (ref 11.5–15.5)
WBC: 15.4 10*3/uL — ABNORMAL HIGH (ref 4.0–10.5)
nRBC: 0.6 % — ABNORMAL HIGH (ref 0.0–0.2)

## 2022-04-06 LAB — BASIC METABOLIC PANEL
Anion gap: 12 (ref 5–15)
BUN: 15 mg/dL (ref 8–23)
CO2: 37 mmol/L — ABNORMAL HIGH (ref 22–32)
Calcium: 9.3 mg/dL (ref 8.9–10.3)
Chloride: 93 mmol/L — ABNORMAL LOW (ref 98–111)
Creatinine, Ser: 0.97 mg/dL (ref 0.44–1.00)
GFR, Estimated: 60 mL/min — ABNORMAL LOW (ref >60)
Glucose, Bld: 128 mg/dL — ABNORMAL HIGH (ref 70–99)
Potassium: 3.7 mmol/L (ref 3.5–5.1)
Sodium: 142 mmol/L (ref 135–145)

## 2022-04-06 MED ORDER — ALBUTEROL SULFATE (2.5 MG/3ML) 0.083% IN NEBU: 2.5000 mg | INHALATION_SOLUTION | RESPIRATORY_TRACT | Status: DC | PRN

## 2022-04-06 MED ORDER — FUROSEMIDE 40 MG PO TABS
40.0000 mg | ORAL_TABLET | Freq: Every day | ORAL | Status: DC
  Administered 2022-04-06 – 2022-04-08 (×3): 40 mg via ORAL
  Filled 2022-04-06 (×3): qty 1

## 2022-04-06 MED ORDER — APIXABAN 5 MG PO TABS
5.0000 mg | ORAL_TABLET | Freq: Two times a day (BID) | ORAL | Status: DC
  Administered 2022-04-06 – 2022-04-08 (×5): 5 mg via ORAL
  Filled 2022-04-06 (×5): qty 1

## 2022-04-06 MED ORDER — QUETIAPINE FUMARATE 25 MG PO TABS
12.5000 mg | ORAL_TABLET | Freq: Every day | ORAL | Status: DC
  Administered 2022-04-06: 12.5 mg via ORAL
  Filled 2022-04-06: qty 1

## 2022-04-06 MED ORDER — IPRATROPIUM-ALBUTEROL 0.5-2.5 (3) MG/3ML IN SOLN
3.0000 mL | Freq: Two times a day (BID) | RESPIRATORY_TRACT | Status: DC
Start: 2022-04-06 — End: 2022-04-07
  Administered 2022-04-06: 3 mL via RESPIRATORY_TRACT
  Filled 2022-04-06 (×2): qty 3

## 2022-04-06 MED ORDER — IPRATROPIUM-ALBUTEROL 0.5-2.5 (3) MG/3ML IN SOLN
3.0000 mL | Freq: Four times a day (QID) | RESPIRATORY_TRACT | Status: DC
  Administered 2022-04-06: 3 mL via RESPIRATORY_TRACT
  Filled 2022-04-06: qty 3

## 2022-04-06 NOTE — Progress Notes (Signed)
Attempted to place patient on CPAP for the night. After a short period of time patient stated she was unable to tolerate CPAP. Placed patient back on 5lpm cannula for the night.

## 2022-04-06 NOTE — Progress Notes (Signed)
FPTS Interim Night Progress Note  S:Patient sleeping comfortably.  Rounded with primary night RN.  No concerns voiced.  No orders required.    O: Today's Vitals   04/06/22 1228 04/06/22 1636 04/06/22 1930 04/06/22 1943  BP:  108/63  (!) 91/41  Pulse:  69  74  Resp:  17  20  Temp:  98.2 F (36.8 C)  97.8 F (36.6 C)  TempSrc:  Oral  Oral  SpO2:  94% 100% 92%  Weight: 106 kg     Height:      PainSc:          A/P: Continue current management  Carollee Leitz MD PGY-3, [place] Medicine Service pager [phone]

## 2022-04-06 NOTE — Progress Notes (Signed)
FPTS Interim Progress Note  S:Sleeping  O: BP (!) 136/49   Pulse 91   Temp 97.9 F (36.6 C) (Oral)   Resp (!) 25   Ht '5\' 1"'$  (1.549 m)   Wt 106.4 kg   SpO2 96%   BMI 44.32 kg/m     A/P: Acute on chronic hypoxic respiratory failure, HFpEF Tolerating oxygen wean very well- on 5L [place]. Unable to tolerate CPAP this evening - O2 goal 88-92% - F/u UOP, S/p IV '60mg'$  Lasix 5/19  Other plans per day team. AM labs ordered.  Orvis Brill, DO 04/06/2022, 12:01 AM PGY-1, [place] Medicine Service pager [phone]

## 2022-04-06 NOTE — Progress Notes (Signed)
Family Medicine Teaching Service Daily Progress Note Intern Pager: [phone]  Patient name: Martha Mora Medical record number: [id number] Date of birth: 1945/12/28 Age: 76 y.o. Gender: female  Primary Care Provider: [name], DO Consultants: CCM  Code Status: FULL   Pt Overview and Major Events to Date:  5/7-admitted to [facility] with epistaxis; intubated for airway protection in ED; ENT consulted, CCM took over care 5/8-bleeding right nare cauterized by ENT and balloon packing placed. Epistat balloon came out. Also started on Norepi overnight for soft pressures.  5/9-self extubated now on NRB, weaned from pressors   5/10-on and off levophed for soft pressures, started on precedex for agitation 5/13-continues to have agitated delirium.  On heated high flow nasal cannula. 5/14-No obvious bleeding at this time. Epistat deflated and removed. Remained calm and redirectable with some baseline anxiety overnight off Precedex.  Musculoskeletal pain has improved with local measures 5/16-Heparin restarted for A. fib  Assessment and Plan:  Acute on chronic hypoxic respiratory failure  HFpEF, treating  Patient reports improvement of dyspnea. On going crackles throughout lung fields, elevated JVD and 1+ pitting peripheral edema, sats 97% at beside today on high flow nasal cannula 4 L with some desats on minimal exertion.  Overall oxygen requirement is improving. Weight from today is pending. Received lasix '60mg'$  IV yesterday with excellent output 1400cc -Switch to [place] oxygen, wean as able to home oxygen which is 2.5L  -Daily weights  -Strict I's and O's -Switch to PO lasix '40mg'$  (home dose), redose accordingly to UOP  -Monitor Cr closely   Atrial fibrillation, treating HR 68. CHA2DS2-VASc score 6, we have been holding Eliquis due to severe epistaxis and low hemoglobin.  -Restart Eliquis today '5mg'$  BID  -Continue amiodarone 400 mg daily  Epistaxis, resolved Denies epistaxis overnight.  Tolerated lovenox yesterday which is reassuring. Explained to pt that she should avoid picking her nose as this will increase risk of epistaxis -Monitor for further sx of epistaxis   Anemia of chronic disease  Hb 8.7, MCV 98. Was >100 a few days ago. B12 717 wnl. Likely anemia of chronic disease -Continue to monitor with CBC   AKI, resolved Cr 0.97, improved from creatinine of 1 yesterday.  Baseline is 0.8-0.9 -Monitor with BMP  Hypertension, stable BPs overnight 113/51  -Continue metoprolol 12.5 g twice daily  Chronic pain Continue tramadol 50 mg every 6 as needed, Tylenol 650 mg every 6 as needed, Flexeril 5 mg 3 times daily as needed, lidocaine patch, muscle rub  FEN/GI: Dysphagia 3 PPx: Eliquis  Dispo:SNF /home pending clinical improvement . Barriers include clinical improvement.   Subjective:  Pt is doing well, denies dyspnea or chest pain. Reports "peeing like a race horse" after taking her diuretics.  Objective: Temp:  [97.7 F (36.5 C)-98.2 F (36.8 C)] 98 F (36.7 C) (05/20 0336) Pulse Rate:  [63-91] 66 (05/20 0336) Resp:  [15-25] 20 (05/20 0336) BP: (90-136)/(48-75) 103/75 (05/20 0336) SpO2:  [71 %-100 %] 90 % (05/20 0336)  Physical Exam: General: Alert, no acute distress Cardio: Normal S1 and S2, RRR, no r/m/g Pulm: CTAB, normal work of breathing Abdomen: Bowel sounds normal. Abdomen soft and non-tender.  Extremities: No peripheral edema.  Neuro: Cranial nerves grossly intact   Laboratory: Recent Labs  Lab 04/04/22 0300 04/05/22 0405 04/06/22 0122  WBC 15.2* 15.3* 15.4*  HGB 9.8* 9.0* 8.7*  HCT 33.9* 30.3* 29.2*  PLT 309 278 285   Recent Labs  Lab 04/04/22 0300 04/05/22 0405 04/06/22 0122  NA 142 143 142  K 3.4* 3.9 3.7  CL 97* 94* 93*  CO2 35* 40* 37*  BUN '13 14 15  '$ CREATININE 0.93 1.05* 0.97  CALCIUM 9.1 9.2 9.3  GLUCOSE 112* 117* 128*      Imaging/Diagnostic Tests: DG Chest 2 View  Result Date: 04/04/2022 CLINICAL DATA:   Respiratory failure. EXAM: CHEST - 2 VIEW COMPARISON:  Chest x-ray 03/24/2022. FINDINGS: The heart is mildly enlarged. There central pulmonary vascular congestion and minimal patchy opacities in the lung bases. Small pleural effusions are present. No pneumothorax or acute fracture. Cervical spinal fusion plate is present. Right axillary surgical clips are present. IMPRESSION: 1. Cardiomegaly with central pulmonary vascular congestion, small pleural effusions and minimal bibasilar infiltrates. Electronically Signed   By: Ronney Asters M.D.   On: 04/04/2022 15:19     Lattie Haw, MD 04/06/2022, 7:43 AM PGY-3, [place] Intern pager: [phone], text pages welcome

## 2022-04-06 NOTE — Progress Notes (Signed)
Pt stated she is unable to tolerate the CPAP.

## 2022-04-07 DIAGNOSIS — R040 Epistaxis: Secondary | ICD-10-CM | POA: Diagnosis not present

## 2022-04-07 DIAGNOSIS — I5031 Acute diastolic (congestive) heart failure: Secondary | ICD-10-CM | POA: Diagnosis not present

## 2022-04-07 LAB — CBC
HCT: 31 % — ABNORMAL LOW (ref 36.0–46.0)
Hemoglobin: 8.9 g/dL — ABNORMAL LOW (ref 12.0–15.0)
MCH: 28.7 pg (ref 26.0–34.0)
MCHC: 28.7 g/dL — ABNORMAL LOW (ref 30.0–36.0)
MCV: 100 fL (ref 80.0–100.0)
Platelets: 268 10*3/uL (ref 150–400)
RBC: 3.1 MIL/uL — ABNORMAL LOW (ref 3.87–5.11)
RDW: 14.3 % (ref 11.5–15.5)
WBC: 15.3 10*3/uL — ABNORMAL HIGH (ref 4.0–10.5)
nRBC: 0.6 % — ABNORMAL HIGH (ref 0.0–0.2)

## 2022-04-07 LAB — BASIC METABOLIC PANEL
Anion gap: 10 (ref 5–15)
BUN: 14 mg/dL (ref 8–23)
CO2: 39 mmol/L — ABNORMAL HIGH (ref 22–32)
Calcium: 9.3 mg/dL (ref 8.9–10.3)
Chloride: 94 mmol/L — ABNORMAL LOW (ref 98–111)
Creatinine, Ser: 1.08 mg/dL — ABNORMAL HIGH (ref 0.44–1.00)
GFR, Estimated: 53 mL/min — ABNORMAL LOW (ref >60)
Glucose, Bld: 153 mg/dL — ABNORMAL HIGH (ref 70–99)
Potassium: 3.7 mmol/L (ref 3.5–5.1)
Sodium: 143 mmol/L (ref 135–145)

## 2022-04-07 NOTE — Progress Notes (Signed)
FPTS Brief Progress Note  S:Patient awake when I entered the room sitting on the side of her bed. She reports feeling well. She asks if the plan is still to go home tomorrow and I told her that likely will be the case pending no changes in status. She states there has been no further nose bleeding and has been avoiding touching her nose.    O: BP (!) 145/53 (BP Location: Right Arm)   Pulse 73   Temp 98.1 F (36.7 C) (Oral)   Resp 20   Ht '5\' 1"'$  (1.549 m)   Wt 107.2 kg   SpO2 90%   BMI 44.65 kg/m   General: alert, sitting on the side of the bed, NAD CV: RRR Resp: breathing comfortably on 2.5L [place]  GI: soft, non distended  A/P:  Continue current management  - Orders reviewed. Labs for AM ordered, which was adjusted as needed.    Shary Key, DO 04/07/2022, 11:50 PM PGY-2, [place] Family Medicine Night Resident  Please page [phone] with questions.

## 2022-04-07 NOTE — Progress Notes (Signed)
Patient unable to wear CPAP at night due to claustrophobia.

## 2022-04-07 NOTE — Progress Notes (Signed)
Family Medicine Teaching Service Daily Progress Note Intern Pager: [phone]  Patient name: Martha Mora Medical record number: [id number] Date of birth: 1946-10-27 Age: 76 y.o. Gender: female  Primary Care Provider: [name], DO Consultants: CCM Code Status: Full  Pt Overview and Major Events to Date:  5/7-admitted to [facility] with epistaxis; intubated for airway protection in ED; ENT consulted, CCM took over care 5/8-bleeding right nare cauterized by ENT and balloon packing placed. Epistat balloon came out. Also started on Norepi overnight for soft pressures.  5/9-self extubated now on NRB, weaned from pressors   5/10-on and off levophed for soft pressures, started on precedex for agitation 5/13-continues to have agitated delirium.  On heated high flow nasal cannula. 5/14-No obvious bleeding at this time. Epistat deflated and removed. Remained calm and redirectable with some baseline anxiety overnight off Precedex.  Musculoskeletal pain has improved with local measures 5/16-Heparin restarted for A. Fib 5/20-Eliquis restarted for A fib  Assessment and Plan:  Martha Mora is a 76 yr old female who presented with massive epistaxis on Eliquis. Initially she was treated with Afrin, nasal pressure and a rhino rocket. Eliquis was held.The bleeding did not stop left>right nare and pt became altered and confused. She was subsequently intubated for airway protection and admitted to ICU on 5/8. ENT were consulted who placed a Epistat baloon and cauterized the right nares. She self extubated on 5/9. Heparin restarted 5/16 for A. Fib. PMH significant for A. Fib on Eliquis, HFpEF, COPD, HTN, HLD, IBS, esophageal stricture.   Acute on chronic hypoxic respiratory failure  HFpEF, treating  This morning patient reports SOB is improved, she is wanting to go home.  Was on 5-7 L high flow nasal cannula overnight.  This morning weaned her to home 2.5 liters.  Creatinine is 1.08 today.  UOP 700 mL, weight  slightly increased to 107.2 from 106 kg.  -Monitor O2 sats-88-92% -Daily weights -Strict I's and O's -home P.O. Lasix 40 mg -Monitor creatinine -duonebs d/c'd  Atrial fibrillation on Eliquis Heart rates have been 60-80s.  Restarted Eliquis yesterday due to stability -Home Eliquis 5 mg twice daily -Amiodarone 100 mg daily  Epistaxis, resolved Denies any epistaxis overnight and has been stable with hemoglobin of 8.9 today.   -Monitor for further episodes -Keep nose moisturized with nasal saline gel/spray  Anemia of chronic disease Hemoglobin of 8.9 with MCV of 100.  Likely secondary to anemia of chronic disease or acute blood loss -Monitor CBC  AKI, resolved Creatinine of 1.08 from 0.97 baseline around 0.8-0.9. -Monitor with BMP  HTN, stable Blood pressure of 91-149/29-63. -Metoprolol 12.5 twice daily  Acute delirium, resolved over anxiety/depression Patient is alert and oriented this morning and weaning Seroquel -Avoid sedating agents -Seroquel 12.5 mg nightly -BuSpar 15 mg 3 times daily -Depakote to 50 mg twice daily -Duloxetine 60 mg daily -Hydroxyzine 10 mg every 6 hours as needed -Mirtazapine 7.5 mg nightly  Chronic pain -Tramadol -Pregabalin -Lidocaine patch -Tylenol -Flexeril  Chronic/stable Tobacco use-nicotine patch Insomnia-Remeron 7.5 nightly, Seroquel 12.5 nightly  FEN/GI: dys 3 PPx: eliquis Dispo:Home with HHPT likely tomorrow if stable o2/hemoglobin  Subjective:  No issues overnight   Objective: Temp:  [97.8 F (36.6 C)-98.7 F (37.1 C)] 98.2 F (36.8 C) (05/21 0325) Pulse Rate:  [61-88] 83 (05/21 0325) Resp:  [17-20] 20 (05/21 0325) BP: (91-149)/(29-63) 149/52 (05/21 0325) SpO2:  [90 %-100 %] 92 % (05/21 0325) Weight:  [106 kg-107.2 kg] 107.2 kg (05/21 0131) Physical Exam: General: NAD, laying in  bed comfortably Cardiovascular: RRR no m/r/g Respiratory: coarse crackles, no w/r, saturating well on 2.5L Abdomen: Nontender to  palpation Extremities: 1+ pitting edema   Laboratory: Recent Labs  Lab 04/05/22 0405 04/06/22 0122 04/07/22 0057  WBC 15.3* 15.4* 15.3*  HGB 9.0* 8.7* 8.9*  HCT 30.3* 29.2* 31.0*  PLT 278 285 268   Recent Labs  Lab 04/05/22 0405 04/06/22 0122 04/07/22 0057  NA 143 142 143  K 3.9 3.7 3.7  CL 94* 93* 94*  CO2 40* 37* 39*  BUN '14 15 14  '$ CREATININE 1.05* 0.97 1.08*  CALCIUM 9.2 9.3 9.3  GLUCOSE 117* 128* 153*    Imaging/Diagnostic Tests: No results found.   Gerrit Heck, MD 04/07/2022, 8:01 AM PGY-1, [place] Intern pager: [phone], text pages welcome

## 2022-04-08 ENCOUNTER — Inpatient Hospital Stay: Admission: RE | Admit: 2022-04-08 | Payer: Medicare Other | Source: Ambulatory Visit

## 2022-04-08 ENCOUNTER — Other Ambulatory Visit (HOSPITAL_COMMUNITY): Payer: Self-pay

## 2022-04-08 DIAGNOSIS — I4891 Unspecified atrial fibrillation: Secondary | ICD-10-CM | POA: Diagnosis not present

## 2022-04-08 DIAGNOSIS — R040 Epistaxis: Secondary | ICD-10-CM | POA: Diagnosis not present

## 2022-04-08 DIAGNOSIS — I5031 Acute diastolic (congestive) heart failure: Secondary | ICD-10-CM | POA: Diagnosis not present

## 2022-04-08 LAB — CBC
HCT: 31.2 % — ABNORMAL LOW (ref 36.0–46.0)
Hemoglobin: 9.2 g/dL — ABNORMAL LOW (ref 12.0–15.0)
MCH: 29.3 pg (ref 26.0–34.0)
MCHC: 29.5 g/dL — ABNORMAL LOW (ref 30.0–36.0)
MCV: 99.4 fL (ref 80.0–100.0)
Platelets: 292 10*3/uL (ref 150–400)
RBC: 3.14 MIL/uL — ABNORMAL LOW (ref 3.87–5.11)
RDW: 14.2 % (ref 11.5–15.5)
WBC: 13.6 10*3/uL — ABNORMAL HIGH (ref 4.0–10.5)
nRBC: 0.4 % — ABNORMAL HIGH (ref 0.0–0.2)

## 2022-04-08 LAB — BASIC METABOLIC PANEL
Anion gap: 11 (ref 5–15)
BUN: 13 mg/dL (ref 8–23)
CO2: 38 mmol/L — ABNORMAL HIGH (ref 22–32)
Calcium: 9.1 mg/dL (ref 8.9–10.3)
Chloride: 93 mmol/L — ABNORMAL LOW (ref 98–111)
Creatinine, Ser: 1.23 mg/dL — ABNORMAL HIGH (ref 0.44–1.00)
GFR, Estimated: 45 mL/min — ABNORMAL LOW (ref >60)
Glucose, Bld: 206 mg/dL — ABNORMAL HIGH (ref 70–99)
Potassium: 3.8 mmol/L (ref 3.5–5.1)
Sodium: 142 mmol/L (ref 135–145)

## 2022-04-08 MED ORDER — ACETAMINOPHEN 325 MG PO TABS: 650.0000 mg | ORAL_TABLET | Freq: Four times a day (QID) | ORAL | Status: DC | PRN

## 2022-04-08 MED ORDER — PREGABALIN 25 MG PO CAPS
25.0000 mg | ORAL_CAPSULE | Freq: Two times a day (BID) | ORAL | 0 refills | Status: DC
  Filled 2022-04-08: qty 30, 15d supply, fill #0

## 2022-04-08 MED ORDER — SALINE SPRAY 0.65 % NA SOLN: 2.0000 | NASAL | 0 refills | Status: DC | PRN

## 2022-04-08 MED ORDER — AYR SALINE NASAL NA GEL: 1.0000 "application " | NASAL | 0 refills | Status: DC | PRN

## 2022-04-08 MED ORDER — MIRTAZAPINE 7.5 MG PO TABS
7.5000 mg | ORAL_TABLET | Freq: Every day | ORAL | 0 refills | Status: DC
Start: 2022-04-08 — End: 2022-04-10
  Filled 2022-04-08: qty 30, 30d supply, fill #0

## 2022-04-08 NOTE — Discharge Summary (Signed)
[facility] Discharge Summary  Patient name: Martha Mora General Medical record number: [id number] Date of birth: 07-11-1946 Age: 76 y.o. Gender: female Date of Admission: 03/24/2022  Date of Discharge: 04/08/2022 Admitting Physician: Spero Geralds, MD  Primary Care Provider: [name], DO Consultants: CCM, ENT  Indication for Hospitalization: Epistaxis  Discharge Diagnoses/Problem List:  Active Problems:   Anxiety   Depression   OSA (obstructive sleep apnea)   Insomnia   Atrial fibrillation ([place])   Epistaxis requiring cauterization   Respiratory failure with hypoxia and hypercapnia (HCC)   AKI (acute kidney injury) ([place])   Acute diastolic heart failure ([place])   Disposition: Home with [facility]  Discharge Condition: Stable  Discharge Exam:   General: NAD, laying in bed comfortably, alert and oriented x 4 Cardiovascular: RRR no m/r/g Respiratory: coarse crackles, no w/r, saturating well on 2.5L Abdomen: Nontender to palpation, soft Extremities: 1+ pitting edema to knees bilaterally   Brief Hospital Course:  Timeline of events 5/7 admitted to [facility] with epistaxis; intubated for airway protection; ENT consulted  5/8 bleeding right nare cauterized by and balloon packing placed. Epistat balloon came out. Also started on NE overnight for soft pressures.  5/9 self extubated now on NRB, weaned from pressors   5/10 on and off levophed for soft pressures, started on precedex for agitation 5/13 continues to have agitated delirium.  On heated high flow nasal cannula. 5/14 No obvious bleeding at this time.  Epistat deflated and removed. Remained calm and redirectable with some baseline anxiety overnight off Precedex.  Musculoskeletal pain has improved with local measures 5/16 Heparin restarted for A. Fib 5/20 Eliquis restarted for A Fib   Epistaxis Martha Mora is a 76 yr old female who presented with massive epistaxis on Eliquis. Initially she was treated with  Afrin, nasal pressure and a rhino rocket. Eliquis was held.The bleeding did not stop left>right nare and pt became altered and confused. She was subsequently intubated for airway protection and admitted to ICU on 5/8. ENT were consulted who placed a Epistat baloon and cauterized the right nares. She self extubated on 5/9. Heparin restarted 5/16 for A. Fib which was stopped after IV pulled 5/17. Was continued on DVT prophylaxis and transitioned to eliquis on 5/20. Did not have recurrence of epistaxis and hemoglobin remained stable at discharge.   Respiratory failure with hypoxia  Previously intubated for agitation and hypoventilation in setting of massive epistaxis.  Was transitioned to heated high flow since self extubation.  Suspect combination of aspiration during initial episode of epistaxis plus component of volume overload. Was weaned from [facility] and discharged on 2.5 L O2 humidified. CXR was obtained on 5/18 d/t continue high flow which showed interval improvement of pulmonary edema seen on previous CXR in ICU. Patient was transitioned from IV diuresis to PO home diuretic.  Atrial fibrillation Remained stable on amiodarone 100 mg daily and metoprolol. Eliquis was stopped on admission and restarted on 5/20. Rates remained stable.  Acute deliriumAnxiety Home medications of buspirone 15 mg 3 times daily, Depakote to 50 mg twice daily, Cymbalta 60 mg daily, mirtazapine 7.5 mg daily. Seroquel 25 mg (d/c'd was not home med).  AKI, resolved Poor urine output initially with creatinine rising to 3.11. Returned to baseline. Cr at end of discharge was 1.23  HFpEF  BNP initally 1,101.1>131 at end of hospitalizaiton. Was diuresed 3.2 L. End weight was 103.8 kg   Issues for Follow Up:  Monitor Hgb as pt was restarted on Eliquis  and had history of massive epistaxis Recommend outpatient pulmonary medicine evaluation for suspected COPD/chronic lung disease (also question if component of pulmonary HTN)   Losartan held at discharge, restart as appropriate Avoid sedating medications as possible given patient had acute delirium in hospital Cardiology follow up recommended-Hx of MI 1997-restart aspirin outpatient per recs (held on discharge d/t hx of epistaxis) Reschedule low dose CT for nodule follow up   Significant Procedures:   Intubation in ED  Significant Labs and Imaging:  Recent Labs  Lab 04/06/22 0122 04/07/22 0057 04/08/22 0137  WBC 15.4* 15.3* 13.6*  HGB 8.7* 8.9* 9.2*  HCT 29.2* 31.0* 31.2*  PLT 285 268 292   Recent Labs  Lab 04/04/22 0300 04/05/22 0405 04/06/22 0122 04/07/22 0057 04/08/22 0137  NA 142 143 142 143 142  K 3.4* 3.9 3.7 3.7 3.8  CL 97* 94* 93* 94* 93*  CO2 35* 40* 37* 39* 38*  GLUCOSE 112* 117* 128* 153* 206*  BUN '13 14 15 14 13  '$ CREATININE 0.93 1.05* 0.97 1.08* 1.23*  CALCIUM 9.1 9.2 9.3 9.3 9.1  MG 1.8  --   --   --   --     Results/Tests Pending at Time of Discharge:   Discharge Medications:  Allergies as of 04/08/2022       Reactions   Keflex [cephalexin] Other (See Comments)   "Made me feel funny" Tolerated Rocephin in April 2020 over several days and 07/01/19 in ED.   Penicillins Rash, Other (See Comments)   03/01/19 tolerated Zosyn Red bumps all over stomach Did it involve swelling of the face/tongue/throat, SOB, or low BP? No Did it involve sudden or severe rash/hives, skin peeling, or any reaction on the inside of your mouth or nose? Yes Did you need to seek medical attention at a hospital or doctor's office? Yes When did it last happen?      30+ years  If all above answers are "NO", may proceed with cephalosporin use. 03/01/19 tolerated Zosyn Red bumps all over stomach Did it involve swelling of the face/tongue/throat, SOB, or low BP? No Did it involve sudden or severe rash/hives, skin peeling, or any reaction on the inside of your mouth or nose? Yes Did you need to seek medical attention at a hospital or doctor's office?  Yes When did it last happen?      30+ years  If all above answers are "NO", may proceed with cephalosporin use.   Ropinirole Hcl Other (See Comments)   "Could not stop moving" likely akathisia occurred in 2019   Trazodone And Nefazodone Other (See Comments)   Makes restless leg worse   Shellfish Allergy Nausea And Vomiting        Medication List     STOP taking these medications    Aspirin Low Dose 81 MG tablet Generic drug: aspirin EC   cefdinir 300 MG capsule Commonly known as: OMNICEF   losartan 100 MG tablet Commonly known as: COZAAR   ramelteon 8 MG tablet Commonly known as: ROZEREM   Xarelto 20 MG Tabs tablet Generic drug: rivaroxaban       TAKE these medications    acetaminophen 325 MG tablet Commonly known as: TYLENOL Take 2 tablets (650 mg total) by mouth every 6 (six) hours as needed for mild pain, fever or headache. What changed:  medication strength how much to take when to take this reasons to take this   albuterol 108 (90 Base) MCG/ACT inhaler Commonly known as: VENTOLIN HFA TAKE  1-2 INHALATIONS EVERY 4-6 HOURS AS NEEDED FOR WHEEZING. DISPENSE SPACER AS NEEDED. What changed: See the new instructions.   amiodarone 200 MG tablet Commonly known as: PACERONE Take 1/2 tablet (100 mg total) by mouth daily. Schedule an appointment with your cardiologist prior to next refill.   amLODipine 5 MG tablet Commonly known as: NORVASC Take 5 mg by mouth daily.   apixaban 5 MG Tabs tablet Commonly known as: ELIQUIS Take 5 mg by mouth 2 (two) times daily.   busPIRone 15 MG tablet Commonly known as: BUSPAR Take 1 tablet (15 mg total) by mouth 3 (three) times daily.   Calcium Carb-Cholecalciferol 600-800 MG-UNIT Tabs Take 1 tablet by mouth 2 (two) times a day.   DULoxetine 60 MG capsule Commonly known as: CYMBALTA TAKE 1 CAPSULE BY MOUTH EVERY DAY What changed:  how much to take when to take this   furosemide 40 MG tablet Commonly known as:  LASIX Take 40 mg by mouth daily. What changed: Another medication with the same name was removed. Continue taking this medication, and follow the directions you see here.   ketorolac 0.5 % ophthalmic solution Commonly known as: ACULAR Place 1 drop into both eyes See admin instructions. Four times a day for 2 days following eye injection. Injection every 5 weeks   loratadine 10 MG tablet Commonly known as: CLARITIN Take 1 tablet (10 mg total) by mouth daily.   metoprolol tartrate 25 MG tablet Commonly known as: LOPRESSOR TAKE 0.5 TABLETS BY MOUTH 2 TIMES DAILY. What changed: See the new instructions.   mirtazapine 7.5 MG tablet Commonly known as: REMERON Take 1 tablet (7.5 mg total) by mouth at bedtime.   multivitamin tablet Take 1 tablet by mouth daily. Herbal Life multivitamin   omeprazole 40 MG capsule Commonly known as: PRILOSEC TAKE 1 CAPSULE BY MOUTH  DAILY   pregabalin 25 MG capsule Commonly known as: LYRICA Take 1 capsule (25 mg total) by mouth 2 (two) times daily. What changed:  medication strength how much to take   PRESERVISION AREDS 2 PO Take 1 tablet by mouth daily.   Prolia 60 MG/ML Sosy injection Generic drug: denosumab Inject 60 mg into the skin every 6 (six) months.   rosuvastatin 20 MG tablet Commonly known as: CRESTOR Take 20 mg by mouth daily.   saline Gel Place 1 application. into both nostrils every 4 (four) hours as needed (Dryness, irritation).   sodium chloride 0.65 % Soln nasal spray Commonly known as: OCEAN Place 2 sprays into both nostrils as needed for congestion.   Vitamin D (Ergocalciferol) 1.25 MG (50000 UNIT) Caps capsule Commonly known as: DRISDOL TAKE 1 CAPSULE BY MOUTH ONE TIME PER WEEK What changed: See the new instructions.        Discharge Instructions: Please refer to Patient Instructions section of EMR for full details.  Patient was counseled important signs and symptoms that should prompt return to medical care,  changes in medications, dietary instructions, activity restrictions, and follow up appointments.   Follow-Up Appointments:  Follow-up Information     [place] .   Specialty: Emergency Medicine Contact information: [address] [id number] [place]        Melissa Montane, MD .   Specialty: Otolaryngology Contact information: [address] [place] [address] [place] [address] [phone]         [name], DO. Go on 04/09/2022.   Specialty: Family Medicine Why: 9:50 AM Contact information:  [place] [address] [phone]         [name], DO. Go on 04/26/2022.   Specialty: Family Medicine Why: At 9:50 am. Please arrive by 9:35 am. This is your two week follow up with your PCP to check in on any bleeding after two weeks of Eliquis and aspirin. Contact information: [address] [address] [phone]                 Gerrit Heck, MD 04/08/2022, 12:06 PM PGY-1, [place]

## 2022-04-08 NOTE — Progress Notes (Signed)
Physical Therapy Treatment Patient Details Name: Martha Mora MRN: [id number] DOB: 1945/12/02 Today's Date: 04/08/2022   History of Present Illness Pt adm  5/7 with epitaxis. Pt required packing of nares. Pt became agitated and disoriented and was intubated for airway protection. Nose cauterized. Pt self extubated 5/9. Pt remained agitated with acute delirium. PMH - CAD, afib, COPD, HTN, breast cancer, anxiety, depression.    PT Comments    Noted patient's discharge plan was updated to home with HHPT by MD. On arrival, pt dressed and did not want to expend her energy with PT as she is going home today. Lengthy discussion re: her home set-up with pt then explaining she is actually going to her daughter's home. Discussed equipment that needs to be moved to her daughter's home and pt in agreement. Discussed entry into her daughter's home which requires her to climb a flight of steps vs walk further "up the grade" to where there is a 2 step entry. Patient states she does better with the flight of steps and her daughter is always with her. Agreed she would also have daughter with her when she showers. Reviewed basic HEP she should be doing while here and until HHPT begins. No further questions or needs per pt.     Recommendations for follow up therapy are one component of a multi-disciplinary discharge planning process, led by the attending physician.  Recommendations may be updated based on patient status, additional functional criteria and insurance authorization.  Follow Up Recommendations  Home health PT     Assistance Recommended at Discharge Intermittent Supervision/Assistance  Patient can return home with the following Help with stairs or ramp for entrance;A little help with bathing/dressing/bathroom;Assistance with cooking/housework;Direct supervision/assist for medications management   Equipment Recommendations  None recommended by PT    Recommendations for Other Services        Precautions / Restrictions Precautions Precautions: Fall Restrictions Weight Bearing Restrictions: No     Mobility  Bed Mobility               General bed mobility comments: denies problems with bed mobility    Transfers                   General transfer comment: denies issues; wants to conserve energy for going home    Ambulation/Gait                   Stairs Stairs:  (discussed options at her daughter's home and she feels safer on the flight of steps to enter with daughter's assist than she does walking further up the grade to where there are 2 steps; did not feel the need to practice)           Wheelchair Mobility    Modified Rankin (Stroke Patients Only)       Balance                                            Cognition Arousal/Alertness: Awake/alert Behavior During Therapy: WFL for tasks assessed/performed Overall Cognitive Status: Within Functional Limits for tasks assessed                                 General Comments: had made plans to go stay with her daughter with 24/7 supervision/assist as needed; made  arrangements to have humidifier from her home oxygen moved to concentrator at her daughter's home; did not recall to have shower chair moved to her daughter's home        Exercises Other Exercises Other Exercises: pt return demonstrated ankle pumps, LAQ, hip marching, hip abduction (all in sitting) as good exercises to work on when not up walking to help her build her strength and endurance (5 reps each)    General Comments        Pertinent Vitals/Pain Pain Assessment Pain Assessment: No/denies pain    Home Living                          Prior Function            PT Goals (current goals can now be found in the care plan section) Acute Rehab PT Goals Patient Stated Goal: To get back to walking in the woods, decrease O2 needs. Time For Goal Achievement:  04/11/22 Potential to Achieve Goals: Fair Progress towards PT goals: Not progressing toward goals - comment (pt refused mobility)    Frequency    Min 3X/week      PT Plan Discharge plan needs to be updated    Co-evaluation              AM-PAC PT "6 Clicks" Mobility   Outcome Measure  Help needed turning from your back to your side while in a flat bed without using bedrails?: None Help needed moving from lying on your back to sitting on the side of a flat bed without using bedrails?: None Help needed moving to and from a bed to a chair (including a wheelchair)?: A Little Help needed standing up from a chair using your arms (e.g., wheelchair or bedside chair)?: A Little Help needed to walk in hospital room?: A Little Help needed climbing 3-5 steps with a railing? : A Little 6 Click Score: 20    End of Session   Activity Tolerance: Patient tolerated treatment well Patient left: with call bell/phone within reach;in chair   PT Visit Diagnosis: Other abnormalities of gait and mobility (R26.89)     Time: 1010-1029 PT Time Calculation (min) (ACUTE ONLY): 19 min  Charges:  $Self Care/Home Management: 8-22                      [place]  Pager [phone] Office [phone]    Rexanne Mano 04/08/2022, 10:47 AM

## 2022-04-08 NOTE — TOC Transition Note (Addendum)
Transition of Care (TOC) - CM/SW Discharge Note Marvetta Gibbons RN, BSN Transitions of Care Unit 4E- RN Case Manager See Treatment Team for direct phone #  Cross Coverage for 5W  Patient Details  Name: Martha Mora MRN: [id number] Date of Birth: [birth year]  Transition of Care [facility]) CM/SW Contact:  Martha Patricia, RN Phone Number: 04/08/2022, 3:00 PM   Clinical Narrative:    Pt stable for transition home today, recommendations have been updated from SNF to home w/ [facility]. Orders placed for HHRN/PT/OT. Pt has needed DME at home, including home 02 w/ [employer], cane, RW and scooter.  Note plan is for pt to go to daughter's home.   Per CSW pt would like to use [facility] again for [facility] services as she has used them in past (Feb/23).  Call made to [place] w/ [place] for [facility] referral. - referral pending.  Update- 2542- per Martha Mora w/ [facility]- office saying they are not able to accept referral at this time.   Call made to pt at home to discuss alternate [facility] choice and notify that [facility] unable to service at this time, msg left and awaiting return call from pt  1500- still awaiting return call from patient regarding [facility] services.  1600- still no return call from pt, call made to daughter Martha Mora, also had to leaver msg- await return call from daughter to discuss [facility].   Post discharge- 5/23-1000- TC made to patient- no answer, TC made to daughter Martha Mora- who answered and had patient come to phone. Was able to speak with pt about [facility] and that they were unable to accept referral- choice offered for alternate agency- pt states she does not have a preference and defers to this CM to secure [facility] on her behalf.  Address for daughter's home confirmed with pt as: [address] [place] [address] Daughter Martha Mora- cell- [phone],   Call made to Martha Mora for [facility] referral- referral has been accepted for HHPT/OT for start of care, Alvis Lemmings will add nursing later as staffing allows. [facility] will  see pt for nursing community care through embedded provider, CM notified V. Brewer that [facility] has been secured.   Call made back to pt/daughter to update on [facility] arrangements- spoke with daughter Martha Mora, who voiced appreciation. Martha Mora also asked about long term nursing involvement to prevent re-hospitalizations which this CM explained [facility] involvement.   Final next level of care: [place] Barriers to Discharge: Barriers Resolved   Patient Goals and CMS Choice Patient states their goals for this hospitalization and ongoing recovery are:: To get back home CMS Medicare.gov Compare Post Acute Care list provided to:: Patient Choice offered to / list presented to : Patient  Discharge Placement                       Discharge Plan and Services   Discharge Planning Services: CM Consult Post Acute Care Choice: Home Health          DME Arranged: N/A DME Agency: NA       HH Arranged: RN, PT, OT   Date [place] Agency Contacted: 04/08/22 Time [place]: 1607    Social Determinants of Health (SDOH) Interventions     Readmission Risk Interventions    04/08/2022    2:59 PM 04/02/2022    2:42 PM 03/25/2022    4:51 PM  Readmission Risk Prevention Plan  Transportation Screening Complete Complete Complete  PCP or Specialist Appt within  3-5 Days Complete  Not Complete  Not Complete comments   Patient still urrently in ICU  [place] or [place] Complete  Complete  Social Work Consult for [place] Planning/Counseling Complete  Complete  Palliative Care Screening Not Applicable  Not Applicable  Medication Review (RN Care Manager)  Referral to Pharmacy Referral to Pharmacy  PCP or Specialist appointment within 3-5 days of discharge  Complete   HRI or Home Care Consult  Complete   SW Recovery Care/Counseling Consult  Complete   Palliative Care Screening  Not [place]  Complete

## 2022-04-09 ENCOUNTER — Encounter: Payer: Self-pay | Admitting: Family Medicine

## 2022-04-09 ENCOUNTER — Inpatient Hospital Stay: Payer: Medicare Other | Admitting: Family Medicine

## 2022-04-09 ENCOUNTER — Other Ambulatory Visit: Payer: Self-pay

## 2022-04-09 NOTE — Consult Note (Signed)
   [facility] Inpatient Consult   04/09/2022  [name] 1946/10/06 [id number]  [place] [employer] [ACO] Patient: [name]  Late entry:  04/08/2022  1500 patient already transitioned home.  Primary Care Provider:  [name], DO, [facility], is an embedded provider with a Chronic Care Management team and program, and is listed for the transition of care follow up and appointments.  Patient was  in need of [facility] care  Plan: Notification sent to the [place]  to make aware TOC needs for post hospital needs for home health not completed.  Please contact for further questions,  Natividad Brood, RN BSN [facility] Liaison  [phone] business mobile phone Toll free office [phone]  Fax number: [phone] [email or web address]

## 2022-04-10 MED ORDER — RAMELTEON 8 MG PO TABS: 8.0000 mg | ORAL_TABLET | Freq: Every day | ORAL | 2 refills | Status: DC

## 2022-04-11 ENCOUNTER — Telehealth: Payer: Self-pay | Admitting: *Deleted

## 2022-04-11 NOTE — Chronic Care Management (AMB) (Signed)
  Care Coordination Note  04/11/2022 Name: [name] MRN: [id number] DOB: 1946-05-03  Martha Mora is a 76 y.o. year old female who is a primary care patient of [name], DO and is actively engaged with the care management team. I reached out to [name] by phone today to assist with scheduling a follow up visit with the RN Case Manager  Follow up plan: Unsuccessful telephone outreach attempt made. A HIPAA compliant phone message was left for the patient providing contact information and requesting a return call.  The care management team will reach out to the patient again over the next 3-5 days.  If patient returns call to provider office, please advise to call [place]  at [phone].  [place] Management  Direct Dial: [phone]

## 2022-04-12 ENCOUNTER — Ambulatory Visit (INDEPENDENT_AMBULATORY_CARE_PROVIDER_SITE_OTHER): Payer: Medicare Other | Admitting: Family Medicine

## 2022-04-12 VITALS — BP 108/48 | HR 64 | Ht 61.0 in | Wt 226.0 lb

## 2022-04-12 DIAGNOSIS — F419 Anxiety disorder, unspecified: Secondary | ICD-10-CM

## 2022-04-12 DIAGNOSIS — I10 Essential (primary) hypertension: Secondary | ICD-10-CM

## 2022-04-12 DIAGNOSIS — J9611 Chronic respiratory failure with hypoxia: Secondary | ICD-10-CM

## 2022-04-12 DIAGNOSIS — R040 Epistaxis: Secondary | ICD-10-CM | POA: Diagnosis not present

## 2022-04-12 NOTE — Patient Instructions (Addendum)
For information on therapists, please go to [email or web address].   You can also contact your insurance company to find an in-network therapist.   We will check your blood counts and electrolytes today.

## 2022-04-12 NOTE — Assessment & Plan Note (Signed)
Patient continues on BuSpar 15 mg 3 times daily as well as Cymbalta 60 mg daily Patient plans to discuss with PCP at follow-up visit in June 2023 Recommended that patient establish with therapist to help with managing symptoms of anxiety as these appear to be more related to when patient is alone or encountering stressful situations, believe that she will benefit from discussing coping mechanisms to deal with stressful situations  did not recommend patient restart benzodiazepines given her recent bouts of delirium while hospitalized, patient voiced understanding

## 2022-04-12 NOTE — Progress Notes (Signed)
SUBJECTIVE:   CHIEF COMPLAINT / HPI: hospital follow up  Patient was admitted after episode of epistaxis and presents for follow up CBC and BMP. Her eliquis was restarted. She denies any further issues with bleeding. She is also on oxygen full time now and states that using the distilled water to moisturize her nasal passages. She reports regularly taking her eliquis since discharged. She has not had any episodes of dizziness or falls. She reports using a walker or cane at home.   Anxiety  Patient states that she has been having increased anxiety because she has not been able to sleep. Patient reports having multiple panic attacks in response to anything out of the ordinary. She reports that shortness, blurry vision, and feeling like she cannot function because this occurs throughout the day. She has been taking buspar three times daily but feels that this does not help. In the past she was taking valium that helped. She reports these symptoms worsened after the death of her husband.    PERTINENT  PMH / PSH:  HTN  CAD  AF  HFpEF  OSA  COPD  GERD  OBJECTIVE:   BP (!) 108/48   Pulse 64   Ht '5\' 1"'$  (1.549 m)   Wt 226 lb (102.5 kg) Comment: unable to weight  SpO2 94%   BMI 42.70 kg/m   Physical Exam HENT:     Nose: No nasal deformity, signs of injury, nasal tenderness, mucosal edema, congestion or rhinorrhea.     Right Nostril: No epistaxis or occlusion.     Left Nostril: No epistaxis or occlusion.     Right Turbinates: Not enlarged or swollen.     Left Turbinates: Not enlarged or swollen.  Cardiovascular:     Rate and Rhythm: Normal rate and regular rhythm.     Heart sounds: Normal heart sounds.    No friction rub.  Pulmonary:     Effort: Pulmonary effort is normal. No respiratory distress.     Breath sounds: Rales present. No wheezing or rhonchi.     Comments: Crackles at bases bilaterally Abdominal:     General: Bowel sounds are normal.     Tenderness: There is no  abdominal tenderness.  Psychiatric:        Attention and Perception: She is attentive. She does not perceive auditory or visual hallucinations.        Mood and Affect: Mood is not elated. Affect is not labile, tearful or inappropriate.        Speech: Speech normal.        Behavior: Behavior is cooperative.        Thought Content: Thought content does not include homicidal or suicidal ideation. Thought content does not include homicidal or suicidal plan.         04/12/2022    9:50 AM 03/11/2022    3:45 PM 03/05/2021    1:50 PM 02/05/2021   10:50 AM  GAD 7 : Generalized Anxiety Score  Nervous, Anxious, on Edge 3 0 3 3  Control/stop worrying 0 0 1 1  Worry too much - different things 3 0 1 1  Trouble relaxing 1 0 1 1  Restless 3 0 2 1  Easily annoyed or irritable 2 0 1 0  Afraid - awful might happen 0 0 0 0  Total GAD 7 Score 12 0 9 7  Anxiety Difficulty Very difficult        ASSESSMENT/PLAN:   Anxiety Patient continues on BuSpar 15  mg 3 times daily as well as Cymbalta 60 mg daily Patient plans to discuss with PCP at follow-up visit in June 2023 Recommended that patient establish with therapist to help with managing symptoms of anxiety as these appear to be more related to when patient is alone or encountering stressful situations, believe that she will benefit from discussing coping mechanisms to deal with stressful situations  did not recommend patient restart benzodiazepines given her recent bouts of delirium while hospitalized, patient voiced understanding  Epistaxis requiring cauterization Patient presents for hospital follow-up for this problem She has had no further episodes of epistasis or bleeding in general Limited nasal exam today however mucosa appears well-healed at this time, no evidence of bleeding Patient to continue Eliquis 5 mg twice daily CBC collected today   Eulis Foster, MD [place]

## 2022-04-12 NOTE — Assessment & Plan Note (Addendum)
Patient presents for hospital follow-up for this problem She has had no further episodes of epistasis or bleeding in general Limited nasal exam today however mucosa appears well-healed at this time, no evidence of bleeding Patient to continue Eliquis 5 mg twice daily CBC collected today

## 2022-04-13 LAB — BASIC METABOLIC PANEL
BUN/Creatinine Ratio: 13 (ref 12–28)
BUN: 11 mg/dL (ref 8–27)
CO2: 25 mmol/L (ref 20–29)
Calcium: 8.7 mg/dL (ref 8.7–10.3)
Chloride: 100 mmol/L (ref 96–106)
Creatinine, Ser: 0.86 mg/dL (ref 0.57–1.00)
Glucose: 115 mg/dL — ABNORMAL HIGH (ref 70–99)
Potassium: 4 mmol/L (ref 3.5–5.2)
Sodium: 143 mmol/L (ref 134–144)
eGFR: 69 mL/min/{1.73_m2} (ref >59)

## 2022-04-13 LAB — CBC
Hematocrit: 30 % — ABNORMAL LOW (ref 34.0–46.6)
Hemoglobin: 9.3 g/dL — ABNORMAL LOW (ref 11.1–15.9)
MCH: 28.4 pg (ref 26.6–33.0)
MCHC: 31 g/dL — ABNORMAL LOW (ref 31.5–35.7)
MCV: 92 fL (ref 79–97)
Platelets: 287 10*3/uL (ref 150–450)
RBC: 3.28 x10E6/uL — ABNORMAL LOW (ref 3.77–5.28)
RDW: 13.3 % (ref 11.7–15.4)
WBC: 10.8 10*3/uL (ref 3.4–10.8)

## 2022-04-16 DIAGNOSIS — G4733 Obstructive sleep apnea (adult) (pediatric): Secondary | ICD-10-CM | POA: Diagnosis not present

## 2022-04-16 DIAGNOSIS — J449 Chronic obstructive pulmonary disease, unspecified: Secondary | ICD-10-CM | POA: Diagnosis not present

## 2022-04-16 DIAGNOSIS — I110 Hypertensive heart disease with heart failure: Secondary | ICD-10-CM | POA: Diagnosis not present

## 2022-04-16 DIAGNOSIS — I5033 Acute on chronic diastolic (congestive) heart failure: Secondary | ICD-10-CM | POA: Diagnosis not present

## 2022-04-16 DIAGNOSIS — E785 Hyperlipidemia, unspecified: Secondary | ICD-10-CM | POA: Diagnosis not present

## 2022-04-16 NOTE — Chronic Care Management (AMB) (Signed)
  Care Coordination Note  04/16/2022 Name: [name] MRN: [id number] DOB: Apr 12, 1946  Martha Mora is a 76 y.o. year old female who is a primary care patient of [name], DO and is actively engaged with the care management team. I reached out to [name] by phone today to assist with re-scheduling a follow up visit with the RN Case Manager  Follow up plan: A second unsuccessful telephone outreach attempt made. A HIPAA compliant phone message was left for the patient providing contact information and requesting a return call. The care management team will reach out to the patient again over the next 7 days. If patient returns call to provider office, please advise to call [place] at [phone].  [place] Management  Direct Dial: [phone]

## 2022-04-18 ENCOUNTER — Ambulatory Visit: Payer: Medicare Other

## 2022-04-18 ENCOUNTER — Telehealth: Payer: Self-pay

## 2022-04-18 NOTE — Telephone Encounter (Signed)
[facility] PT calls nurse line requesting verbal orders for [facility] PT as follows.   2x a week for 2 weeks  1x a week for 2 weeks   Verbal orders given.

## 2022-04-22 ENCOUNTER — Other Ambulatory Visit (HOSPITAL_COMMUNITY): Payer: Self-pay

## 2022-04-22 ENCOUNTER — Telehealth: Payer: Self-pay

## 2022-04-22 ENCOUNTER — Encounter (INDEPENDENT_AMBULATORY_CARE_PROVIDER_SITE_OTHER): Payer: Medicare Other | Admitting: Ophthalmology

## 2022-04-22 NOTE — Telephone Encounter (Signed)
Patient calls nurse line requesting a refill on blood thinner.   Patient reports she was told she was going to be switched over to Xarelto.   Will forward to PCP.

## 2022-04-23 ENCOUNTER — Other Ambulatory Visit: Payer: Self-pay | Admitting: Family Medicine

## 2022-04-23 ENCOUNTER — Encounter: Payer: Self-pay | Admitting: *Deleted

## 2022-04-23 ENCOUNTER — Other Ambulatory Visit (HOSPITAL_COMMUNITY): Payer: Self-pay

## 2022-04-23 MED ORDER — APIXABAN 5 MG PO TABS: 5.0000 mg | ORAL_TABLET | Freq: Two times a day (BID) | ORAL | 0 refills | Status: DC

## 2022-04-23 NOTE — Chronic Care Management (AMB) (Signed)
  Care Coordination Note  04/23/2022 Name: Martha Mora MRN: [id number] DOB: 1946/05/18  Martha Mora is a 75 y.o. year old female who is a primary care patient of [name], DO and is actively engaged with the care management team. I reached out to [name] by phone today to assist with re-scheduling a follow up visit with the RN Case Manager  Follow up plan: A third unsuccessful telephone outreach attempt made. A HIPAA compliant phone message was left for the patient providing contact information and requesting a return call. Unable to make contact on outreach attempts x 3. PCP [name], DO notified via routed documentation in medical record. We have been unable to make contact with the patient for follow up. The care management team is available to follow up with the patient after provider conversation with the patient regarding recommendation for care management engagement and subsequent re-referral to the care management team.   [place] Management  Direct Dial: [phone]

## 2022-04-23 NOTE — Telephone Encounter (Signed)
Patient returns call to nurse line.   Patient reports she was told during recent hospital stay that she would be switching to Xarelto once she finished her bottle of Eliquis.   Patient reports as of today is out of a blood thinner.   Patient does have an apt with PCP on 6/9.

## 2022-04-24 DIAGNOSIS — I110 Hypertensive heart disease with heart failure: Secondary | ICD-10-CM | POA: Diagnosis not present

## 2022-04-24 DIAGNOSIS — J449 Chronic obstructive pulmonary disease, unspecified: Secondary | ICD-10-CM | POA: Diagnosis not present

## 2022-04-24 DIAGNOSIS — E785 Hyperlipidemia, unspecified: Secondary | ICD-10-CM | POA: Diagnosis not present

## 2022-04-24 DIAGNOSIS — I5033 Acute on chronic diastolic (congestive) heart failure: Secondary | ICD-10-CM | POA: Diagnosis not present

## 2022-04-24 DIAGNOSIS — G4733 Obstructive sleep apnea (adult) (pediatric): Secondary | ICD-10-CM | POA: Diagnosis not present

## 2022-04-25 ENCOUNTER — Telehealth: Payer: Self-pay

## 2022-04-25 ENCOUNTER — Ambulatory Visit: Payer: Medicare Other

## 2022-04-25 NOTE — Telephone Encounter (Signed)
Patient calls nurse lie reporting a UTI.  Patient reports symptoms started yesterday with "slight" burning, urinary frequency and little output. Patient reports she took an OTC Azo test and results came back positive.  Patient denies fevers, chills, abdominal pain, flank pain or blood.   Patient had an apt today, however had to cancel due to lack of transportation.   Patients reports she is very scared she is going to end up in the hospital again due to infection.   Patient rescheduled for tomorrow. Patient is hopeful she will have transportation.   Patient is requesting to start treatment today.   Precautions given.   Will forward to PCP.

## 2022-04-26 ENCOUNTER — Encounter: Payer: Self-pay | Admitting: Family Medicine

## 2022-04-26 ENCOUNTER — Ambulatory Visit: Payer: Medicare Other | Admitting: Family Medicine

## 2022-04-26 ENCOUNTER — Ambulatory Visit (INDEPENDENT_AMBULATORY_CARE_PROVIDER_SITE_OTHER): Payer: Medicare Other | Admitting: Family Medicine

## 2022-04-26 VITALS — BP 120/55 | HR 71 | Ht 61.0 in | Wt 225.4 lb

## 2022-04-26 DIAGNOSIS — F419 Anxiety disorder, unspecified: Secondary | ICD-10-CM

## 2022-04-26 DIAGNOSIS — L821 Other seborrheic keratosis: Secondary | ICD-10-CM

## 2022-04-26 DIAGNOSIS — R300 Dysuria: Secondary | ICD-10-CM

## 2022-04-26 DIAGNOSIS — G2581 Restless legs syndrome: Secondary | ICD-10-CM | POA: Diagnosis not present

## 2022-04-26 LAB — POCT URINALYSIS DIP (MANUAL ENTRY)
Bilirubin, UA: NEGATIVE
Blood, UA: NEGATIVE
Glucose, UA: NEGATIVE mg/dL
Ketones, POC UA: NEGATIVE mg/dL
Leukocytes, UA: NEGATIVE
Nitrite, UA: NEGATIVE
Spec Grav, UA: 1.025 (ref 1.010–1.025)
Urobilinogen, UA: 0.2 E.U./dL
pH, UA: 5.5 (ref 5.0–8.0)

## 2022-04-26 MED ORDER — PREGABALIN 25 MG PO CAPS: 75.0000 mg | ORAL_CAPSULE | Freq: Two times a day (BID) | ORAL | 0 refills | Status: DC

## 2022-04-26 NOTE — Patient Instructions (Addendum)
For you restless leg syndrome: start 50 mg (2-tablets) twice a day then increase to 75 mg (3-tablets) twice a day.  We will then send in a 75 mg tablet at your next refill.   It is okay to keep taking the AZO.  Your urine did not show any signs of infection. I sent a urine culture to the lab.  It will take 1-2 days for this to result.  I will let you know if this grows bacteria.   Follow up with me in 2 weeks on 05/13/22 at 11:30 AM.

## 2022-04-26 NOTE — Progress Notes (Unsigned)
   SUBJECTIVE:   CHIEF COMPLAINT / HPI:   Chief Complaint  Patient presents with   Medication Refill     Martha Mora is a 76 y.o. female here for:    Pt reports " whole body restless leg syndrome."  She is not sleeping at night.  Feels anxious all the time now. Notes she was discharged from the hospital with a decreased dose of pregablin. Has used some left over Trazadone and notes it did note aggravate her RLS.   Reports slight dysuria for the past 3 days.  Denies urgency, frequency, vaginal discharge, vaginal bleeding, recent antibiotic use.  Endorses small amounts of urine when she does go to the bathroom.  Took Azo for 2 days without complete relief.  States that the strips that she used turned purple.  She is afraid that she may have a UTI and will end up in the hospital.  This is an increase her anxiety as well.   PERTINENT  PMH / PSH: reviewed and updated as appropriate   OBJECTIVE:   BP (!) 120/55   Pulse 71   Ht '5\' 1"'$  (1.549 m)   Wt 225 lb 6 oz (102.2 kg)   SpO2 96% Comment: with oxygen  BMI 42.58 kg/m    GEN: pleasant well appearing female, in no acute distress  CV: regular rate and rhythm RESP: no increased work of breathing, wearing [place], faint basilar rales  MSK: no edema, or calf tenderness SKIN: warm, dry, flesh tone seborrheic keratoses (SK) on right forehead near her eye and hairline, additional SK on left frontopariteal area    PROCEDURE: Cryotherapy         The area surrounding the skin lesion was prepared in the usual sterile manner. A test freeze was performed ensuring coverage of entire area as above.  The cryotherapy gun was then applied for 3 seconds until an ice ball formed with a 5-7 mm border.  This was allowed to thaw and then the cryotherapy was again applied for 3 seconds to an ice ball of 5-7 mm.        ASSESSMENT/PLAN:   Seborrheic keratoses 5 lesions treated today on face and head.  The patient tolerated the procedure well.  Return  precautions provided.    Anxiety Continue Cymbalta and BuSpar.  Discussed may be restarting Seroquel as she is well controlled with Seroquel previously.  Avoid benzodiazepines if possible.  She would like to see if restarting her dose of pregabalin decreases her restless leg symptoms prior to trial of Seroquel.  Restless leg syndrome Restart 75 mg twice daily pregabalin.  Previously well controlled on 150 mg qhs. I wonder if her uncontrolled RLS is making her anxiety worse.  Consider low-dose trial of Ropinirole if not improving.  Given recent delirium during hospitalization. Will need to be vigilant of side effects. Follow up in 2 weeks  Dysuria UA not indicative of cystitis.  Sent for urine culture.  No antibiotic is indicated at this time.  If culture returns positive, discussed with patient that I will send antibiotics to her pharmacy.  She can use Azo for possible urethral irritation as needed.       [name], DO PGY-3, [facility] 04/28/2022

## 2022-04-28 LAB — URINE CULTURE

## 2022-04-28 MED ORDER — PREGABALIN 75 MG PO CAPS: 75.0000 mg | ORAL_CAPSULE | Freq: Two times a day (BID) | ORAL | 0 refills | Status: DC

## 2022-04-28 NOTE — Assessment & Plan Note (Signed)
UA not indicative of cystitis.  Sent for urine culture.  No antibiotic is indicated at this time.  If culture returns positive, discussed with patient that I will send antibiotics to her pharmacy.  She can use Azo for possible urethral irritation as needed.

## 2022-04-28 NOTE — Assessment & Plan Note (Signed)
Restart 75 mg twice daily pregabalin.  Previously well controlled on 150 mg qhs. I wonder if her uncontrolled RLS is making her anxiety worse.  Consider low-dose trial of Ropinirole if not improving.  Given recent delirium during hospitalization. Will need to be vigilant of side effects. Follow up in 2 weeks

## 2022-04-28 NOTE — Assessment & Plan Note (Signed)
5 lesions treated today on face and head.  The patient tolerated the procedure well.  Return precautions provided.

## 2022-04-28 NOTE — Assessment & Plan Note (Signed)
Continue Cymbalta and BuSpar.  Discussed may be restarting Seroquel as she is well controlled with Seroquel previously.  Avoid benzodiazepines if possible.  She would like to see if restarting her dose of pregabalin decreases her restless leg symptoms prior to trial of Seroquel.

## 2022-04-29 ENCOUNTER — Encounter (INDEPENDENT_AMBULATORY_CARE_PROVIDER_SITE_OTHER): Payer: Medicare Other | Admitting: Ophthalmology

## 2022-04-29 DIAGNOSIS — H353231 Exudative age-related macular degeneration, bilateral, with active choroidal neovascularization: Secondary | ICD-10-CM

## 2022-04-29 DIAGNOSIS — H35033 Hypertensive retinopathy, bilateral: Secondary | ICD-10-CM | POA: Diagnosis not present

## 2022-04-29 DIAGNOSIS — H33302 Unspecified retinal break, left eye: Secondary | ICD-10-CM | POA: Diagnosis not present

## 2022-04-29 DIAGNOSIS — I10 Essential (primary) hypertension: Secondary | ICD-10-CM | POA: Diagnosis not present

## 2022-04-29 DIAGNOSIS — H43813 Vitreous degeneration, bilateral: Secondary | ICD-10-CM

## 2022-05-02 ENCOUNTER — Encounter: Payer: Self-pay | Admitting: Family Medicine

## 2022-05-02 ENCOUNTER — Ambulatory Visit: Payer: Medicare Other

## 2022-05-02 DIAGNOSIS — R0902 Hypoxemia: Secondary | ICD-10-CM | POA: Diagnosis not present

## 2022-05-02 DIAGNOSIS — J449 Chronic obstructive pulmonary disease, unspecified: Secondary | ICD-10-CM | POA: Diagnosis not present

## 2022-05-02 DIAGNOSIS — R531 Weakness: Secondary | ICD-10-CM | POA: Diagnosis not present

## 2022-05-02 DIAGNOSIS — R0600 Dyspnea, unspecified: Secondary | ICD-10-CM | POA: Diagnosis not present

## 2022-05-03 MED ORDER — TRAZODONE HCL 50 MG PO TABS: 50.0000 mg | ORAL_TABLET | Freq: Every day | ORAL | 0 refills | Status: DC

## 2022-05-04 DIAGNOSIS — R0600 Dyspnea, unspecified: Secondary | ICD-10-CM | POA: Diagnosis not present

## 2022-05-04 DIAGNOSIS — R531 Weakness: Secondary | ICD-10-CM | POA: Diagnosis not present

## 2022-05-04 DIAGNOSIS — R0902 Hypoxemia: Secondary | ICD-10-CM | POA: Diagnosis not present

## 2022-05-04 DIAGNOSIS — J449 Chronic obstructive pulmonary disease, unspecified: Secondary | ICD-10-CM | POA: Diagnosis not present

## 2022-05-13 ENCOUNTER — Ambulatory Visit: Payer: Medicare Other | Admitting: Family Medicine

## 2022-05-16 ENCOUNTER — Ambulatory Visit: Payer: Medicare Other

## 2022-05-25 ENCOUNTER — Other Ambulatory Visit: Payer: Self-pay | Admitting: Family Medicine

## 2022-05-27 ENCOUNTER — Encounter (INDEPENDENT_AMBULATORY_CARE_PROVIDER_SITE_OTHER): Payer: Medicare Other | Admitting: Ophthalmology

## 2022-05-30 ENCOUNTER — Ambulatory Visit (INDEPENDENT_AMBULATORY_CARE_PROVIDER_SITE_OTHER): Payer: Medicare Other | Admitting: Student

## 2022-05-30 ENCOUNTER — Other Ambulatory Visit (HOSPITAL_COMMUNITY): Payer: Self-pay

## 2022-05-30 VITALS — BP 138/68 | HR 76 | Wt 233.8 lb

## 2022-05-30 DIAGNOSIS — C44622 Squamous cell carcinoma of skin of right upper limb, including shoulder: Secondary | ICD-10-CM | POA: Diagnosis not present

## 2022-05-30 DIAGNOSIS — L570 Actinic keratosis: Secondary | ICD-10-CM | POA: Diagnosis not present

## 2022-05-30 DIAGNOSIS — C4492 Squamous cell carcinoma of skin, unspecified: Secondary | ICD-10-CM

## 2022-05-30 DIAGNOSIS — D0461 Carcinoma in situ of skin of right upper limb, including shoulder: Secondary | ICD-10-CM

## 2022-05-30 NOTE — Assessment & Plan Note (Addendum)
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  Procedure: Cryotherapy Location: right dorsal hand  After discussion of the risks, benefits, and alternative therapies available, the patient elected to proceed. After obtaining written informed consent, the patient's identity, procedure, and site were verified during a time out prior to proceeding procedure. The lesion on the right hand were treated using liquid nitrogen spray gun for 6 second per cycle, 3 cycles total. The patient tolerated the procedure well and there were no immediate complications.  Patient was provided aftercare handout and advised to return if lesion did not fully resolved.

## 2022-05-30 NOTE — Patient Instructions (Addendum)
Thank you for coming to see me today. It was a pleasure. 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 We froze it today and it may completely go away. We also froze some other precancerous spots. If these areas become rough then please come back. We will refer you to dermatology for follow up.  Please wear sunscreen daily and hat in the sun to prevent sun damage  Please follow-up with [place] and needed   If you have any questions or concerns, please do not hesitate to call the office at [phone].  Best wishes,   Dr Posey Pronto

## 2022-05-30 NOTE — Progress Notes (Signed)
  SUBJECTIVE:   CHIEF COMPLAINT / HPI:   Follow up for SCC in situ (dx'd via biopsy) on right dorsal hand. Per telephone note, advise cryo and referral to dermatology.   Also concerned of new lesion on right chest that is scaly and several new spots on right and left hand.  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  Procedure: Cryotherapy Location: right dorsal hand  After discussion of the risks, benefits, and alternative therapies available, the patient elected to proceed. After obtaining written informed consent, the patient's identity, procedure, and site were verified during a time out prior to proceeding procedure. The lesion on the right hand were treated using liquid nitrogen spray gun for 6 second per cycle, 3 cycles total. The patient tolerated the procedure well and there were no immediate complications.  Patient was provided aftercare handout and advised to return if lesion did not fully resolved.  Actinic keratoses Procedure: Cryotherapy Location: right dorsal hand x4, forehead x1, left dorsal hand x1  Return if lesions do not resolve. AWells Guiles DO 05/30/2022, 4:59 PM PGY-2, [facility]

## 2022-05-30 NOTE — Assessment & Plan Note (Signed)
Procedure: Cryotherapy Location: right dorsal hand x4, forehead x1, left dorsal hand x1

## 2022-06-01 ENCOUNTER — Encounter: Payer: Self-pay | Admitting: Student

## 2022-06-01 DIAGNOSIS — J449 Chronic obstructive pulmonary disease, unspecified: Secondary | ICD-10-CM | POA: Diagnosis not present

## 2022-06-01 DIAGNOSIS — R531 Weakness: Secondary | ICD-10-CM | POA: Diagnosis not present

## 2022-06-01 DIAGNOSIS — R0902 Hypoxemia: Secondary | ICD-10-CM | POA: Diagnosis not present

## 2022-06-01 DIAGNOSIS — R0600 Dyspnea, unspecified: Secondary | ICD-10-CM | POA: Diagnosis not present

## 2022-06-03 ENCOUNTER — Encounter (INDEPENDENT_AMBULATORY_CARE_PROVIDER_SITE_OTHER): Payer: Medicare Other | Admitting: Ophthalmology

## 2022-06-03 ENCOUNTER — Other Ambulatory Visit: Payer: Self-pay | Admitting: *Deleted

## 2022-06-03 ENCOUNTER — Encounter: Payer: Self-pay | Admitting: Student

## 2022-06-03 DIAGNOSIS — J449 Chronic obstructive pulmonary disease, unspecified: Secondary | ICD-10-CM | POA: Diagnosis not present

## 2022-06-03 DIAGNOSIS — R531 Weakness: Secondary | ICD-10-CM | POA: Diagnosis not present

## 2022-06-03 DIAGNOSIS — R0902 Hypoxemia: Secondary | ICD-10-CM | POA: Diagnosis not present

## 2022-06-03 DIAGNOSIS — R0600 Dyspnea, unspecified: Secondary | ICD-10-CM | POA: Diagnosis not present

## 2022-06-03 DIAGNOSIS — F419 Anxiety disorder, unspecified: Secondary | ICD-10-CM

## 2022-06-03 MED ORDER — DULOXETINE HCL 60 MG PO CPEP: ORAL_CAPSULE | ORAL | 3 refills | Status: DC

## 2022-06-10 ENCOUNTER — Encounter (INDEPENDENT_AMBULATORY_CARE_PROVIDER_SITE_OTHER): Payer: Medicare Other | Admitting: Ophthalmology

## 2022-06-10 DIAGNOSIS — H43813 Vitreous degeneration, bilateral: Secondary | ICD-10-CM

## 2022-06-10 DIAGNOSIS — H33302 Unspecified retinal break, left eye: Secondary | ICD-10-CM

## 2022-06-10 DIAGNOSIS — H35033 Hypertensive retinopathy, bilateral: Secondary | ICD-10-CM | POA: Diagnosis not present

## 2022-06-10 DIAGNOSIS — I10 Essential (primary) hypertension: Secondary | ICD-10-CM

## 2022-06-10 DIAGNOSIS — H353231 Exudative age-related macular degeneration, bilateral, with active choroidal neovascularization: Secondary | ICD-10-CM | POA: Diagnosis not present

## 2022-06-11 ENCOUNTER — Ambulatory Visit: Payer: Medicare Other

## 2022-06-17 DIAGNOSIS — M7061 Trochanteric bursitis, right hip: Secondary | ICD-10-CM | POA: Diagnosis not present

## 2022-06-17 DIAGNOSIS — M533 Sacrococcygeal disorders, not elsewhere classified: Secondary | ICD-10-CM | POA: Diagnosis not present

## 2022-06-19 ENCOUNTER — Institutional Professional Consult (permissible substitution): Payer: Medicare Other | Admitting: Pulmonary Disease

## 2022-06-19 ENCOUNTER — Other Ambulatory Visit: Payer: Self-pay | Admitting: Family Medicine

## 2022-06-19 DIAGNOSIS — J9611 Chronic respiratory failure with hypoxia: Secondary | ICD-10-CM

## 2022-06-22 ENCOUNTER — Other Ambulatory Visit: Payer: Self-pay

## 2022-06-23 ENCOUNTER — Other Ambulatory Visit: Payer: Self-pay | Admitting: Hematology

## 2022-06-23 ENCOUNTER — Other Ambulatory Visit: Payer: Self-pay | Admitting: Family Medicine

## 2022-06-24 ENCOUNTER — Encounter: Payer: Self-pay | Admitting: Student

## 2022-06-25 ENCOUNTER — Encounter: Payer: Self-pay | Admitting: Student

## 2022-06-28 ENCOUNTER — Other Ambulatory Visit: Payer: Self-pay

## 2022-06-28 DIAGNOSIS — G2581 Restless legs syndrome: Secondary | ICD-10-CM

## 2022-06-28 MED ORDER — PREGABALIN 75 MG PO CAPS: 75.0000 mg | ORAL_CAPSULE | Freq: Two times a day (BID) | ORAL | 0 refills | Status: DC

## 2022-07-02 ENCOUNTER — Ambulatory Visit: Payer: Medicare Other | Admitting: Student

## 2022-07-02 DIAGNOSIS — R531 Weakness: Secondary | ICD-10-CM | POA: Diagnosis not present

## 2022-07-02 DIAGNOSIS — R0902 Hypoxemia: Secondary | ICD-10-CM | POA: Diagnosis not present

## 2022-07-02 DIAGNOSIS — J449 Chronic obstructive pulmonary disease, unspecified: Secondary | ICD-10-CM | POA: Diagnosis not present

## 2022-07-02 DIAGNOSIS — R0600 Dyspnea, unspecified: Secondary | ICD-10-CM | POA: Diagnosis not present

## 2022-07-04 DIAGNOSIS — J449 Chronic obstructive pulmonary disease, unspecified: Secondary | ICD-10-CM | POA: Diagnosis not present

## 2022-07-04 DIAGNOSIS — R0902 Hypoxemia: Secondary | ICD-10-CM | POA: Diagnosis not present

## 2022-07-04 DIAGNOSIS — R531 Weakness: Secondary | ICD-10-CM | POA: Diagnosis not present

## 2022-07-04 DIAGNOSIS — R0600 Dyspnea, unspecified: Secondary | ICD-10-CM | POA: Diagnosis not present

## 2022-07-08 ENCOUNTER — Ambulatory Visit: Payer: Medicare Other

## 2022-07-09 DIAGNOSIS — M533 Sacrococcygeal disorders, not elsewhere classified: Secondary | ICD-10-CM | POA: Diagnosis not present

## 2022-07-15 ENCOUNTER — Encounter (INDEPENDENT_AMBULATORY_CARE_PROVIDER_SITE_OTHER): Payer: Medicare Other | Admitting: Ophthalmology
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
## 2022-07-23 ENCOUNTER — Encounter: Payer: Self-pay | Admitting: Pulmonary Disease

## 2022-07-23 ENCOUNTER — Ambulatory Visit (INDEPENDENT_AMBULATORY_CARE_PROVIDER_SITE_OTHER): Payer: Medicare Other

## 2022-07-23 ENCOUNTER — Ambulatory Visit: Payer: Medicare Other | Admitting: Pulmonary Disease

## 2022-07-23 VITALS — BP 134/68 | HR 75 | Temp 98.6°F | Ht 61.0 in | Wt 232.0 lb

## 2022-07-23 DIAGNOSIS — R0600 Dyspnea, unspecified: Secondary | ICD-10-CM | POA: Diagnosis not present

## 2022-07-23 DIAGNOSIS — I509 Heart failure, unspecified: Secondary | ICD-10-CM

## 2022-07-23 DIAGNOSIS — D649 Anemia, unspecified: Secondary | ICD-10-CM | POA: Diagnosis not present

## 2022-07-23 LAB — CBC WITH DIFFERENTIAL/PLATELET
Basophils Absolute: 0.1 10*3/uL (ref 0.0–0.1)
Basophils Relative: 1 % (ref 0.0–3.0)
Eosinophils Absolute: 0.3 10*3/uL (ref 0.0–0.7)
Eosinophils Relative: 2.1 % (ref 0.0–5.0)
HCT: 37.1 % (ref 36.0–46.0)
Hemoglobin: 11.5 g/dL — ABNORMAL LOW (ref 12.0–15.0)
Lymphocytes Relative: 15 % (ref 12.0–46.0)
Lymphs Abs: 1.9 10*3/uL (ref 0.7–4.0)
MCHC: 31 g/dL (ref 30.0–36.0)
MCV: 84 fl (ref 78.0–100.0)
Monocytes Absolute: 1.2 10*3/uL — ABNORMAL HIGH (ref 0.1–1.0)
Monocytes Relative: 9.4 % (ref 3.0–12.0)
Neutro Abs: 9.4 10*3/uL — ABNORMAL HIGH (ref 1.4–7.7)
Neutrophils Relative %: 72.5 % (ref 43.0–77.0)
Platelets: 255 10*3/uL (ref 150.0–400.0)
RBC: 4.41 Mil/uL (ref 3.87–5.11)
RDW: 19.8 % — ABNORMAL HIGH (ref 11.5–15.5)
WBC: 12.9 10*3/uL — ABNORMAL HIGH (ref 4.0–10.5)

## 2022-07-23 LAB — BRAIN NATRIURETIC PEPTIDE: Pro B Natriuretic peptide (BNP): 98 pg/mL (ref 0.0–100.0)

## 2022-07-23 NOTE — Patient Instructions (Signed)
Chronic respiratory failure with hypoxemia: There are many reasons why you may have this, we will do testing to try to pinpoint the exact cause We will check your oxygen while you are walking on room air today We will ask your oxygen supplier to test you for a home oxygen concentrator Until then use 2L continuously  Shortness of breath: Lung function testing Chest x-ray Keep using albuterol 2 puffs every 4-6 hours as needed for shortness of breath Depending on the results of the lung function test we may need to add another inhaler.  Anemia: Check a CBC  Congestive heart failure: Check a BNP  Follow-up in 4 to 6 weeks with either Jaxyn Rout or an APP to go over the results of the chest x-ray, blood work, and lung function test

## 2022-07-24 NOTE — Progress Notes (Signed)
    SUBJECTIVE:   CHIEF COMPLAINT / HPI: Meet PCP  Hypertension: Patient is a 76 y.o. female who present today for follow up of hypertension.   Patient endorses no problems  Home medications include: amlodipine 5 mg daily, lopressor 0.5 mg daily Patient endorses taking these medications as prescribed. Denies any headache, vision changes, or chest pain   Most recent creatinine trend:  Lab Results  Component Value Date   CREATININE 0.86 04/12/2022   CREATININE 1.23 (H) 04/08/2022   CREATININE 1.08 (H) 04/07/2022   Patient has had a BMP in the past 1 year.  RLS Pregabalin 75 mg BID she is taking.. Ferritin 65 six months ago she is only taking 65 of iron daily. Takes a multivitamin.  Does not feel controlled currently.  She is also having insomnia due to RLS.  Chronic respiratory failure with hypoxemia Recently seen by pulmonology, continuing to use 2L continuously. Obtaining PFTs, CXR-showed chronic bronchitis, Using albuterol prn.   Left cheek lesion She says that she had a wart on her face previously that has been removed.  She has had a history of SCC and multiple AK's.  PERTINENT  PMH / PSH: HTN, Afib, MVR, Acute diastolic HF  OBJECTIVE:   BP (!) 132/49   Pulse 74   Ht '5\' 1"'$  (1.549 m)   SpO2 95%   BMI 43.84 kg/m   General: NAD, awake, alert, responsive to questions Head: Normocephalic, left cheek with crusted lesion present, no obvious telangiectasias CV: Regular rate and rhythm no murmurs rubs or gallops Respiratory: Clear to ausculation bilaterally, chest rises symmetrically, no increased work of breathing on 3 L [place] O2 Abdomen: Soft, non-tender, non-distended Extremities: 1+ pitting edema up to knees bilaterally Neuro: No focal deficits Skin: Skin lesions below on the left face and left chest    Left chest   ASSESSMENT/PLAN:   Essential hypertension Overall controlled, continue amlodipine 5 mg daily and Lopressor 0.5 mg daily.  Restless leg  syndrome Pregabalin 75 twice daily Add iron 325 every other day last ferritin was not at goal Patient not taking trazodone currently, attempt trying this to help with sleep at night  Lesion of skin of cheek Patient has lesion on left side on cheek that she says was previously frozen off for a wart but now has a crusted appearance.  No significant telangiectasias on exam.  DDx includes AK.  Patient has history of SCC. -Dermatology clinic referral  Chronic respiratory failure [facility]) Patient followed up with pulmonology yesterday.  EKG with chronic bronchitis.  Patient is going to get PFTs with them.  Currently taking albuterol twice daily.  Overall stable today.    Gerrit Heck, MD [place]

## 2022-07-26 ENCOUNTER — Other Ambulatory Visit: Payer: Self-pay

## 2022-07-26 ENCOUNTER — Ambulatory Visit (INDEPENDENT_AMBULATORY_CARE_PROVIDER_SITE_OTHER): Payer: Medicare Other | Admitting: Student

## 2022-07-26 VITALS — BP 132/49 | HR 74 | Ht 61.0 in

## 2022-07-26 DIAGNOSIS — G2581 Restless legs syndrome: Secondary | ICD-10-CM

## 2022-07-26 DIAGNOSIS — J9612 Chronic respiratory failure with hypercapnia: Secondary | ICD-10-CM

## 2022-07-26 DIAGNOSIS — J9611 Chronic respiratory failure with hypoxia: Secondary | ICD-10-CM

## 2022-07-26 DIAGNOSIS — I10 Essential (primary) hypertension: Secondary | ICD-10-CM

## 2022-07-26 DIAGNOSIS — L989 Disorder of the skin and subcutaneous tissue, unspecified: Secondary | ICD-10-CM | POA: Diagnosis not present

## 2022-07-26 MED ORDER — IRON 325 (65 FE) MG PO TABS: 1.0000 | ORAL_TABLET | ORAL | 3 refills | Status: DC

## 2022-07-26 MED ORDER — PREGABALIN 75 MG PO CAPS: 75.0000 mg | ORAL_CAPSULE | Freq: Two times a day (BID) | ORAL | 0 refills | Status: DC

## 2022-07-26 NOTE — Assessment & Plan Note (Signed)
Patient followed up with pulmonology yesterday.  EKG with chronic bronchitis.  Patient is going to get PFTs with them.  Currently taking albuterol twice daily.  Overall stable today.

## 2022-07-26 NOTE — Assessment & Plan Note (Signed)
Pregabalin 75 twice daily Add iron 325 every other day last ferritin was not at goal Patient not taking trazodone currently, attempt trying this to help with sleep at night

## 2022-07-26 NOTE — Patient Instructions (Signed)
It was great to see you! Thank you for allowing me to participate in your care!   I recommend that you always bring your medications to each appointment as this makes it easy to ensure we are on the correct medications and helps [place] not miss when refills are needed.  Our plans for today:  - please try trazodone at night to help with sleeping - please take iron tablets- I have sent them in to help as well - I am referring you to dermatology clinic as well  Take care and seek immediate care sooner if you develop any concerns. Please remember to show up 15 minutes before your scheduled appointment time!  Gerrit Heck, MD [place]

## 2022-07-26 NOTE — Assessment & Plan Note (Signed)
Patient has lesion on left side on cheek that she says was previously frozen off for a wart but now has a crusted appearance.  No significant telangiectasias on exam.  DDx includes AK.  Patient has history of SCC. -Dermatology clinic referral

## 2022-07-26 NOTE — Assessment & Plan Note (Signed)
Overall controlled, continue amlodipine 5 mg daily and Lopressor 0.5 mg daily.

## 2022-07-29 ENCOUNTER — Ambulatory Visit: Payer: Medicare Other

## 2022-07-29 ENCOUNTER — Other Ambulatory Visit: Payer: Self-pay | Admitting: Hematology

## 2022-07-29 ENCOUNTER — Other Ambulatory Visit: Payer: Self-pay

## 2022-07-29 DIAGNOSIS — E559 Vitamin D deficiency, unspecified: Secondary | ICD-10-CM

## 2022-07-29 MED ORDER — PREGABALIN 75 MG PO CAPS: 75.0000 mg | ORAL_CAPSULE | Freq: Two times a day (BID) | ORAL | 0 refills | Status: DC

## 2022-07-29 NOTE — Telephone Encounter (Signed)
Patient calls nurse line regarding issues with pregabalin prescription. Prescription was supposed to be sent to [facility] on [address].   Prescription was set to "print"  Forwarding to Dr. Jinny Sanders. Rx will need to be resent, as this is controlled.   Talbot Grumbling, RN

## 2022-07-29 NOTE — Addendum Note (Signed)
Addended by: Talbot Grumbling on: 07/29/2022 01:08 PM   Modules accepted: Orders

## 2022-08-02 DIAGNOSIS — R531 Weakness: Secondary | ICD-10-CM | POA: Diagnosis not present

## 2022-08-02 DIAGNOSIS — R0600 Dyspnea, unspecified: Secondary | ICD-10-CM | POA: Diagnosis not present

## 2022-08-02 DIAGNOSIS — J449 Chronic obstructive pulmonary disease, unspecified: Secondary | ICD-10-CM | POA: Diagnosis not present

## 2022-08-02 DIAGNOSIS — R0902 Hypoxemia: Secondary | ICD-10-CM | POA: Diagnosis not present

## 2022-08-04 DIAGNOSIS — R0902 Hypoxemia: Secondary | ICD-10-CM | POA: Diagnosis not present

## 2022-08-04 DIAGNOSIS — R0600 Dyspnea, unspecified: Secondary | ICD-10-CM | POA: Diagnosis not present

## 2022-08-04 DIAGNOSIS — R531 Weakness: Secondary | ICD-10-CM | POA: Diagnosis not present

## 2022-08-04 DIAGNOSIS — J449 Chronic obstructive pulmonary disease, unspecified: Secondary | ICD-10-CM | POA: Diagnosis not present

## 2022-08-09 ENCOUNTER — Encounter (INDEPENDENT_AMBULATORY_CARE_PROVIDER_SITE_OTHER): Payer: Medicare Other | Admitting: Ophthalmology

## 2022-08-09 ENCOUNTER — Other Ambulatory Visit: Payer: Self-pay

## 2022-08-09 MED ORDER — APIXABAN 5 MG PO TABS: 5.0000 mg | ORAL_TABLET | Freq: Two times a day (BID) | ORAL | 0 refills | Status: DC

## 2022-08-10 ENCOUNTER — Other Ambulatory Visit (HOSPITAL_COMMUNITY): Payer: Self-pay

## 2022-08-13 ENCOUNTER — Encounter (INDEPENDENT_AMBULATORY_CARE_PROVIDER_SITE_OTHER): Payer: Medicare Other | Admitting: Ophthalmology

## 2022-08-16 ENCOUNTER — Ambulatory Visit: Payer: Medicare Other

## 2022-08-20 ENCOUNTER — Inpatient Hospital Stay (HOSPITAL_BASED_OUTPATIENT_CLINIC_OR_DEPARTMENT_OTHER)
Admission: EM | Admit: 2022-08-20 | Discharge: 2022-08-23 | DRG: 322 | Disposition: A | Discharge status: Home / Self Care | Payer: Medicare Other | Attending: Cardiovascular Disease | Admitting: Cardiovascular Disease

## 2022-08-20 ENCOUNTER — Other Ambulatory Visit: Payer: Self-pay

## 2022-08-20 ENCOUNTER — Encounter (HOSPITAL_BASED_OUTPATIENT_CLINIC_OR_DEPARTMENT_OTHER): Payer: Self-pay

## 2022-08-20 ENCOUNTER — Emergency Department (HOSPITAL_BASED_OUTPATIENT_CLINIC_OR_DEPARTMENT_OTHER): Payer: Medicare Other | Admitting: Radiology

## 2022-08-20 DIAGNOSIS — Z825 Family history of asthma and other chronic lower respiratory diseases: Secondary | ICD-10-CM

## 2022-08-20 DIAGNOSIS — I480 Paroxysmal atrial fibrillation: Secondary | ICD-10-CM | POA: Diagnosis present

## 2022-08-20 DIAGNOSIS — K2100 Gastro-esophageal reflux disease with esophagitis, without bleeding: Secondary | ICD-10-CM | POA: Diagnosis present

## 2022-08-20 DIAGNOSIS — Z853 Personal history of malignant neoplasm of breast: Secondary | ICD-10-CM | POA: Diagnosis not present

## 2022-08-20 DIAGNOSIS — Z888 Allergy status to other drugs, medicaments and biological substances status: Secondary | ICD-10-CM | POA: Diagnosis not present

## 2022-08-20 DIAGNOSIS — I2510 Atherosclerotic heart disease of native coronary artery without angina pectoris: Secondary | ICD-10-CM | POA: Diagnosis not present

## 2022-08-20 DIAGNOSIS — Z8679 Personal history of other diseases of the circulatory system: Secondary | ICD-10-CM

## 2022-08-20 DIAGNOSIS — F17290 Nicotine dependence, other tobacco product, uncomplicated: Secondary | ICD-10-CM | POA: Diagnosis present

## 2022-08-20 DIAGNOSIS — G2581 Restless legs syndrome: Secondary | ICD-10-CM | POA: Diagnosis present

## 2022-08-20 DIAGNOSIS — Z8719 Personal history of other diseases of the digestive system: Secondary | ICD-10-CM

## 2022-08-20 DIAGNOSIS — F32A Depression, unspecified: Secondary | ICD-10-CM | POA: Diagnosis not present

## 2022-08-20 DIAGNOSIS — Z7902 Long term (current) use of antithrombotics/antiplatelets: Secondary | ICD-10-CM

## 2022-08-20 DIAGNOSIS — M1909 Primary osteoarthritis, other specified site: Secondary | ICD-10-CM | POA: Diagnosis present

## 2022-08-20 DIAGNOSIS — Z8249 Family history of ischemic heart disease and other diseases of the circulatory system: Secondary | ICD-10-CM

## 2022-08-20 DIAGNOSIS — Z79899 Other long term (current) drug therapy: Secondary | ICD-10-CM

## 2022-08-20 DIAGNOSIS — Z981 Arthrodesis status: Secondary | ICD-10-CM

## 2022-08-20 DIAGNOSIS — Z87442 Personal history of urinary calculi: Secondary | ICD-10-CM

## 2022-08-20 DIAGNOSIS — Z880 Allergy status to penicillin: Secondary | ICD-10-CM

## 2022-08-20 DIAGNOSIS — I5033 Acute on chronic diastolic (congestive) heart failure: Secondary | ICD-10-CM | POA: Diagnosis not present

## 2022-08-20 DIAGNOSIS — Z91013 Allergy to seafood: Secondary | ICD-10-CM

## 2022-08-20 DIAGNOSIS — I4891 Unspecified atrial fibrillation: Principal | ICD-10-CM

## 2022-08-20 DIAGNOSIS — Z800 Family history of malignant neoplasm of digestive organs: Secondary | ICD-10-CM

## 2022-08-20 DIAGNOSIS — Z6841 Body Mass Index (BMI) 40.0 and over, adult: Secondary | ICD-10-CM | POA: Diagnosis not present

## 2022-08-20 DIAGNOSIS — F17210 Nicotine dependence, cigarettes, uncomplicated: Secondary | ICD-10-CM | POA: Diagnosis present

## 2022-08-20 DIAGNOSIS — I110 Hypertensive heart disease with heart failure: Secondary | ICD-10-CM | POA: Diagnosis present

## 2022-08-20 DIAGNOSIS — D509 Iron deficiency anemia, unspecified: Secondary | ICD-10-CM | POA: Diagnosis not present

## 2022-08-20 DIAGNOSIS — Z823 Family history of stroke: Secondary | ICD-10-CM

## 2022-08-20 DIAGNOSIS — Z9049 Acquired absence of other specified parts of digestive tract: Secondary | ICD-10-CM

## 2022-08-20 DIAGNOSIS — I249 Acute ischemic heart disease, unspecified: Secondary | ICD-10-CM | POA: Diagnosis not present

## 2022-08-20 DIAGNOSIS — Z87898 Personal history of other specified conditions: Secondary | ICD-10-CM

## 2022-08-20 DIAGNOSIS — E1165 Type 2 diabetes mellitus with hyperglycemia: Secondary | ICD-10-CM | POA: Diagnosis present

## 2022-08-20 DIAGNOSIS — Z923 Personal history of irradiation: Secondary | ICD-10-CM

## 2022-08-20 DIAGNOSIS — Z9851 Tubal ligation status: Secondary | ICD-10-CM

## 2022-08-20 DIAGNOSIS — Z87448 Personal history of other diseases of urinary system: Secondary | ICD-10-CM

## 2022-08-20 DIAGNOSIS — Z7982 Long term (current) use of aspirin: Secondary | ICD-10-CM

## 2022-08-20 DIAGNOSIS — Z811 Family history of alcohol abuse and dependence: Secondary | ICD-10-CM

## 2022-08-20 DIAGNOSIS — Z881 Allergy status to other antibiotic agents status: Secondary | ICD-10-CM

## 2022-08-20 DIAGNOSIS — R7989 Other specified abnormal findings of blood chemistry: Secondary | ICD-10-CM | POA: Diagnosis present

## 2022-08-20 DIAGNOSIS — I5032 Chronic diastolic (congestive) heart failure: Secondary | ICD-10-CM | POA: Diagnosis present

## 2022-08-20 DIAGNOSIS — I340 Nonrheumatic mitral (valve) insufficiency: Secondary | ICD-10-CM | POA: Diagnosis not present

## 2022-08-20 DIAGNOSIS — Z862 Personal history of diseases of the blood and blood-forming organs and certain disorders involving the immune mechanism: Secondary | ICD-10-CM

## 2022-08-20 DIAGNOSIS — J449 Chronic obstructive pulmonary disease, unspecified: Secondary | ICD-10-CM | POA: Diagnosis not present

## 2022-08-20 DIAGNOSIS — M160 Bilateral primary osteoarthritis of hip: Secondary | ICD-10-CM | POA: Diagnosis present

## 2022-08-20 DIAGNOSIS — R778 Other specified abnormalities of plasma proteins: Secondary | ICD-10-CM | POA: Diagnosis not present

## 2022-08-20 DIAGNOSIS — M810 Age-related osteoporosis without current pathological fracture: Secondary | ICD-10-CM | POA: Diagnosis present

## 2022-08-20 DIAGNOSIS — I252 Old myocardial infarction: Secondary | ICD-10-CM

## 2022-08-20 DIAGNOSIS — Z9841 Cataract extraction status, right eye: Secondary | ICD-10-CM

## 2022-08-20 DIAGNOSIS — G4733 Obstructive sleep apnea (adult) (pediatric): Secondary | ICD-10-CM | POA: Diagnosis not present

## 2022-08-20 DIAGNOSIS — Z9861 Coronary angioplasty status: Secondary | ICD-10-CM | POA: Diagnosis not present

## 2022-08-20 DIAGNOSIS — Z8709 Personal history of other diseases of the respiratory system: Secondary | ICD-10-CM

## 2022-08-20 DIAGNOSIS — Z9842 Cataract extraction status, left eye: Secondary | ICD-10-CM

## 2022-08-20 DIAGNOSIS — F419 Anxiety disorder, unspecified: Secondary | ICD-10-CM | POA: Diagnosis present

## 2022-08-20 DIAGNOSIS — Z955 Presence of coronary angioplasty implant and graft: Secondary | ICD-10-CM | POA: Diagnosis not present

## 2022-08-20 DIAGNOSIS — I422 Other hypertrophic cardiomyopathy: Secondary | ICD-10-CM | POA: Diagnosis not present

## 2022-08-20 DIAGNOSIS — R072 Precordial pain: Secondary | ICD-10-CM | POA: Diagnosis present

## 2022-08-20 DIAGNOSIS — Z7901 Long term (current) use of anticoagulants: Secondary | ICD-10-CM

## 2022-08-20 DIAGNOSIS — Z87440 Personal history of urinary (tract) infections: Secondary | ICD-10-CM

## 2022-08-20 DIAGNOSIS — E785 Hyperlipidemia, unspecified: Secondary | ICD-10-CM | POA: Diagnosis not present

## 2022-08-20 DIAGNOSIS — Z98891 History of uterine scar from previous surgery: Secondary | ICD-10-CM

## 2022-08-20 DIAGNOSIS — Z8701 Personal history of pneumonia (recurrent): Secondary | ICD-10-CM

## 2022-08-20 DIAGNOSIS — I2489 Other forms of acute ischemic heart disease: Secondary | ICD-10-CM | POA: Diagnosis present

## 2022-08-20 DIAGNOSIS — I25110 Atherosclerotic heart disease of native coronary artery with unstable angina pectoris: Secondary | ICD-10-CM | POA: Diagnosis not present

## 2022-08-20 DIAGNOSIS — I5031 Acute diastolic (congestive) heart failure: Secondary | ICD-10-CM | POA: Diagnosis not present

## 2022-08-20 DIAGNOSIS — Z803 Family history of malignant neoplasm of breast: Secondary | ICD-10-CM

## 2022-08-20 DIAGNOSIS — Z818 Family history of other mental and behavioral disorders: Secondary | ICD-10-CM

## 2022-08-20 DIAGNOSIS — Z86010 Personal history of colonic polyps: Secondary | ICD-10-CM

## 2022-08-20 DIAGNOSIS — Z8781 Personal history of (healed) traumatic fracture: Secondary | ICD-10-CM

## 2022-08-20 DIAGNOSIS — R0789 Other chest pain: Secondary | ICD-10-CM | POA: Diagnosis not present

## 2022-08-20 LAB — BASIC METABOLIC PANEL
Anion gap: 9 (ref 5–15)
BUN: 17 mg/dL (ref 8–23)
CO2: 27 mmol/L (ref 22–32)
Calcium: 9.3 mg/dL (ref 8.9–10.3)
Chloride: 106 mmol/L (ref 98–111)
Creatinine, Ser: 0.86 mg/dL (ref 0.44–1.00)
GFR, Estimated: >60 mL/min (ref >60)
Glucose, Bld: 120 mg/dL — ABNORMAL HIGH (ref 70–99)
Potassium: 4 mmol/L (ref 3.5–5.1)
Sodium: 142 mmol/L (ref 135–145)

## 2022-08-20 LAB — URINALYSIS, ROUTINE W REFLEX MICROSCOPIC
Bilirubin Urine: NEGATIVE
Glucose, UA: NEGATIVE mg/dL
Hgb urine dipstick: NEGATIVE
Ketones, ur: NEGATIVE mg/dL
Nitrite: NEGATIVE
Protein, ur: 30 mg/dL — AB
Specific Gravity, Urine: 1.036 — ABNORMAL HIGH (ref 1.005–1.030)
pH: 5 (ref 5.0–8.0)

## 2022-08-20 LAB — CBC
HCT: 36.8 % (ref 36.0–46.0)
Hemoglobin: 10.8 g/dL — ABNORMAL LOW (ref 12.0–15.0)
MCH: 25 pg — ABNORMAL LOW (ref 26.0–34.0)
MCHC: 29.3 g/dL — ABNORMAL LOW (ref 30.0–36.0)
MCV: 85.2 fL (ref 80.0–100.0)
Platelets: 281 10*3/uL (ref 150–400)
RBC: 4.32 MIL/uL (ref 3.87–5.11)
RDW: 17.3 % — ABNORMAL HIGH (ref 11.5–15.5)
WBC: 10.7 10*3/uL — ABNORMAL HIGH (ref 4.0–10.5)
nRBC: 0 % (ref 0.0–0.2)

## 2022-08-20 LAB — TROPONIN I (HIGH SENSITIVITY)
Troponin I (High Sensitivity): 19 ng/L — ABNORMAL HIGH (ref <18)
Troponin I (High Sensitivity): 51 ng/L — ABNORMAL HIGH (ref <18)

## 2022-08-20 LAB — PROTIME-INR
INR: 1.2 (ref 0.8–1.2)
Prothrombin Time: 15.1 seconds (ref 11.4–15.2)

## 2022-08-20 MED ORDER — AMIODARONE HCL 100 MG PO TABS
100.0000 mg | ORAL_TABLET | Freq: Every day | ORAL | Status: DC
  Administered 2022-08-21 – 2022-08-23 (×3): 100 mg via ORAL
  Filled 2022-08-20 (×3): qty 1

## 2022-08-20 MED ORDER — METOPROLOL TARTRATE 12.5 MG HALF TABLET
12.5000 mg | ORAL_TABLET | Freq: Two times a day (BID) | ORAL | Status: DC
  Administered 2022-08-20 – 2022-08-23 (×6): 12.5 mg via ORAL
  Filled 2022-08-20 (×6): qty 1

## 2022-08-20 MED ORDER — ONDANSETRON HCL 4 MG/2ML IJ SOLN: 4.0000 mg | Freq: Four times a day (QID) | INTRAMUSCULAR | Status: DC | PRN

## 2022-08-20 MED ORDER — BUSPIRONE HCL 15 MG PO TABS
15.0000 mg | ORAL_TABLET | Freq: Three times a day (TID) | ORAL | Status: DC
  Administered 2022-08-21 – 2022-08-23 (×6): 15 mg via ORAL
  Filled 2022-08-20 (×6): qty 1

## 2022-08-20 MED ORDER — ASPIRIN 81 MG PO TBEC
81.0000 mg | DELAYED_RELEASE_TABLET | Freq: Every day | ORAL | Status: DC
  Administered 2022-08-21 – 2022-08-22 (×2): 81 mg via ORAL
  Filled 2022-08-20 (×2): qty 1

## 2022-08-20 MED ORDER — ACETAMINOPHEN 325 MG PO TABS
650.0000 mg | ORAL_TABLET | ORAL | Status: DC | PRN
  Administered 2022-08-21 – 2022-08-23 (×3): 650 mg via ORAL
  Filled 2022-08-20 (×3): qty 2

## 2022-08-20 MED ORDER — SODIUM CHLORIDE 0.9 % IV BOLUS
500.0000 mL | Freq: Once | INTRAVENOUS | Status: AC
  Administered 2022-08-20: 500 mL via INTRAVENOUS

## 2022-08-20 MED ORDER — PREGABALIN 75 MG PO CAPS
75.0000 mg | ORAL_CAPSULE | Freq: Two times a day (BID) | ORAL | Status: DC
  Administered 2022-08-20 – 2022-08-23 (×5): 75 mg via ORAL
  Filled 2022-08-20 (×5): qty 1

## 2022-08-20 MED ORDER — PANTOPRAZOLE SODIUM 40 MG PO TBEC
40.0000 mg | DELAYED_RELEASE_TABLET | Freq: Every day | ORAL | Status: DC
  Administered 2022-08-21 – 2022-08-23 (×3): 40 mg via ORAL
  Filled 2022-08-20 (×3): qty 1

## 2022-08-20 MED ORDER — AMLODIPINE BESYLATE 5 MG PO TABS
5.0000 mg | ORAL_TABLET | Freq: Every day | ORAL | Status: DC
  Administered 2022-08-21 – 2022-08-23 (×3): 5 mg via ORAL
  Filled 2022-08-20 (×3): qty 1

## 2022-08-20 MED ORDER — ONDANSETRON HCL 4 MG/2ML IJ SOLN
4.0000 mg | Freq: Once | INTRAMUSCULAR | Status: AC
  Administered 2022-08-20: 4 mg via INTRAVENOUS
  Filled 2022-08-20: qty 2

## 2022-08-20 MED ORDER — ALBUTEROL SULFATE (2.5 MG/3ML) 0.083% IN NEBU: 2.5000 mg | INHALATION_SOLUTION | RESPIRATORY_TRACT | Status: DC | PRN

## 2022-08-20 MED ORDER — ADULT MULTIVITAMIN W/MINERALS CH
1.0000 | ORAL_TABLET | Freq: Every day | ORAL | Status: DC
  Administered 2022-08-21 – 2022-08-23 (×3): 1 via ORAL
  Filled 2022-08-20 (×3): qty 1

## 2022-08-20 MED ORDER — INSULIN ASPART 100 UNIT/ML IJ SOLN
0.0000 [IU] | Freq: Three times a day (TID) | INTRAMUSCULAR | Status: DC
  Administered 2022-08-21 – 2022-08-23 (×4): 3 [IU] via SUBCUTANEOUS

## 2022-08-20 MED ORDER — ETOMIDATE 2 MG/ML IV SOLN
8.0000 mg | Freq: Once | INTRAVENOUS | Status: DC
  Filled 2022-08-20: qty 10

## 2022-08-20 MED ORDER — HEPARIN (PORCINE) 25000 UT/250ML-% IV SOLN
1400.0000 [IU]/h | INTRAVENOUS | Status: DC
  Administered 2022-08-21 (×2): 1400 [IU]/h via INTRAVENOUS
  Filled 2022-08-20 (×2): qty 250

## 2022-08-20 MED ORDER — DULOXETINE HCL 60 MG PO CPEP
60.0000 mg | ORAL_CAPSULE | Freq: Every day | ORAL | Status: DC
  Administered 2022-08-21 – 2022-08-23 (×3): 60 mg via ORAL
  Filled 2022-08-20 (×3): qty 1

## 2022-08-20 MED ORDER — ASPIRIN 81 MG PO CHEW
324.0000 mg | CHEWABLE_TABLET | ORAL | Status: AC
  Administered 2022-08-20: 324 mg via ORAL
  Filled 2022-08-20: qty 4

## 2022-08-20 MED ORDER — ETOMIDATE 2 MG/ML IV SOLN
INTRAVENOUS | Status: AC | PRN
  Administered 2022-08-20: 8 mg via INTRAVENOUS

## 2022-08-20 MED ORDER — ROSUVASTATIN CALCIUM 20 MG PO TABS
20.0000 mg | ORAL_TABLET | Freq: Every day | ORAL | Status: DC
  Administered 2022-08-21 – 2022-08-23 (×3): 20 mg via ORAL
  Filled 2022-08-20 (×3): qty 1

## 2022-08-20 MED ORDER — ASPIRIN 300 MG RE SUPP: 300.0000 mg | RECTAL | Status: AC

## 2022-08-20 MED ORDER — NITROGLYCERIN 0.4 MG SL SUBL
0.4000 mg | SUBLINGUAL_TABLET | SUBLINGUAL | Status: DC | PRN
  Administered 2022-08-21 – 2022-08-22 (×2): 0.4 mg via SUBLINGUAL
  Filled 2022-08-20 (×3): qty 1

## 2022-08-20 NOTE — ED Notes (Signed)
Unable to assess pain due to sedation.  Patient unresponsive.  Patient cardioverted with 200 J by Dr. Tamera Punt.  Converted to sinus rhythm

## 2022-08-20 NOTE — ED Notes (Signed)
ED Provider at bedside. 
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
## 2022-08-20 NOTE — ED Notes (Signed)
Pt transferred by care link to [facility].

## 2022-08-20 NOTE — ED Triage Notes (Signed)
Pt c/o left sided chest tightness and pain in the left arm since 10a. Denies any new SHOB. Endorses some nausea.
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
## 2022-08-20 NOTE — ED Provider Notes (Signed)
[facility] DE[facility] Provider Note   CSN: [id number] Arrival date & time: 08/20/22  1533     History  Chief Complaint  Patient presents with   Chest Pain    Martha Mora is a 76 y.o. female.  Patient is a 76 year old female with a history of hypertension, A-fib on Eliquis, mitral valve regurgitation, CHF, prior coronary artery disease status post multiple stent placements.  She presents with chest pain.  She said she is having some tightness in the left side of her chest going to her left arm about 10:00 this morning.  Its been fairly constant since then.  She denies any shortness of breath other than her baseline shortness of breath.  She has had some nausea.  Occasional dizziness.  She checked her heart rate with her pulse ox monitor and noted that it was very high although she does not feel like it is racing.  She has a history of A-fib that was diagnosed during a hospitalization and was started on Eliquis.  She only complains of some minimal chest tightness currently.  She does state that her chest pain feels similar to her prior anginal symptoms.  She says that she is compliant with her Eliquis.  She has not missed any doses.  She did take it this morning.      Home Medications Prior to Admission medications   Medication Sig Start Date End Date Taking? Authorizing Provider  acetaminophen (TYLENOL) 325 MG tablet Take 2 tablets (650 mg total) by mouth every 6 (six) hours as needed for mild pain, fever or headache. 04/08/22   Dameron, Luna Fuse, DO  albuterol (VENTOLIN HFA) 108 (90 Base) MCG/ACT inhaler TAKE 1-2 INHALATIONS EVERY 4-6 HOURS AS NEEDED FOR WHEEZING. DISPENSE SPACER AS NEEDED. 06/19/22   Gerrit Heck, MD  amiodarone (PACERONE) 200 MG tablet Take 1/2 tablet (100 mg total) by mouth daily. Schedule an appointment with your cardiologist prior to next refill. 03/04/22   Brimage, Ronnette Juniper, DO  amLODipine (NORVASC) 5 MG tablet Take 5 mg by mouth daily. 03/01/22    [provider]  apixaban (ELIQUIS) 5 MG TABS tablet Take 1 tablet (5 mg total) by mouth 2 (two) times daily. 08/09/22   Gerrit Heck, MD  busPIRone (BUSPAR) 15 MG tablet Take 1 tablet (15 mg total) by mouth 3 (three) times daily. 03/15/22   [name], DO  Calcium Carb-Cholecalciferol 600-800 MG-UNIT TABS Take 1 tablet by mouth 2 (two) times a day.    [provider]  DULoxetine (CYMBALTA) 60 MG capsule TAKE 1 CAPSULE BY MOUTH EVERY DAY 06/03/22   Gerrit Heck, MD  Ferrous Sulfate (IRON) 325 (65 Fe) MG TABS Take 1 tablet (325 mg total) by mouth every other day. 07/26/22   Gerrit Heck, MD  ketorolac (ACULAR) 0.5 % ophthalmic solution Place 1 drop into both eyes See admin instructions. Four times a day for 2 days following eye injection. Injection every 5 weeks 12/03/21   [provider]  loratadine (CLARITIN) 10 MG tablet Take 1 tablet (10 mg total) by mouth daily. 03/11/22   Brimage, Ronnette Juniper, DO  metoprolol tartrate (LOPRESSOR) 25 MG tablet TAKE 0.5 TABLETS BY MOUTH 2 TIMES DAILY. Patient taking differently: Take 12.5 mg by mouth 2 (two) times daily. 01/22/22   [name], DO  Multiple Vitamin (MULTIVITAMIN) tablet Take 1 tablet by mouth daily. Herbal Life multivitamin    [provider]  Multiple Vitamins-Minerals (PRESERVISION AREDS 2 PO) Take 1 tablet by mouth daily.  [provider]  omeprazole (PRILOSEC) 40 MG capsule TAKE 1 CAPSULE BY MOUTH  DAILY 05/27/22   Gerrit Heck, MD  pregabalin (LYRICA) 75 MG capsule Take 1 capsule (75 mg total) by mouth 2 (two) times daily. 07/29/22 08/28/22  Gerrit Heck, MD  PROLIA 60 MG/ML SOSY injection Inject 60 mg into the skin every 6 (six) months.  08/18/18   [provider]  rosuvastatin (CRESTOR) 20 MG tablet Take 20 mg by mouth daily.  03/15/19   [provider]  saline (AYR) GEL Place 1 application. into both nostrils every 4 (four) hours as needed (Dryness, irritation).  04/08/22   Dameron, Luna Fuse, DO  sodium chloride (OCEAN) 0.65 % SOLN nasal spray Place 2 sprays into both nostrils as needed for congestion. 04/08/22   Dameron, Luna Fuse, DO  traZODone (DESYREL) 50 MG tablet TAKE 1 TABLET BY MOUTH EVERYDAY AT BEDTIME Patient not taking: Reported on 07/23/2022 06/25/22   Gerrit Heck, MD  Vitamin D, Ergocalciferol, (DRISDOL) 1.25 MG (50000 UNIT) CAPS capsule TAKE 1 CAPSULE BY MOUTH ONE TIME PER WEEK 06/24/22   Truitt Merle, MD      Allergies    Keflex [cephalexin], Penicillins, Ropinirole hcl, and Shellfish allergy    Review of Systems   Review of Systems  Constitutional:  Positive for fatigue. Negative for chills, diaphoresis and fever.  HENT:  Negative for congestion, rhinorrhea and sneezing.   Eyes: Negative.   Respiratory:  Positive for chest tightness. Negative for cough and shortness of breath.   Cardiovascular:  Negative for chest pain and leg swelling.  Gastrointestinal:  Positive for nausea. Negative for abdominal pain, blood in stool, diarrhea and vomiting.  Genitourinary:  Negative for difficulty urinating, flank pain, frequency and hematuria.  Musculoskeletal:  Negative for arthralgias and back pain.  Skin:  Negative for rash.  Neurological:  Positive for light-headedness. Negative for dizziness, speech difficulty, weakness, numbness and headaches.    Physical Exam Updated Vital Signs BP 138/83   Pulse 81   Temp 98.4 F (36.9 C)   Resp 18   Ht '5\' 1"'$  (1.549 m)   Wt 104.3 kg   SpO2 92%   BMI 43.46 kg/m  Physical Exam Constitutional:      Appearance: She is well-developed.  HENT:     Head: Normocephalic and atraumatic.  Eyes:     Pupils: Pupils are equal, round, and reactive to light.  Cardiovascular:     Rate and Rhythm: Regular rhythm. Tachycardia present.     Heart sounds: Normal heart sounds.  Pulmonary:     Effort: Pulmonary effort is normal. No respiratory distress.     Breath sounds: Normal breath sounds. No wheezing or rales.   Chest:     Chest wall: No tenderness.  Abdominal:     General: Bowel sounds are normal.     Palpations: Abdomen is soft.     Tenderness: There is no abdominal tenderness. There is no guarding or rebound.  Musculoskeletal:        General: Normal range of motion.     Cervical back: Normal range of motion and neck supple.     Comments: Trace edema to lower extremities bilaterally  Lymphadenopathy:     Cervical: No cervical adenopathy.  Skin:    General: Skin is warm and dry.     Findings: No rash.  Neurological:     Mental Status: She is alert and oriented to person, place, and time.    ED Results / Procedures / Treatments  Labs (all labs ordered are listed, but only abnormal results are displayed) Labs Reviewed  BASIC METABOLIC PANEL - Abnormal; Notable for the following components:      Result Value   Glucose, Bld 120 (*)    All other components within normal limits  CBC - Abnormal; Notable for the following components:   WBC 10.7 (*)    Hemoglobin 10.8 (*)    MCH 25.0 (*)    MCHC 29.3 (*)    RDW 17.3 (*)    All other components within normal limits  TROPONIN I (HIGH SENSITIVITY) - Abnormal; Notable for the following components:   Troponin I (High Sensitivity) 19 (*)    All other components within normal limits  TROPONIN I (HIGH SENSITIVITY) - Abnormal; Notable for the following components:   Troponin I (High Sensitivity) 51 (*)    All other components within normal limits  PROTIME-INR  URINALYSIS, ROUTINE W REFLEX MICROSCOPIC    EKG EKG Interpretation  Date/Time:  Tuesday August 20 2022 15:48:21 EDT Ventricular Rate:  161 PR Interval:    QRS Duration: 84 QT Interval:  290 QTC Calculation: 474 R Axis:   12 Text Interpretation: Atrial fibrillation with rapid ventricular response Abnormal ECG When compared with ECG of 30-Mar-2022 11:20, Atrial fibrillation has replaced Sinus rhythm Vent. rate has increased BY  99 BPM T wave inversion now evident in Inferior  leads T wave amplitude has increased in Lateral leads Confirmed by Malvin Johns [phone]) on 08/20/2022 4:36:53 PM  Radiology DG Chest Port 1 View  Result Date: 08/20/2022 CLINICAL DATA:  Chest tightness and arm pain. EXAM: PORTABLE CHEST 1 VIEW COMPARISON:  07/23/2022 FINDINGS: Poor inspiration. Heart size upper limits of normal. Chronic aortic atherosclerosis. Chronic elevated right hemidiaphragm. Upper lungs are clear. IMPRESSION: Poor inspiration. No active disease suspected. Electronically Signed   By: Nelson Chimes M.D.   On: 08/20/2022 16:21    Procedures .Cardioversion  Date/Time: 08/20/2022 5:18 PM  Performed by: Malvin Johns, MD Authorized by: Malvin Johns, MD   Consent:    Consent obtained:  Written   Consent given by:  Patient   Risks discussed:  Induced arrhythmia, pain and death   Alternatives discussed:  No treatment Pre-procedure details:    Cardioversion basis:  Emergent   Rhythm:  Atrial fibrillation   Electrode placement:  Anterior-lateral Patient sedated: Yes. Refer to sedation procedure documentation for details of sedation.  Attempt one:    Cardioversion mode:  Synchronous   Waveform:  Biphasic   Shock (Joules):  200   Shock outcome:  Conversion to normal sinus rhythm Post-procedure details:    Patient status:  Awake   Patient tolerance of procedure:  Tolerated well, no immediate complications .Sedation  Date/Time: 08/20/2022 5:19 PM  Performed by: Malvin Johns, MD Authorized by: Malvin Johns, MD   Consent:    Consent obtained:  Written   Consent given by:  Patient   Risks discussed:  Prolonged hypoxia resulting in organ damage, inadequate sedation, respiratory compromise necessitating ventilatory assistance and intubation, vomiting and dysrhythmia Universal protocol:    Immediately prior to procedure, a time out was called: yes     Patient identity confirmed:  Verbally with patient Indications:    Procedure performed:  Cardioversion    Procedure necessitating sedation performed by:  Physician performing sedation Pre-sedation assessment:    Time since last food or drink:  Unknown   NPO status caution: unable to specify NPO status and urgency dictates proceeding with non-ideal NPO status  ASA classification: class 3 - patient with severe systemic disease     Mouth opening:  3 or more finger widths   Thyromental distance:  3 finger widths   Mallampati score:  II - soft palate, uvula, fauces visible   Neck mobility: normal     Pre-sedation assessments completed and reviewed: airway patency, cardiovascular function, hydration status, mental status, nausea/vomiting, pain level, respiratory function and temperature     Pre-sedation assessment completed:  08/20/2022 4:45 PM Immediate pre-procedure details:    Reassessment: Patient reassessed immediately prior to procedure     Reviewed: vital signs     Verified: bag valve mask available, emergency equipment available, intubation equipment available, IV patency confirmed and oxygen available   Procedure details (see MAR for exact dosages):    Preoxygenation:  Nasal cannula   Sedation:  Etomidate   Intended level of sedation: deep   Analgesia:  None   Intra-procedure monitoring:  Blood pressure monitoring, cardiac monitor, continuous capnometry, continuous pulse oximetry, frequent LOC assessments and frequent vital sign checks   Intra-procedure events: none     Intra-procedure management:  Supplemental oxygen   Total Provider sedation time (minutes):  15 Post-procedure details:    Post-sedation assessment completed:  08/20/2022 5:21 PM   Attendance: Constant attendance by certified staff until patient recovered     Recovery: Patient returned to pre-procedure baseline     Post-sedation assessments completed and reviewed: airway patency, cardiovascular function, hydration status, mental status, nausea/vomiting, pain level, respiratory function and temperature     Patient is stable  for discharge or admission: yes     Procedure completion:  Tolerated well, no immediate complications     Medications Ordered in ED Medications  etomidate (AMIDATE) injection 8 mg (has no administration in time range)  sodium chloride 0.9 % bolus 500 mL (0 mLs Intravenous Stopped 08/20/22 1906)  ondansetron (ZOFRAN) injection 4 mg (4 mg Intravenous Given 08/20/22 1706)  etomidate (AMIDATE) injection (8 mg Intravenous Given 08/20/22 1656)  sodium chloride 0.9 % bolus 500 mL (0 mLs Intravenous Stopped 08/20/22 1906)    ED Course/ Medical Decision Making/ A&P                           Medical Decision Making Amount and/or Complexity of Data Reviewed Labs: ordered. Radiology: ordered.  Risk Prescription drug management. Decision regarding hospitalization.   Patient is a 76 year old female who presents with tachycardia.  She is noted to be in A-fib with RVR and has associated chest discomfort.  Her blood pressure was low on arrival in the 29J and 24Q systolic.  It had improved into the 90s but given her soft pressures, I felt that it was better to do an emergent cardioversion rather than try medications.  She is compliant with her Eliquis.  She was cardioverted successfully with 1 shock into a sinus rhythm.  She is having some frequent PACs.  Her chest discomfort has resolved.  Her first troponin that was done prior to the cardioversion was mildly elevated.  Her second troponin is slightly more elevated.  Chest x-ray is clear.  There is no evidence of pulmonary edema or pneumonia.  This was interpreted by me and confirmed by the radiologist.  Her other labs are nonconcerning.  I spoke with Dr. [name] who is her cardiologist.  He recommends admission for observation.  She did take her Eliquis this morning so was not started on heparin at this point.  CRITICAL CARE  Performed by: Malvin Johns Total critical care time: 70 minutes Critical care time was exclusive of separately billable procedures  and treating other patients. Critical care was necessary to treat or prevent imminent or life-threatening deterioration. Critical care was time spent personally by me on the following activities: development of treatment plan with patient and/or surrogate as well as nursing, discussions with consultants, evaluation of patient's response to treatment, examination of patient, obtaining history from patient or surrogate, ordering and performing treatments and interventions, ordering and review of laboratory studies, ordering and review of radiographic studies, pulse oximetry and re-evaluation of patient's condition.   Final Clinical Impression(s) / ED Diagnoses Final diagnoses:  Atrial fibrillation with RVR ([place])  Elevated troponin    Rx / DC Orders ED Discharge Orders     None         Malvin Johns, MD 08/20/22 1956

## 2022-08-20 NOTE — ED Notes (Signed)
Patient placed on ETCO2 for procedural sedation; ETCO2 36-40 mm Hg throughout. Patient assisted w/ blowby O2, but able to maintain airway. Airway equipment a bedside and suction setup at bedside. Patient alert at this time.

## 2022-08-21 ENCOUNTER — Other Ambulatory Visit: Payer: Self-pay

## 2022-08-21 ENCOUNTER — Inpatient Hospital Stay (HOSPITAL_COMMUNITY): Payer: Medicare Other

## 2022-08-21 LAB — ECHOCARDIOGRAM COMPLETE
AR max vel: 2.9 cm2
AV Area VTI: 3.15 cm2
AV Area mean vel: 2.8 cm2
AV Mean grad: 11.5 mmHg
AV Peak grad: 19.6 mmHg
Ao pk vel: 2.22 m/s
Area-P 1/2: 2.94 cm2
Height: 61 in
S' Lateral: 3.4 cm
Weight: 3791.91 oz

## 2022-08-21 LAB — LIPID PANEL
Cholesterol: 132 mg/dL (ref 0–200)
HDL: 37 mg/dL — ABNORMAL LOW (ref >40)
LDL Cholesterol: 45 mg/dL (ref 0–99)
Total CHOL/HDL Ratio: 3.6 RATIO
Triglycerides: 249 mg/dL — ABNORMAL HIGH (ref <150)
VLDL: 50 mg/dL — ABNORMAL HIGH (ref 0–40)

## 2022-08-21 LAB — GLUCOSE, CAPILLARY
Glucose-Capillary: 140 mg/dL — ABNORMAL HIGH (ref 70–99)
Glucose-Capillary: 120 mg/dL — ABNORMAL HIGH (ref 70–99)
Glucose-Capillary: 110 mg/dL — ABNORMAL HIGH (ref 70–99)
Glucose-Capillary: 137 mg/dL — ABNORMAL HIGH (ref 70–99)

## 2022-08-21 LAB — BASIC METABOLIC PANEL
Anion gap: 7 (ref 5–15)
BUN: 16 mg/dL (ref 8–23)
CO2: 26 mmol/L (ref 22–32)
Calcium: 8.8 mg/dL — ABNORMAL LOW (ref 8.9–10.3)
Chloride: 109 mmol/L (ref 98–111)
Creatinine, Ser: 0.81 mg/dL (ref 0.44–1.00)
GFR, Estimated: >60 mL/min (ref >60)
Glucose, Bld: 131 mg/dL — ABNORMAL HIGH (ref 70–99)
Potassium: 4 mmol/L (ref 3.5–5.1)
Sodium: 142 mmol/L (ref 135–145)

## 2022-08-21 LAB — CBC
HCT: 36.3 % (ref 36.0–46.0)
Hemoglobin: 10.2 g/dL — ABNORMAL LOW (ref 12.0–15.0)
MCH: 24.9 pg — ABNORMAL LOW (ref 26.0–34.0)
MCHC: 28.1 g/dL — ABNORMAL LOW (ref 30.0–36.0)
MCV: 88.5 fL (ref 80.0–100.0)
Platelets: 250 10*3/uL (ref 150–400)
RBC: 4.1 MIL/uL (ref 3.87–5.11)
RDW: 17.3 % — ABNORMAL HIGH (ref 11.5–15.5)
WBC: 11.1 10*3/uL — ABNORMAL HIGH (ref 4.0–10.5)
nRBC: 0 % (ref 0.0–0.2)

## 2022-08-21 LAB — APTT
aPTT: 83 seconds — ABNORMAL HIGH (ref 24–36)
aPTT: 72 seconds — ABNORMAL HIGH (ref 24–36)

## 2022-08-21 LAB — HEPARIN LEVEL (UNFRACTIONATED): Heparin Unfractionated: 1.08 IU/mL — ABNORMAL HIGH (ref 0.30–0.70)

## 2022-08-21 LAB — TROPONIN I (HIGH SENSITIVITY)
Troponin I (High Sensitivity): 144 ng/L (ref <18)
Troponin I (High Sensitivity): 142 ng/L (ref <18)

## 2022-08-21 LAB — MRSA NEXT GEN BY PCR, NASAL: MRSA by PCR Next Gen: DETECTED — AB

## 2022-08-21 LAB — TSH: TSH: 1.893 u[IU]/mL (ref 0.350–4.500)

## 2022-08-21 LAB — HEMOGLOBIN A1C
Hgb A1c MFr Bld: 6.9 % — ABNORMAL HIGH (ref 4.8–5.6)
Mean Plasma Glucose: 151.33 mg/dL

## 2022-08-21 MED ORDER — SODIUM CHLORIDE 0.9% FLUSH
3.0000 mL | Freq: Two times a day (BID) | INTRAVENOUS | Status: DC
  Administered 2022-08-22 (×2): 3 mL via INTRAVENOUS

## 2022-08-21 MED ORDER — CHLORHEXIDINE GLUCONATE CLOTH 2 % EX PADS
6.0000 | MEDICATED_PAD | Freq: Every day | CUTANEOUS | Status: DC
  Administered 2022-08-22 – 2022-08-23 (×2): 6 via TOPICAL

## 2022-08-21 MED ORDER — SALINE SPRAY 0.65 % NA SOLN
1.0000 | NASAL | Status: DC | PRN
  Filled 2022-08-21: qty 44

## 2022-08-21 MED ORDER — ORAL CARE MOUTH RINSE: 15.0000 mL | OROMUCOSAL | Status: DC | PRN

## 2022-08-21 MED ORDER — MUPIROCIN 2 % EX OINT
1.0000 | TOPICAL_OINTMENT | Freq: Two times a day (BID) | CUTANEOUS | Status: DC
  Administered 2022-08-21 – 2022-08-22 (×4): 1 via NASAL
  Filled 2022-08-21: qty 22

## 2022-08-21 NOTE — Plan of Care (Signed)
Initiation of Care Plan Problem: Education: Goal: Knowledge of General Education information will improve Description: Including pain rating scale, medication(s)/side effects and non-pharmacologic comfort measures Outcome: Progressing   Problem: Health Behavior/Discharge Planning: Goal: Ability to manage health-related needs will improve Outcome: Progressing   Problem: Clinical Measurements: Goal: Ability to maintain clinical measurements within normal limits will improve Outcome: Progressing Goal: Will remain free from infection Outcome: Progressing Goal: Diagnostic test results will improve Outcome: Progressing Goal: Respiratory complications will improve Outcome: Progressing Goal: Cardiovascular complication will be avoided Outcome: Progressing   Problem: Activity: Goal: Risk for activity intolerance will decrease Outcome: Progressing   Problem: Nutrition: Goal: Adequate nutrition will be maintained Outcome: Progressing   Problem: Coping: Goal: Level of anxiety will decrease Outcome: Progressing   Problem: Elimination: Goal: Will not experience complications related to bowel motility Outcome: Progressing Goal: Will not experience complications related to urinary retention Outcome: Progressing   Problem: Pain Managment: Goal: General experience of comfort will improve Outcome: Progressing   Problem: Safety: Goal: Ability to remain free from injury will improve Outcome: Progressing   Problem: Skin Integrity: Goal: Risk for impaired skin integrity will decrease Outcome: Progressing   Problem: Education: Goal: Understanding of cardiac disease, CV risk reduction, and recovery process will improve Outcome: Progressing Goal: Individualized Educational Video(s) Outcome: Progressing   Problem: Activity: Goal: Ability to tolerate increased activity will improve Outcome: Progressing   Problem: Cardiac: Goal: Ability to achieve and maintain adequate  cardiovascular perfusion will improve Outcome: Progressing   Problem: Health Behavior/Discharge Planning: Goal: Ability to safely manage health-related needs after discharge will improve Outcome: Progressing   Problem: Education: Goal: Ability to describe self-care measures that may prevent or decrease complications (Diabetes Survival Skills Education) will improve Outcome: Progressing Goal: Individualized Educational Video(s) Outcome: Progressing   Problem: Coping: Goal: Ability to adjust to condition or change in health will improve Outcome: Progressing   Problem: Fluid Volume: Goal: Ability to maintain a balanced intake and output will improve Outcome: Progressing   Problem: Health Behavior/Discharge Planning: Goal: Ability to identify and utilize available resources and services will improve Outcome: Progressing Goal: Ability to manage health-related needs will improve Outcome: Progressing   Problem: Metabolic: Goal: Ability to maintain appropriate glucose levels will improve Outcome: Progressing   Problem: Nutritional: Goal: Maintenance of adequate nutrition will improve Outcome: Progressing Goal: Progress toward achieving an optimal weight will improve Outcome: Progressing   Problem: Skin Integrity: Goal: Risk for impaired skin integrity will decrease Outcome: Progressing   Problem: Tissue Perfusion: Goal: Adequacy of tissue perfusion will improve Outcome: Progressing

## 2022-08-21 NOTE — Progress Notes (Signed)
[place] for heparin Indication: chest pain/ACS and atrial fibrillation  Allergies  Allergen Reactions   Keflex [Cephalexin] Other (See Comments)    "Made me feel funny"  Tolerated Rocephin in April 2020 over several days and 07/01/19 in ED.   Penicillins Rash and Other (See Comments)    03/01/19 tolerated Zosyn Red bumps all over stomach Did it involve swelling of the face/tongue/throat, SOB, or low BP? No Did it involve sudden or severe rash/hives, skin peeling, or any reaction on the inside of your mouth or nose? Yes Did you need to seek medical attention at a hospital or doctor's office? Yes When did it last happen?      30+ years  If all above answers are "NO", may proceed with cephalosporin use.   Requip Xl [Ropinirole Hcl] Other (See Comments)    Akathisia     Shellfish Allergy Nausea And Vomiting    Patient Measurements: Height: '5\' 1"'$  (154.9 cm) Weight: 107.5 kg (236 lb 15.9 oz) IBW/kg (Calculated) : 47.8 Heparin Dosing Weight: 75kg  Vital Signs: Temp: 98.7 F (37.1 C) (10/04 1712) Temp Source: Oral (10/04 1712) BP: 136/45 (10/04 1712) Pulse Rate: 67 (10/04 1216)  Labs: Recent Labs    08/20/22 1612 08/20/22 1615 08/20/22 1746 08/21/22 0020 08/21/22 0717 08/21/22 0811 08/21/22 0909 08/21/22 1807  HGB 10.8*  --   --  10.2*  --   --   --   --   HCT 36.8  --   --  36.3  --   --   --   --   PLT 281  --   --  250  --   --   --   --   APTT  --   --   --   --   --  83*  --  72*  LABPROT  --  15.1  --   --   --   --   --   --   INR  --  1.2  --   --   --   --   --   --   HEPARINUNFRC  --   --   --   --   --  1.08*  --   --   CREATININE 0.86  --   --  0.81  --   --   --   --   TROPONINIHS 19*  --  51*  --  144*  --  142*  --      Estimated Creatinine Clearance: 64.8 mL/min (by C-G formula based on SCr of 0.81 mg/dL).    Assessment: 76yo female c/o left-sided chest tightness with pain to the LUE and associated with  nausea, initial troponin mildly elevated and trending up, to transition from Eliquis (for Afib) to heparin. The last dose of  Eliquis was given the morning of 10/3. Plans are for possible cardiac cath -aPTT at goal on 1500 units/hr -heparin level elevated from recent apixaban -CBC stable  -confirm aPTT at goal, RN did report nose bleed likely due to oxygen  Goal of Therapy:  Heparin level 0.3-0.7 units/ml aPTT 66-102 seconds Monitor platelets by anticoagulation protocol: Yes   Plan:  -Continue heparin at 1500 units/hr -Daily aPTT, heparin level and CBC  Thank you Anette Guarneri, PharmD **Pharmacist phone directory can now be found on [email or web address] ([email or web address]).  Listed under [place].

## 2022-08-21 NOTE — H&P (View-Only) (Signed)
Ref: Gerrit Heck, MD   Subjective:  No chest pain. VS stable. Troponin I is increasing. Monitor shows sinus rhythm.  Objective:  Vital Signs in the last 24 hours: Temp:  [98 F (36.7 C)-99.1 F (37.3 C)] 98.4 F (36.9 C) (10/04 0700) Pulse Rate:  [63-143] 72 (10/04 0700) Cardiac Rhythm: Normal sinus rhythm (10/04 0802) Resp:  [14-39] 20 (10/04 0700) BP: (84-156)/(45-110) 125/57 (10/04 0700) SpO2:  [92 %-99 %] 92 % (10/04 0700) Weight:  [104.3 kg-107.5 kg] 107.5 kg (10/03 2203)  Physical Exam: BP Readings from Last 1 Encounters:  08/21/22 (!) 125/57     Wt Readings from Last 1 Encounters:  08/20/22 107.5 kg    Weight change:  Body mass index is 44.78 kg/m. HEENT: [place]/AT, Eyes-Brown, Conjunctiva-Pale pink, Sclera-Non-icteric Neck: No JVD, No bruit, Trachea midline. Lungs:  Clear, Bilateral. Cardiac:  Regular rhythm, normal S1 and S2, no S3. II/VI systolic murmur. Abdomen:  Soft, non-tender. BS present. Extremities:  No edema present. No cyanosis. No clubbing. CNS: AxOx3, Cranial nerves grossly intact, moves all 4 extremities.  Skin: Warm and dry.   Intake/Output from previous day: 10/03 0701 - 10/04 0700 In: 40.3 [I.V.:40.3] Out: -     Lab Results: BMET    Component Value Date/Time   NA 142 08/21/2022 0020   NA 142 08/20/2022 1612   NA 143 04/12/2022 1027   NA 142 04/08/2022 0137   NA 142 01/09/2022 1632   NA 141 06/11/2019 1506   NA 141 08/08/2017 1009   NA 140 02/05/2017 0941   NA 142 08/05/2016 1022   K 4.0 08/21/2022 0020   K 4.0 08/20/2022 1612   K 4.0 04/12/2022 1027   K 4.0 08/08/2017 1009   K 4.1 02/05/2017 0941   K 4.1 08/05/2016 1022   CL 109 08/21/2022 0020   CL 106 08/20/2022 1612   CL 100 04/12/2022 1027   CO2 26 08/21/2022 0020   CO2 27 08/20/2022 1612   CO2 25 04/12/2022 1027   CO2 25 08/08/2017 1009   CO2 23 02/05/2017 0941   CO2 25 08/05/2016 1022   GLUCOSE 131 (H) 08/21/2022 0020   GLUCOSE 120 (H) 08/20/2022 1612    GLUCOSE 115 (H) 04/12/2022 1027   GLUCOSE 206 (H) 04/08/2022 0137   GLUCOSE 95 08/08/2017 1009   GLUCOSE 120 02/05/2017 0941   GLUCOSE 107 08/05/2016 1022   BUN 16 08/21/2022 0020   BUN 17 08/20/2022 1612   BUN 11 04/12/2022 1027   BUN 13 04/08/2022 0137   BUN 14 01/09/2022 1632   BUN 15 06/11/2019 1506   BUN 21.7 08/08/2017 1009   BUN 14.4 02/05/2017 0941   BUN 12.0 08/05/2016 1022   CREATININE 0.81 08/21/2022 0020   CREATININE 0.86 08/20/2022 1612   CREATININE 0.86 04/12/2022 1027   CREATININE 0.81 04/19/2021 1123   CREATININE 1.19 (H) 04/19/2019 1015   CREATININE 0.8 08/08/2017 1009   CREATININE 0.8 02/05/2017 0941   CREATININE 0.8 08/05/2016 1022   CALCIUM 8.8 (L) 08/21/2022 0020   CALCIUM 9.3 08/20/2022 1612   CALCIUM 8.7 04/12/2022 1027   CALCIUM 9.0 08/08/2017 1009   CALCIUM 9.4 02/05/2017 0941   CALCIUM 8.8 08/05/2016 1022   GFRNONAA >60 08/21/2022 0020   GFRNONAA >60 08/20/2022 1612   GFRNONAA 45 (L) 04/08/2022 0137   GFRNONAA >60 04/19/2021 1123   GFRNONAA 45 (L) 04/19/2019 1015   GFRNONAA 82 10/23/2015 1158   GFRAA >60 06/02/2020 2100   GFRAA >60 11/28/2019 0224  GFRAA >60 11/27/2019 0150   GFRAA 52 (L) 04/19/2019 1015   GFRAA >89 10/23/2015 1158   CBC    Component Value Date/Time   WBC 11.1 (H) 08/21/2022 0020   RBC 4.10 08/21/2022 0020   HGB 10.2 (L) 08/21/2022 0020   HGB 9.3 (L) 04/12/2022 1027   HGB 12.8 08/08/2017 1009   HCT 36.3 08/21/2022 0020   HCT 30.0 (L) 04/12/2022 1027   HCT 39.8 08/08/2017 1009   PLT 250 08/21/2022 0020   PLT 287 04/12/2022 1027   MCV 88.5 08/21/2022 0020   MCV 92 04/12/2022 1027   MCV 93.9 08/08/2017 1009   MCH 24.9 (L) 08/21/2022 0020   MCHC 28.1 (L) 08/21/2022 0020   RDW 17.3 (H) 08/21/2022 0020   RDW 13.3 04/12/2022 1027   RDW 14.8 (H) 08/08/2017 1009   LYMPHSABS 1.9 07/23/2022 1449   LYMPHSABS 2.6 01/09/2022 1632   LYMPHSABS 3.2 08/08/2017 1009   MONOABS 1.2 (H) 07/23/2022 1449   MONOABS 1.2 (H) 08/08/2017  1009   EOSABS 0.3 07/23/2022 1449   EOSABS 0.6 (H) 01/09/2022 1632   BASOSABS 0.1 07/23/2022 1449   BASOSABS 0.1 01/09/2022 1632   BASOSABS 0.1 08/08/2017 1009   HEPATIC Function Panel Recent Labs    10/26/21 1347 12/30/21 0807  PROT 6.4* 6.7  ALBUMIN 3.4* 3.8  AST 29 39  ALT 25 17  ALKPHOS 99 71   HEMOGLOBIN A1C Lab Results  Component Value Date   MPG 151.33 08/21/2022   CARDIAC ENZYMES Lab Results  Component Value Date   TROPONINI 1.82 (HH) 03/04/2019   TROPONINI 2.18 (HH) 03/03/2019   TROPONINI 1.83 (HH) 03/03/2019   BNP Recent Labs    07/23/22 1449  PROBNP 98.0   TSH Recent Labs    08/21/22 0020  TSH 1.893   CHOLESTEROL Recent Labs    12/31/21 0833 08/21/22 0020  CHOL 146 132    Scheduled Meds:  amiodarone  100 mg Oral Daily   amLODipine  5 mg Oral Daily   aspirin EC  81 mg Oral Daily   busPIRone  15 mg Oral TID   Chlorhexidine Gluconate Cloth  6 each Topical Q0600   DULoxetine  60 mg Oral Daily   etomidate  8 mg Intravenous Once   insulin aspart  0-20 Units Subcutaneous TID WC   metoprolol tartrate  12.5 mg Oral BID   multivitamin with minerals  1 tablet Oral Daily   mupirocin ointment  1 Application Nasal BID   pantoprazole  40 mg Oral Daily   pregabalin  75 mg Oral BID   rosuvastatin  20 mg Oral Daily   Continuous Infusions:  heparin 1,400 Units/hr (08/21/22 0300)   PRN Meds:.acetaminophen, albuterol, nitroGLYCERIN, ondansetron (ZOFRAN) IV, mouth rinse  Assessment/Plan: Acute coronary syndrome Paroxysmal atrial fibrillation Acute diastolic left heart failure CAD S/P Coronary stenting COPD OSA Obesity Type 2 DM  Plan: Awaiting echocardiogram. Consider cardiac catheterization   LOS: 1 day   Time spent including chart review, lab review, examination, discussion with patient/Nurse : 30 min   [name]  MD  08/21/2022, 8:48 AM
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
## 2022-08-21 NOTE — Progress Notes (Signed)
Mobility Specialist Progress Note    08/21/22 1113  Mobility  Activity Ambulated with assistance to bathroom  Activity Response Tolerated well  Distance Ambulated (ft) 20 ft (10+10)  $Mobility charge 1 Mobility  Level of Assistance Standby assist, set-up cues, supervision of patient - no hands on  Assistive Device Cane   Pt received sitting EOB. Had void. No complaints. Left sitting EOB w/ RN and daughter present.   Hildred Alamin Mobility Specialist

## 2022-08-21 NOTE — Progress Notes (Signed)
Ref: Gerrit Heck, MD   Subjective:  No chest pain. VS stable. Troponin I is increasing. Monitor shows sinus rhythm.  Objective:  Vital Signs in the last 24 hours: Temp:  [98 F (36.7 C)-99.1 F (37.3 C)] 98.4 F (36.9 C) (10/04 0700) Pulse Rate:  [63-143] 72 (10/04 0700) Cardiac Rhythm: Normal sinus rhythm (10/04 0802) Resp:  [14-39] 20 (10/04 0700) BP: (84-156)/(45-110) 125/57 (10/04 0700) SpO2:  [92 %-99 %] 92 % (10/04 0700) Weight:  [104.3 kg-107.5 kg] 107.5 kg (10/03 2203)  Physical Exam: BP Readings from Last 1 Encounters:  08/21/22 (!) 125/57     Wt Readings from Last 1 Encounters:  08/20/22 107.5 kg    Weight change:  Body mass index is 44.78 kg/m. HEENT: [place]/AT, Eyes-Brown, Conjunctiva-Pale pink, Sclera-Non-icteric Neck: No JVD, No bruit, Trachea midline. Lungs:  Clear, Bilateral. Cardiac:  Regular rhythm, normal S1 and S2, no S3. II/VI systolic murmur. Abdomen:  Soft, non-tender. BS present. Extremities:  No edema present. No cyanosis. No clubbing. CNS: AxOx3, Cranial nerves grossly intact, moves all 4 extremities.  Skin: Warm and dry.   Intake/Output from previous day: 10/03 0701 - 10/04 0700 In: 40.3 [I.V.:40.3] Out: -     Lab Results: BMET    Component Value Date/Time   NA 142 08/21/2022 0020   NA 142 08/20/2022 1612   NA 143 04/12/2022 1027   NA 142 04/08/2022 0137   NA 142 01/09/2022 1632   NA 141 06/11/2019 1506   NA 141 08/08/2017 1009   NA 140 02/05/2017 0941   NA 142 08/05/2016 1022   K 4.0 08/21/2022 0020   K 4.0 08/20/2022 1612   K 4.0 04/12/2022 1027   K 4.0 08/08/2017 1009   K 4.1 02/05/2017 0941   K 4.1 08/05/2016 1022   CL 109 08/21/2022 0020   CL 106 08/20/2022 1612   CL 100 04/12/2022 1027   CO2 26 08/21/2022 0020   CO2 27 08/20/2022 1612   CO2 25 04/12/2022 1027   CO2 25 08/08/2017 1009   CO2 23 02/05/2017 0941   CO2 25 08/05/2016 1022   GLUCOSE 131 (H) 08/21/2022 0020   GLUCOSE 120 (H) 08/20/2022 1612    GLUCOSE 115 (H) 04/12/2022 1027   GLUCOSE 206 (H) 04/08/2022 0137   GLUCOSE 95 08/08/2017 1009   GLUCOSE 120 02/05/2017 0941   GLUCOSE 107 08/05/2016 1022   BUN 16 08/21/2022 0020   BUN 17 08/20/2022 1612   BUN 11 04/12/2022 1027   BUN 13 04/08/2022 0137   BUN 14 01/09/2022 1632   BUN 15 06/11/2019 1506   BUN 21.7 08/08/2017 1009   BUN 14.4 02/05/2017 0941   BUN 12.0 08/05/2016 1022   CREATININE 0.81 08/21/2022 0020   CREATININE 0.86 08/20/2022 1612   CREATININE 0.86 04/12/2022 1027   CREATININE 0.81 04/19/2021 1123   CREATININE 1.19 (H) 04/19/2019 1015   CREATININE 0.8 08/08/2017 1009   CREATININE 0.8 02/05/2017 0941   CREATININE 0.8 08/05/2016 1022   CALCIUM 8.8 (L) 08/21/2022 0020   CALCIUM 9.3 08/20/2022 1612   CALCIUM 8.7 04/12/2022 1027   CALCIUM 9.0 08/08/2017 1009   CALCIUM 9.4 02/05/2017 0941   CALCIUM 8.8 08/05/2016 1022   GFRNONAA >60 08/21/2022 0020   GFRNONAA >60 08/20/2022 1612   GFRNONAA 45 (L) 04/08/2022 0137   GFRNONAA >60 04/19/2021 1123   GFRNONAA 45 (L) 04/19/2019 1015   GFRNONAA 82 10/23/2015 1158   GFRAA >60 06/02/2020 2100   GFRAA >60 11/28/2019 0224  GFRAA >60 11/27/2019 0150   GFRAA 52 (L) 04/19/2019 1015   GFRAA >89 10/23/2015 1158   CBC    Component Value Date/Time   WBC 11.1 (H) 08/21/2022 0020   RBC 4.10 08/21/2022 0020   HGB 10.2 (L) 08/21/2022 0020   HGB 9.3 (L) 04/12/2022 1027   HGB 12.8 08/08/2017 1009   HCT 36.3 08/21/2022 0020   HCT 30.0 (L) 04/12/2022 1027   HCT 39.8 08/08/2017 1009   PLT 250 08/21/2022 0020   PLT 287 04/12/2022 1027   MCV 88.5 08/21/2022 0020   MCV 92 04/12/2022 1027   MCV 93.9 08/08/2017 1009   MCH 24.9 (L) 08/21/2022 0020   MCHC 28.1 (L) 08/21/2022 0020   RDW 17.3 (H) 08/21/2022 0020   RDW 13.3 04/12/2022 1027   RDW 14.8 (H) 08/08/2017 1009   LYMPHSABS 1.9 07/23/2022 1449   LYMPHSABS 2.6 01/09/2022 1632   LYMPHSABS 3.2 08/08/2017 1009   MONOABS 1.2 (H) 07/23/2022 1449   MONOABS 1.2 (H) 08/08/2017  1009   EOSABS 0.3 07/23/2022 1449   EOSABS 0.6 (H) 01/09/2022 1632   BASOSABS 0.1 07/23/2022 1449   BASOSABS 0.1 01/09/2022 1632   BASOSABS 0.1 08/08/2017 1009   HEPATIC Function Panel Recent Labs    10/26/21 1347 12/30/21 0807  PROT 6.4* 6.7  ALBUMIN 3.4* 3.8  AST 29 39  ALT 25 17  ALKPHOS 99 71   HEMOGLOBIN A1C Lab Results  Component Value Date   MPG 151.33 08/21/2022   CARDIAC ENZYMES Lab Results  Component Value Date   TROPONINI 1.82 (HH) 03/04/2019   TROPONINI 2.18 (HH) 03/03/2019   TROPONINI 1.83 (HH) 03/03/2019   BNP Recent Labs    07/23/22 1449  PROBNP 98.0   TSH Recent Labs    08/21/22 0020  TSH 1.893   CHOLESTEROL Recent Labs    12/31/21 0833 08/21/22 0020  CHOL 146 132    Scheduled Meds:  amiodarone  100 mg Oral Daily   amLODipine  5 mg Oral Daily   aspirin EC  81 mg Oral Daily   busPIRone  15 mg Oral TID   Chlorhexidine Gluconate Cloth  6 each Topical Q0600   DULoxetine  60 mg Oral Daily   etomidate  8 mg Intravenous Once   insulin aspart  0-20 Units Subcutaneous TID WC   metoprolol tartrate  12.5 mg Oral BID   multivitamin with minerals  1 tablet Oral Daily   mupirocin ointment  1 Application Nasal BID   pantoprazole  40 mg Oral Daily   pregabalin  75 mg Oral BID   rosuvastatin  20 mg Oral Daily   Continuous Infusions:  heparin 1,400 Units/hr (08/21/22 0300)   PRN Meds:.acetaminophen, albuterol, nitroGLYCERIN, ondansetron (ZOFRAN) IV, mouth rinse  Assessment/Plan: Acute coronary syndrome Paroxysmal atrial fibrillation Acute diastolic left heart failure CAD S/P Coronary stenting COPD OSA Obesity Type 2 DM  Plan: Awaiting echocardiogram. Consider cardiac catheterization   LOS: 1 day   Time spent including chart review, lab review, examination, discussion with patient/Nurse : 30 min   [name]  MD  08/21/2022, 8:48 AM
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
## 2022-08-22 ENCOUNTER — Inpatient Hospital Stay (HOSPITAL_COMMUNITY)
Admission: EM | Disposition: A | Discharge status: Home / Self Care | Payer: Self-pay | Source: Home / Self Care | Attending: Cardiovascular Disease

## 2022-08-22 ENCOUNTER — Other Ambulatory Visit: Payer: Self-pay

## 2022-08-22 ENCOUNTER — Encounter (HOSPITAL_COMMUNITY): Payer: Self-pay | Admitting: Cardiovascular Disease

## 2022-08-22 DIAGNOSIS — I2510 Atherosclerotic heart disease of native coronary artery without angina pectoris: Secondary | ICD-10-CM

## 2022-08-22 HISTORY — PX: CORONARY STENT INTERVENTION: CATH118234

## 2022-08-22 HISTORY — PX: LEFT HEART CATH AND CORONARY ANGIOGRAPHY: CATH118249

## 2022-08-22 LAB — APTT: aPTT: 75 seconds — ABNORMAL HIGH (ref 24–36)

## 2022-08-22 LAB — CBC
HCT: 35.6 % — ABNORMAL LOW (ref 36.0–46.0)
Hemoglobin: 9.9 g/dL — ABNORMAL LOW (ref 12.0–15.0)
MCH: 24.6 pg — ABNORMAL LOW (ref 26.0–34.0)
MCHC: 27.8 g/dL — ABNORMAL LOW (ref 30.0–36.0)
MCV: 88.6 fL (ref 80.0–100.0)
Platelets: 216 10*3/uL (ref 150–400)
RBC: 4.02 MIL/uL (ref 3.87–5.11)
RDW: 17.1 % — ABNORMAL HIGH (ref 11.5–15.5)
WBC: 13.5 10*3/uL — ABNORMAL HIGH (ref 4.0–10.5)
nRBC: 0 % (ref 0.0–0.2)

## 2022-08-22 LAB — LIPOPROTEIN A (LPA): Lipoprotein (a): 40.7 nmol/L — ABNORMAL HIGH (ref <75.0)

## 2022-08-22 LAB — HEPARIN LEVEL (UNFRACTIONATED): Heparin Unfractionated: 0.62 IU/mL (ref 0.30–0.70)

## 2022-08-22 LAB — GLUCOSE, CAPILLARY
Glucose-Capillary: 118 mg/dL — ABNORMAL HIGH (ref 70–99)
Glucose-Capillary: 146 mg/dL — ABNORMAL HIGH (ref 70–99)
Glucose-Capillary: 156 mg/dL — ABNORMAL HIGH (ref 70–99)

## 2022-08-22 LAB — POCT ACTIVATED CLOTTING TIME
Activated Clotting Time: 263 seconds
Activated Clotting Time: 197 seconds
Activated Clotting Time: 173 seconds

## 2022-08-22 SURGERY — LEFT HEART CATH AND CORONARY ANGIOGRAPHY
Anesthesia: LOCAL

## 2022-08-22 MED ORDER — HEPARIN SODIUM (PORCINE) 1000 UNIT/ML IJ SOLN
INTRAMUSCULAR | Status: DC | PRN
  Administered 2022-08-22: 10000 [IU] via INTRAVENOUS

## 2022-08-22 MED ORDER — TRAZODONE HCL 50 MG PO TABS
50.0000 mg | ORAL_TABLET | Freq: Every evening | ORAL | Status: DC | PRN
  Administered 2022-08-22 (×2): 50 mg via ORAL
  Filled 2022-08-22 (×2): qty 1

## 2022-08-22 MED ORDER — MIDAZOLAM HCL 2 MG/2ML IJ SOLN
INTRAMUSCULAR | Status: DC | PRN
  Administered 2022-08-22: 1 mg via INTRAVENOUS

## 2022-08-22 MED ORDER — IOHEXOL 350 MG/ML SOLN
INTRAVENOUS | Status: DC | PRN
  Administered 2022-08-22: 35 mL

## 2022-08-22 MED ORDER — FENTANYL CITRATE (PF) 100 MCG/2ML IJ SOLN
INTRAMUSCULAR | Status: DC | PRN
  Administered 2022-08-22: 50 ug
  Administered 2022-08-22: 25 ug via INTRAVENOUS

## 2022-08-22 MED ORDER — FAMOTIDINE IN NACL 20-0.9 MG/50ML-% IV SOLN: INTRAVENOUS | Status: DC | PRN

## 2022-08-22 MED ORDER — ACETAMINOPHEN 325 MG PO TABS: 650.0000 mg | ORAL_TABLET | ORAL | Status: DC | PRN

## 2022-08-22 MED ORDER — LABETALOL HCL 5 MG/ML IV SOLN: 10.0000 mg | INTRAVENOUS | Status: AC | PRN

## 2022-08-22 MED ORDER — APIXABAN 5 MG PO TABS
5.0000 mg | ORAL_TABLET | Freq: Two times a day (BID) | ORAL | Status: DC
  Administered 2022-08-22 – 2022-08-23 (×2): 5 mg via ORAL
  Filled 2022-08-22 (×2): qty 1

## 2022-08-22 MED ORDER — NITROGLYCERIN 1 MG/10 ML FOR IR/CATH LAB
INTRA_ARTERIAL | Status: AC
  Filled 2022-08-22: qty 10

## 2022-08-22 MED ORDER — FAMOTIDINE IN NACL 20-0.9 MG/50ML-% IV SOLN
INTRAVENOUS | Status: AC
  Filled 2022-08-22: qty 50

## 2022-08-22 MED ORDER — SODIUM CHLORIDE 0.9 % WEIGHT BASED INFUSION
1.0000 mL/kg/h | INTRAVENOUS | Status: AC
  Administered 2022-08-22: 1 mL/kg/h via INTRAVENOUS

## 2022-08-22 MED ORDER — LIDOCAINE HCL (PF) 1 % IJ SOLN
INTRAMUSCULAR | Status: DC | PRN
  Administered 2022-08-22: 15 mL

## 2022-08-22 MED ORDER — FAMOTIDINE IN NACL 20-0.9 MG/50ML-% IV SOLN
INTRAVENOUS | Status: AC | PRN
  Administered 2022-08-22: 20 mg via INTRAVENOUS

## 2022-08-22 MED ORDER — SODIUM CHLORIDE 0.9 % IV SOLN: 250.0000 mL | INTRAVENOUS | Status: DC | PRN

## 2022-08-22 MED ORDER — SODIUM CHLORIDE 0.9% FLUSH
3.0000 mL | Freq: Two times a day (BID) | INTRAVENOUS | Status: DC
  Administered 2022-08-22 – 2022-08-23 (×2): 3 mL via INTRAVENOUS

## 2022-08-22 MED ORDER — FENTANYL CITRATE (PF) 100 MCG/2ML IJ SOLN
INTRAMUSCULAR | Status: AC
  Filled 2022-08-22: qty 2

## 2022-08-22 MED ORDER — CLOPIDOGREL BISULFATE 75 MG PO TABS
75.0000 mg | ORAL_TABLET | Freq: Every day | ORAL | Status: DC
  Administered 2022-08-23: 75 mg via ORAL
  Filled 2022-08-22: qty 1

## 2022-08-22 MED ORDER — HYDRALAZINE HCL 20 MG/ML IJ SOLN: 10.0000 mg | INTRAMUSCULAR | Status: AC | PRN

## 2022-08-22 MED ORDER — SODIUM CHLORIDE 0.9 % IV SOLN: INTRAVENOUS | Status: DC

## 2022-08-22 MED ORDER — ASPIRIN 81 MG PO CHEW
81.0000 mg | CHEWABLE_TABLET | Freq: Every day | ORAL | Status: DC
  Administered 2022-08-23: 81 mg via ORAL
  Filled 2022-08-22: qty 1

## 2022-08-22 MED ORDER — MIDAZOLAM HCL 2 MG/2ML IJ SOLN
INTRAMUSCULAR | Status: AC
  Filled 2022-08-22: qty 2

## 2022-08-22 MED ORDER — CLOPIDOGREL BISULFATE 300 MG PO TABS
ORAL_TABLET | ORAL | Status: DC | PRN
  Administered 2022-08-22: 600 mg via ORAL

## 2022-08-22 MED ORDER — FENTANYL CITRATE (PF) 100 MCG/2ML IJ SOLN
INTRAMUSCULAR | Status: DC | PRN
  Administered 2022-08-22: 25 ug via INTRAVENOUS

## 2022-08-22 MED ORDER — HEPARIN SODIUM (PORCINE) 1000 UNIT/ML IJ SOLN: INTRAMUSCULAR | Status: DC | PRN

## 2022-08-22 MED ORDER — ONDANSETRON HCL 4 MG/2ML IJ SOLN: 4.0000 mg | Freq: Four times a day (QID) | INTRAMUSCULAR | Status: DC | PRN

## 2022-08-22 MED ORDER — IOHEXOL 350 MG/ML SOLN
INTRAVENOUS | Status: DC | PRN
  Administered 2022-08-22: 100 mL

## 2022-08-22 MED ORDER — CLOPIDOGREL BISULFATE 300 MG PO TABS
ORAL_TABLET | ORAL | Status: AC
  Filled 2022-08-22: qty 2

## 2022-08-22 MED ORDER — LIDOCAINE HCL (PF) 1 % IJ SOLN
INTRAMUSCULAR | Status: AC
  Filled 2022-08-22: qty 30

## 2022-08-22 MED ORDER — SODIUM CHLORIDE 0.9% FLUSH: 3.0000 mL | INTRAVENOUS | Status: DC | PRN

## 2022-08-22 MED ORDER — HEPARIN (PORCINE) IN NACL 1000-0.9 UT/500ML-% IV SOLN
INTRAVENOUS | Status: DC | PRN
  Administered 2022-08-22 (×2): 500 mL

## 2022-08-22 SURGICAL SUPPLY — 19 items
BALL SAPPHIRE NC24 3.75X10 (BALLOONS) ×1
BALLOON SAPPHIRE NC24 3.75X10 (BALLOONS) IMPLANT
CATH INFINITI 5FR AL1 (CATHETERS) IMPLANT
CATH INFINITI 5FR MULTPACK ANG (CATHETERS) IMPLANT
CATH LAUNCHER 5F NOTO (CATHETERS) IMPLANT
CATH LAUNCHER 6FR AL1 (CATHETERS) IMPLANT
CATHETER LAUNCHER 5F NOTO (CATHETERS) ×1
CATHETER LAUNCHER 6FR AL1 (CATHETERS) ×1
KIT ENCORE 26 ADVANTAGE (KITS) IMPLANT
KIT HEART LEFT (KITS) ×1 IMPLANT
PACK CARDIAC CATHETERIZATION (CUSTOM PROCEDURE TRAY) ×1 IMPLANT
SHEATH PINNACLE 5F 10CM (SHEATH) IMPLANT
SHEATH PINNACLE 6F 10CM (SHEATH) IMPLANT
SHEATH PROBE COVER 6X72 (BAG) IMPLANT
STENT SYNERGY XD 3.50X12 (Permanent Stent) IMPLANT
SYNERGY XD 3.50X12 (Permanent Stent) ×1 IMPLANT
TRANSDUCER W/STOPCOCK (MISCELLANEOUS) ×1 IMPLANT
WIRE ASAHI PROWATER 180CM (WIRE) IMPLANT
WIRE EMERALD 3MM-J .035X150CM (WIRE) IMPLANT

## 2022-08-22 NOTE — Interval H&P Note (Signed)
History and Physical Interval Note:  08/22/2022 10:07 AM  Martha Mora  has presented today for surgery, with the diagnosis of chest pain.  The various methods of treatment have been discussed with the patient and family. After consideration of risks, benefits and other options for treatment, the patient has consented to  Procedure(s): LEFT HEART CATH AND CORONARY ANGIOGRAPHY (N/A) as a surgical intervention.  The patient's history has been reviewed, patient examined, no change in status, stable for surgery.  I have reviewed the patient's chart and labs.  Questions were answered to the patient's satisfaction.     Birdie Riddle

## 2022-08-22 NOTE — Progress Notes (Signed)
Patient back to room from cath lab. Right groin approach, no bleeding, oozing or hematoma. Area surrounding is soft and pink. Patient settled in and eating. Bedrest until 6 p.m.

## 2022-08-22 NOTE — Plan of Care (Signed)

## 2022-08-22 NOTE — Plan of Care (Signed)
Problem: Education: Goal: Knowledge of General Education information will improve Description: Including pain rating scale, medication(s)/side effects and non-pharmacologic comfort measures Outcome: Progressing   Problem: Health Behavior/Discharge Planning: Goal: Ability to manage health-related needs will improve Outcome: Progressing   Problem: Clinical Measurements: Goal: Ability to maintain clinical measurements within normal limits will improve Outcome: Progressing Goal: Will remain free from infection Outcome: Progressing Goal: Diagnostic test results will improve Outcome: Progressing Goal: Respiratory complications will improve Outcome: Progressing Goal: Cardiovascular complication will be avoided Outcome: Progressing   Problem: Activity: Goal: Risk for activity intolerance will decrease Outcome: Progressing   Problem: Nutrition: Goal: Adequate nutrition will be maintained Outcome: Progressing   Problem: Coping: Goal: Level of anxiety will decrease Outcome: Progressing   Problem: Elimination: Goal: Will not experience complications related to bowel motility Outcome: Progressing Goal: Will not experience complications related to urinary retention Outcome: Progressing   Problem: Pain Managment: Goal: General experience of comfort will improve Outcome: Progressing   Problem: Safety: Goal: Ability to remain free from injury will improve Outcome: Progressing   Problem: Skin Integrity: Goal: Risk for impaired skin integrity will decrease Outcome: Progressing   Problem: Education: Goal: Understanding of cardiac disease, CV risk reduction, and recovery process will improve Outcome: Progressing Goal: Individualized Educational Video(s) Outcome: Progressing   Problem: Activity: Goal: Ability to tolerate increased activity will improve Outcome: Progressing   Problem: Cardiac: Goal: Ability to achieve and maintain adequate cardiovascular perfusion will  improve Outcome: Progressing   Problem: Health Behavior/Discharge Planning: Goal: Ability to safely manage health-related needs after discharge will improve Outcome: Progressing   Problem: Education: Goal: Ability to describe self-care measures that may prevent or decrease complications (Diabetes Survival Skills Education) will improve Outcome: Progressing Goal: Individualized Educational Video(s) Outcome: Progressing   Problem: Coping: Goal: Ability to adjust to condition or change in health will improve Outcome: Progressing   Problem: Fluid Volume: Goal: Ability to maintain a balanced intake and output will improve Outcome: Progressing   Problem: Health Behavior/Discharge Planning: Goal: Ability to identify and utilize available resources and services will improve Outcome: Progressing Goal: Ability to manage health-related needs will improve Outcome: Progressing   Problem: Metabolic: Goal: Ability to maintain appropriate glucose levels will improve Outcome: Progressing   Problem: Nutritional: Goal: Maintenance of adequate nutrition will improve Outcome: Progressing Goal: Progress toward achieving an optimal weight will improve Outcome: Progressing   Problem: Skin Integrity: Goal: Risk for impaired skin integrity will decrease Outcome: Progressing   Problem: Tissue Perfusion: Goal: Adequacy of tissue perfusion will improve Outcome: Progressing   Problem: Education: Goal: Understanding of CV disease, CV risk reduction, and recovery process will improve Outcome: Progressing Goal: Individualized Educational Video(s) Outcome: Progressing   Problem: Activity: Goal: Ability to return to baseline activity level will improve Outcome: Progressing   Problem: Cardiovascular: Goal: Ability to achieve and maintain adequate cardiovascular perfusion will improve Outcome: Progressing Goal: Vascular access site(s) Level 0-1 will be maintained Outcome: Progressing    Problem: Health Behavior/Discharge Planning: Goal: Ability to safely manage health-related needs after discharge will improve Outcome: Progressing   Problem: Education: Goal: Understanding of CV disease, CV risk reduction, and recovery process will improve Outcome: Progressing Goal: Individualized Educational Video(s) Outcome: Progressing   Problem: Activity: Goal: Ability to return to baseline activity level will improve Outcome: Progressing   Problem: Cardiovascular: Goal: Ability to achieve and maintain adequate cardiovascular perfusion will improve Outcome: Progressing Goal: Vascular access site(s) Level 0-1 will be maintained Outcome: Progressing   Problem: Health Behavior/Discharge Planning: Goal:  Ability to safely manage health-related needs after discharge will improve Outcome: Progressing

## 2022-08-22 NOTE — Progress Notes (Signed)
Site area: Right groin a 6 french arterial sheath was removed  Site Prior to Removal:  Level 0  Pressure Applied For 20 MINUTES    Manual Beginning at 1400X 4 hours  Manual:   Yes.    Patient Status During Pull:  stable  Post Pull Groin Site:  Level 0  Post Pull Instructions Given:  Yes.    Post Pull Pulses Present:  Yes.    Dressing Applied:  Yes.    Comments:

## 2022-08-23 ENCOUNTER — Other Ambulatory Visit (HOSPITAL_COMMUNITY): Payer: Self-pay

## 2022-08-23 LAB — BASIC METABOLIC PANEL
Anion gap: 6 (ref 5–15)
BUN: 15 mg/dL (ref 8–23)
CO2: 30 mmol/L (ref 22–32)
Calcium: 8.6 mg/dL — ABNORMAL LOW (ref 8.9–10.3)
Chloride: 105 mmol/L (ref 98–111)
Creatinine, Ser: 0.78 mg/dL (ref 0.44–1.00)
GFR, Estimated: >60 mL/min (ref >60)
Glucose, Bld: 128 mg/dL — ABNORMAL HIGH (ref 70–99)
Potassium: 4.2 mmol/L (ref 3.5–5.1)
Sodium: 141 mmol/L (ref 135–145)

## 2022-08-23 LAB — CBC
HCT: 31.4 % — ABNORMAL LOW (ref 36.0–46.0)
Hemoglobin: 8.9 g/dL — ABNORMAL LOW (ref 12.0–15.0)
MCH: 25 pg — ABNORMAL LOW (ref 26.0–34.0)
MCHC: 28.3 g/dL — ABNORMAL LOW (ref 30.0–36.0)
MCV: 88.2 fL (ref 80.0–100.0)
Platelets: 207 10*3/uL (ref 150–400)
RBC: 3.56 MIL/uL — ABNORMAL LOW (ref 3.87–5.11)
RDW: 17.1 % — ABNORMAL HIGH (ref 11.5–15.5)
WBC: 12.4 10*3/uL — ABNORMAL HIGH (ref 4.0–10.5)
nRBC: 0.2 % (ref 0.0–0.2)

## 2022-08-23 LAB — GLUCOSE, CAPILLARY
Glucose-Capillary: 132 mg/dL — ABNORMAL HIGH (ref 70–99)
Glucose-Capillary: 145 mg/dL — ABNORMAL HIGH (ref 70–99)

## 2022-08-23 LAB — IRON AND TIBC
Iron: 31 ug/dL (ref 28–170)
Saturation Ratios: 7 % — ABNORMAL LOW (ref 10.4–31.8)
TIBC: 461 ug/dL — ABNORMAL HIGH (ref 250–450)
UIBC: 430 ug/dL

## 2022-08-23 MED ORDER — NITROGLYCERIN 0.4 MG SL SUBL
0.4000 mg | SUBLINGUAL_TABLET | SUBLINGUAL | 1 refills | Status: DC | PRN
  Filled 2022-08-23: qty 25, 25d supply, fill #0

## 2022-08-23 MED ORDER — CLOPIDOGREL BISULFATE 75 MG PO TABS
75.0000 mg | ORAL_TABLET | Freq: Every day | ORAL | 3 refills | Status: DC
  Filled 2022-08-23: qty 30, 30d supply, fill #0

## 2022-08-23 MED ORDER — FUROSEMIDE 10 MG/ML IJ SOLN
40.0000 mg | Freq: Once | INTRAMUSCULAR | Status: AC
  Administered 2022-08-23: 40 mg via INTRAVENOUS
  Filled 2022-08-23: qty 4

## 2022-08-23 MED ORDER — ASPIRIN 81 MG PO CHEW: 81.0000 mg | CHEWABLE_TABLET | Freq: Every day | ORAL | Status: DC

## 2022-08-23 MED ORDER — PANTOPRAZOLE SODIUM 40 MG PO TBEC
40.0000 mg | DELAYED_RELEASE_TABLET | Freq: Every day | ORAL | 3 refills | Status: DC
  Filled 2022-08-23: qty 30, 30d supply, fill #0

## 2022-08-23 MED FILL — Nitroglycerin IV Soln 100 MCG/ML in D5W: INTRA_ARTERIAL | Qty: 10 | Status: AC

## 2022-08-23 NOTE — Progress Notes (Addendum)
CARDIAC REHAB PHASE I   Pt declined ambulation due to having back pain, reports she walked yesterday w/o CP. Pt was educated on MI through MI book, stent location, restrictions, Aspirin and Plavix use, exercise guidelines, heart healthy and low sodium diet, NTG use, CRPII, and HF education. Pt received HF book. Pt is being referred to CRPII at [facility] 10:10 AM 08/23/2022  [id number]

## 2022-08-23 NOTE — Plan of Care (Signed)
Problem: Education: Goal: Knowledge of General Education information will improve Description: Including pain rating scale, medication(s)/side effects and non-pharmacologic comfort measures 08/23/2022 1522 by Thressa Sheller, RN Outcome: Adequate for Discharge 08/23/2022 1325 by Thressa Sheller, RN Outcome: Progressing   Problem: Health Behavior/Discharge Planning: Goal: Ability to manage health-related needs will improve 08/23/2022 1522 by Thressa Sheller, RN Outcome: Adequate for Discharge 08/23/2022 1325 by Thressa Sheller, RN Outcome: Progressing   Problem: Clinical Measurements: Goal: Ability to maintain clinical measurements within normal limits will improve Outcome: Adequate for Discharge Goal: Will remain free from infection Outcome: Adequate for Discharge Goal: Diagnostic test results will improve Outcome: Adequate for Discharge Goal: Respiratory complications will improve Outcome: Adequate for Discharge Goal: Cardiovascular complication will be avoided Outcome: Adequate for Discharge   Problem: Activity: Goal: Risk for activity intolerance will decrease Outcome: Adequate for Discharge   Problem: Nutrition: Goal: Adequate nutrition will be maintained 08/23/2022 1522 by Thressa Sheller, RN Outcome: Adequate for Discharge 08/23/2022 1325 by Thressa Sheller, RN Outcome: Progressing   Problem: Coping: Goal: Level of anxiety will decrease Outcome: Adequate for Discharge   Problem: Elimination: Goal: Will not experience complications related to bowel motility 08/23/2022 1522 by Thressa Sheller, RN Outcome: Adequate for Discharge 08/23/2022 1325 by Thressa Sheller, RN Outcome: Progressing Goal: Will not experience complications related to urinary retention Outcome: Adequate for Discharge   Problem: Pain Managment: Goal: General experience of comfort will improve 08/23/2022 1522 by Thressa Sheller, RN Outcome: Adequate for  Discharge 08/23/2022 1325 by Thressa Sheller, RN Outcome: Progressing   Problem: Safety: Goal: Ability to remain free from injury will improve Outcome: Adequate for Discharge   Problem: Skin Integrity: Goal: Risk for impaired skin integrity will decrease Outcome: Adequate for Discharge   Problem: Education: Goal: Understanding of cardiac disease, CV risk reduction, and recovery process will improve 08/23/2022 1522 by Thressa Sheller, RN Outcome: Adequate for Discharge 08/23/2022 1326 by Thressa Sheller, RN Outcome: Progressing Goal: Individualized Educational Video(s) 08/23/2022 1522 by Thressa Sheller, RN Outcome: Adequate for Discharge 08/23/2022 1326 by Thressa Sheller, RN Outcome: Progressing   Problem: Activity: Goal: Ability to tolerate increased activity will improve 08/23/2022 1522 by Thressa Sheller, RN Outcome: Adequate for Discharge 08/23/2022 1326 by Thressa Sheller, RN Outcome: Progressing   Problem: Cardiac: Goal: Ability to achieve and maintain adequate cardiovascular perfusion will improve 08/23/2022 1522 by Thressa Sheller, RN Outcome: Adequate for Discharge 08/23/2022 1326 by Thressa Sheller, RN Outcome: Progressing   Problem: Health Behavior/Discharge Planning: Goal: Ability to safely manage health-related needs after discharge will improve Outcome: Adequate for Discharge   Problem: Education: Goal: Ability to describe self-care measures that may prevent or decrease complications (Diabetes Survival Skills Education) will improve Outcome: Adequate for Discharge Goal: Individualized Educational Video(s) Outcome: Adequate for Discharge   Problem: Coping: Goal: Ability to adjust to condition or change in health will improve Outcome: Adequate for Discharge   Problem: Fluid Volume: Goal: Ability to maintain a balanced intake and output will improve Outcome: Adequate for Discharge   Problem: Health Behavior/Discharge  Planning: Goal: Ability to identify and utilize available resources and services will improve Outcome: Adequate for Discharge Goal: Ability to manage health-related needs will improve Outcome: Adequate for Discharge   Problem: Metabolic: Goal: Ability to maintain appropriate glucose levels will improve Outcome: Adequate for Discharge   Problem: Nutritional: Goal: Maintenance of adequate nutrition will improve Outcome: Adequate for Discharge Goal: Progress toward achieving  an optimal weight will improve Outcome: Adequate for Discharge   Problem: Skin Integrity: Goal: Risk for impaired skin integrity will decrease Outcome: Adequate for Discharge   Problem: Tissue Perfusion: Goal: Adequacy of tissue perfusion will improve Outcome: Adequate for Discharge   Problem: Education: Goal: Understanding of CV disease, CV risk reduction, and recovery process will improve Outcome: Adequate for Discharge Goal: Individualized Educational Video(s) Outcome: Adequate for Discharge   Problem: Activity: Goal: Ability to return to baseline activity level will improve 08/23/2022 1522 by Thressa Sheller, RN Outcome: Adequate for Discharge 08/23/2022 1326 by Thressa Sheller, RN Outcome: Progressing   Problem: Cardiovascular: Goal: Ability to achieve and maintain adequate cardiovascular perfusion will improve Outcome: Adequate for Discharge Goal: Vascular access site(s) Level 0-1 will be maintained Outcome: Adequate for Discharge   Problem: Health Behavior/Discharge Planning: Goal: Ability to safely manage health-related needs after discharge will improve Outcome: Adequate for Discharge   Problem: Education: Goal: Understanding of CV disease, CV risk reduction, and recovery process will improve Outcome: Adequate for Discharge Goal: Individualized Educational Video(s) Outcome: Adequate for Discharge   Problem: Activity: Goal: Ability to return to baseline activity level will  improve Outcome: Adequate for Discharge   Problem: Cardiovascular: Goal: Ability to achieve and maintain adequate cardiovascular perfusion will improve Outcome: Adequate for Discharge Goal: Vascular access site(s) Level 0-1 will be maintained Outcome: Adequate for Discharge   Problem: Health Behavior/Discharge Planning: Goal: Ability to safely manage health-related needs after discharge will improve Outcome: Adequate for Discharge   Problem: Education: Goal: Knowledge of cardiac device and self-care will improve Outcome: Adequate for Discharge Goal: Ability to safely manage health related needs after discharge will improve 08/23/2022 1522 by Thressa Sheller, RN Outcome: Adequate for Discharge 08/23/2022 1326 by Thressa Sheller, RN Outcome: Progressing Goal: Individualized Educational Video(s) Outcome: Adequate for Discharge

## 2022-08-23 NOTE — Plan of Care (Signed)
Discussed with patient plan of care for the evening, pain management and bedtime medications with some teach back displayed.  And importance of rest to help with her recovery since SOB with exertion.  Problem: Education: Goal: Knowledge of General Education information will improve Description: Including pain rating scale, medication(s)/side effects and non-pharmacologic comfort measures Outcome: Progressing   Problem: Health Behavior/Discharge Planning: Goal: Ability to manage health-related needs will improve Outcome: Progressing

## 2022-08-23 NOTE — Discharge Summary (Signed)
Physician Discharge Summary  Patient ID: Martha Mora MRN: [id number] DOB/AGE: 06/30/1946 76 y.o.  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 Fib on Eliquis, MR, diastolic left heart failure, CAD, S/P coronary stenting and s/p right breast cancer had chest pain and atrial fibrillation with RVR. She was successfully cardioverted as her BP was too low. She had upward trent on her Troponin I levels.  She underwent cardiac catheterization that showed new mid to distal 1/3rd junction 75 % stenosis in RCA. A 3.5 x 12 drug eluting Synergy XD stent was successfully placed by Dr. [place] with 0 % residual stenosis and TIMI III flow.  She will have Aspirin, Plavix and Eliquis x 1 month then she will continue Eliquis and Plavix for 1 year and then back to Eliquis only. She was advised to resume low salt and low fat diet, decrease activity for 1 week and see me in 2 weeks and primary care in 1 week.  Consults: cardiology  Significant Diagnostic Studies: labs: Low Hgb of 8.9 to 10.2 gm. Mildly elevated WBC count of 11.1K to 13.5K and normal Platelets count. Normal BMET except mild hyperglycemia. Troponin I peaked at 144 ng. Iron studies are pending.  EKG: atrial fibrillation with RVR on admission, 08/20/2022  EKG: normal sinus rhythm on 08/23/2022  CXR: Unremarkable.  Echocardiogram on  08/21/2022 : Normal LV systolic function, moderate LVH, mild diastolic dysfunction, mild MR and mild TR.  Cardiac catheterization: Mild multivessel CAD except 75 % mid to distal 1/3rd junction RCA lesion and patent all previous stents in RCA and LAD. 75 % stenosis was reduced to 0 % with 3.5 x 12 mm Synergy XD drug eluting stent.  Treatments: cardiac meds: amiodarone, amlodipine, rosuvastatin, clopidogrel, aspirin and Apixaban.  Discharge Exam: Blood pressure 134/64, pulse 75, temperature 98.4 F (36.9 C), temperature source Oral, resp. rate 18, height '5\' 1"'$  (1.549 m), weight 107.5 kg, SpO2 90 %. General appearance: alert, cooperative and appears stated age. Head: Normocephalic, atraumatic. Eyes: Blue eyes, pink conjunctiva, corneas clear.   Neck: No adenopathy, no carotid bruit, no JVD, supple, symmetrical, trachea midline and thyroid not enlarged. Resp: Clear to auscultation bilaterally. Cardio: Regular rate and rhythm, S1, S2 normal, II/VI systolic murmur, no click, rub or gallop. GI: Soft, non-tender; bowel sounds normal; no organomegaly. Extremities: Trace lower leg edema, no cyanosis or clubbing. Skin: Warm and dry.  Neurologic: Alert and oriented X 3, normal strength and tone. Normal coordination and slow gait.  Disposition: Discharge disposition: 01-Home or Self Care        Allergies as of 08/23/2022       Reactions   Keflex [cephalexin] Other (See Comments)   "Made me feel funny" Tolerated Rocephin in April 2020 over several days and 07/01/19 in ED.   Penicillins Rash, Other (See Comments)   03/01/19 tolerated Zosyn Red bumps all over stomach Did it involve  swelling of the face/tongue/throat, SOB, or low BP? No Did it involve sudden or severe rash/hives, skin peeling, or any reaction on the inside of your mouth or nose? Yes Did you need to seek medical attention at a hospital or doctor's office? Yes When did it last happen?      30+ years  If all above answers are "NO",  may proceed with cephalosporin use.   Requip Xl [ropinirole Hcl] Other (See Comments)   Akathisia     Shellfish Allergy Nausea And Vomiting        Medication List     STOP taking these medications    omeprazole 40 MG capsule Commonly known as: PRILOSEC Replaced by: pantoprazole 40 MG tablet       TAKE these medications    acetaminophen 325 MG tablet Commonly known as: TYLENOL Take 2 tablets (650 mg total) by mouth every 6 (six) hours as needed for mild pain, fever or headache. What changed: how much to take   albuterol 108 (90 Base) MCG/ACT inhaler Commonly known as: VENTOLIN HFA TAKE 1-2 INHALATIONS EVERY 4-6 HOURS AS NEEDED FOR WHEEZING. DISPENSE SPACER AS NEEDED. What changed: See the new instructions.   amiodarone 200 MG tablet Commonly known as: PACERONE Take 1/2 tablet (100 mg total) by mouth daily. Schedule an appointment with your cardiologist prior to next refill.   amLODipine 5 MG tablet Commonly known as: NORVASC Take 5 mg by mouth daily.   apixaban 5 MG Tabs tablet Commonly known as: ELIQUIS Take 1 tablet (5 mg total) by mouth 2 (two) times daily.   aspirin 81 MG chewable tablet Chew 1 tablet (81 mg total) by mouth daily. Start taking on: August 24, 2022   busPIRone 15 MG tablet Commonly known as: BUSPAR Take 1 tablet (15 mg total) by mouth 3 (three) times daily.   Calcium Carb-Cholecalciferol 600-800 MG-UNIT Tabs Take 1 tablet by mouth 2 (two) times a day.   clopidogrel 75 MG tablet Commonly known as: PLAVIX Take 1 tablet (75 mg total) by mouth daily with breakfast. Start taking on: August 24, 2022   DULoxetine 60 MG capsule Commonly known as: CYMBALTA TAKE 1 CAPSULE BY MOUTH EVERY DAY What changed:  how much to take how to take this when to take this additional instructions   Iron 325 (65 Fe) MG Tabs Take 1 tablet (325 mg total) by mouth every other day.   loratadine 10 MG tablet Commonly known as: CLARITIN Take 1 tablet (10 mg  total) by mouth daily.   metoprolol tartrate 25 MG tablet Commonly known as: LOPRESSOR TAKE 0.5 TABLETS BY MOUTH 2 TIMES DAILY. What changed: See the new instructions.   multivitamin tablet Take 1 tablet by mouth daily. Herbal Life multivitamin   nitroGLYCERIN 0.4 MG SL tablet Commonly known as: NITROSTAT Place 1 tablet (0.4 mg total) under the tongue every 5 (five) minutes x 3 doses as needed for chest pain.   pantoprazole 40 MG tablet Commonly known as: PROTONIX Take 1 tablet (40 mg total) by mouth daily. Start taking on: August 24, 2022 Replaces: omeprazole 40 MG capsule   pregabalin 75 MG capsule Commonly known as: Lyrica Take 1 capsule (75 mg total) by mouth 2 (two) times daily.   PRESERVISION AREDS 2 PO Take 1 tablet by mouth daily.   Prolia 60 MG/ML Sosy injection Generic drug: denosumab Inject 60 mg into the skin every 6 (six) months.   rosuvastatin 20 MG tablet Commonly known as: CRESTOR Take 20 mg by  mouth daily.   traZODone 50 MG tablet Commonly known as: DESYREL TAKE 1 TABLET BY MOUTH EVERYDAY AT BEDTIME What changed: See the new instructions.   Vitamin D (Ergocalciferol) 1.25 MG (50000 UNIT) Caps capsule Commonly known as: DRISDOL TAKE 1 CAPSULE BY MOUTH ONE TIME PER WEEK What changed: See the new instructions.        Follow-up Information     Gerrit Heck, MD Follow up in 1 week(s).   Specialty: Family Medicine Contact information: [place] [address] [phone]         [name], MD Follow up in 2 week(s).   Specialty: Cardiology Contact information: [place] [address] [phone]                 Time spent: Review of old chart, current chart, lab, x-ray, cardiac tests and discussion with patient over 60 minutes.  Signed: Birdie Riddle 08/23/2022, 9:43 AM

## 2022-08-23 NOTE — Progress Notes (Addendum)
Patient got up bed alarm some how was off at the time found going to [facility] across the room with urine on the floor, pure wick in florr and SOB with activity.  Satff in the room immediately and education done about her fall risk level.  Bath preformed and pure wick removed at this time

## 2022-08-23 NOTE — Care Management Important Message (Signed)
Important Message  Patient Details  Name: Martha Mora MRN: [id number] Date of Birth: 1946/03/31   Medicare Important Message Given:  Yes     Orbie Pyo 08/23/2022, 2:52 PM

## 2022-08-23 NOTE — Progress Notes (Addendum)
Patient having increased expiratory wheezing with RR going to 30's at times may need a diuretic to pull some fluid since SOB with exertion and at rest.  Or potentially a blood gas needed with some increased working to breathe when going to the [facility] and forgetfulness to ring for nursing assistance. Will continue to monitor and teach deep breathing and pass onto next shift.

## 2022-08-23 NOTE — TOC Transition Note (Signed)
Transition of Care [facility]) - CM/SW Discharge Note   Patient Details  Name: Martha Mora MRN: [id number] Date of Birth: 05-16-1946  Transition of Care [facility]) CM/SW Contact:  Angelita Ingles, RN Phone Number:[phone]  08/23/2022, 12:01 PM   Clinical Narrative:    Patient with discharge order. No TOC needs noted         Patient Goals and CMS Choice        Discharge Placement                       Discharge Plan and Services                                     Social Determinants of Health (SDOH) Interventions Housing Interventions: Intervention Not Indicated   Readmission Risk Interventions    04/08/2022    2:59 PM 04/02/2022    2:42 PM 03/25/2022    4:51 PM  Readmission Risk Prevention Plan  Transportation Screening Complete Complete Complete  PCP or Specialist Appt within 3-5 Days Complete  Not Complete  Not Complete comments   Patient still urrently in ICU  [place] or [place] Complete  Complete  Social Work Consult for [place] Planning/Counseling Complete  Complete  Palliative Care Screening Not Applicable  Not Applicable  Medication Review [employer])  Referral to Pharmacy Referral to Pharmacy  PCP or Specialist appointment within 3-5 days of discharge  Complete   HRI or Home Care Consult  Complete   SW Recovery Care/Counseling Consult  Complete   Palliative Care Screening  Not [place]  Complete

## 2022-08-23 NOTE — Progress Notes (Signed)
Mobility Specialist Progress Note    08/23/22 1225  Mobility  Activity Refused mobility   Pt c/o back and hip pain and stated she doesn't want to go far from the potty.  Hildred Alamin Mobility Specialist

## 2022-08-23 NOTE — Plan of Care (Deleted)
  Problem: Education: Goal: Knowledge of General Education information will improve Description: Including pain rating scale, medication(s)/side effects and non-pharmacologic comfort measures Outcome: Progressing   Problem: Health Behavior/Discharge Planning: Goal: Ability to manage health-related needs will improve Outcome: Progressing   Problem: Nutrition: Goal: Adequate nutrition will be maintained Outcome: Progressing   Problem: Elimination: Goal: Will not experience complications related to bowel motility Outcome: Progressing   Problem: Pain Managment: Goal: General experience of comfort will improve Outcome: Progressing   

## 2022-08-23 NOTE — Plan of Care (Signed)
  Problem: Education: Goal: Knowledge of General Education information will improve Description: Including pain rating scale, medication(s)/side effects and non-pharmacologic comfort measures Outcome: Progressing   Problem: Health Behavior/Discharge Planning: Goal: Ability to manage health-related needs will improve Outcome: Progressing   Problem: Nutrition: Goal: Adequate nutrition will be maintained Outcome: Progressing   Problem: Elimination: Goal: Will not experience complications related to bowel motility Outcome: Progressing   Problem: Pain Managment: Goal: General experience of comfort will improve Outcome: Progressing   Problem: Education: Goal: Understanding of cardiac disease, CV risk reduction, and recovery process will improve Outcome: Progressing Goal: Individualized Educational Video(s) Outcome: Progressing   Problem: Activity: Goal: Ability to tolerate increased activity will improve Outcome: Progressing   Problem: Cardiac: Goal: Ability to achieve and maintain adequate cardiovascular perfusion will improve Outcome: Progressing   Problem: Activity: Goal: Ability to return to baseline activity level will improve Outcome: Progressing   Problem: Education: Goal: Ability to safely manage health related needs after discharge will improve Outcome: Progressing

## 2022-08-26 ENCOUNTER — Telehealth: Payer: Self-pay

## 2022-08-26 ENCOUNTER — Ambulatory Visit: Payer: Medicare Other

## 2022-08-26 ENCOUNTER — Encounter (INDEPENDENT_AMBULATORY_CARE_PROVIDER_SITE_OTHER): Payer: Medicare Other | Admitting: Ophthalmology

## 2022-08-26 NOTE — Patient Outreach (Signed)
  Care Coordination TOC Note Transition Care Management Unsuccessful Follow-up Telephone Call  Date of [facility] and from [facility]-08[facility]23/22 Attempts:  1st Attempt  Reason for unsuccessful TCM follow-up call:  Unable to leave message

## 2022-08-27 ENCOUNTER — Telehealth: Payer: Self-pay

## 2022-08-27 ENCOUNTER — Ambulatory Visit: Payer: Medicare Other

## 2022-08-27 NOTE — Patient Outreach (Signed)
  Care Coordination [facility] Note Transition Care Management Unsuccessful Follow-up Telephone Call  Date of [facility] and from [facility] 08/[facility]/22-08/23/22  Attempts:  2nd Attempt  Reason for unsuccessful TCM follow-up call:  Left voice message  Johnney Killian, RN, BSN, CCM Care Management Coordinator [facility] Phone: [phone]: [phone]

## 2022-08-28 ENCOUNTER — Encounter: Payer: Self-pay | Admitting: Student

## 2022-08-28 ENCOUNTER — Telehealth: Payer: Self-pay

## 2022-08-28 DIAGNOSIS — G2581 Restless legs syndrome: Secondary | ICD-10-CM

## 2022-08-28 NOTE — Patient Outreach (Signed)
  Care Coordination TOC Note Transition Care Management Unsuccessful Follow-up Telephone Call  Date of discharge and from where:  [facility] 08/20/22-08/23/22  Attempts:  3rd Attempt  Reason for unsuccessful TCM follow-up call:  No answer/busy

## 2022-08-28 NOTE — Patient Outreach (Signed)
  Care Coordination   08/28/2022 Name: Taren Dymek MRN: [id number] DOB: May 16, 1946   Care Coordination Outreach Attempts:  An unsuccessful telephone outreach was attempted today to offer the patient information about available care coordination services as a benefit of their health plan.   Follow Up Plan:  Additional outreach attempts will be made to offer the patient care coordination information and services.   Encounter Outcome:  No Answer  Care Coordination Interventions Activated:  No   Care Coordination Interventions:  No, not indicated

## 2022-08-29 NOTE — Telephone Encounter (Signed)
Patient calls nurse line regarding this concern. She reports approx 2 hours of sleep over the last three nights. She is prescribed trazodone, however, this is not helping her sleep. She states that she "feels like my insides are working overtime when I am trying to sleep."   Recommended that patient schedule follow up to discuss concerns further. Scheduled next available with PCP on 10/24. Please advise recommendations for patient until this appointment.   Talbot Grumbling, RN

## 2022-08-30 ENCOUNTER — Other Ambulatory Visit: Payer: Self-pay | Admitting: Student

## 2022-08-30 DIAGNOSIS — G2581 Restless legs syndrome: Secondary | ICD-10-CM

## 2022-08-30 NOTE — Telephone Encounter (Signed)
-----   Message from Gerrit Heck, MD sent at 08/23/2022 10:37 AM EDT ----- Can you schedule patient for follow up in one week for hospital f/u?

## 2022-08-31 ENCOUNTER — Telehealth: Payer: Self-pay | Admitting: Family Medicine

## 2022-08-31 NOTE — Telephone Encounter (Signed)
After-hours/emergency line call  Patient calls after-hours line reporting that she has had worsening restless legs and that she has had difficulty sleeping as a result.  She knows she has an appointment with PCP on 10/24 but she is asking if there is anything that can be done in the meantime until her appointment.  I informed her that I will route a message to her PCP to see if she had any recommendations.

## 2022-08-31 NOTE — Telephone Encounter (Signed)
After-hours/emergency line call  Patient calls after-hours line for a second time for the same issue.  She did not remember what I had discussed with her previously about letting her PCP know.  She expresses frustration and requests a call from PCP whenever possible.

## 2022-09-01 DIAGNOSIS — R0600 Dyspnea, unspecified: Secondary | ICD-10-CM | POA: Diagnosis not present

## 2022-09-01 DIAGNOSIS — R0902 Hypoxemia: Secondary | ICD-10-CM | POA: Diagnosis not present

## 2022-09-01 DIAGNOSIS — R531 Weakness: Secondary | ICD-10-CM | POA: Diagnosis not present

## 2022-09-01 DIAGNOSIS — J449 Chronic obstructive pulmonary disease, unspecified: Secondary | ICD-10-CM | POA: Diagnosis not present

## 2022-09-02 ENCOUNTER — Ambulatory Visit: Payer: Medicare Other | Admitting: Nurse Practitioner

## 2022-09-02 ENCOUNTER — Encounter: Payer: Self-pay | Admitting: Nurse Practitioner

## 2022-09-02 ENCOUNTER — Ambulatory Visit (INDEPENDENT_AMBULATORY_CARE_PROVIDER_SITE_OTHER): Payer: Medicare Other | Admitting: Pulmonary Disease

## 2022-09-02 VITALS — BP 136/60 | HR 70 | Temp 98.4°F | Ht 60.0 in | Wt 233.0 lb

## 2022-09-02 DIAGNOSIS — J432 Centrilobular emphysema: Secondary | ICD-10-CM

## 2022-09-02 DIAGNOSIS — J9611 Chronic respiratory failure with hypoxia: Secondary | ICD-10-CM | POA: Diagnosis not present

## 2022-09-02 DIAGNOSIS — Z720 Tobacco use: Secondary | ICD-10-CM | POA: Diagnosis not present

## 2022-09-02 DIAGNOSIS — D509 Iron deficiency anemia, unspecified: Secondary | ICD-10-CM

## 2022-09-02 DIAGNOSIS — R0600 Dyspnea, unspecified: Secondary | ICD-10-CM | POA: Diagnosis not present

## 2022-09-02 DIAGNOSIS — I4891 Unspecified atrial fibrillation: Secondary | ICD-10-CM

## 2022-09-02 DIAGNOSIS — I5032 Chronic diastolic (congestive) heart failure: Secondary | ICD-10-CM

## 2022-09-02 DIAGNOSIS — Z87891 Personal history of nicotine dependence: Secondary | ICD-10-CM

## 2022-09-02 DIAGNOSIS — J9612 Chronic respiratory failure with hypercapnia: Secondary | ICD-10-CM

## 2022-09-02 DIAGNOSIS — G4733 Obstructive sleep apnea (adult) (pediatric): Secondary | ICD-10-CM | POA: Diagnosis not present

## 2022-09-02 LAB — PULMONARY FUNCTION TEST
DL/VA % pred: 70 %
DL/VA: 2.97 ml/min/mmHg/L
DLCO cor % pred: 64 %
DLCO cor: 10.61 ml/min/mmHg
DLCO unc % pred: 53 %
DLCO unc: 8.78 ml/min/mmHg
FEF 25-75 Post: 0.65 L/sec
FEF 25-75 Pre: 1.03 L/sec
FEF2575-%Change-Post: -36 %
FEF2575-%Pred-Post: 50 %
FEF2575-%Pred-Pre: 79 %
FEV1-%Change-Post: -8 %
FEV1-%Pred-Post: 75 %
FEV1-%Pred-Pre: 82 %
FEV1-Post: 1.24 L
FEV1-Pre: 1.36 L
FEV1FVC-%Change-Post: 4 %
FEV1FVC-%Pred-Pre: 102 %
FEV6-%Change-Post: -11 %
FEV6-%Pred-Post: 75 %
FEV6-%Pred-Pre: 85 %
FEV6-Post: 1.57 L
FEV6-Pre: 1.78 L
FEV6FVC-%Pred-Post: 106 %
FEV6FVC-%Pred-Pre: 106 %
FVC-%Change-Post: -11 %
FVC-%Pred-Post: 70 %
FVC-%Pred-Pre: 80 %
FVC-Post: 1.57 L
FVC-Pre: 1.78 L
Post FEV1/FVC ratio: 79 %
Post FEV6/FVC ratio: 100 %
Pre FEV1/FVC ratio: 76 %
Pre FEV6/FVC Ratio: 100 %

## 2022-09-02 MED ORDER — SPIRIVA RESPIMAT 2.5 MCG/ACT IN AERS: 2.0000 | INHALATION_SPRAY | Freq: Every day | RESPIRATORY_TRACT | 5 refills | Status: DC

## 2022-09-02 MED ORDER — LEVALBUTEROL HCL 1.25 MG/3ML IN NEBU: 1.2500 mg | INHALATION_SOLUTION | RESPIRATORY_TRACT | 12 refills | Status: DC | PRN

## 2022-09-02 MED ORDER — LEVALBUTEROL HCL 0.63 MG/3ML IN NEBU: 0.6300 mg | INHALATION_SOLUTION | Freq: Four times a day (QID) | RESPIRATORY_TRACT | 3 refills | Status: DC | PRN

## 2022-09-02 NOTE — Assessment & Plan Note (Addendum)
Mild emphysematous changes on imaging. PFTs without formal obstruction. Struggling with significant dyspnea, which is multifactorial. She does have good benefit from SABA so recommended we trial her on scheduled bronchodilator. Will start LAMA therapy with Spiriva 2 puffs daily.   Patient Instructions  Continue Albuterol inhaler 2 puffs every 6 hours as needed for shortness of breath or wheezing. Notify if symptoms persist despite rescue inhaler/neb use.  Continue claritin 1 tab daily for allergies Continue protonix 1 tab daily for reflux Continue supplemental oxygen 2 lpm for goal oxygen >88-90%  Trial Spiriva 2 puffs daily  Xopenex 3 mL neb every 6 hours as needed for shortness of breath or wheezing Split night study ordered - someone will contact you for scheduling   Activity encouraged, as tolerated. Work on healthy weight loss measures. Your weight is negatively impacting your breathing and causing strain on your heart and lungs. Referred to medical weight management  We discussed how untreated sleep apnea puts an individual at risk for cardiac arrhthymias, pulm HTN, DM, stroke and increases their risk for daytime accidents.   Follow up with your heart doctor as scheduled   Referral to the lung cancer screening program   You need to quit vaping!   Follow up after sleep study in 4-6 weeks to discuss results with Dr. Lake Bells or Alanson Aly. If symptoms do not improve or worsen, please contact office for sooner follow up or seek emergency care.

## 2022-09-02 NOTE — Assessment & Plan Note (Signed)
Untreated OSA; previously mild to moderate with AHI 13.2 from 2016. She has had a 30 lb weight gain since. We discussed the impact that untreated OSA can have on her respiratory and cardiac problems. We will repeat split night study for further evaluation.
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
## 2022-09-02 NOTE — Assessment & Plan Note (Signed)
Stable on supplemental oxygen 2 lpm. She was unable to qualify for POC; however, she had just gotten out of the hospital post a fib RVR and CAD requiring stent placement. Advised her that we could try walking her on POC again in the future once she's better recovered. Goal >88-90%.

## 2022-09-02 NOTE — Assessment & Plan Note (Signed)
Anemia with hgb 8.9 from 10/6. Currently on iron supplementation. Follow up with heme/onc as scheduled.

## 2022-09-02 NOTE — Assessment & Plan Note (Signed)
Regular rhythm and rate controlled today. Currently on amiodarone- closely monitor lung function. Anticoagulated with Eliquis. Follow up with cardiology as scheduled.

## 2022-09-02 NOTE — Progress Notes (Signed)
Full PFT Performed Today  

## 2022-09-02 NOTE — Assessment & Plan Note (Signed)
Lengthy discussion surrounding her weight, associated negative impacts on her health, and correlation to current symptoms. Mild restrictive defect on PFTs with reduced ERV related to her current body habitus. She would like to work on healthy weight loss measures. Referred to medical weight management for further assistance.

## 2022-09-02 NOTE — Patient Instructions (Signed)
Full PFT Performed Today  

## 2022-09-02 NOTE — Assessment & Plan Note (Signed)
57 pack year history; quit 2018. Currently vaping. Educated on risks of vaping and associated lung injury. Cessation strongly advised. She has nicotine patches at home, which she will start using. Previously followed by lung cancer screening program; last CT chest was May 2022. Referral replaced today.

## 2022-09-02 NOTE — Assessment & Plan Note (Signed)
Compensated on exam today. Recent echo with preserved EF and G1DD. Follow up with cardiology as scheduled.

## 2022-09-02 NOTE — Patient Instructions (Addendum)
Continue Albuterol inhaler 2 puffs every 6 hours as needed for shortness of breath or wheezing. Notify if symptoms persist despite rescue inhaler/neb use.  Continue claritin 1 tab daily for allergies Continue protonix 1 tab daily for reflux Continue supplemental oxygen 2 lpm for goal oxygen >88-90%  Trial Spiriva 2 puffs daily  Xopenex 3 mL neb every 6 hours as needed for shortness of breath or wheezing Split night study ordered - someone will contact you for scheduling   Activity encouraged, as tolerated. Work on healthy weight loss measures. Your weight is negatively impacting your breathing and causing strain on your heart and lungs. Referred to medical weight management  We discussed how untreated sleep apnea puts an individual at risk for cardiac arrhthymias, pulm HTN, DM, stroke and increases their risk for daytime accidents.   Follow up with your heart doctor as scheduled   Referral to the lung cancer screening program   You need to quit vaping!   Follow up after sleep study in 4-6 weeks to discuss results with Martha Mora or Martha Mora. If symptoms do not improve or worsen, please contact office for sooner follow up or seek emergency care.

## 2022-09-03 DIAGNOSIS — R0902 Hypoxemia: Secondary | ICD-10-CM | POA: Diagnosis not present

## 2022-09-03 DIAGNOSIS — R0600 Dyspnea, unspecified: Secondary | ICD-10-CM | POA: Diagnosis not present

## 2022-09-03 DIAGNOSIS — R531 Weakness: Secondary | ICD-10-CM | POA: Diagnosis not present

## 2022-09-03 DIAGNOSIS — J449 Chronic obstructive pulmonary disease, unspecified: Secondary | ICD-10-CM | POA: Diagnosis not present

## 2022-09-03 NOTE — Progress Notes (Signed)
Reviewed, agree 

## 2022-09-04 ENCOUNTER — Encounter (INDEPENDENT_AMBULATORY_CARE_PROVIDER_SITE_OTHER): Payer: Medicare Other | Admitting: Ophthalmology

## 2022-09-04 DIAGNOSIS — H353231 Exudative age-related macular degeneration, bilateral, with active choroidal neovascularization: Secondary | ICD-10-CM | POA: Diagnosis not present

## 2022-09-04 DIAGNOSIS — I10 Essential (primary) hypertension: Secondary | ICD-10-CM | POA: Diagnosis not present

## 2022-09-04 DIAGNOSIS — H35033 Hypertensive retinopathy, bilateral: Secondary | ICD-10-CM

## 2022-09-04 DIAGNOSIS — H33302 Unspecified retinal break, left eye: Secondary | ICD-10-CM | POA: Diagnosis not present

## 2022-09-04 DIAGNOSIS — H43813 Vitreous degeneration, bilateral: Secondary | ICD-10-CM

## 2022-09-05 ENCOUNTER — Other Ambulatory Visit: Payer: Self-pay | Admitting: Student

## 2022-09-05 DIAGNOSIS — G2581 Restless legs syndrome: Secondary | ICD-10-CM

## 2022-09-05 MED ORDER — PREGABALIN 75 MG PO CAPS: 150.0000 mg | ORAL_CAPSULE | Freq: Two times a day (BID) | ORAL | 0 refills | Status: DC

## 2022-09-09 DIAGNOSIS — I2510 Atherosclerotic heart disease of native coronary artery without angina pectoris: Secondary | ICD-10-CM | POA: Diagnosis not present

## 2022-09-09 DIAGNOSIS — R072 Precordial pain: Secondary | ICD-10-CM | POA: Diagnosis not present

## 2022-09-09 DIAGNOSIS — I10 Essential (primary) hypertension: Secondary | ICD-10-CM | POA: Diagnosis not present

## 2022-09-09 DIAGNOSIS — Z9861 Coronary angioplasty status: Secondary | ICD-10-CM | POA: Diagnosis not present

## 2022-09-10 ENCOUNTER — Other Ambulatory Visit: Payer: Self-pay

## 2022-09-10 ENCOUNTER — Ambulatory Visit: Payer: Medicare Other | Admitting: Student

## 2022-09-10 MED ORDER — PREGABALIN 150 MG PO CAPS: 150.0000 mg | ORAL_CAPSULE | Freq: Two times a day (BID) | ORAL | 0 refills | Status: DC

## 2022-09-10 MED ORDER — APIXABAN 5 MG PO TABS: 5.0000 mg | ORAL_TABLET | Freq: Two times a day (BID) | ORAL | 0 refills | Status: DC

## 2022-09-10 NOTE — Patient Instructions (Incomplete)
It was great to see you! Thank you for allowing me to participate in your care!   Our plans for today:  - Please schedule follow up with cardiology -   Take care and seek immediate care sooner if you develop any concerns.  Gerrit Heck, MD

## 2022-09-10 NOTE — Progress Notes (Deleted)
    SUBJECTIVE:   CHIEF COMPLAINT / HPI: Hosp f/u  Hospital f/u 10/3-10/6 Recent hospitalization for ACS and Afib RVR with cardiac cath of RCA and successful cardioversion. Recommendations on D/c Summary of Aspirin, Plavix and Eliquis x 1 month then she will continue Eliquis and Plavix for 1 year and then back to Eliquis only. Denies any chest pain***  RLS  Insomnia Anxiety plays in as well at night. Currently on buspar 15 mg, cymbalta 60 mg, ferrous sulfate, lyric 75 mg BID, trazadone.    PERTINENT  PMH / PSH: Afib  OBJECTIVE:   There were no vitals taken for this visit.  ***  ASSESSMENT/PLAN:   No problem-specific Assessment & Plan notes found for this encounter.     Gerrit Heck, MD [place]

## 2022-09-10 NOTE — Addendum Note (Signed)
Addended by: Gerrit Heck on: 09/10/2022 12:37 PM   Modules accepted: Orders

## 2022-09-13 ENCOUNTER — Other Ambulatory Visit (HOSPITAL_COMMUNITY): Payer: Self-pay

## 2022-09-14 ENCOUNTER — Other Ambulatory Visit (HOSPITAL_COMMUNITY): Payer: Self-pay

## 2022-09-17 ENCOUNTER — Telehealth: Payer: Self-pay

## 2022-09-17 MED ORDER — APIXABAN 5 MG PO TABS: 5.0000 mg | ORAL_TABLET | Freq: Two times a day (BID) | ORAL | 0 refills | Status: DC

## 2022-09-17 NOTE — Telephone Encounter (Signed)
See mychart message.   Eliquis cancelled at [facility]. Resent to [facility] per patient request.

## 2022-09-18 ENCOUNTER — Other Ambulatory Visit (HOSPITAL_COMMUNITY): Payer: Self-pay

## 2022-09-19 ENCOUNTER — Ambulatory Visit: Payer: Medicare Other | Admitting: Student

## 2022-09-23 ENCOUNTER — Other Ambulatory Visit: Payer: Self-pay | Admitting: Student

## 2022-09-23 DIAGNOSIS — J9611 Chronic respiratory failure with hypoxia: Secondary | ICD-10-CM

## 2022-09-30 ENCOUNTER — Telehealth: Payer: Self-pay | Admitting: *Deleted

## 2022-09-30 ENCOUNTER — Encounter: Payer: Self-pay | Admitting: *Deleted

## 2022-09-30 ENCOUNTER — Encounter (HOSPITAL_BASED_OUTPATIENT_CLINIC_OR_DEPARTMENT_OTHER): Payer: Medicare Other | Admitting: Pulmonary Disease

## 2022-09-30 NOTE — Telephone Encounter (Signed)
Left message for patient to call back to schedule follow up lung cancer screening CT scan. Also sent letter via [place].

## 2022-10-02 ENCOUNTER — Encounter (INDEPENDENT_AMBULATORY_CARE_PROVIDER_SITE_OTHER): Payer: Medicare Other | Admitting: Ophthalmology

## 2022-10-02 ENCOUNTER — Other Ambulatory Visit (HOSPITAL_COMMUNITY): Payer: Self-pay

## 2022-10-02 ENCOUNTER — Encounter (INDEPENDENT_AMBULATORY_CARE_PROVIDER_SITE_OTHER): Payer: Self-pay

## 2022-10-02 ENCOUNTER — Ambulatory Visit: Payer: Medicare Other

## 2022-10-02 DIAGNOSIS — J449 Chronic obstructive pulmonary disease, unspecified: Secondary | ICD-10-CM | POA: Diagnosis not present

## 2022-10-02 DIAGNOSIS — R0902 Hypoxemia: Secondary | ICD-10-CM | POA: Diagnosis not present

## 2022-10-02 DIAGNOSIS — R531 Weakness: Secondary | ICD-10-CM | POA: Diagnosis not present

## 2022-10-02 DIAGNOSIS — R0600 Dyspnea, unspecified: Secondary | ICD-10-CM | POA: Diagnosis not present

## 2022-10-03 ENCOUNTER — Ambulatory Visit: Payer: Medicare Other

## 2022-10-03 ENCOUNTER — Other Ambulatory Visit (HOSPITAL_COMMUNITY): Payer: Self-pay

## 2022-10-04 DIAGNOSIS — J449 Chronic obstructive pulmonary disease, unspecified: Secondary | ICD-10-CM | POA: Diagnosis not present

## 2022-10-04 DIAGNOSIS — R0902 Hypoxemia: Secondary | ICD-10-CM | POA: Diagnosis not present

## 2022-10-04 DIAGNOSIS — R531 Weakness: Secondary | ICD-10-CM | POA: Diagnosis not present

## 2022-10-04 DIAGNOSIS — R0600 Dyspnea, unspecified: Secondary | ICD-10-CM | POA: Diagnosis not present

## 2022-10-12 ENCOUNTER — Other Ambulatory Visit: Payer: Self-pay | Admitting: Hematology

## 2022-10-13 ENCOUNTER — Other Ambulatory Visit: Payer: Self-pay | Admitting: Student

## 2022-10-18 ENCOUNTER — Encounter (INDEPENDENT_AMBULATORY_CARE_PROVIDER_SITE_OTHER): Payer: Medicare Other | Admitting: Ophthalmology

## 2022-10-21 ENCOUNTER — Encounter (INDEPENDENT_AMBULATORY_CARE_PROVIDER_SITE_OTHER): Payer: Medicare Other | Admitting: Ophthalmology

## 2022-10-21 DIAGNOSIS — H35033 Hypertensive retinopathy, bilateral: Secondary | ICD-10-CM | POA: Diagnosis not present

## 2022-10-21 DIAGNOSIS — I10 Essential (primary) hypertension: Secondary | ICD-10-CM

## 2022-10-21 DIAGNOSIS — H33302 Unspecified retinal break, left eye: Secondary | ICD-10-CM | POA: Diagnosis not present

## 2022-10-21 DIAGNOSIS — H353231 Exudative age-related macular degeneration, bilateral, with active choroidal neovascularization: Secondary | ICD-10-CM

## 2022-10-21 DIAGNOSIS — H43813 Vitreous degeneration, bilateral: Secondary | ICD-10-CM | POA: Diagnosis not present

## 2022-10-23 ENCOUNTER — Other Ambulatory Visit: Payer: Self-pay | Admitting: Student

## 2022-10-23 DIAGNOSIS — G2581 Restless legs syndrome: Secondary | ICD-10-CM

## 2022-10-24 ENCOUNTER — Inpatient Hospital Stay: Payer: Medicare Other | Attending: Nurse Practitioner | Admitting: Nurse Practitioner

## 2022-10-24 ENCOUNTER — Other Ambulatory Visit: Payer: Self-pay | Admitting: Nurse Practitioner

## 2022-10-24 ENCOUNTER — Other Ambulatory Visit: Payer: Self-pay | Admitting: Student

## 2022-10-24 ENCOUNTER — Inpatient Hospital Stay: Payer: Medicare Other

## 2022-10-24 DIAGNOSIS — G2581 Restless legs syndrome: Secondary | ICD-10-CM

## 2022-10-24 DIAGNOSIS — C50511 Malignant neoplasm of lower-outer quadrant of right female breast: Secondary | ICD-10-CM

## 2022-10-24 MED ORDER — PREGABALIN 150 MG PO CAPS: 150.0000 mg | ORAL_CAPSULE | Freq: Two times a day (BID) | ORAL | 0 refills | Status: DC
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
## 2022-10-25 ENCOUNTER — Telehealth (INDEPENDENT_AMBULATORY_CARE_PROVIDER_SITE_OTHER): Payer: Medicare Other | Admitting: Student

## 2022-10-25 ENCOUNTER — Ambulatory Visit: Payer: Medicare Other | Admitting: Student

## 2022-10-25 DIAGNOSIS — F410 Panic disorder [episodic paroxysmal anxiety] without agoraphobia: Secondary | ICD-10-CM

## 2022-10-25 NOTE — Assessment & Plan Note (Signed)
Patient having panic attacks nearly every day mostly related to health-related concerns.  I discussed I am hesitant to start a benzodiazepine for her given her age and history of respiratory failure.  There is also documentation that Seroquel had caused her possible akathisia and I would like to avoid this at the moment.  I discussed that she should start taking her trazodone at night to help her with sleep.  I had recently increased her Lyrica to twice daily dosing for restless leg syndrome.  She is on many medications and given age and comorbidities I discussed plan with Dr. Andria Frames in depth and discussed with patient that the best course of action is for her to have the specialist's opinion on treatment therapy for her. -Referral to psychiatry -Discussed starting trazodone again patient has supply at home

## 2022-10-25 NOTE — Progress Notes (Signed)
    SUBJECTIVE:   [place] Telemedicine Visit  Patient consented to have virtual visit and was identified by name and date of birth. Method of visit: Telephone  Encounter participants: Patient: Martha Mora - located at home Provider: Gerrit Heck - located at [facility] office  Chief Complaint: Panic Attacks  HPI:  Anxiety  Panic Attacks Martha Mora let me know that since 76 years old she has been having panic attacks and anxiety.  She said that ever since her husband died 5 years ago it has been much worse.  She says that most recently she has been having panic attacks almost every day.  She says that things that trigger her are being alone and also being afraid to go back to the hospital/being sick.  She said that yesterday she started to have a nosebleed at home that did not stop for a couple of hours.  She has been using humidified oxygen and otherwise feeling okay but this had reminded her of her previous hospitalization where she had a nosebleed and subsequently got intubated and had to stay in the ICU.  She said that she had taken her daughter's Xanax 1 time and that this had helped her a lot.  She denies any history of schizophrenia and says she has never had a diagnosis of bipolar disorder.  She is currently taking BuSpar 15 mg 3 times daily, duloxetine 60 mg and metoprolol 12.5 twice daily (for A-fib).  She has not been taking her trazodone at night.  She says when she is alone she goes into total panic and starts to feel short of breath, heart starts racing and she gets afraid that she will have to go back to the emergency department.  She does not see any therapist or psychiatrist.  Per chart review she has had issues with Seroquel and possible akathisia.  She also said the other day that her medication cost was $500 for eliquis [facility] and she was afraid that she did not have any money.  [facility].  ROS: per HPI  Pertinent PMHx: A-fib on Eliquis  Exam:  There were no vitals taken for this visit.  Telephone visit  Assessment/Plan:  Panic attacks Patient having panic attacks nearly every day mostly related to health-related concerns.  I discussed I am hesitant to start a benzodiazepine for her given her age and history of respiratory failure.  There is also documentation that Seroquel had caused her possible akathisia and I would like to avoid this at the moment.  I discussed that she should start taking her trazodone at night to help her with sleep.  I had recently increased her Lyrica to twice daily dosing for restless leg syndrome.  She is on many medications and given age and comorbidities I discussed plan with Dr. Andria Frames in depth and discussed with patient that the best course of action is for her to have the specialist's opinion on treatment therapy for her. -Referral to psychiatry -Discussed starting trazodone again patient has supply at home   Time spent during visit with patient: 30 minutes

## 2022-10-25 NOTE — Patient Instructions (Signed)
It was great to see you! Thank you for allowing me to participate in your care!   Our plans for today:  -I am referring you to psychiatry for better control -Start taking your trazodone at night again  Take care and seek immediate care sooner if you develop any concerns.  Gerrit Heck, MD

## 2022-10-31 ENCOUNTER — Ambulatory Visit (INDEPENDENT_AMBULATORY_CARE_PROVIDER_SITE_OTHER): Payer: Medicare Other | Admitting: Family Medicine

## 2022-10-31 VITALS — BP 133/80 | HR 75 | Temp 98.7°F | Ht 60.0 in | Wt 237.8 lb

## 2022-10-31 DIAGNOSIS — L989 Disorder of the skin and subcutaneous tissue, unspecified: Secondary | ICD-10-CM

## 2022-10-31 DIAGNOSIS — L570 Actinic keratosis: Secondary | ICD-10-CM | POA: Diagnosis not present

## 2022-10-31 NOTE — Patient Instructions (Signed)
Good to see you today - Thank you for coming in  Things we discussed today:  1) Your face lesion could be Basal Cell Carcinoma, a type of skin cancer - We will send a referral for you to see a dermatologist that can remove it with minimal scarring  2) Your lesions on your Left hand were most likely Actinic Keratosis, a precancerous lesion - We froze them off. You may develop blistering in the area and that is normal/

## 2022-10-31 NOTE — Progress Notes (Signed)
de   SUBJECTIVE:   CHIEF COMPLAINT / HPI:   Martha Mora is a 76yo F h/f a skin lesion on face and hand.  Facial Lesion Lesion on L cheek has been on for a few years and asymptomatic, no bleeding or itching or pain. But she recently started oxygen therapy, the [place] tube rubs on it and causes irritation and itching. It bleeds when she picks at it.  2 Hand lesions of L hand Also lesions on L hand dorsum. Previously had similar lesion on R hand that was removed in our office. Wants to have it removed.  Is on Eliquis, and baby ASA  No hx of skin cancer  Has hx of breast cancer.   PERTINENT  PMH / PSH: CHF, HTN, CAD,   OBJECTIVE:   BP 133/80   Pulse 75   Temp 98.7 F (37.1 C)   Ht 5' (1.524 m)   Wt 237 lb 12.8 oz (107.9 kg)   SpO2 (!) 88% Comment: patient is on 2.5 liters of oxygen  BMI 46.44 kg/m   Gen: Pleasant, alert older woman. NAD HEENT: NCAT. MMM, Derm: Flesh colored flat skin lesion, ~72m, telangectasias, rolled edges on L cheek. 2 hyperpigmented, raised, crusty feeling lesions on L hand dorsum.      ASSESSMENT/PLAN:   Actinic keratosis 2 lesions on dorsum of L hand. Appearance is most c/w actinic keratosis. Pt requests it be frozen off.  Consent obtained for cryotherapy. Froze and thawed both lesions x3. Wound care instructions provided.  Facial skin lesion Has years hx of skin lesion on L cheek. Since starting O2 therapy, tubing has irritated it and it sometimes bleeds when she picks it. ~547min size, telangectasias, and rolled edges raise concern for potential BCC. Givent hat pt is on Eliquis, ASA, and cosmetically-relevant location of lesion, decision was made to defer biopsy or removal in our office.  - Referral sent for dermatology for management of L facial cheek lesion   JaArlyce DiceMD [place]

## 2022-11-01 DIAGNOSIS — R0600 Dyspnea, unspecified: Secondary | ICD-10-CM | POA: Diagnosis not present

## 2022-11-01 DIAGNOSIS — J449 Chronic obstructive pulmonary disease, unspecified: Secondary | ICD-10-CM | POA: Diagnosis not present

## 2022-11-01 DIAGNOSIS — R531 Weakness: Secondary | ICD-10-CM | POA: Diagnosis not present

## 2022-11-01 DIAGNOSIS — R0902 Hypoxemia: Secondary | ICD-10-CM | POA: Diagnosis not present

## 2022-11-01 NOTE — Assessment & Plan Note (Signed)
2 lesions on dorsum of L hand. Appearance is most c/w actinic keratosis. Pt requests it be frozen off.  Consent obtained for cryotherapy. Froze and thawed both lesions x3. Wound care instructions provided.

## 2022-11-01 NOTE — Assessment & Plan Note (Signed)
Has years hx of skin lesion on L cheek. Since starting O2 therapy, tubing has irritated it and it sometimes bleeds when she picks it. ~32m in size, telangectasias, and rolled edges raise concern for potential BCC. Givent hat pt is on Eliquis, ASA, and cosmetically-relevant location of lesion, decision was made to defer biopsy or removal in our office.  - Referral sent for dermatology for management of L facial cheek lesion

## 2022-11-03 DIAGNOSIS — R0902 Hypoxemia: Secondary | ICD-10-CM | POA: Diagnosis not present

## 2022-11-03 DIAGNOSIS — R0600 Dyspnea, unspecified: Secondary | ICD-10-CM | POA: Diagnosis not present

## 2022-11-03 DIAGNOSIS — J449 Chronic obstructive pulmonary disease, unspecified: Secondary | ICD-10-CM | POA: Diagnosis not present

## 2022-11-03 DIAGNOSIS — R531 Weakness: Secondary | ICD-10-CM | POA: Diagnosis not present

## 2022-11-04 ENCOUNTER — Telehealth: Payer: Self-pay

## 2022-11-04 NOTE — Patient Outreach (Signed)
  Care Coordination   11/04/2022 Name: Martha Mora MRN: [id number] DOB: [birth year]   Care Coordination Outreach Attempts:  An unsuccessful telephone outreach was attempted today to offer the patient information about available care coordination services as a benefit of their health plan.   Follow Up Plan:  Additional outreach attempts will be made to offer the patient care coordination information and services.   Encounter Outcome:  No Answer   Care Coordination Interventions:  No, not indicated    Jone Baseman, RN, MSN [place] [facility] Care Management Coordinator Direct Line [phone]

## 2022-11-19 ENCOUNTER — Telehealth: Payer: Self-pay

## 2022-11-19 NOTE — Patient Outreach (Signed)
  Care Coordination   11/19/2022 Name: Madolyn Ackroyd MRN: [id number] DOB: Sep 24, 1946   Care Coordination Outreach Attempts:  A second unsuccessful outreach was attempted today to offer the patient with information about available care coordination services as a benefit of their health plan.     Follow Up Plan:  Additional outreach attempts will be made to offer the patient care coordination information and services.   Encounter Outcome:  No Answer   Care Coordination Interventions:  No, not indicated    Jone Baseman, RN, MSN [place] [facility] Care Management Coordinator Direct Line [phone]

## 2022-11-20 ENCOUNTER — Ambulatory Visit: Payer: Medicare Other

## 2022-11-21 ENCOUNTER — Encounter (INDEPENDENT_AMBULATORY_CARE_PROVIDER_SITE_OTHER): Payer: Medicare Other | Admitting: Ophthalmology

## 2022-11-21 DIAGNOSIS — H33302 Unspecified retinal break, left eye: Secondary | ICD-10-CM | POA: Diagnosis not present

## 2022-11-21 DIAGNOSIS — H35033 Hypertensive retinopathy, bilateral: Secondary | ICD-10-CM

## 2022-11-21 DIAGNOSIS — H353231 Exudative age-related macular degeneration, bilateral, with active choroidal neovascularization: Secondary | ICD-10-CM

## 2022-11-21 DIAGNOSIS — I10 Essential (primary) hypertension: Secondary | ICD-10-CM

## 2022-11-21 DIAGNOSIS — H43813 Vitreous degeneration, bilateral: Secondary | ICD-10-CM | POA: Diagnosis not present

## 2022-11-25 ENCOUNTER — Other Ambulatory Visit: Payer: Self-pay | Admitting: Student

## 2022-11-25 DIAGNOSIS — G2581 Restless legs syndrome: Secondary | ICD-10-CM

## 2022-12-02 DIAGNOSIS — R0600 Dyspnea, unspecified: Secondary | ICD-10-CM | POA: Diagnosis not present

## 2022-12-02 DIAGNOSIS — R531 Weakness: Secondary | ICD-10-CM | POA: Diagnosis not present

## 2022-12-02 DIAGNOSIS — J449 Chronic obstructive pulmonary disease, unspecified: Secondary | ICD-10-CM | POA: Diagnosis not present

## 2022-12-02 DIAGNOSIS — R0902 Hypoxemia: Secondary | ICD-10-CM | POA: Diagnosis not present

## 2022-12-04 ENCOUNTER — Other Ambulatory Visit: Payer: Self-pay | Admitting: Student

## 2022-12-04 ENCOUNTER — Telehealth: Payer: Self-pay

## 2022-12-04 DIAGNOSIS — R531 Weakness: Secondary | ICD-10-CM | POA: Diagnosis not present

## 2022-12-04 DIAGNOSIS — J449 Chronic obstructive pulmonary disease, unspecified: Secondary | ICD-10-CM | POA: Diagnosis not present

## 2022-12-04 DIAGNOSIS — R0902 Hypoxemia: Secondary | ICD-10-CM | POA: Diagnosis not present

## 2022-12-04 DIAGNOSIS — R0600 Dyspnea, unspecified: Secondary | ICD-10-CM | POA: Diagnosis not present

## 2022-12-04 MED ORDER — ROSUVASTATIN CALCIUM 20 MG PO TABS: 20.0000 mg | ORAL_TABLET | Freq: Every day | ORAL | 1 refills | Status: DC

## 2022-12-04 NOTE — Patient Outreach (Signed)
  Care Coordination   12/04/2022 Name: Martha Mora MRN: [id number] DOB: 1946-05-01   Care Coordination Outreach Attempts:  A third unsuccessful outreach was attempted today to offer the patient with information about available care coordination services as a benefit of their health plan.   Follow Up Plan:  No further outreach attempts will be made at this time. We have been unable to contact the patient to offer or enroll patient in care coordination services  Encounter Outcome:  No Answer   Care Coordination Interventions:  No, not indicated    Jone Baseman, RN, MSN [place] [facility] Care Management Coordinator Direct Line [phone]

## 2022-12-16 ENCOUNTER — Ambulatory Visit: Payer: Medicare Other

## 2022-12-17 ENCOUNTER — Other Ambulatory Visit: Payer: Self-pay | Admitting: Family Medicine

## 2022-12-17 ENCOUNTER — Other Ambulatory Visit: Payer: Self-pay | Admitting: Hematology

## 2022-12-19 ENCOUNTER — Encounter (INDEPENDENT_AMBULATORY_CARE_PROVIDER_SITE_OTHER): Payer: Medicare Other | Admitting: Ophthalmology

## 2022-12-23 ENCOUNTER — Other Ambulatory Visit: Payer: Self-pay | Admitting: Student

## 2022-12-23 ENCOUNTER — Telehealth: Payer: Self-pay | Admitting: Nurse Practitioner

## 2022-12-23 ENCOUNTER — Encounter (INDEPENDENT_AMBULATORY_CARE_PROVIDER_SITE_OTHER): Payer: Medicare Other | Admitting: Ophthalmology

## 2022-12-23 DIAGNOSIS — F419 Anxiety disorder, unspecified: Secondary | ICD-10-CM

## 2022-12-23 MED ORDER — BUSPIRONE HCL 15 MG PO TABS: 15.0000 mg | ORAL_TABLET | Freq: Three times a day (TID) | ORAL | 1 refills | Status: DC

## 2022-12-23 NOTE — Telephone Encounter (Signed)
Per 1/31 IB, called pt but VM is full, will send calendar

## 2022-12-26 ENCOUNTER — Other Ambulatory Visit: Payer: Self-pay | Admitting: Family Medicine

## 2022-12-26 DIAGNOSIS — G4700 Insomnia, unspecified: Secondary | ICD-10-CM

## 2022-12-29 ENCOUNTER — Other Ambulatory Visit: Payer: Self-pay | Admitting: Student

## 2022-12-29 DIAGNOSIS — J9611 Chronic respiratory failure with hypoxia: Secondary | ICD-10-CM

## 2022-12-29 DIAGNOSIS — G2581 Restless legs syndrome: Secondary | ICD-10-CM

## 2022-12-31 ENCOUNTER — Encounter (INDEPENDENT_AMBULATORY_CARE_PROVIDER_SITE_OTHER): Payer: Medicare Other | Admitting: Ophthalmology

## 2023-01-01 DIAGNOSIS — L821 Other seborrheic keratosis: Secondary | ICD-10-CM | POA: Diagnosis not present

## 2023-01-01 DIAGNOSIS — C44319 Basal cell carcinoma of skin of other parts of face: Secondary | ICD-10-CM | POA: Diagnosis not present

## 2023-01-01 DIAGNOSIS — L570 Actinic keratosis: Secondary | ICD-10-CM | POA: Diagnosis not present

## 2023-01-01 DIAGNOSIS — D0439 Carcinoma in situ of skin of other parts of face: Secondary | ICD-10-CM | POA: Diagnosis not present

## 2023-01-02 DIAGNOSIS — R531 Weakness: Secondary | ICD-10-CM | POA: Diagnosis not present

## 2023-01-02 DIAGNOSIS — J449 Chronic obstructive pulmonary disease, unspecified: Secondary | ICD-10-CM | POA: Diagnosis not present

## 2023-01-02 DIAGNOSIS — R0600 Dyspnea, unspecified: Secondary | ICD-10-CM | POA: Diagnosis not present

## 2023-01-02 DIAGNOSIS — R0902 Hypoxemia: Secondary | ICD-10-CM | POA: Diagnosis not present

## 2023-01-08 ENCOUNTER — Encounter (INDEPENDENT_AMBULATORY_CARE_PROVIDER_SITE_OTHER): Payer: Medicare Other | Admitting: Ophthalmology

## 2023-01-08 DIAGNOSIS — I10 Essential (primary) hypertension: Secondary | ICD-10-CM | POA: Diagnosis not present

## 2023-01-08 DIAGNOSIS — H35033 Hypertensive retinopathy, bilateral: Secondary | ICD-10-CM | POA: Diagnosis not present

## 2023-01-08 DIAGNOSIS — H43813 Vitreous degeneration, bilateral: Secondary | ICD-10-CM

## 2023-01-08 DIAGNOSIS — H353231 Exudative age-related macular degeneration, bilateral, with active choroidal neovascularization: Secondary | ICD-10-CM | POA: Diagnosis not present

## 2023-01-08 DIAGNOSIS — H33302 Unspecified retinal break, left eye: Secondary | ICD-10-CM | POA: Diagnosis not present

## 2023-01-09 ENCOUNTER — Encounter (HOSPITAL_COMMUNITY): Payer: Self-pay

## 2023-01-09 ENCOUNTER — Emergency Department (HOSPITAL_COMMUNITY): Payer: Medicare Other

## 2023-01-09 ENCOUNTER — Other Ambulatory Visit: Payer: Self-pay

## 2023-01-09 ENCOUNTER — Encounter (INDEPENDENT_AMBULATORY_CARE_PROVIDER_SITE_OTHER): Payer: Medicare Other | Admitting: Ophthalmology

## 2023-01-09 ENCOUNTER — Observation Stay (HOSPITAL_COMMUNITY)
Admission: EM | Admit: 2023-01-09 | Discharge: 2023-01-10 | Disposition: A | Discharge status: Home / Self Care | Payer: Medicare Other | Attending: Emergency Medicine | Admitting: Emergency Medicine

## 2023-01-09 DIAGNOSIS — I5032 Chronic diastolic (congestive) heart failure: Secondary | ICD-10-CM | POA: Diagnosis not present

## 2023-01-09 DIAGNOSIS — R7989 Other specified abnormal findings of blood chemistry: Secondary | ICD-10-CM | POA: Insufficient documentation

## 2023-01-09 DIAGNOSIS — Z79899 Other long term (current) drug therapy: Secondary | ICD-10-CM | POA: Insufficient documentation

## 2023-01-09 DIAGNOSIS — R079 Chest pain, unspecified: Secondary | ICD-10-CM | POA: Diagnosis not present

## 2023-01-09 DIAGNOSIS — Z955 Presence of coronary angioplasty implant and graft: Secondary | ICD-10-CM | POA: Insufficient documentation

## 2023-01-09 DIAGNOSIS — Z853 Personal history of malignant neoplasm of breast: Secondary | ICD-10-CM | POA: Insufficient documentation

## 2023-01-09 DIAGNOSIS — D649 Anemia, unspecified: Secondary | ICD-10-CM | POA: Diagnosis not present

## 2023-01-09 DIAGNOSIS — Z7982 Long term (current) use of aspirin: Secondary | ICD-10-CM | POA: Insufficient documentation

## 2023-01-09 DIAGNOSIS — I2510 Atherosclerotic heart disease of native coronary artery without angina pectoris: Secondary | ICD-10-CM | POA: Insufficient documentation

## 2023-01-09 DIAGNOSIS — I110 Hypertensive heart disease with heart failure: Secondary | ICD-10-CM | POA: Insufficient documentation

## 2023-01-09 DIAGNOSIS — F17210 Nicotine dependence, cigarettes, uncomplicated: Secondary | ICD-10-CM | POA: Insufficient documentation

## 2023-01-09 DIAGNOSIS — Z7901 Long term (current) use of anticoagulants: Secondary | ICD-10-CM | POA: Insufficient documentation

## 2023-01-09 DIAGNOSIS — R0789 Other chest pain: Secondary | ICD-10-CM | POA: Diagnosis present

## 2023-01-09 DIAGNOSIS — J449 Chronic obstructive pulmonary disease, unspecified: Secondary | ICD-10-CM | POA: Insufficient documentation

## 2023-01-09 DIAGNOSIS — J432 Centrilobular emphysema: Secondary | ICD-10-CM | POA: Diagnosis not present

## 2023-01-09 DIAGNOSIS — I499 Cardiac arrhythmia, unspecified: Secondary | ICD-10-CM | POA: Diagnosis not present

## 2023-01-09 DIAGNOSIS — R6889 Other general symptoms and signs: Secondary | ICD-10-CM | POA: Diagnosis not present

## 2023-01-09 DIAGNOSIS — Z743 Need for continuous supervision: Secondary | ICD-10-CM | POA: Diagnosis not present

## 2023-01-09 DIAGNOSIS — I4891 Unspecified atrial fibrillation: Principal | ICD-10-CM | POA: Insufficient documentation

## 2023-01-09 DIAGNOSIS — J9811 Atelectasis: Secondary | ICD-10-CM | POA: Diagnosis not present

## 2023-01-09 LAB — CBC WITH DIFFERENTIAL/PLATELET
Abs Immature Granulocytes: 0.06 10*3/uL (ref 0.00–0.07)
Basophils Absolute: 0.1 10*3/uL (ref 0.0–0.1)
Basophils Relative: 1 %
Eosinophils Absolute: 0.4 10*3/uL (ref 0.0–0.5)
Eosinophils Relative: 4 %
HCT: 33.2 % — ABNORMAL LOW (ref 36.0–46.0)
Hemoglobin: 9.4 g/dL — ABNORMAL LOW (ref 12.0–15.0)
Immature Granulocytes: 1 %
Lymphocytes Relative: 13 %
Lymphs Abs: 1.4 10*3/uL (ref 0.7–4.0)
MCH: 27.1 pg (ref 26.0–34.0)
MCHC: 28.3 g/dL — ABNORMAL LOW (ref 30.0–36.0)
MCV: 95.7 fL (ref 80.0–100.0)
Monocytes Absolute: 0.8 10*3/uL (ref 0.1–1.0)
Monocytes Relative: 8 %
Neutro Abs: 8.1 10*3/uL — ABNORMAL HIGH (ref 1.7–7.7)
Neutrophils Relative %: 73 %
Platelets: UNDETERMINED 10*3/uL (ref 150–400)
RBC: 3.47 MIL/uL — ABNORMAL LOW (ref 3.87–5.11)
RDW: 16.4 % — ABNORMAL HIGH (ref 11.5–15.5)
WBC: 10.9 10*3/uL — ABNORMAL HIGH (ref 4.0–10.5)
nRBC: 0 % (ref 0.0–0.2)

## 2023-01-09 LAB — BASIC METABOLIC PANEL
Anion gap: 9 (ref 5–15)
BUN: 12 mg/dL (ref 8–23)
CO2: 21 mmol/L — ABNORMAL LOW (ref 22–32)
Calcium: 6.9 mg/dL — ABNORMAL LOW (ref 8.9–10.3)
Chloride: 111 mmol/L (ref 98–111)
Creatinine, Ser: 0.8 mg/dL (ref 0.44–1.00)
GFR, Estimated: >60 mL/min (ref >60)
Glucose, Bld: 115 mg/dL — ABNORMAL HIGH (ref 70–99)
Potassium: 4.2 mmol/L (ref 3.5–5.1)
Sodium: 141 mmol/L (ref 135–145)

## 2023-01-09 LAB — APTT: aPTT: 34 seconds (ref 24–36)

## 2023-01-09 LAB — TROPONIN I (HIGH SENSITIVITY)
Troponin I (High Sensitivity): 29 ng/L — ABNORMAL HIGH (ref <18)
Troponin I (High Sensitivity): 388 ng/L (ref <18)
Troponin I (High Sensitivity): 737 ng/L (ref <18)
Troponin I (High Sensitivity): 949 ng/L (ref <18)

## 2023-01-09 LAB — BRAIN NATRIURETIC PEPTIDE: B Natriuretic Peptide: 126 pg/mL — ABNORMAL HIGH (ref 0.0–100.0)

## 2023-01-09 LAB — HEPARIN LEVEL (UNFRACTIONATED): Heparin Unfractionated: >1.1 IU/mL — ABNORMAL HIGH (ref 0.30–0.70)

## 2023-01-09 LAB — MAGNESIUM: Magnesium: 1.5 mg/dL — ABNORMAL LOW (ref 1.7–2.4)

## 2023-01-09 MED ORDER — METOPROLOL TARTRATE 5 MG/5ML IV SOLN
2.5000 mg | Freq: Once | INTRAVENOUS | Status: AC
  Administered 2023-01-09: 2.5 mg via INTRAVENOUS
  Filled 2023-01-09: qty 5

## 2023-01-09 MED ORDER — NICOTINE 14 MG/24HR TD PT24
14.0000 mg | MEDICATED_PATCH | Freq: Every day | TRANSDERMAL | Status: DC
  Administered 2023-01-09 – 2023-01-10 (×2): 14 mg via TRANSDERMAL
  Filled 2023-01-09 (×2): qty 1

## 2023-01-09 MED ORDER — SODIUM CHLORIDE 0.9 % IV BOLUS
250.0000 mL | Freq: Once | INTRAVENOUS | Status: AC
  Administered 2023-01-09: 250 mL via INTRAVENOUS

## 2023-01-09 MED ORDER — MAGNESIUM OXIDE -MG SUPPLEMENT 400 (240 MG) MG PO TABS
800.0000 mg | ORAL_TABLET | Freq: Once | ORAL | Status: AC
  Administered 2023-01-09: 800 mg via ORAL
  Filled 2023-01-09: qty 2

## 2023-01-09 MED ORDER — PROPOFOL 10 MG/ML IV BOLUS
0.5000 mg/kg | Freq: Once | INTRAVENOUS | Status: AC
  Administered 2023-01-09: 52.2 mg via INTRAVENOUS
  Filled 2023-01-09: qty 20

## 2023-01-09 MED ORDER — HEPARIN (PORCINE) 25000 UT/250ML-% IV SOLN
1200.0000 [IU]/h | INTRAVENOUS | Status: DC
  Administered 2023-01-09: 900 [IU]/h via INTRAVENOUS
  Filled 2023-01-09: qty 250

## 2023-01-09 NOTE — Hospital Course (Addendum)
Jazzilyn Bloomingdale is a 77 y.o.female with a history of cardiac stents x 4, OSA, A-fib, HLD, depression/anxiety, HTN, HFpEF and centrilobular emphysema who was admitted to the [facility] Medicine Teaching Service at [facility] for chest pain. Her hospital course is detailed below:  Afib with RVR Elevated troponin Presented with chest pain and found to have afib with RVR. Stable on Eliquis outpatient so received cardioversion in ED. Successfully converted to NSR and placed on heparin drip. Post-conversion EKG stable but troponin trended from 29 to peak of 979. Likely in the setting of demand ischemia. Cardiology fellow consulted and recommend cessation of trop and EKG trending and updated echo. Echo EF 65-70% with moderate LVH and possible worsening diastolic HF. Cardiology Dr. [name] evaluated patient and recommended discharge on Amiodarone '200mg'$  daily and Plavix in addition to Eliquis. Cardiology notified and will schedule f/u with Dr. [name].  Other chronic conditions were medically managed with home medications and formulary alternatives as necessary (anxiety, HTN, HFpEF)  PCP Follow-up Recommendations:  Cardiology f/u with Dr. [name] in 1-2 weeks

## 2023-01-09 NOTE — ED Notes (Signed)
Cardioversion set up, pharmacy notified and ED provider awaiting call back from cardiologist.

## 2023-01-09 NOTE — Assessment & Plan Note (Addendum)
S/p cardioversion in ED, now in NSR. Mildly tachycardic to 100s this AM. Asymptomatic. -Heparin per pharmacy, transition back to Eliquis pending Cards rec -Restart home Metoprolol 12.'5mg'$  BID -Goal K>4, Mg>2

## 2023-01-09 NOTE — Assessment & Plan Note (Addendum)
On home oxygen 2.5L, breathing comfortably and lung exam reassuring. - Restart home Spiriva and albuterol

## 2023-01-09 NOTE — Assessment & Plan Note (Addendum)
Mild peripheral edema, denies orthopnea and negative for crackles on exam. EF 55-60% in 08/2022. -Echo pending

## 2023-01-09 NOTE — Progress Notes (Signed)
BRIEF CARDIOLOGY NOTE   77 year old with pAfib, CAD with PCI to RCA (08/2022), COPD, obesity presented to the ED with hours of chest pressure. Found to be afib with RVR with inferior and lateral ST depressions. Initial troponin was 29. She was then cardioverted and followed troponin was 388 and rose to 737. Per ED, patient is without chest pressure, in NSR without chest pain.   In review of her October 2023 cath, she had 70% disease in her RCA that was ultimately stented, followed by diffuse non obstructive disease. In review of her prior admission, she presented with similar presentation of chest pressure with an initial troponin of 19. After DCCV her troponin rose to 144. She was admitted and cathed and the lesion was found. In an abundance of caution, would recommend observation admission to medicine. Patient to be seen and seen by her cardiology team in the AM.    Libby Maw, MD MS  Cardiology

## 2023-01-09 NOTE — ED Notes (Signed)
Notified from lab that pt green top hemolyzed. Repeat drawn at 1253 @sent$  down to lab for recollect. Awaiting results.

## 2023-01-09 NOTE — ED Provider Notes (Signed)
.  Sedation  Date/Time: 01/09/2023 2:08 PM  Performed by: Hayden Rasmussen, MD Authorized by: Hayden Rasmussen, MD   Consent:    Consent obtained:  Written   Consent given by:  Patient   Risks discussed:  Allergic reaction, dysrhythmia, inadequate sedation, nausea, vomiting, respiratory compromise necessitating ventilatory assistance and intubation, prolonged sedation necessitating reversal and prolonged hypoxia resulting in organ damage   Alternatives discussed:  Analgesia without sedation and anxiolysis Universal protocol:    Procedure explained and questions answered to patient or proxy's satisfaction: yes     Immediately prior to procedure, a time out was called: yes     Patient identity confirmed:  Arm band Indications:    Procedure performed:  Cardioversion   Procedure necessitating sedation performed by:  Physician performing sedation Pre-sedation assessment:    Time since last food or drink:  4   ASA classification: class 3 - patient with severe systemic disease     Mouth opening:  2 finger widths   Thyromental distance:  3 finger widths   Mallampati score:  III - soft palate, base of uvula visible   Neck mobility: normal     Pre-sedation assessments completed and reviewed: airway patency, cardiovascular function, hydration status, mental status, nausea/vomiting, pain level, respiratory function and temperature     Pre-sedation assessment completed:  01/09/2023 1:39 PM Immediate pre-procedure details:    Reassessment: Patient reassessed immediately prior to procedure     Reviewed: vital signs, relevant labs/tests and NPO status     Verified: bag valve mask available, emergency equipment available, intubation equipment available, IV patency confirmed, oxygen available, reversal medications available and suction available   Procedure details (see MAR for exact dosages):    Preoxygenation:  Nasal cannula   Sedation:  Propofol   Intended level of sedation: deep   Intra-procedure  monitoring:  Blood pressure monitoring, cardiac monitor, continuous pulse oximetry, continuous capnometry, frequent LOC assessments and frequent vital sign checks   Intra-procedure events: none     Intra-procedure management:  Airway repositioning   Total Provider sedation time (minutes):  10 Post-procedure details:    Post-sedation assessment completed:  01/09/2023 2:30 PM   Attendance: Constant attendance by certified staff until patient recovered     Recovery: Patient returned to pre-procedure baseline     Post-sedation assessments completed and reviewed: airway patency, cardiovascular function, hydration status, mental status, nausea/vomiting, pain level, respiratory function and temperature     Patient is stable for discharge or admission: yes     Procedure completion:  Tolerated well, no immediate complications     Hayden Rasmussen, MD 01/09/23 1642

## 2023-01-09 NOTE — Assessment & Plan Note (Addendum)
No further trending of troponins. Per cardiology fellow: continue heparin overnight, obtain morning echocardiogram, no need to trend troponins further.  We will observe patient and repeat troponins/EKG if patient develops symptoms. Dr. [name] to see patient this AM. -Echo pending -Continue telemetry

## 2023-01-09 NOTE — ED Notes (Signed)
Paged MD [name] for [place], left voicemail.

## 2023-01-09 NOTE — Discharge Instructions (Addendum)
Dear Martha Mora,   Thank you so much for allowing [place] to be part of your care!  You were admitted to [facility] for Afib RVR. You were cardioverted to a normal rhythm in the ED. Please take Amiodarone '200mg'$  daily and Plavix upon discharge until you see Dr. Loetta Rough   POST-HOSPITAL & CARE INSTRUCTIONS Please let PCP/Specialists know of any changes that were made.  Please see medications section of this packet for any medication changes.  Please follow up with your cardiologist outpatient  [place]  Future Appointments  Date Time Provider [place]  01/10/2023 11:00 AM MC ECHO 3 MC-ECHOLAB [facility]  01/14/2023 12:30 PM Alla Feeling, NP [facility]-MEDONC None  02/03/2023  5:00 PM GI-BCG MM 3 GI-BCGMM GI-BREAST CE  02/05/2023  2:00 PM Hayden Pedro, MD TRE-TRE None    RETURN PRECAUTIONS: Chest pain, palpitations, racing heart  Take care and be well!  [facility]  [place], [place] [address] [phone]

## 2023-01-09 NOTE — H&P (Addendum)
Hospital Admission History and Physical Service Pager: [phone]  Patient name: Martha Mora Medical record number: [id number] Date of Birth: March 05, 1946 Age: 77 y.o. Gender: female  Primary Care Provider: Gerrit Heck, MD Consultants: Cardiology Code Status: Full code which was confirmed with patient Preferred Emergency Contact: Garlan Fillers Information     Name Relation Home Work Mobile   Poplin,Kerry Daughter [phone]  [phone]   Aggie Hacker Son [phone]  [phone]       Chief Complaint: Shoulder pain  Assessment and Plan: Danni Mctear is a 77 y.o. female presenting with shoulder/chest pain found to have new onset atrial fibrillation with a rate of 150.  Past medical history includes: Cardiac stents x 4, OSA, A-fib, hyperlipidemia, depression/anxiety, hypertension, heart failure with preserved ejection fraction, centrilobular emphysema.  Patient successfully cardioverted in ED, on anticoagulation with Eliquis at home.  Started on heparin during this admission per cardiology.  Patient currently asymptomatic.  Patient is seen by [facility] cardiology, unfortunately there is no coverage for [facility] team today-Per ED provider report. Elevated troponins after cardioversion.  On-call cardiology fellow Dr. Margaretha Sheffield was consulted by ED provider.  Cardiology fellow recommended admission for observation until [facility] cardiology can see patient in the morning. They recommend observation out of precaution due to elevated troponins-as patient was asymptomatic with elevated troponins in October when she received her cath.  Will admit patient to observation, plan below.  * Elevated troponin Initial troponin on presentation of 29.  Significant heart history with 4 stents, most recent coronary artery stent in October 2023-of note patient was asymptomatic with elevated troponins when she received stent in October.  Troponin elevated to 388 after  cardioversion, trended 737>>949.  Cardiology fellow was consulted by ED provider.  Per cardiology fellow: continue heparin overnight, obtain morning echocardiogram, no need to trend troponins further.  We will observe patient and repeat troponins/EKG if patient develops symptoms. - Pending echocardiogram - Cardiac telemetry  Atrial fibrillation ([place]) Status post cardioversion w/ 150 J in ED at approximately 1420. Patient on Eliquis for anticoagulation at home, but started on heparin during this admission.  Chest pain is currently resolved and normal sinus rhythm on admission.  Hypomagnesemic at 1.5, s/p MagOx '800mg'$ . Goal K>4, Mg>2. - Continue heparin - PTT, heparin levels - AM magnesium, BMP  Chronic heart failure with preserved ejection fraction [facility]) Echocardiogram October 2023 showed LVEF of 55-60%, with grade 1 diastolic dysfunction.  Patient is euvolemic on exam.  BNP of 126.  Do not suspect exacerbation at this time.  Obtaining echocardiogram as above.  Anemia Known iron deficiency anemia.  Taking iron at home. - AM CBC  Centrilobular emphysema (HCC) Maintain SpO2 goal of 88-92%.  Restart home inhalers.  Patient is not wheezing on exam, normal work of breathing on 2.5 L [place].  Baseline oxygen requirement of 2.5 L at home. - Restart home Spiriva and albuterol  Other chronic medical conditions HTN: Pending med rec Anxiety/depression: Pending med rec Nicotine use: 14 mg nicotine patch  FEN/GI: Heart Healthy VTE Prophylaxis: On full dose anticoag w/ Heparin  Disposition: med-tele  History of Present Illness:  Martha Mora is a 77 y.o. female presenting with bilateral shoulder pain.  Patient reports that she has had mild bilateral shoulder pain and chest pain ongoing for over a month.  Noted today that her right shoulder pain was worse and felt her heart rate was off, so she sought medical care.  Reports that she has history of A-fib,  cardioverted previously.  Endorses  compliance with Eliquis.  Denies cough, fever, NVD, headaches, vision changes.  Patient was cardioverted in the ED.  At time of admission, she reports no chest pain and no difficulty breathing.  She is on 2.5 L home oxygen at baseline for COPD.  In the ED, patient was found to be in A-fib with a rate of 150.  Confirmed patient was on anticoagulation with Eliquis.  Cardiology was consulted who recommended cardioversion.  Patient underwent cardioversion at 150 J synchronized with good results.  Is without chest pain.  However had elevated troponins after cardioversion.  On-call cardiology fellow recommended admission for observation and to continue heparin until [facility] cardiology can see her in the morning. FMTS paged for admission.  Review Of Systems: Per HPI  Pertinent Past Medical History: COPD Macular degeneration CHF AFib Remainder reviewed in history tab.   Pertinent Past Surgical History: Appendectomy Bone graft Breast lumpectomy Cataract extraction C/S Cholecystectomy Stent placement x4 (per patient) Femur fracture surgery Lumbar laminectomy Tonsillectomy and adenoidectomy Tubal ligation  Remainder reviewed in history tab.  Pertinent Social History: Tobacco use: Former, vapes currently. Smoked 1.5 packs since age 31, stopped in 2018 Alcohol use: none Other Substance use: none Lives with herself but next to her son  Pertinent Family History: Mother: COPD, depression, HTN, stroke Father: alcohol abuse, COPD Daughter: breast cancer Remainder reviewed in history tab.   Important Outpatient Medications: Pending official med rec.  Remainder reviewed in medication history.   Objective: BP (!) 132/96   Pulse 80   Temp 98.2 F (36.8 C) (Oral)   Resp (!) 21   Ht '5\' 1"'$  (1.549 m)   Wt 104.3 kg   SpO2 97%   BMI 43.46 kg/m  Exam: General: NAD, resting comfortably in bed, chronically ill-appearing Eyes: EOM intact bilaterally, normal conjunctiva ENTM: Atraumatic,  normocephalic head.  Normal external ears and nose.  Moist mucous membranes. Cardiovascular: RRR, grade 3/6 systolic murmur Respiratory: CTAB, normal breathing on 2.5L [place] Gastrointestinal: Soft, nontender, nondistended.  Bowel sounds present Extremities: Moves all 4 extremities, no edema Derm: Warm and dry Neuro: Alert and oriented x 4  Labs:  CBC BMET  Recent Labs  Lab 01/09/23 1142  WBC 10.9*  HGB 9.4*  HCT 33.2*  PLT PLATELET CLUMPS NOTED ON SMEAR, UNABLE TO ESTIMATE   Recent Labs  Lab 01/09/23 1300  NA 141  K 4.2  CL 111  CO2 21*  BUN 12  CREATININE 0.80  GLUCOSE 115*  CALCIUM 6.9*    Trop 29>388>737>949 BNP 126 Mg 1.5  EKG: Normal sinus rhythm with no ischemia, after cardioversion  Imaging Studies Performed:  DG Chest Portable 1 View Result Date: 01/09/2023 IMPRESSION: Findings suggest CHF. Bibasilar subsegmental atelectasis.   Leslie Dales, DO 01/10/2023, 12:01 AM PGY-1, [place] Intern pager: [phone], text pages welcome Secure chat group [place] Upper-Level Resident Addendum   I have independently interviewed and examined the patient. I have discussed the above with Dr. Nelda Bucks and agree with the documented plan. My edits for correction/addition/clarification are included above. Please see any attending notes.   Wells Guiles, DO PGY-2, [place] [facility] 01/10/2023 12:55 AM  FPTS Service pager: [phone] (text pages welcome through [facility])

## 2023-01-09 NOTE — ED Provider Notes (Signed)
[facility] Provider Note   CSN: [id number] Arrival date & time: 01/09/23  1124     History  Chief Complaint  Patient presents with   Atrial Fibrillation    Martha Mora is a 77 y.o. female with a past medical history significant for hyperlipidemia, history of A-fib on Eliquis, COPD, CHF, CAD who presents to the ED due to chest pain.  Patient states chest pain has been present for the past few days which waxed and waned in severity however, notes persistent central, nonradiating chest pain for the past few hours.  Patient states chest pain feels similar to previous MI.  Upon EMS arrival patient found to be in A-fib with RVR.  Patient notes she has not taken her Plavix in roughly 1 month.  Prior to arrival patient given 10 mg of Cardizem and nitroglycerin.  She also took ASA and 1 nitroglycerin prior to EMS arrival.  Admits to some diaphoresis.  No shortness of breath, nausea, or vomiting.  History obtained from patient and past medical records. No interpreter used during encounter.       Home Medications Prior to Admission medications   Medication Sig Start Date End Date Taking? Authorizing Provider  acetaminophen (TYLENOL) 325 MG tablet Take 2 tablets (650 mg total) by mouth every 6 (six) hours as needed for mild pain, fever or headache. Patient taking differently: Take 975 mg by mouth every 6 (six) hours as needed for mild pain, fever or headache. 04/08/22   Dameron, Luna Fuse, DO  albuterol (VENTOLIN HFA) 108 (90 Base) MCG/ACT inhaler TAKE 1-2 INHALATIONS EVERY 4-6 HOURS AS NEEDED FOR WHEEZING. 12/31/22   Gerrit Heck, MD  amiodarone (PACERONE) 200 MG tablet Take 1/2 tablet (100 mg total) by mouth daily. **Schedule an appointment with your cardiologist prior to next refill. Patient not taking: Reported on 10/25/2022 03/04/22   [name], DO  amLODipine (NORVASC) 5 MG tablet Take 5 mg by mouth daily. 03/01/22   [provider]  aspirin 81 MG chewable tablet Chew 1 tablet (81 mg total) by mouth daily. 08/24/22   [name], MD  busPIRone (BUSPAR) 15 MG tablet Take 1 tablet (15 mg total) by mouth 3 (three) times daily. 12/23/22   Gerrit Heck, MD  Calcium Carb-Cholecalciferol 600-800 MG-UNIT TABS Take 1 tablet by mouth 2 (two) times a day.    [provider]  clopidogrel (PLAVIX) 75 MG tablet Take 1 tablet (75 mg total) by mouth daily with breakfast. 08/24/22   [name], MD  DULoxetine (CYMBALTA) 60 MG capsule TAKE 1 CAPSULE BY MOUTH EVERY DAY Patient taking differently: Take 60 mg by mouth daily. 06/03/22   Gerrit Heck, MD  ELIQUIS 5 MG TABS tablet TAKE 1 TABLET BY MOUTH TWICE  DAILY 10/14/22   Gerrit Heck, MD  Ferrous Sulfate (IRON) 325 (65 Fe) MG TABS Take 1 tablet (325 mg total) by mouth every other day. 07/26/22   Gerrit Heck, MD  levalbuterol Penne Lash) 0.63 MG/3ML nebulizer solution Take 3 mLs (0.63 mg total) by nebulization every 6 (six) hours as needed for wheezing or shortness of breath. 09/02/22   Cobb, Karie Schwalbe, NP  levalbuterol Penne Lash) 1.25 MG/3ML nebulizer solution Inhale into the lungs. 12/26/22   [provider]  loratadine (CLARITIN) 10 MG tablet Take 1 tablet (10 mg total) by mouth daily. 03/11/22   [name], DO  metoprolol tartrate (LOPRESSOR) 25 MG tablet TAKE 1/2 TABLET BY MOUTH TWICE A DAY 12/18/22  Gerrit Heck, MD  Multiple Vitamin (MULTIVITAMIN) tablet Take 1 tablet by mouth daily. Herbal Life multivitamin    [provider]  Multiple Vitamins-Minerals (PRESERVISION AREDS 2 PO) Take 1 tablet by mouth daily.    [provider]  nitroGLYCERIN (NITROSTAT) 0.4 MG SL tablet Place 1 tablet (0.4 mg total) under the tongue every 5 (five) minutes x 3 doses as needed for chest pain. 08/23/22   [name], MD  pantoprazole (PROTONIX) 40 MG tablet Take 1 tablet (40 mg total) by mouth daily. 08/24/22   [name], MD  pregabalin (LYRICA)  150 MG capsule TAKE 1 CAPSULE BY MOUTH TWICE A DAY 12/31/22   Gerrit Heck, MD  PROLIA 60 MG/ML SOSY injection Inject 60 mg into the skin every 6 (six) months.  08/18/18   [provider]  rosuvastatin (CRESTOR) 20 MG tablet Take 1 tablet (20 mg total) by mouth daily. 12/04/22   Gerrit Heck, MD  Tiotropium Bromide Monohydrate (SPIRIVA RESPIMAT) 2.5 MCG/ACT AERS Inhale 2 puffs into the lungs daily. 09/02/22   Cobb, Karie Schwalbe, NP  traZODone (DESYREL) 50 MG tablet TAKE 1 TABLET BY MOUTH EVERYDAY AT BEDTIME Patient not taking: Reported on 10/25/2022 06/25/22   Gerrit Heck, MD  Vitamin D, Ergocalciferol, (DRISDOL) 1.25 MG (50000 UNIT) CAPS capsule TAKE 1 CAPSULE BY MOUTH ONE TIME PER WEEK 10/14/22   Truitt Merle, MD      Allergies    Keflex [cephalexin], Penicillins, Requip xl [ropinirole hcl], and Shellfish allergy    Review of Systems   Review of Systems  Constitutional:  Positive for diaphoresis. Negative for chills and fever.  Respiratory:  Negative for shortness of breath.   Cardiovascular:  Positive for chest pain.  All other systems reviewed and are negative.   Physical Exam Updated Vital Signs BP 128/82   Pulse 75   Temp 98.1 F (36.7 C) (Oral)   Resp (!) 21   Ht 5' 1"$  (1.549 m)   Wt 104.3 kg   SpO2 100%   BMI 43.46 kg/m  Physical Exam Vitals and nursing note reviewed.  Constitutional:      General: She is not in acute distress.    Appearance: She is not ill-appearing.  HENT:     Head: Normocephalic.  Eyes:     Pupils: Pupils are equal, round, and reactive to light.  Cardiovascular:     Rate and Rhythm: Normal rate and regular rhythm.     Pulses: Normal pulses.     Heart sounds: Normal heart sounds. No murmur heard.    No friction rub. No gallop.     Comments: A. Fib RVR HR 150s on cardiac monitor Pulmonary:     Effort: Pulmonary effort is normal.     Breath sounds: Normal breath sounds.  Abdominal:     General: Abdomen is flat. There is no  distension.     Palpations: Abdomen is soft.     Tenderness: There is no abdominal tenderness. There is no guarding or rebound.  Musculoskeletal:        General: Normal range of motion.     Cervical back: Neck supple.     Comments: 1+ pitting edema bilaterally  Skin:    General: Skin is warm and dry.  Neurological:     General: No focal deficit present.     Mental Status: She is alert.  Psychiatric:        Mood and Affect: Mood normal.        Behavior: Behavior normal.  ED Results / Procedures / Treatments   Labs (all labs ordered are listed, but only abnormal results are displayed) Labs Reviewed  CBC WITH DIFFERENTIAL/PLATELET - Abnormal; Notable for the following components:      Result Value   WBC 10.9 (*)    RBC 3.47 (*)    Hemoglobin 9.4 (*)    HCT 33.2 (*)    MCHC 28.3 (*)    RDW 16.4 (*)    Neutro Abs 8.1 (*)    All other components within normal limits  BRAIN NATRIURETIC PEPTIDE - Abnormal; Notable for the following components:   B Natriuretic Peptide 126.0 (*)    All other components within normal limits  BASIC METABOLIC PANEL - Abnormal; Notable for the following components:   CO2 21 (*)    Glucose, Bld 115 (*)    Calcium 6.9 (*)    All other components within normal limits  MAGNESIUM - Abnormal; Notable for the following components:   Magnesium 1.5 (*)    All other components within normal limits  TROPONIN I (HIGH SENSITIVITY) - Abnormal; Notable for the following components:   Troponin I (High Sensitivity) 29 (*)    All other components within normal limits  TROPONIN I (HIGH SENSITIVITY)    EKG EKG Interpretation  Date/Time:  Thursday January 09 2023 11:31:26 EST Ventricular Rate:  139 PR Interval:    QRS Duration: 88 QT Interval:  318 QTC Calculation: 484 R Axis:   -13 Text Interpretation: Atrial fibrillation ST depression, probably rate related new from prior 10/23 Confirmed by Aletta Edouard [phone]) on 01/09/2023 12:06:41  PM  Radiology DG Chest Portable 1 View  Result Date: 01/09/2023 CLINICAL DATA:  CP EXAM: PORTABLE CHEST - 1 VIEW COMPARISON:  08/20/2022 FINDINGS: Cardiac silhouette is prominent. There is pulmonary interstitial prominence with vascular congestion. There is linear dependent bibasilar subsegmental atelectasis. Otherwise no focal consolidation. No pneumothorax or pleural effusion identified. IMPRESSION: Findings suggest CHF. Bibasilar subsegmental atelectasis. Electronically Signed   By: Sammie Bench M.D.   On: 01/09/2023 12:26    Procedures .Cardioversion  Date/Time: 01/09/2023 2:22 PM  Performed by: Suzy Bouchard, PA-C Authorized by: Suzy Bouchard, PA-C   Consent:    Consent obtained:  Verbal and written   Consent given by:  Patient   Risks discussed:  Death, cutaneous burn, induced arrhythmia and pain   Alternatives discussed:  No treatment and rate-control medication Pre-procedure details:    Cardioversion basis:  Elective   Rhythm:  Atrial fibrillation   Electrode placement:  Anterior-lateral Patient sedated: Yes. Refer to sedation procedure documentation for details of sedation.  Attempt one:    Cardioversion mode:  Synchronous   Waveform:  Monophasic   Shock (Joules):  150   Shock outcome:  Conversion to normal sinus rhythm Post-procedure details:    Patient status:  Awake   Patient tolerance of procedure:  Tolerated well, no immediate complications .Critical Care  Performed by: Suzy Bouchard, PA-C Authorized by: Suzy Bouchard, PA-C   Critical care provider statement:    Critical care time (minutes):  31   Critical care was necessary to treat or prevent imminent or life-threatening deterioration of the following conditions:  Cardiac failure   Critical care was time spent personally by me on the following activities:  Development of treatment plan with patient or surrogate, discussions with consultants, evaluation of patient's response to  treatment, examination of patient, ordering and review of laboratory studies, ordering and review of radiographic studies, ordering and  performing treatments and interventions, pulse oximetry, re-evaluation of patient's condition and review of old charts   I assumed direction of critical care for this patient from another provider in my specialty: no       Medications Ordered in ED Medications  metoprolol tartrate (LOPRESSOR) injection 2.5 mg (2.5 mg Intravenous Given 01/09/23 1150)  sodium chloride 0.9 % bolus 250 mL (0 mLs Intravenous Stopped 01/09/23 1438)  propofol (DIPRIVAN) 10 mg/mL bolus/IV push 52.2 mg (52.2 mg Intravenous Given by Other 01/09/23 1401)    ED Course/ Medical Decision Making/ A&P Clinical Course as of 01/09/23 1540  Thu Jan 09, 2023  1208 She is here with a complaint of pain in her shoulders and chest that is been going on for over a month but was worse today.  She is in a rapid A-fib with a rate 150.  Blood pressure okay.  She is getting IV rate control and lab work, will need cardiology input.  Unfortunately she is recently discontinued her anticoagulation so she is not necessarily a candidate for elective cardioversion. [MB]  [id number] Reassessed patient, HR still in 150s.  [id number] Apparently she has been taking her Eliquis but has not been taking her Plavix.  Getting cardiology recommendations if cardioversion indicated at this time. [MB]  [id number] Patient underwent cardioversion at 150 J synchronized with good results.  She is resting but easily arousable and feels better. [MB]  [id number] Reassessed patient at bedside. Patient still in NSR. Notes CP has completely resolved once cardioverted.  [CA]    Clinical Course User Index [CA] Suzy Bouchard, PA-C [MB] Hayden Rasmussen, MD                             Medical Decision Making Amount and/or Complexity of Data Reviewed External Data Reviewed: notes. Labs: ordered. Decision-making details documented in ED  Course. Radiology: ordered and independent interpretation performed. Decision-making details documented in ED Course. ECG/medicine tests: ordered and independent interpretation performed. Decision-making details documented in ED Course.  Risk Prescription drug management.   This patient presents to the ED for concern of CP, this involves an extensive number of treatment options, and is a complaint that carries with it a high risk of complications and morbidity.  The differential diagnosis includes A. Fib, ACS, PE, dissection, etc  77 year old female presents to the ED due to chest pain.  Patient found to be in A-fib with RVR heart rates in the 150s.  Patient has a history of A-fib on chronic Eliquis which she has been compliant with.  Upon arrival, patient A-fib with heart rate in the 150s.  1+ pitting edema bilaterally.  History of CHF.  Routine labs ordered.  Chest x-ray and EKG. Discussed with Dr. Melina Copa who evaluated patient at bedside and agrees with assessment and plan.  Discussed with Dr. Johnsie Cancel with [facility] who recommends cardioversion. Patient is a patient of Dr. [name] and unfortunately unable to reach him.   CBC significant for leukocytosis at 10.9.  Hemoglobin at 9.4 which appears to be around patient's baseline.  BNP slightly elevated at 126.  Chest x-ray reviewed and interpreted which is suggestive of CHF.  No evidence of pneumonia.  Patient denies any shortness of breath.  EKG demonstrates A-fib with RVR with some generalized ST depressions likely rate dependent.  Troponin elevated 29 likely due to demand ischemia. Magnesium slightly low, magnesium given.  Patient cardioverted as noted above without complication.  Offered admission for further observation to patient however, she declined and prefers to be discharged and notes she will return if she goes back into A. Fib.  Patient handed off to [facility], PA-C at shift change pending 2nd troponin and observation after  cardioversion.         Final Clinical Impression(s) / ED Diagnoses Final diagnoses:  Atrial fibrillation with RVR [facility])    Rx / DC Orders ED Discharge Orders     None         Karie Kirks 01/09/23 1546    Hayden Rasmussen, MD 01/09/23 [id number]
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
## 2023-01-09 NOTE — ED Triage Notes (Signed)
Pt to ED via EMS with Afib RVR. EMS stated that pt called out for CP and upon arrival she was in Afib RVR HR 190s. Pt took 329m of ASA and 1 ntg with no improvement in symptoms. EMS gave 122mof Cardizem, and 1 NTG tablet in route. Pt BP 98/60 with EMS, and HR sustaining 140s. Pt Aox4 in triage, denies CP or SOB at this time but reports shoulder blade pain. Hx of MI pt reports she has bee out of her Plavix for 1 month.

## 2023-01-09 NOTE — Assessment & Plan Note (Addendum)
Hgb 9.4, stable from prior and appears close to baseline Hgb ~10.

## 2023-01-09 NOTE — ED Provider Notes (Signed)
Physical Exam  BP 128/82   Pulse 75   Temp 98.3 F (36.8 C) (Oral)   Resp (!) 21   Ht 5' 1"$  (1.549 m)   Wt 104.3 kg   SpO2 100%   BMI 43.46 kg/m   Physical Exam Vitals and nursing note reviewed.  Constitutional:      General: She is not in acute distress.    Appearance: She is not toxic-appearing.  Eyes:     General: No scleral icterus. Cardiovascular:     Rate and Rhythm: Normal rate and regular rhythm.  Pulmonary:     Effort: Pulmonary effort is normal. No respiratory distress.  Skin:    General: Skin is warm and dry.  Neurological:     Mental Status: She is alert.     Procedures  Procedures  ED Course / MDM   Clinical Course as of 01/09/23 2153  Thu Jan 09, 2023  1208 She is here with a complaint of pain in her shoulders and chest that is been going on for over a month but was worse today.  She is in a rapid A-fib with a rate 150.  Blood pressure okay.  She is getting IV rate control and lab work, will need cardiology input.  Unfortunately she is recently discontinued her anticoagulation so she is not necessarily a candidate for elective cardioversion. [MB]  [id number] Reassessed patient, HR still in 150s.  [id number] Apparently she has been taking her Eliquis but has not been taking her Plavix.  Getting cardiology recommendations if cardioversion indicated at this time. [MB]  [id number] Patient underwent cardioversion at 150 J synchronized with good results.  She is resting but easily arousable and feels better. [MB]  [id number] Reassessed patient at bedside. Patient still in NSR. Notes CP has completely resolved once cardioverted.  [CA]  1856 Reassessed the patient at bedside. She is a little somnolent, but awakens to voice. NSR on the monitor. She has no complaints, but would like some food and coffee. [RR]  2125 On the phone with cardiology  [RR]    Clinical Course User Index [CA] Suzy Bouchard, PA-C [MB] Hayden Rasmussen, MD [RR] Sherrell Puller, PA-C   Medical  Decision Making Amount and/or Complexity of Data Reviewed Labs: ordered. Radiology: ordered. ECG/medicine tests: ordered.  Risk OTC drugs. Prescription drug management.   Accepted handoff at shift change from [facility] [place]. Please see prior provider note for more detail.   Briefly: Patient is 77 y.o. F presents to the ER for evaluation of chest pain.   DDX: concern for afib s/p cardioversion.   Plan: Follow up and reassess around 1600  The patient had a delta troponin drawn after cardioversion.  I suspect this to be elevated.  Troponin came back at 388.  Two subsequent troponins resulted at 737 and 949. Repeat EKG was done and shows NSR.  I did place a consult out to cardiology.  I spoke with Dr. Lillia Dallas on the phone. She reviewed the patient's medical charts and called me back. Given that this is a [place] patient, she would like [place] to come and see them tomorrow. Out of an abundance of caution, she would like Heparin ordered for the patient as well given elevated troponins and previous presentations. She already received ASA with EMS today. The patient is amenable to admission. Admit to [facility] Medicine Teaching Service.        Sherrell Puller, PA-C 01/09/23 2243    Wyvonnia Dusky, MD 01/09/23 2258

## 2023-01-09 NOTE — ED Notes (Signed)
RT, MD, pharmacist and additional staff at bedside for cardioversion. Consent signed.  1401-Timeout completed 1400- BP 135/81 HR 162, 99% on 4L [place] 1402-72m- of propofol given  1403-150J synchronized cardioversion completed  1404- Pt Rhythm converted to NSR HR of 78  1404- BP 143/71 99% on 4L [place] 1405- Pt maintaining  airway O2 Sat 99% 4L [place]. Cardioversion complete  1407- Pt alert and reports improvement in symptoms. Pt stable.

## 2023-01-10 ENCOUNTER — Other Ambulatory Visit (HOSPITAL_COMMUNITY): Payer: Self-pay

## 2023-01-10 ENCOUNTER — Observation Stay (HOSPITAL_BASED_OUTPATIENT_CLINIC_OR_DEPARTMENT_OTHER): Payer: Medicare Other

## 2023-01-10 DIAGNOSIS — I4891 Unspecified atrial fibrillation: Principal | ICD-10-CM

## 2023-01-10 LAB — ECHOCARDIOGRAM COMPLETE
Area-P 1/2: 2.99 cm2
Height: 61 in
S' Lateral: 2.9 cm
Weight: 3680 oz

## 2023-01-10 LAB — CBC
HCT: 33.7 % — ABNORMAL LOW (ref 36.0–46.0)
Hemoglobin: 9.4 g/dL — ABNORMAL LOW (ref 12.0–15.0)
MCH: 26.9 pg (ref 26.0–34.0)
MCHC: 27.9 g/dL — ABNORMAL LOW (ref 30.0–36.0)
MCV: 96.6 fL (ref 80.0–100.0)
Platelets: 289 10*3/uL (ref 150–400)
RBC: 3.49 MIL/uL — ABNORMAL LOW (ref 3.87–5.11)
RDW: 16.6 % — ABNORMAL HIGH (ref 11.5–15.5)
WBC: 13.6 10*3/uL — ABNORMAL HIGH (ref 4.0–10.5)
nRBC: 0 % (ref 0.0–0.2)

## 2023-01-10 LAB — BASIC METABOLIC PANEL
Anion gap: 8 (ref 5–15)
BUN: 16 mg/dL (ref 8–23)
CO2: 28 mmol/L (ref 22–32)
Calcium: 8.8 mg/dL — ABNORMAL LOW (ref 8.9–10.3)
Chloride: 103 mmol/L (ref 98–111)
Creatinine, Ser: 0.8 mg/dL (ref 0.44–1.00)
GFR, Estimated: >60 mL/min (ref >60)
Glucose, Bld: 124 mg/dL — ABNORMAL HIGH (ref 70–99)
Potassium: 4 mmol/L (ref 3.5–5.1)
Sodium: 139 mmol/L (ref 135–145)

## 2023-01-10 LAB — APTT
aPTT: 41 seconds — ABNORMAL HIGH (ref 24–36)
aPTT: 40 seconds — ABNORMAL HIGH (ref 24–36)

## 2023-01-10 LAB — MAGNESIUM: Magnesium: 1.8 mg/dL (ref 1.7–2.4)

## 2023-01-10 LAB — PROTIME-INR
INR: 1.1 (ref 0.8–1.2)
Prothrombin Time: 14.5 seconds (ref 11.4–15.2)

## 2023-01-10 LAB — HEPARIN LEVEL (UNFRACTIONATED): Heparin Unfractionated: >1.1 IU/mL — ABNORMAL HIGH (ref 0.30–0.70)

## 2023-01-10 MED ORDER — LIDOCAINE 5 % EX PTCH
2.0000 | MEDICATED_PATCH | CUTANEOUS | Status: DC
  Administered 2023-01-10: 2 via TRANSDERMAL
  Filled 2023-01-10: qty 2

## 2023-01-10 MED ORDER — MELATONIN 3 MG PO TABS
3.0000 mg | ORAL_TABLET | Freq: Every day | ORAL | Status: DC
  Administered 2023-01-10: 3 mg via ORAL
  Filled 2023-01-10: qty 1

## 2023-01-10 MED ORDER — MAGNESIUM SULFATE 2 GM/50ML IV SOLN
2.0000 g | Freq: Once | INTRAVENOUS | Status: AC
  Administered 2023-01-10: 2 g via INTRAVENOUS
  Filled 2023-01-10: qty 50

## 2023-01-10 MED ORDER — AMIODARONE HCL 200 MG PO TABS
200.0000 mg | ORAL_TABLET | Freq: Every day | ORAL | 1 refills | Status: DC
  Filled 2023-01-10: qty 30, 30d supply, fill #0

## 2023-01-10 MED ORDER — CLOPIDOGREL BISULFATE 75 MG PO TABS
75.0000 mg | ORAL_TABLET | Freq: Every day | ORAL | 1 refills | Status: DC
  Filled 2023-01-10: qty 30, 30d supply, fill #0

## 2023-01-10 MED ORDER — WHITE PETROLATUM EX OINT
TOPICAL_OINTMENT | CUTANEOUS | Status: DC | PRN
  Filled 2023-01-10: qty 28.35

## 2023-01-10 MED ORDER — ACETAMINOPHEN 500 MG PO TABS
1000.0000 mg | ORAL_TABLET | Freq: Four times a day (QID) | ORAL | Status: DC | PRN
  Administered 2023-01-10 (×2): 1000 mg via ORAL
  Filled 2023-01-10 (×2): qty 2

## 2023-01-10 MED ORDER — PREGABALIN 25 MG PO CAPS
150.0000 mg | ORAL_CAPSULE | Freq: Two times a day (BID) | ORAL | Status: DC
  Administered 2023-01-10: 150 mg via ORAL
  Filled 2023-01-10: qty 2

## 2023-01-10 MED ORDER — PANTOPRAZOLE SODIUM 40 MG PO TBEC
40.0000 mg | DELAYED_RELEASE_TABLET | Freq: Every day | ORAL | Status: DC
  Administered 2023-01-10: 40 mg via ORAL
  Filled 2023-01-10: qty 1

## 2023-01-10 MED ORDER — UMECLIDINIUM BROMIDE 62.5 MCG/ACT IN AEPB
1.0000 | INHALATION_SPRAY | Freq: Every day | RESPIRATORY_TRACT | Status: DC
  Administered 2023-01-10: 1 via RESPIRATORY_TRACT
  Filled 2023-01-10: qty 7

## 2023-01-10 MED ORDER — BUSPIRONE HCL 10 MG PO TABS
15.0000 mg | ORAL_TABLET | Freq: Three times a day (TID) | ORAL | Status: DC
  Administered 2023-01-10: 15 mg via ORAL
  Filled 2023-01-10: qty 2

## 2023-01-10 MED ORDER — ALBUTEROL SULFATE HFA 108 (90 BASE) MCG/ACT IN AERS: 2.0000 | INHALATION_SPRAY | RESPIRATORY_TRACT | Status: DC | PRN

## 2023-01-10 MED ORDER — SODIUM CHLORIDE 0.9 % IV SOLN: INTRAVENOUS | Status: DC

## 2023-01-10 MED ORDER — DULOXETINE HCL 60 MG PO CPEP
60.0000 mg | ORAL_CAPSULE | Freq: Every day | ORAL | Status: DC
  Administered 2023-01-10: 60 mg via ORAL
  Filled 2023-01-10: qty 1

## 2023-01-10 MED ORDER — METOPROLOL TARTRATE 25 MG PO TABS
12.5000 mg | ORAL_TABLET | Freq: Two times a day (BID) | ORAL | Status: DC
  Administered 2023-01-10: 12.5 mg via ORAL
  Filled 2023-01-10: qty 1

## 2023-01-10 MED ORDER — ROSUVASTATIN CALCIUM 20 MG PO TABS
20.0000 mg | ORAL_TABLET | Freq: Every day | ORAL | Status: DC
  Administered 2023-01-10: 20 mg via ORAL
  Filled 2023-01-10: qty 1

## 2023-01-10 MED ORDER — ALBUTEROL SULFATE (2.5 MG/3ML) 0.083% IN NEBU: 2.5000 mg | INHALATION_SOLUTION | RESPIRATORY_TRACT | Status: DC | PRN

## 2023-01-10 NOTE — Discharge Summary (Signed)
[facility] Discharge Summary  Patient name: Martha Mora Medical record number: [id number] Date of birth: Dec 26, 1945 Age: 77 y.o. Gender: female Date of Admission: 01/09/2023  Date of Discharge: 01/10/2023 Admitting Physician: Leslie Dales, DO  Primary Care Provider: Gerrit Heck, MD Consultants: Cardiology  Indication for Hospitalization: Afib with RVR  Discharge Diagnoses/Problem List:  Principal Problem for Admission: Afib with RVR Other Problems addressed during stay:  Principal Problem:   Elevated troponin Active Problems:   Atrial fibrillation ([place])   Chronic heart failure with preserved ejection fraction ([place])   Centrilobular emphysema ([place])   Anemia   Brief Hospital Course:  Yunalesca Muessig is a 77 y.o.female with a history of cardiac stents x 4, OSA, A-fib, HLD, depression/anxiety, HTN, HFpEF and centrilobular emphysema who was admitted to the [facility] Medicine Teaching Service at [facility] for chest pain. Her hospital course is detailed below:  Afib with RVR Elevated troponin Presented with chest pain and found to have afib with RVR. Stable on Eliquis outpatient so received cardioversion in ED. Successfully converted to NSR and placed on heparin drip. Post-conversion EKG stable but troponin trended from 29 to peak of 979. Likely in the setting of demand ischemia. Cardiology fellow consulted and recommend cessation of trop and EKG trending and updated echo. Echo EF 65-70% with moderate LVH and possible worsening diastolic HF. Cardiology Dr. [name] evaluated patient and recommended discharge on Amiodarone '200mg'$  daily and Plavix in addition to Eliquis. Cardiology notified and will schedule f/u with Dr. [name].  Other chronic conditions were medically managed with home medications and formulary alternatives as necessary (anxiety, HTN, HFpEF)  PCP Follow-up Recommendations:  Cardiology f/u with Dr. [name] in 1-2 weeks  Disposition:  Home  Discharge Condition: Stable  Discharge Exam:  Vitals:   01/10/23 1046 01/10/23 1245  BP: (!) 142/84 124/84  Pulse: 76 78  Resp: 16 18  Temp: 98.5 F (36.9 C) 98.5 F (36.9 C)  SpO2:  94%   General: Well-appearing, sitting at edge of the bed. NAD Cardiovascular: Tachycardia. Regular rhythm. No murmur Respiratory: Mild decreased breath sounds at bases. No wheezing or crackles Abdomen: Soft, non-tender, non-distended Extremities: Mild peripheral edema bilaterally  Significant Procedures: Cardioversion  Significant Labs and Imaging:  Recent Labs  Lab 01/09/23 1142 01/10/23 0201  WBC 10.9* 13.6*  HGB 9.4* 9.4*  HCT 33.2* 33.7*  PLT PLATELET CLUMPS NOTED ON SMEAR, UNABLE TO ESTIMATE 289   Recent Labs  Lab 01/09/23 1300 01/10/23 0201  NA 141 139  K 4.2 4.0  CL 111 103  CO2 21* 28  GLUCOSE 115* 124*  BUN 12 16  CREATININE 0.80 0.80  CALCIUM 6.9* 8.8*  MG 1.5* 1.8    Pertinent Imaging: DG Chest Portable 1 View Result Date: 01/09/2023 IMPRESSION: Findings suggest CHF. Bibasilar subsegmental atelectasis.   Results/Tests Pending at Time of Discharge: None  Discharge Medications:  Allergies as of 01/10/2023       Reactions   Keflex [cephalexin] Other (See Comments)   "Made me feel funny" Tolerated Rocephin in April 2020 over several days and 07/01/19 in ED.   Penicillins Rash, Other (See Comments)   03/01/19 tolerated Zosyn Red bumps all over stomach Did it involve swelling of the face/tongue/throat, SOB, or low BP? No Did it involve sudden or severe rash/hives, skin peeling, or any reaction on the inside of your mouth or nose? Yes Did you need to seek medical attention at a hospital or doctor's office? Yes When did it last  happen?      30+ years  If all above answers are "NO", may proceed with cephalosporin use.   Requip Xl [ropinirole Hcl] Other (See Comments)   Akathisia     Shellfish Allergy Nausea And Vomiting        Medication List     STOP  taking these medications    aspirin 81 MG chewable tablet   levalbuterol 0.63 MG/3ML nebulizer solution Commonly known as: Xopenex   NON FORMULARY       TAKE these medications    acetaminophen 325 MG tablet Commonly known as: TYLENOL Take 2 tablets (650 mg total) by mouth every 6 (six) hours as needed for mild pain, fever or headache. What changed: how much to take   albuterol 108 (90 Base) MCG/ACT inhaler Commonly known as: VENTOLIN HFA TAKE 1-2 INHALATIONS EVERY 4-6 HOURS AS NEEDED FOR WHEEZING. What changed:  how much to take how to take this when to take this reasons to take this additional instructions   amiodarone 200 MG tablet Commonly known as: Pacerone Take 1 tablet (200 mg total) by mouth daily.   busPIRone 15 MG tablet Commonly known as: BUSPAR Take 1 tablet (15 mg total) by mouth 3 (three) times daily.   Calcium Carb-Cholecalciferol 600-800 MG-UNIT Tabs Take 1 tablet by mouth 2 (two) times a day.   clopidogrel 75 MG tablet Commonly known as: PLAVIX Take 1 tablet (75 mg total) by mouth daily with breakfast.   DULoxetine 60 MG capsule Commonly known as: CYMBALTA TAKE 1 CAPSULE BY MOUTH EVERY DAY What changed:  how much to take how to take this when to take this additional instructions   Eliquis 5 MG Tabs tablet Generic drug: apixaban TAKE 1 TABLET BY MOUTH TWICE  DAILY   Iron 325 (65 Fe) MG Tabs Take 1 tablet (325 mg total) by mouth every other day. What changed: when to take this   loratadine 10 MG tablet Commonly known as: CLARITIN Take 1 tablet (10 mg total) by mouth daily.   metoprolol tartrate 25 MG tablet Commonly known as: LOPRESSOR TAKE 1/2 TABLET BY MOUTH TWICE A DAY   multivitamin tablet Take 1 tablet by mouth daily. Herbal Life multivitamin   nitroGLYCERIN 0.4 MG SL tablet Commonly known as: NITROSTAT Place 1 tablet (0.4 mg total) under the tongue every 5 (five) minutes x 3 doses as needed for chest pain.   pantoprazole  40 MG tablet Commonly known as: PROTONIX Take 1 tablet (40 mg total) by mouth daily.   pregabalin 150 MG capsule Commonly known as: LYRICA TAKE 1 CAPSULE BY MOUTH TWICE A DAY   PRESERVISION AREDS 2 PO Take 1 tablet by mouth daily.   Prolia 60 MG/ML Sosy injection Generic drug: denosumab Inject 60 mg into the skin every 6 (six) months.   rosuvastatin 20 MG tablet Commonly known as: CRESTOR Take 1 tablet (20 mg total) by mouth daily.   Spiriva Respimat 2.5 MCG/ACT Aers Generic drug: Tiotropium Bromide Monohydrate Inhale 2 puffs into the lungs daily.   traZODone 50 MG tablet Commonly known as: DESYREL TAKE 1 TABLET BY MOUTH EVERYDAY AT BEDTIME What changed: See the new instructions.   Vitamin D (Ergocalciferol) 1.25 MG (50000 UNIT) Caps capsule Commonly known as: DRISDOL TAKE 1 CAPSULE BY MOUTH ONE TIME PER WEEK What changed: See the new instructions.        Discharge Instructions: Please refer to Patient Instructions section of EMR for full details.  Patient was counseled important signs and  symptoms that should prompt return to medical care, changes in medications, dietary instructions, activity restrictions, and follow up appointments.   Follow-Up Appointments:  Follow-up Information     Gerrit Heck, MD Follow up.   Specialty: Family Medicine Why: 1:50 PM (arrive at 1:40) Contact information: [place] [address] [phone]         [name], MD Follow up.   Specialty: Cardiology Contact information: [place] [address] [phone]                 Colletta Maryland, MD 01/10/2023, 1:18 PM PGY-1, [place]

## 2023-01-10 NOTE — Progress Notes (Addendum)
     Daily Progress Note Intern Pager: [phone]  Patient name: Martha Mora Medical record number: [id number] Date of birth: 1946-02-17 Age: 77 y.o. Gender: female  Primary Care Provider: Gerrit Heck, MD Consultants: Cardiology Code Status: Full code which was confirmed with patient   Pt Overview and Major Events to Date:  2/22: Admitted to FMTS  Assessment and Plan: Martha Mora is a 77 y.o. female presenting with shoulder/chest pain found to have afib with RVR.  PMHx includes: cardiac stents x 4, OSA, A-fib, HLD, depression/anxiety, HTN, HFpEF and centrilobular emphysema.   * Elevated troponin No further trending of troponins. Per cardiology fellow: continue heparin overnight, obtain morning echocardiogram, no need to trend troponins further.  We will observe patient and repeat troponins/EKG if patient develops symptoms. Dr. [name] to see patient this AM. -Echo pending -Continue telemetry  Atrial fibrillation [facility]) S/p cardioversion in ED, now in NSR. Mildly tachycardic to 100s this AM. Asymptomatic. -Heparin per pharmacy, transition back to Eliquis pending Cards rec -Restart home Metoprolol 12.31m BID -Goal K>4, Mg>2  Chronic heart failure with preserved ejection fraction (HCC) Mild peripheral edema, denies orthopnea and negative for crackles on exam. EF 55-60% in 08/2022. -Echo pending  Anemia Hgb 9.4, stable from prior and appears close to baseline Hgb ~10.  Centrilobular emphysema (HCC) On home oxygen 2.5L, breathing comfortably and lung exam reassuring. - Restart home Spiriva and albuterol    FEN/GI: Heart healthy PPx: Heparin Dispo: Home pending cardiology evaluation  Subjective:  Patient assessed at bedside while sitting up stretching her back. States she feels well and does not have chest pain. Reports chronic back pain that is ongoing.  Objective: Temp:  [97.8 F (36.6 C)-98.8 F (37.1 C)] 97.8 F (36.6 C) (02/23 0742) Pulse Rate:   [54-156] 95 (02/23 0745) Resp:  [17-30] 20 (02/23 0745) BP: (96-147)/(44-133) 102/44 (02/23 0745) SpO2:  [92 %-100 %] 96 % (02/23 0745) Weight:  [104.3 kg] 104.3 kg (02/22 1135) Physical Exam: General: Well-appearing, sitting at edge of the bed. NAD Cardiovascular: Tachycardia. Regular rhythm. No murmur Respiratory: Mild decreased breath sounds at bases. No wheezing or crackles Abdomen: Soft, non-tender, non-distended Extremities: Mild peripheral edema bilaterally  Laboratory: Most recent CBC Lab Results  Component Value Date   WBC 13.6 (H) 01/10/2023   HGB 9.4 (L) 01/10/2023   HCT 33.7 (L) 01/10/2023   MCV 96.6 01/10/2023   PLT 289 01/10/2023   Most recent BMP    Latest Ref Rng & Units 01/10/2023    2:01 AM  BMP  Glucose 70 - 99 mg/dL 124   BUN 8 - 23 mg/dL 16   Creatinine 0.44 - 1.00 mg/dL 0.80   Sodium 135 - 145 mmol/L 139   Potassium 3.5 - 5.1 mmol/L 4.0   Chloride 98 - 111 mmol/L 103   CO2 22 - 32 mmol/L 28   Calcium 8.9 - 10.3 mg/dL 8.8     Other pertinent labs: PTT: 40 Trop: 737>949 Mag 1.8  Imaging/Diagnostic Tests: DG Chest Portable 1 View Result Date: 01/09/2023 IMPRESSION: Findings suggest CHF. Bibasilar subsegmental atelectasis.   HColletta Maryland MD 01/10/2023, 9:18 AM  PGY-1, CUniversity of VirginiaIntern pager: [phone] text pages welcome Secure chat group [place]
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
## 2023-01-10 NOTE — Progress Notes (Signed)
  Echocardiogram 2D Echocardiogram has been performed.  Martha Mora 01/10/2023, 10:38 AM

## 2023-01-10 NOTE — Discharge Planning (Signed)
  Transition of Care [facility]) Screening Note   Patient Details  Name: Nayleen Cowing Date of Birth: 1946/06/16   Transition of Care [facility]) CM/SW Contact:    Fuller Mandril, RN Phone Number: 01/10/2023, 2:43 PM    Transition of Care Department [facility]) has reviewed patient and no TOC needs have been identified at this time. We will continue to monitor patient advancement through interdisciplinary progression rounds. If new patient transition needs arise, please place a TOC consult.

## 2023-01-10 NOTE — ED Notes (Signed)
Educated patient to use call light and wait for assistance before ambulation, to reduce the risks of falls. Patient continues to get out of bed without using call light or waiting for assistance. Patient refuses fall preventions socks, fall bracelet applied to patient's right wrist

## 2023-01-10 NOTE — Consult Note (Signed)
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 clopidogrel but okay to DC aspirin Increase amiodarone to 200 mg daily Patient will need anemia workup as outpatient Follow-up with Dr. [name] in 1 to 2 weeks Okay to discharge from cardiac point of view  Charolette Forward 01/10/2023, 12:14 PM

## 2023-01-10 NOTE — ED Notes (Signed)
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 (52.2 mg Intravenous Given by Other 01/09/23 1401)  magnesium oxide (MAG-OX) tablet 800 mg (800 mg Oral Given 01/09/23 1654)  magnesium sulfate IVPB 2 g 50  mL (0 g Intravenous Stopped 01/10/23 0752)    Mobility walks     Focused Assessments Cardiac Assessment Handoff:    Lab Results  Component Value Date   TROPONINI 1.82 (HH) 03/04/2019   Lab Results  Component Value Date   DDIMER 4.88 (H) 02/22/2019   Does the Patient currently have chest pain? No   , Pulmonary Assessment Handoff:  Lung sounds:   O2 Device: Nasal Cannula O2 Flow Rate (L/min): 2 L/min    R Recommendations: See Admitting Provider Note  Report given to:   Additional Notes: PTT INR @ 1500

## 2023-01-10 NOTE — ED Notes (Signed)
Pt walked taken out of ed with sone in wheelchair, stable condition.
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
## 2023-01-13 ENCOUNTER — Inpatient Hospital Stay: Payer: Self-pay | Admitting: Student

## 2023-01-13 NOTE — Progress Notes (Incomplete)
    SUBJECTIVE:   CHIEF COMPLAINT / HPI:   Recent admission for Afib RVR with cardioversion. Was put on increased dose of amiodarone by cardiology and continuing plavix and eliquis.   PCP Follow-up Recommendations:  Cardiology f/u with Dr. [name] in 1-2 weeks  PERTINENT  PMH / PSH: ***  OBJECTIVE:   There were no vitals taken for this visit.  ***  ASSESSMENT/PLAN:   No problem-specific Assessment & Plan notes found for this encounter.     Gerrit Heck, MD [place]

## 2023-01-14 ENCOUNTER — Inpatient Hospital Stay: Payer: Medicare Other | Admitting: Nurse Practitioner

## 2023-01-31 DIAGNOSIS — J449 Chronic obstructive pulmonary disease, unspecified: Secondary | ICD-10-CM | POA: Diagnosis not present

## 2023-01-31 DIAGNOSIS — R531 Weakness: Secondary | ICD-10-CM | POA: Diagnosis not present

## 2023-01-31 DIAGNOSIS — R0600 Dyspnea, unspecified: Secondary | ICD-10-CM | POA: Diagnosis not present

## 2023-01-31 DIAGNOSIS — R0902 Hypoxemia: Secondary | ICD-10-CM | POA: Diagnosis not present

## 2023-02-02 ENCOUNTER — Encounter: Payer: Self-pay | Admitting: Student

## 2023-02-03 ENCOUNTER — Ambulatory Visit
Admission: RE | Admit: 2023-02-03 | Discharge: 2023-02-03 | Disposition: A | Discharge status: Home / Self Care | Payer: Medicare Other | Source: Ambulatory Visit | Attending: Hematology | Admitting: Hematology

## 2023-02-03 DIAGNOSIS — Z1231 Encounter for screening mammogram for malignant neoplasm of breast: Secondary | ICD-10-CM

## 2023-02-05 ENCOUNTER — Encounter (INDEPENDENT_AMBULATORY_CARE_PROVIDER_SITE_OTHER): Payer: Medicare Other | Admitting: Ophthalmology

## 2023-02-10 ENCOUNTER — Inpatient Hospital Stay (HOSPITAL_COMMUNITY): Payer: Medicare Other

## 2023-02-10 ENCOUNTER — Emergency Department (HOSPITAL_COMMUNITY): Payer: Medicare Other

## 2023-02-10 ENCOUNTER — Inpatient Hospital Stay (HOSPITAL_COMMUNITY)
Admission: EM | Admit: 2023-02-10 | Discharge: 2023-02-17 | DRG: 871 | Disposition: E | Payer: Medicare Other | Attending: Critical Care Medicine | Admitting: Critical Care Medicine

## 2023-02-10 DIAGNOSIS — Z6841 Body Mass Index (BMI) 40.0 and over, adult: Secondary | ICD-10-CM

## 2023-02-10 DIAGNOSIS — Z8249 Family history of ischemic heart disease and other diseases of the circulatory system: Secondary | ICD-10-CM

## 2023-02-10 DIAGNOSIS — M810 Age-related osteoporosis without current pathological fracture: Secondary | ICD-10-CM | POA: Diagnosis present

## 2023-02-10 DIAGNOSIS — I2510 Atherosclerotic heart disease of native coronary artery without angina pectoris: Secondary | ICD-10-CM | POA: Diagnosis present

## 2023-02-10 DIAGNOSIS — R571 Hypovolemic shock: Secondary | ICD-10-CM

## 2023-02-10 DIAGNOSIS — J13 Pneumonia due to Streptococcus pneumoniae: Secondary | ICD-10-CM | POA: Diagnosis present

## 2023-02-10 DIAGNOSIS — I110 Hypertensive heart disease with heart failure: Secondary | ICD-10-CM | POA: Diagnosis not present

## 2023-02-10 DIAGNOSIS — J90 Pleural effusion, not elsewhere classified: Secondary | ICD-10-CM | POA: Diagnosis not present

## 2023-02-10 DIAGNOSIS — E161 Other hypoglycemia: Secondary | ICD-10-CM | POA: Diagnosis not present

## 2023-02-10 DIAGNOSIS — Z66 Do not resuscitate: Secondary | ICD-10-CM | POA: Diagnosis not present

## 2023-02-10 DIAGNOSIS — J9811 Atelectasis: Secondary | ICD-10-CM | POA: Diagnosis not present

## 2023-02-10 DIAGNOSIS — F419 Anxiety disorder, unspecified: Secondary | ICD-10-CM | POA: Diagnosis present

## 2023-02-10 DIAGNOSIS — K7200 Acute and subacute hepatic failure without coma: Secondary | ICD-10-CM | POA: Diagnosis present

## 2023-02-10 DIAGNOSIS — J432 Centrilobular emphysema: Secondary | ICD-10-CM | POA: Diagnosis present

## 2023-02-10 DIAGNOSIS — R739 Hyperglycemia, unspecified: Secondary | ICD-10-CM | POA: Diagnosis present

## 2023-02-10 DIAGNOSIS — E162 Hypoglycemia, unspecified: Secondary | ICD-10-CM | POA: Diagnosis present

## 2023-02-10 DIAGNOSIS — I5032 Chronic diastolic (congestive) heart failure: Secondary | ICD-10-CM | POA: Diagnosis present

## 2023-02-10 DIAGNOSIS — Z85828 Personal history of other malignant neoplasm of skin: Secondary | ICD-10-CM

## 2023-02-10 DIAGNOSIS — R6521 Severe sepsis with septic shock: Secondary | ICD-10-CM | POA: Diagnosis not present

## 2023-02-10 DIAGNOSIS — D689 Coagulation defect, unspecified: Secondary | ICD-10-CM | POA: Diagnosis not present

## 2023-02-10 DIAGNOSIS — K921 Melena: Secondary | ICD-10-CM | POA: Diagnosis present

## 2023-02-10 DIAGNOSIS — K5730 Diverticulosis of large intestine without perforation or abscess without bleeding: Secondary | ICD-10-CM | POA: Diagnosis not present

## 2023-02-10 DIAGNOSIS — I480 Paroxysmal atrial fibrillation: Secondary | ICD-10-CM | POA: Diagnosis present

## 2023-02-10 DIAGNOSIS — Z7902 Long term (current) use of antithrombotics/antiplatelets: Secondary | ICD-10-CM

## 2023-02-10 DIAGNOSIS — Z1152 Encounter for screening for COVID-19: Secondary | ICD-10-CM | POA: Diagnosis not present

## 2023-02-10 DIAGNOSIS — Z800 Family history of malignant neoplasm of digestive organs: Secondary | ICD-10-CM

## 2023-02-10 DIAGNOSIS — E872 Acidosis, unspecified: Secondary | ICD-10-CM | POA: Diagnosis not present

## 2023-02-10 DIAGNOSIS — G9340 Encephalopathy, unspecified: Secondary | ICD-10-CM | POA: Diagnosis not present

## 2023-02-10 DIAGNOSIS — D62 Acute posthemorrhagic anemia: Secondary | ICD-10-CM | POA: Diagnosis not present

## 2023-02-10 DIAGNOSIS — Z923 Personal history of irradiation: Secondary | ICD-10-CM

## 2023-02-10 DIAGNOSIS — Z825 Family history of asthma and other chronic lower respiratory diseases: Secondary | ICD-10-CM

## 2023-02-10 DIAGNOSIS — N179 Acute kidney failure, unspecified: Secondary | ICD-10-CM | POA: Diagnosis present

## 2023-02-10 DIAGNOSIS — G9341 Metabolic encephalopathy: Secondary | ICD-10-CM | POA: Diagnosis present

## 2023-02-10 DIAGNOSIS — J440 Chronic obstructive pulmonary disease with acute lower respiratory infection: Secondary | ICD-10-CM | POA: Diagnosis present

## 2023-02-10 DIAGNOSIS — I252 Old myocardial infarction: Secondary | ICD-10-CM

## 2023-02-10 DIAGNOSIS — Z803 Family history of malignant neoplasm of breast: Secondary | ICD-10-CM

## 2023-02-10 DIAGNOSIS — Z743 Need for continuous supervision: Secondary | ICD-10-CM | POA: Diagnosis not present

## 2023-02-10 DIAGNOSIS — G2581 Restless legs syndrome: Secondary | ICD-10-CM | POA: Diagnosis not present

## 2023-02-10 DIAGNOSIS — F32A Depression, unspecified: Secondary | ICD-10-CM | POA: Diagnosis not present

## 2023-02-10 DIAGNOSIS — E669 Obesity, unspecified: Secondary | ICD-10-CM | POA: Diagnosis present

## 2023-02-10 DIAGNOSIS — Z955 Presence of coronary angioplasty implant and graft: Secondary | ICD-10-CM

## 2023-02-10 DIAGNOSIS — Z9049 Acquired absence of other specified parts of digestive tract: Secondary | ICD-10-CM

## 2023-02-10 DIAGNOSIS — J80 Acute respiratory distress syndrome: Secondary | ICD-10-CM | POA: Diagnosis not present

## 2023-02-10 DIAGNOSIS — N200 Calculus of kidney: Secondary | ICD-10-CM | POA: Diagnosis not present

## 2023-02-10 DIAGNOSIS — Z853 Personal history of malignant neoplasm of breast: Secondary | ICD-10-CM

## 2023-02-10 DIAGNOSIS — Z818 Family history of other mental and behavioral disorders: Secondary | ICD-10-CM

## 2023-02-10 DIAGNOSIS — E11649 Type 2 diabetes mellitus with hypoglycemia without coma: Secondary | ICD-10-CM | POA: Diagnosis not present

## 2023-02-10 DIAGNOSIS — I2489 Other forms of acute ischemic heart disease: Secondary | ICD-10-CM | POA: Diagnosis not present

## 2023-02-10 DIAGNOSIS — I5030 Unspecified diastolic (congestive) heart failure: Secondary | ICD-10-CM | POA: Diagnosis not present

## 2023-02-10 DIAGNOSIS — A419 Sepsis, unspecified organism: Secondary | ICD-10-CM | POA: Diagnosis not present

## 2023-02-10 DIAGNOSIS — M7989 Other specified soft tissue disorders: Secondary | ICD-10-CM

## 2023-02-10 DIAGNOSIS — R578 Other shock: Secondary | ICD-10-CM | POA: Diagnosis present

## 2023-02-10 DIAGNOSIS — Z4682 Encounter for fitting and adjustment of non-vascular catheter: Secondary | ICD-10-CM | POA: Diagnosis not present

## 2023-02-10 DIAGNOSIS — G4733 Obstructive sleep apnea (adult) (pediatric): Secondary | ICD-10-CM | POA: Diagnosis present

## 2023-02-10 DIAGNOSIS — Z981 Arthrodesis status: Secondary | ICD-10-CM

## 2023-02-10 DIAGNOSIS — I10 Essential (primary) hypertension: Secondary | ICD-10-CM | POA: Diagnosis not present

## 2023-02-10 DIAGNOSIS — F17290 Nicotine dependence, other tobacco product, uncomplicated: Secondary | ICD-10-CM | POA: Diagnosis present

## 2023-02-10 DIAGNOSIS — Z515 Encounter for palliative care: Secondary | ICD-10-CM | POA: Diagnosis not present

## 2023-02-10 DIAGNOSIS — Z811 Family history of alcohol abuse and dependence: Secondary | ICD-10-CM

## 2023-02-10 DIAGNOSIS — Z7901 Long term (current) use of anticoagulants: Secondary | ICD-10-CM

## 2023-02-10 DIAGNOSIS — R404 Transient alteration of awareness: Secondary | ICD-10-CM | POA: Diagnosis not present

## 2023-02-10 DIAGNOSIS — Z880 Allergy status to penicillin: Secondary | ICD-10-CM

## 2023-02-10 DIAGNOSIS — A403 Sepsis due to Streptococcus pneumoniae: Secondary | ICD-10-CM | POA: Diagnosis not present

## 2023-02-10 DIAGNOSIS — Z9981 Dependence on supplemental oxygen: Secondary | ICD-10-CM

## 2023-02-10 DIAGNOSIS — Z823 Family history of stroke: Secondary | ICD-10-CM

## 2023-02-10 DIAGNOSIS — R6889 Other general symptoms and signs: Secondary | ICD-10-CM | POA: Diagnosis not present

## 2023-02-10 DIAGNOSIS — Z79899 Other long term (current) drug therapy: Secondary | ICD-10-CM

## 2023-02-10 DIAGNOSIS — Z4659 Encounter for fitting and adjustment of other gastrointestinal appliance and device: Secondary | ICD-10-CM | POA: Diagnosis not present

## 2023-02-10 DIAGNOSIS — F17210 Nicotine dependence, cigarettes, uncomplicated: Secondary | ICD-10-CM | POA: Diagnosis present

## 2023-02-10 DIAGNOSIS — E785 Hyperlipidemia, unspecified: Secondary | ICD-10-CM | POA: Diagnosis present

## 2023-02-10 DIAGNOSIS — Z91013 Allergy to seafood: Secondary | ICD-10-CM

## 2023-02-10 LAB — POCT I-STAT 7, (LYTES, BLD GAS, ICA,H+H)
Acid-base deficit: 22 mmol/L — ABNORMAL HIGH (ref 0.0–2.0)
Acid-base deficit: 13 mmol/L — ABNORMAL HIGH (ref 0.0–2.0)
Acid-base deficit: 9 mmol/L — ABNORMAL HIGH (ref 0.0–2.0)
Acid-base deficit: 2 mmol/L (ref 0.0–2.0)
Bicarbonate: 8.4 mmol/L — ABNORMAL LOW (ref 20.0–28.0)
Bicarbonate: 15.5 mmol/L — ABNORMAL LOW (ref 20.0–28.0)
Bicarbonate: 18.3 mmol/L — ABNORMAL LOW (ref 20.0–28.0)
Bicarbonate: 25.4 mmol/L (ref 20.0–28.0)
Calcium, Ion: 1.11 mmol/L — ABNORMAL LOW (ref 1.15–1.40)
Calcium, Ion: 1.03 mmol/L — ABNORMAL LOW (ref 1.15–1.40)
Calcium, Ion: 1.08 mmol/L — ABNORMAL LOW (ref 1.15–1.40)
Calcium, Ion: 1.09 mmol/L — ABNORMAL LOW (ref 1.15–1.40)
HCT: 17 % — ABNORMAL LOW (ref 36.0–46.0)
HCT: 26 % — ABNORMAL LOW (ref 36.0–46.0)
HCT: 24 % — ABNORMAL LOW (ref 36.0–46.0)
HCT: 24 % — ABNORMAL LOW (ref 36.0–46.0)
Hemoglobin: 5.8 g/dL — CL (ref 12.0–15.0)
Hemoglobin: 8.8 g/dL — ABNORMAL LOW (ref 12.0–15.0)
Hemoglobin: 8.2 g/dL — ABNORMAL LOW (ref 12.0–15.0)
Hemoglobin: 8.2 g/dL — ABNORMAL LOW (ref 12.0–15.0)
O2 Saturation: 99 %
O2 Saturation: 97 %
O2 Saturation: 88 %
O2 Saturation: 95 %
Patient temperature: 34.9
Patient temperature: 36.6
Patient temperature: 36.6
Patient temperature: 36.6
Potassium: 4.8 mmol/L (ref 3.5–5.1)
Potassium: 4.7 mmol/L (ref 3.5–5.1)
Potassium: 4 mmol/L (ref 3.5–5.1)
Potassium: 3.7 mmol/L (ref 3.5–5.1)
Sodium: 140 mmol/L (ref 135–145)
Sodium: 139 mmol/L (ref 135–145)
Sodium: 140 mmol/L (ref 135–145)
Sodium: 141 mmol/L (ref 135–145)
TCO2: 10 mmol/L — ABNORMAL LOW (ref 22–32)
TCO2: 17 mmol/L — ABNORMAL LOW (ref 22–32)
TCO2: 20 mmol/L — ABNORMAL LOW (ref 22–32)
TCO2: 27 mmol/L (ref 22–32)
pCO2 arterial: 38.4 mmHg (ref 32–48)
pCO2 arterial: 47.2 mmHg (ref 32–48)
pCO2 arterial: 47.2 mmHg (ref 32–48)
pCO2 arterial: 57.3 mmHg — ABNORMAL HIGH (ref 32–48)
pH, Arterial: 6.93 — CL (ref 7.35–7.45)
pH, Arterial: 7.123 — CL (ref 7.35–7.45)
pH, Arterial: 7.195 — CL (ref 7.35–7.45)
pH, Arterial: 7.253 — ABNORMAL LOW (ref 7.35–7.45)
pO2, Arterial: 217 mmHg — ABNORMAL HIGH (ref 83–108)
pO2, Arterial: 118 mmHg — ABNORMAL HIGH (ref 83–108)
pO2, Arterial: 65 mmHg — ABNORMAL LOW (ref 83–108)
pO2, Arterial: 86 mmHg (ref 83–108)

## 2023-02-10 LAB — CBC WITH DIFFERENTIAL/PLATELET
Abs Immature Granulocytes: 0.4 10*3/uL — ABNORMAL HIGH (ref 0.00–0.07)
Basophils Absolute: 0 10*3/uL (ref 0.0–0.1)
Basophils Relative: 0 %
Eosinophils Absolute: 0 10*3/uL (ref 0.0–0.5)
Eosinophils Relative: 0 %
HCT: 23.8 % — ABNORMAL LOW (ref 36.0–46.0)
Hemoglobin: 5.8 g/dL — CL (ref 12.0–15.0)
Lymphocytes Relative: 5 %
Lymphs Abs: 2 10*3/uL (ref 0.7–4.0)
MCH: 25.8 pg — ABNORMAL LOW (ref 26.0–34.0)
MCHC: 24.4 g/dL — ABNORMAL LOW (ref 30.0–36.0)
MCV: 105.8 fL — ABNORMAL HIGH (ref 80.0–100.0)
Metamyelocytes Relative: 1 %
Monocytes Absolute: 2 10*3/uL — ABNORMAL HIGH (ref 0.1–1.0)
Monocytes Relative: 5 %
Neutro Abs: 35.2 10*3/uL — ABNORMAL HIGH (ref 1.7–7.7)
Neutrophils Relative %: 89 %
Platelets: 376 10*3/uL (ref 150–400)
RBC: 2.25 MIL/uL — ABNORMAL LOW (ref 3.87–5.11)
RDW: 15.9 % — ABNORMAL HIGH (ref 11.5–15.5)
WBC: 39.6 10*3/uL — ABNORMAL HIGH (ref 4.0–10.5)
nRBC: 1 % — ABNORMAL HIGH (ref 0.0–0.2)
nRBC: 1 /100 WBC — ABNORMAL HIGH

## 2023-02-10 LAB — PREPARE RBC (CROSSMATCH)

## 2023-02-10 LAB — I-STAT VENOUS BLOOD GAS, ED
Acid-base deficit: 23 mmol/L — ABNORMAL HIGH (ref 0.0–2.0)
Bicarbonate: 8 mmol/L — ABNORMAL LOW (ref 20.0–28.0)
Calcium, Ion: 1.05 mmol/L — ABNORMAL LOW (ref 1.15–1.40)
HCT: 19 % — ABNORMAL LOW (ref 36.0–46.0)
Hemoglobin: 6.5 g/dL — CL (ref 12.0–15.0)
O2 Saturation: 95 %
Potassium: 5.4 mmol/L — ABNORMAL HIGH (ref 3.5–5.1)
Sodium: 138 mmol/L (ref 135–145)
TCO2: 9 mmol/L — ABNORMAL LOW (ref 22–32)
pCO2, Ven: 39.2 mmHg — ABNORMAL LOW (ref 44–60)
pH, Ven: 6.917 — CL (ref 7.25–7.43)
pO2, Ven: 123 mmHg — ABNORMAL HIGH (ref 32–45)

## 2023-02-10 LAB — ECHOCARDIOGRAM LIMITED: Weight: 3876.57 oz

## 2023-02-10 LAB — GLUCOSE, CAPILLARY
Glucose-Capillary: 199 mg/dL — ABNORMAL HIGH (ref 70–99)
Glucose-Capillary: 277 mg/dL — ABNORMAL HIGH (ref 70–99)
Glucose-Capillary: 199 mg/dL — ABNORMAL HIGH (ref 70–99)
Glucose-Capillary: 230 mg/dL — ABNORMAL HIGH (ref 70–99)
Glucose-Capillary: 39 mg/dL — CL (ref 70–99)
Glucose-Capillary: 52 mg/dL — ABNORMAL LOW (ref 70–99)
Glucose-Capillary: 63 mg/dL — ABNORMAL LOW (ref 70–99)
Glucose-Capillary: 73 mg/dL (ref 70–99)
Glucose-Capillary: 165 mg/dL — ABNORMAL HIGH (ref 70–99)
Glucose-Capillary: 181 mg/dL — ABNORMAL HIGH (ref 70–99)
Glucose-Capillary: 169 mg/dL — ABNORMAL HIGH (ref 70–99)
Glucose-Capillary: 179 mg/dL — ABNORMAL HIGH (ref 70–99)
Glucose-Capillary: 163 mg/dL — ABNORMAL HIGH (ref 70–99)
Glucose-Capillary: 168 mg/dL — ABNORMAL HIGH (ref 70–99)

## 2023-02-10 LAB — BASIC METABOLIC PANEL
Anion gap: 20 — ABNORMAL HIGH (ref 5–15)
Anion gap: 15 (ref 5–15)
BUN: 64 mg/dL — ABNORMAL HIGH (ref 8–23)
BUN: 71 mg/dL — ABNORMAL HIGH (ref 8–23)
CO2: 18 mmol/L — ABNORMAL LOW (ref 22–32)
CO2: 25 mmol/L (ref 22–32)
Calcium: 7.8 mg/dL — ABNORMAL LOW (ref 8.9–10.3)
Calcium: 8.1 mg/dL — ABNORMAL LOW (ref 8.9–10.3)
Chloride: 102 mmol/L (ref 98–111)
Chloride: 99 mmol/L (ref 98–111)
Creatinine, Ser: 2.41 mg/dL — ABNORMAL HIGH (ref 0.44–1.00)
Creatinine, Ser: 2.71 mg/dL — ABNORMAL HIGH (ref 0.44–1.00)
GFR, Estimated: 20 mL/min — ABNORMAL LOW (ref >60)
GFR, Estimated: 17 mL/min — ABNORMAL LOW (ref >60)
Glucose, Bld: 237 mg/dL — ABNORMAL HIGH (ref 70–99)
Glucose, Bld: 160 mg/dL — ABNORMAL HIGH (ref 70–99)
Potassium: 3.9 mmol/L (ref 3.5–5.1)
Potassium: 4.2 mmol/L (ref 3.5–5.1)
Sodium: 140 mmol/L (ref 135–145)
Sodium: 139 mmol/L (ref 135–145)

## 2023-02-10 LAB — LACTIC ACID, PLASMA
Lactic Acid, Venous: >9 mmol/L (ref 0.5–1.9)
Lactic Acid, Venous: >9 mmol/L (ref 0.5–1.9)
Lactic Acid, Venous: >9 mmol/L (ref 0.5–1.9)

## 2023-02-10 LAB — RAPID URINE DRUG SCREEN, HOSP PERFORMED
Amphetamines: NOT DETECTED
Barbiturates: NOT DETECTED
Benzodiazepines: NOT DETECTED
Cocaine: NOT DETECTED
Opiates: NOT DETECTED
Tetrahydrocannabinol: NOT DETECTED

## 2023-02-10 LAB — I-STAT CHEM 8, ED
BUN: 65 mg/dL — ABNORMAL HIGH (ref 8–23)
Calcium, Ion: 1.07 mmol/L — ABNORMAL LOW (ref 1.15–1.40)
Chloride: 109 mmol/L (ref 98–111)
Creatinine, Ser: 2.4 mg/dL — ABNORMAL HIGH (ref 0.44–1.00)
Glucose, Bld: 53 mg/dL — ABNORMAL LOW (ref 70–99)
HCT: 22 % — ABNORMAL LOW (ref 36.0–46.0)
Hemoglobin: 7.5 g/dL — ABNORMAL LOW (ref 12.0–15.0)
Potassium: 5.2 mmol/L — ABNORMAL HIGH (ref 3.5–5.1)
Sodium: 142 mmol/L (ref 135–145)
TCO2: 12 mmol/L — ABNORMAL LOW (ref 22–32)

## 2023-02-10 LAB — URINALYSIS, ROUTINE W REFLEX MICROSCOPIC
Bacteria, UA: NONE SEEN
Bilirubin Urine: NEGATIVE
Glucose, UA: NEGATIVE mg/dL
Hgb urine dipstick: NEGATIVE
Ketones, ur: 5 mg/dL — AB
Nitrite: NEGATIVE
Protein, ur: 30 mg/dL — AB
Specific Gravity, Urine: 1.015 (ref 1.005–1.030)
pH: 5 (ref 5.0–8.0)

## 2023-02-10 LAB — I-STAT ARTERIAL BLOOD GAS, ED
Acid-base deficit: 23 mmol/L — ABNORMAL HIGH (ref 0.0–2.0)
Bicarbonate: 7.8 mmol/L — ABNORMAL LOW (ref 20.0–28.0)
Calcium, Ion: 1.09 mmol/L — ABNORMAL LOW (ref 1.15–1.40)
HCT: 19 % — ABNORMAL LOW (ref 36.0–46.0)
Hemoglobin: 6.5 g/dL — CL (ref 12.0–15.0)
O2 Saturation: 100 %
Patient temperature: 95.4
Potassium: 5.5 mmol/L — ABNORMAL HIGH (ref 3.5–5.1)
Sodium: 139 mmol/L (ref 135–145)
TCO2: 9 mmol/L — ABNORMAL LOW (ref 22–32)
pCO2 arterial: 33.7 mmHg (ref 32–48)
pH, Arterial: 6.958 — CL (ref 7.35–7.45)
pO2, Arterial: 324 mmHg — ABNORMAL HIGH (ref 83–108)

## 2023-02-10 LAB — HEMOGLOBIN AND HEMATOCRIT, BLOOD
HCT: 24.7 % — ABNORMAL LOW (ref 36.0–46.0)
HCT: 26.9 % — ABNORMAL LOW (ref 36.0–46.0)
Hemoglobin: 7 g/dL — ABNORMAL LOW (ref 12.0–15.0)
Hemoglobin: 8.7 g/dL — ABNORMAL LOW (ref 12.0–15.0)

## 2023-02-10 LAB — COMPREHENSIVE METABOLIC PANEL
ALT: 66 U/L — ABNORMAL HIGH (ref 0–44)
AST: 217 U/L — ABNORMAL HIGH (ref 15–41)
Albumin: 3 g/dL — ABNORMAL LOW (ref 3.5–5.0)
Alkaline Phosphatase: 75 U/L (ref 38–126)
Anion gap: 30 — ABNORMAL HIGH (ref 5–15)
BUN: 56 mg/dL — ABNORMAL HIGH (ref 8–23)
CO2: 8 mmol/L — ABNORMAL LOW (ref 22–32)
Calcium: 8.7 mg/dL — ABNORMAL LOW (ref 8.9–10.3)
Chloride: 105 mmol/L (ref 98–111)
Creatinine, Ser: 2.54 mg/dL — ABNORMAL HIGH (ref 0.44–1.00)
GFR, Estimated: 19 mL/min — ABNORMAL LOW (ref >60)
Glucose, Bld: 65 mg/dL — ABNORMAL LOW (ref 70–99)
Potassium: 5.4 mmol/L — ABNORMAL HIGH (ref 3.5–5.1)
Sodium: 143 mmol/L (ref 135–145)
Total Bilirubin: 1.1 mg/dL (ref 0.3–1.2)
Total Protein: 5.8 g/dL — ABNORMAL LOW (ref 6.5–8.1)

## 2023-02-10 LAB — AMMONIA: Ammonia: 161 umol/L — ABNORMAL HIGH (ref 9–35)

## 2023-02-10 LAB — DIC (DISSEMINATED INTRAVASCULAR COAGULATION)PANEL
D-Dimer, Quant: 1.11 ug/mL-FEU — ABNORMAL HIGH (ref 0.00–0.50)
Fibrinogen: 352 mg/dL (ref 210–475)
INR: 2.3 — ABNORMAL HIGH (ref 0.8–1.2)
Platelets: 245 10*3/uL (ref 150–400)
Prothrombin Time: 24.7 seconds — ABNORMAL HIGH (ref 11.4–15.2)
Smear Review: NONE SEEN
aPTT: 34 seconds (ref 24–36)

## 2023-02-10 LAB — TROPONIN I (HIGH SENSITIVITY)
Troponin I (High Sensitivity): 1317 ng/L (ref <18)
Troponin I (High Sensitivity): 1420 ng/L (ref <18)
Troponin I (High Sensitivity): 3630 ng/L (ref <18)

## 2023-02-10 LAB — CBC
HCT: 22.7 % — ABNORMAL LOW (ref 36.0–46.0)
Hemoglobin: 6.6 g/dL — CL (ref 12.0–15.0)
MCH: 26.5 pg (ref 26.0–34.0)
MCHC: 29.1 g/dL — ABNORMAL LOW (ref 30.0–36.0)
MCV: 91.2 fL (ref 80.0–100.0)
Platelets: 244 10*3/uL (ref 150–400)
RBC: 2.49 MIL/uL — ABNORMAL LOW (ref 3.87–5.11)
RDW: 15.8 % — ABNORMAL HIGH (ref 11.5–15.5)
WBC: 19.8 10*3/uL — ABNORMAL HIGH (ref 4.0–10.5)
nRBC: 10.5 % — ABNORMAL HIGH (ref 0.0–0.2)

## 2023-02-10 LAB — RESP PANEL BY RT-PCR (RSV, FLU A&B, COVID)  RVPGX2
Influenza A by PCR: NEGATIVE
Influenza B by PCR: NEGATIVE
Resp Syncytial Virus by PCR: NEGATIVE
SARS Coronavirus 2 by RT PCR: NEGATIVE

## 2023-02-10 LAB — CK: Total CK: 2358 U/L — ABNORMAL HIGH (ref 38–234)

## 2023-02-10 LAB — PROTIME-INR
INR: 2.8 — ABNORMAL HIGH (ref 0.8–1.2)
Prothrombin Time: 29.1 seconds — ABNORMAL HIGH (ref 11.4–15.2)

## 2023-02-10 LAB — APTT: aPTT: 36 seconds (ref 24–36)

## 2023-02-10 LAB — TSH: TSH: 1.293 u[IU]/mL (ref 0.350–4.500)

## 2023-02-10 LAB — MRSA NEXT GEN BY PCR, NASAL: MRSA by PCR Next Gen: DETECTED — AB

## 2023-02-10 LAB — ETHANOL: Alcohol, Ethyl (B): <10 mg/dL (ref <10)

## 2023-02-10 LAB — STREP PNEUMONIAE URINARY ANTIGEN: Strep Pneumo Urinary Antigen: POSITIVE — AB

## 2023-02-10 LAB — ABO/RH: ABO/RH(D): O POS

## 2023-02-10 MED ORDER — VASOPRESSIN 20 UNITS/100 ML INFUSION FOR SHOCK
0.0000 [IU]/min | INTRAVENOUS | Status: DC
  Administered 2023-02-10: 0.03 [IU]/min via INTRAVENOUS
  Administered 2023-02-10: 0.04 [IU]/min via INTRAVENOUS
  Filled 2023-02-10 (×2): qty 100

## 2023-02-10 MED ORDER — ONDANSETRON HCL 4 MG/2ML IJ SOLN: 4.0000 mg | Freq: Four times a day (QID) | INTRAMUSCULAR | Status: DC | PRN

## 2023-02-10 MED ORDER — NOREPINEPHRINE 16 MG/250ML-% IV SOLN
0.0000 ug/min | INTRAVENOUS | Status: DC
  Administered 2023-02-10: 36 ug/min via INTRAVENOUS
  Administered 2023-02-10: 40 ug/min via INTRAVENOUS
  Filled 2023-02-10 (×2): qty 250

## 2023-02-10 MED ORDER — GLYCOPYRROLATE 0.2 MG/ML IJ SOLN
0.2000 mg | INTRAMUSCULAR | Status: DC | PRN
  Administered 2023-02-10 (×2): 0.2 mg via INTRAVENOUS
  Filled 2023-02-10 (×2): qty 1

## 2023-02-10 MED ORDER — PROPOFOL 1000 MG/100ML IV EMUL: 0.0000 ug/kg/min | INTRAVENOUS | Status: DC

## 2023-02-10 MED ORDER — IPRATROPIUM-ALBUTEROL 0.5-2.5 (3) MG/3ML IN SOLN
3.0000 mL | RESPIRATORY_TRACT | Status: DC
  Administered 2023-02-10 (×4): 3 mL via RESPIRATORY_TRACT
  Filled 2023-02-10 (×4): qty 3

## 2023-02-10 MED ORDER — LACTATED RINGERS IV BOLUS (SEPSIS)
1000.0000 mL | Freq: Once | INTRAVENOUS | Status: AC
  Administered 2023-02-10: 1000 mL via INTRAVENOUS

## 2023-02-10 MED ORDER — SODIUM CHLORIDE 0.9% IV SOLUTION: Freq: Once | INTRAVENOUS | Status: DC

## 2023-02-10 MED ORDER — PIPERACILLIN-TAZOBACTAM IN DEX 2-0.25 GM/50ML IV SOLN
2.2500 g | Freq: Three times a day (TID) | INTRAVENOUS | Status: DC
  Filled 2023-02-10: qty 50

## 2023-02-10 MED ORDER — AMIODARONE HCL 200 MG PO TABS: 200.0000 mg | ORAL_TABLET | Freq: Every day | ORAL | Status: DC

## 2023-02-10 MED ORDER — DEXTROSE 50 % IV SOLN
INTRAVENOUS | Status: AC
  Administered 2023-02-10: 50 mL via INTRAVENOUS
  Filled 2023-02-10: qty 50

## 2023-02-10 MED ORDER — POLYETHYLENE GLYCOL 3350 17 G PO PACK: 17.0000 g | PACK | Freq: Every day | ORAL | Status: DC

## 2023-02-10 MED ORDER — SODIUM BICARBONATE 8.4 % IV SOLN
100.0000 meq | Freq: Once | INTRAVENOUS | Status: AC
  Administered 2023-02-10: 100 meq via INTRAVENOUS

## 2023-02-10 MED ORDER — LORAZEPAM 1 MG PO TABS: 1.0000 mg | ORAL_TABLET | ORAL | Status: DC | PRN

## 2023-02-10 MED ORDER — LORAZEPAM 2 MG/ML PO CONC: 1.0000 mg | ORAL | Status: DC | PRN

## 2023-02-10 MED ORDER — ORAL CARE MOUTH RINSE: 15.0000 mL | OROMUCOSAL | Status: DC | PRN

## 2023-02-10 MED ORDER — EPINEPHRINE HCL 5 MG/250ML IV SOLN IN NS
INTRAVENOUS | Status: AC
  Filled 2023-02-10: qty 250

## 2023-02-10 MED ORDER — TRANEXAMIC ACID-NACL 1000-0.7 MG/100ML-% IV SOLN
1000.0000 mg | Freq: Once | INTRAVENOUS | Status: AC
  Administered 2023-02-10: 1000 mg via INTRAVENOUS
  Filled 2023-02-10: qty 100

## 2023-02-10 MED ORDER — POLYVINYL ALCOHOL 1.4 % OP SOLN
1.0000 [drp] | OPHTHALMIC | Status: DC | PRN
  Administered 2023-02-10: 1 [drp] via OPHTHALMIC
  Filled 2023-02-10: qty 15

## 2023-02-10 MED ORDER — LACTULOSE 10 GM/15ML PO SOLN: 30.0000 g | Freq: Two times a day (BID) | ORAL | Status: DC

## 2023-02-10 MED ORDER — FENTANYL CITRATE PF 50 MCG/ML IJ SOSY
25.0000 ug | PREFILLED_SYRINGE | INTRAMUSCULAR | Status: DC | PRN
  Administered 2023-02-10 (×2): 25 ug via INTRAVENOUS
  Filled 2023-02-10 (×2): qty 1

## 2023-02-10 MED ORDER — LORAZEPAM 2 MG/ML IJ SOLN
1.0000 mg | INTRAMUSCULAR | Status: DC | PRN
  Filled 2023-02-10 (×2): qty 1

## 2023-02-10 MED ORDER — DOCUSATE SODIUM 50 MG/5ML PO LIQD: 100.0000 mg | Freq: Two times a day (BID) | ORAL | Status: DC | PRN

## 2023-02-10 MED ORDER — BIOTENE DRY MOUTH MT LIQD: 15.0000 mL | OROMUCOSAL | Status: DC | PRN

## 2023-02-10 MED ORDER — SODIUM BICARBONATE 8.4 % IV SOLN
INTRAVENOUS | Status: AC
  Filled 2023-02-10: qty 100

## 2023-02-10 MED ORDER — NOREPINEPHRINE 4 MG/250ML-% IV SOLN
0.0000 ug/min | INTRAVENOUS | Status: DC
  Administered 2023-02-10: 5 ug/min via INTRAVENOUS
  Administered 2023-02-10: 31 ug/min via INTRAVENOUS
  Filled 2023-02-10: qty 250

## 2023-02-10 MED ORDER — LORAZEPAM 2 MG/ML IJ SOLN
2.0000 mg | INTRAMUSCULAR | Status: DC | PRN
  Administered 2023-02-10 (×2): 2 mg via INTRAVENOUS

## 2023-02-10 MED ORDER — SODIUM CHLORIDE 0.9 % IV SOLN: INTRAVENOUS | Status: DC | PRN

## 2023-02-10 MED ORDER — ACETAMINOPHEN 325 MG PO TABS: 650.0000 mg | ORAL_TABLET | Freq: Four times a day (QID) | ORAL | Status: DC | PRN

## 2023-02-10 MED ORDER — SODIUM CHLORIDE 0.9 % IV SOLN
2.0000 g | Freq: Once | INTRAVENOUS | Status: AC
  Administered 2023-02-10: 2 g via INTRAVENOUS
  Filled 2023-02-10: qty 12.5

## 2023-02-10 MED ORDER — BUSPIRONE HCL 10 MG PO TABS: 15.0000 mg | ORAL_TABLET | Freq: Three times a day (TID) | ORAL | Status: DC

## 2023-02-10 MED ORDER — MIDAZOLAM HCL 2 MG/2ML IJ SOLN
1.0000 mg | INTRAMUSCULAR | Status: DC | PRN
  Administered 2023-02-10 (×2): 2 mg via INTRAVENOUS
  Filled 2023-02-10 (×2): qty 2

## 2023-02-10 MED ORDER — EPINEPHRINE HCL 5 MG/250ML IV SOLN IN NS
0.5000 ug/min | INTRAVENOUS | Status: DC
  Administered 2023-02-10: 2 ug/min via INTRAVENOUS

## 2023-02-10 MED ORDER — PANTOPRAZOLE INFUSION (NEW) - SIMPLE MED
8.0000 mg/h | INTRAVENOUS | Status: DC
  Administered 2023-02-10: 8 mg/h via INTRAVENOUS
  Filled 2023-02-10 (×2): qty 100

## 2023-02-10 MED ORDER — DULOXETINE HCL 60 MG PO CPEP: 60.0000 mg | ORAL_CAPSULE | Freq: Every day | ORAL | Status: DC

## 2023-02-10 MED ORDER — VANCOMYCIN HCL IN DEXTROSE 1-5 GM/200ML-% IV SOLN
1000.0000 mg | Freq: Once | INTRAVENOUS | Status: AC
  Administered 2023-02-10: 1000 mg via INTRAVENOUS
  Filled 2023-02-10: qty 200

## 2023-02-10 MED ORDER — FENTANYL 2500MCG IN NS 250ML (10MCG/ML) PREMIX INFUSION
0.0000 ug/h | INTRAVENOUS | Status: DC
  Administered 2023-02-10: 100 ug/h via INTRAVENOUS
  Filled 2023-02-10 (×2): qty 250

## 2023-02-10 MED ORDER — POLYVINYL ALCOHOL 1.4 % OP SOLN: 1.0000 [drp] | Freq: Four times a day (QID) | OPHTHALMIC | Status: DC | PRN

## 2023-02-10 MED ORDER — DEXTROSE 50 % IV SOLN: 0.0000 mL | INTRAVENOUS | Status: DC | PRN

## 2023-02-10 MED ORDER — ETOMIDATE 2 MG/ML IV SOLN
INTRAVENOUS | Status: DC | PRN
  Administered 2023-02-10: 20 mg via INTRAVENOUS

## 2023-02-10 MED ORDER — LACTATED RINGERS IV BOLUS (SEPSIS): 500.0000 mL | Freq: Once | INTRAVENOUS | Status: DC

## 2023-02-10 MED ORDER — ACETAMINOPHEN 650 MG RE SUPP: 650.0000 mg | Freq: Four times a day (QID) | RECTAL | Status: DC | PRN

## 2023-02-10 MED ORDER — LACTATED RINGERS IV SOLN: INTRAVENOUS | Status: DC

## 2023-02-10 MED ORDER — ROCURONIUM BROMIDE 50 MG/5ML IV SOLN
INTRAVENOUS | Status: DC | PRN
  Administered 2023-02-10: 100 mg via INTRAVENOUS

## 2023-02-10 MED ORDER — ORAL CARE MOUTH RINSE
15.0000 mL | OROMUCOSAL | Status: DC
  Administered 2023-02-10 (×5): 15 mL via OROMUCOSAL

## 2023-02-10 MED ORDER — GLYCOPYRROLATE 1 MG PO TABS: 1.0000 mg | ORAL_TABLET | ORAL | Status: DC | PRN

## 2023-02-10 MED ORDER — INSULIN REGULAR(HUMAN) IN NACL 100-0.9 UT/100ML-% IV SOLN
INTRAVENOUS | Status: DC
  Administered 2023-02-10: 1.9 [IU]/h via INTRAVENOUS
  Filled 2023-02-10: qty 100

## 2023-02-10 MED ORDER — ONDANSETRON 4 MG PO TBDP: 4.0000 mg | ORAL_TABLET | Freq: Four times a day (QID) | ORAL | Status: DC | PRN

## 2023-02-10 MED ORDER — PANTOPRAZOLE SODIUM 40 MG IV SOLR
40.0000 mg | Freq: Two times a day (BID) | INTRAVENOUS | Status: DC
  Administered 2023-02-10: 40 mg via INTRAVENOUS
  Filled 2023-02-10: qty 10

## 2023-02-10 MED ORDER — MIDAZOLAM HCL 2 MG/2ML IJ SOLN
2.0000 mg | Freq: Once | INTRAMUSCULAR | Status: AC
  Administered 2023-02-10: 2 mg via INTRAVENOUS
  Filled 2023-02-10: qty 2

## 2023-02-10 MED ORDER — HYDROCORTISONE SOD SUC (PF) 100 MG IJ SOLR
100.0000 mg | Freq: Two times a day (BID) | INTRAMUSCULAR | Status: DC
  Administered 2023-02-10: 100 mg via INTRAVENOUS
  Filled 2023-02-10: qty 2

## 2023-02-10 MED ORDER — PANTOPRAZOLE 80MG IVPB - SIMPLE MED
80.0000 mg | Freq: Once | INTRAVENOUS | Status: AC
  Administered 2023-02-10: 80 mg via INTRAVENOUS
  Filled 2023-02-10: qty 100

## 2023-02-10 MED ORDER — SODIUM CHLORIDE 0.9 % IV SOLN: 2.0000 g | INTRAVENOUS | Status: DC

## 2023-02-10 MED ORDER — VANCOMYCIN HCL 750 MG/150ML IV SOLN: 750.0000 mg | INTRAVENOUS | Status: DC

## 2023-02-10 MED ORDER — STERILE WATER FOR INJECTION IV SOLN
INTRAVENOUS | Status: DC
  Filled 2023-02-10 (×2): qty 1000

## 2023-02-10 MED ORDER — KCL IN DEXTROSE-NACL 20-5-0.45 MEQ/L-%-% IV SOLN
Freq: Once | INTRAVENOUS | Status: AC
  Filled 2023-02-10: qty 1000

## 2023-02-10 MED ORDER — FENTANYL CITRATE PF 50 MCG/ML IJ SOSY: 25.0000 ug | PREFILLED_SYRINGE | INTRAMUSCULAR | Status: DC | PRN

## 2023-02-10 MED ORDER — CALCIUM GLUCONATE-NACL 2-0.675 GM/100ML-% IV SOLN
2.0000 g | Freq: Once | INTRAVENOUS | Status: AC
  Administered 2023-02-10: 2000 mg via INTRAVENOUS
  Filled 2023-02-10: qty 100

## 2023-02-10 MED ORDER — GLYCOPYRROLATE 0.2 MG/ML IJ SOLN: 0.2000 mg | INTRAMUSCULAR | Status: DC | PRN

## 2023-02-10 MED ORDER — NOREPINEPHRINE 4 MG/250ML-% IV SOLN
0.0000 ug/min | INTRAVENOUS | Status: DC
  Filled 2023-02-10: qty 250

## 2023-02-10 MED ORDER — DEXTROSE 50 % IV SOLN
1.0000 | Freq: Once | INTRAVENOUS | Status: AC
  Filled 2023-02-10: qty 50

## 2023-02-10 MED ORDER — METRONIDAZOLE 500 MG/100ML IV SOLN
500.0000 mg | Freq: Once | INTRAVENOUS | Status: AC
  Administered 2023-02-10: 500 mg via INTRAVENOUS
  Filled 2023-02-10: qty 100

## 2023-02-10 MED ORDER — PIPERACILLIN-TAZOBACTAM 3.375 G IVPB
3.3750 g | Freq: Three times a day (TID) | INTRAVENOUS | Status: DC
  Administered 2023-02-10 (×2): 3.375 g via INTRAVENOUS
  Filled 2023-02-10 (×2): qty 50

## 2023-02-10 MED ORDER — SODIUM CHLORIDE 0.9 % IV SOLN
2.0000 g | Freq: Once | INTRAVENOUS | Status: DC
  Administered 2023-02-10: 2 g via INTRAVENOUS
  Filled 2023-02-10: qty 10

## 2023-02-10 MED ORDER — CHLORHEXIDINE GLUCONATE CLOTH 2 % EX PADS
6.0000 | MEDICATED_PAD | Freq: Every day | CUTANEOUS | Status: DC
  Administered 2023-02-10: 6 via TOPICAL

## 2023-02-10 MED ORDER — DEXMEDETOMIDINE HCL IN NACL 400 MCG/100ML IV SOLN
0.0000 ug/kg/h | INTRAVENOUS | Status: DC
  Administered 2023-02-10: 0.7 ug/kg/h via INTRAVENOUS
  Administered 2023-02-10: 0.6 ug/kg/h via INTRAVENOUS
  Filled 2023-02-10 (×2): qty 100

## 2023-02-10 MED ORDER — DOCUSATE SODIUM 50 MG/5ML PO LIQD: 100.0000 mg | Freq: Two times a day (BID) | ORAL | Status: DC

## 2023-02-10 MED ORDER — POLYETHYLENE GLYCOL 3350 17 G PO PACK: 17.0000 g | PACK | Freq: Every day | ORAL | Status: DC | PRN

## 2023-02-10 MED ORDER — METHYLENE BLUE 1 % INJ SOLN
1.0000 mg/kg | Freq: Once | Status: AC
  Administered 2023-02-10: 110 mg via INTRAVENOUS
  Filled 2023-02-10: qty 11

## 2023-02-10 MED ORDER — INSULIN ASPART 100 UNIT/ML IJ SOLN
0.0000 [IU] | INTRAMUSCULAR | Status: DC
  Administered 2023-02-10: 5 [IU] via SUBCUTANEOUS
  Administered 2023-02-10 (×2): 2 [IU] via SUBCUTANEOUS

## 2023-02-11 LAB — BPAM RBC
Blood Product Expiration Date: 202404182359
Blood Product Expiration Date: 202404182359
Blood Product Expiration Date: 202404182359
Blood Product Expiration Date: 202404182359
ISSUE DATE / TIME: 202403250432
ISSUE DATE / TIME: 202403250702
ISSUE DATE / TIME: 202403251258
ISSUE DATE / TIME: 202403251258
Unit Type and Rh: 5100
Unit Type and Rh: 5100
Unit Type and Rh: 5100
Unit Type and Rh: 5100

## 2023-02-11 LAB — TYPE AND SCREEN
ABO/RH(D): O POS
Antibody Screen: NEGATIVE
Unit division: 0
Unit division: 0
Unit division: 0
Unit division: 0

## 2023-02-11 LAB — LEGIONELLA PNEUMOPHILA SEROGP 1 UR AG: L. pneumophila Serogp 1 Ur Ag: NEGATIVE

## 2023-02-11 LAB — PREPARE FRESH FROZEN PLASMA
Unit division: 0
Unit division: 0

## 2023-02-11 LAB — BPAM FFP
Blood Product Expiration Date: 202403252359
Blood Product Expiration Date: 202403292359
ISSUE DATE / TIME: 202403250802
ISSUE DATE / TIME: 202403250802
Unit Type and Rh: 6200
Unit Type and Rh: 6200

## 2023-02-12 LAB — PATHOLOGIST SMEAR REVIEW

## 2023-02-15 LAB — CULTURE, BLOOD (ROUTINE X 2)
Culture: NO GROWTH
Culture: NO GROWTH
Special Requests: ADEQUATE
Special Requests: ADEQUATE

## 2023-02-17 NOTE — Sepsis Progress Note (Signed)
[facility] following for sepsis protocol. 

## 2023-02-17 NOTE — ED Notes (Signed)
Chem-8 results ran by this Probation officer. Given to EDP.

## 2023-02-17 NOTE — IPAL (Signed)
  Interdisciplinary Goals of Care Family Meeting   Date carried out:: 03/06/23  Location of the meeting: Bedside  [name] involved: Physician, Bedside Registered Nurse, Chaplain, Family Member or next of kin, and Other: PA Luretha Rued Power of Tour manager: daughter & son    Discussion: We discussed goals of care for Avanna Ruesch .  I met with Ms. Kellman's daughter [name] at bedside and her brother via speakerphone with multiple other team members. We reviewed her current care, including need for salvage therapy with methylene blue. I am concerned her lung disease is worsening and she is developing ARDS from her pneumonia. She is in severe MOF. She had septic shock with prolonged recovery about 4 years ago. Both of her children endorse that she would not want to go through that prolonged illness and recovery again.  She recently told her daughter that she was suffering and her quality of life is poor.  Both of her children feel that she would not want to continue aggressive care.  Her son is driving to [facility] to see her.  They both feel that prolonging aggressive care is not what she would want in this situation.  We discussed changing her CODE STATUS and they both agree that DNR is most appropriate.  When her son arrives we will make plans for next steps in her care, likely focusing purely on comfort and withdrawing aggressive care.  Code status: Full DNR  Disposition: Continue current acute care   Time spent for the meeting: 20 min.  Julian Hy 03-06-23, 3:51 PM

## 2023-02-17 NOTE — Progress Notes (Signed)
PCCM Interval Progress Note  Both Dr. Carlis Abbott and I have had an extensive bedside discussion with pt's daughter and son regarding the current circumstances, organ failure, and poor prognosis. We discussed the patient's prior wishes under circumstances such as this.  The family has decided to offer full comfort care for her. They have been fully updated on the process and expectations and all questions have been answered.  Emotional support offered.   See Dr. Annamaria Boots note for more.   Montey Hora, [facility] For pager details, please see AMION or use Epic chat  After 1900, please call [facility] for cross coverage needs 08-Mar-2023, 6:00 PM

## 2023-02-17 NOTE — ED Notes (Signed)
Pt hemoccult results unable to cross into chart. Results read positive for blood in stool. EDP aware @0217 .
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
## 2023-02-17 NOTE — Progress Notes (Signed)
Pt. Transported to CT and back tot 2H 17 without  complications. RN at bedside
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
## 2023-02-17 NOTE — Progress Notes (Signed)
Chaplain responded to call from nurse to attend patient's son and daughter who are in the throes of making the decision to move their mother to comfort care. They are grieving the loss of their mom.  Chaplain provided ministry of presence while the son spoke of the difficulty of having to make this decision while knowing this is the best thing for his mother who has been in pain for some time now.  Chaplain offered prayer with the son and daughter at their mother's bedside.  Chaplain on call throughout the night if a return visit is desired.  Martha Mora

## 2023-02-17 NOTE — Progress Notes (Signed)
Initial Nutrition Assessment  DOCUMENTATION CODES:   Morbid obesity  INTERVENTION:  No plans for initiation of enteral nutrition at this time.  If pt becomes stable enough and appropriate for initiation of enteral nutrition via OG tube recommend (pending confirmation of tube via x-ray): -Initiate Vital 1.5 at 20 mL/hour and advance by 10 mL/hour every 8 hours to goal rate of 50 mL/hour (1200 mL goal daily volume) -Provide PROSource TF20 60 mL daily per tube -Provides: 1880 kcal, 101 grams of protein, 912 mL H2O daily  NUTRITION DIAGNOSIS:   Inadequate oral intake related to inability to eat, acute illness as evidenced by NPO status.  GOAL:   Patient will meet greater than or equal to 90% of their needs  MONITOR:   Vent status, Labs, Weight trends, I & O's  REASON FOR ASSESSMENT:   Ventilator    ASSESSMENT:   77 year old female with PMHx of CAD (stents x 4), OSA, A-fib, HLD, depression/anxiety, HTN, HFpEF, centrilobular emphysema normally on home O2, admitted with encephalopathy, found by neighbors on floor and minimally responsive. Pt found to have multifactorial shock, acute hypoxic respiratory failure s/p intubation on 02-19-2023, strep pneumonia.  Feb 19, 2023: s/p abdominal x-ray and CT abdomen/pelvis (pending final reading)  Pt intubated and sedated. No family members at bedside at time of RD assessment. Discussed with RN. Abdomen distended and concern for feculent output from OG tube. Pending abdominal x-ray and CT abdomen/pelvis and GI consult. No plans for initiation of enteral nutrition at this time. Noted per review of chart family made pt DNR. Son is driving to [place] to see pt. Family will then discuss goals of care when son arrives.   Per review of weight history in chart, appears weight fluctuated between 103-107 kg in the past year. Weight on admission was 109.9 kg (242.29 lbs). This may be falsely elevated by fluids. Will continue to monitor trend.  Patient is currently  intubated on ventilator support MV: 10.9 L/min Temp (24hrs), Avg:97 F (36.1 C), Min:94.6 F (34.8 C), Max:102 F (38.9 C)  Medications reviewed and include: Colace 100 mg BID (held this AM), Miralax 17 grams daily (held this AM), Precedex gtt, epinephrine gtt at 2 mcg/min, fentanyl gtt, regular insulin gtt, LR at 20 mL/hr, norepinephrine gtt at 42 mcg/min, pantoprazole, Zosyn, sodium bicarbonate 150 mEq in sterile water at 125 mL/hr, vancomycin, vasopressin gtt at 0.04 units/min  Labs reviewed: CBG 165-179, CO2 18, BUN 64, Creatinine 2.41  Enteral Access: OGT placed February 19, 2023; enters stomach with intragastric course not filmed per chest x-ray earlier today; still pending final read from abdominal x-ray and CT abdomen/pelvis taken today  UOP: 70 mL + 1 occurrence unmeasured UOP since admission  I/O: +8776 mL since admission  NUTRITION - FOCUSED PHYSICAL EXAM:  Flowsheet Row Most Recent Value  Orbital Region No depletion  Upper Arm Region Unable to assess  [edema]  Thoracic and Lumbar Region No depletion  Buccal Region Unable to assess  Temple Region No depletion  Clavicle Bone Region No depletion  Clavicle and Acromion Bone Region Mild depletion  Scapular Bone Region Unable to assess  Dorsal Hand Unable to assess  [edema]  Patellar Region No depletion  Anterior Thigh Region No depletion  Posterior Calf Region No depletion  Edema (RD Assessment) Moderate  Hair Reviewed  Eyes Unable to assess  Mouth Unable to assess  Skin Reviewed  Nails Reviewed       Diet Order:   Diet Order  Diet NPO time specified  Diet effective now                  EDUCATION NEEDS:   No education needs have been identified at this time  Skin:  Skin Assessment: Skin Integrity Issues: Skin Integrity Issues:: Other (Comment) Other: MASD bilateral groin  Last BM:  2023-03-01 - medium type 4  Height:   Ht Readings from Last 1 Encounters:  01/10/23 5\' 1"  (1.549 m)   Weight:   Wt  Readings from Last 1 Encounters:  2023/03/01 109.9 kg   Ideal Body Weight:  47.7 kg  BMI:  Body mass index is 45.78 kg/m.  Estimated Nutritional Needs:   Kcal:  1800-2000  Protein:  90-100 grams  Fluid:  1.8-2 L/day  Loanne Drilling, MS, RD, LDN, CNSC Pager number available on Amion

## 2023-02-17 NOTE — Progress Notes (Addendum)
NAME:  Martha Mora, MRN:  [id number], DOB:  November 14, 1946, LOS: 0 ADMISSION DATE:  02/16/23, CONSULTATION DATE:  02-16-23 REFERRING MD:  Horton , CHIEF COMPLAINT:  altered.    History of Present Illness:  77 yo woman with CAD (stents x 4), OSA, Afib, HLD, depression/anxiety, HTN, HFpEF, centrilobular emphysema normally on home O2, here with acute encephalopathy, found by neighbors on floor and minimally responsive. EMS noted BS 20s and hypoxic to 80s on their arrival.   Daughter noted on video monitor that she was rearranging things in house, which is unusual and suggested AMS.  Also did not get her usual call from her on Saturday (02/09/24).  Last known normal 02/08/23.    IN ED found to be anemic with hb 5.8, black stool .  Lactate > 9 Ph 6.9/39/123 Hypothermic Received aztreonam,flagyl, cefepime, vanc ordered. Protonix 80mg  ordered D51/2 ns with KCL started.   2L LR given  Glucose 53 AKI  AG acidosis 30 WBC 39.6 Blood cx pending CT head  IMPRESSION: Chronic atrophic and ischemic changes.  No acute abnormality noted.  CXR B opacities predominantly in bases, atelectasis    Recently admitted 2/22-2/23 with afib with rvr. D/c from hosp on plavix, eliquis, and amiodarone per cardiology.   Pertinent  Medical History  Home meds: albuterol, amiodarone, buspar, plavix, cymbalta, eliquis, iron, loratadine, metoprolol, ntg, protonic, lyrica, prolia (injected q 6 months), spiriva, crestor, trazodone, vit D Allergic to pcn, keflex  Significant Hospital Events: Including procedures, antibiotic start and stop dates in addition to other pertinent events   Feb 16, 2023 admit  Interim History / Subjective:  Remains critically ill. Getting 2nd unit PRBC now. Lactate still > 9 Abd distended. Feculent material from OGT. Is able to follow some basic commands this AM.  Objective   Blood pressure (!) 105/44, pulse (!) 104, temperature (!) 97.2 F (36.2 C), resp. rate (!) 28, weight 109.9 kg, SpO2  93 %.    Vent Mode: PRVC FiO2 (%):  [100 %] 100 % Set Rate:  [24 bmp-28 bmp] 28 bmp Vt Set:  [380 mL] 380 mL PEEP:  [5 cmH20] 5 cmH20 Plateau Pressure:  [17 cmH20] 17 cmH20   Intake/Output Summary (Last 24 hours) at February 16, 2023 0745 Last data filed at 2023-02-16 0700 Gross per 24 hour  Intake 4737.99 ml  Output 10 ml  Net 4727.99 ml   Filed Weights   16-Feb-2023 0500  Weight: 109.9 kg    Examination: General: Adult female, critically ill. Neuro: Not sedated, eyes open, follows basic intermittent commands. HEENT: [place]/AT. Sclerae anicteric. ETT in place. Cardiovascular: IRIR, no M/R/G.  Lungs: Respirations even and unlabored.  CTA bilaterally, No W/R/R. Abdomen: Distended. BS hypoactive. Abd soft. Musculoskeletal: No gross deformities, LUE edema. Skin: Pale. Skin warm, no rashes.  Assessment & Plan:   Shock - multifactorial. U.Strep antigen positive but also has melena with +FOBT and anemia with coagulopathy (INR 2.8), currently undergoing 2nd unit PRBC. - Continue empiric abx, change from Vanc/Cefepime to Vanc/Zosyn for extended GI coverage. - Follow cultures. - Continue PRBC resuscitation. - 2u FFP now though consideration that Apixaban can falsely alter INR. - F/u Hgb after this unit blood has completed, anticipate will need additional blood. - Get KUB and might need CT abd/pelvis. - Continue PPI. - Will ask GI to see.  - Continue Levophed, Vasopressin. - Repeat Lactate, troponin. - Continue to hold Plavix, ASA, Apixaban.  Acute hypoxic respiratory failure - s/p intubation. Strep Pneumonia. Hx COPD. - Full vent support. -  Increase RR to 32 if will tolerate. - F/u ABG at 1000. - Abx as above. - BD's. - CXR intermittently.  AGMA - lactate + AKI. AKI. Hypocalcemia. - Continue HCO3, increase to 125/hr. - 2g Ca gluconate. - Repeat labs now.  Hx A.fib, HTN, HLD. - Hold home ASA, Plavix, Amio. - Continue home Amio for now but might need to hold depending on shock  progression etc.  Transaminitis - presumed low flow state/shock liver. - Daily LFT's. - Consider hold Amio.  LUE edema. - Get UE duplex.  Hx Anxiety, Depression. - D/c Buspar, Cymbalta. - Hold home Pregabalin.  Best Practice (right click and "Reselect all SmartList Selections" daily)   Diet/type: NPO DVT prophylaxis: SCD GI prophylaxis: PPI Lines: N/A Foley:  N/A Code Status:  full code Last date of multidisciplinary goals of care discussion []    Additional CC time: 35 min.    Montey Hora, PA - [facility] For pager details, please see AMION or use Epic chat  After 1900, please call [facility] for cross coverage needs March 10, 2023, 8:00 AM   Attending attestation:  MS. Kalisch is a 77 y/o woman with a history of CAD s/p PCI 6 months ago, GERD, HTN, pAF, breast cancer who presented with encephalopathy.  In the ED she was found to be in shock, presumed septic. Overnight she had melena. She is now maxed on 2 vasopressors and escalating  her bicarbonate infusion.  BP (!) 105/44   Pulse (!) 107   Temp 99.6 F (37.6 C) (Oral)   Resp 19   Wt 109.9 kg   SpO2 92% Comment: Simultaneous filing. User may not have seen previous data.  BMI 45.78 kg/m critically ill appearing woman lying in bed intubated, sedated [place]/AT, eyes anicteric RASS -5, PERRL, + cough reflex Abd distended, not tender but still sedated. Brown secretions from NGT. Pplat 30, no rhonchi or rhales. No significant ETT secretions.  Feet and hands warm, not mottled peripherally. +edema.  No diffuse rashes. No bruising.  INR  2.8 Bicarb 18 BUN 64 Cr 2.41 7.25/57/86/25  Pneumococcus antigen positive  Assessment & plan: Acute metabolic encephalopathy, likely 2/2 sepsis -switched off cefepime to pip-tazo -treat sepsis, renal failure -PAD protocol  Septic shock- worry about intraabdominal source.  Bibasilar infiltrates and pneumococcal antigen are consistent with pneumonia in  bilateral lower lobes.  Degree of shock not explained by blood loss that we can observe, but internal bleeding is still possible. No obvious bleeding per my review of CT scan. -Zosyn, vanc -follow cultures -MAP >65 -Worry about source control; CT pending read. Per my review, no free intraabdominal air or obvious loculated source of infection. Bilateral basilar infiltrates may indicate pneumonia. -stress dose steroids -Salvage therapy with methylene blue for vasoplegia. No G6PD level on file.  -bowel rest, hold TF today  Acute respiratory failure with hypoxia requiring MV-- high vent requirements disproportionate to imaging findings ARDS -LTVV- 4-8cc/kg, Pplat currently 30. Metabolic acidosis limits her options for permissive hypercapnia -VAP prevention protocol -Pad protocol for sedation -daily SAT & SBT as appropriate -increased PEEP, escalating FiO2 requirements today  Coagulopathy; on eliquis PTA ABLA- unclear etiology -CT pending -PPI -unsure last time of eliquis dose -TXA today -2 units pRBCs -check DIC panel -Appreciate GI's management- CT pending. Worry about worsening intra-abdominal pathology if there is a perf.  Troponin elevation -EKG -echo -holding all AC due to concern for bleeding  Paroxysmal Afib, currently sinus tach -hold amiodarone; can start infusion if  tachycardia is uncontrolled -holding AC for bleeding  Hyperglycemia -insulin gtt  Persistent lactic acidosis-- worry about mesenteric ishcemia with abdominal distention -repeat LA level -con't bicarb gtt  AKI Unable to advance foley -strict I/O -renally dose meds, avoid nephrotoxic meds -maintain adequate perfusion -urology consulted for foley placement; have been able to empty her bladder with catherization at least  Grim prognosis with worsening shock and pressor requirements. Daughter updted via phone by PA Shearon Stalls; she is coming to the hospital.  This patient is critically ill with multiple  organ system failure which requires frequent high complexity decision making, assessment, support, evaluation, and titration of therapies. This was completed through the application of advanced monitoring technologies and extensive interpretation of multiple databases. During this encounter critical care time was devoted to patient care services described in this note for 46 minutes.   Julian Hy, DO Mar 03, 2023 1:10 PM [facility]  For contact information, see Amion. If no response to pager, please call PCCM consult pager. After hours, 7PM- 7AM, please call [facility].

## 2023-02-17 NOTE — Progress Notes (Signed)
Pharmacy Antibiotic Note  Martha Mora is a 77 y.o. female admitted on 02-26-2023 with  unresponsiveness, possible septic shock .  Pharmacy has been consulted for Vancomycin/Cefepime> change to zosyn for possible intraabdominal infection dosing. WBC is markedly elevated. Hypothermia on admit now 100.8 Noted renal dysfunction.   Plan: Vancomycin 750 mg IV q48h >>>Estimated AUC: 442 Cefepime- stop Zosyn 3.375gm IV q8 EI    Temp (24hrs), Avg:96.8 F (36 C), Min:94.6 F (34.8 C), Max:100.8 F (38.2 C)  Recent Labs  Lab February 26, 2023 0108 2023-02-26 0143 2023/02/26 0430 February 26, 2023 0905 02-26-2023 1159  WBC 39.6*  --   --   --  19.8*  CREATININE 2.54* 2.40*  --  2.41*  --   LATICACIDVEN >9.0*  --  >9.0* >9.0*  --      Estimated Creatinine Clearance: 21.7 mL/min (A) (by C-G formula based on SCr of 2.41 mg/dL (H)).    Allergies  Allergen Reactions   Keflex [Cephalexin] Other (See Comments)    "Made me feel funny"  Tolerated Rocephin in April 2020 over several days and 07/01/19 in ED.   Penicillins Rash and Other (See Comments)    03/01/19 tolerated Zosyn Red bumps all over stomach Did it involve swelling of the face/tongue/throat, SOB, or low BP? No Did it involve sudden or severe rash/hives, skin peeling, or any reaction on the inside of your mouth or nose? Yes Did you need to seek medical attention at a hospital or doctor's office? Yes When did it last happen?      30+ years  If all above answers are "NO", may proceed with cephalosporin use.   Requip Xl [Ropinirole Hcl] Other (See Comments)    Akathisia     Shellfish Allergy Nausea And Vomiting   Bonnita Nasuti Pharm.D. CPP, BCPS Clinical Pharmacist [phone] 02/26/23 3:10 PM

## 2023-02-17 NOTE — Death Summary Note (Signed)
DEATH SUMMARY   Patient Details  Name: Martha Mora MRN: [id number] DOB: 1946-04-26  Admission/Discharge Information   Admit Date:  2023/03/09  Date of Death: Date of Death: 2023/03/09  Time of Death: Time of Death: 1847/03/15  Length of Stay: 0  Referring Physician: Gerrit Heck, MD   Reason(s) for Hospitalization  Septic shock  Diagnoses  Preliminary cause of death:  Secondary Diagnoses (including complications and co-morbidities):  Principal Problem:   Metabolic acidosis Active Problems:   Septic shock ([place])   Encephalopathy   AKI (acute kidney injury) ([place])   Hypoglycemia   Gastrointestinal hemorrhage with melena Septic shock due to pneumococcal pneumonia ARDS Acute respiratory failure with hypoxia and hypercapnia Hyperammonemia Coagulopathy Troponin elevation due to critical illness, demand ischemia Shock liver Hypocalcemia Acute metabolic encephalopathy' Acute blood loss anemia Lactic acidosis History of paroxysmal atrial fibrillation H/o HTN H/o HLD History of anxiety and depression  Brief Hospital Course (including significant findings, care, treatment, and services provided and events leading to death)  [name] is a 77 y.o. year old female  with CAD (stents x 4), OSA, Afib, HLD, depression/anxiety, HTN, HFpEF, centrilobular emphysema normally on home O2, here with acute encephalopathy, found by neighbors on floor and minimally responsive. EMS noted BS 20s and hypoxic to 80s on their arrival.   Daughter noted on video monitor that she was rearranging things in house, which is unusual and suggested AMS.  Also did not get her usual call from her on Saturday (02/09/24).  Last known normal 02/08/23.     IN ED found to be anemic with hb 5.8, black stool .  Lactate > 9 Ph 6.9/39/123 Hypothermic Received aztreonam,flagyl, cefepime, vanc ordered. Protonix 80mg  ordered D51/2 ns with KCL started.   2L LR given  Glucose 53 AKI  AG acidosis 30 WBC  39.6 Blood cx pending CT head  IMPRESSION: Chronic atrophic and ischemic changes.  No acute abnormality noted.   CXR B opacities predominantly in bases, atelectasis    [] trop, PT INR  [] covid  [] UA    Recently admitted 2/22-2/23 with afib with rvr. D/c from hosp on plavix, eliquis, and amiodarone per cardiology.    Despite aggressive management with blood transfusions, antibiotics, escalating pressors she continued to decline. CT did not demonstrate a loculated source of infection or acute source of intra-abdominal pathology to explain her GI symptoms and bleeding. Her family ultimately decided she would not want to continue aggressive care since she had survived septic shock and critical illness previously and she had poor QOL due to declining health at baseline. She passed away with her children at bedside on 03-09-2023.   Pertinent Labs and Studies  Significant Diagnostic Studies VAS [place] UPPER EXTREMITY VENOUS DUPLEX  Result Date: March 09, 2023 UPPER VENOUS STUDY  Patient Name:  Martha Mora  Date of Exam:   2023-03-09 Medical Rec #: [id number]           Accession #:    [id number] Date of Birth: 07-Aug-1946           Patient Gender: F Patient Age:   77 years Exam Location:  [facility] Procedure:      VAS [place] UPPER EXTREMITY VENOUS DUPLEX Referring Phys: Jenny Reichmann PAYNE --------------------------------------------------------------------------------  Indications: Swelling Limitations: VERY difficult and limited study due to patient habitus, extensive subcutaneous edema, and patient positioning and immobilty. Comparison Study: No previous exams Performing Technologist: Jody Hill RVT, RDMS  Examination Guidelines: A complete evaluation includes B-mode imaging, spectral Doppler, color Doppler,  and power Doppler as needed of all accessible portions of each vessel. Bilateral testing is considered an integral part of a complete examination. Limited examinations for reoccurring indications may be performed  as noted.  Right Findings: +----------+------------+---------+-----------+----------+--------------------+ RIGHT     CompressiblePhasicitySpontaneousProperties      Summary        +----------+------------+---------+-----------+----------+--------------------+ Subclavian               Yes       Yes                   patent by                                                              color/doppler     +----------+------------+---------+-----------+----------+--------------------+  Left Findings: +----------+------------+---------+-----------+----------+--------------------+ LEFT      CompressiblePhasicitySpontaneousProperties      Summary        +----------+------------+---------+-----------+----------+--------------------+ IJV                                                    Not visualized    +----------+------------+---------+-----------+----------+--------------------+ Subclavian               Yes       Yes                   patent by                                                              color/doppler     +----------+------------+---------+-----------+----------+--------------------+ Axillary      Full       Yes       Yes                                   +----------+------------+---------+-----------+----------+--------------------+ Brachial      Full       Yes       Yes                                   +----------+------------+---------+-----------+----------+--------------------+ Radial        Full                                                       +----------+------------+---------+-----------+----------+--------------------+ Ulnar                                                  Not visualized    +----------+------------+---------+-----------+----------+--------------------+ Cephalic    Partial  No        Yes               Age Indeterminate    +----------+------------+---------+-----------+----------+--------------------+ Basilic       Full       Yes       Yes                                   +----------+------------+---------+-----------+----------+--------------------+ Cephalic vein seen in forearm only.  Summary:  Right: No evidence of thrombosis in the subclavian.  Left: No evidence of deep vein thrombosis in the upper extremity. Findings consistent with age indeterminate superficial vein thrombosis involving the left cephalic vein (forearm). However, unable to visualize the IJV and ulnar veins. Extensive subcutaneous edema seen throughout.  *See table(s) above for measurements and observations.  Diagnosing physician: Deitra Mayo MD Electronically signed by Deitra Mayo MD on Feb 14, 2023 at 6:33:31 PM.  j    Final    ECHOCARDIOGRAM LIMITED  Result Date: 14-Feb-2023    ECHOCARDIOGRAM LIMITED REPORT   Patient Name:   JASIAH HOUY Date of Exam: Feb 14, 2023 Medical Rec #:  [id number]          Height:       61.0 in Accession #:    [id number]         Weight:       242.3 lb Date of Birth:  1946-09-23          BSA:          2.049 m Patient Age:    25 years           BP:           149/46 mmHg Patient Gender: F                  HR:           111 bpm. Exam Location:  Inpatient Procedure: Limited Echo, Color Doppler and Cardiac Doppler STAT ECHO Indications:    Shock  History:        Patient has prior history of Echocardiogram examinations, most                 recent 01/10/2023.  Sonographer:    Rolla Etienne Referring Phys: [id number] RAHUL P DESAI IMPRESSIONS  1. Left ventricular ejection fraction, by estimation, is 60 to 65%. The left ventricle has normal function. The left ventricle has no regional wall motion abnormalities.  2. Right ventricular systolic function is normal. The right ventricular size is normal.  3. The mitral valve is normal in structure. No evidence of mitral valve regurgitation. No evidence of mitral stenosis.  4.  The aortic valve was not well visualized. There is mild calcification of the aortic valve. There is mild thickening of the aortic valve. Aortic valve regurgitation Not well visualized. No aortic stenosis is present.  5. The inferior vena cava is normal in size with greater than 50% respiratory variability, suggesting right atrial pressure of 3 mmHg. FINDINGS  Left Ventricle: Left ventricular ejection fraction, by estimation, is 60 to 65%. The left ventricle has normal function. The left ventricle has no regional wall motion abnormalities. The left ventricular internal cavity size was normal in size. There is  no left ventricular hypertrophy. Right Ventricle: The right ventricular size is normal. No increase in right ventricular wall thickness. Right ventricular systolic function is normal. Left Atrium: Left atrial size was normal  in size. Right Atrium: Right atrial size was normal in size. Pericardium: There is no evidence of pericardial effusion. Mitral Valve: The mitral valve is normal in structure. Mild mitral annular calcification. No evidence of mitral valve stenosis. Tricuspid Valve: The tricuspid valve is normal in structure. Tricuspid valve regurgitation is not demonstrated. No evidence of tricuspid stenosis. Aortic Valve: The aortic valve was not well visualized. There is mild calcification of the aortic valve. There is mild thickening of the aortic valve. Aortic valve regurgitation Not well visualized. No aortic stenosis is present. Pulmonic Valve: The pulmonic valve was normal in structure. Pulmonic valve regurgitation is not visualized. No evidence of pulmonic stenosis. Aorta: The aortic root is normal in size and structure. Venous: The inferior vena cava is normal in size with greater than 50% respiratory variability, suggesting right atrial pressure of 3 mmHg. IAS/Shunts: No atrial level shunt detected by color flow Doppler. Aditya Sabharwal Electronically signed by Hebert Soho Signature  Date/Time: 02/28/2023/1:45:57 PM    Final    CT ABDOMEN PELVIS WO CONTRAST  Result Date: 02/28/2023 CLINICAL DATA:  Septic shock, distended abdomen EXAM: CT ABDOMEN AND PELVIS WITHOUT CONTRAST TECHNIQUE: Multidetector CT imaging of the abdomen and pelvis was performed following the standard protocol without IV contrast. RADIATION DOSE REDUCTION: This exam was performed according to the departmental dose-optimization program which includes automated exposure control, adjustment of the mA and/or kV according to patient size and/or use of iterative reconstruction technique. COMPARISON:  11/23/2019 FINDINGS: Lower chest: Dependent bibasilar atelectasis or consolidation. Cardiomegaly. Three-vessel coronary artery calcifications. Hepatobiliary: No focal liver abnormality is seen. Status post cholecystectomy. No biliary dilatation. Pancreas: Unremarkable. No pancreatic ductal dilatation or surrounding inflammatory changes. Spleen: Normal in size without significant abnormality. Adrenals/Urinary Tract: Adrenal glands are unremarkable. Small nonobstructive calculus of the inferior pole of the right kidney. No left-sided calculi, ureteral calculi, or hydronephrosis. Bladder is unremarkable. Stomach/Bowel: Esophagogastric tube with tip and side port below the diaphragm. Stomach is within normal limits. Status post appendectomy. No evidence of bowel wall thickening, distention, or inflammatory changes. Sigmoid diverticulosis. Vascular/Lymphatic: Aortic atherosclerosis. No enlarged abdominal or pelvic lymph nodes. Reproductive: Status post hysterectomy. Other: No abdominal wall hernia.  Anasarca.  No ascites. Musculoskeletal: No acute osseous findings. IMPRESSION: 1. No acute noncontrast CT findings of the abdomen or pelvis to explain sepsis. 2. Nonobstructive right nephrolithiasis. 3. Sigmoid diverticulosis without evidence of acute diverticulitis. 4. Anasarca. 5. Dependent bibasilar atelectasis or consolidation of the  included lung bases. 6. Cardiomegaly and coronary artery disease. Aortic Atherosclerosis (ICD10-I70.0). Electronically Signed   By: Delanna Ahmadi M.D.   On: 02-28-2023 10:55   DG Abd 1 View  Result Date: 28-Feb-2023 CLINICAL DATA:  Shock. EXAM: ABDOMEN - 1 VIEW COMPARISON:  03/29/2022, chest x-ray 02-28-23, CT 03/04/2019 FINDINGS: Enteric tube courses to the stomach with tip in the right abdomen likely over the distal stomach. Positive bowel gas present. No free peritoneal air. Minimal bibasilar atelectasis. Remainder of the exam is unchanged. IMPRESSION: Enteric tube with tip in the right abdomen likely over the distal stomach. Electronically Signed   By: Marin Olp M.D.   On: 02/28/2023 08:04   DG Chest Portable 1 View  Result Date: 02/28/23 CLINICAL DATA:  Check intubation status. EXAM: PORTABLE CHEST 1 VIEW COMPARISON:  Portable chest earlier today at 2:44 a.m. FINDINGS: 5:12 a.m. ETT interval pullback, the tip is now 2.7 cm from carina. NGT interval insertion, enters the stomach with the intragastric course not filmed. Interval new left IJ central line.  Based on what is likely antilordotic patient positioning the tube is probably at the brachiocephalic/SVC junction and there is no pneumothorax. The heart is enlarged. Perihilar vascular congestion and again noted central and basilar interstitial edema with small pleural effusions. There is patchy haziness in the left mid and both lower lung fields intermixed with atelectatic bands in the setting of low lung volumes. Findings could all be due to atelectasis or could be a combination of atelectasis, ground-glass edema and pneumonia. No other focal airspace process is seen. There are right axillary surgical clips. Aortic tortuosity and calcification with stable mediastinum. Three level lower cervical ACDF plating and mild thoracic dextroscoliosis. No new osseous findings. IMPRESSION: 1. Support apparatus as above. 2. Cardiomegaly with perihilar  vascular congestion and interstitial edema and small pleural effusions. 3. Patchy haziness in the left mid and both lower lung fields could be a combination of atelectasis, ground-glass edema and pneumonia, or could all be due to atelectasis. 4. No significant change from earlier today. 5. Aortic atherosclerosis.No new findings. Electronically Signed   By: Telford Nab M.D.   On: 2023-02-12 05:49   DG Chest Portable 1 View  Result Date: 02-12-23 CLINICAL DATA:  Check endotracheal tube placement EXAM: PORTABLE CHEST 1 VIEW COMPARISON:  Film from earlier in the same day. FINDINGS: Endotracheal tube is now seen 12 mm above the carina. Cardiac shadow is enlarged but stable. Improved aeration is noted bilaterally when compared with the prior exam. Mild bibasilar atelectasis is noted. IMPRESSION: Endotracheal tube just above the carina. Improved aeration is noted although bibasilar atelectasis remains. Electronically Signed   By: Inez Catalina M.D.   On: February 12, 2023 03:01   CT Head Wo Contrast  Result Date: 02/12/23 CLINICAL DATA:  Altered mental status EXAM: CT HEAD WITHOUT CONTRAST TECHNIQUE: Contiguous axial images were obtained from the base of the skull through the vertex without intravenous contrast. RADIATION DOSE REDUCTION: This exam was performed according to the departmental dose-optimization program which includes automated exposure control, adjustment of the mA and/or kV according to patient size and/or use of iterative reconstruction technique. COMPARISON:  06/02/2020 FINDINGS: Brain: No evidence of acute infarction, hemorrhage, hydrocephalus, extra-axial collection or mass lesion/mass effect. Mild atrophic changes and chronic white matter ischemic changes are seen. Vascular: No hyperdense vessel or unexpected calcification. Skull: Normal. Negative for fracture or focal lesion. Sinuses/Orbits: No acute finding. Other: None. IMPRESSION: Chronic atrophic and ischemic changes.  No acute abnormality  noted. Electronically Signed   By: Inez Catalina M.D.   On: 02-12-23 01:50   DG Chest Portable 1 View  Result Date: 02-12-23 CLINICAL DATA:  Altered mental status EXAM: PORTABLE CHEST 1 VIEW COMPARISON:  01/09/2023 FINDINGS: Cardiac shadow is enlarged. Lungs are well aerated bilaterally. Increasing airspace opacity is noted particularly on the right which may represent pulmonary edema. Some right basilar atelectasis is seen. No bony abnormality is seen. Postsurgical changes in the cervical spine are noted. IMPRESSION: Asymmetric airspace opacities likely representing edema. Electronically Signed   By: Inez Catalina M.D.   On: Feb 12, 2023 01:16   MM 3D SCREEN BREAST BILATERAL  Result Date: 02/05/2023 CLINICAL DATA:  Screening. EXAM: DIGITAL SCREENING BILATERAL MAMMOGRAM WITH TOMOSYNTHESIS AND CAD TECHNIQUE: Bilateral screening digital craniocaudal and mediolateral oblique mammograms were obtained. Bilateral screening digital breast tomosynthesis was performed. The images were evaluated with computer-aided detection. COMPARISON:  Previous exam(s). ACR Breast Density Category b: There are scattered areas of fibroglandular density. FINDINGS: There are no findings suspicious for malignancy. IMPRESSION: No mammographic evidence  of malignancy. A result letter of this screening mammogram will be mailed directly to the patient. RECOMMENDATION: Screening mammogram in one year. (Code:SM-B-01Y) BI-RADS CATEGORY  1: Negative. Electronically Signed   By: Lajean Manes M.D.   On: 02/05/2023 11:47    Microbiology Recent Results (from the past 240 hour(s))  Resp panel by RT-PCR (RSV, Flu A&B, Covid) Anterior Nasal Swab     Status: None   Collection Time: 2023-03-09  2:12 AM   Specimen: Anterior Nasal Swab  Result Value Ref Range Status   SARS Coronavirus 2 by RT PCR NEGATIVE NEGATIVE Final   Influenza A by PCR NEGATIVE NEGATIVE Final   Influenza B by PCR NEGATIVE NEGATIVE Final    Comment: (NOTE) The Xpert Xpress  SARS-CoV-2/FLU/RSV plus assay is intended as an aid in the diagnosis of influenza from Nasopharyngeal swab specimens and should not be used as a sole basis for treatment. Nasal washings and aspirates are unacceptable for Xpert Xpress SARS-CoV-2/FLU/RSV testing.  Fact Sheet for Patients: EntrepreneurPulse.com.au  Fact Sheet for Healthcare Providers: IncredibleEmployment.be  This test is not yet approved or cleared by the [place] FDA and has been authorized for detection and/or diagnosis of SARS-CoV-2 by FDA under an Emergency Use Authorization (EUA). This EUA will remain in effect (meaning this test can be used) for the duration of the COVID-19 declaration under Section 564(b)(1) of the Act, 21 U.S.C. section 360bbb-3(b)(1), unless the authorization is terminated or revoked.     Resp Syncytial Virus by PCR NEGATIVE NEGATIVE Final    Comment: (NOTE) Fact Sheet for Patients: EntrepreneurPulse.com.au  Fact Sheet for Healthcare Providers: IncredibleEmployment.be  This test is not yet approved or cleared by the [place] FDA and has been authorized for detection and/or diagnosis of SARS-CoV-2 by FDA under an Emergency Use Authorization (EUA). This EUA will remain in effect (meaning this test can be used) for the duration of the COVID-19 declaration under Section 564(b)(1) of the Act, 21 U.S.C. section 360bbb-3(b)(1), unless the authorization is terminated or revoked.  Performed at [facility], [place] [address] [place], [place] [address]   MRSA Next Gen by PCR, Nasal     Status: Abnormal   Collection Time: 09-Mar-2023  5:52 AM   Specimen: Nasal Mucosa; Nasal Swab  Result Value Ref Range Status   MRSA by PCR Next Gen DETECTED (A) NOT DETECTED Final    Comment: RESULT CALLED TO, READ BACK BY AND VERIFIED WITH: Arlina Robes RN 2023/03/09 @ 0726 BY AB (NOTE) The GeneXpert MRSA Assay (FDA approved for  NASAL specimens only), is one component of a comprehensive MRSA colonization surveillance program. It is not intended to diagnose MRSA infection nor to guide or monitor treatment for MRSA infections. Test performance is not FDA approved in patients less than 26 years old. Performed at [facility], [place] [address] [place], [place] [address]     Lab Basic Metabolic Panel: Recent Labs  Lab 09-Mar-2023 [id number] 03-09-23 0143 2023/03/09 0210 2023-03-09 0728 2023-03-09 0905 2023/03/09 0929 March 09, 2023 1159 2023/03/09 1602  NA 143 142   < > 139 140 140 141 139  K 5.4* 5.2*   < > 4.7 3.9 4.0 3.7 4.2  CL 105 109  --   --  102  --   --  99  CO2 8*  --   --   --  18*  --   --  25  GLUCOSE 65* 53*  --   --  237*  --   --  160*  BUN 56* 65*  --   --  64*  --   --  71*  CREATININE 2.54* 2.40*  --   --  2.41*  --   --  2.71*  CALCIUM 8.7*  --   --   --  7.8*  --   --  8.1*   < > = values in this interval not displayed.   Liver Function Tests: Recent Labs  Lab March 08, 2023 0108  AST 217*  ALT 66*  ALKPHOS 75  BILITOT 1.1  PROT 5.8*  ALBUMIN 3.0*   No results for input(s): "LIPASE", "AMYLASE" in the last 168 hours. Recent Labs  Lab 03-08-23 0430  AMMONIA 161*   CBC: Recent Labs  Lab 03/08/2023 0108 03-08-2023 0143 2023/03/08 0728 03-08-23 0905 March 08, 2023 0929 08-Mar-2023 1159 March 08, 2023 1355 03/08/23 1557  WBC 39.6*  --   --   --   --  19.8*  --   --   NEUTROABS 35.2*  --   --   --   --   --   --   --   HGB 5.8*   < > 8.8* 7.0* 8.2* 6.6*  8.2*  --  8.7*  HCT 23.8*   < > 26.0* 24.7* 24.0* 22.7*  24.0*  --  26.9*  MCV 105.8*  --   --   --   --  91.2  --   --   PLT 376  --   --   --   --  244 245  --    < > = values in this interval not displayed.   Cardiac Enzymes: Recent Labs  Lab 08-Mar-2023 0108  CKTOTAL 2,358*   Sepsis Labs: Recent Labs  Lab 08-Mar-2023 0108 03/08/2023 0430 2023-03-08 0905 2023-03-08 1159  WBC 39.6*  --   --  19.8*  LATICACIDVEN >9.0* >9.0* >9.0*  --      Procedures/Operations  Intubation CVC A-line   Julian Hy 08-Mar-2023, 7:24 PM

## 2023-02-17 NOTE — Progress Notes (Signed)
Pharmacy Antibiotic Note  Martha Mora is a 77 y.o. female admitted on 03-02-2023 with  unresponsiveness, ?septic shock .  Pharmacy has been consulted for Vancomycin/Cefepime dosing. WBC is markedly elevated. Noted renal dysfunction.   Plan: Vancomycin 750 mg IV q48h >>>Estimated AUC: 442 Cefepime 2g IV q24h Trend WBC, temp, renal function  F/U infectious work-up Drug levels as indicated   Temp (24hrs), Avg:95.3 F (35.2 C), Min:94.7 F (34.8 C), Max:95.5 F (35.3 C)  Recent Labs  Lab 02-Mar-2023 0108 03-02-23 0143  WBC 39.6*  --   CREATININE 2.54* 2.40*  LATICACIDVEN >9.0*  --     CrCl cannot be calculated (Unknown ideal weight.).    Allergies  Allergen Reactions   Keflex [Cephalexin] Other (See Comments)    "Made me feel funny"  Tolerated Rocephin in April 2020 over several days and 07/01/19 in ED.   Penicillins Rash and Other (See Comments)    03/01/19 tolerated Zosyn Red bumps all over stomach Did it involve swelling of the face/tongue/throat, SOB, or low BP? No Did it involve sudden or severe rash/hives, skin peeling, or any reaction on the inside of your mouth or nose? Yes Did you need to seek medical attention at a hospital or doctor's office? Yes When did it last happen?      30+ years  If all above answers are "NO", may proceed with cephalosporin use.   Requip Xl [Ropinirole Hcl] Other (See Comments)    Akathisia     Shellfish Allergy Nausea And Vomiting    Narda Bonds, PharmD, BCPS Clinical Pharmacist Phone: [phone]

## 2023-02-17 NOTE — Progress Notes (Signed)
eLink Physician-Brief Progress Note Patient Name: Dakari Ballantyne DOB: [birth year] MRN: [id number]   Date of Service  Feb 27, 2023  HPI/Events of Note  77/F with hx of afib, CAD s/p multivessel PCI, hypertension, dyslipidemia, found by neighbors minimally rsponsive on the floor.  She was hypoglycemic with glucose in the 20s as per EMS and given glucagon.   She remained somnolent, with anemia hgb 5.8, and metabolic acidosis with pH 6.958.  Pt was subsequently intubated for airway protection.   ABG pH 6.964m pCO2 33.7, pO2 324  eICU Interventions  Continue empiric antibiotics.  Give 187meq NaHCO3.  Start on HCO3 gtt in sterile water to run at 100cc/hr.  Transfuse pRBCs.  Continue PPI gtt.  Continue levophed.  Insert central line.      Intervention Category Evaluation Type: New Patient Evaluation  Elsie Lincoln 02/27/23, 3:57 AM
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
## 2023-02-17 NOTE — Procedures (Signed)
Central Venous Catheter Insertion Procedure Note  Martha Mora  MB:[id number]  [birth year]  Date:14-Feb-2023  Time:5:26 AM   Provider Performing:Ruthell Feigenbaum Duwayne Heck   Procedure: Insertion of Non-tunneled Central Venous Catheter([id number]) with US guidance [id number])   Indication(s) Medication administration  Consent Unable to obtain consent due to emergent nature of procedure.  Anesthesia Topical only with 1% lidocaine   Timeout Verified patient identification, verified procedure, site/side was marked, verified correct patient position, special equipment/implants available, medications/allergies/relevant history reviewed, required imaging and test results available.  Sterile Technique Maximal sterile technique including full sterile barrier drape, hand hygiene, sterile gown, sterile gloves, mask, hair covering, sterile ultrasound probe cover (if used).  Procedure Description Area of catheter insertion was cleaned with chlorhexidine and draped in sterile fashion.  With real-time ultrasound guidance a central venous catheter was placed into the left internal jugular vein. Initiated puncture slightly farther down on neck than typical due to multiple collateral veins overlying the IVC.  Nonpulsatile blood flow and easy flushing noted in all ports.  The catheter was sutured in place and sterile dressing applied.   Complications/Tolerance None; patient tolerated the procedure well. Chest X-ray is ordered to verify placement for internal jugular or subclavian cannulation.   Chest x-ray is not ordered for femoral cannulation.  EBL Minimal  Specimen(s) None

## 2023-02-17 NOTE — Progress Notes (Signed)
Pt extubated to comfort care.  

## 2023-02-17 NOTE — ED Provider Notes (Signed)
INTUBATION Performed by: Antonietta Breach  Required items: required blood products, implants, devices, and special equipment available Patient identity confirmed: provided demographic data and hospital-assigned identification number Time out: Immediately prior to procedure a "time out" was called to verify the correct patient, procedure, equipment, support staff and site/side marked as required.  Indications: AMS  Intubation method: Glidescope Laryngoscopy   Preoxygenation: BVM  Sedatives: 20mg  Etomidate Paralytic: 100mg  Rocuronium  Tube Size: 7.5 cuffed  Post-procedure assessment: chest rise and ETCO2 monitor Breath sounds: equal and absent over the epigastrium Tube secured with: ETT holder Chest x-ray interpreted by radiologist and me.  Chest x-ray findings: endotracheal tube in appropriate position  Patient tolerated the procedure well with no immediate complications.     Antonietta Breach, PA-C 03/10/23 [id number]    Merryl Hacker, MD 02/11/23 [id number]

## 2023-02-17 NOTE — ED Provider Notes (Signed)
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     None         Keshanna Riso, Barbette Hair, MD Feb 28, 2023 [phone]

## 2023-02-17 NOTE — Procedures (Signed)
Arterial Catheter Insertion Procedure Note  Martha Mora  DF:[id number]  Dec 23, 1945  Date:02/16/2023  Time:4:28 AM    Provider Performing: Clance Boll    Procedure: Insertion of Arterial Line [phone]) without US guidance  Indication(s) Blood pressure monitoring and/or need for frequent ABGs  Consent Unable to obtain consent due to emergent nature of procedure.  Anesthesia None   Time Out Verified patient identification, verified procedure, site/side was marked, verified correct patient position, special equipment/implants available, medications/allergies/relevant history reviewed, required imaging and test results available.   Sterile Technique Maximal sterile technique including full sterile barrier drape, hand hygiene, sterile gown, sterile gloves, mask, hair covering, sterile ultrasound probe cover (if used).   Procedure Description Area of catheter insertion was cleaned with chlorhexidine and draped in sterile fashion. Without real-time ultrasound guidance an arterial catheter was placed into the right radial artery.  Appropriate arterial tracings confirmed on monitor.     Complications/Tolerance None; patient tolerated the procedure well.   EBL Minimal   Specimen(s) None

## 2023-02-17 NOTE — Progress Notes (Signed)
LUE venous duplex has been completed.   Results can be found under chart review under CV PROC. 03/03/23 3:56 PM Lupe Handley RVT, RDMS

## 2023-02-17 NOTE — Progress Notes (Signed)
Wasted 250 mL full unopened bag of fentanyl per pharmacy with Ed Blalock, RN in stericycle at [place] on 02/11/23

## 2023-02-17 NOTE — Progress Notes (Signed)
MD to place central line per primary RN.

## 2023-02-17 DEATH — deceased

## 2023-02-26 ENCOUNTER — Ambulatory Visit: Payer: Medicare Other | Admitting: Nurse Practitioner

## 2023-03-07 MED FILL — Medication: Qty: 1 | Status: CN

## 2023-03-28 ENCOUNTER — Other Ambulatory Visit: Payer: Self-pay | Admitting: Family Medicine

## 2023-03-28 DIAGNOSIS — J302 Other seasonal allergic rhinitis: Secondary | ICD-10-CM
